# Patient Record
Sex: Male | Born: 1935 | Race: White | Hispanic: No | Marital: Single | Smoking: Former smoker
Health system: Southern US, Community
[De-identification: ages and names within clinical notes are randomized; demographics above are authoritative.]

## PROBLEM LIST (undated history)

## (undated) DIAGNOSIS — K6289 Other specified diseases of anus and rectum: Secondary | ICD-10-CM

## (undated) DIAGNOSIS — N183 Chronic kidney disease, stage 3 unspecified: Secondary | ICD-10-CM

## (undated) DIAGNOSIS — I2510 Atherosclerotic heart disease of native coronary artery without angina pectoris: Secondary | ICD-10-CM

## (undated) DIAGNOSIS — I491 Atrial premature depolarization: Secondary | ICD-10-CM

## (undated) DIAGNOSIS — K2270 Barrett's esophagus without dysplasia: Secondary | ICD-10-CM

## (undated) DIAGNOSIS — Z85038 Personal history of other malignant neoplasm of large intestine: Secondary | ICD-10-CM

## (undated) DIAGNOSIS — Z8719 Personal history of other diseases of the digestive system: Secondary | ICD-10-CM

## (undated) DIAGNOSIS — I252 Old myocardial infarction: Secondary | ICD-10-CM

## (undated) DIAGNOSIS — Z955 Presence of coronary angioplasty implant and graft: Secondary | ICD-10-CM

## (undated) DIAGNOSIS — K5900 Constipation, unspecified: Secondary | ICD-10-CM

## (undated) DIAGNOSIS — D539 Nutritional anemia, unspecified: Secondary | ICD-10-CM

## (undated) DIAGNOSIS — F32A Depression, unspecified: Secondary | ICD-10-CM

## (undated) DIAGNOSIS — Z973 Presence of spectacles and contact lenses: Secondary | ICD-10-CM

## (undated) DIAGNOSIS — Z87442 Personal history of urinary calculi: Secondary | ICD-10-CM

## (undated) DIAGNOSIS — I208 Other forms of angina pectoris: Secondary | ICD-10-CM

## (undated) DIAGNOSIS — I10 Essential (primary) hypertension: Secondary | ICD-10-CM

## (undated) DIAGNOSIS — I480 Paroxysmal atrial fibrillation: Secondary | ICD-10-CM

## (undated) DIAGNOSIS — F329 Major depressive disorder, single episode, unspecified: Secondary | ICD-10-CM

## (undated) DIAGNOSIS — I350 Nonrheumatic aortic (valve) stenosis: Secondary | ICD-10-CM

## (undated) DIAGNOSIS — E785 Hyperlipidemia, unspecified: Secondary | ICD-10-CM

## (undated) DIAGNOSIS — I2089 Other forms of angina pectoris: Secondary | ICD-10-CM

## (undated) DIAGNOSIS — I493 Ventricular premature depolarization: Secondary | ICD-10-CM

## (undated) DIAGNOSIS — I5022 Chronic systolic (congestive) heart failure: Secondary | ICD-10-CM

## (undated) DIAGNOSIS — I4510 Unspecified right bundle-branch block: Secondary | ICD-10-CM

## (undated) DIAGNOSIS — E119 Type 2 diabetes mellitus without complications: Secondary | ICD-10-CM

## (undated) HISTORY — PX: TRANSTHORACIC ECHOCARDIOGRAM: SHX275

## (undated) HISTORY — PX: CATARACT EXTRACTION W/ INTRAOCULAR LENS  IMPLANT, BILATERAL: SHX1307

## (undated) HISTORY — DX: Hyperlipidemia, unspecified: E78.5

## (undated) HISTORY — DX: Nutritional anemia, unspecified: D53.9

## (undated) HISTORY — DX: Atherosclerotic heart disease of native coronary artery without angina pectoris: I25.10

## (undated) HISTORY — PX: CORONARY ANGIOPLASTY WITH STENT PLACEMENT: SHX49

## (undated) HISTORY — PX: CARDIAC CATHETERIZATION: SHX172

## (undated) HISTORY — PX: LAPAROSCOPIC CHOLECYSTECTOMY: SUR755

## (undated) HISTORY — DX: Essential (primary) hypertension: I10

## (undated) HISTORY — PX: CARDIOVASCULAR STRESS TEST: SHX262

## (undated) HISTORY — DX: Barrett's esophagus without dysplasia: K22.70

## (undated) HISTORY — PX: TONSILLECTOMY: SUR1361

---

## 1991-03-11 HISTORY — PX: RIGHT COLECTOMY: SHX853

## 1991-11-08 HISTORY — PX: ABDOMINAL MASS RESECTION: SHX1110

## 1997-09-27 ENCOUNTER — Other Ambulatory Visit: Admission: RE | Admit: 1997-09-27 | Discharge: 1997-09-27 | Payer: Self-pay | Admitting: Cardiovascular Disease

## 1997-12-04 ENCOUNTER — Ambulatory Visit (HOSPITAL_COMMUNITY): Admission: RE | Admit: 1997-12-04 | Discharge: 1997-12-04 | Payer: Self-pay | Admitting: Urology

## 1998-05-19 ENCOUNTER — Ambulatory Visit (HOSPITAL_COMMUNITY): Admission: RE | Admit: 1998-05-19 | Discharge: 1998-05-19 | Payer: Self-pay | Admitting: Gastroenterology

## 1998-10-16 ENCOUNTER — Emergency Department (HOSPITAL_COMMUNITY): Admission: EM | Admit: 1998-10-16 | Discharge: 1998-10-17 | Payer: Self-pay

## 1999-06-22 ENCOUNTER — Encounter: Admission: RE | Admit: 1999-06-22 | Discharge: 1999-06-22 | Payer: Self-pay | Admitting: Oncology

## 1999-06-22 ENCOUNTER — Encounter: Payer: Self-pay | Admitting: Oncology

## 1999-12-14 ENCOUNTER — Encounter: Payer: Self-pay | Admitting: Oncology

## 1999-12-14 ENCOUNTER — Encounter: Admission: RE | Admit: 1999-12-14 | Discharge: 1999-12-14 | Payer: Self-pay | Admitting: Oncology

## 2002-01-11 ENCOUNTER — Encounter: Payer: Self-pay | Admitting: Emergency Medicine

## 2002-01-11 ENCOUNTER — Emergency Department (HOSPITAL_COMMUNITY): Admission: EM | Admit: 2002-01-11 | Discharge: 2002-01-11 | Payer: Self-pay | Admitting: Emergency Medicine

## 2002-01-12 ENCOUNTER — Emergency Department (HOSPITAL_COMMUNITY): Admission: EM | Admit: 2002-01-12 | Discharge: 2002-01-12 | Payer: Self-pay

## 2002-01-14 ENCOUNTER — Emergency Department (HOSPITAL_COMMUNITY): Admission: EM | Admit: 2002-01-14 | Discharge: 2002-01-14 | Payer: Self-pay | Admitting: *Deleted

## 2002-01-18 ENCOUNTER — Encounter: Payer: Self-pay | Admitting: Urology

## 2002-01-18 ENCOUNTER — Observation Stay (HOSPITAL_COMMUNITY): Admission: RE | Admit: 2002-01-18 | Discharge: 2002-01-19 | Payer: Self-pay | Admitting: Urology

## 2002-07-12 ENCOUNTER — Encounter: Payer: Self-pay | Admitting: Urology

## 2002-07-12 ENCOUNTER — Observation Stay (HOSPITAL_COMMUNITY): Admission: AD | Admit: 2002-07-12 | Discharge: 2002-07-13 | Payer: Self-pay | Admitting: Urology

## 2002-07-13 ENCOUNTER — Encounter: Payer: Self-pay | Admitting: Urology

## 2002-07-18 ENCOUNTER — Ambulatory Visit (HOSPITAL_COMMUNITY): Admission: RE | Admit: 2002-07-18 | Discharge: 2002-07-18 | Payer: Self-pay | Admitting: Gastroenterology

## 2002-10-18 ENCOUNTER — Observation Stay (HOSPITAL_COMMUNITY): Admission: EM | Admit: 2002-10-18 | Discharge: 2002-10-19 | Payer: Self-pay | Admitting: Urology

## 2002-10-18 ENCOUNTER — Encounter: Payer: Self-pay | Admitting: Urology

## 2002-10-18 HISTORY — PX: OTHER SURGICAL HISTORY: SHX169

## 2003-06-12 ENCOUNTER — Inpatient Hospital Stay (HOSPITAL_COMMUNITY): Admission: EM | Admit: 2003-06-12 | Discharge: 2003-06-15 | Payer: Self-pay | Admitting: Emergency Medicine

## 2003-07-02 ENCOUNTER — Emergency Department (HOSPITAL_COMMUNITY): Admission: EM | Admit: 2003-07-02 | Discharge: 2003-07-02 | Payer: Self-pay | Admitting: Emergency Medicine

## 2003-07-09 ENCOUNTER — Encounter (INDEPENDENT_AMBULATORY_CARE_PROVIDER_SITE_OTHER): Payer: Self-pay | Admitting: Specialist

## 2003-07-09 ENCOUNTER — Inpatient Hospital Stay (HOSPITAL_COMMUNITY): Admission: RE | Admit: 2003-07-09 | Discharge: 2003-07-16 | Payer: Self-pay | Admitting: General Surgery

## 2003-07-09 HISTORY — PX: OTHER SURGICAL HISTORY: SHX169

## 2004-06-09 ENCOUNTER — Ambulatory Visit: Payer: Self-pay | Admitting: Oncology

## 2004-08-26 ENCOUNTER — Observation Stay (HOSPITAL_COMMUNITY): Admission: EM | Admit: 2004-08-26 | Discharge: 2004-08-27 | Payer: Self-pay | Admitting: Emergency Medicine

## 2005-03-05 ENCOUNTER — Encounter: Admission: RE | Admit: 2005-03-05 | Discharge: 2005-03-05 | Payer: Self-pay | Admitting: Internal Medicine

## 2005-06-15 ENCOUNTER — Ambulatory Visit: Payer: Self-pay | Admitting: Oncology

## 2005-10-18 ENCOUNTER — Ambulatory Visit: Payer: Self-pay | Admitting: Oncology

## 2005-10-20 LAB — CBC WITH DIFFERENTIAL/PLATELET
BASO%: 0.9 % (ref 0.0–2.0)
Eosinophils Absolute: 0.2 10*3/uL (ref 0.0–0.5)
LYMPH%: 15.5 % (ref 14.0–48.0)
MCHC: 34.5 g/dL (ref 32.0–35.9)
MCV: 103.1 fL — ABNORMAL HIGH (ref 81.6–98.0)
MONO#: 0.6 10*3/uL (ref 0.1–0.9)
MONO%: 10.3 % (ref 0.0–13.0)
NEUT#: 3.7 10*3/uL (ref 1.5–6.5)
RBC: 3.45 10*6/uL — ABNORMAL LOW (ref 4.20–5.71)
RDW: 13.3 % (ref 11.2–14.6)
WBC: 5.4 10*3/uL (ref 4.0–10.0)

## 2005-10-20 LAB — MORPHOLOGY: PLT EST: ADEQUATE

## 2005-12-01 LAB — CBC WITH DIFFERENTIAL/PLATELET
BASO%: 0.6 % (ref 0.0–2.0)
EOS%: 5 % (ref 0.0–7.0)
MCH: 35.7 pg — ABNORMAL HIGH (ref 28.0–33.4)
MCHC: 34.8 g/dL (ref 32.0–35.9)
MONO#: 0.3 10*3/uL (ref 0.1–0.9)
RBC: 3.39 10*6/uL — ABNORMAL LOW (ref 4.20–5.71)
WBC: 4.2 10*3/uL (ref 4.0–10.0)
lymph#: 0.7 10*3/uL — ABNORMAL LOW (ref 0.9–3.3)

## 2005-12-01 LAB — MORPHOLOGY: PLT EST: ADEQUATE

## 2005-12-01 LAB — CHCC SMEAR

## 2005-12-02 LAB — VITAMIN B12: Vitamin B-12: 315 pg/mL (ref 211–911)

## 2006-01-21 ENCOUNTER — Emergency Department (HOSPITAL_COMMUNITY): Admission: EM | Admit: 2006-01-21 | Discharge: 2006-01-21 | Payer: Self-pay | Admitting: Emergency Medicine

## 2006-06-09 ENCOUNTER — Ambulatory Visit: Payer: Self-pay | Admitting: Oncology

## 2006-06-14 LAB — CBC WITH DIFFERENTIAL/PLATELET
Basophils Absolute: 0 10*3/uL (ref 0.0–0.1)
HCT: 34.5 % — ABNORMAL LOW (ref 38.7–49.9)
HGB: 12 g/dL — ABNORMAL LOW (ref 13.0–17.1)
MONO#: 0.4 10*3/uL (ref 0.1–0.9)
NEUT%: 64.4 % (ref 40.0–75.0)
WBC: 4 10*3/uL (ref 4.0–10.0)
lymph#: 0.8 10*3/uL — ABNORMAL LOW (ref 0.9–3.3)

## 2006-06-14 LAB — COMPREHENSIVE METABOLIC PANEL
ALT: 12 U/L (ref 0–53)
BUN: 22 mg/dL (ref 6–23)
CO2: 24 mEq/L (ref 19–32)
Calcium: 9.3 mg/dL (ref 8.4–10.5)
Chloride: 107 mEq/L (ref 96–112)
Creatinine, Ser: 1.07 mg/dL (ref 0.40–1.50)

## 2006-06-14 LAB — LACTATE DEHYDROGENASE: LDH: 152 U/L (ref 94–250)

## 2006-06-14 LAB — PSA: PSA: 2.87 ng/mL (ref 0.10–4.00)

## 2006-12-15 ENCOUNTER — Ambulatory Visit: Payer: Self-pay | Admitting: Oncology

## 2006-12-20 LAB — MORPHOLOGY: PLT EST: ADEQUATE

## 2006-12-20 LAB — CBC & DIFF AND RETIC
Basophils Absolute: 0 10*3/uL (ref 0.0–0.1)
EOS%: 3.1 % (ref 0.0–7.0)
Eosinophils Absolute: 0.2 10*3/uL (ref 0.0–0.5)
HGB: 13.3 g/dL (ref 13.0–17.1)
LYMPH%: 15.5 % (ref 14.0–48.0)
MCH: 35.7 pg — ABNORMAL HIGH (ref 28.0–33.4)
MCV: 101.5 fL — ABNORMAL HIGH (ref 81.6–98.0)
MONO%: 8.1 % (ref 0.0–13.0)
NEUT#: 4 10*3/uL (ref 1.5–6.5)
Platelets: 178 10*3/uL (ref 145–400)
RDW: 13.7 % (ref 11.2–14.6)

## 2007-04-30 ENCOUNTER — Emergency Department (HOSPITAL_COMMUNITY): Admission: EM | Admit: 2007-04-30 | Discharge: 2007-05-01 | Payer: Self-pay | Admitting: Emergency Medicine

## 2007-06-09 ENCOUNTER — Ambulatory Visit: Payer: Self-pay | Admitting: Oncology

## 2007-06-14 LAB — LACTATE DEHYDROGENASE: LDH: 184 U/L (ref 94–250)

## 2007-06-14 LAB — COMPREHENSIVE METABOLIC PANEL
ALT: 22 U/L (ref 0–53)
Albumin: 4.4 g/dL (ref 3.5–5.2)
CO2: 23 mEq/L (ref 19–32)
Calcium: 9.2 mg/dL (ref 8.4–10.5)
Chloride: 101 mEq/L (ref 96–112)
Creatinine, Ser: 0.96 mg/dL (ref 0.40–1.50)
Potassium: 3.7 mEq/L (ref 3.5–5.3)
Total Protein: 7.2 g/dL (ref 6.0–8.3)

## 2007-06-14 LAB — CBC & DIFF AND RETIC
BASO%: 0.5 % (ref 0.0–2.0)
EOS%: 5.2 % (ref 0.0–7.0)
HCT: 39.7 % (ref 38.7–49.9)
IRF: 0.39 — ABNORMAL HIGH (ref 0.070–0.380)
MCH: 33.8 pg — ABNORMAL HIGH (ref 28.0–33.4)
MCHC: 33.8 g/dL (ref 32.0–35.9)
MONO%: 7 % (ref 0.0–13.0)
NEUT%: 70.8 % (ref 40.0–75.0)
RDW: 13.4 % (ref 11.2–14.6)
lymph#: 0.8 10*3/uL — ABNORMAL LOW (ref 0.9–3.3)

## 2007-06-14 LAB — MORPHOLOGY: PLT EST: ADEQUATE

## 2008-06-03 ENCOUNTER — Ambulatory Visit (HOSPITAL_COMMUNITY): Admission: RE | Admit: 2008-06-03 | Discharge: 2008-06-03 | Payer: Self-pay | Admitting: Oncology

## 2008-06-03 ENCOUNTER — Ambulatory Visit: Payer: Self-pay | Admitting: Oncology

## 2008-06-11 LAB — CBC WITH DIFFERENTIAL/PLATELET
Eosinophils Absolute: 0.4 10*3/uL (ref 0.0–0.5)
MONO#: 0.6 10*3/uL (ref 0.1–0.9)
NEUT#: 4.9 10*3/uL (ref 1.5–6.5)
Platelets: 194 10*3/uL (ref 145–400)
RBC: 4.42 10*6/uL (ref 4.20–5.71)
RDW: 12.8 % (ref 11.2–14.6)
WBC: 7.1 10*3/uL (ref 4.0–10.0)

## 2008-06-11 LAB — COMPREHENSIVE METABOLIC PANEL
Albumin: 4.2 g/dL (ref 3.5–5.2)
CO2: 28 mEq/L (ref 19–32)
Chloride: 101 mEq/L (ref 96–112)
Glucose, Bld: 118 mg/dL — ABNORMAL HIGH (ref 70–99)
Potassium: 4.3 mEq/L (ref 3.5–5.3)
Sodium: 141 mEq/L (ref 135–145)
Total Protein: 7.1 g/dL (ref 6.0–8.3)

## 2008-06-11 LAB — LACTATE DEHYDROGENASE: LDH: 156 U/L (ref 94–250)

## 2008-06-11 LAB — CEA: CEA: 1.1 ng/mL (ref 0.0–5.0)

## 2009-06-06 ENCOUNTER — Ambulatory Visit: Payer: Self-pay | Admitting: Oncology

## 2009-06-10 LAB — COMPREHENSIVE METABOLIC PANEL
ALT: 24 U/L (ref 0–53)
AST: 27 U/L (ref 0–37)
Albumin: 3.9 g/dL (ref 3.5–5.2)
Glucose, Bld: 213 mg/dL — ABNORMAL HIGH (ref 70–99)
Potassium: 3.8 mEq/L (ref 3.5–5.3)
Total Protein: 7.1 g/dL (ref 6.0–8.3)

## 2009-06-10 LAB — CBC WITH DIFFERENTIAL/PLATELET
BASO%: 0.5 % (ref 0.0–2.0)
Basophils Absolute: 0 10*3/uL (ref 0.0–0.1)
EOS%: 3.7 % (ref 0.0–7.0)
Eosinophils Absolute: 0.2 10*3/uL (ref 0.0–0.5)
LYMPH%: 19.1 % (ref 14.0–49.0)
MCV: 102.2 fL — ABNORMAL HIGH (ref 79.3–98.0)
MONO#: 0.5 10*3/uL (ref 0.1–0.9)
MONO%: 8.1 % (ref 0.0–14.0)
NEUT%: 68.6 % (ref 39.0–75.0)
RDW: 12.8 % (ref 11.0–14.6)
lymph#: 1.2 10*3/uL (ref 0.9–3.3)

## 2009-06-10 LAB — LACTATE DEHYDROGENASE: LDH: 142 U/L (ref 94–250)

## 2009-10-31 ENCOUNTER — Encounter: Admission: RE | Admit: 2009-10-31 | Discharge: 2009-10-31 | Payer: Self-pay | Admitting: Internal Medicine

## 2009-11-25 ENCOUNTER — Ambulatory Visit (HOSPITAL_COMMUNITY)
Admission: RE | Admit: 2009-11-25 | Discharge: 2009-11-25 | Payer: Self-pay | Source: Home / Self Care | Admitting: Surgery

## 2009-12-04 ENCOUNTER — Encounter (INDEPENDENT_AMBULATORY_CARE_PROVIDER_SITE_OTHER): Payer: Self-pay | Admitting: Surgery

## 2009-12-04 ENCOUNTER — Ambulatory Visit (HOSPITAL_COMMUNITY): Admission: RE | Admit: 2009-12-04 | Discharge: 2009-12-05 | Payer: Self-pay | Admitting: Surgery

## 2009-12-12 ENCOUNTER — Ambulatory Visit: Payer: Self-pay | Admitting: Oncology

## 2009-12-16 LAB — CBC WITH DIFFERENTIAL/PLATELET
BASO%: 0.7 % (ref 0.0–2.0)
EOS%: 6.6 % (ref 0.0–7.0)
Eosinophils Absolute: 0.4 10*3/uL (ref 0.0–0.5)
HCT: 36.5 % — ABNORMAL LOW (ref 38.4–49.9)
HGB: 12.8 g/dL — ABNORMAL LOW (ref 13.0–17.1)
LYMPH%: 11.1 % — ABNORMAL LOW (ref 14.0–49.0)
MCH: 35.4 pg — ABNORMAL HIGH (ref 27.2–33.4)
MCHC: 35 g/dL (ref 32.0–36.0)
MONO#: 0.5 10*3/uL (ref 0.1–0.9)
MONO%: 8.5 % (ref 0.0–14.0)
NEUT%: 73.1 % (ref 39.0–75.0)
Platelets: 202 10*3/uL (ref 140–400)

## 2009-12-16 LAB — CEA: CEA: 1.4 ng/mL (ref 0.0–5.0)

## 2009-12-23 ENCOUNTER — Ambulatory Visit (HOSPITAL_COMMUNITY): Admission: RE | Admit: 2009-12-23 | Discharge: 2009-12-23 | Payer: Self-pay | Admitting: Oncology

## 2010-01-22 ENCOUNTER — Ambulatory Visit: Payer: Self-pay | Admitting: Cardiovascular Disease

## 2010-01-22 ENCOUNTER — Inpatient Hospital Stay (HOSPITAL_COMMUNITY): Admission: EM | Admit: 2010-01-22 | Discharge: 2010-01-23 | Payer: Self-pay | Admitting: Internal Medicine

## 2010-01-29 ENCOUNTER — Ambulatory Visit (HOSPITAL_BASED_OUTPATIENT_CLINIC_OR_DEPARTMENT_OTHER): Payer: MEDICARE | Admitting: Oncology

## 2010-02-02 LAB — CBC & DIFF AND RETIC
BASO%: 0.4 % (ref 0.0–2.0)
Basophils Absolute: 0 10*3/uL (ref 0.0–0.1)
Eosinophils Absolute: 0.4 10*3/uL (ref 0.0–0.5)
HCT: 36.8 % — ABNORMAL LOW (ref 38.4–49.9)
Immature Retic Fract: 10.5 % (ref 0.00–13.40)
MCH: 34.2 pg — ABNORMAL HIGH (ref 27.2–33.4)
MCHC: 33.7 g/dL (ref 32.0–36.0)
MONO%: 7.4 % (ref 0.0–14.0)
NEUT%: 71.2 % (ref 39.0–75.0)
Platelets: 176 10*3/uL (ref 140–400)
Retic %: 1.21 % (ref 0.50–1.60)
Retic Ct Abs: 43.92 10*3/uL (ref 24.10–77.50)
WBC: 5.5 10*3/uL (ref 4.0–10.3)
nRBC: 0 % (ref 0–0)

## 2010-02-02 LAB — MORPHOLOGY

## 2010-02-04 LAB — IMMUNOFIXATION ELECTROPHORESIS
IgG (Immunoglobin G), Serum: 1030 mg/dL (ref 694–1618)
IgM, Serum: 97 mg/dL (ref 60–263)
Total Protein, Serum Electrophoresis: 6.6 g/dL (ref 6.0–8.3)

## 2010-02-04 LAB — FERRITIN: Ferritin: 100 ng/mL (ref 22–322)

## 2010-02-04 LAB — IRON AND TIBC: %SAT: 33 % (ref 20–55)

## 2010-02-13 ENCOUNTER — Ambulatory Visit: Payer: Self-pay | Admitting: Cardiovascular Disease

## 2010-04-20 ENCOUNTER — Encounter
Admission: RE | Admit: 2010-04-20 | Discharge: 2010-04-20 | Payer: Self-pay | Source: Home / Self Care | Attending: Internal Medicine | Admitting: Internal Medicine

## 2010-04-29 ENCOUNTER — Encounter
Admission: RE | Admit: 2010-04-29 | Discharge: 2010-04-29 | Payer: Self-pay | Source: Home / Self Care | Attending: Internal Medicine | Admitting: Internal Medicine

## 2010-06-11 ENCOUNTER — Encounter: Payer: MEDICARE | Admitting: Oncology

## 2010-06-11 DIAGNOSIS — Z85038 Personal history of other malignant neoplasm of large intestine: Secondary | ICD-10-CM

## 2010-06-11 DIAGNOSIS — C61 Malignant neoplasm of prostate: Secondary | ICD-10-CM

## 2010-06-11 LAB — CBC WITH DIFFERENTIAL/PLATELET
BASO%: 0.5 % (ref 0.0–2.0)
EOS%: 4.4 % (ref 0.0–7.0)
Eosinophils Absolute: 0.2 10*3/uL (ref 0.0–0.5)
HGB: 12.8 g/dL — ABNORMAL LOW (ref 13.0–17.1)
MCH: 35.2 pg — ABNORMAL HIGH (ref 27.2–33.4)
MCHC: 34.4 g/dL (ref 32.0–36.0)
MONO#: 0.5 10*3/uL (ref 0.1–0.9)
NEUT#: 3.5 10*3/uL (ref 1.5–6.5)
NEUT%: 66.6 % (ref 39.0–75.0)
WBC: 5.2 10*3/uL (ref 4.0–10.3)
lymph#: 1 10*3/uL (ref 0.9–3.3)

## 2010-06-11 LAB — COMPREHENSIVE METABOLIC PANEL
Alkaline Phosphatase: 41 U/L (ref 39–117)
BUN: 29 mg/dL — ABNORMAL HIGH (ref 6–23)
Calcium: 9.6 mg/dL (ref 8.4–10.5)
Glucose, Bld: 216 mg/dL — ABNORMAL HIGH (ref 70–99)
Total Bilirubin: 0.7 mg/dL (ref 0.3–1.2)

## 2010-06-16 ENCOUNTER — Encounter (HOSPITAL_BASED_OUTPATIENT_CLINIC_OR_DEPARTMENT_OTHER): Payer: MEDICARE | Admitting: Oncology

## 2010-06-16 DIAGNOSIS — D539 Nutritional anemia, unspecified: Secondary | ICD-10-CM

## 2010-06-16 DIAGNOSIS — R634 Abnormal weight loss: Secondary | ICD-10-CM

## 2010-06-16 DIAGNOSIS — Z85038 Personal history of other malignant neoplasm of large intestine: Secondary | ICD-10-CM

## 2010-06-22 ENCOUNTER — Other Ambulatory Visit: Payer: Self-pay | Admitting: Oncology

## 2010-06-25 LAB — UIFE/LIGHT CHAINS/TP QN, 24-HR UR
Free Kappa Lt Chains,Ur: 1.61 mg/dL — ABNORMAL HIGH (ref 0.04–1.51)
Free Lambda Excretion/Day: 1.63 mg/d
Free Lt Chn Excr Rate: 20.13 mg/d
Total Protein, Urine-Ur/day: 31 mg/d (ref 10–140)
Total Protein, Urine: 2.5 mg/dL
Volume, Urine: 1250 mL

## 2010-06-25 LAB — CREATININE CLEARANCE, URINE, 24 HOUR
Collection Interval-CRCL: 24 hours
Creatinine, 24H Ur: 1048 mg/d (ref 800–2000)
Creatinine, Urine: 83.8 mg/dL
Creatinine: 1.54 mg/dL — ABNORMAL HIGH (ref 0.40–1.50)

## 2010-07-23 LAB — GLUCOSE, CAPILLARY
Glucose-Capillary: 131 mg/dL — ABNORMAL HIGH (ref 70–99)
Glucose-Capillary: 96 mg/dL (ref 70–99)

## 2010-07-23 LAB — PROTIME-INR
INR: 1.05 (ref 0.00–1.49)
Prothrombin Time: 13.9 seconds (ref 11.6–15.2)

## 2010-07-23 LAB — URINALYSIS, DIPSTICK ONLY
Hgb urine dipstick: NEGATIVE
Leukocytes, UA: NEGATIVE
Protein, ur: NEGATIVE mg/dL
Urobilinogen, UA: 0.2 mg/dL (ref 0.0–1.0)

## 2010-07-23 LAB — CBC
HCT: 35.5 % — ABNORMAL LOW (ref 39.0–52.0)
Hemoglobin: 12.1 g/dL — ABNORMAL LOW (ref 13.0–17.0)
MCH: 34.7 pg — ABNORMAL HIGH (ref 26.0–34.0)
MCV: 101.7 fL — ABNORMAL HIGH (ref 78.0–100.0)
RDW: 13.3 % (ref 11.5–15.5)

## 2010-07-23 LAB — COMPREHENSIVE METABOLIC PANEL
AST: 28 U/L (ref 0–37)
Albumin: 3.6 g/dL (ref 3.5–5.2)
Calcium: 9.1 mg/dL (ref 8.4–10.5)
Creatinine, Ser: 1.38 mg/dL (ref 0.4–1.5)
GFR calc Af Amer: 59 mL/min — ABNORMAL LOW (ref >60)
Total Protein: 6.3 g/dL (ref 6.0–8.3)

## 2010-07-23 LAB — BASIC METABOLIC PANEL
BUN: 23 mg/dL (ref 6–23)
GFR calc non Af Amer: 58 mL/min — ABNORMAL LOW (ref >60)
Glucose, Bld: 110 mg/dL — ABNORMAL HIGH (ref 70–99)
Potassium: 4.7 mEq/L (ref 3.5–5.1)

## 2010-07-23 LAB — TROPONIN I
Troponin I: 0.04 ng/mL (ref 0.00–0.06)
Troponin I: 0.04 ng/mL (ref 0.00–0.06)

## 2010-07-23 LAB — CK TOTAL AND CKMB (NOT AT ARMC)
CK, MB: 2.3 ng/mL (ref 0.3–4.0)
Relative Index: 1.3 (ref 0.0–2.5)

## 2010-07-23 LAB — CARDIAC PANEL(CRET KIN+CKTOT+MB+TROPI)
CK, MB: 2.8 ng/mL (ref 0.3–4.0)
Relative Index: 1.8 (ref 0.0–2.5)

## 2010-07-23 LAB — APTT: aPTT: 30 seconds (ref 24–37)

## 2010-07-25 LAB — URINALYSIS, ROUTINE W REFLEX MICROSCOPIC
Glucose, UA: >1000 mg/dL — AB
Hgb urine dipstick: NEGATIVE
Leukocytes, UA: NEGATIVE
Specific Gravity, Urine: 1.021 (ref 1.005–1.030)
Urobilinogen, UA: 0.2 mg/dL (ref 0.0–1.0)

## 2010-07-25 LAB — DIFFERENTIAL
Lymphocytes Relative: 14 % (ref 12–46)
Monocytes Absolute: 0.4 10*3/uL (ref 0.1–1.0)
Monocytes Relative: 6 % (ref 3–12)
Neutro Abs: 4.5 10*3/uL (ref 1.7–7.7)

## 2010-07-25 LAB — CBC
MCH: 35.8 pg — ABNORMAL HIGH (ref 26.0–34.0)
MCHC: 34.6 g/dL (ref 30.0–36.0)
MCV: 103.4 fL — ABNORMAL HIGH (ref 78.0–100.0)
Platelets: 172 10*3/uL (ref 150–400)
RDW: 12.9 % (ref 11.5–15.5)
WBC: 6.3 10*3/uL (ref 4.0–10.5)

## 2010-07-25 LAB — COMPREHENSIVE METABOLIC PANEL
Albumin: 3.7 g/dL (ref 3.5–5.2)
BUN: 20 mg/dL (ref 6–23)
Calcium: 9.5 mg/dL (ref 8.4–10.5)
Creatinine, Ser: 1.22 mg/dL (ref 0.4–1.5)
Total Protein: 6.7 g/dL (ref 6.0–8.3)

## 2010-07-25 LAB — URINE MICROSCOPIC-ADD ON

## 2010-07-25 LAB — GLUCOSE, CAPILLARY
Glucose-Capillary: 120 mg/dL — ABNORMAL HIGH (ref 70–99)
Glucose-Capillary: 127 mg/dL — ABNORMAL HIGH (ref 70–99)
Glucose-Capillary: 80 mg/dL (ref 70–99)

## 2010-07-25 LAB — BLOOD GAS, ARTERIAL
Acid-base deficit: 4.2 mmol/L — ABNORMAL HIGH (ref 0.0–2.0)
Drawn by: 277341
Inspiratory PAP: 10
pCO2 arterial: 47.1 mmHg — ABNORMAL HIGH (ref 35.0–45.0)
pH, Arterial: 7.28 — ABNORMAL LOW (ref 7.350–7.450)
pO2, Arterial: 94.1 mmHg (ref 80.0–100.0)

## 2010-07-26 LAB — SURGICAL PCR SCREEN
MRSA, PCR: NEGATIVE
Staphylococcus aureus: NEGATIVE

## 2010-07-26 LAB — URINALYSIS, ROUTINE W REFLEX MICROSCOPIC
Bilirubin Urine: NEGATIVE
Nitrite: NEGATIVE
Specific Gravity, Urine: 1.022 (ref 1.005–1.030)
Urobilinogen, UA: 0.2 mg/dL (ref 0.0–1.0)
pH: 5.5 (ref 5.0–8.0)

## 2010-07-26 LAB — DIFFERENTIAL
Basophils Relative: 1 % (ref 0–1)
Lymphocytes Relative: 14 % (ref 12–46)
Lymphs Abs: 0.8 10*3/uL (ref 0.7–4.0)
Monocytes Absolute: 0.5 10*3/uL (ref 0.1–1.0)
Monocytes Relative: 9 % (ref 3–12)
Neutro Abs: 4.4 10*3/uL (ref 1.7–7.7)
Neutrophils Relative %: 72 % (ref 43–77)

## 2010-07-26 LAB — CBC
MCHC: 34.5 g/dL (ref 30.0–36.0)
MCV: 103.6 fL — ABNORMAL HIGH (ref 78.0–100.0)
Platelets: 163 10*3/uL (ref 150–400)
RDW: 13.1 % (ref 11.5–15.5)
WBC: 6.1 10*3/uL (ref 4.0–10.5)

## 2010-07-26 LAB — COMPREHENSIVE METABOLIC PANEL
Albumin: 3.9 g/dL (ref 3.5–5.2)
Alkaline Phosphatase: 43 U/L (ref 39–117)
BUN: 28 mg/dL — ABNORMAL HIGH (ref 6–23)
Calcium: 9.6 mg/dL (ref 8.4–10.5)
Glucose, Bld: 244 mg/dL — ABNORMAL HIGH (ref 70–99)
Potassium: 5 mEq/L (ref 3.5–5.1)
Total Protein: 7 g/dL (ref 6.0–8.3)

## 2010-07-28 ENCOUNTER — Encounter: Payer: Self-pay | Admitting: Cardiovascular Disease

## 2010-07-28 ENCOUNTER — Ambulatory Visit
Admission: RE | Admit: 2010-07-28 | Discharge: 2010-07-28 | Disposition: A | Discharge status: Home / Self Care | Payer: MEDICARE | Source: Ambulatory Visit | Attending: Cardiology | Admitting: Cardiology

## 2010-07-28 ENCOUNTER — Encounter: Payer: Self-pay | Admitting: Cardiology

## 2010-07-28 ENCOUNTER — Other Ambulatory Visit: Payer: Self-pay | Admitting: Cardiology

## 2010-07-28 ENCOUNTER — Ambulatory Visit (INDEPENDENT_AMBULATORY_CARE_PROVIDER_SITE_OTHER): Payer: MEDICARE | Admitting: Cardiology

## 2010-07-28 DIAGNOSIS — Z79899 Other long term (current) drug therapy: Secondary | ICD-10-CM

## 2010-07-28 DIAGNOSIS — E785 Hyperlipidemia, unspecified: Secondary | ICD-10-CM | POA: Insufficient documentation

## 2010-07-28 DIAGNOSIS — R0602 Shortness of breath: Secondary | ICD-10-CM

## 2010-07-28 DIAGNOSIS — I10 Essential (primary) hypertension: Secondary | ICD-10-CM | POA: Insufficient documentation

## 2010-07-28 DIAGNOSIS — R079 Chest pain, unspecified: Secondary | ICD-10-CM | POA: Insufficient documentation

## 2010-07-28 DIAGNOSIS — I2510 Atherosclerotic heart disease of native coronary artery without angina pectoris: Secondary | ICD-10-CM

## 2010-07-28 LAB — COMPREHENSIVE METABOLIC PANEL
ALT: 13 U/L (ref 0–53)
Albumin: 4.3 g/dL (ref 3.5–5.2)
Alkaline Phosphatase: 42 U/L (ref 39–117)
CO2: 21 mEq/L (ref 19–32)
Potassium: 4.6 mEq/L (ref 3.5–5.3)
Sodium: 139 mEq/L (ref 135–145)
Total Bilirubin: 0.5 mg/dL (ref 0.3–1.2)
Total Protein: 6.9 g/dL (ref 6.0–8.3)

## 2010-07-28 LAB — CBC WITH DIFFERENTIAL/PLATELET
Eosinophils Absolute: 0.2 10*3/uL (ref 0.0–0.7)
Hemoglobin: 12.6 g/dL — ABNORMAL LOW (ref 13.0–17.0)
Lymphs Abs: 1 10*3/uL (ref 0.7–4.0)
MCH: 34.1 pg — ABNORMAL HIGH (ref 26.0–34.0)
Monocytes Relative: 9 % (ref 3–12)
Neutro Abs: 4.5 10*3/uL (ref 1.7–7.7)
Neutrophils Relative %: 72 % (ref 43–77)
Platelets: 204 10*3/uL (ref 150–400)
RBC: 3.69 MIL/uL — ABNORMAL LOW (ref 4.22–5.81)
WBC: 6.2 10*3/uL (ref 4.0–10.5)

## 2010-07-28 NOTE — Progress Notes (Signed)
Subjective: [name] is seen for Dr. Elease Hashimoto today. He is complaining of exertional chest pain. It is located above his ribs. He has become more short of breath. He has had no symptoms at rest. He has not tried NTG. Symptoms have been progressive over the past week. He has had MRI and CT scan over the last 6 months with subsequent cholecystectomy. All of symptoms are below diaphraghm.  He has had hoarseness as well.  Current Outpatient Prescriptions  Medication Sig Dispense Refill  . aspirin 81 MG tablet Take 81 mg by mouth daily.        . Cholecalciferol (VITAMIN D-3 PO) Take by mouth daily.        [name] glimepiride (AMARYL) 4 MG tablet Take 4 mg by mouth daily before breakfast.        . lisinopril-hydrochlorothiazide (PRINZIDE,ZESTORETIC) 20-12.5 MG per tablet Take 1 tablet by mouth daily.        . metoprolol (TOPROL-XL) 50 MG 24 hr tablet Take 50 mg by mouth daily.        . Multiple Vitamin (MULTIVITAMIN) tablet Take 1 tablet by mouth daily.        . simvastatin (ZOCOR) 40 MG tablet Take 40 mg by mouth at bedtime.        [name] Glycol-Propyl Glycol (SYSTANE OP) Apply to eye 4 (four) times daily.          No Known Allergies  Patient Active Problem List  Diagnoses  . CAD (coronary artery disease)  . Hypertension  . Diabetes mellitus  . Hyperlipidemia  . Coronary artery disease    History  Smoking status  . Former Smoker  . Quit date: 05/10/1994  Smokeless tobacco  . Not on file    History  Alcohol Use No    Family History  Problem Relation Age of Onset  . Cancer Mother   . Ulcers Father     Review of Systems: As above. He denies edema. He is mostly now limited by his shortness of breath. No weight gain or weight loss.  The patient denies headaches or blurry vision. He has had some cough, but no real productive sputum.  The patient denies dizziness.  All other systems were reviewed and are negative.   Physical Exam: Vital signs are noted. He is pleasant and in no  acute distress. HEENT is unremarkable. Skin is warm and dry. Color is normal. Lungs are clear. Cardiac exam shows a regular rate and rhythm. He has frequent ectopics. He has no edema. Gait and ROM are intact. He has no gross neurologic deficits. He does sound a little hoarse today.  Assessment / Plan:

## 2010-07-28 NOTE — Assessment & Plan Note (Signed)
His pain is somewhat atypical.His last stress test dates back to 2003.  We will check labs, cxr and arrange for a Lexiscan. We will give him a prescription NTG to use prn. We will continue with his other meds. He will follow up with Dr. Elease Hashimoto after his stress test.

## 2010-07-29 LAB — BRAIN NATRIURETIC PEPTIDE: Brain Natriuretic Peptide: 48.1 pg/mL (ref 0.0–100.0)

## 2010-07-30 ENCOUNTER — Telehealth: Payer: Self-pay | Admitting: *Deleted

## 2010-07-30 NOTE — Telephone Encounter (Signed)
Informed pt regarding his lab work;  He is requesting his lexiscan be moved up if at all possible.  RN will contact [facility] tomorrow to see if this can be done.  I instructed pt I would call back in the AM if this can be done.

## 2010-08-01 ENCOUNTER — Inpatient Hospital Stay (HOSPITAL_COMMUNITY)
Admission: EM | Admit: 2010-08-01 | Discharge: 2010-08-06 | DRG: 247 | Disposition: A | Discharge status: Home / Self Care | Payer: Medicare Other | Attending: Cardiovascular Disease | Admitting: Cardiovascular Disease

## 2010-08-01 ENCOUNTER — Emergency Department (HOSPITAL_COMMUNITY): Payer: Medicare Other

## 2010-08-01 DIAGNOSIS — I2510 Atherosclerotic heart disease of native coronary artery without angina pectoris: Principal | ICD-10-CM | POA: Diagnosis present

## 2010-08-01 DIAGNOSIS — E119 Type 2 diabetes mellitus without complications: Secondary | ICD-10-CM | POA: Diagnosis present

## 2010-08-01 DIAGNOSIS — Z79899 Other long term (current) drug therapy: Secondary | ICD-10-CM

## 2010-08-01 DIAGNOSIS — K2270 Barrett's esophagus without dysplasia: Secondary | ICD-10-CM | POA: Diagnosis present

## 2010-08-01 DIAGNOSIS — I10 Essential (primary) hypertension: Secondary | ICD-10-CM | POA: Diagnosis present

## 2010-08-01 DIAGNOSIS — Z85038 Personal history of other malignant neoplasm of large intestine: Secondary | ICD-10-CM

## 2010-08-01 DIAGNOSIS — I200 Unstable angina: Secondary | ICD-10-CM | POA: Diagnosis present

## 2010-08-01 DIAGNOSIS — N289 Disorder of kidney and ureter, unspecified: Secondary | ICD-10-CM | POA: Diagnosis present

## 2010-08-01 DIAGNOSIS — Z7982 Long term (current) use of aspirin: Secondary | ICD-10-CM

## 2010-08-01 LAB — COMPREHENSIVE METABOLIC PANEL
AST: 24 U/L (ref 0–37)
Albumin: 3.5 g/dL (ref 3.5–5.2)
Calcium: 8.6 mg/dL (ref 8.4–10.5)
Chloride: 109 mEq/L (ref 96–112)
Creatinine, Ser: 1.35 mg/dL (ref 0.4–1.5)
GFR calc Af Amer: >60 mL/min (ref >60)
Total Bilirubin: 0.5 mg/dL (ref 0.3–1.2)
Total Protein: 6.3 g/dL (ref 6.0–8.3)

## 2010-08-01 LAB — POCT I-STAT, CHEM 8
BUN: 37 mg/dL — ABNORMAL HIGH (ref 6–23)
Calcium, Ion: 1.13 mmol/L (ref 1.12–1.32)
Creatinine, Ser: 1.6 mg/dL — ABNORMAL HIGH (ref 0.4–1.5)
Hemoglobin: 11.2 g/dL — ABNORMAL LOW (ref 13.0–17.0)
TCO2: 21 mmol/L (ref 0–100)

## 2010-08-01 LAB — CK TOTAL AND CKMB (NOT AT ARMC)
CK, MB: 2.7 ng/mL (ref 0.3–4.0)
Relative Index: 1.5 (ref 0.0–2.5)

## 2010-08-01 LAB — PROTIME-INR
INR: 1.17 (ref 0.00–1.49)
Prothrombin Time: 15.1 seconds (ref 11.6–15.2)

## 2010-08-01 LAB — CBC
MCH: 34.1 pg — ABNORMAL HIGH (ref 26.0–34.0)
MCHC: 34 g/dL (ref 30.0–36.0)
Platelets: 165 10*3/uL (ref 150–400)
RDW: 12.6 % (ref 11.5–15.5)

## 2010-08-01 LAB — POCT CARDIAC MARKERS
Troponin i, poc: <0.05 ng/mL (ref 0.00–0.09)
Troponin i, poc: <0.05 ng/mL (ref 0.00–0.09)

## 2010-08-01 LAB — MRSA PCR SCREENING: MRSA by PCR: NEGATIVE

## 2010-08-01 LAB — DIFFERENTIAL
Basophils Relative: 1 % (ref 0–1)
Eosinophils Absolute: 0.2 10*3/uL (ref 0.0–0.7)
Eosinophils Relative: 4 % (ref 0–5)
Monocytes Absolute: 0.7 10*3/uL (ref 0.1–1.0)
Monocytes Relative: 15 % — ABNORMAL HIGH (ref 3–12)

## 2010-08-01 LAB — APTT: aPTT: 104 seconds — ABNORMAL HIGH (ref 24–37)

## 2010-08-02 DIAGNOSIS — I200 Unstable angina: Secondary | ICD-10-CM

## 2010-08-02 LAB — GLUCOSE, CAPILLARY
Glucose-Capillary: 109 mg/dL — ABNORMAL HIGH (ref 70–99)
Glucose-Capillary: 229 mg/dL — ABNORMAL HIGH (ref 70–99)

## 2010-08-02 LAB — CARDIAC PANEL(CRET KIN+CKTOT+MB+TROPI)
CK, MB: 2.9 ng/mL (ref 0.3–4.0)
Relative Index: 1.2 (ref 0.0–2.5)
Total CK: 232 U/L (ref 7–232)
Total CK: 222 U/L (ref 7–232)
Troponin I: 0.03 ng/mL (ref 0.00–0.06)

## 2010-08-02 LAB — CBC
HCT: 33.4 % — ABNORMAL LOW (ref 39.0–52.0)
Hemoglobin: 11.4 g/dL — ABNORMAL LOW (ref 13.0–17.0)
RBC: 3.3 MIL/uL — ABNORMAL LOW (ref 4.22–5.81)
WBC: 4.8 10*3/uL (ref 4.0–10.5)

## 2010-08-02 LAB — LIPID PANEL: Cholesterol: 106 mg/dL (ref 0–200)

## 2010-08-02 LAB — HEPARIN LEVEL (UNFRACTIONATED): Heparin Unfractionated: 0.31 IU/mL (ref 0.30–0.70)

## 2010-08-03 DIAGNOSIS — I2510 Atherosclerotic heart disease of native coronary artery without angina pectoris: Secondary | ICD-10-CM

## 2010-08-03 LAB — GLUCOSE, CAPILLARY: Glucose-Capillary: 151 mg/dL — ABNORMAL HIGH (ref 70–99)

## 2010-08-03 LAB — BASIC METABOLIC PANEL
BUN: 20 mg/dL (ref 6–23)
Calcium: 8.7 mg/dL (ref 8.4–10.5)
GFR calc non Af Amer: 57 mL/min — ABNORMAL LOW (ref >60)
Glucose, Bld: 98 mg/dL (ref 70–99)
Potassium: 3.8 mEq/L (ref 3.5–5.1)

## 2010-08-03 LAB — CBC
MCH: 34.2 pg — ABNORMAL HIGH (ref 26.0–34.0)
MCV: 100.6 fL — ABNORMAL HIGH (ref 78.0–100.0)
Platelets: 157 10*3/uL (ref 150–400)
RBC: 3.39 MIL/uL — ABNORMAL LOW (ref 4.22–5.81)
RDW: 12.6 % (ref 11.5–15.5)
WBC: 5.4 10*3/uL (ref 4.0–10.5)

## 2010-08-03 LAB — HEPARIN LEVEL (UNFRACTIONATED): Heparin Unfractionated: 0.31 IU/mL (ref 0.30–0.70)

## 2010-08-03 LAB — BRAIN NATRIURETIC PEPTIDE: Pro B Natriuretic peptide (BNP): 99 pg/mL (ref 0.0–100.0)

## 2010-08-04 ENCOUNTER — Inpatient Hospital Stay (HOSPITAL_COMMUNITY): Payer: Medicare Other

## 2010-08-04 ENCOUNTER — Other Ambulatory Visit: Payer: Self-pay | Admitting: Gastroenterology

## 2010-08-04 ENCOUNTER — Encounter: Payer: Self-pay | Admitting: Cardiovascular Disease

## 2010-08-04 LAB — BASIC METABOLIC PANEL
BUN: 16 mg/dL (ref 6–23)
CO2: 24 mEq/L (ref 19–32)
Calcium: 9 mg/dL (ref 8.4–10.5)
Chloride: 109 mEq/L (ref 96–112)
GFR calc Af Amer: >60 mL/min (ref >60)
Glucose, Bld: 137 mg/dL — ABNORMAL HIGH (ref 70–99)
Potassium: 3.9 mEq/L (ref 3.5–5.1)

## 2010-08-04 LAB — D-DIMER, QUANTITATIVE: D-Dimer, Quant: 0.52 ug/mL-FEU — ABNORMAL HIGH (ref 0.00–0.48)

## 2010-08-04 LAB — CBC
Hemoglobin: 11.9 g/dL — ABNORMAL LOW (ref 13.0–17.0)
Platelets: 159 10*3/uL (ref 150–400)
RBC: 3.48 MIL/uL — ABNORMAL LOW (ref 4.22–5.81)

## 2010-08-04 LAB — GLUCOSE, CAPILLARY: Glucose-Capillary: 192 mg/dL — ABNORMAL HIGH (ref 70–99)

## 2010-08-05 ENCOUNTER — Inpatient Hospital Stay (HOSPITAL_COMMUNITY): Payer: Medicare Other

## 2010-08-05 DIAGNOSIS — Z955 Presence of coronary angioplasty implant and graft: Secondary | ICD-10-CM

## 2010-08-05 HISTORY — DX: Presence of coronary angioplasty implant and graft: Z95.5

## 2010-08-05 LAB — BASIC METABOLIC PANEL
CO2: 26 mEq/L (ref 19–32)
Chloride: 108 mEq/L (ref 96–112)
Creatinine, Ser: 1.24 mg/dL (ref 0.4–1.5)
GFR calc Af Amer: >60 mL/min (ref >60)
Sodium: 137 mEq/L (ref 135–145)

## 2010-08-05 LAB — GLUCOSE, CAPILLARY
Glucose-Capillary: 110 mg/dL — ABNORMAL HIGH (ref 70–99)
Glucose-Capillary: 119 mg/dL — ABNORMAL HIGH (ref 70–99)
Glucose-Capillary: 110 mg/dL — ABNORMAL HIGH (ref 70–99)
Glucose-Capillary: 232 mg/dL — ABNORMAL HIGH (ref 70–99)

## 2010-08-05 LAB — CBC
Hemoglobin: 12.2 g/dL — ABNORMAL LOW (ref 13.0–17.0)
MCH: 34.4 pg — ABNORMAL HIGH (ref 26.0–34.0)
RBC: 3.55 MIL/uL — ABNORMAL LOW (ref 4.22–5.81)

## 2010-08-06 LAB — BASIC METABOLIC PANEL
BUN: 19 mg/dL (ref 6–23)
CO2: 23 mEq/L (ref 19–32)
Chloride: 105 mEq/L (ref 96–112)
Creatinine, Ser: 1.29 mg/dL (ref 0.4–1.5)

## 2010-08-06 LAB — CBC
MCH: 32.3 pg (ref 26.0–34.0)
MCV: 99.1 fL (ref 78.0–100.0)
Platelets: 139 10*3/uL — ABNORMAL LOW (ref 150–400)
RDW: 12.6 % (ref 11.5–15.5)

## 2010-08-06 LAB — GLUCOSE, CAPILLARY: Glucose-Capillary: 206 mg/dL — ABNORMAL HIGH (ref 70–99)

## 2010-08-07 ENCOUNTER — Telehealth: Payer: Self-pay | Admitting: Cardiovascular Disease

## 2010-08-07 NOTE — Telephone Encounter (Signed)
PRIOR AUTH CALLED FOR BRILINTA, PENDING

## 2010-08-07 NOTE — Telephone Encounter (Signed)
CONFIRM THAT PT TAKING ASA 81MG , PT CONFIRMED THAT DOSE IS CORRECT. Cydney Ok RN

## 2010-08-09 ENCOUNTER — Emergency Department (HOSPITAL_COMMUNITY): Payer: Medicare Other

## 2010-08-09 ENCOUNTER — Ambulatory Visit (HOSPITAL_COMMUNITY)
Admission: EM | Admit: 2010-08-09 | Discharge: 2010-08-10 | Disposition: A | Discharge status: Home / Self Care | Payer: Medicare Other | Attending: Cardiovascular Disease | Admitting: Cardiovascular Disease

## 2010-08-09 DIAGNOSIS — R079 Chest pain, unspecified: Secondary | ICD-10-CM

## 2010-08-09 DIAGNOSIS — K2270 Barrett's esophagus without dysplasia: Secondary | ICD-10-CM | POA: Insufficient documentation

## 2010-08-09 DIAGNOSIS — Z79899 Other long term (current) drug therapy: Secondary | ICD-10-CM | POA: Insufficient documentation

## 2010-08-09 DIAGNOSIS — Z01810 Encounter for preprocedural cardiovascular examination: Secondary | ICD-10-CM | POA: Insufficient documentation

## 2010-08-09 DIAGNOSIS — R0602 Shortness of breath: Secondary | ICD-10-CM | POA: Insufficient documentation

## 2010-08-09 DIAGNOSIS — Z01818 Encounter for other preprocedural examination: Secondary | ICD-10-CM | POA: Insufficient documentation

## 2010-08-09 DIAGNOSIS — Z9861 Coronary angioplasty status: Secondary | ICD-10-CM | POA: Insufficient documentation

## 2010-08-09 DIAGNOSIS — Z01812 Encounter for preprocedural laboratory examination: Secondary | ICD-10-CM | POA: Insufficient documentation

## 2010-08-09 DIAGNOSIS — I2510 Atherosclerotic heart disease of native coronary artery without angina pectoris: Secondary | ICD-10-CM | POA: Insufficient documentation

## 2010-08-09 DIAGNOSIS — E785 Hyperlipidemia, unspecified: Secondary | ICD-10-CM | POA: Insufficient documentation

## 2010-08-09 DIAGNOSIS — E119 Type 2 diabetes mellitus without complications: Secondary | ICD-10-CM | POA: Insufficient documentation

## 2010-08-09 DIAGNOSIS — I10 Essential (primary) hypertension: Secondary | ICD-10-CM | POA: Insufficient documentation

## 2010-08-09 LAB — CBC
Hemoglobin: 11.5 g/dL — ABNORMAL LOW (ref 13.0–17.0)
MCH: 34.6 pg — ABNORMAL HIGH (ref 26.0–34.0)
MCHC: 34.6 g/dL (ref 30.0–36.0)

## 2010-08-09 LAB — BASIC METABOLIC PANEL
BUN: 21 mg/dL (ref 6–23)
Chloride: 105 mEq/L (ref 96–112)
Glucose, Bld: 135 mg/dL — ABNORMAL HIGH (ref 70–99)
Potassium: 3.8 mEq/L (ref 3.5–5.1)

## 2010-08-09 LAB — DIFFERENTIAL
Basophils Relative: 0 % (ref 0–1)
Eosinophils Relative: 4 % (ref 0–5)
Monocytes Absolute: 0.4 10*3/uL (ref 0.1–1.0)
Monocytes Relative: 7 % (ref 3–12)
Neutro Abs: 4 10*3/uL (ref 1.7–7.7)

## 2010-08-09 LAB — CK TOTAL AND CKMB (NOT AT ARMC)
CK, MB: 2 ng/mL (ref 0.3–4.0)
Total CK: 116 U/L (ref 7–232)

## 2010-08-09 MED ORDER — IOHEXOL 300 MG/ML  SOLN
100.0000 mL | Freq: Once | INTRAMUSCULAR | Status: AC | PRN
  Administered 2010-08-09: 100 mL via INTRAVENOUS

## 2010-08-10 DIAGNOSIS — I2510 Atherosclerotic heart disease of native coronary artery without angina pectoris: Secondary | ICD-10-CM

## 2010-08-10 LAB — CK TOTAL AND CKMB (NOT AT ARMC): Total CK: 127 U/L (ref 7–232)

## 2010-08-10 LAB — GLUCOSE, CAPILLARY: Glucose-Capillary: 135 mg/dL — ABNORMAL HIGH (ref 70–99)

## 2010-08-11 NOTE — Op Note (Signed)
NAME:  [name], [name] NO.:  [id number]  MEDICAL RECORD NO.:  [id number]           PATIENT TYPE:  LOCATION:                                 FACILITY:  PHYSICIAN:  Graylin Shiver, M.D.   DATE OF BIRTH:  1923/12/25  DATE OF PROCEDURE:  08/04/2010 DATE OF DISCHARGE:                              OPERATIVE REPORT   INDICATIONS FOR PROCEDURE:  The patient is an 77 year old male who was admitted to the hospital with exertional chest pain.  A cardiac cath was done which showed a 60% to 70% lesion in one of his coronary arteries and it was felt this might be the cause of this problem.  However, Cardiology wanted to make sure he did not have a GI cause of this pain because the pain was also in the upper abdomen area.  It was also felt that an upper GI source needed to be ruled out because if he had a cardiac cath again with a stent placement he would need to be on Plavix.  Informed consent was obtained after explanation of the risks of bleeding, infection, and perforation.  PREMEDICATION: 1. Fentanyl 50 mcg IV. 2. Versed 5 mg IV.  PROCEDURE:  With the patient in the left lateral decubitus position, the Pentax gastroscope was inserted into the oropharynx.  I initially tried the adult gastroscope but could not get it down into the esophagus because of the angulation.  I therefore used a pediatric gastroscope and was able to get it down without any difficulty.  The scope was passed down into the stomach and then into the duodenum.  The second portion and bulb of the duodenum were normal.  The stomach looked normal in its entirety.  The esophagus revealed what appeared to be a long segment of Barrett esophagus and I estimated the length of this to be approximately 13 cm.  The actual esophagogastric junction should have been at around 40 cm but I actually saw the squamocolumnar junction at 27 cm up in the tubular esophagus.  A few biopsies were obtained to confirm or  disprove Barrett's.  I did not see any evidence of any ulcers, erosions, or other abnormalities in the esophagus which would make me think this was the source of pain.  He tolerated this procedure well without complications.  IMPRESSION:  Suspected long segment Barrett esophagus.  Biopsies were obtained.  PLAN:  Check biopsies for Barrett's.  I would recommend using a proton pump inhibitor therapy, and if he has a cardiac coronary stent placed it would be safe to use Dexilant or Protonix along with Plavix.  Given his age, I would not recommend routine Barrett surveillance every few years.  I doubt that the findings on this upper endoscopy would be the source of his exertional chest pain. .          ______________________________ Graylin Shiver, M.D.     SFG/MEDQ  D:  08/04/2010  T:  08/05/2010  Job:  [id number]  cc:   Vesta Mixer, M.D. John C. Madilyn Fireman, M.D. Genene Churn. Cyndie Chime, M.D., F.A.C.P.  Electronically Signed by Herbert Moors MD on 08/11/2010  04:38:59 PM

## 2010-08-11 NOTE — Procedures (Signed)
  [name], [name] NO.:  [id number]  MEDICAL RECORD NO.:  [id number]           PATIENT TYPE:  O  LOCATION:  2009                         FACILITY:  [facility]  PHYSICIAN:  Peter M. Swaziland, M.D.  DATE OF BIRTH:  01-Feb-1924  DATE OF PROCEDURE:  08/10/2010 DATE OF DISCHARGE:  08/10/2010                           CARDIAC CATHETERIZATION   INDICATIONS FOR PROCEDURE:  An 77 year old white male, status post recent stenting of the mid LAD with a drug-eluting stent who presents for recurrent chest pain and tightness.  Troponins were borderline.  PROCEDURES:  Coronary angiography only.  ACCESS:  Via the right femoral artery using standard Seldinger technique.  EQUIPMENT:  A 5-French 4-cm right and left Judkins catheter, 5-French arterial sheath.  MEDICATIONS:  Local anesthesia with 1% lidocaine. Versed 2 mg IV.  HEMODYNAMIC DATA:  Aortic pressure is 157/75 with a mean of 105 mmHg.  ANGIOGRAPHIC DATA:  The left main coronary artery was normal.  The left anterior descending artery had a 30% ostial stenosis.  There is also a 30% stenosis in the distal vessel.  The stent in the mid LAD was widely patent.  The left circumflex branch had a 40% segmental stenosis in the proximal vessel.  The left circumflex coronary artery had mild irregularities,  less than or equal to 20%.  Right coronary artery has mild irregularities throughout less than 20%.  FINAL INTERPRETATION:  Nonobstructive coronary artery disease There is continued excellent patency of the stent to the left anterior descending.  PLAN:  We will continue medical therapy.          ______________________________ Peter M. Swaziland, M.D.    PMJ/MEDQ  D:  08/10/2010  T:  08/11/2010  Job:  [id number]  Electronically Signed by PETER Swaziland M.D. on 08/11/2010 11:18:28 AM

## 2010-08-11 NOTE — Consult Note (Signed)
NAME:  [name], [name] NO.:  [id number]  MEDICAL RECORD NO.:  [id number]           PATIENT TYPE:  I  LOCATION:  6527                         FACILITY:  [facility]  PHYSICIAN:  Graylin Shiver, M.D.   DATE OF BIRTH:  1924-03-24  DATE OF CONSULTATION:  08/03/2010 DATE OF DISCHARGE:                                CONSULTATION   REASON FOR CONSULTATION:  The patient is an 77 year old male who was admitted to the hospital after ongoing complaints of chest pain which occurred with exertion.  The patient described a feeling of pressure sensation wrapping around his entire chest as well as upper abdomen with exertion that resolved with rest.  He underwent a cardiac catheterization today which showed a lesion in one of the coronary arteries at around 60 or 70% stenosis which was felt to possibly be a source of his pain.  The patient does have a history of coronary artery disease.  I was called by Dr. Elease Hashimoto after the cardiac cath to rule out any specific GI disorder which might cause this pain as well.  It was felt that the GI source was a possibility also.  The patient gives no history of heartburn, does not complain of nausea or vomiting.  He gives no history of peptic ulcer disease.  He does have a history of colon cancer and is status post right hemicolectomy years ago.  He is up to date on his colonoscopy surveillance and his last colonoscopy was 3 years ago in 2009, by Dr. Dorena Cookey.  He had a cholecystectomy in 2010, secondary to gallstones and intraoperative cholangiogram was negative.  PAST HISTORY:  Also includes diabetes, hypertension.  There is a history of gallstone pancreatitis in the past.  MEDICATIONS PRIOR TO ADMISSION:  Aspirin 81 mg, glimepiride, lisinopril/hydrochlorothiazide, multivitamin, simvastatin, eyedrops, Toprol-XL, and vitamin D.  ALLERGIES:  None known.  SOCIAL HISTORY:  He does not smoke or drink alcohol.  SYSTEMS REVIEWED:  No other  specific complaints.  PHYSICAL EXAMINATION:  GENERAL:  He is alert and oriented.  He does not appear in any acute distress.  He is nonicteric. HEART:  A little irregular.  No murmurs heard. LUNGS:  Clear. ABDOMEN:  Soft, nontender.  No hepatosplenomegaly.  IMPRESSION: 1. Chest pain and upper abdominal pain.  The symptoms of which sound     cardiac with an exertional component. 2. Abnormal cardiac cath. 3. History of colon cancer.  The patient is up to date on his     colonoscopic surveillance. 4. History of laparoscopic cholecystectomy secondary to gallstones.  PLAN:  He is scheduled for a CT of the abdomen tomorrow.  I will also set him up for an EGD tomorrow just to make sure he does not have a peptic ulcer or something in the upper tract to explain the pain that he has been having.  The cardiologist welts concern that if he had a cath and a stent was placed, that he would be put on Plavix so they wanted to make sure there was not an underlying upper GI lesion as well.  ______________________________ Graylin Shiver, M.D.   SFG/MEDQ  D:  08/03/2010  T:  08/04/2010  Job:  [id number]  cc:   Everardo All. Madilyn Fireman, M.D. Genene Churn. Cyndie Chime, M.D., F.A.C.P.  Electronically Signed by Herbert Moors MD on 08/11/2010 04:38:56 PM

## 2010-08-12 ENCOUNTER — Other Ambulatory Visit (HOSPITAL_COMMUNITY): Payer: MEDICARE | Admitting: Radiology

## 2010-08-12 NOTE — H&P (Signed)
[name], [name] NO.:  [id number]  MEDICAL RECORD NO.:  [id number]           PATIENT TYPE:  I  LOCATION:  2922                         FACILITY:  [facility]  PHYSICIAN:  Armanda Magic, M.D.     DATE OF BIRTH:  03-31-1924  DATE OF ADMISSION:  08/01/2010 DATE OF DISCHARGE:                             HISTORY & PHYSICAL   PRIMARY CARDIOLOGIST:  Vesta Mixer, MD  REFERRING PHYSICIAN:  Dione Booze, MD  CHIEF COMPLAINT:  Chest pain.  HISTORY OF PRESENT ILLNESS:  This is an 77 year old white male with a history of CAD, diabetes mellitus, hypertension who presented to the emergency room with complaints of chest pain.  Apparently, he has had some problems with his gallbladder surgery with shortness of breath and chest pain.  He was seen by Dr. Deborah Chalk last week and was set up for nuclear stress test the first week of April.  He says he has been having pain at the bottom of his ribcage off and on, which occurs with exertion, but resolves with rest.  That is why Dr. Deborah Chalk set up a stress test.  Today, he was out walking and had recurrence of his pain underneath his rib cage on both sides along with some a blurred vision that lasted about 30 minutes.  He went to the emergency room. Currently, he is pain free.  He took two sublingual nitroglycerin with some improvement in his pain.  The EMS gave him another sublingual nitroglycerin with resolution of pain.  PAST MEDICAL HISTORY:  Includes: 1. CAD with moderate LAD disease. 2. Diabetes mellitus. 3. Hypertension. 4. Gallstone pancreatitis requiring laparoscopic cholecystectomy.  MEDICATIONS: 1. Vitamin D3 over-the-counter daily. 2. Toprol-XL 50 mg daily. 3. Systane eyedrops on the left eye 2 drops q.i.d. p.r.n. 4. Simvastatin 40 mg daily. 5. Prevision eye vitamin 1 daily. 6. Multivitamin daily. 7. Lisinopril/hydrochlorothiazide 20/12.5 mg daily. 8. Glimepiride 4 mg daily. 9. Aspirin 81 mg daily.  PAST  SURGICAL HISTORY:  Laparoscopic cholecystectomy and colon resection.  He has no known drug allergies.  SOCIAL HISTORY:  He denies tobacco or alcohol use.  FAMILY HISTORY:  His brother has CAD.  REVIEW OF SYSTEMS:  Other than what is stated in the HPI is negative.  PHYSICAL EXAMINATION:  VITAL SIGNS:  Blood pressure is 134/57, heart rate 91, O2 saturations 94% on room air. GENERAL:  This is a well-developed, well-nourished white male in no acute distress.  HEENT: Benign. NECK:  Supple without lymphadenopathy.  Carotid upstrokes +2 bilaterally.  No bruits. LUNGS:  Clear to auscultation throughout. HEART:  Regular rate and rhythm.  No murmurs, rubs, or gallops.  Normal S1 and S2. ABDOMEN:  Soft, nontender, nondistended.  Normoactive bowel sounds.  No hepatosplenomegaly. EXTREMITIES:  No cyanosis, erythema, or edema.  +2 dorsalis pedis pulses bilaterally.  LABORATORY DATA:  Sodium 137, potassium 4.2, chloride 107, bicarb 21, BUN 37, creatinine 1.6.  White cell count 4.9, hemoglobin 11.5, hematocrit 33.8, platelet count 165.  Point-of-care markers are negative x1.  EKG shows sinus rhythm with PACs, right bundle-branch block and occasional PVCs.  ASSESSMENT: 1. Chest pain with negative enzymes x1  and EKG unchanged from EKG of     September 2011. 2. Moderate left anterior descending coronary artery disease in the     past. 3. Hypertension. 4. Diabetes mellitus. 5. Transient blurring of vision for 25-30 minutes during his chest     tightness.  PLAN:  Admit to telemetry bed.  Cycle cardiac enzymes.  We will check head CT secondary to blurred vision prior to starting IV heparin.  If that is negative, we will start IV heparin drip.  We will start IV nitroglycerin drip and continue aspirin.  We will make n.p.o. after midnight for possible stress Cardiolite in the morning if his enzymes are negative.  Further workup per Dr. Elease Hashimoto of [facility].     Armanda Magic,  M.D.     TT/MEDQ  D:  08/01/2010  T:  08/02/2010  Job:  [id number]  Electronically Signed by Armanda Magic M.D. on 08/12/2010 11:26:29 AM

## 2010-08-13 ENCOUNTER — Encounter: Payer: Self-pay | Admitting: Nurse Practitioner

## 2010-08-13 ENCOUNTER — Ambulatory Visit (INDEPENDENT_AMBULATORY_CARE_PROVIDER_SITE_OTHER): Payer: Medicare Other | Admitting: Nurse Practitioner

## 2010-08-13 ENCOUNTER — Telehealth: Payer: Self-pay | Admitting: Cardiovascular Disease

## 2010-08-13 ENCOUNTER — Telehealth: Payer: Self-pay | Admitting: Cardiology

## 2010-08-13 VITALS — BP 106/70 | HR 66 | Ht 69.0 in | Wt 167.0 lb

## 2010-08-13 DIAGNOSIS — I2510 Atherosclerotic heart disease of native coronary artery without angina pectoris: Secondary | ICD-10-CM | POA: Insufficient documentation

## 2010-08-13 DIAGNOSIS — Z9861 Coronary angioplasty status: Secondary | ICD-10-CM

## 2010-08-13 DIAGNOSIS — T148XXA Other injury of unspecified body region, initial encounter: Secondary | ICD-10-CM | POA: Insufficient documentation

## 2010-08-13 DIAGNOSIS — Z955 Presence of coronary angioplasty implant and graft: Secondary | ICD-10-CM

## 2010-08-13 NOTE — Patient Instructions (Addendum)
Your arm is not felt to be infected, just some bruising that should improve over the next few days.  Try some ice and then alternate with heat for the discomfort. See Dr. Elease Hashimoto as scheduled for April 13th. Call for any problems.

## 2010-08-13 NOTE — Telephone Encounter (Signed)
PT CALLING RE HAVING A HEART CATH, HAVING A LOT OF PAIN, AND SEVERE BRUISING SINCE Tuesday-PLS ADVISE

## 2010-08-13 NOTE — Assessment & Plan Note (Signed)
He is reassured. His actual cath site is stable. There is no signs of infection, just some bruising. This should improve with time. He is encouraged to use ice and then alternate with heat. He will see Dr. Elease Hashimoto as scheduled on the 13th of April.

## 2010-08-13 NOTE — Progress Notes (Signed)
History of Present Illness: [name] is seen today for a work in visit. He is seen for Dr. Elease Hashimoto. He called earlier this morning and reported that his right arm was red and swollen. He has had recent PCI thru the right radial approach. Apparently it was a difficult procedure. He is on Brilinta. He has had no more chest pain. His right arm is sore especially at night. He has no other complaint at this time.  Current Outpatient Prescriptions on File Prior to Visit  Medication Sig Dispense Refill  . aspirin 81 MG tablet Take 81 mg by mouth daily.        . Cholecalciferol (VITAMIN D-3 PO) Take by mouth daily.        [name] glimepiride (AMARYL) 4 MG tablet Take 4 mg by mouth daily before breakfast.        . lisinopril-hydrochlorothiazide (PRINZIDE,ZESTORETIC) 20-12.5 MG per tablet Take 1 tablet by mouth daily.        . metoprolol (TOPROL-XL) 50 MG 24 hr tablet Take 50 mg by mouth daily.        . Multiple Vitamin (MULTIVITAMIN) tablet Take 1 tablet by mouth daily.        [name] Glycol-Propyl Glycol (SYSTANE OP) Apply to eye 4 (four) times daily.        . simvastatin (ZOCOR) 40 MG tablet Take 40 mg by mouth at bedtime.        . Ticagrelor (BRILINTA) 90 MG TABS Take 90 mg by mouth 2 (two) times daily.  60 tablet      No Known Allergies  Past Medical History  Diagnosis Date  . Coronary artery disease   . CAD (coronary artery disease)     moderate  . Hypertension   . Diabetes mellitus   . Hyperlipidemia     Past Surgical History  Procedure Date  . Cholecystectomy   . Colon cancer     history of    History  Smoking status  . Former Smoker  . Quit date: 05/10/1994  Smokeless tobacco  . Not on file    History  Alcohol Use No    Family History  Problem Relation Age of Onset  . Cancer Mother   . Ulcers Father     Review of Systems: The review of systems is as above. He is bruising easily.  All other systems were reviewed and are negative.  Physical Exam: BP 106/70  Pulse 66   Ht 5\' 9"  (1.753 m)  Wt 167 lb 6.4 oz (75.932 kg)  BMI 24.72 kg/m2 Patient is very pleasant and in no acute distress. He looks younger than his stated age. Skin is warm and dry. Color is normal.  HEENT is unremarkable. Normocephalic/atraumatic. PERRL. Sclera are nonicteric. Neck is supple. No masses. No JVD. Lungs are clear. Cardiac exam shows a regular rate and rhythm. He has a soft systolic murmur.  Abdomen is soft. Extremities are without edema. Gait and ROM are intact. No gross neurologic deficits noted. His right arm has moderate ecchymosis, but is soft. There is no redness or excessive swelling.   Dr. Swaziland has assessed his arm as well and concurs.  Assessment / Plan:

## 2010-08-13 NOTE — Telephone Encounter (Signed)
This is Dr Harvie Bridge patient.  I spoke with the pt and instructed him to call Dr Harvie Bridge office (phone number given to pt).  Pt agreed with plan.

## 2010-08-13 NOTE — Telephone Encounter (Signed)
Pt had LHC arm access. C/O arm pain and redness at entry point and surrounding with pain going up arm. App made with Norma Fredrickson NP today. Alfonso Ramus RN

## 2010-08-13 NOTE — Assessment & Plan Note (Signed)
Will continue the Brilinta. He is not having anymore chest pain.

## 2010-08-13 NOTE — Telephone Encounter (Signed)
PT SAID DR Riley Kill DID A CATH ON HIS ARM AND ITS BRUISING AND HURTING. HE CALLED THEIR OFFICE AND WAS TOLD TO CALL HERE AND FOLLOWUP WITH DR Elease Hashimoto. PT HAS AN APPT ON 08/21/10. [facility] AND WAS ASK TO TAKE  A MESG FOR CALL BACK.

## 2010-08-18 ENCOUNTER — Ambulatory Visit: Payer: MEDICARE | Admitting: Cardiovascular Disease

## 2010-08-20 NOTE — Procedures (Signed)
NAMEGILBERT, [name]             ACCOUNT NO.:  [id number]  MEDICAL RECORD NO.:  [id number]           PATIENT TYPE:  I  LOCATION:  6527                         FACILITY:  [facility]  PHYSICIAN:  Arturo Morton. Riley Kill, MD, FACCDATE OF BIRTH:  [birth year]  DATE OF PROCEDURE:  08/03/2010 DATE OF DISCHARGE:                           CARDIAC CATHETERIZATION   INDICATIONS:  Mr. Everingham is a delightfully 77 year old who has diabetes, hypertension, and some known coronary artery disease.  The current study was done to assess his coronary anatomy.  He has been having exertional discomfort that goes into the rib cages.  This has occurred ever since he had his gallbladder surgery.  Prior to that time, it was quiescent.  He had known modest LAD disease, although that study has not been done recently.  The current study was done to assess his coronary anatomy.  He was seen by Dr. Elease Hashimoto who set up the procedure. Risks, benefits, and alternatives were discussed with the patient.  He consented to proceed.  PROCEDURES: 1. Left heart catheterization. 2. Selective coronary arteriography. 3. Selective left ventriculography.  DESCRIPTION OF PROCEDURE:  The procedure was performed from the right radial artery.  We got easy access to the right radial artery.  He had a moderate loop noted just superior to the elbow.  We were able to ultimately navigate this area, although it did make catheter manipulation somewhat difficult.  Views of the right and left coronaries were obtained in multiple angiographic projections.  Central aortic and left ventricular pressures were measured with pigtail and ventriculography was performed in the RAO projection.  The patient did receive intraarterial verapamil 3 mg and 200 mcg of intraarterial nitroglycerin.  He also received 4000 units of heparin.  A TR band was placed without complication.  HEMODYNAMIC DATA: 1. Central aortic pressure was 171/84, mean 118. 2. LV  pressure was 150/23. 3. There was no gradient or pullback across aortic valve.  ANGIOGRAPHIC DATA: 1. The left main is free of critical disease. 2. The LAD is modestly calcified.  There is about 30% proximal     narrowing and an eccentric fairly smooth 70% stenosis in the     midvessel.  It looks less severe in the LAO cranial views, but     perhaps more severe in the RAO cranial views.  There is 60-70%     narrowing in the diagonal at a bend.  The LAD distally has some     modest luminal irregularity and one focal area of probably about     50% narrowing, but no critical stenoses. 3. The ramus intermedius has about 30% proximal stenosis. 4. The proximal circumflex has about 40% proximal stenosis. 5. The RCA demonstrates 30% ostial and 30% distal stenosis at the     takeoff of the PDA and PLA.  There is mild luminal irregularity     throughout.  CONCLUSION: 1. Well-preserved left ventricular systolic function with no definite     wall motion abnormalities. 2. Moderately high-grade stenosis of the left anterior descending     artery. 3. Other abnormalities as described above including a diagonal  stenosis.  DISPOSITION:  The patient's symptoms are linked in part to his recent surgery.  Whether or not the focal LAD lesion is a source of symptoms is unclear.  He had been set up for a stress test.  He came in prior to that.  He did have some syncope as well.  I will discuss all of this with Dr. Elease Hashimoto and further evaluation will commence.  The patient would be a candidate for percutaneous intervention, but I think given the nature of his presentation it will be important to rule out other entities before the assumption is made that this represents class III angina.     Arturo Morton. Riley Kill, MD, [facility]     TDS/MEDQ  D:  08/03/2010  T:  08/04/2010  Job:  [id number]  cc:   Vesta Mixer, M.D. [facility] Dione Booze, MD  Electronically Signed by Shawnie Pons MD [facility] on  08/20/2010 05:40:56 AM

## 2010-08-20 NOTE — Procedures (Signed)
NAMECELESTINE, BOUGIE             ACCOUNT NO.:  [id number]  MEDICAL RECORD NO.:  [id number]           PATIENT TYPE:  LOCATION:                                 FACILITY:  PHYSICIAN:  Arturo Morton. Riley Kill, MD, FACCDATE OF BIRTH:  1924/02/17  DATE OF PROCEDURE: DATE OF DISCHARGE:                           CARDIAC CATHETERIZATION   INDICATION:  Mr. [name] is a very delightful 77 year old gentleman who has had exertional dyspnea.  It has gotten progressively worse.  He underwent diagnostic catheterization which demonstrated a moderately high-grade stenosis in the mid left anterior descending artery of approximately 80%.  Importantly, the patient had also some GI issues and his symptoms seemed to began not long after he had his laparoscopic cholecystectomy.  He had had gallstone pancreatitis.  As such, we got a GI consult prior to considering coronary intervention.  He underwent endoscopy which demonstrated a Barrett esophagus.  He also had an ultrasound done.  Dr. Elease Hashimoto saw him, and he has followed him longitudinally for a long time.  After consideration of the options, percutaneous intervention was suggested.  He was loaded with 600 mg of oral clopidogrel and brought to the catheterization laboratory for percutaneous stenting.  PROCEDURE:  Percutaneous stenting of the left anterior descending artery with a drug-eluting stent.  DESCRIPTION OF THE PROCEDURE:  The procedure was performed from the right femoral artery.  The previous study had been done radially. Through an anterior puncture, the femoral artery was easily entered. Following this, bivalirudin was given according to protocol and a JL-3.5 guiding catheter was utilized to intubate the left main.  Views of the left coronary artery were obtained.  An Intuition wire nonhydrophilic was placed across the lesion after an appropriate ACT was obtained. Predilatation was done with a short 8-mm balloon.  The vessel was stented  using a 2.5 x 12 Promus Element drug-eluting stent.  A 2.75, 9- mm length [place] Sprinter was used to post dilate the lesion up to 13 atmospheres.  Following this, there was still some suggestion of mild stent mal-expansion and the noncompliant balloon was reinserted and additional dilatation done up to 16 atmospheres with fairly marked improvement in the appearance of the artery.  There did not appear to be edge tear.  Final angiographic views were obtained.  Following this, gloves were exchanged, the right groin was reprepped, Angio-Seal closure was performed without complication and good hemostasis was achieved. Bivalirudin was discontinued.  ANGIOGRAPHIC DATA: 1. Femoral angiography revealed some mild calcification of the femoral     artery but a good landing site, good location for Angio-Seal     closure. 2. The left main is free of critical disease. 3. The LAD tapers.  It is calcified proximally with about 30-40%     narrowing.  There is a focal 80% slightly hypodense lesion in the     mid left anterior descending artery prior to the takeoff of the     diagonal and distal LAD.  The diagonal itself is a small-caliber     vessel with about 50% narrowing, the distal LAD also has diffuse     calcified plaque distally.  It  does not appear to be critical.     Following stenting, the 80% lesion was reduced to approximately 0%     post intervention.  There did not appear to be evidence of edge     tear.  There was some segmental geographic miss, but this was     predicated on the fact that the vessel was diffusely diseased     throughout.  The distal artery remained widely patent with good     TIMI-3 flow.  The diagonal was unchanged following the procedure.     The circumflex was unchanged from the diagnostic procedure.  CONCLUSION:  Successful percutaneous stenting of the mid left anterior descending artery using a Promus Element drug-eluting stent.  DISPOSITION:  The patient will remain  on dual-antiplatelet therapy for a minimum of 12 months.  The patient is a stable individual.  He will be on Protonix.  He will be followed closely by Dr. Elease Hashimoto.     Arturo Morton. Riley Kill, MD, [facility]     TDS/MEDQ  D:  08/05/2010  T:  08/06/2010  Job:  [id number]  cc:   CV Laboratory Vesta Mixer, M.D.  Electronically Signed by Shawnie Pons MD [facility] on 08/20/2010 05:41:00 AM

## 2010-08-21 ENCOUNTER — Encounter: Payer: Self-pay | Admitting: Cardiovascular Disease

## 2010-08-21 ENCOUNTER — Ambulatory Visit (INDEPENDENT_AMBULATORY_CARE_PROVIDER_SITE_OTHER): Payer: Medicare Other | Admitting: Cardiovascular Disease

## 2010-08-21 VITALS — BP 130/60 | HR 64 | Ht 69.0 in | Wt 173.4 lb

## 2010-08-21 DIAGNOSIS — E785 Hyperlipidemia, unspecified: Secondary | ICD-10-CM

## 2010-08-21 DIAGNOSIS — I2510 Atherosclerotic heart disease of native coronary artery without angina pectoris: Secondary | ICD-10-CM

## 2010-08-21 NOTE — Progress Notes (Signed)
[name] Date of Birth  November 12, 1923 [facility] / [facility] [address]     [address] [place], [place]  [address] [phone]  Fax  [phone]  History of Present Illness:  [name] is an elderly gentleman with a history of coronary artery disease. He is status post recent PTCA and stenting of his left anterior descending artery. He has a history of hypertension and hyperlipidemia. He has not had any episodes of chest pain or shortness breath since his and spicy. He did have some soreness and bruising of his right arm around his catheter site.  Current Outpatient Prescriptions on File Prior to Visit  Medication Sig Dispense Refill  . aspirin 81 MG tablet Take 81 mg by mouth daily.        . Cholecalciferol (VITAMIN D-3 PO) Take by mouth daily.        [name] glimepiride (AMARYL) 4 MG tablet Take 4 mg by mouth daily before breakfast.        . lisinopril-hydrochlorothiazide (PRINZIDE,ZESTORETIC) 20-12.5 MG per tablet Take 1 tablet by mouth daily.        . metoprolol (TOPROL-XL) 50 MG 24 hr tablet Take 50 mg by mouth daily.        . Multiple Vitamin (MULTIVITAMIN) tablet Take 1 tablet by mouth daily.        . Multiple Vitamins-Minerals (PRESERVISION/LUTEIN) CAPS Take by mouth daily.    0  . pantoprazole (PROTONIX) 40 MG tablet Take 1 tablet (40 mg total) by mouth daily.  30 tablet    . Polyethyl Glycol-Propyl Glycol (SYSTANE OP) Apply to eye 4 (four) times daily.        . simvastatin (ZOCOR) 40 MG tablet Take 40 mg by mouth at bedtime.        . Ticagrelor (BRILINTA) 90 MG TABS Take 90 mg by mouth 2 (two) times daily.  60 tablet     No Known Allergies  Past Medical History  Diagnosis Date  . Coronary artery disease     s/p PCI to LAD 08/05/10  . Hypertension   . Diabetes mellitus   . Hyperlipidemia   . Barrett esophagus   . Diabetes mellitus   . Gallstone pancreatitis 2011  . Nephrolithiasis   . SBO (small bowel obstruction) 2005  . Colon cancer      Past Surgical History  Procedure Date  . Cholecystectomy 2011  . Right colectomy   . Coronary angioplasty with stent placement 08/05/10    DES to the LAD  . Cardiac catheterization 08/03/10  . Cardiac catheterization 08/09/10    Stent patent    History  Smoking status  . Former Smoker  . Quit date: 05/10/1994  Smokeless tobacco  . Not on file    History  Alcohol Use No    Family History  Problem Relation Age of Onset  . Cancer Mother   . Ulcers Father     Reviw of Systems:  Reviewed in the HPI.  All other systems are negative.  Physical Exam: BP 130/60  Pulse 64  Ht 5\' 9"  (1.753 m)  Wt 173 lb 6.4 oz (78.654 kg)  BMI 25.61 kg/m2 The patient is alert and oriented x 3.  The mood and affect are normal.  The skin is warm and dry.  Color is normal.  The HEENT exam reveals that the sclera are nonicteric.  The mucous membranes are moist.  The carotids are 2+ without bruits.  There is no thyromegaly.  There  is no JVD.  The lungs are clear.  The chest wall is non tender.  The heart exam reveals a regular rate with a normal S1 and S2.  There are no murmurs, gallops, or rubs.  The PMI is not displaced.   Abdominal exam reveals good bowel sounds.  There is no guarding or rebound.  There is no hepatosplenomegaly or tenderness.  There are no masses.  Exam of the legs reveal no clubbing, cyanosis, or edema.  The legs are without rashes.  The distal pulses are intact.  Cranial nerves II - XII are intact.  Motor and sensory functions are intact.  The gait is normal.  Assessment / Plan:

## 2010-08-21 NOTE — Assessment & Plan Note (Signed)
[name] is doing very well from a cardiac standpoint. I'm pleased that he is not having any episodes of angina. He seems to be tolerating the Brilinta quite well.  I'll see him again in 6 months.

## 2010-08-21 NOTE — Assessment & Plan Note (Signed)
[name] currently on simvastatin. I would  like to change him to atorvastatin as soon as it's  available at a generic price. We will most likely change him to atorvastatin at his next visit.

## 2010-08-24 NOTE — Discharge Summary (Signed)
NAMEKEATEN, MASHEK NO.:  [id number]  MEDICAL RECORD NO.:  [id number]           PATIENT TYPE:  I  LOCATION:  6527                         FACILITY:  [facility]  PHYSICIAN:  Vesta Mixer, M.D. DATE OF BIRTH:  1924-02-22  DATE OF ADMISSION:  08/01/2010 DATE OF DISCHARGE:  08/06/2010                              DISCHARGE SUMMARY   PRIMARY CARDIOLOGIST:  Vesta Mixer, MD  DISCHARGE DIAGNOSIS:  Unstable angina.  SECONDARY DIAGNOSES: 1. Coronary artery disease status post drug-eluting stent placement to     the left anterior descending (coronary) artery this admission. 2. Barrett esophagus status post esophagogastroduodenoscopy and     confirmed by pathology this admission. 3. Hypertension. 4. Hyperlipidemia. 5. Diabetes mellitus. 6. History of gallstone pancreatitis status post laparoscopic     cholecystectomy. 7. History of colon cancer status post colon resection. 8. History of small-bowel obstruction secondary to adhesions. 9. Left-sided nephrolithiasis.  ALLERGIES:  GLUCOPHAGE.  PROCEDURES: 1. Diagnostic left heart cardiac catheterization revealing a 70%     lesion of the proximal left anterior descending (coronary) artery     with otherwise scattered nonobstructive plaque involving the     proximal first diagonal, distal left anterior descending (coronary)     artery, proximal left circumflex, and proximal and distal right     coronary artery.  Ejection fraction was normal. 2. Esophagogastroduodenoscopy performed August 04, 2010, showing a long     segment of Barrett esophagus with a pathologic confirmation.     Normal stomach and duodenum. 3. Successful percutaneous coronary intervention and stenting of the     left anterior descending (coronary) artery with placement of a 2.5     x 12-mm PROMUS Element Plus drug-eluting stent on August 05, 2010.  HISTORY OF PRESENT ILLNESS:  An 77 year old male with prior history of nonobstructive CAD who  recently developed exertional chest pain and dyspnea was seen in clinic with followup stress testing scheduled. Unfortunately, the patient had recurrent symptoms on August 01, 2010, prompting presentation to the ED where he had improvement in pain with sublingual nitroglycerin.  He was admitted for further evaluation.  HOSPITAL COURSE:  The patient ruled out for MI.  Given progressive symptoms, a decision was made to pursue cardiac catheterization which took place on August 03, 2010, revealing 70% lesion in the proximal LAD with otherwise nonobstructive disease and normal LV function.  It was felt that the LAD stenosis was of borderline significance and recommendation for other possible causes of symptoms was recommended prior to pursuing PCI.  Abdominal ultrasound was performed showing multiple bilateral renal calculi without hydronephrosis.  There was a stable left renal cyst but otherwise no acute findings to explain the patient's symptoms.  Gastroenterology was consulted and recommended EGD which was then performed on August 04, 2010, showing 13-cm segment of presumed Barrett esophagus.  Pathology has since confirmed Barrett esophagus without dysplasia.  As such, the patient has been placed on Protonix therapy.  Despite finding of Barrett esophagus, it was still felt that this did not explain the patient's exertional symptoms of chest pain and dyspnea. As a result, decision was made to  pursue PCI and the patient was taken back to the cath lab on August 05, 2010.  The patient underwent successful PCI and stenting of the LAD with placement of 2.5 x12 mm PROMUS Element Plus drug-eluting stent.  He tolerated this procedure well and though he was loaded with Plavix pre-procedurally, his P2Y12 was elevated at 303 with only 6% inhibition and therefore he has been loaded with  ticagrelor 180 mg this morning and will subsequently be discharged in 90 mg b.i.d.  The patient has been seen by  Cardiac Rehab and ambulating without difficulty and will be discharged home today in good condition.  DISCHARGE LABORATORY DATA:  Hemoglobin 11.3, hematocrit 34.7, WBC 5.6, platelets 139, P2Y12 303 with 6% inhibition.  D-dimer 0.52.  Sodium 138, potassium 3.9, chloride 105, CO2 23, BUN 19, creatinine 1.29, glucose 136.  Total bilirubin 0.5, alkaline phosphatase 37, AST 24, ALT 17. Total protein 6.3, albumin 3.5, calcium 9.0, CK 222, MB 2.9, troponin-I 0.03.  BNP 98.  Total cholesterol 106, triglycerides 219, HDL 28, LDL 34.  MRSA screen was negative.  DISPOSITION:  The patient will be discharged home today in good condition.  FOLLOWUP PLANS AND APPOINTMENTS:  The patient has a followup scheduled with Dr. Elease Hashimoto on August 18, 2010, at 11:45 a.m.  DISCHARGE MEDICATIONS: 1. Nitroglycerin 0.4 mg sublingual p.r.n. chest pain. 2. Protonix 40 mg daily. 3. Ticagrelor 90 mg b.i.d. 4. Aspirin 81 mg daily. 5. Glimepiride 4 mg daily. 6. Lisinopril/hydrochlorothiazide 20/12.5 mg daily. 7. Multivitamin daily. 8. Prevision eye vitamin 1 tablet daily. 9. Simvastatin 40 mg at bedtime. 10.Systane eyedrops over-the-counter 2 drops q.i.d. p.r.n. 11.Toprol-XL 50 mg daily. 12.Vitamin D3 over-the-counter 1 tablet daily.  OUTSTANDING LABORATORY STUDIES:  None.  DURATION OF DISCHARGE ENCOUNTER:  60 minutes including physician time.     Nicolasa Ducking, ANP   ______________________________ Vesta Mixer, M.D.    CB/MEDQ  D:  08/06/2010  T:  08/07/2010  Job:  [id number]  Electronically Signed by Nicolasa Ducking ANP on 08/18/2010 04:08:05 PM Electronically Signed by Kristeen Miss M.D. on 08/24/2010 03:13:41 PM

## 2010-08-25 NOTE — Discharge Summary (Signed)
[name], [name] NO.:  [id number]  MEDICAL RECORD NO.:  [id number]           PATIENT TYPE:  O  LOCATION:  2009                         FACILITY:  [facility]  PHYSICIAN:  [name], M.D.  DATE OF BIRTH:  [birth year]  DATE OF ADMISSION:  08/09/2010 DATE OF DISCHARGE:  08/10/2010                              DISCHARGE SUMMARY   PROCEDURES: 1. Cardiac catheterization. 2. Coronary arteriogram. 3. CT angiogram of the chest. 4. Portable chest x-ray.  PRIMARY FINAL DISCHARGE DIAGNOSIS:  Chest pain, nonobstructive coronary artery disease at catheterization and medical therapy recommended.  SECONDARY DIAGNOSES: 1. History of chest pain was 2.5 x 12-mm Promus drug-eluting stent to     the left anterior descending artery, August 05, 2010. 2. History of Barrett esophagus, status post EGD March 2012. 3. Hypertension. 4. Hyperlipidemia. 5. Diabetes. 6. History of gallstone pancreatitis with a laparoscopic     cholecystectomy in July 2011. 7. History of right colectomy for colon cancer in 1992 with repeat     surgery and radiation therapy in 1993. 8. Status post repeat colonoscopies. 9. History of nephrolithiasis. 10.Partial small-bowel obstruction in 2005, treated surgically     secondary to adhesions. 11.Family history of coronary artery disease in his brother. 12.Allergy or intolerance to GLUCOPHAGE.  TIME AT DISCHARGE:  36 minutes.  HOSPITAL COURSE:  [name] is an 77 year old male with a history of coronary artery disease.  He was discharged on August 06, 2010 after getting a stent to the LAD.  He had recurrent chest pain and came back to the emergency room on August 09, 2010.  He was admitted for further evaluation and treatment.  He was mildly anemic with a hemoglobin of 11.5 and hematocrit of 33.2. His blood sugars were monitored and followed closely.  His cardiac enzymes were negative except for minimal elevation of troponin at 0.07. The CT  angiogram of the chest was performed which showed no PE.  He had emphysema and interstitial accentuation as well as airway thickening possibly from bronchitis or reactive airway disease.  He was evaluated by [name] and it was felt that cardiac catheterization was the best option to further define his anatomy.  No ventriculogram was done because his EF was well preserved at cath on August 03, 2010.  The cardiac catheterization today showed normal left main, LAD 30% ostial and a widely patent stent.  The ramus intermedius had a 40% stenosis, the circumflex less than 20% and the RCA mild irregularities.  Dr. [place] had evaluated the films and felt that no further inpatient workup was indicated.  Postprocedure, [name] will need to ambulate.  If he is able to ambulate without chest pain or shortness of breath and his groin remains stable, he is considered stable for discharge, to follow up as an outpatient.  DISCHARGE INSTRUCTIONS: 1. His activity level is to be increased gradually with no lifting for     week and no driving for 2 days. 2. He is encouraged to stick to a low-sodium diabetic diet. 3. He is to call our office for problems with the cath site. 4. He is to  follow up with [name] on August 21, 2010 at 3:45 and     with [name] as needed.  DISCHARGE MEDICATIONS: 1. Lisinopril/hydrochlorothiazide 20/12.5 mg daily. 2. Toprol-XL 50 mg a day. 3. Systane eye drops q.i.d. p.r.n. 4. Simvastatin 40 mg nightly. 5. Multivitamin daily. 6. Sublingual nitroglycerin p.r.n. 7. Aspirin 81 mg a day. 8. Ticagrelor 90 mg b.i.d. 9. Protonix 40 mg a day. 10.Glimepiride 4 mg a day. 11.PreserVision eye vitamins daily. 12.Vitamin D3 daily.     [name] Demark, PA-C   ______________________________ [name], M.D.    RB/MEDQ  D:  08/10/2010  T:  08/11/2010  Job:  [id number]  cc:   [name] Quint. Plotnikov, MD  Electronically Signed by [name] Demark PA-C on 08/12/2010  01:36:53 PM Electronically Signed by [name] M.D. on 08/25/2010 03:14:12 PM

## 2010-08-26 NOTE — H&P (Signed)
[name], [name]             ACCOUNT NO.:  [id number]  MEDICAL RECORD NO.:  [id number]           PATIENT TYPE:  E  LOCATION:  [facility]                         FACILITY:  [facility]  PHYSICIAN:  Georga Hacking, M.D.DATE OF BIRTH:  04-19-1924  DATE OF ADMISSION:  08/09/2010                              HISTORY & PHYSICAL   REASON FOR ADMISSION:  Chest pain.  HISTORY:  There is an 77 year old male with a history of coronary artery disease, diabetes, and hypertension.  He had previously developed exertional tightness and pressure that limited activities.  Cardiac catheterization done on August 03, 2010, by the radial approach showed an 80% LAD stenosis and the GI consultant did an EGD with the documented Barrett esophagus.  Catheterization done on August 05, 2010, he had placement of a 2.5 x 12-mm Promus Element stent dilated to 2.75 mm by Dr. Riley Kill.  The patient was discharged on August 06, 2010, and states that he had no recurrence of the exertional substernal discomfort. Today, he developed a left-sided chest pain lasting around 2 hours associated with shortness of breath while he was working out on the patio picking up some bags.  He stated the pain was different and his previous pain was described as a sharp, constant left-sided pain.  He did not have bilateral fullness like he had had prior to the PCI.  He was given nitroglycerin in the ambulance on the way and is currently pain free.  Troponin was unremarkable.  He was just discharged 2 days ago.  While in the emergency room, he had a chest x-ray that shows some fullness of the interstitium and he subsequently had a CTA that did not show a pulmonary embolus.  We were called to admit the patient.  PAST MEDICAL HISTORY:  Remarkable for: 1. Barrett esophagus. 2. Coronary artery disease. 3. Diabetes. 4. Hypertension. 5. A previous history of gallstone pancreatitis. 6. Hyperlipidemia.  PAST SURGICAL HISTORY: 1. Laparoscopic  cholecystectomy. 2. Colon resection.  ALLERGIES:  None.  CURRENT MEDICATIONS: 1. Toprol-XL 50 mg daily. 2. Simvastatin 40 mg daily. 3. Lisinopril/hydrochlorothiazide 20/12.5 mg daily. 4. Aspirin 81 mg daily. 5. Glimepiride 4 mg daily. 6. Systane eyedrops in the left eye 2 drops q.i.d. p.r.n. 7. Prevision eye drops daily. 8. He is also on ticagrelor 90 mg b.i.d.  SOCIAL HISTORY:  He is a nonsmoker and drinks rare alcohol.  He lives in a retirement community with his wife.  FAMILY HISTORY:  Brothers had cardiac disease.  REVIEW OF SYSTEMS:  Performed and reveals no change since his discharge from the August 06, 2010.  PHYSICAL EXAMINATION:  GENERAL:  He is an elderly male, appearing younger than stated age. VITAL SIGNS:  His blood pressure is currently 163/70, pulse is currently 60 with irregular heart beats noted. SKIN:  Warm and dry. HEENT:  EOMI.  PERRLA.  C and S clear.  Funduscopic not examined. Pharynx negative. NECK:  Supple without masses, no JVD or thyromegaly.  Soft bilateral carotid bruits noted. LUNGS:  Clear to A  and P. CARDIAC:  Normal S1 and S2.  There is a soft systolic murmur at the left sternal  border. ABDOMEN:  Soft and nontender. EXTREMITIES:  Catheterization site is healed and very mild ecchymoses noted.  Radial site looks good.  Femoral pulses are 2+, distal pulses 2+.  No edema noted. NEUROLOGIC:  Normal cranial nerves.  Sensory and motor intact.  Normal cerebellar.  LABORATORY DATA:  Creatinine of 1.36, slightly increased from discharge creatinine of 1.29.  Initial point-of-care enzymes were negative. Hemoglobin is 11.5, hematocrit is 33.2.  IMPRESSION: 1. Left chest pain, prolonged, was relieved with nitroglycerin,     different than previous pain. 2. Coronary artery disease with recent drug-eluting stent to the left     anterior descending by Dr. Riley Kill on August 05, 2010. 3. Diabetes mellitus, non-insulin dependent. 4. Hypertension. 5.  Hyperlipidemia, under treatment. 6. History of gallstone pancreatitis. 7. History of Barrett esophagus.  RECOMMENDATIONS:  Give a single dose of Lovenox tonight, check serial enzymes.  The pain was somewhat different than his previous pain.  I will keep n.p.o. in case further workup will need to be done.  May possibly be candidate for early discharge if pain does not recur.     Georga Hacking, M.D.     WST/MEDQ  D:  08/09/2010  T:  08/09/2010  Job:  [id number]  cc:   Vesta Mixer, M.D. Thora Lance, M.D.  Electronically Signed by Lacretia Nicks. Donnie Aho M.D. on 08/26/2010 02:05:31 PM

## 2010-09-08 ENCOUNTER — Telehealth: Payer: Self-pay | Admitting: *Deleted

## 2010-09-08 ENCOUNTER — Encounter: Payer: Self-pay | Admitting: Cardiovascular Disease

## 2010-09-08 ENCOUNTER — Ambulatory Visit (INDEPENDENT_AMBULATORY_CARE_PROVIDER_SITE_OTHER): Payer: Medicare Other | Admitting: Cardiovascular Disease

## 2010-09-08 DIAGNOSIS — R110 Nausea: Secondary | ICD-10-CM

## 2010-09-08 DIAGNOSIS — Z955 Presence of coronary angioplasty implant and graft: Secondary | ICD-10-CM

## 2010-09-08 DIAGNOSIS — Z9861 Coronary angioplasty status: Secondary | ICD-10-CM

## 2010-09-08 DIAGNOSIS — R079 Chest pain, unspecified: Secondary | ICD-10-CM

## 2010-09-08 NOTE — Progress Notes (Signed)
[name] Date of Birth  [birth year] [facility] / [facility] [address]     [address] [place], [place]  [address] [phone]  Fax  [phone]  History of Present Illness:  [name] presents with some chest tightness, nausea, and shortness breath. These episodes typically happen in the afternoon. [name] never had them in the morning. The symptoms are relieved with Pepto-Bismol. He has a history of gastroesophageal reflux disease in the past. The symptoms are clearly different than his angina symptoms. He has not had to take any nitroglycerin   Current Outpatient Prescriptions on File Prior to Visit  Medication Sig Dispense Refill  . aspirin 81 MG tablet Take 81 mg by mouth daily.        . Cholecalciferol (VITAMIN D-3 PO) Take by mouth daily.        [name] glimepiride (AMARYL) 4 MG tablet Take 4 mg by mouth daily before breakfast.        . lisinopril-hydrochlorothiazide (PRINZIDE,ZESTORETIC) 20-12.5 MG per tablet Take 1 tablet by mouth daily.        . metoprolol (TOPROL-XL) 50 MG 24 hr tablet Take 50 mg by mouth daily.        . Multiple Vitamin (MULTIVITAMIN) tablet Take 1 tablet by mouth daily.        . Multiple Vitamins-Minerals (PRESERVISION/LUTEIN) CAPS Take by mouth daily.    0  . pantoprazole (PROTONIX) 40 MG tablet Take 1 tablet (40 mg total) by mouth daily.  30 tablet    . Polyethyl Glycol-Propyl Glycol (SYSTANE OP) Apply to eye 4 (four) times daily.        . simvastatin (ZOCOR) 40 MG tablet Take 40 mg by mouth at bedtime.        . Ticagrelor (BRILINTA) 90 MG TABS Take 90 mg by mouth 2 (two) times daily.  60 tablet      No Known Allergies  Past Medical History  Diagnosis Date  . Coronary artery disease     s/p PCI to LAD 08/05/10  . Hypertension   . Diabetes mellitus   . Hyperlipidemia   . Barrett esophagus   . Diabetes mellitus   . Gallstone pancreatitis 2011  . Nephrolithiasis   . SBO (small bowel obstruction) 2005  . Colon cancer      Past Surgical History  Procedure Date  . Cholecystectomy 2011  . Right colectomy   . Coronary angioplasty with stent placement 08/05/10    DES to the LAD  . Cardiac catheterization 08/03/10  . Cardiac catheterization 08/09/10    Stent patent    History  Smoking status  . Former Smoker  . Quit date: 05/10/1994  Smokeless tobacco  . Not on file    History  Alcohol Use No    Family History  Problem Relation Age of Onset  . Cancer Mother   . Ulcers Father     Reviw of Systems:  Reviewed in the HPI.  All other systems are negative.  Physical Exam: BP 140/60  Pulse 72  Wt 173 lb (78.472 kg) The patient is alert and oriented x 3.  The mood and affect are normal.  The skin is warm and dry.  Color is normal.  The HEENT exam reveals that the sclera are nonicteric.  The mucous membranes are moist.  The carotids are 2+ without bruits.  There is no thyromegaly.  There is no JVD.  The lungs are clear.  The chest wall is non tender.  The  heart exam reveals a regular rate with a normal S1 and S2.  There are no murmurs, gallops, or rubs.  The PMI is not displaced.   Abdominal exam reveals good bowel sounds.  There is no guarding or rebound.  There is no hepatosplenomegaly or tenderness.  There are no masses.  Exam of the legs reveal no clubbing, cyanosis, or edema.  The legs are without rashes.  The distal pulses are intact.  Cranial nerves II - XII are intact.  Motor and sensory functions are intact.  The gait is normal.  ECG: Normal sinus rhythm. He has a right bundle branch block  .He has occasional PVCs  Assessment / Plan:

## 2010-09-08 NOTE — Assessment & Plan Note (Signed)
[name] seems to be doing okay. [name] on Brilinta because he did not have any platelet inhibition after being dosed with Plavix. He is also too old for Effient.  [name] not had any recurrent anginal symptoms.

## 2010-09-08 NOTE — Patient Instructions (Addendum)
Try Prevacid 30 mg a day or Prilosec 20 mg a day ( Omeprazole) instead of Pantoprozole.  Also OK to take mylanta or Maalox  as needed.  Avoid Pepto-Bismol if possible (

## 2010-09-08 NOTE — Assessment & Plan Note (Signed)
[name] presents with episodes of chest tightness/abdominal pain. He also is having some nausea and some shortness breath. I suspect that he's having gastroesophageal reflux disease. He also may be having some irritation of his stomach from the Brilinta.  His symptoms sound clearly different than his angina. We will have him try taking over-the-counter Prevacid or omeprazole instead of the Protonix. I've asked him to take Mylanta or Maalox on an as-needed basis. I've asked him to avoid taking Pepto-Bismol since it contains extra aspirin which can work against him while he's on Brilinta.  He may need to see his general medical doctor or perhaps a gastroenterologist if this continues to

## 2010-09-08 NOTE — Telephone Encounter (Signed)
C/o chest tightness, nausea, started one week ago worsre after eats, told to come in. Pt verbalized understanding. Alfonso Ramus RN

## 2010-09-18 ENCOUNTER — Telehealth: Payer: Self-pay | Admitting: Cardiovascular Disease

## 2010-09-18 ENCOUNTER — Ambulatory Visit (INDEPENDENT_AMBULATORY_CARE_PROVIDER_SITE_OTHER): Payer: Medicare Other | Admitting: Cardiovascular Disease

## 2010-09-18 ENCOUNTER — Encounter: Payer: Self-pay | Admitting: Cardiovascular Disease

## 2010-09-18 VITALS — BP 140/56 | HR 64 | Wt 171.0 lb

## 2010-09-18 DIAGNOSIS — T148XXA Other injury of unspecified body region, initial encounter: Secondary | ICD-10-CM

## 2010-09-18 NOTE — Progress Notes (Signed)
[name] Date of Birth  1923-08-17 [facility] / [facility] [address]     [address] [place], [place]  [address] [phone]  Fax  [phone]  History of Present Illness:   Current Outpatient Prescriptions on File Prior to Visit  Medication Sig Dispense Refill  . aspirin 81 MG tablet Take 81 mg by mouth daily.        . Cholecalciferol (VITAMIN D-3 PO) Take by mouth daily.        [name] glimepiride (AMARYL) 4 MG tablet Take 4 mg by mouth daily before breakfast.        . lisinopril-hydrochlorothiazide (PRINZIDE,ZESTORETIC) 20-12.5 MG per tablet Take 1 tablet by mouth daily.        . metoprolol (TOPROL-XL) 50 MG 24 hr tablet Take 50 mg by mouth daily.        . Multiple Vitamin (MULTIVITAMIN) tablet Take 1 tablet by mouth daily.        . Multiple Vitamins-Minerals (PRESERVISION/LUTEIN) CAPS Take by mouth daily.    0  . pantoprazole (PROTONIX) 40 MG tablet Take 1 tablet (40 mg total) by mouth daily.  30 tablet    . Polyethyl Glycol-Propyl Glycol (SYSTANE OP) Apply to eye 4 (four) times daily.        . simvastatin (ZOCOR) 40 MG tablet Take 40 mg by mouth at bedtime.        . Ticagrelor (BRILINTA) 90 MG TABS Take 90 mg by mouth 2 (two) times daily.  60 tablet      No Known Allergies  Past Medical History  Diagnosis Date  . Coronary artery disease     s/p PCI to LAD 08/05/10  . Hypertension   . Diabetes mellitus   . Hyperlipidemia   . Barrett esophagus   . Diabetes mellitus   . Gallstone pancreatitis 2011  . Nephrolithiasis   . SBO (small bowel obstruction) 2005  . Colon cancer     Past Surgical History  Procedure Date  . Cholecystectomy 2011  . Right colectomy   . Coronary angioplasty with stent placement 08/05/10    DES to the LAD  . Cardiac catheterization 08/03/10  . Cardiac catheterization 08/09/10    Stent patent    History  Smoking status  . Former Smoker  . Quit date: 05/10/1994  Smokeless tobacco  . Not on file    History    Alcohol Use No    Family History  Problem Relation Age of Onset  . Cancer Mother   . Ulcers Father     Reviw of Systems:  Reviewed in the HPI.  All other systems are negative.  Physical Exam: BP 140/56  Pulse 64  Wt 171 lb (77.565 kg) The patient is alert and oriented x 3.  The mood and affect are normal.  The skin is warm and dry.  Color is normal.  The HEENT exam reveals that the sclera are nonicteric.  The mucous membranes are moist.  The carotids are 2+ without bruits.  There is no thyromegaly.  There is no JVD.  The lungs are clear.  The chest wall is non tender.  The heart exam reveals a regular rate with a normal S1 and S2.  There are no murmurs, gallops, or rubs.  The PMI is not displaced.   Abdominal exam reveals good bowel sounds.  There is no guarding or rebound.  There is no hepatosplenomegaly or tenderness.  There are no masses. His right forearm has some  superficial bruising. The radial catheter site is well-healed. There is no bruit. There is superficial bruising on the dorsum of his forearm. There is no associated hematoma. There is no tenderness. There is no erythemia.  The legs are without rashes.  The distal pulses are intact.  Cranial nerves II - XII are intact.  Motor and sensory functions are intact.  The gait is normal.  ECG:  Assessment / Plan:

## 2010-09-18 NOTE — Telephone Encounter (Signed)
Pt to be put in today to be seen by dr Elease Hashimoto. Alfonso Ramus RN

## 2010-09-18 NOTE — Telephone Encounter (Signed)
HAD SURGERY RECENTLY AND ON ARM WHERE THEY WENT INTO TO OUT STENTS, IS NOT GETTING ANY BETTER, STILL DARK RED IT WAS A MONTH SINCE IT WAS DONE AND  HAS JUST COME UP IN TO LAST 2 WEEKS & NOT GOING AWAY.

## 2010-09-18 NOTE — Assessment & Plan Note (Signed)
[name] presents with superficial bruising on his right arm. The right radial catheter site is well-healed. There is no evidence of hematoma. There is no evidence of a pseudoaneurysm. Most of the bruising is on the dorsum of the right arm. I suspect that he has had some minor trauma and that the bruising is related to the fact that he was on Brilinta and his age of 85 years.  I reassured him that this was fairly normal for patients on Brilinta.  He denies any chest pain or shortness breath. I'll see him at his regular scheduled appointment in 3 months.

## 2010-09-18 NOTE — Progress Notes (Signed)
Pt has positive radial and brachial pulse on left and right arm.Alfonso Ramus RN

## 2010-09-25 NOTE — Discharge Summary (Signed)
NAMEDAQUON, GREENLEAF             ACCOUNT NO.:  [id number]   MEDICAL RECORD NO.:  [id number]          PATIENT TYPE:  INP   LOCATION:  6524                         FACILITY:  [facility]   PHYSICIAN:  Vesta Mixer, M.D. DATE OF BIRTH:  October 31, 1923   DATE OF ADMISSION:  08/26/2004  DATE OF DISCHARGE:  08/27/2004                                 DISCHARGE SUMMARY   DISCHARGE DIAGNOSES:  1.  Non-cardiac chest pain.  2.  History of moderate coronary artery disease.  3.  Hyperlipidemia.  4.  Hypertension.  5.  Diabetes mellitus.   DISCHARGE MEDICATIONS:  1.  Actos 30 mg daily.  2.  Zocor 40 mg daily.  3.  Toprol XL 50 mg daily.  4.  Lisinopril 10 mg daily.  5.  Nitroglycerin 0.4 mg sublingual p.r.n.   DISPOSITION:  The patient is to see Dr. Elease Hashimoto in one to two weeks for a  follow up appointment.   HISTORY:  Mr. Risinger is a 77 year old gentleman with a history of moderate  coronary artery disease.  Please see dictated H&P for further details.   HOSPITAL COURSE:  QUESTION OF CORONARY ARTERY DISEASE:  The patient ruled  out for myocardial infarction.  His EKG remained unchanged.  He had an  elevated total CPK, but a negative CPK-MB.  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  There is no evidence of  myocardial infarction.  He will be discharged in satisfactory condition.  We  will see again in one or two weeks.  All of his other medical problems are  stable.      PJN/MEDQ  D:  08/27/2004  T:  08/27/2004  Job:  [id number]

## 2010-09-25 NOTE — Op Note (Signed)
NAME:  [name], [name]                       ACCOUNT NO.:  [id number]   MEDICAL RECORD NO.:  [id number]                   PATIENT TYPE:  AMB   LOCATION:  NESC                                 FACILITY:  [facility]   PHYSICIAN:  Excell Seltzer. Annabell Howells, M.D.                 DATE OF BIRTH:  04-19-1924   DATE OF PROCEDURE:  10/18/2002  DATE OF DISCHARGE:                                 OPERATIVE REPORT   OPERATION/PROCEDURE:  1. Cystoscopy.  2. Left ureter stone extraction.   PREOPERATIVE DIAGNOSIS:  Left urethrovesical junction stones.   SURGEON:  Excell Seltzer. Annabell Howells, M.D.   ANESTHESIA:  General.   SPECIMENS:  Stone.   COMPLICATIONS:  None.   INDICATIONS:  [name] is a 77 year old white male with a history of  stones.  He was originally treated for a left proximal ureteral stone with  lithotripsy.  He had partial fragmentation.  One fragment lodged in the  distal ureter.  He then underwent a second lithotripsy with some residual  proximal fragments as well as the distal fragment.  Interestingly the  proximal fragments cleared but the distal fragments persisted.  He has had  persistent mild hydronephrosis and has not cleared despite several months of  observation at his request.  It was felt the ureteroscopic stone extraction  would be the most appropriate treatment at this point in time.   DESCRIPTION OF PROCEDURE:  The patient was taken to the operating room and  was given antibiotics.  He was placed on the table and general anesthetic  was induced.  He was then positioned in the lithotomy position.  His  peritoneum and genitalia were prepped with Betadine solution and he was  draped in the sterile fashion.  The 6-French short ureteroscope was passed  without difficulty per urethra.  The left ureteral orifice was identified.  It was widely patent and the scope was easily passed.  A stone was noted in  the distal ureter.  This was grasped with a Nytnol basket and removed  without  difficulty.  The stone measured approximately 5 mm.  The scope was  then passed to insure that all fragments were removed.  A second smaller  stone measuring approximately 3-4 mm was identified and removed with the  basket.  The scope was then advanced to its proximal limits and no other  stones were identified.  Inspection of the ureter after the procedure  revealed no significant injury, inflammation or other problems that would  require a stent. There was minimal blood oozing at the meatus.  At this  point the ureteroscope was removed and a 22-French cystoscope was passed  under direct vision with the 12-degree lens.  Examination revealed a normal  urethra and intact external sphincter.  The prostatic urethra had mild BPH  without significant obstruction.  Examination of the bladder with the 12-  and 70-degree lenses revealed mild trabeculation.  No  mucosal  lesions and the ureteral orifices were in their normal anatomic position.  At this point the bladder was drained, the scope was removed and the patient  was taken down from the lithotomy position.  His anesthetic was reversed.  He was removed to the recovery room in stable condition.  There were no  complications.                                               Excell Seltzer. Annabell Howells, M.D.    JJW/MEDQ  D:  10/18/2002  T:  10/18/2002  Job:  [id number]

## 2010-09-25 NOTE — Op Note (Signed)
NAME:  LINARD, DAFT                       ACCOUNT NO.:  [id number]   MEDICAL RECORD NO.:  [id number]                   PATIENT TYPE:  INP   LOCATION:  0009                                 FACILITY:  [facility]   PHYSICIAN:  Anselm Pancoast. Zachery Dakins, M.D.          DATE OF BIRTH:  1923/05/14   DATE OF PROCEDURE:  07/09/2003  DATE OF DISCHARGE:                                 OPERATIVE REPORT   PREOPERATIVE DIAGNOSIS:  1. Partial small bowel obstruction, hopefully secondary to adhesions.  2. History of colon cancer, right, with recurrence.  Treated with surgery,     chemotherapy, and radiation therapy.   POSTOPERATIVE DIAGNOSIS:  Small bowel high-grade partial obstruction ileum  secondary to adhesions and possibly an element of radiation neuritis.   OPERATION:  Exploratory laparotomy with lysis of adhesions and small bowel  resection with anastomosis.   ANESTHESIA:  General.   SURGEON:  Anselm Pancoast. Zachery Dakins, M.D.   ASSISTANT:  Lebron Conners, M.D.   HISTORY:  Dantrell Schertzer is a 77 year old male who Dr. Rolene Course, I think,  operated on in 1992 to the right colon for colon cancer and then a year  later after chemotherapy, he had recurrent disease and was reoperated on.  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  Then chemotherapy afterwards and radiation  therapy.  Patient has done well and is followed by Dr. Cyndie Chime and has  his serial colonoscopies, but recently over the last year or two has had  recurring episodes of a bloating and cramping, partially distended,  sometimes vomiting, and has been admitted several times.  I first met him  approximately two months ago when he was admitted by Dr. Valentina Lucks at that  time.  On NG suctioning, his symptoms subsided, and then a small bowel  series showed an area of stricture in the distal small bowel.  The patient  wanted to wait, but has had recurrent episodes and has been back in  the  emergency room a time or two and is scheduled today for this planned  procedure.  We did do a colon prep of GoLYTELY over a two-day prep, and he  is now ready for surgery.  He was taken to the OR suite and given 3 gm of  Unasyn and SPAF stockings.  Dr. Rolene Course had operated through a transverse  incision.  I made mine up and down, starting below the umbilicus, crossing  the old incision and kind of carefully opening to the peritoneal cavity.  With the patient asleep, you can feel fullness to the right, but in the  opening into the peritoneal cavity, there was no evidence of any studding of  the peritoneal cavity.  This looked like a big wad of small bowel that was  just densely adherent and kind of twisted in on itself.  We extended the  excision a little bit above the  umbilicus and slightly followed inferiorly  and then started a very tedious, long dissection in trying to free up the  small bowel.  Could identify the anastomosis where the terminal ileum goes  into the transverse incision and the wad of adhesions were kind of down  along the root of the mesentery.  We found one area that was definitely  upholding a partial obstruction, really kind of twisted in, and then the  actual point, I think of definite obstruction, was a segment of the ileum  that was kind of up under the duodenum.  I am sure this is where this  recurrent tumor was and in trying to free it up, it was very difficult.  There were numerous clips from Dr. Victoriano Lain surgery and some changes,  probably radiation changes.  We were kind of into the small bowel right at  the area trying to free it up when the mesentery was finally freed.  I  elected to resect about a 6 inch segment.  We were able to stay close to the  mesentery and did not appear to compromise the distal ileum, so we could  just do a staple side-to-side functioning anastomosis at that point.  Fortunately, the mesentery kind of falls back into this area.   There was one  little area of mucosa that was lightly coagulated with good hemostasis and  had been 3 or 4 little short vessels over the mesentery that had been  ligated with 2-0 silk in doing this little immature resection.  I then  placed the small bowel back into anatomical position.  He has an NG tube  through the nose and to the stomach.  The omentum was placed over the small  bowel, and then the incision after thoroughly irrigating everything, was  closed with interrupted #1 Novofil.  I checked the gallbladder, and there  were no stones, no evidence of any liver metastasis, but they have not been  seeing any on CT preoperatively.  Will send this specimen for examination.  Think that it is going to be benign, as I could not feel anything that was  obviously tumor nodules.  There was a little thickening that was probably  radiation fibrosis.  This extensive small bowel incision, probably 2-1/2  hours of lysis of adhesions because of the marked scarring from the two  previous surgeries and the radiation therapy.  Sponge and needle counts were  correct x2.  Estimated blood loss was probably 150 to 200 cc, if that much.                                               Anselm Pancoast. Zachery Dakins, M.D.    WJW/MEDQ  D:  07/09/2003  T:  07/09/2003  Job:  [id number]   cc:   Thora Lance, M.D.  [address]  [place]  [place] [address]  Fax: [phone]

## 2010-09-25 NOTE — H&P (Signed)
NAME:  [name], [name]                       ACCOUNT NO.:  [id number]   MEDICAL RECORD NO.:  [id number]                   PATIENT TYPE:  INP   LOCATION:  0009                                 [facility]AC[facility]   PHYSICIAN:  Anselm Pancoast. Zachery Dakins, M.D.          DATE OF BIRTH:  05-Jun-1923   DATE OF ADMISSION:  07/09/2003  DATE OF DISCHARGE:                                HISTORY & PHYSICAL   CHIEF COMPLAINT:  Partial small bowel obstruction hopefully secondary to  adhesions.  He has had a previous colon cancer of the right colon and  recurrent mesenteric involvement approximately 12 and 11 years ago.   HISTORY OF PRESENT ILLNESS:  Dr. Kirby Funk is this patient's regular  physician and I first met the patient back in January when he was admitted  by Dr. Valentina Lucks with a small bowel obstruction.  He gives a history of more  recent episodes of bloating and cramping. He becomes very distended.  He has  been to the emergency room several times for these symptoms which will kind  of gradually subside, but he has had several that have required admissions;  some actually NG suctioning.  CT's and small bowel series looked like benign  stricture in the distal small bowel and he is admitted now for elective  surgery.   PAST MEDICAL HISTORY:  1. History of mild hypertension.  2. Elevated cholesterol.  3. Type 2 diabetes controlled with diet.   Please refer to the recent history and physical for details.   MEDICATIONS:  1. Lisinopril 10 mg.  2. Toprol 50 mg.  3. Zocor 40 mg a day.   ALLERGIES:  None.   SOCIAL HISTORY:  He denies alcohol and tobacco use.   PHYSICAL EXAMINATION:  GENERAL:  This is a well developed, average size  elderly male in no acute distress.  He is well hydrated.  VITAL SIGNS:  Temperature is 97.1, pulse 85, respirations 20, blood pressure  161/69, 02 saturation is 96%.  LYMPHATICS:  There is no cervical or supraclavicular lymphadenopathy.  CHEST:  Good breath  sounds bilaterally.  CARDIAC:  Normal sinus rhythm.  ABDOMEN:  He has kind of a soft abdomen which is not acutely tender in any  area.  He has a large transverse incision below the umbilicus that extends a  little more to the right. I do not appreciate any hernias.  RECTAL:  In the office and during recent hospitalizations his stools have  been Hemoccult negative.  CNS:  Normal.  EXTREMITIES:  In his lower extremities there is no pedal edema or  circulation problem.   ADMISSION IMPRESSION:  1. Partial small bowel obstruction with distant history of right colon     cancer with mesenteric reoccurrence which was 12 and 11 years ago,     respectively.   PLAN:  The patient is for exploratory laparotomy and hopefully lysis of  adhesions.  He understands that he may need  a resection of the small bowel  and we have prepped him in case we have to do another small bowel colon  anastomosis.                                               Anselm Pancoast. Zachery Dakins, M.D.    WJW/MEDQ  D:  07/09/2003  T:  07/09/2003  Job:  [id number]

## 2010-09-25 NOTE — H&P (Signed)
NAMEDARSH, VANDEVOORT NO.:  [id number]   MEDICAL RECORD NO.:  [id number]          PATIENT TYPE:  EMS   LOCATION:  MAJO                         FACILITY:  [facility]   PHYSICIAN:  Vesta Mixer, M.D. DATE OF BIRTH:  1923/10/27   DATE OF ADMISSION:  08/26/2004  DATE OF DISCHARGE:                                HISTORY & PHYSICAL   [name] is a middle-aged gentleman with a history of moderate coronary  artery disease.  He is admitted today with episodes of chest tightness,  radiating down his left arm and up into his neck.   [name] is a 77 year old gentleman with a history of moderate coronary  artery disease.  He has LAD stenosis that we have followed for years.  He  has had negative Cardiolite studies in the past.  He has been fairly active  and has been digging a fish pond for the past several days.  This morning he  had some lightheadedness and dizziness.  This was associated with some neck  pain, some chest tightness, as well as some left arm pain.  The pains have  been occurring for several days and they got worse today.  He also noticed  some mild hypertension this morning.  He presents to the emergency room for  further evaluation.   CURRENT MEDICATIONS:  1.  Actos 30 mg a day.  2.  Zocor 40 mg a day.  3.  Toprol XL 50 mg a day.  4.  Lisinopril 10 mg a day.   ALLERGIES:  None.   PAST MEDICAL HISTORY:  1.  Moderate coronary artery disease.  2.  History of diabetes mellitus disease.   SOCIAL HISTORY:  The patient is a nonsmoker.  He drinks alcohol rarely.   FAMILY HISTORY:  His brother had some cardiac disease.   REVIEW OF SYSTEMS:  Reviewed and is essentially negative.   PHYSICAL EXAMINATION:  GENERAL:  He is a middle-aged gentleman in no acute  distress.  VITAL SIGNS:  His heart rate is 60, blood pressure is 157/70.  HEENT:  Reveals 2+ carotids.  He has soft bilateral carotid bruits.  LUNGS:  Clear to auscultation.  HEART:  Regular  rate.  S1 S2.  He has a very soft systolic murmur.  ABDOMEN:  Reveals good bowel sounds and is nontender.  EXTREMITIES:  There is no clubbing, cyanosis, or edema.   His EKG reveals a normal sinus rhythm.  He has a right bundle branch block.  He has no acute ST-T wave changes.   LABORATORY DATA:  Pending.   1.  The patient presents with some atypical episodes of chest pain.  He does      have moderate coronary artery disease.  I would like to check cardiac      enzymes for further evaluation.  If he does well, then we should be able      to discharge him from home after this 23-hour admission.  Otherwise we      will need to plan on doing a heart catheterization.  We quite likely  will do a Cardiolite study as an outpatient if he remains stable.  2.  Hypercholesterolemia.  The patient remains fairly stable.  We will check      a lipid profile in the office.  3.  Right bundle branch block with premature ventricular contractions,      stable.      PJN/MEDQ  D:  08/26/2004  T:  08/26/2004  Job:  [id number]

## 2010-09-25 NOTE — Discharge Summary (Signed)
NAME:  [name], [name]                       ACCOUNT NO.:  [id number]   MEDICAL RECORD NO.:  [id number]                   PATIENT TYPE:  INP   LOCATION:  0379                                 FACILITY:  [facility]   PHYSICIAN:  Anselm Pancoast. Zachery Dakins, M.D.          DATE OF BIRTH:  Oct 25, 1923   DATE OF ADMISSION:  07/09/2003  DATE OF DISCHARGE:  07/16/2003                                 DISCHARGE SUMMARY   DISCHARGE DIAGNOSIS:  High-grade partial small bowel obstruction secondary  to adhesions and previous extensive intra-abdominal surgery with radiation  therapy.   HISTORY:  Mr. Urey is a 77 year old male who approximately 12 years ago  had a right colectomy by Dr. Sherryl Manges for a colon cancer, received  chemotherapy postoperatively, had a reoccurrence within the mesentery  detected approximately a year later, and had an extensive resection of this.  Dr. Victoriano Lain note describes it going down around the aorta and vena cava,  and he was irradiated postoperatively.  The patient has done well with no  evidence of recurrent tumor, been followed by Dr. Cyndie Chime with serial  colonoscopies and other tests, and recently has had increase in frequencies  of a crampy, bloating, by a lot of different foods, causing him to swell up.  He was admitted one time, treated with NG tube approximately six weeks ago,  and then he has had another couple of episodes that he had to go to the  emergency room.  He is followed by Dr. Kirby Funk, his regular physician,  who admitted him approximately six weeks ago when I first saw him, and the  CT scan did not show any evidence of a recurrent cancer.  A small bowel  series showed a definite narrowing in the distal small bowel, and because of  the increase in symptoms, he is admitted at this time for hopefully lysis of  adhesions.  He is widowed I think and lives alone, and arrangements have  been made for his sister to check on him postoperatively.   MEDICATIONS:  1. Lisinopril 10 mg daily.  2. Toprol 50.  3. Zocor 40 mg daily.   SOCIAL HISTORY:  He denies alcohol and tobacco use.   HOSPITAL COURSE:  The patient was taken to surgery.  Dr. Marcy Panning  assisted, and upon opening the abdomen we were surprised that the distal  small bowel was just basically densely adherent, two areas of significant  narrowing, the most marked was a little area located right under the  duodenum where the metastatic tumor had been, and it was at this point that  it appeared to be the actual point, but there were a couple of other areas  of significant narrowing, and I am surprised that he was not having more  symptoms according to the intra-abdominal finding.  We lysed and  straightened out everything.  There was a short segment that was necessary  to resect, and a functional side-to-side  anastomosis was performed.  The  excised specimen is benign, but they note a chronic ulceration in this, and  this is probably secondary to food kind of lodging in the area.  There was  no evidence of any metastatic cancer in the _________ specimen, and no  evidence of any metastatic cancer grossly at the time of surgery.  The  patient had an NG tube with bilious NG drainage for about 4 or 5 days, and  then his bowel function returned.  The NG tube was removed.  His Foley  catheter was removed.  He was advanced on a clear liquid and then a full  liquid diet which he has tolerated without problems.  I would recommend that  we try to keep him on a full liquid soft mechanical for approximately 48  hours, and then kind of gradually advance him up to a regular diet.  He has  been on telemetry postoperatively, and he had one little short episode of  PVCs, maybe three beats of ventricular tachycardia, but otherwise has been  hemodynamically stable and has resumed his chronic medications.  He says he  does not need any pain medications.  He has some in case he would need it,   where he had been treating himself for these cramping  episodes, but I think the texture and the gradual increase in his diet is  the solution.  His staples have been removed, wound steri-striped, and he  will return to see me in the office in approximately one week.  He  understands he should not drive for probably 3 or 4 days, and not if he is  taking pain medications.                                               Anselm Pancoast. Zachery Dakins, M.D.    WJW/MEDQ  D:  07/16/2003  T:  07/16/2003  Job:  [id number]   cc:   Thora Lance, M.D.  [address]  [place]  [place] [address]  Fax: [phone]   Genene Churn. Cyndie Chime, M.D.  [address] [facility]  [place]  [place] [address]  Fax: [phone]

## 2010-09-25 NOTE — Discharge Summary (Signed)
NAME:  [name], [name]                       ACCOUNT NO.:  [id number]   MEDICAL RECORD NO.:  [id number]                   PATIENT TYPE:  INP   LOCATION:  0443                                 FACILITY:  [facility]   PHYSICIAN:  Thora Lance, M.D.               DATE OF BIRTH:  January 25, 1924   DATE OF ADMISSION:  06/12/2003  DATE OF DISCHARGE:  06/15/2003                                 DISCHARGE SUMMARY   REASON FOR ADMISSION:  This is a 77 year old white male who presented with  the sudden onset of lower abdominal pain, nausea and vomiting, the evening  of admission.  He had many milder episodes that were similar to this lasting  one to two days over recent years.  He had a history of colon cancer in  1992, with recurrence in 1993, status post right colectomy, XRT, and  chemotherapy, considered to be disease-free.   PHYSICAL EXAMINATION:  VITAL SIGNS:  Blood pressure 130/60, heart rate 100,  afebrile.  LUNGS:  Clear.  HEART:  Regular rate and rhythm.  ABDOMEN:  Soft, distended, mild tenderness, decreased bowel sounds.   LABORATORY DATA:  CBC:  WBC 10.1, hemoglobin 16.1, platelet count 201.  PT  12.2, PTT 26.  Chemistries:  Sodium 140, potassium 5.3, chloride 101,  bicarbonate 27, glucose 223, BUN 24, creatinine 1.2, calcium 7.1.  Total  protein 7.6, albumin 4.2, AST 46, ALT 26, alkaline phosphatase 38, total  bilirubin 1.5, amylase 57, lipase 21.  Acute abdominal series showed  possible mechanical small bowel obstruction and COPD with chronic-appearing  interstitial lung disease.   HOSPITAL COURSE:  The patient was admitted with a small bowel obstruction.  The patient was started on IV fluids and pain control.  A NG tube was not  initially placed.  The patient's blood pressure medications were placed on  hold.  He was placed on a sliding scale for his diabetes.  He was seen by  Dr. Zachery Dakins of the surgical service who recommended a CT scan of the  abdomen which was done on June 13, 2003.  The CT scan of the abdomen  showed right lateral renal calculi, but no evidence of small bowel  dilatation.  CT scan of the pelvis did show several dilated loops of small  bowel in the upper right pelvis, appearing to show an area of partial small  bowel obstruction.  The patient continued to clinically improve, his diet  was advanced, and intravenous fluids were discontinued.  The patient was  discharged on June 15, 2003.  Dr. Zachery Dakins of the surgical service  planned followup as an outpatient.   DISCHARGE MEDICATIONS:  1. Lisinopril 10 mg daily.  2. Toprol XL 50 mg daily.  3. Zocor 40 mg daily.   ACTIVITY:  No restrictions.   DIET:  Diabetic diet.   FOLLOWUP:  1. One week with Dr. Valentina Lucks.  2. Follow up with Dr. Zachery Dakins as  per Dr. Zachery Dakins.                                               Thora Lance, M.D.    Delorse Limber  D:  08/04/2003  T:  08/04/2003  Job:  [id number]   cc:   Anselm Pancoast. Zachery Dakins, M.D.  [address], [address]  [place]  [place] [address]  Fax: [phone]

## 2010-09-25 NOTE — Op Note (Signed)
   NAME:  [name], [name]                       ACCOUNT NO.:  [id number]   MEDICAL RECORD NO.:  [id number]                   PATIENT TYPE:  AMB   LOCATION:  ENDO                                 FACILITY:  [facility]   PHYSICIAN:  John C. Madilyn Fireman, M.D.                 DATE OF BIRTH:  20-Aug-1923   DATE OF PROCEDURE:  07/18/2002  DATE OF DISCHARGE:                                 OPERATIVE REPORT   PROCEDURE:  Colonoscopy.   INDICATIONS FOR PROCEDURE:  History of colon cancer, status post right  hemicolectomy with last surveillance colonoscopy four years ago.   DESCRIPTION OF PROCEDURE:  The patient was placed in the left lateral  decubitus position and placed on the pulse monitor with continuous low-flow  oxygen delivered by nasal cannula.  He was sedated with 100 mcg IV fentanyl  and 7.5 mg IV Versed.  The Olympus video colonoscope was inserted into the  rectum and advanced to the ileocolonic anastomosis which was easily  identifiable.  The terminal ileum was entered and explored for several  centimeters and appeared to be within normal limits.  There appeared to be  no anastomotic abnormalities, and no biopsies were taken.  The remaining  portions of the transverse colon as well as the descending colon appeared  normal with no masses, polyps, diverticula, or other mucosal abnormalities.  Within the sigmoid colon, there were seen some scattered diverticula and no  other abnormalities.  The rectum appeared normal, and retroflexed view of  the anus did reveal some prominent internal hemorrhoids as well as prominent  anal papillae.  The scope was then withdrawn and the patient returned to the  recovery room in stable condition.  He tolerated the procedure well, and  there were no immediate complications.    IMPRESSION:  1. Diverticulosis.  2. Internal hemorrhoids in anal papillae.   PLAN:  Repeat colonoscopy in five years.                                               John C. Madilyn Fireman,  M.D.    JCH/MEDQ  D:  07/18/2002  T:  07/18/2002  Job:  [id number]   cc:   Thora Lance, M.D.  [address]  [place]  [place] [address]  Fax: [phone]   Genene Churn. Cyndie Chime, M.D.  [address] [facility]  [place]  [place] [address]  Fax: [phone]

## 2010-09-25 NOTE — Consult Note (Signed)
NAME:  [name], [name]                       ACCOUNT NO.:  [id number]   MEDICAL RECORD NO.:  [id number]                   PATIENT TYPE:  INP   LOCATION:  0443                                 FACILITY:  [facility]   PHYSICIAN:  Anselm Pancoast. Zachery Dakins, M.D.          DATE OF BIRTH:  [birth year]   DATE OF CONSULTATION:  DATE OF DISCHARGE:                                   CONSULTATION   CHIEF COMPLAINT:  Abdominal pain, cramping recurring. Admitted by Dr.  Valentina Lucks for small bowel obstruction, clinically improved.   HISTORY:  Mr. [name] is a 77 year old male who underwent a right  colectomy for colon cancer by Dr. Rolene Course in 1992. He had recurrence in the  mesentery approximately a year later that required repeat operation and  postoperatively received radiation therapy. He has been free of cancer  following this radiation therapy and chemotherapy. I think he was originally  seen by Dr. Cyndie Chime and over the last year or two he has had  intermittent episodes of crampy right lower quadrant pain and was admitted  with a similar episode by Dr. Valentina Lucks on the 2nd. The patient states that  this day he had nausea and vomiting and has had many milder episodes  sometimes associated with diarrhea and was admitted. He did not have an NG  tube placed. Abdominal films showed a dilated loop of small bowel and the  following morning, clinically, he was improved and Dr. Valentina Lucks asked that I  see him. On questioning, the patient did not recent weight loss. He  certainly did not act as if he has got a persistent or recurrent tumor, but  these episodes of cramping and bloating are certainly slightly increased  with frequency.  His last colonoscopy was on July 18, 2002, at which time  the terminal ileum was identified and there was no evidence of any lesions  in the colon and the last CAT scan had been approximately two years ago. I  recommend that we obtain a repeat CAT scan which was done later  yesterday  and this showed a definite dilated loop of small bowel in the right lower  quadrant where the patient complains of pain, in what looks like either a  stricture or possibly a short segment of radiation cellulitis in the right  lower quadrant. However, the contrast definitely does go through into the  colon. I have discussed with the patient that I would recommend that if he  is continuing to have recurrent episodes he is going to need exploratory  laparotomy for release. If it is radiation cellulitis it would require a  short segment of intestine to be resected and he does not appear to be a  definite obstruction ________ certain at this hospitalization. I reviewed  the x-rays last night with Dr. Karin Golden, the night radiologist, and we were  more impressed with the findings that the official reading by Dr. Carmina Miller. Dr. Darcel Bayley had  located the previous CT from 2003 and stated that a  similar area was noted on that CT, so this obviously is not progressing  rapidly.   I would recommend: 1) advancing his diet on up if he is continuing to having  symptoms and he needs a colon prep in preparation for an elective  exploratory laparotomy and either release of the adhesion or possible short  segment of resection should be entertained. We do not feel that this is  recurrent colon cancer as there has been no evidence of  any mass lesions on the recent CT, colonoscopy, or CT two years earlier. I  think the patient plans to advance his diet. We will follow up with me in  the office and if surgery is needed, we will be re-admitted electively for  the planned procedure.                                               Anselm Pancoast. Zachery Dakins, M.D.    WJW/MEDQ  D:  06/14/2003  T:  06/14/2003  Job:  [id number]   cc:   Thora Lance, M.D.  [address]  [place]  [place] [address]  Fax: [phone]

## 2010-12-09 ENCOUNTER — Ambulatory Visit: Payer: Medicare Other | Admitting: Cardiovascular Disease

## 2010-12-10 ENCOUNTER — Encounter: Payer: Self-pay | Admitting: Cardiovascular Disease

## 2010-12-10 ENCOUNTER — Ambulatory Visit (INDEPENDENT_AMBULATORY_CARE_PROVIDER_SITE_OTHER): Payer: Medicare Other | Admitting: Cardiovascular Disease

## 2010-12-10 VITALS — BP 148/60 | HR 48 | Ht 69.0 in | Wt 170.2 lb

## 2010-12-10 DIAGNOSIS — I2510 Atherosclerotic heart disease of native coronary artery without angina pectoris: Secondary | ICD-10-CM

## 2010-12-10 DIAGNOSIS — Z955 Presence of coronary angioplasty implant and graft: Secondary | ICD-10-CM

## 2010-12-10 DIAGNOSIS — Z9861 Coronary angioplasty status: Secondary | ICD-10-CM

## 2010-12-10 DIAGNOSIS — E785 Hyperlipidemia, unspecified: Secondary | ICD-10-CM

## 2010-12-10 MED ORDER — ATORVASTATIN CALCIUM 40 MG PO TABS: 40.0000 mg | ORAL_TABLET | Freq: Every day | ORAL | Status: DC

## 2010-12-10 NOTE — Assessment & Plan Note (Signed)
Is doing better will Brilinta. We'll continue the same medications.

## 2010-12-10 NOTE — Progress Notes (Signed)
[name] Date of Birth  06-26-1923 [facility] [address]     [address] [place], [place]  [address] [phone]  Fax  [phone]  History of Present Illness:  [name] is an 77 year old gentleman with a history of coronary artery disease. Also has a history of diabetes mellitus and hypertension. He has a history of hypercholesterolemia.  He was hospitalized in May with unstable angina. He had PTCA and stenting. [name] been on Brilinta and has tolerated it very well.  He has occasional episodes of shortness breath associated with high glucose level.  He is getting back on exercise on fairly regular basis. He is walking several times a day without any difficulty.  Current Outpatient Prescriptions on File Prior to Visit  Medication Sig Dispense Refill  . aspirin 81 MG tablet Take 81 mg by mouth daily.        . Cholecalciferol (VITAMIN D-3 PO) Take by mouth daily.        [name] glimepiride (AMARYL) 4 MG tablet Take 4 mg by mouth daily before breakfast.        . lisinopril-hydrochlorothiazide (PRINZIDE,ZESTORETIC) 20-12.5 MG per tablet Take 1 tablet by mouth daily.        . metoprolol (TOPROL-XL) 50 MG 24 hr tablet Take 50 mg by mouth daily.        . Multiple Vitamin (MULTIVITAMIN) tablet Take 1 tablet by mouth daily.        . Multiple Vitamins-Minerals (PRESERVISION/LUTEIN) CAPS Take by mouth daily.    0  . pantoprazole (PROTONIX) 40 MG tablet Take 1 tablet (40 mg total) by mouth daily.  30 tablet    . Polyethyl Glycol-Propyl Glycol (SYSTANE OP) Apply to eye 4 (four) times daily.        . simvastatin (ZOCOR) 40 MG tablet Take 40 mg by mouth at bedtime.        . Ticagrelor (BRILINTA) 90 MG TABS Take 90 mg by mouth 2 (two) times daily.  60 tablet      No Known Allergies  Past Medical History  Diagnosis Date  . Coronary artery disease     s/p PCI to LAD 08/05/10  . Hypertension   . Diabetes mellitus   . Hyperlipidemia   . Barrett esophagus    . Diabetes mellitus   . Gallstone pancreatitis 2011  . Nephrolithiasis   . SBO (small bowel obstruction) 2005  . Colon cancer     Past Surgical History  Procedure Date  . Cholecystectomy 2011  . Right colectomy   . Coronary angioplasty with stent placement 08/05/10    DES to the LAD  . Cardiac catheterization 08/03/10  . Cardiac catheterization 08/09/10    Stent patent    History  Smoking status  . Former Smoker  . Quit date: 05/10/1994  Smokeless tobacco  . Not on file    History  Alcohol Use No    Family History  Problem Relation Age of Onset  . Cancer Mother   . Ulcers Father     Reviw of Systems:  Reviewed in the HPI.  All other systems are negative.  Physical Exam: BP 148/60  Pulse 48  Ht 5\' 9"  (1.753 m)  Wt 170 lb 3.2 oz (77.202 kg)  BMI 25.13 kg/m2 The patient is alert and oriented x 3.  The mood and affect are normal.   Skin: warm and dry.  Color is normal.    HEENT:   the sclera are nonicteric.  The mucous membranes are moist.  The carotids are 2+ without bruits.  There is no thyromegaly.  There is no JVD.    Lungs: clear.  The chest wall is non tender.    Heart: regular rate with a normal S1 and S2.  There are no murmurs, gallops, or rubs. The PMI is not displaced.     Abdomen: good bowel sounds.  There is no guarding or rebound.  There is no hepatosplenomegaly or tenderness.  There are no masses.   Extremities:  no clubbing, cyanosis, or edema.  The legs are without rashes.  The distal pulses are intact.   Neuro:  Cranial nerves II - XII are intact.  Motor and sensory functions are intact.    The gait is normal.  ECG:  Assessment / Plan:

## 2010-12-10 NOTE — Assessment & Plan Note (Signed)
[name] doing well. We'll continue the Brilinta lifelong since he has a drug-eluting stent.

## 2011-02-05 ENCOUNTER — Emergency Department (HOSPITAL_COMMUNITY)
Admission: EM | Admit: 2011-02-05 | Discharge: 2011-02-05 | Disposition: A | Discharge status: Home / Self Care | Payer: Medicare Other | Attending: Emergency Medicine | Admitting: Emergency Medicine

## 2011-02-05 ENCOUNTER — Emergency Department (HOSPITAL_COMMUNITY): Payer: Medicare Other

## 2011-02-05 DIAGNOSIS — I2510 Atherosclerotic heart disease of native coronary artery without angina pectoris: Secondary | ICD-10-CM | POA: Insufficient documentation

## 2011-02-05 DIAGNOSIS — R0789 Other chest pain: Secondary | ICD-10-CM | POA: Insufficient documentation

## 2011-02-05 DIAGNOSIS — E119 Type 2 diabetes mellitus without complications: Secondary | ICD-10-CM | POA: Insufficient documentation

## 2011-02-05 DIAGNOSIS — R1013 Epigastric pain: Secondary | ICD-10-CM | POA: Insufficient documentation

## 2011-02-05 DIAGNOSIS — I10 Essential (primary) hypertension: Secondary | ICD-10-CM | POA: Insufficient documentation

## 2011-02-05 DIAGNOSIS — I498 Other specified cardiac arrhythmias: Secondary | ICD-10-CM | POA: Insufficient documentation

## 2011-02-05 DIAGNOSIS — I4510 Unspecified right bundle-branch block: Secondary | ICD-10-CM | POA: Insufficient documentation

## 2011-02-05 DIAGNOSIS — I4949 Other premature depolarization: Secondary | ICD-10-CM | POA: Insufficient documentation

## 2011-02-05 DIAGNOSIS — M542 Cervicalgia: Secondary | ICD-10-CM | POA: Insufficient documentation

## 2011-02-05 LAB — DIFFERENTIAL
Eosinophils Relative: 5 % (ref 0–5)
Lymphocytes Relative: 14 % (ref 12–46)
Lymphs Abs: 0.7 10*3/uL (ref 0.7–4.0)
Monocytes Absolute: 0.6 10*3/uL (ref 0.1–1.0)
Monocytes Relative: 14 % — ABNORMAL HIGH (ref 3–12)
Neutro Abs: 3.1 10*3/uL (ref 1.7–7.7)

## 2011-02-05 LAB — BASIC METABOLIC PANEL
BUN: 24 mg/dL — ABNORMAL HIGH (ref 6–23)
Chloride: 106 mEq/L (ref 96–112)
GFR calc Af Amer: >60 mL/min (ref >60)
GFR calc non Af Amer: 53 mL/min — ABNORMAL LOW (ref >60)
Potassium: 4 mEq/L (ref 3.5–5.1)
Sodium: 140 mEq/L (ref 135–145)

## 2011-02-05 LAB — CBC
HCT: 31.2 % — ABNORMAL LOW (ref 39.0–52.0)
Hemoglobin: 11 g/dL — ABNORMAL LOW (ref 13.0–17.0)
MCH: 35.1 pg — ABNORMAL HIGH (ref 26.0–34.0)
MCHC: 35.3 g/dL (ref 30.0–36.0)
MCV: 99.7 fL (ref 78.0–100.0)
RDW: 12.7 % (ref 11.5–15.5)

## 2011-02-05 LAB — APTT: aPTT: 31 seconds (ref 24–37)

## 2011-02-05 LAB — PROTIME-INR
INR: 1.13 (ref 0.00–1.49)
Prothrombin Time: 14.7 seconds (ref 11.6–15.2)

## 2011-02-12 LAB — INFLUENZA A+B VIRUS AG-DIRECT(RAPID)
Inflenza A Ag: NEGATIVE
Influenza B Ag: NEGATIVE

## 2011-03-01 ENCOUNTER — Other Ambulatory Visit: Payer: Self-pay | Admitting: Cardiovascular Disease

## 2011-03-03 ENCOUNTER — Other Ambulatory Visit (INDEPENDENT_AMBULATORY_CARE_PROVIDER_SITE_OTHER): Payer: Medicare Other | Admitting: *Deleted

## 2011-03-03 DIAGNOSIS — E785 Hyperlipidemia, unspecified: Secondary | ICD-10-CM

## 2011-03-03 LAB — BASIC METABOLIC PANEL
CO2: 24 mEq/L (ref 19–32)
Calcium: 9 mg/dL (ref 8.4–10.5)
Creatinine, Ser: 1.2 mg/dL (ref 0.4–1.5)
GFR: 62.87 mL/min (ref >60.00)
Sodium: 139 mEq/L (ref 135–145)

## 2011-03-03 LAB — HEPATIC FUNCTION PANEL
ALT: 16 U/L (ref 0–53)
AST: 23 U/L (ref 0–37)
Albumin: 3.8 g/dL (ref 3.5–5.2)
Alkaline Phosphatase: 39 U/L (ref 39–117)
Total Bilirubin: 0.8 mg/dL (ref 0.3–1.2)

## 2011-03-03 LAB — LIPID PANEL
Total CHOL/HDL Ratio: 3
Triglycerides: 100 mg/dL (ref 0.0–149.0)

## 2011-03-05 ENCOUNTER — Ambulatory Visit (INDEPENDENT_AMBULATORY_CARE_PROVIDER_SITE_OTHER): Payer: Medicare Other | Admitting: Cardiovascular Disease

## 2011-03-05 ENCOUNTER — Encounter: Payer: Self-pay | Admitting: Cardiovascular Disease

## 2011-03-05 ENCOUNTER — Telehealth: Payer: Self-pay | Admitting: Cardiovascular Disease

## 2011-03-05 DIAGNOSIS — I10 Essential (primary) hypertension: Secondary | ICD-10-CM

## 2011-03-05 DIAGNOSIS — I2510 Atherosclerotic heart disease of native coronary artery without angina pectoris: Secondary | ICD-10-CM

## 2011-03-05 NOTE — Patient Instructions (Signed)
The current medical regimen is effective;  continue present plan and medications.  Follow up in 6 months with Dr Nahser.  You will receive a letter in the mail 2 months before you are due.  Please call us when you receive this letter to schedule your follow up appointment.  

## 2011-03-05 NOTE — Progress Notes (Signed)
[name] Date of Birth  [birth year] [facility] [address]    [address] [place], [place]  [address] [phone]  Fax  [phone]  History of Present Illness:  [name] is an 77 year old gentleman with a history of coronary artery disease. He status post stenting this past July.  [name] had a little shortness of breath since that time.  He he had one episode of chest tightness. He presented to the emergency room, was evaluated and was sent home.  Current Outpatient Prescriptions on File Prior to Visit  Medication Sig Dispense Refill  . aspirin 81 MG tablet Take 81 mg by mouth daily.        [name] atorvastatin (LIPITOR) 40 MG tablet Take 1 tablet (40 mg total) by mouth daily.  30 tablet  11  . Cholecalciferol (VITAMIN D-3 PO) Take by mouth daily.        [name] glimepiride (AMARYL) 4 MG tablet Take 4 mg by mouth daily before breakfast.        . Multiple Vitamin (MULTIVITAMIN) tablet Take 1 tablet by mouth daily.        . Multiple Vitamins-Minerals (PRESERVISION/LUTEIN) CAPS Take by mouth daily.    0  . pantoprazole (PROTONIX) 40 MG tablet Take 1 tablet (40 mg total) by mouth daily.  30 tablet    . Polyethyl Glycol-Propyl Glycol (SYSTANE OP) Apply to eye 4 (four) times daily.        . Ticagrelor (BRILINTA) 90 MG TABS Take 90 mg by mouth 2 (two) times daily.  60 tablet    . TOPROL XL 50 MG 24 hr tablet TAKE ONE TABLET BY MOUTH EVERY DAY  90 each  3    Allergies  Allergen Reactions  . Metformin And Related     Past Medical History  Diagnosis Date  . Coronary artery disease     s/p PCI to LAD 08/05/10  . Hypertension   . Diabetes mellitus   . Hyperlipidemia   . Barrett esophagus   . Diabetes mellitus   . Gallstone pancreatitis 2011  . Nephrolithiasis   . SBO (small bowel obstruction) 2005  . Colon cancer     Past Surgical History  Procedure Date  . Cholecystectomy 2011  . Right colectomy   . Coronary angioplasty with stent placement 08/05/10    DES to the LAD  .  Cardiac catheterization 08/03/10  . Cardiac catheterization 08/09/10    Stent patent    History  Smoking status  . Former Smoker  . Quit date: 05/10/1994  Smokeless tobacco  . Not on file    History  Alcohol Use No    Family History  Problem Relation Age of Onset  . Cancer Mother   . Ulcers Father     Reviw of Systems:  Reviewed in the HPI.  All other systems are negative.  Physical Exam: BP 130/78  Pulse 70  Ht 5\' 9"  (1.753 m)  Wt 169 lb (76.658 kg)  BMI 24.96 kg/m2 The patient is alert and oriented x 3.  The mood and affect are normal.   Skin: warm and dry.  Color is normal.    HEENT:   His pupils carotids. He is no JVD.  Lungs: His lungs are clear to auscultation.   Heart: Heart regular rate S1-S2.    Abdomen: His abdominal exam is benign.   Extremities:  Extremities has no clubbing cyanosis or edema.  Neuro:  Nonfocal     ECG:  Assessment / Plan:

## 2011-03-05 NOTE — Assessment & Plan Note (Addendum)
His blood pressure is fairly normal. There is some question as to whether or not he is taking lisinopril. He initially thought that we substituted the atorvastatin for  lisinopril .  We actually stopped the simvastatin and started atorvastatin.  His blood pressure is good today. We'll have him take his AVS and compare that with medicine listed today.Maryclare Labrador add lisinopril back if he is having any problems with high blood pressure.  If he is not taking lisinopril, I am not inclined to restart him on it since his blood pressure remains normal. If he develops hypertension at some point in the near future,  We can we start the lisinopril.

## 2011-03-05 NOTE — Telephone Encounter (Signed)
Pt was seen today and was to call back information regarding a prescription.  Please call him back as soon as possible.

## 2011-03-05 NOTE — Assessment & Plan Note (Addendum)
I think it is doing very well from a coronary standpoint. We'll continue the same medications.  He is to take Brilinta  lifelong since he has a drug-eluting stent.

## 2011-03-08 NOTE — Telephone Encounter (Signed)
Reviewed Dr Namon Cirri note and pt was confused with lisinopril and simvastatin. Had pt get meds and we reviewed and he doesn't have lisinopril bottle and is not on currently. Told to not take simvastatin which is cholesterol med and to take atorvastatin in its place. To check bp weekly and call if elevated and we will consider putting him on it needed in future, Pt verbalized understanding. To f/u as planned. Alfonso Ramus RN

## 2011-03-12 ENCOUNTER — Other Ambulatory Visit: Payer: Medicare Other | Admitting: *Deleted

## 2011-03-16 ENCOUNTER — Other Ambulatory Visit: Payer: Self-pay | Admitting: Nurse Practitioner

## 2011-03-17 ENCOUNTER — Other Ambulatory Visit: Payer: Self-pay | Admitting: *Deleted

## 2011-03-17 MED ORDER — PANTOPRAZOLE SODIUM 40 MG PO TBEC: 40.0000 mg | DELAYED_RELEASE_TABLET | Freq: Every day | ORAL | Status: DC

## 2011-05-02 ENCOUNTER — Other Ambulatory Visit: Payer: Self-pay | Admitting: Nurse Practitioner

## 2011-05-03 ENCOUNTER — Telehealth: Payer: Self-pay | Admitting: Cardiovascular Disease

## 2011-05-03 MED ORDER — TICAGRELOR 90 MG PO TABS: 90.0000 mg | ORAL_TABLET | Freq: Two times a day (BID) | ORAL | Status: DC

## 2011-05-03 NOTE — Telephone Encounter (Signed)
Called requesting refill on Brilinta. Sent to pharmacy- [facility] on [address].

## 2011-05-03 NOTE — Telephone Encounter (Signed)
Pt needs refill on brilinta 90mg  bid called into [facility] on [address] asap he is out Pharm has contacted [place] several times ans we have not responded. Please call pt when this done

## 2011-05-12 ENCOUNTER — Telehealth: Payer: Self-pay | Admitting: Cardiovascular Disease

## 2011-05-12 NOTE — Telephone Encounter (Signed)
C/o sob, denies cp, no LE edema, no cough. Pt able to carry on conversation, no sob noted while talking.  Frequent bowel mvmnts. Told to call pcp and he will direct him back if he feels it is a cardiac problem, Pt verbalized understanding. [name] Ramus RN

## 2011-05-12 NOTE — Telephone Encounter (Signed)
New Problem:     Patient called in with shortness of breath and frequent bowel movements, wanting to schedule an appointment to see Dr. Elease Hashimoto sometime soon. Please advise.

## 2011-05-28 ENCOUNTER — Telehealth: Payer: Self-pay | Admitting: Oncology

## 2011-05-28 NOTE — Telephone Encounter (Signed)
Talked to pt, gave her appt for 06/25/11, lab and MD

## 2011-06-24 ENCOUNTER — Other Ambulatory Visit: Payer: Self-pay | Admitting: *Deleted

## 2011-06-24 DIAGNOSIS — C187 Malignant neoplasm of sigmoid colon: Secondary | ICD-10-CM

## 2011-06-25 ENCOUNTER — Encounter: Payer: Self-pay | Admitting: Oncology

## 2011-06-25 ENCOUNTER — Other Ambulatory Visit: Payer: Medicare Other

## 2011-06-25 ENCOUNTER — Ambulatory Visit (HOSPITAL_BASED_OUTPATIENT_CLINIC_OR_DEPARTMENT_OTHER): Payer: Medicare Other | Admitting: Oncology

## 2011-06-25 ENCOUNTER — Telehealth: Payer: Self-pay | Admitting: *Deleted

## 2011-06-25 VITALS — BP 144/57 | HR 43 | Temp 97.4°F | Ht 69.0 in | Wt 170.2 lb

## 2011-06-25 DIAGNOSIS — D539 Nutritional anemia, unspecified: Secondary | ICD-10-CM

## 2011-06-25 DIAGNOSIS — C187 Malignant neoplasm of sigmoid colon: Secondary | ICD-10-CM

## 2011-06-25 DIAGNOSIS — Z85038 Personal history of other malignant neoplasm of large intestine: Secondary | ICD-10-CM

## 2011-06-25 HISTORY — DX: Nutritional anemia, unspecified: D53.9

## 2011-06-25 LAB — COMPREHENSIVE METABOLIC PANEL
ALT: 17 U/L (ref 0–53)
AST: 20 U/L (ref 0–37)
Alkaline Phosphatase: 44 U/L (ref 39–117)
Creatinine, Ser: 1.15 mg/dL (ref 0.50–1.35)
Total Bilirubin: 0.5 mg/dL (ref 0.3–1.2)

## 2011-06-25 LAB — CBC WITH DIFFERENTIAL/PLATELET
BASO%: 0.4 % (ref 0.0–2.0)
EOS%: 3.8 % (ref 0.0–7.0)
HCT: 36.3 % — ABNORMAL LOW (ref 38.4–49.9)
LYMPH%: 13.4 % — ABNORMAL LOW (ref 14.0–49.0)
MCH: 34.1 pg — ABNORMAL HIGH (ref 27.2–33.4)
MCHC: 33.8 g/dL (ref 32.0–36.0)
NEUT%: 74.7 % (ref 39.0–75.0)
Platelets: 160 10*3/uL (ref 140–400)
RBC: 3.61 10*6/uL — ABNORMAL LOW (ref 4.20–5.82)

## 2011-06-25 NOTE — Progress Notes (Signed)
Hematology and Oncology Follow Up Visit  [name] [id number] 1924-05-07 78 y.o. 06/25/2011 5:39 PM   Principle Diagnosis: Encounter Diagnoses  Name Primary?  . History of colon cancer, stage IV Yes  . Macrocytic anemia      Interim History:   Followup visit for this pleasant 78 year old man with history of colon cancer with initial stage III disease diagnosed in October 1992. Status post sigmoid colectomy followed by adjuvant chemotherapy with 5-fluorouracil. He had a retroperitoneal recurrence in July of 1993 treated aggressively with surgery, radiation, and chemotherapy. He achieved a durable remission and is likely cured.  Over the years he has developed a mild macrocytic anemia since 2007 with normal vitamin studies and normal serum immunoglobulins with IFE. He likely has a low grade myelodysplastic syndrome. I never did a bone marrow biopsy since hemoglobins have been stable at 12 g.  Most active problem at this point relates to his heart and his  type 2 diabetes. He has known single-vessel coronary artery disease. Since his visit with me last year, he had a coronary stent placed he believes in March of 2012. He still gets a rare episode of anginal chest pressure associated with dyspnea lasting for a few minutes occurring about every other week and relieved by nitroglycerin. He is on both aspirin and brillinta antiplatelet agent.  He reports no abdominal pain, cramping, change in bowel habits, hematochezia, or melena.  Medications: reviewed  Allergies:  Allergies  Allergen Reactions  . Metformin And Related     Review of Systems: Constitutional:   No constitutional symptoms. He was losing weight but has gained back 7 pounds. Appetite is good at this time. Respiratory: See above Cardiovascular: See above  Gastrointestinal: See above Genito-Urinary: No urinary tract symptoms  Musculoskeletal: No muscle or bone pain Neurologic: No headache or change in Skin: Rash or  ecchymosis Remaining ROS negative.  Physical Exam: Blood pressure 144/57, pulse 43, temperature 97.4 F (36.3 C), height 5\' 9"  (1.753 m), weight 170 lb 3.2 oz (77.202 kg). Wt Readings from Last 3 Encounters:  06/25/11 170 lb 3.2 oz (77.202 kg)  03/05/11 169 lb (76.658 kg)  12/10/10 170 lb 3.2 oz (77.202 kg)     General appearance: Thin but adequately nourished Caucasian man HENNT: Oropharynx normal Lymph nodes: No cervical supraclavicular or axillary adenopathy Breasts: Lungs: Clear to auscultation resonant to percussion Heart: Distant cardiac sounds regular rhythm no obvious murmur Abdomen: Soft nontender no mass no organomegaly Extremities: No edema no calf tenderness Vascular: No cyanosis Neurologic: Dental status intact cranial nerves intact motor strength 5 over 5 reflexes 1+ symmetric Skin: No rash or ecchymosis  Lab Results: Lab Results  Component Value Date   WBC 5.4 06/25/2011   HGB 12.3* 06/25/2011   HCT 36.3* 06/25/2011   MCV 100.7* 06/25/2011   PLT 160 06/25/2011     Chemistry      Component Value Date/Time   NA 136 06/25/2011 1346   K 4.3 06/25/2011 1346   CL 103 06/25/2011 1346   CO2 24 06/25/2011 1346   BUN 21 06/25/2011 1346   CREATININE 1.15 06/25/2011 1346   CREATININE 1.44 07/28/2010 1745   CREATININE 1.54* 06/22/2010 1053   CREATININE 1.54* 06/22/2010 1053   CREATININE 1.54* 06/22/2010 1053      Component Value Date/Time   CALCIUM 9.0 06/25/2011 1346   ALKPHOS 44 06/25/2011 1346   AST 20 06/25/2011 1346   ALT 17 06/25/2011 1346   BILITOT 0.5 06/25/2011 1346  Impression and Plan: #1. Localized recurrence of colon cancer initially diagnosed in 1992, recurrence in July 1993, treated as outlined above. Likely cured at this time.  #2. Chronic stable macrocytic anemia hemoglobin 12 g.  #3. Coronary artery disease now status post single-vessel coronary stent  #4. Type 2 diabetes on oral agent. Sugar running high today at 299. Patient was called. We will  forward copy of lab to his primary care.  #5. Mild chronic renal insufficiency current creatinine 1.5.  #6. Essential hypertension.  #7. Hyperlipidemia.   CC:. Dr. Kirby Funk; Dr. Kingsley Plan; Dr. Dorena Cookey;   [name] Feinstein, MD 2/15/20135:39 PM

## 2011-06-25 NOTE — Telephone Encounter (Signed)
Pt notified of glucose of 299 & encouraged to watch diet & check his blood sugars at home.  This will be routed to Dr. Valentina Lucks.

## 2011-06-25 NOTE — Telephone Encounter (Signed)
Message copied by Sabino Snipes on Fri Jun 25, 2011  6:14 PM ------      Message from: Levert Feinstein      Created: Fri Jun 25, 2011  4:40 PM       Please call pt: blood sugar running high @ 299  Route result to Dr Kirby Funk

## 2011-07-07 ENCOUNTER — Ambulatory Visit (INDEPENDENT_AMBULATORY_CARE_PROVIDER_SITE_OTHER): Payer: Medicare Other | Admitting: Cardiovascular Disease

## 2011-07-07 ENCOUNTER — Encounter: Payer: Self-pay | Admitting: Cardiovascular Disease

## 2011-07-07 VITALS — BP 135/55 | HR 51 | Ht 69.0 in | Wt 169.8 lb

## 2011-07-07 DIAGNOSIS — I2510 Atherosclerotic heart disease of native coronary artery without angina pectoris: Secondary | ICD-10-CM

## 2011-07-07 DIAGNOSIS — R079 Chest pain, unspecified: Secondary | ICD-10-CM

## 2011-07-07 DIAGNOSIS — E785 Hyperlipidemia, unspecified: Secondary | ICD-10-CM

## 2011-07-07 MED ORDER — ISOSORBIDE MONONITRATE ER 30 MG PO TB24: 30.0000 mg | ORAL_TABLET | Freq: Every day | ORAL | Status: DC

## 2011-07-07 NOTE — Patient Instructions (Signed)
Your physician has requested that you have a lexiscan myoview.  Please follow instruction sheet, as given.  Your physician recommends that you schedule a follow-up appointment in: 3 months   Your physician recommends that you return for a FASTING lipid profile: 3 months   Your physician has recommended you make the following change in your medication:   START IMDUR/ ISOSORBIDE 30 MG A DAY FOR CHEST PAIN PREVENTION

## 2011-07-07 NOTE — Assessment & Plan Note (Signed)
We'll see him again 3 months for an office visit and lipids.

## 2011-07-07 NOTE — Assessment & Plan Note (Signed)
[name] is status post PTCA and stenting in the past. He is in today with exertional dyspnea and chest tightness. These symptoms didn't resolve with nitroglycerin.  Will start him on isosorbide mononitrate 30 mg a day. We'll bring him back for a Lexiscan Myoview study. I'll see him in 3 months. I'll see him sooner if needed.

## 2011-07-07 NOTE — Progress Notes (Signed)
[name] Date of Birth  May 27, 1923 [facility] [address]    [address] [place], [place]  [address] [phone]  Fax  [phone]  Problem List: 1. CAD - s/p stenting July 2012. 2. Hypertension 3. Hyperlipidemia 4. Diabetes Mellitus   History of Present Illness:  [name] has continued to have some episodes of chest discomfort. There have been several occasions when he thought about calling EMS. He typically takes a nitroglycerin and aspirin and this seems to take care of the pain.  He has shortness of breath and chest tightness with walking. [name] not able to walk nearly as far as he could last year  Current Outpatient Prescriptions on File Prior to Visit  Medication Sig Dispense Refill  . aspirin 81 MG tablet Take 81 mg by mouth daily.        [name] atorvastatin (LIPITOR) 40 MG tablet Take 1 tablet (40 mg total) by mouth daily.  30 tablet  11  . Cholecalciferol (VITAMIN D-3 PO) Take by mouth daily.        [name] glimepiride (AMARYL) 4 MG tablet Take 4 mg by mouth daily before breakfast.        . linagliptin (TRADJENTA) 5 MG TABS tablet Take 5 mg by mouth daily.        . Multiple Vitamin (MULTIVITAMIN) tablet Take 1 tablet by mouth daily.        . Multiple Vitamins-Minerals (PRESERVISION/LUTEIN) CAPS Take by mouth daily.    0  . Polyethyl Glycol-Propyl Glycol (SYSTANE OP) Apply to eye 4 (four) times daily.        . Ticagrelor (BRILINTA) 90 MG TABS tablet Take 1 tablet (90 mg total) by mouth 2 (two) times daily.  60 tablet  11  . TOPROL XL 50 MG 24 hr tablet TAKE ONE TABLET BY MOUTH EVERY DAY  90 each  3    Allergies  Allergen Reactions  . Metformin And Related     Past Medical History  Diagnosis Date  . Coronary artery disease     s/p PCI to LAD 08/05/10  . Hypertension   . Diabetes mellitus   . Hyperlipidemia   . Barrett esophagus   . Diabetes mellitus   . Gallstone pancreatitis 2011  . Nephrolithiasis   . SBO (small bowel obstruction) 2005  . Colon cancer   .  Macrocytic anemia 06/25/2011    Past Surgical History  Procedure Date  . Cholecystectomy 2011  . Right colectomy   . Coronary angioplasty with stent placement 08/05/10    DES to the LAD  . Cardiac catheterization 08/03/10  . Cardiac catheterization 08/09/10    Stent patent    History  Smoking status  . Former Smoker  . Quit date: 05/10/1994  Smokeless tobacco  . Not on file    History  Alcohol Use No    Family History  Problem Relation Age of Onset  . Cancer Mother   . Ulcers Father     Reviw of Systems:  Reviewed in the HPI.  All other systems are negative.  Physical Exam: BP 174/74  Pulse 51  Ht 5\' 9"  (1.753 m)  Wt 169 lb 12.8 oz (77.021 kg)  BMI 25.08 kg/m2 The patient is alert and oriented x 3.  The mood and affect are normal.   Skin: warm and dry.  Color is normal.    HEENT:   His pupils carotids. He is no JVD.  Lungs: His lungs are clear to auscultation.   Heart:  Heart regular rate S1-S2.    Abdomen: His abdominal exam is benign.   Extremities:  Extremities has no clubbing cyanosis or edema.  Neuro:  Nonfocal     ECG:  Assessment / Plan:

## 2011-07-16 ENCOUNTER — Inpatient Hospital Stay (HOSPITAL_COMMUNITY)
Admission: EM | Admit: 2011-07-16 | Discharge: 2011-07-22 | DRG: 308 | Disposition: A | Discharge status: Home / Self Care | Payer: Medicare Other | Attending: Internal Medicine | Admitting: Internal Medicine

## 2011-07-16 ENCOUNTER — Encounter (HOSPITAL_COMMUNITY): Payer: Self-pay | Admitting: *Deleted

## 2011-07-16 ENCOUNTER — Other Ambulatory Visit: Payer: Self-pay

## 2011-07-16 ENCOUNTER — Emergency Department (HOSPITAL_COMMUNITY): Payer: Medicare Other

## 2011-07-16 DIAGNOSIS — R0602 Shortness of breath: Secondary | ICD-10-CM

## 2011-07-16 DIAGNOSIS — I509 Heart failure, unspecified: Secondary | ICD-10-CM | POA: Diagnosis present

## 2011-07-16 DIAGNOSIS — E119 Type 2 diabetes mellitus without complications: Secondary | ICD-10-CM | POA: Diagnosis present

## 2011-07-16 DIAGNOSIS — I359 Nonrheumatic aortic valve disorder, unspecified: Secondary | ICD-10-CM | POA: Diagnosis present

## 2011-07-16 DIAGNOSIS — J4489 Other specified chronic obstructive pulmonary disease: Secondary | ICD-10-CM | POA: Diagnosis present

## 2011-07-16 DIAGNOSIS — Z7901 Long term (current) use of anticoagulants: Secondary | ICD-10-CM

## 2011-07-16 DIAGNOSIS — I4891 Unspecified atrial fibrillation: Principal | ICD-10-CM | POA: Diagnosis present

## 2011-07-16 DIAGNOSIS — E785 Hyperlipidemia, unspecified: Secondary | ICD-10-CM | POA: Insufficient documentation

## 2011-07-16 DIAGNOSIS — Z23 Encounter for immunization: Secondary | ICD-10-CM

## 2011-07-16 DIAGNOSIS — IMO0002 Reserved for concepts with insufficient information to code with codable children: Secondary | ICD-10-CM

## 2011-07-16 DIAGNOSIS — J449 Chronic obstructive pulmonary disease, unspecified: Secondary | ICD-10-CM | POA: Diagnosis present

## 2011-07-16 DIAGNOSIS — Z87442 Personal history of urinary calculi: Secondary | ICD-10-CM

## 2011-07-16 DIAGNOSIS — Z9861 Coronary angioplasty status: Secondary | ICD-10-CM

## 2011-07-16 DIAGNOSIS — Z87891 Personal history of nicotine dependence: Secondary | ICD-10-CM

## 2011-07-16 DIAGNOSIS — D469 Myelodysplastic syndrome, unspecified: Secondary | ICD-10-CM | POA: Diagnosis present

## 2011-07-16 DIAGNOSIS — I428 Other cardiomyopathies: Secondary | ICD-10-CM | POA: Diagnosis present

## 2011-07-16 DIAGNOSIS — I480 Paroxysmal atrial fibrillation: Secondary | ICD-10-CM

## 2011-07-16 DIAGNOSIS — Z79899 Other long term (current) drug therapy: Secondary | ICD-10-CM

## 2011-07-16 DIAGNOSIS — Z9981 Dependence on supplemental oxygen: Secondary | ICD-10-CM

## 2011-07-16 DIAGNOSIS — Z888 Allergy status to other drugs, medicaments and biological substances status: Secondary | ICD-10-CM

## 2011-07-16 DIAGNOSIS — Z7982 Long term (current) use of aspirin: Secondary | ICD-10-CM

## 2011-07-16 DIAGNOSIS — Z85038 Personal history of other malignant neoplasm of large intestine: Secondary | ICD-10-CM

## 2011-07-16 DIAGNOSIS — I429 Cardiomyopathy, unspecified: Secondary | ICD-10-CM

## 2011-07-16 DIAGNOSIS — I4892 Unspecified atrial flutter: Secondary | ICD-10-CM | POA: Diagnosis present

## 2011-07-16 DIAGNOSIS — R0902 Hypoxemia: Secondary | ICD-10-CM | POA: Diagnosis present

## 2011-07-16 DIAGNOSIS — I10 Essential (primary) hypertension: Secondary | ICD-10-CM | POA: Insufficient documentation

## 2011-07-16 DIAGNOSIS — D649 Anemia, unspecified: Secondary | ICD-10-CM | POA: Diagnosis present

## 2011-07-16 DIAGNOSIS — I2510 Atherosclerotic heart disease of native coronary artery without angina pectoris: Secondary | ICD-10-CM | POA: Insufficient documentation

## 2011-07-16 DIAGNOSIS — I5023 Acute on chronic systolic (congestive) heart failure: Secondary | ICD-10-CM | POA: Diagnosis present

## 2011-07-16 DIAGNOSIS — I5043 Acute on chronic combined systolic (congestive) and diastolic (congestive) heart failure: Secondary | ICD-10-CM

## 2011-07-16 LAB — BASIC METABOLIC PANEL
BUN: 29 mg/dL — ABNORMAL HIGH (ref 6–23)
Chloride: 106 mEq/L (ref 96–112)
Creatinine, Ser: 1.14 mg/dL (ref 0.50–1.35)
GFR calc Af Amer: 65 mL/min — ABNORMAL LOW (ref >90)

## 2011-07-16 LAB — CBC
HCT: 37.1 % — ABNORMAL LOW (ref 39.0–52.0)
Hemoglobin: 12.9 g/dL — ABNORMAL LOW (ref 13.0–17.0)
MCH: 34.6 pg — ABNORMAL HIGH (ref 26.0–34.0)
MCHC: 34.8 g/dL (ref 30.0–36.0)
MCV: 99.5 fL (ref 78.0–100.0)

## 2011-07-16 LAB — DIFFERENTIAL
Basophils Relative: 0 % (ref 0–1)
Eosinophils Relative: 1 % (ref 0–5)
Monocytes Absolute: 0.5 10*3/uL (ref 0.1–1.0)
Monocytes Relative: 5 % (ref 3–12)
Neutro Abs: 8.8 10*3/uL — ABNORMAL HIGH (ref 1.7–7.7)

## 2011-07-16 LAB — CARDIAC PANEL(CRET KIN+CKTOT+MB+TROPI): Relative Index: 2.5 (ref 0.0–2.5)

## 2011-07-16 MED ORDER — ONDANSETRON HCL 4 MG/2ML IJ SOLN
4.0000 mg | Freq: Once | INTRAMUSCULAR | Status: AC
  Administered 2011-07-16: 4 mg via INTRAVENOUS

## 2011-07-16 MED ORDER — ONDANSETRON HCL 4 MG/2ML IJ SOLN
INTRAMUSCULAR | Status: AC
  Administered 2011-07-16: 4 mg via INTRAVENOUS
  Filled 2011-07-16: qty 2

## 2011-07-16 NOTE — ED Notes (Signed)
Pt from home.  Pt has been having SOB, chest tightness x 1 hour.  2SL nitro, 325 asa at home PTA with minimal relief.  Had similar episodes for the past 6-8 months.  Has stress test in may.  Had 1 stent placed in March 2012.  Imdur started for pain, this time the pain was not relieved and he was dizzy after taking imdur and nitro.  Vitals stable 140 systolic, afib on monitor-hx of.  18ga LAC.

## 2011-07-16 NOTE — ED Notes (Signed)
See triage note.  Pt reports 'fullness and discomfort' in his chest.  Denies pain.  Pt reports that it is much less than when he called 911.  Pt reporting nausea.  Pt nontender on palpation, no pain with deep inspiration. Vitals stable.  Pt resting, no distress noted.

## 2011-07-16 NOTE — ED Notes (Signed)
Family at bedside. 

## 2011-07-16 NOTE — ED Notes (Signed)
PT placed on monitor. Repositioned. Given emesis bag for nausea

## 2011-07-17 ENCOUNTER — Encounter (HOSPITAL_COMMUNITY): Payer: Self-pay | Admitting: *Deleted

## 2011-07-17 DIAGNOSIS — R079 Chest pain, unspecified: Secondary | ICD-10-CM

## 2011-07-17 DIAGNOSIS — R0602 Shortness of breath: Secondary | ICD-10-CM | POA: Insufficient documentation

## 2011-07-17 DIAGNOSIS — R0789 Other chest pain: Secondary | ICD-10-CM | POA: Insufficient documentation

## 2011-07-17 LAB — BASIC METABOLIC PANEL
CO2: 22 mEq/L (ref 19–32)
Calcium: 8.9 mg/dL (ref 8.4–10.5)
Creatinine, Ser: 1.15 mg/dL (ref 0.50–1.35)
GFR calc Af Amer: 65 mL/min — ABNORMAL LOW (ref >90)
GFR calc non Af Amer: 56 mL/min — ABNORMAL LOW (ref >90)
Sodium: 141 mEq/L (ref 135–145)

## 2011-07-17 LAB — CBC
MCH: 34.1 pg — ABNORMAL HIGH (ref 26.0–34.0)
MCV: 100 fL (ref 78.0–100.0)
MCV: 100.8 fL — ABNORMAL HIGH (ref 78.0–100.0)
Platelets: 158 10*3/uL (ref 150–400)
Platelets: 147 10*3/uL — ABNORMAL LOW (ref 150–400)
RBC: 3.67 MIL/uL — ABNORMAL LOW (ref 4.22–5.81)
RDW: 13.9 % (ref 11.5–15.5)
RDW: 14 % (ref 11.5–15.5)
WBC: 8.6 10*3/uL (ref 4.0–10.5)

## 2011-07-17 LAB — CARDIAC PANEL(CRET KIN+CKTOT+MB+TROPI)
CK, MB: 2.5 ng/mL (ref 0.3–4.0)
CK, MB: 3.2 ng/mL (ref 0.3–4.0)
CK, MB: 3 ng/mL (ref 0.3–4.0)
Total CK: 112 U/L (ref 7–232)
Troponin I: <0.3 ng/mL (ref <0.30)
Troponin I: <0.3 ng/mL (ref <0.30)

## 2011-07-17 LAB — GLUCOSE, CAPILLARY: Glucose-Capillary: 137 mg/dL — ABNORMAL HIGH (ref 70–99)

## 2011-07-17 LAB — CREATININE, SERUM: Creatinine, Ser: 1.06 mg/dL (ref 0.50–1.35)

## 2011-07-17 LAB — PRO B NATRIURETIC PEPTIDE: Pro B Natriuretic peptide (BNP): 360.6 pg/mL (ref 0–450)

## 2011-07-17 LAB — D-DIMER, QUANTITATIVE: D-Dimer, Quant: 0.67 ug/mL-FEU — ABNORMAL HIGH (ref 0.00–0.48)

## 2011-07-17 MED ORDER — ACETAMINOPHEN 325 MG PO TABS: 650.0000 mg | ORAL_TABLET | Freq: Four times a day (QID) | ORAL | Status: DC | PRN

## 2011-07-17 MED ORDER — HYDROMORPHONE HCL PF 1 MG/ML IJ SOLN: 0.5000 mg | INTRAMUSCULAR | Status: DC | PRN

## 2011-07-17 MED ORDER — DEXTROSE 5 % IV SOLN
500.0000 mg | Freq: Once | INTRAVENOUS | Status: AC
  Administered 2011-07-17: 500 mg via INTRAVENOUS

## 2011-07-17 MED ORDER — NITROGLYCERIN 0.2 MG/HR TD PT24: 0.2000 mg | MEDICATED_PATCH | Freq: Every day | TRANSDERMAL | Status: DC

## 2011-07-17 MED ORDER — ONDANSETRON HCL 4 MG PO TABS: 4.0000 mg | ORAL_TABLET | Freq: Four times a day (QID) | ORAL | Status: DC | PRN

## 2011-07-17 MED ORDER — ACETAMINOPHEN 325 MG PO TABS: 650.0000 mg | ORAL_TABLET | ORAL | Status: DC | PRN

## 2011-07-17 MED ORDER — ACETAMINOPHEN 650 MG RE SUPP: 650.0000 mg | Freq: Four times a day (QID) | RECTAL | Status: DC | PRN

## 2011-07-17 MED ORDER — SODIUM CHLORIDE 0.9 % IJ SOLN
3.0000 mL | Freq: Two times a day (BID) | INTRAMUSCULAR | Status: DC
  Administered 2011-07-18 – 2011-07-19 (×4): 3 mL via INTRAVENOUS

## 2011-07-17 MED ORDER — POTASSIUM CHLORIDE CRYS ER 10 MEQ PO TBCR
10.0000 meq | EXTENDED_RELEASE_TABLET | Freq: Two times a day (BID) | ORAL | Status: AC
  Administered 2011-07-17 – 2011-07-18 (×4): 10 meq via ORAL
  Filled 2011-07-17 (×4): qty 1

## 2011-07-17 MED ORDER — ONDANSETRON HCL 4 MG/2ML IJ SOLN: 4.0000 mg | Freq: Four times a day (QID) | INTRAMUSCULAR | Status: DC | PRN

## 2011-07-17 MED ORDER — DEXTROSE 5 % IV SOLN
INTRAVENOUS | Status: AC
  Filled 2011-07-17: qty 500

## 2011-07-17 MED ORDER — FUROSEMIDE 10 MG/ML IJ SOLN
20.0000 mg | Freq: Two times a day (BID) | INTRAMUSCULAR | Status: DC
  Administered 2011-07-17 (×2): 20 mg via INTRAVENOUS
  Filled 2011-07-17 (×4): qty 2

## 2011-07-17 MED ORDER — SODIUM CHLORIDE 0.9 % IV SOLN: 250.0000 mL | INTRAVENOUS | Status: DC | PRN

## 2011-07-17 MED ORDER — ONDANSETRON HCL 4 MG/2ML IJ SOLN: 4.0000 mg | INTRAMUSCULAR | Status: DC | PRN

## 2011-07-17 MED ORDER — OXYCODONE HCL 5 MG PO TABS: 5.0000 mg | ORAL_TABLET | ORAL | Status: DC | PRN

## 2011-07-17 MED ORDER — CEFTRIAXONE SODIUM 1 G IJ SOLR
1.0000 g | INTRAMUSCULAR | Status: DC
  Administered 2011-07-17 – 2011-07-18 (×2): 1 g via INTRAVENOUS
  Filled 2011-07-17 (×2): qty 10

## 2011-07-17 MED ORDER — SODIUM CHLORIDE 0.9 % IV SOLN
INTRAVENOUS | Status: DC
  Administered 2011-07-17: 04:00:00 via INTRAVENOUS

## 2011-07-17 MED ORDER — SODIUM CHLORIDE 0.9 % IJ SOLN: 3.0000 mL | INTRAMUSCULAR | Status: DC | PRN

## 2011-07-17 MED ORDER — ENOXAPARIN SODIUM 40 MG/0.4ML ~~LOC~~ SOLN: 40.0000 mg | SUBCUTANEOUS | Status: DC

## 2011-07-17 MED ORDER — ZOLPIDEM TARTRATE 5 MG PO TABS
5.0000 mg | ORAL_TABLET | Freq: Every evening | ORAL | Status: DC | PRN
  Administered 2011-07-21: 5 mg via ORAL
  Filled 2011-07-17: qty 1

## 2011-07-17 MED ORDER — LISINOPRIL 5 MG PO TABS
5.0000 mg | ORAL_TABLET | Freq: Every day | ORAL | Status: DC
  Administered 2011-07-17 – 2011-07-22 (×6): 5 mg via ORAL
  Filled 2011-07-17 (×7): qty 1

## 2011-07-17 MED ORDER — ALUM & MAG HYDROXIDE-SIMETH 200-200-20 MG/5ML PO SUSP: 30.0000 mL | Freq: Four times a day (QID) | ORAL | Status: DC | PRN

## 2011-07-17 MED ORDER — DEXTROSE 5 % IV SOLN
1.0000 g | Freq: Once | INTRAVENOUS | Status: AC
  Administered 2011-07-17: 1 g via INTRAVENOUS
  Filled 2011-07-17: qty 10

## 2011-07-17 MED ORDER — SODIUM CHLORIDE 0.9 % IJ SOLN
3.0000 mL | Freq: Two times a day (BID) | INTRAMUSCULAR | Status: DC
  Administered 2011-07-17: 3 mL via INTRAVENOUS
  Administered 2011-07-18: 21:00:00 via INTRAVENOUS
  Administered 2011-07-19: 3 mL via INTRAVENOUS

## 2011-07-17 MED ORDER — DEXTROSE 5 % IV SOLN: 1.0000 g | INTRAVENOUS | Status: DC

## 2011-07-17 MED ORDER — DEXTROSE 5 % IV SOLN
500.0000 mg | INTRAVENOUS | Status: DC
  Administered 2011-07-18: 500 mg via INTRAVENOUS
  Filled 2011-07-17 (×2): qty 500

## 2011-07-17 MED ORDER — ONDANSETRON HCL 4 MG/2ML IJ SOLN
4.0000 mg | Freq: Four times a day (QID) | INTRAMUSCULAR | Status: DC | PRN
  Administered 2011-07-17: 4 mg via INTRAVENOUS
  Filled 2011-07-17: qty 2

## 2011-07-17 MED ORDER — ONDANSETRON HCL 4 MG PO TABS
8.0000 mg | ORAL_TABLET | ORAL | Status: DC | PRN
  Administered 2011-07-17: 8 mg via ORAL
  Filled 2011-07-17: qty 2

## 2011-07-17 MED ORDER — ENOXAPARIN SODIUM 40 MG/0.4ML ~~LOC~~ SOLN
40.0000 mg | SUBCUTANEOUS | Status: DC
  Administered 2011-07-17 – 2011-07-18 (×2): 40 mg via SUBCUTANEOUS
  Filled 2011-07-17 (×4): qty 0.4

## 2011-07-17 NOTE — Progress Notes (Signed)
  Echocardiogram 2D Echocardiogram has been performed.  [name], Real Cons 07/17/2011, 5:22 PM

## 2011-07-17 NOTE — ED Provider Notes (Signed)
History     CSN: [id number]  Arrival date & time 07/16/11  2219   First MD Initiated Contact with Patient 07/16/11 2255      Chief Complaint  Patient presents with  . Chest Pain    (Consider location/radiation/quality/duration/timing/severity/associated sxs/prior treatment) Patient is a 78 y.o. male presenting with chest pain. The history is provided by the patient.  Chest Pain The chest pain began 1 - 2 hours ago. Duration of episode(s) is 2 hours. Chest pain occurs constantly. The chest pain is resolved. Associated with: Nothing. At its most intense, the pain is at 5/10. The pain is currently at 0/10. The severity of the pain is moderate. The quality of the pain is described as tightness. The pain does not radiate. Exacerbated by: Nothing. Primary symptoms include shortness of breath, nausea and vomiting. Pertinent negatives for primary symptoms include no fever, no syncope, no cough, no wheezing, no abdominal pain, no dizziness and no altered mental status.  Pertinent negatives for associated symptoms include no diaphoresis. He tried nitroglycerin for the symptoms. Risk factors: Known heart disease.  Procedure history is positive for cardiac catheterization.    patient has had intermittent and persistent shortness of breath with chest tightness for the last 6 months. He is scheduled for stress test next week with his cardiologist. Modena Jansky he developed the same symptoms of shortness of breath and chest tightness and took nitroglycerin x2 with no relief. EMS was called and by the time he arrives to emergency department the symptoms are improving. Patient since has developed nausea and vomiting. He still has some shortness of breath. No leg pain or swelling or history of PE. No history of CHF. No cough or flulike symptoms.  Past Medical History  Diagnosis Date  . Coronary artery disease     s/p PCI to LAD 08/05/10  . Hypertension   . Diabetes mellitus   . Hyperlipidemia   . Barrett  esophagus   . Diabetes mellitus   . Gallstone pancreatitis 2011  . Nephrolithiasis   . SBO (small bowel obstruction) 2005  . Colon cancer   . Macrocytic anemia 06/25/2011    Past Surgical History  Procedure Date  . Cholecystectomy 2011  . Right colectomy   . Coronary angioplasty with stent placement 08/05/10    DES to the LAD  . Cardiac catheterization 08/03/10  . Cardiac catheterization 08/09/10    Stent patent    Family History  Problem Relation Age of Onset  . Cancer Mother   . Ulcers Father     History  Substance Use Topics  . Smoking status: Former Smoker    Quit date: 05/10/1994  . Smokeless tobacco: Not on file  . Alcohol Use: No      Review of Systems  Constitutional: Negative for fever, chills and diaphoresis.  HENT: Negative for neck pain and neck stiffness.   Eyes: Negative for pain.  Respiratory: Positive for shortness of breath. Negative for cough and wheezing.   Cardiovascular: Positive for chest pain. Negative for syncope.  Gastrointestinal: Positive for nausea and vomiting. Negative for abdominal pain.  Genitourinary: Negative for dysuria.  Musculoskeletal: Negative for back pain.  Skin: Negative for rash.  Neurological: Negative for dizziness and headaches.  Psychiatric/Behavioral: Negative for altered mental status.  All other systems reviewed and are negative.    Allergies  Metformin and related  Home Medications   Current Outpatient Rx  Name Route Sig Dispense Refill  . ASPIRIN EC 81 MG PO TBEC Oral Take 81  mg by mouth daily.    . ATORVASTATIN CALCIUM 40 MG PO TABS Oral Take 40 mg by mouth daily.    [name] VITAMIN D-3 PO Oral Take 1 tablet by mouth daily.     [name] GLIMEPIRIDE 4 MG PO TABS Oral Take 4 mg by mouth daily before breakfast.      . ISOSORBIDE MONONITRATE ER 30 MG PO TB24 Oral Take 30 mg by mouth daily.    [name] LINAGLIPTIN 5 MG PO TABS Oral Take 5 mg by mouth daily.      [name] MELATONIN 10 MG PO CAPS Oral Take 10 mg by mouth daily. For Sleep     . METOPROLOL SUCCINATE ER 50 MG PO TB24 Oral Take 50 mg by mouth daily. Take with or immediately following a meal.    . ONE-DAILY MULTI VITAMINS PO TABS Oral Take 1 tablet by mouth daily.      [name] PRESERVISION/LUTEIN PO CAPS Oral Take 1 capsule by mouth daily.   0  . NITROSTAT 0.4 MG SL SUBL Oral Take 1 tablet by mouth every 5 (five) minutes x 3 doses as needed. For chest pain.    [name] OMEPRAZOLE 20 MG PO CPDR Oral Take 20 mg by mouth daily.    [name] PANTOPRAZOLE SODIUM 40 MG PO TBEC Oral Take 1 tablet by mouth Daily.    Frazier Butt OP Ophthalmic Apply to eye 4 (four) times daily.      [name] TICAGRELOR 90 MG PO TABS Oral Take 90 mg by mouth 2 (two) times daily.      BP 140/60  Pulse 96  Temp(Src) 98.2 F (36.8 C) (Oral)  Resp 18  SpO2 87%  Physical Exam  Constitutional: He is oriented to person, place, and time. He appears well-developed and well-nourished.  HENT:  Head: Normocephalic and atraumatic.  Eyes: Conjunctivae and EOM are normal. Pupils are equal, round, and reactive to light.  Neck: Trachea normal. Neck supple. No thyromegaly present.  Cardiovascular: Normal rate, regular rhythm, S1 normal, S2 normal and normal pulses.     No systolic murmur is present   No diastolic murmur is present  Pulses:      Radial pulses are 2+ on the right side, and 2+ on the left side.  Pulmonary/Chest: Effort normal and breath sounds normal. He has no wheezes. He has no rhonchi. He has no rales. He exhibits no tenderness.  Abdominal: Soft. Normal appearance and bowel sounds are normal. There is no tenderness. There is no CVA tenderness and negative Murphy's sign.  Musculoskeletal:       BLE:s Calves nontender, no cords or erythema, negative Homans sign  Neurological: He is alert and oriented to person, place, and time. He has normal strength. No cranial nerve deficit or sensory deficit. GCS eye subscore is 4. GCS verbal subscore is 5. GCS motor subscore is 6.  Skin: Skin is warm and dry. No rash noted. He  is not diaphoretic.  Psychiatric: His speech is normal.       Cooperative and appropriate    ED Course  Procedures (including critical care time)  Labs Reviewed  CBC - Abnormal; Notable for the following:    RBC 3.73 (*)    Hemoglobin 12.9 (*)    HCT 37.1 (*)    MCH 34.6 (*)    All other components within normal limits  DIFFERENTIAL - Abnormal; Notable for the following:    Neutrophils Relative 91 (*)    Neutro Abs 8.8 (*)    Lymphocytes  Relative 3 (*)    Lymphs Abs 0.3 (*)    All other components within normal limits  BASIC METABOLIC PANEL - Abnormal; Notable for the following:    Glucose, Bld 145 (*)    BUN 29 (*)    GFR calc non Af Amer 56 (*)    GFR calc Af Amer 65 (*)    All other components within normal limits  D-DIMER, QUANTITATIVE - Abnormal; Notable for the following:    D-Dimer, Quant 0.67 (*)    All other components within normal limits  CARDIAC PANEL(CRET KIN+CKTOT+MB+TROPI)  PRO B NATRIURETIC PEPTIDE  BASIC METABOLIC PANEL  CBC  CARDIAC PANEL(CRET KIN+CKTOT+MB+TROPI)  CARDIAC PANEL(CRET KIN+CKTOT+MB+TROPI)  CARDIAC PANEL(CRET KIN+CKTOT+MB+TROPI)  CBC  CREATININE, SERUM  TSH   Dg Chest 2 View  07/16/2011  *RADIOLOGY REPORT*  Clinical Data: Left chest pressure.  CHEST - 2 VIEW  Comparison: 02/05/2011.  Findings: The cardiac silhouette remains borderline enlarged. Mildly progressive diffuse prominence of the interstitial markings. No pleural fluid.  Thoracic spine degenerative changes. Cholecystectomy clips.  IMPRESSION: Progressive interstitial lung disease.  Original Report Authenticated By: Darrol Angel, M.D.    Date: 07/17/2011  Rate: 92  Rhythm: normal sinus rhythm  QRS Axis: normal  Intervals: normal  ST/T Wave abnormalities: nonspecific ST/T changes  Conduction Disutrbances:right bundle branch block  Narrative Interpretation:   Old EKG Reviewed: unchanged  Hypoxia improved with 2 L oxygen nasal cannula.  Aspirin at home prior to arrival. No  chest pain in the ED. Nausea improved somewhat with IV Zofran. Did have active vomiting in the emergency department without return of chest pain.  1. Chest pain    Case discussed as above with [facility] cardiologist on call, who agrees to consultation given consolation of symptoms with chest x-ray findings in addition to hypoxia, the medical admission the appropriate this time. Case discussed with Dr. Lovell Sheehan on call for hospitalist who agrees to admit. With discussion, decided to initiate antibiotics for possible early pneumonia as well as add on a d-dimer to the workup.   MDM   Chest pain, shortness of breath, nausea vomiting with presentation and workup as above. Although initial symptoms do seem to be cardiac in nature and concerning for unstable angina, chest x-ray and hypoxia concerning for pulmonary source of symptoms. If patient does develop elevated cardiac enzymes, may require cardiac catheterization and initially d-dimer was held. After discussed with hospitalist, d-dimer was sent and is elevated.  Plan cardiology consult and medical admission for further workup and evaluation of symptoms.         Sunnie Nielsen, MD 07/17/11 [phone]

## 2011-07-17 NOTE — Progress Notes (Signed)
Patient started getting nauseated and vomiting. Vomit looked like soup. See documentation for vitals, however, HR 102, BP 145/64. Nurse administered Zofran to the patient. 5 mins later patient articulated to the nurse that he felt better.Harmon Pier

## 2011-07-17 NOTE — ED Notes (Signed)
Attempt to call report x 1, RN unable.  

## 2011-07-17 NOTE — Consult Note (Signed)
Reason for Consult: Chest tightness  Referring Physician: Dr. Benjamine Mola, J  Primary Cardiologist: Kristeen Miss  [name] is an 78 y.o. male.   HPI: 78 y/o male with a PMH of HTN, Hyperlipidemia, CAD s/p LAD stent 08/05/2010 who is currently been seen in consultation with Dr. Benjamine Mola for evaluation of chest discomfort.  Patient report that he has been have intermittent chest discomfort since his stent placement last year.  His chest discomfort is described as a tightness, 4/10 in severity.  It typically occur at rest about once every 3 weeks and is typically relieved by SL NTG after about 2 to 5 minutes.  He had chest discomfort earlier today with some nausea and vomiting. In ED, his ECG did not show any acute ischemic changes, and his first set of cardiac marker was negative.  Currently he has no chest discomfort.  He was recently seen by [facility] for chest pain evaluation. Imdur 30 mg q day was added to his medication regimen with a plan to obtain a stress test (myoview) next week.   Past Medical History  Diagnosis Date  . Coronary artery disease     s/p PCI to LAD 08/05/10  . Hypertension   . Diabetes mellitus   . Hyperlipidemia   . Barrett esophagus   . Diabetes mellitus   . Gallstone pancreatitis 2011  . Nephrolithiasis   . SBO (small bowel obstruction) 2005  . Colon cancer   . Macrocytic anemia 06/25/2011    Past Surgical History  Procedure Date  . Cholecystectomy 2011  . Right colectomy   . Coronary angioplasty with stent placement 08/05/10    DES to the LAD  . Cardiac catheterization 08/03/10  . Cardiac catheterization 08/09/10    Stent patent    Family History  Problem Relation Age of Onset  . Cancer Mother   . Ulcers Father     Social History:  reports that he quit smoking about 17 years ago. He does not have any smokeless tobacco history on file. He reports that he does not drink alcohol or use illicit drugs.  Allergies:  Allergies  Allergen Reactions  . Metformin  And Related Nausea And Vomiting    Medications:   1. ASA 81 mg p.o. q daily 2. Lipitor 80 mg p.o. qhs 3. Ticagrelol 90 mg p.o. Bid 4. Toprol XL 50 mg p.o. q daily 5.  Imdur 30 mg p.o. q daily  Results for orders placed during the hospital encounter of 07/16/11 (from the past 48 hour(s))  CBC     Status: Abnormal   Collection Time   07/16/11 10:33 PM      Component Value Range Comment   WBC 9.7  4.0 - 10.5 (K/uL)    RBC 3.73 (*) 4.22 - 5.81 (MIL/uL)    Hemoglobin 12.9 (*) 13.0 - 17.0 (g/dL)    HCT 16.1 (*) 09.6 - 52.0 (%)    MCV 99.5  78.0 - 100.0 (fL)    MCH 34.6 (*) 26.0 - 34.0 (pg)    MCHC 34.8  30.0 - 36.0 (g/dL)    RDW 04.5  40.9 - 81.1 (%)    Platelets 170  150 - 400 (K/uL)   DIFFERENTIAL     Status: Abnormal   Collection Time   07/16/11 10:33 PM      Component Value Range Comment   Neutrophils Relative 91 (*) 43 - 77 (%)    Neutro Abs 8.8 (*) 1.7 - 7.7 (K/uL)    Lymphocytes Relative  3 (*) 12 - 46 (%)    Lymphs Abs 0.3 (*) 0.7 - 4.0 (K/uL)    Monocytes Relative 5  3 - 12 (%)    Monocytes Absolute 0.5  0.1 - 1.0 (K/uL)    Eosinophils Relative 1  0 - 5 (%)    Eosinophils Absolute 0.1  0.0 - 0.7 (K/uL)    Basophils Relative 0  0 - 1 (%)    Basophils Absolute 0.0  0.0 - 0.1 (K/uL)   BASIC METABOLIC PANEL     Status: Abnormal   Collection Time   07/16/11 10:33 PM      Component Value Range Comment   Sodium 140  135 - 145 (mEq/L)    Potassium 4.2  3.5 - 5.1 (mEq/L)    Chloride 106  96 - 112 (mEq/L)    CO2 21  19 - 32 (mEq/L)    Glucose, Bld 145 (*) 70 - 99 (mg/dL)    BUN 29 (*) 6 - 23 (mg/dL)    Creatinine, Ser 4.78  0.50 - 1.35 (mg/dL)    Calcium 9.5  8.4 - 10.5 (mg/dL)    GFR calc non Af Amer 56 (*) >90 (mL/min)    GFR calc Af Amer 65 (*) >90 (mL/min)   CARDIAC PANEL(CRET KIN+CKTOT+MB+TROPI)     Status: Normal   Collection Time   07/16/11 10:33 PM      Component Value Range Comment   Total CK 102  7 - 232 (U/L)    CK, MB 2.5  0.3 - 4.0 (ng/mL)    Troponin I <0.30   <0.30 (ng/mL)    Relative Index 2.5  0.0 - 2.5    PRO B NATRIURETIC PEPTIDE     Status: Normal   Collection Time   07/16/11 11:50 PM      Component Value Range Comment   Pro B Natriuretic peptide (BNP) 360.6  0 - 450 (pg/mL)   D-DIMER, QUANTITATIVE     Status: Abnormal   Collection Time   07/17/11 12:29 AM      Component Value Range Comment   D-Dimer, Quant 0.67 (*) 0.00 - 0.48 (ug/mL-FEU)     Dg Chest 2 View  07/16/2011  *RADIOLOGY REPORT*  Clinical Data: Left chest pressure.  CHEST - 2 VIEW  Comparison: 02/05/2011.  Findings: The cardiac silhouette remains borderline enlarged. Mildly progressive diffuse prominence of the interstitial markings. No pleural fluid.  Thoracic spine degenerative changes. Cholecystectomy clips.  IMPRESSION: Progressive interstitial lung disease.  Original Report Authenticated By: Darrol Angel, M.D.    Review of Systems  Constitutional: Negative for fever, chills, weight loss, malaise/fatigue and diaphoresis.  HENT: Negative for hearing loss, ear pain, nosebleeds, tinnitus and ear discharge.   Eyes: Negative for blurred vision, double vision and photophobia.  Respiratory: Negative for cough, hemoptysis, sputum production and shortness of breath.   Cardiovascular: Positive for chest pain. Negative for palpitations, orthopnea, claudication and leg swelling.  Gastrointestinal: Positive for nausea and vomiting. Negative for heartburn, diarrhea and constipation.  Skin: Negative for itching and rash.  Neurological: Negative for dizziness, tingling, tremors, sensory change, speech change and headaches.  Endo/Heme/Allergies: Negative for environmental allergies and polydipsia. Does not bruise/bleed easily.  Psychiatric/Behavioral: Negative for depression, suicidal ideas and hallucinations.   Blood pressure 140/60, pulse 96, temperature 98.2 F (36.8 C), temperature source Oral, resp. rate 18, SpO2 87.00%. Physical Exam  Constitutional: He is oriented to person,  place, and time. He appears well-developed and well-nourished. No distress.  HENT:  Head: Normocephalic and atraumatic.  Eyes: EOM are normal. Right eye exhibits no discharge. Left eye exhibits no discharge.  Neck: Normal range of motion. Neck supple. No JVD present. No thyromegaly present.  Cardiovascular: Normal rate, regular rhythm and normal heart sounds.  Exam reveals no gallop and no friction rub.   No murmur heard. Respiratory: Effort normal and breath sounds normal. No respiratory distress. He has no wheezes. He has no rales.  GI: Soft. Bowel sounds are normal. He exhibits no distension. There is no tenderness. There is no rebound and no guarding.  Musculoskeletal: He exhibits no edema and no tenderness.  Neurological: He is alert and oriented to person, place, and time. No cranial nerve deficit.  Skin: Skin is warm. No rash noted. He is not diaphoretic. No erythema.  Psychiatric: He has a normal mood and affect.   ECG: sinus rhythm with RBBB and PAC.  When compared with prior ECG from 02/05/2011, there has been no significant interval change.  Assessment:  1.  Chest pain 2.  CAD s/p LAD stent 08/05/2010 3.  HTN 4.  Hyperlipidemia 5.  DM 6.  Colon Cancer s/p colectomy followed by chemotherapy  Plan:  I recommend restarting his cardiac medication and obtaining 3 sets of cardiac markers to see if patient rules in for MI. If he does not rule in for MI and he is not having active chest pain, it is reasonable to proceed with a stress test as previously recommended by Dr. Elease Hashimoto. If he does rule in for MI with cardiac markers, then he may proceed with a cardiac catheterization.  Starkeisha Vanwinkle E 07/17/2011, 2:19 AM

## 2011-07-17 NOTE — H&P (Addendum)
DATE OF ADMISSION:  07/17/2011  PCP:    Lillia Mountain, MD, MD   Chief Complaint:  SOB and Chest Tightness   HPI: [name] is an 78 y.o. male who present to the ED with complaints of worsening SOB and chest tightness over the past 2 days.  He reports having these symptoms for several months and has undergone a cardiac workup with his cardiologist Dr. Elease Hashimoto in the past. He reports that he is scheduled to have a nuclear stress test in the upcoming weeks.  He reports the discomfort does improve with nitroglycerin.  He also reports having a cough which has been productive of light yellow sputum, but he denies having any fever or chills or night sweats.       Past Medical History  Diagnosis Date  . Coronary artery disease     s/p PCI to LAD 08/05/10  . Hypertension   . Diabetes mellitus   . Hyperlipidemia   . Barrett esophagus   . Diabetes mellitus   . Gallstone pancreatitis 2011  . Nephrolithiasis   . SBO (small bowel obstruction) 2005  . Colon cancer   . Macrocytic anemia 06/25/2011    Past Surgical History  Procedure Date  . Cholecystectomy 2011  . Right colectomy   . Coronary angioplasty with stent placement 08/05/10    DES to the LAD  . Cardiac catheterization 08/03/10  . Cardiac catheterization 08/09/10    Stent patent    Medications:  HOME MEDS: Prior to Admission medications   Medication Sig Start Date End Date Taking? Authorizing Provider  aspirin EC 81 MG tablet Take 81 mg by mouth daily.   Yes Historical Provider, MD  atorvastatin (LIPITOR) 40 MG tablet Take 40 mg by mouth daily. 12/10/10 12/10/11 Yes Vesta Mixer, MD  Cholecalciferol (VITAMIN D-3 PO) Take 1 tablet by mouth daily.    Yes Historical Provider, MD  glimepiride (AMARYL) 4 MG tablet Take 4 mg by mouth daily before breakfast.     Yes Historical Provider, MD  isosorbide mononitrate (IMDUR) 30 MG 24 hr tablet Take 30 mg by mouth daily. 07/07/11 07/06/12 Yes Vesta Mixer, MD  linagliptin  (TRADJENTA) 5 MG TABS tablet Take 5 mg by mouth daily.     Yes Historical Provider, MD  Melatonin 10 MG CAPS Take 10 mg by mouth daily. For Sleep   Yes Historical Provider, MD  metoprolol succinate (TOPROL-XL) 50 MG 24 hr tablet Take 50 mg by mouth daily. Take with or immediately following a meal.   Yes Historical Provider, MD  Multiple Vitamin (MULTIVITAMIN) tablet Take 1 tablet by mouth daily.     Yes Historical Provider, MD  Multiple Vitamins-Minerals (PRESERVISION/LUTEIN) CAPS Take 1 capsule by mouth daily.  08/13/10  Yes Roger Shelter, MD  NITROSTAT 0.4 MG SL tablet Take 1 tablet by mouth every 5 (five) minutes x 3 doses as needed. For chest pain. 05/18/11  Yes Historical Provider, MD  omeprazole (PRILOSEC) 20 MG capsule Take 20 mg by mouth daily.   Yes Historical Provider, MD  pantoprazole (PROTONIX) 40 MG tablet Take 1 tablet by mouth Daily. 06/14/11  Yes Historical Provider, MD  Polyethyl Glycol-Propyl Glycol (SYSTANE OP) Apply to eye 4 (four) times daily.     Yes Historical Provider, MD  Ticagrelor (BRILINTA) 90 MG TABS tablet Take 90 mg by mouth 2 (two) times daily. 05/03/11  Yes Vesta Mixer, MD    Allergies:  Allergies  Allergen Reactions  . Metformin And Related Nausea  And Vomiting    Social History:   reports that he quit smoking about 17 years ago. He does not have any smokeless tobacco history on file. He reports that he does not drink alcohol or use illicit drugs.  Family History: Family History  Problem Relation Age of Onset  . Cancer Mother   . Ulcers Father     Review of Systems:  The patient denies anorexia, fever, weight loss, vision loss, decreased hearing, hoarseness, syncope, peripheral edema, balance deficits, hemoptysis, abdominal pain, melena, hematochezia, severe indigestion/heartburn, hematuria, incontinence, genital sores, muscle weakness, suspicious skin lesions, transient blindness, difficulty walking, depression, unusual weight change, abnormal bleeding,  enlarged lymph nodes, angioedema, and breast masses.   Physical Exam:  GEN:  Pleasant 78 year old well nourished well developed Caucasian male examined  and in no acute distress; cooperative with exam Filed Vitals:   07/16/11 2219 07/17/11 0017 07/17/11 0019 07/17/11 0140  BP: 149/71  140/60 148/77  Pulse: 91  96 102  Temp: 98.2 F (36.8 C)   98.4 F (36.9 C)  TempSrc: Oral   Oral  Resp: 17  18 20   Height:    5\' 9"  (1.753 m)  Weight:    75.2 kg (165 lb 12.6 oz)  SpO2: 93% 87%  97%   Blood pressure 148/77, pulse 102, temperature 98.4 F (36.9 C), temperature source Oral, resp. rate 20, height 5\' 9"  (1.753 m), weight 75.2 kg (165 lb 12.6 oz), SpO2 97.00%. PSYCH: He is alert and oriented x4; does not appear anxious does not appear depressed; affect is normal HEENT: Normocephalic and Atraumatic, Mucous membranes pink; PERRLA; EOM intact; Fundi:  Benign;  No scleral icterus, Nares: Patent, Oropharynx: Clear, Fair Dentition, Neck:  FROM, no cervical lymphadenopathy nor thyromegaly or carotid bruit; no JVD; Breasts:: Not examined CHEST WALL: No tenderness CHEST: Normal respiration, clear to auscultation bilaterally HEART: Regular rate and rhythm; no murmurs rubs or gallops BACK: No kyphosis or scoliosis; no CVA tenderness ABDOMEN: Positive Bowel Sounds, Scaphoid, soft non-tender; no masses, no organomegaly.   Rectal Exam: Not done EXTREMITIES: No bone or joint deformity; age-appropriate arthropathy of the hands and knees; no cyanosis, clubbing or edema; no ulcerations. Genitalia: not examined PULSES: 2+ and symmetric SKIN: Normal hydration no rash or ulceration CNS: Cranial nerves 2-12 grossly intact no focal neurologic deficit   Labs & Imaging Results for orders placed during the hospital encounter of 07/16/11 (from the past 48 hour(s))  CBC     Status: Abnormal   Collection Time   07/16/11 10:33 PM      Component Value Range Comment   WBC 9.7  4.0 - 10.5 (K/uL)    RBC 3.73 (*) 4.22  - 5.81 (MIL/uL)    Hemoglobin 12.9 (*) 13.0 - 17.0 (g/dL)    HCT 16.1 (*) 09.6 - 52.0 (%)    MCV 99.5  78.0 - 100.0 (fL)    MCH 34.6 (*) 26.0 - 34.0 (pg)    MCHC 34.8  30.0 - 36.0 (g/dL)    RDW 04.5  40.9 - 81.1 (%)    Platelets 170  150 - 400 (K/uL)   DIFFERENTIAL     Status: Abnormal   Collection Time   07/16/11 10:33 PM      Component Value Range Comment   Neutrophils Relative 91 (*) 43 - 77 (%)    Neutro Abs 8.8 (*) 1.7 - 7.7 (K/uL)    Lymphocytes Relative 3 (*) 12 - 46 (%)    Lymphs Abs 0.3 (*)  0.7 - 4.0 (K/uL)    Monocytes Relative 5  3 - 12 (%)    Monocytes Absolute 0.5  0.1 - 1.0 (K/uL)    Eosinophils Relative 1  0 - 5 (%)    Eosinophils Absolute 0.1  0.0 - 0.7 (K/uL)    Basophils Relative 0  0 - 1 (%)    Basophils Absolute 0.0  0.0 - 0.1 (K/uL)   BASIC METABOLIC PANEL     Status: Abnormal   Collection Time   07/16/11 10:33 PM      Component Value Range Comment   Sodium 140  135 - 145 (mEq/L)    Potassium 4.2  3.5 - 5.1 (mEq/L)    Chloride 106  96 - 112 (mEq/L)    CO2 21  19 - 32 (mEq/L)    Glucose, Bld 145 (*) 70 - 99 (mg/dL)    BUN 29 (*) 6 - 23 (mg/dL)    Creatinine, Ser 7.82  0.50 - 1.35 (mg/dL)    Calcium 9.5  8.4 - 10.5 (mg/dL)    GFR calc non Af Amer 56 (*) >90 (mL/min)    GFR calc Af Amer 65 (*) >90 (mL/min)   CARDIAC PANEL(CRET KIN+CKTOT+MB+TROPI)     Status: Normal   Collection Time   07/16/11 10:33 PM      Component Value Range Comment   Total CK 102  7 - 232 (U/L)    CK, MB 2.5  0.3 - 4.0 (ng/mL)    Troponin I <0.30  <0.30 (ng/mL)    Relative Index 2.5  0.0 - 2.5    PRO B NATRIURETIC PEPTIDE     Status: Normal   Collection Time   07/16/11 11:50 PM      Component Value Range Comment   Pro B Natriuretic peptide (BNP) 360.6  0 - 450 (pg/mL)   D-DIMER, QUANTITATIVE     Status: Abnormal   Collection Time   07/17/11 12:29 AM      Component Value Range Comment   D-Dimer, Quant 0.67 (*) 0.00 - 0.48 (ug/mL-FEU)   CARDIAC PANEL(CRET KIN+CKTOT+MB+TROPI)      Status: Normal   Collection Time   07/17/11  2:03 AM      Component Value Range Comment   Total CK 86  7 - 232 (U/L)    CK, MB 2.5  0.3 - 4.0 (ng/mL)    Troponin I <0.30  <0.30 (ng/mL)    Relative Index RELATIVE INDEX IS INVALID  0.0 - 2.5    CBC     Status: Abnormal   Collection Time   07/17/11  2:04 AM      Component Value Range Comment   WBC 9.6  4.0 - 10.5 (K/uL)    RBC 3.70 (*) 4.22 - 5.81 (MIL/uL)    Hemoglobin 12.6 (*) 13.0 - 17.0 (g/dL)    HCT 95.6 (*) 21.3 - 52.0 (%)    MCV 100.0  78.0 - 100.0 (fL)    MCH 34.1 (*) 26.0 - 34.0 (pg)    MCHC 34.1  30.0 - 36.0 (g/dL)    RDW 08.6  57.8 - 46.9 (%)    Platelets 158  150 - 400 (K/uL)   CREATININE, SERUM     Status: Abnormal   Collection Time   07/17/11  2:04 AM      Component Value Range Comment   Creatinine, Ser 1.06  0.50 - 1.35 (mg/dL)    GFR calc non Af Amer 61 (*) >90 (mL/min)  GFR calc Af Amer 71 (*) >90 (mL/min)    Dg Chest 2 View  07/16/2011  *RADIOLOGY REPORT*  Clinical Data: Left chest pressure.  CHEST - 2 VIEW  Comparison: 02/05/2011.  Findings: The cardiac silhouette remains borderline enlarged. Mildly progressive diffuse prominence of the interstitial markings. No pleural fluid.  Thoracic spine degenerative changes. Cholecystectomy clips.  IMPRESSION: Progressive interstitial lung disease.  Original Report Authenticated By: Darrol Angel, M.D.      Assessment/Plan: Present on Admission:  .SOB (shortness of breath) .Chest tightness CAD HTN DM2 Hyperlipidemia    Plan:    1.  Admit to Telemetry Bed, Cardiac Enzymes ordered, and Cardiogy Consulted ([place]).   2.  SOB may be due to Acute on Chronic CHF (BNP= 380), and CXR reveals Interstitial lung changes, so the CHF Protocol was initiated, and Diurese with Gentle IV lasix, and a 2-D ECHO study was ordered.  Patietn also placed on IV antibiotic therpay of Rocephin and Azithromycin empirically.   3.  REconcile home meds 4.  SSI coverage PRN. 5.  Further workup  Pending results and patient's clinical course.   6.  DVT prophylaxis 7.  Other plans as per orders.    CODE STATUS:      FULL CODE         Kharter Brew C 07/17/2011, 5:08 AM

## 2011-07-17 NOTE — Progress Notes (Signed)
Patient ID: [name], male   DOB: 1924-03-22, 78 y.o.   MRN: [id number]    Consult note reviewed.  Cardiac enzymes so far negative.  If he completes 3 negative sets with no further chest pain, may be discharged for outpatient myoview as planned next week.   Marca Ancona 07/17/2011 12:04 PM

## 2011-07-18 ENCOUNTER — Inpatient Hospital Stay (HOSPITAL_COMMUNITY): Payer: Medicare Other

## 2011-07-18 ENCOUNTER — Other Ambulatory Visit: Payer: Self-pay

## 2011-07-18 DIAGNOSIS — I4892 Unspecified atrial flutter: Secondary | ICD-10-CM | POA: Insufficient documentation

## 2011-07-18 DIAGNOSIS — R079 Chest pain, unspecified: Secondary | ICD-10-CM

## 2011-07-18 LAB — PROTIME-INR: Prothrombin Time: 15.8 seconds — ABNORMAL HIGH (ref 11.6–15.2)

## 2011-07-18 LAB — COMPREHENSIVE METABOLIC PANEL
Albumin: 3.2 g/dL — ABNORMAL LOW (ref 3.5–5.2)
Alkaline Phosphatase: 42 U/L (ref 39–117)
BUN: 27 mg/dL — ABNORMAL HIGH (ref 6–23)
CO2: 23 mEq/L (ref 19–32)
Chloride: 106 mEq/L (ref 96–112)
GFR calc non Af Amer: 51 mL/min — ABNORMAL LOW (ref >90)
Glucose, Bld: 85 mg/dL (ref 70–99)
Potassium: 3.5 mEq/L (ref 3.5–5.1)
Total Bilirubin: 0.4 mg/dL (ref 0.3–1.2)

## 2011-07-18 LAB — CBC
HCT: 36 % — ABNORMAL LOW (ref 39.0–52.0)
Hemoglobin: 12 g/dL — ABNORMAL LOW (ref 13.0–17.0)
RBC: 3.58 MIL/uL — ABNORMAL LOW (ref 4.22–5.81)
RDW: 13.8 % (ref 11.5–15.5)
WBC: 6.5 10*3/uL (ref 4.0–10.5)

## 2011-07-18 MED ORDER — ENOXAPARIN SODIUM 40 MG/0.4ML ~~LOC~~ SOLN: 40.0000 mg | Freq: Two times a day (BID) | SUBCUTANEOUS | Status: DC

## 2011-07-18 MED ORDER — FUROSEMIDE 10 MG/ML IJ SOLN
20.0000 mg | Freq: Two times a day (BID) | INTRAMUSCULAR | Status: AC
  Administered 2011-07-18 – 2011-07-19 (×3): 20 mg via INTRAVENOUS
  Filled 2011-07-18 (×3): qty 2

## 2011-07-18 MED ORDER — METOPROLOL TARTRATE 1 MG/ML IV SOLN
INTRAVENOUS | Status: AC
  Administered 2011-07-18: 5 mg
  Filled 2011-07-18: qty 5

## 2011-07-18 MED ORDER — METOPROLOL SUCCINATE ER 50 MG PO TB24
50.0000 mg | ORAL_TABLET | Freq: Every day | ORAL | Status: DC
  Administered 2011-07-18 – 2011-07-19 (×2): 50 mg via ORAL
  Filled 2011-07-18 (×3): qty 1

## 2011-07-18 MED ORDER — WARFARIN VIDEO
Freq: Once | Status: AC
  Administered 2011-07-18: 09:00:00

## 2011-07-18 MED ORDER — METOPROLOL TARTRATE 1 MG/ML IV SOLN
5.0000 mg | Freq: Once | INTRAVENOUS | Status: AC
  Administered 2011-07-18: 5 mg via INTRAVENOUS

## 2011-07-18 MED ORDER — WARFARIN - PHARMACIST DOSING INPATIENT: Freq: Every day | Status: DC

## 2011-07-18 MED ORDER — WARFARIN SODIUM 5 MG PO TABS
5.0000 mg | ORAL_TABLET | ORAL | Status: AC
  Administered 2011-07-18: 5 mg via ORAL
  Filled 2011-07-18 (×2): qty 1

## 2011-07-18 MED ORDER — ENOXAPARIN SODIUM 40 MG/0.4ML ~~LOC~~ SOLN
40.0000 mg | SUBCUTANEOUS | Status: DC
  Administered 2011-07-18 – 2011-07-19 (×2): 40 mg via SUBCUTANEOUS
  Filled 2011-07-18 (×2): qty 0.4

## 2011-07-18 MED ORDER — PATIENT'S GUIDE TO USING COUMADIN BOOK
Freq: Once | Status: AC
  Administered 2011-07-18: 11:00:00
  Filled 2011-07-18: qty 1

## 2011-07-18 MED ORDER — METOPROLOL TARTRATE 1 MG/ML IV SOLN
INTRAVENOUS | Status: AC
  Administered 2011-07-18: 08:00:00
  Filled 2011-07-18: qty 5

## 2011-07-18 NOTE — Progress Notes (Signed)
Pt ambulated to the bathroom, heart rate was in 140's , A fib. Pt resting in bed now, heart rate back down to 96, A fib. BP taken, see doc flow sheet. PA notified, no new orders at this time. Will continue to monitor pt.

## 2011-07-18 NOTE — Progress Notes (Addendum)
SUBJECTIVE:No complaints at present. Patient developed atrial fib/flutter with RVR this am. Denies palpitations, CP or SOB. ECG with mild ST depression. Given 1 dose of IV lopressor and rate now down near 100. Denies any known h/o AF. Eating breakfast now and is eager to go home.   Principal Problem:  *SOB (shortness of breath) Active Problems:  Chest tightness   LABS: Basic Metabolic Panel:  Basename 07/18/11 0515 07/17/11 0719  NA 138 141  K 3.5 4.2  CL 106 107  CO2 23 22  GLUCOSE 85 227*  BUN 27* 30*  CREATININE 1.23 1.15  CALCIUM 8.4 8.9  MG -- --  PHOS -- --   Liver Function Tests:  [facility] 07/18/11 0515  AST 24  ALT 15  ALKPHOS 42  BILITOT 0.4  PROT 6.3  ALBUMIN 3.2*   No results found for this basename: LIPASE:2,AMYLASE:2 in the last 72 hours CBC:  Basename 07/18/11 0515 07/17/11 0719 07/16/11 2233  WBC 6.5 8.6 --  NEUTROABS -- -- 8.8*  HGB 12.0* 12.5* --  HCT 36.0* 37.0* --  MCV 100.6* 100.8* --  PLT 146* 147* --   Cardiac Enzymes:  Basename 07/17/11 1844 07/17/11 0934 07/17/11 0203  CKTOTAL 112 106 86  CKMB 3.0 3.2 2.5  CKMBINDEX -- -- --  TROPONINI <0.30 <0.30 <0.30   BNP: No components found with this basename: POCBNP:3 D-Dimer:  Alvira Philips 07/17/11 0029  DDIMER 0.67*   Hemoglobin A1C: No results found for this basename: HGBA1C in the last 72 hours Fasting Lipid Panel: No results found for this basename: CHOL,HDL,LDLCALC,TRIG,CHOLHDL,LDLDIRECT in the last 72 hours Thyroid Function Tests:  Basename 07/17/11 0204  TSH 1.704  T4TOTAL --  T3FREE --  THYROIDAB --    RADIOLOGY: Dg Chest 2 View  07/16/2011  *RADIOLOGY REPORT*  Clinical Data: Left chest pressure.  CHEST - 2 VIEW  Comparison: 02/05/2011.  Findings: The cardiac silhouette remains borderline enlarged. Mildly progressive diffuse prominence of the interstitial markings. No pleural fluid.  Thoracic spine degenerative changes. Cholecystectomy clips.  IMPRESSION: Progressive  interstitial lung disease.  Original Report Authenticated By: Darrol Angel, M.D.     PHYSICAL EXAM BP 118/60  Pulse 162  Temp(Src) 98 F (36.7 C) (Oral)  Resp 18  Ht 5\' 9"  (1.753 m)  Wt 158 lb 11.7 oz (72 kg)  BMI 23.44 kg/m2  SpO2 95% General: Well developed, well nourished, in no acute distress Head: Eyes PERRLA, No xanthomas.   Normal cephalic and atramatic  Lungs: Mild bibasilar crackles, no wheezes. No cough Heart: Distant  HS IRR, IRR tachycardic. No MRG .  Pulses are 2+ & equal.            No carotid bruit. No JVD.  No abdominal bruits. No femoral bruits. Abdomen: Bowel sounds are positive, abdomen soft and non-tender without masses or                  Hernia's noted. Msk:  Back normal, normal gait. Normal strength and tone for age. Extremities: No clubbing, cyanosis or edema.  DP +1 Neuro: Alert and oriented X 3. Psych:  Good affect, responds appropriately  TELEMETRY: Reviewed telemetry pt in: Afib/flutter rates to 168  ASSESSMENT AND PLAN:  1. Atrial fib/flutter:  HR very elevated this morning, but asymptomatic.  I will give one dose of 5mg  IV and restart all of his home medications. No history of Atrial fib. CHADs score of 3, hypertension, diabetes, age. Will repeat EKG after lopressor on board. Increase LMWH  to BID.  2. CAD: Stent to LAD in 2012. He is without complaint of chest pain at this time. Restart BB and ACE. Cardiac enzymes    Bettey Mare. Lyman Bishop NP [facility] 07/18/2011, 7:51 AM   Patient seen and examined independently.  Latanya Maudlin, NP note reviewed carefully - agree with his assessment and plan. I have edited the note based on my findings.   ECG looks like AFL with RVR. He is asymptomatic. Agree with giving IV lopressor then starting back on his home dose of Toprol (or higher). Will also need to start coumadin - no need for heparin bridge. If converts spontaneously can go home later today with coumadin and 2 week event monitor to  assess burden of AF. If doesn't convert would keep NPO for DC-CV later today or first thing tomorrow. Cardiac markers remain negative so I wonder if PAF was part of his presenting symptoms. Will need outpatient Myoview as well. Will follow today.  Time spent 35 mins.   Kaiyla Stahly,MD 8:19 AM

## 2011-07-18 NOTE — Progress Notes (Signed)
Notified of pts heart rate of 140's aflutter. Stat EKG obtained.vs 118/60 manuel. Pt states he feels fine. Hr increased to 160 afib. Paged dr. Clent Ridges and made aware of above and that pt was not started on home medication; also made NP [facility] aware(on unit). Orders were given. Gave pt 5mg  of lopressor iv. Monitoring will continue.

## 2011-07-18 NOTE — Progress Notes (Signed)
Subjective: Presented with shortness of breath, chest tightness. Developed profuse vomiting in the ED.  Presenting symptoms have completely resolved.  Now feeling well, no complaints. Has eaten full breakfast.    Objective: Vital signs in last 24 hours: Temp:  [97.3 F (36.3 C)-100.3 F (37.9 C)] 98 F (36.7 C) (03/10 0719) Pulse Rate:  [91-162] 91  (03/10 0842) Resp:  [18-20] 20  (03/10 0754) BP: (102-130)/(50-73) 104/62 mmHg (03/10 0842) SpO2:  [82 %-95 %] 95 % (03/10 0719) Weight:  [72 kg (158 lb 11.7 oz)] 72 kg (158 lb 11.7 oz) (03/10 0622) Weight change: -3.2 kg (-7 lb 0.9 oz) Last BM Date: 07/17/11  Intake/Output from previous day: 03/09 0701 - 03/10 0700 In: 600 [P.O.:360; I.V.:240] Out: 400 [Urine:400] Intake/Output this shift:    General appearance: alert, cooperative and no distress Resp: clear to auscultation bilaterally Cardio: distant, tachycardic, sound regular, no mrg GI: bs increased, slightly distended, non tender, no gaurding Extremities: extremities normal, atraumatic, no cyanosis or edema  Lab Results:  [facility] 07/18/11 0515 07/17/11 0719  WBC 6.5 8.6  HGB 12.0* 12.5*  HCT 36.0* 37.0*  PLT 146* 147*   BMET  Basename 07/18/11 0515 07/17/11 0719  NA 138 141  K 3.5 4.2  CL 106 107  CO2 23 22  GLUCOSE 85 227*  BUN 27* 30*  CREATININE 1.23 1.15  CALCIUM 8.4 8.9    Studies/Results: Dg Chest 2 View  07/16/2011  *RADIOLOGY REPORT*  Clinical Data: Left chest pressure.  CHEST - 2 VIEW  Comparison: 02/05/2011.  Findings: The cardiac silhouette remains borderline enlarged. Mildly progressive diffuse prominence of the interstitial markings. No pleural fluid.  Thoracic spine degenerative changes. Cholecystectomy clips.  IMPRESSION: Progressive interstitial lung disease.  Original Report Authenticated By: Darrol Angel, M.D.    Medications:  I have reviewed the patient's current medications. Scheduled:   . azithromycin  500 mg Intravenous Q24H  .  cefTRIAXone (ROCEPHIN)  IV  1 g Intravenous Q24H  . azithromycin (ZITHROMAX) 500 MG IVPB      . enoxaparin  40 mg Subcutaneous Q24H  . furosemide  20 mg Intravenous Q12H  . lisinopril  5 mg Oral Daily  . metoprolol      . metoprolol      . metoprolol  5 mg Intravenous Once  . metoprolol  5 mg Intravenous Once  . metoprolol succinate  50 mg Oral Daily  . patient's guide to using coumadin book   Does not apply Once  . potassium chloride  10 mEq Oral BID  . sodium chloride  3 mL Intravenous Q12H  . sodium chloride  3 mL Intravenous Q12H  . warfarin  5 mg Oral NOW  . warfarin   Does not apply Once  . Warfarin - Pharmacist Dosing Inpatient   Does not apply q1800  . DISCONTD: enoxaparin  40 mg Subcutaneous Q24H  . DISCONTD: enoxaparin  40 mg Subcutaneous Q12H  . DISCONTD: furosemide  20 mg Intravenous Q12H   Continuous:   . sodium chloride 20 mL/hr at 07/17/11 0407   JYN:WGNFAO chloride, acetaminophen, acetaminophen, acetaminophen, alum & mag hydroxide-simeth, HYDROmorphone, ondansetron (ZOFRAN) IV, ondansetron, oxyCODONE, sodium chloride, zolpidem, DISCONTD: ondansetron (ZOFRAN) IV, DISCONTD: ondansetron (ZOFRAN) IV, DISCONTD: ondansetron  Assessment/Plan: 1)dyspnea, chest tightness: resolved 2) nausea and vomiting: resolved 3) atrial fibrillation vs atrial flutter: started this morning, has had rates 160-120s for several hours. Asymptomatic. Has received IV and PO metoprolol and still in 120's.  Plan per cardiology is for NPO  today, probable cardioversion in am.  If converts to nsr and remains there for most of today would consider dc with monitor. 4) hypoxia: sat 82% this am, improved on 2L02. Does not wear 02 at home. cxr mentions interstitial lung disease-progressive chronic. Will try off of 02 this afternoon. No respiratory distress. 5) anemia: stable chronic, has seen Dr. Cyndie Chime, likely low grade myelodysplastic syndrome 6) history of hyperglycemia vs diet controled DM2: check  am glucose. 7) dispo: continue to follow HR today. Likely cardioversion in am.   LOS: 2 days   Juda Lajeunesse 07/18/2011, 9:16 AM  A

## 2011-07-18 NOTE — Progress Notes (Signed)
ANTICOAGULATION CONSULT NOTE - Initial Consult  Pharmacy Consult for Coumadin Indication: atrial fibrillation  Allergies  Allergen Reactions  . Metformin And Related Nausea And Vomiting    Patient Measurements: Height: 5\' 9"  (175.3 cm) Weight: 158 lb 11.7 oz (72 kg) (standing scale b) IBW/kg (Calculated) : 70.7   Vital Signs: Temp: 98 F (36.7 C) (03/10 0719) Temp src: Oral (03/10 0719) BP: 123/73 mmHg (03/10 0754) Pulse Rate: 112  (03/10 0754)  Labs:  Alvira Philips 07/18/11 0515 07/17/11 1844 07/17/11 0934 07/17/11 0719 07/17/11 0204 07/17/11 0203  HGB 12.0* -- -- 12.5* -- --  HCT 36.0* -- -- 37.0* 37.0* --  PLT 146* -- -- 147* 158 --  APTT -- -- -- -- -- --  LABPROT -- -- -- -- -- --  INR -- -- -- -- -- --  HEPARINUNFRC -- -- -- -- -- --  CREATININE 1.23 -- -- 1.15 1.06 --  CKTOTAL -- 112 106 -- -- 86  CKMB -- 3.0 3.2 -- -- 2.5  TROPONINI -- <0.30 <0.30 -- -- <0.30   Estimated Creatinine Clearance: 43.1 ml/min (by C-G formula based on Cr of 1.23).  Medical History: Past Medical History  Diagnosis Date  . Coronary artery disease     s/p PCI to LAD 08/05/10  . Hypertension   . Diabetes mellitus   . Hyperlipidemia   . Barrett esophagus   . Diabetes mellitus   . Gallstone pancreatitis 2011  . Nephrolithiasis   . SBO (small bowel obstruction) 2005  . Colon cancer   . Macrocytic anemia 06/25/2011    Medications:  Prescriptions prior to admission  Medication Sig Dispense Refill  . aspirin EC 81 MG tablet Take 81 mg by mouth daily.      [name] atorvastatin (LIPITOR) 40 MG tablet Take 40 mg by mouth daily.      . Cholecalciferol (VITAMIN D-3 PO) Take 1 tablet by mouth daily.       [name] glimepiride (AMARYL) 4 MG tablet Take 4 mg by mouth daily before breakfast.        . isosorbide mononitrate (IMDUR) 30 MG 24 hr tablet Take 30 mg by mouth daily.      [name] linagliptin (TRADJENTA) 5 MG TABS tablet Take 5 mg by mouth daily.        . Melatonin 10 MG CAPS Take 10 mg by mouth daily.  For Sleep      . metoprolol succinate (TOPROL-XL) 50 MG 24 hr tablet Take 50 mg by mouth daily. Take with or immediately following a meal.      . Multiple Vitamin (MULTIVITAMIN) tablet Take 1 tablet by mouth daily.        . Multiple Vitamins-Minerals (PRESERVISION/LUTEIN) CAPS Take 1 capsule by mouth daily.     0  . NITROSTAT 0.4 MG SL tablet Take 1 tablet by mouth every 5 (five) minutes x 3 doses as needed. For chest pain.      [name] omeprazole (PRILOSEC) 20 MG capsule Take 20 mg by mouth daily.      . pantoprazole (PROTONIX) 40 MG tablet Take 1 tablet by mouth Daily.      [name] Glycol-Propyl Glycol (SYSTANE OP) Apply to eye 4 (four) times daily.        . Ticagrelor (BRILINTA) 90 MG TABS tablet Take 90 mg by mouth 2 (two) times daily.        Assessment: 78 y.o. male found to be in new onset afib with RVR. CHADs score=3. To begin coumadin,  no need for heparin bridge per MD note. Noted plan to send home if spontaneously converts later today or DC-CV later today or first thing in a.m. Baseline INR 1.23.  Goal of Therapy:  INR 2-3   Plan:  1. Coumadin 5mg  po now (give first dose per MD now) 2. Daily PT/INR 3. Coumadin educational book and video to bedside 4. F/u home med reconciliation if pt remains in hospital  Christoper Fabian, PharmD, BCPS Clinical pharmacist, pager [phone] 07/18/2011,8:23 AM

## 2011-07-19 ENCOUNTER — Encounter (HOSPITAL_COMMUNITY): Payer: Self-pay | Admitting: Cardiology

## 2011-07-19 ENCOUNTER — Other Ambulatory Visit: Payer: Self-pay

## 2011-07-19 DIAGNOSIS — I4891 Unspecified atrial fibrillation: Secondary | ICD-10-CM

## 2011-07-19 DIAGNOSIS — R0602 Shortness of breath: Secondary | ICD-10-CM

## 2011-07-19 LAB — BASIC METABOLIC PANEL
CO2: 22 mEq/L (ref 19–32)
Glucose, Bld: 117 mg/dL — ABNORMAL HIGH (ref 70–99)
Potassium: 3.9 mEq/L (ref 3.5–5.1)
Sodium: 140 mEq/L (ref 135–145)

## 2011-07-19 LAB — CBC
Hemoglobin: 13.1 g/dL (ref 13.0–17.0)
RBC: 3.87 MIL/uL — ABNORMAL LOW (ref 4.22–5.81)
WBC: 4.9 10*3/uL (ref 4.0–10.5)

## 2011-07-19 LAB — GLUCOSE, CAPILLARY
Glucose-Capillary: 149 mg/dL — ABNORMAL HIGH (ref 70–99)
Glucose-Capillary: 165 mg/dL — ABNORMAL HIGH (ref 70–99)

## 2011-07-19 LAB — PROTIME-INR
INR: 1.2 (ref 0.00–1.49)
Prothrombin Time: 15.5 seconds — ABNORMAL HIGH (ref 11.6–15.2)

## 2011-07-19 MED ORDER — HEPARIN (PORCINE) IN NACL 100-0.45 UNIT/ML-% IJ SOLN
1000.0000 [IU]/h | INTRAMUSCULAR | Status: DC
  Administered 2011-07-19 – 2011-07-20 (×2): 1000 [IU]/h via INTRAVENOUS
  Filled 2011-07-19 (×3): qty 250

## 2011-07-19 MED ORDER — ISOSORBIDE MONONITRATE ER 30 MG PO TB24
30.0000 mg | ORAL_TABLET | Freq: Every day | ORAL | Status: DC
  Administered 2011-07-19 – 2011-07-22 (×4): 30 mg via ORAL
  Filled 2011-07-19 (×4): qty 1

## 2011-07-19 MED ORDER — SODIUM CHLORIDE 0.9 % IJ SOLN: 3.0000 mL | INTRAMUSCULAR | Status: DC | PRN

## 2011-07-19 MED ORDER — LINAGLIPTIN 5 MG PO TABS
5.0000 mg | ORAL_TABLET | Freq: Every day | ORAL | Status: DC
  Administered 2011-07-19 – 2011-07-22 (×4): 5 mg via ORAL
  Filled 2011-07-19 (×4): qty 1

## 2011-07-19 MED ORDER — HYDROCORTISONE 1 % EX CREA
1.0000 "application " | TOPICAL_CREAM | Freq: Three times a day (TID) | CUTANEOUS | Status: DC | PRN
  Filled 2011-07-19: qty 28

## 2011-07-19 MED ORDER — INSULIN ASPART 100 UNIT/ML ~~LOC~~ SOLN
3.0000 [IU] | Freq: Three times a day (TID) | SUBCUTANEOUS | Status: DC
  Administered 2011-07-19 – 2011-07-22 (×5): 3 [IU] via SUBCUTANEOUS

## 2011-07-19 MED ORDER — WARFARIN SODIUM 5 MG PO TABS
5.0000 mg | ORAL_TABLET | Freq: Once | ORAL | Status: AC
  Administered 2011-07-19: 5 mg via ORAL
  Filled 2011-07-19: qty 1

## 2011-07-19 MED ORDER — METOPROLOL TARTRATE 25 MG PO TABS
25.0000 mg | ORAL_TABLET | Freq: Four times a day (QID) | ORAL | Status: DC
  Administered 2011-07-19 – 2011-07-20 (×4): 25 mg via ORAL
  Filled 2011-07-19 (×8): qty 1

## 2011-07-19 MED ORDER — REGADENOSON 0.4 MG/5ML IV SOLN
0.4000 mg | Freq: Once | INTRAVENOUS | Status: DC
  Filled 2011-07-19: qty 5

## 2011-07-19 MED ORDER — INSULIN ASPART 100 UNIT/ML ~~LOC~~ SOLN
0.0000 [IU] | Freq: Three times a day (TID) | SUBCUTANEOUS | Status: DC
  Administered 2011-07-19: 2 [IU] via SUBCUTANEOUS
  Administered 2011-07-19: 1 [IU] via SUBCUTANEOUS
  Administered 2011-07-20 – 2011-07-21 (×2): 2 [IU] via SUBCUTANEOUS
  Administered 2011-07-21 – 2011-07-22 (×2): 1 [IU] via SUBCUTANEOUS
  Filled 2011-07-19: qty 0.09
  Filled 2011-07-19: qty 3

## 2011-07-19 MED ORDER — SODIUM CHLORIDE 0.9 % IV SOLN: 250.0000 mL | INTRAVENOUS | Status: DC

## 2011-07-19 MED ORDER — HEPARIN BOLUS VIA INFUSION
4000.0000 [IU] | Freq: Once | INTRAVENOUS | Status: DC
  Filled 2011-07-19: qty 4000

## 2011-07-19 MED ORDER — TICAGRELOR 90 MG PO TABS
90.0000 mg | ORAL_TABLET | Freq: Two times a day (BID) | ORAL | Status: DC
  Administered 2011-07-19 – 2011-07-20 (×4): 90 mg via ORAL
  Filled 2011-07-19 (×6): qty 1

## 2011-07-19 MED ORDER — INSULIN ASPART 100 UNIT/ML ~~LOC~~ SOLN
0.0000 [IU] | Freq: Every day | SUBCUTANEOUS | Status: DC
  Filled 2011-07-19: qty 0.05

## 2011-07-19 MED ORDER — SODIUM CHLORIDE 0.9 % IJ SOLN
3.0000 mL | Freq: Two times a day (BID) | INTRAMUSCULAR | Status: DC
  Administered 2011-07-19: 3 mL via INTRAVENOUS

## 2011-07-19 MED ORDER — PANTOPRAZOLE SODIUM 40 MG PO TBEC
40.0000 mg | DELAYED_RELEASE_TABLET | Freq: Every day | ORAL | Status: DC
  Administered 2011-07-19 – 2011-07-21 (×3): 40 mg via ORAL
  Filled 2011-07-19 (×3): qty 1

## 2011-07-19 MED ORDER — ATORVASTATIN CALCIUM 40 MG PO TABS
40.0000 mg | ORAL_TABLET | Freq: Every day | ORAL | Status: DC
  Administered 2011-07-19 – 2011-07-21 (×3): 40 mg via ORAL
  Filled 2011-07-19 (×4): qty 1

## 2011-07-19 NOTE — Progress Notes (Signed)
Bilateral lower extremity venous duplex completed.  Preliminary report is negative for DVT, SVT, or a Baker's cyst. 

## 2011-07-19 NOTE — Progress Notes (Addendum)
ANTICOAGULATION CONSULT NOTE -follow up Pharmacy Consult for Coumadin, Heparin to start today Indication: atrial fibrillation  Allergies  Allergen Reactions  . Metformin And Related Nausea And Vomiting    Patient Measurements: Height: 5\' 9"  (175.3 cm) Weight: 157 lb 3 oz (71.3 kg) IBW/kg (Calculated) : 70.7   Vital Signs: Temp: 98.1 F (36.7 C) (03/11 0537) Temp src: Oral (03/11 0537) BP: 121/67 mmHg (03/11 0537)  Labs:  Alvira Philips 07/19/11 0450 07/18/11 1610 07/18/11 0515 07/17/11 1844 07/17/11 0934 07/17/11 0719 07/17/11 0203  HGB 13.1 -- 12.0* -- -- -- --  HCT 38.6* -- 36.0* -- -- 37.0* --  PLT 150 -- 146* -- -- 147* --  APTT -- -- -- -- -- -- --  LABPROT 15.5* 15.8* -- -- -- -- --  INR 1.20 1.23 -- -- -- -- --  HEPARINUNFRC -- -- -- -- -- -- --  CREATININE 1.33 -- 1.23 -- -- 1.15 --  CKTOTAL -- -- -- 112 106 -- 86  CKMB -- -- -- 3.0 3.2 -- 2.5  TROPONINI -- -- -- <0.30 <0.30 -- <0.30   Estimated Creatinine Clearance: 39.9 ml/min (by C-G formula based on Cr of 1.33).  Medical History: Past Medical History  Diagnosis Date  . Coronary artery disease     s/p PCI to LAD 08/05/10  . Hypertension   . Diabetes mellitus   . Hyperlipidemia   . Barrett esophagus   . Diabetes mellitus   . Gallstone pancreatitis 2011  . Nephrolithiasis   . SBO (small bowel obstruction) 2005  . Colon cancer   . Macrocytic anemia 06/25/2011    Medications:  Prescriptions prior to admission  Medication Sig Dispense Refill  . aspirin EC 81 MG tablet Take 81 mg by mouth daily.      [name] atorvastatin (LIPITOR) 40 MG tablet Take 40 mg by mouth daily.      . Cholecalciferol (VITAMIN D-3 PO) Take 1 tablet by mouth daily.       [name] glimepiride (AMARYL) 4 MG tablet Take 4 mg by mouth daily before breakfast.        . isosorbide mononitrate (IMDUR) 30 MG 24 hr tablet Take 30 mg by mouth daily.      [name] linagliptin (TRADJENTA) 5 MG TABS tablet Take 5 mg by mouth daily.        . Melatonin 10 MG CAPS Take  10 mg by mouth daily. For Sleep      . metoprolol succinate (TOPROL-XL) 50 MG 24 hr tablet Take 50 mg by mouth daily. Take with or immediately following a meal.      . Multiple Vitamin (MULTIVITAMIN) tablet Take 1 tablet by mouth daily.        . Multiple Vitamins-Minerals (PRESERVISION/LUTEIN) CAPS Take 1 capsule by mouth daily.     0  . NITROSTAT 0.4 MG SL tablet Take 1 tablet by mouth every 5 (five) minutes x 3 doses as needed. For chest pain.      [name] omeprazole (PRILOSEC) 20 MG capsule Take 20 mg by mouth daily.      . pantoprazole (PROTONIX) 40 MG tablet Take 1 tablet by mouth Daily.      [name] Glycol-Propyl Glycol (SYSTANE OP) Apply to eye 4 (four) times daily.        . Ticagrelor (BRILINTA) 90 MG TABS tablet Take 90 mg by mouth 2 (two) times daily.        Assessment: 78 y.o. male found to be in new onset afib with  RVR. CHADs score=3. First dose of coumadin 07/18/11. He was previously on LMWH 40 mg qday and he got at dose today at 1051 am.   New order to start heparin per pharmacy.  For DC-CV later today. Baseline INR 1.23. Now INR 1.2 after 1 dose of 5 mg coumadin. Home Brilinta 90 mg po BID resumed. Home aspirin NOT resumed 2nd pt now on coumadin.  Goal of Therapy:  INR 2-3 Heparin level 0.3-0.7   Plan:  1. Heparin 4000 units bolus (even though he just got LMWH 40 mg) 2. Heparin drip at 1000 units/hr = 14 U/kg/hr 3. 8 hr heparin level 4. Coumadin 5mg  po x 1 dose today 5. Daily PT/INR, HL, CBC 6. Pt on both prilosec and protonix at home.  I have ordered protonix. Please send pt home on only 1 PPI.  Thanks Len Childs T 07/19/2011 10:56 AM Pager: [phone]

## 2011-07-19 NOTE — Progress Notes (Signed)
Subjective: No new complaints  Objective: Vital signs in last 24 hours: Temp:  [98 F (36.7 C)-98.4 F (36.9 C)] 98.1 F (36.7 C) (03/11 0537) Pulse Rate:  [91-162] 96  (03/10 2200) Resp:  [18-20] 20  (03/11 0537) BP: (97-123)/(60-73) 121/67 mmHg (03/11 0537) SpO2:  [90 %-95 %] 92 % (03/11 0537) Weight:  [71.3 kg (157 lb 3 oz)] 71.3 kg (157 lb 3 oz) (03/11 0537) Weight change: -0.7 kg (-1 lb 8.7 oz) Last BM Date: 07/17/11  Intake/Output from previous day: 03/10 0701 - 03/11 0700 In: 120 [P.O.:120] Out: -  Intake/Output this shift:    Resp: dry crackles at bases Cardio: IR IR no murmur  Lab Results:  [facility] 07/19/11 0450 07/18/11 0515  WBC 4.9 6.5  HGB 13.1 12.0*  HCT 38.6* 36.0*  PLT 150 146*   BMET  Basename 07/18/11 0515 07/17/11 0719  NA 138 141  K 3.5 4.2  CL 106 107  CO2 23 22  GLUCOSE 85 227*  BUN 27* 30*  CREATININE 1.23 1.15  CALCIUM 8.4 8.9    Studies/Results: Dg Chest Port 1 View  07/18/2011  *RADIOLOGY REPORT*  Clinical Data: Low oxygen saturation.  PORTABLE CHEST - 1 VIEW  Comparison: 07/16/2011.  Findings: The cardiac silhouette, mediastinal and hilar contours are stable.  Stable emphysematous changes and areas of pulmonary fibrosis.  No definite superimposed infiltrate or effusion.  IMPRESSION: Chronic emphysematous changes and areas of pulmonary fibrosis without definite acute overlying pulmonary process.  Original Report Authenticated By: P. Loralie Champagne, M.D.    Medications: I have reviewed the patient's current medications.  Assessment/Plan: Principal Problem:  Atrial Fibrillation  DC cardioversion today, per cardiology. Coumadin started, stay on aspirin and brillanta? Active Problems:  Diabetes Mellitus   SSI  CAD  outpt meds   Pulmonary  No sign of active infection, discontinue antibiotics  Hope to discharge in am   LOS: 3 days   Craigory Toste JOSEPH 07/19/2011, 7:04 AM

## 2011-07-19 NOTE — Progress Notes (Signed)
ANTICOAGULATION CONSULT NOTE - Follow Up Consult  Pharmacy Consult for Heparin Indication: atrial fibrillation  Allergies  Allergen Reactions  . Metformin And Related Nausea And Vomiting    Patient Measurements: Height: 5\' 9"  (175.3 cm) Weight: 157 lb 3 oz (71.3 kg) IBW/kg (Calculated) : 70.7    Vital Signs: Temp: 97.9 F (36.6 C) (03/11 2148) Temp src: Oral (03/11 2148) BP: 91/62 mmHg (03/11 2148) Pulse Rate: 104  (03/11 2148)  Labs:  Alvira Philips 07/19/11 2050 07/19/11 0450 07/18/11 1610 07/18/11 0515 07/17/11 1844 07/17/11 0934 07/17/11 0719 07/17/11 0203  HGB -- 13.1 -- 12.0* -- -- -- --  HCT -- 38.6* -- 36.0* -- -- 37.0* --  PLT -- 150 -- 146* -- -- 147* --  APTT -- -- -- -- -- -- -- --  LABPROT -- 15.5* 15.8* -- -- -- -- --  INR -- 1.20 1.23 -- -- -- -- --  HEPARINUNFRC 0.64 -- -- -- -- -- -- --  CREATININE -- 1.33 -- 1.23 -- -- 1.15 --  CKTOTAL -- -- -- -- 112 106 -- 86  CKMB -- -- -- -- 3.0 3.2 -- 2.5  TROPONINI -- -- -- -- <0.30 <0.30 -- <0.30   Estimated Creatinine Clearance: 39.9 ml/min (by C-G formula based on Cr of 1.33).   Medications:  Infusions:    . sodium chloride 20 mL/hr at 07/17/11 0407  . sodium chloride    . heparin 1,000 Units/hr (07/19/11 1337)    Assessment:  78 y.o. male found to be in new onset afib with RVR  8 hour Heparin level therapeutic = 0.64   Goal of Therapy:   Heparin level 0.3-0.7 units/ml   Plan:   Continue Heparin at 1000 units/hr.  Daily Heparin levels, INR, CBC.  Lorilyn Laitinen, Elisha Headland, Pharm.D. 07/19/2011 9:56 PM

## 2011-07-19 NOTE — Progress Notes (Signed)
Cardiology Progress Note Patient Name: [name] Date of Encounter: 07/19/2011, 8:01 AM     Subjective  No overnight events. Patient feels well this morning without complaints of chest pain, palpitations, or shortness of breath.    Objective   Telemetry: A.fib/flutter 90s-120s, PVCs  Medications: . atorvastatin  40 mg Oral q1800  . enoxaparin  40 mg Subcutaneous Q24H  . furosemide  20 mg Intravenous Q12H  . insulin aspart  0-5 Units Subcutaneous QHS  . insulin aspart  0-9 Units Subcutaneous TID WC  . insulin aspart  3 Units Subcutaneous TID WC  . isosorbide mononitrate  30 mg Oral Daily  . linagliptin  5 mg Oral Daily  . lisinopril  5 mg Oral Daily  . metoprolol  5 mg Intravenous Once  . metoprolol succinate  50 mg Oral Daily  . potassium chloride  10 mEq Oral BID  . sodium chloride  3 mL Intravenous Q12H  . warfarin  5 mg Oral NOW  . DISCONTD: azithromycin  500 mg Intravenous Q24H  . DISCONTD: cefTRIAXone (ROCEPHIN)  IV  1 g Intravenous Q24H   . sodium chloride 20 mL/hr at 07/17/11 0407    Physical Exam: Temp:  [98 F (36.7 C)-98.4 F (36.9 C)] 98.1 F (36.7 C) (03/11 0537) Pulse Rate:  [91-126] 96  (03/10 2200) Resp:  [18-20] 20  (03/11 0537) BP: (97-121)/(62-68) 121/67 mmHg (03/11 0537) SpO2:  [90 %-94 %] 92 % (03/11 0537) Weight:  [157 lb 3 oz (71.3 kg)] 157 lb 3 oz (71.3 kg) (03/11 0537)  General: Pleasant elderly white male, in no acute distress. Head: Normocephalic, atraumatic, sclera non-icteric, nares are without discharge.  Neck: Supple. Negative for carotid bruits or JVD Lungs: Clear bilaterally to auscultation without wheezes, rales, or rhonchi. Breathing is unlabored. Heart: Irregularly irregular S1 S2 without murmurs, rubs, or gallops.  Abdomen: Soft, non-tender, non-distended with normoactive bowel sounds. No rebound/guarding. No obvious abdominal masses. Msk:  Strength and tone appear normal for age. Extremities: No edema. No clubbing  or cyanosis. Distal pedal pulses are intact and equal bilaterally. Neuro: Alert and oriented X 3. Moves all extremities spontaneously. Psych:  Responds to questions appropriately with a normal affect.   Intake/Output Summary (Last 24 hours) at 07/19/11 0801 Last data filed at 07/18/11 1700  Gross per 24 hour  Intake    120 ml  Output      0 ml  Net    120 ml    Labs:  [facility] 07/19/11 0450 07/18/11 0515  NA 140 138  K 3.9 3.5  CL 104 106  CO2 22 23  GLUCOSE 117* 85  BUN 30* 27*  CREATININE 1.33 1.23  CALCIUM 8.6 8.4   Basename 07/18/11 0515  AST 24  ALT 15  ALKPHOS 42  BILITOT 0.4  PROT 6.3  ALBUMIN 3.2*   Basename 07/19/11 0450 07/18/11 0515 07/16/11 2233  WBC 4.9 6.5 --  NEUTROABS -- -- 8.8*  HGB 13.1 12.0* --  HCT 38.6* 36.0* --  MCV 99.7 100.6* --  PLT 150 146* --   Basename 07/17/11 1844 07/17/11 0934 07/17/11 0203 07/16/11 2233  CKTOTAL 112 106 86 102  CKMB 3.0 3.2 2.5 2.5  TROPONINI <0.30 <0.30 <0.30 <0.30   Basename 07/17/11 0029  DDIMER 0.67*   Basename 07/17/11 0204  TSH 1.704     07/19/2011 04:50  Prothrombin Time 15.5 (H)  INR 1.20     07/16/2011 23:50  Pro B Natriuretic  peptide (BNP) 360.6    Radiology/Studies:   07/17/11 - 2D Echocardiogram Study Conclusions: - Left ventricle: The cavity size was normal. Wall thickness was normal. Systolic function was moderately reduced. The estimated ejection fraction was in the range of 35% to 40%. Regional wall motion abnormalities cannot be excluded. The study is not technically sufficient to allow evaluation of LV diastolic function. - Aortic valve: There was mild to moderate stenosis. Valve area: 1.38cm^2(VTI). Valve area: 1.52cm^2 (Vmax).   07/18/2011  - CXR  Findings: The cardiac silhouette, mediastinal and hilar contours are stable.  Stable emphysematous changes and areas of pulmonary fibrosis.  No definite superimposed infiltrate or effusion.  IMPRESSION: Chronic emphysematous changes and areas  of pulmonary fibrosis without definite acute overlying pulmonary process.      Assessment and Plan  78 y.o. male w/ PMHx significant for HTN, Hyperlipidemia, and CAD s/p LAD stent who presented to [facility] on 07/17/11 with complaints of sob and chest pain.   1. Atrial Fib/Flutter w/ RVR: No documented history prior to this admission although the pt states he has had an "irregular rhythm" in the past and there was question in cardiology note yesterday that PAF could be contributing to his presentation. NSR on admission with a.fib/flutter noted on 07/18/11. Received IV lopressor yesterday followed by home dose of Toprol XL and placed on coumadin. CHADS2 score 4 (CHF, HTN, Age, DM). Remains in fib/flutter today with rates 90s-120s. INR 1.2. DCCV planned for today, but will hold off until evaluated with lexiscan myoview tomorrow as he will need anticoagulation post cardioversion. Change Toprol XL to Lopressor 25mg  Q6H for better rate control. DCCV possibly tomorrow if myoview normal and he does not spontaneously convert. Change Lovenox to heparin drip.  2. CAD: Stent to LAD 2012, currently no anginal symptoms. CEs negative. ECG w/ mild ST depression on 07/18/11. EF 35-40% this admission without documented h/o low EF in the past. This may be 2/2 afib/flutter, but will need ischemic eval this admission. Was recently seen as outpatient by Dr. Elease Hashimoto and initiated on Imdur for cp with plans for outpt stress myoview tomorrow (07/20/11). Will arrange for inpatient lexiscan myoview tomorrow. Cont ACEI, statin, and Imdur. Uptitrate BB as above. He is on lifelong Brilinta, but I don't see it on his inpatient med list. Unsure how many doses he missed. Will restart this now. Would not restart home ASA as long as he is on on coumadin and Brilinta.  3. Cardiomyopathy: Presented with sob. proBNP only 360, no CHF on CXR, and no documented volume overload on exams. EF 35-40% by echo on 07/17/11. I don't see documented  history of systolic CHF and cath in '12 reports well-preserved left ventricular systolic function. May be 2/2 to a.fib/ flutter, but needs ischemic eval as above. DDimer mildly elevated at 0.67. No recent immobilization, surgeries, or extended travel, but does have history of cancer. Denies calf pain or swelling. None on exam. Check LE dopplers to r/o DVT. Currently being diuresed with 20mg  IV Lasix BID. Down 8 lbs since admission, although net + 23ml on I/Os. Euvolemic on exam. Stop Lasix. BB & ACEI as above    Signed, HOPE, JESSICA PA-C  Patient seen/examined.   78 year old with weakness, chest pressure .  On admit was in NSR with PACs.  Now afib with RVR.  Echo with moderate LV dysfunction.  Today with movement he became weak, HR increased to 150s.  He developed chest pressure.  Better when lying in bed.  He does not sense palpitations. Currently lying denies SOB.  Lungs: CTA  Cardiac irreg irreg.  No S3 or murmrus.  Ext:  No edemai.  Impression.  Initally I felt it was important to rule out ischemia prior to cardioversion.  But today symptoms seemd to be closely tied to high heart rates.  Rates would be difficult to control medically.  I would therefore recomm no myoview.  Proceed with cardioversion in AM.  Will need to follow echo over time to watch for improvement.  On heparin.  Beginning coumadin.

## 2011-07-20 ENCOUNTER — Inpatient Hospital Stay (HOSPITAL_COMMUNITY): Payer: Medicare Other

## 2011-07-20 ENCOUNTER — Encounter (HOSPITAL_COMMUNITY): Payer: Self-pay | Admitting: Anesthesiology

## 2011-07-20 ENCOUNTER — Encounter (HOSPITAL_COMMUNITY)
Admission: EM | Disposition: A | Discharge status: Home / Self Care | Payer: Self-pay | Source: Home / Self Care | Attending: Internal Medicine

## 2011-07-20 ENCOUNTER — Encounter (HOSPITAL_COMMUNITY): Payer: Self-pay | Admitting: *Deleted

## 2011-07-20 ENCOUNTER — Other Ambulatory Visit (HOSPITAL_COMMUNITY): Payer: Medicare Other

## 2011-07-20 DIAGNOSIS — I480 Paroxysmal atrial fibrillation: Secondary | ICD-10-CM

## 2011-07-20 DIAGNOSIS — I429 Cardiomyopathy, unspecified: Secondary | ICD-10-CM

## 2011-07-20 DIAGNOSIS — I4891 Unspecified atrial fibrillation: Principal | ICD-10-CM

## 2011-07-20 LAB — BASIC METABOLIC PANEL
BUN: 29 mg/dL — ABNORMAL HIGH (ref 6–23)
CO2: 25 mEq/L (ref 19–32)
Calcium: 9.2 mg/dL (ref 8.4–10.5)
Creatinine, Ser: 1.2 mg/dL (ref 0.50–1.35)
Glucose, Bld: 115 mg/dL — ABNORMAL HIGH (ref 70–99)

## 2011-07-20 LAB — CBC
HCT: 38.2 % — ABNORMAL LOW (ref 39.0–52.0)
Hemoglobin: 13.1 g/dL (ref 13.0–17.0)
MCH: 34.5 pg — ABNORMAL HIGH (ref 26.0–34.0)
MCH: 33.3 pg (ref 26.0–34.0)
MCHC: 35.3 g/dL (ref 30.0–36.0)
MCV: 97.7 fL (ref 78.0–100.0)
MCV: 98.5 fL (ref 78.0–100.0)
RBC: 3.93 MIL/uL — ABNORMAL LOW (ref 4.22–5.81)
RDW: 13.3 % (ref 11.5–15.5)

## 2011-07-20 LAB — GLUCOSE, CAPILLARY
Glucose-Capillary: 131 mg/dL — ABNORMAL HIGH (ref 70–99)
Glucose-Capillary: 105 mg/dL — ABNORMAL HIGH (ref 70–99)
Glucose-Capillary: 121 mg/dL — ABNORMAL HIGH (ref 70–99)

## 2011-07-20 LAB — HEPARIN LEVEL (UNFRACTIONATED): Heparin Unfractionated: 0.46 IU/mL (ref 0.30–0.70)

## 2011-07-20 SURGERY — Surgical Case

## 2011-07-20 MED ORDER — SODIUM CHLORIDE 0.9 % IJ SOLN: 3.0000 mL | INTRAMUSCULAR | Status: DC | PRN

## 2011-07-20 MED ORDER — MIDAZOLAM HCL 10 MG/2ML IJ SOLN
INTRAMUSCULAR | Status: AC
  Filled 2011-07-20: qty 4

## 2011-07-20 MED ORDER — FENTANYL CITRATE 0.05 MG/ML IJ SOLN: 250.0000 ug | Freq: Once | INTRAMUSCULAR | Status: DC

## 2011-07-20 MED ORDER — SODIUM CHLORIDE 0.9 % IJ SOLN
3.0000 mL | Freq: Two times a day (BID) | INTRAMUSCULAR | Status: DC
  Administered 2011-07-20 – 2011-07-21 (×3): 3 mL via INTRAVENOUS

## 2011-07-20 MED ORDER — SODIUM CHLORIDE 0.9 % IJ SOLN: 3.0000 mL | Freq: Two times a day (BID) | INTRAMUSCULAR | Status: DC

## 2011-07-20 MED ORDER — SODIUM CHLORIDE 0.45 % IV SOLN: INTRAVENOUS | Status: DC

## 2011-07-20 MED ORDER — FLUMAZENIL 0.5 MG/5ML IV SOLN
INTRAVENOUS | Status: DC | PRN
  Administered 2011-07-20: .4 mg via INTRAVENOUS

## 2011-07-20 MED ORDER — METOPROLOL SUCCINATE ER 50 MG PO TB24
50.0000 mg | ORAL_TABLET | Freq: Two times a day (BID) | ORAL | Status: DC
  Administered 2011-07-20 (×2): 50 mg via ORAL
  Filled 2011-07-20 (×5): qty 1

## 2011-07-20 MED ORDER — WARFARIN SODIUM 5 MG PO TABS
5.0000 mg | ORAL_TABLET | Freq: Once | ORAL | Status: AC
  Administered 2011-07-20: 5 mg via ORAL
  Filled 2011-07-20: qty 1

## 2011-07-20 MED ORDER — MIDAZOLAM HCL 10 MG/2ML IJ SOLN: 10.0000 mg | Freq: Once | INTRAMUSCULAR | Status: DC

## 2011-07-20 MED ORDER — ALBUTEROL SULFATE (5 MG/ML) 0.5% IN NEBU: 2.5000 mg | INHALATION_SOLUTION | Freq: Four times a day (QID) | RESPIRATORY_TRACT | Status: DC | PRN

## 2011-07-20 MED ORDER — SODIUM CHLORIDE 0.9 % IV SOLN: 250.0000 mL | INTRAVENOUS | Status: DC | PRN

## 2011-07-20 MED ORDER — FLUMAZENIL 0.5 MG/5ML IV SOLN
INTRAVENOUS | Status: AC
  Filled 2011-07-20: qty 5

## 2011-07-20 MED ORDER — BUTAMBEN-TETRACAINE-BENZOCAINE 2-2-14 % EX AERO
INHALATION_SPRAY | CUTANEOUS | Status: DC | PRN
  Administered 2011-07-20: 2 via TOPICAL

## 2011-07-20 MED ORDER — ALBUTEROL SULFATE (5 MG/ML) 0.5% IN NEBU
INHALATION_SOLUTION | RESPIRATORY_TRACT | Status: AC
  Filled 2011-07-20: qty 0.5

## 2011-07-20 MED ORDER — ALBUTEROL SULFATE (5 MG/ML) 0.5% IN NEBU
2.5000 mg | INHALATION_SOLUTION | Freq: Once | RESPIRATORY_TRACT | Status: AC
  Administered 2011-07-20: 2.5 mg via RESPIRATORY_TRACT

## 2011-07-20 MED ORDER — FENTANYL CITRATE 0.05 MG/ML IJ SOLN
INTRAMUSCULAR | Status: AC
  Filled 2011-07-20: qty 4

## 2011-07-20 MED ORDER — MIDAZOLAM HCL 10 MG/2ML IJ SOLN
INTRAMUSCULAR | Status: DC | PRN
  Administered 2011-07-20 (×4): 2 mg via INTRAVENOUS

## 2011-07-20 MED ORDER — FENTANYL CITRATE 0.05 MG/ML IJ SOLN
INTRAMUSCULAR | Status: DC | PRN
  Administered 2011-07-20 (×4): 25 ug via INTRAVENOUS

## 2011-07-20 MED ORDER — HYDROCORTISONE 1 % EX CREA: 1.0000 "application " | TOPICAL_CREAM | Freq: Three times a day (TID) | CUTANEOUS | Status: DC | PRN

## 2011-07-20 MED ORDER — SODIUM CHLORIDE 0.9 % IV SOLN
250.0000 mL | INTRAVENOUS | Status: DC
  Administered 2011-07-20: 1 mL via INTRAVENOUS

## 2011-07-20 MED ORDER — BENZOCAINE 20 % MT SOLN: 1.0000 "application " | OROMUCOSAL | Status: DC | PRN

## 2011-07-20 MED FILL — Insulin Aspart Inj 100 Unit/ML: SUBCUTANEOUS | Qty: 0.2 | Status: AC

## 2011-07-20 NOTE — Progress Notes (Signed)
ANTICOAGULATION CONSULT NOTE - Follow Up Consult  Pharmacy Consult for Heparin and coumadin Indication: atrial fibrillation  Allergies  Allergen Reactions  . Metformin And Related Nausea And Vomiting    Patient Measurements: Height: 5\' 9"  (175.3 cm) Weight: 157 lb 3 oz (71.3 kg) IBW/kg (Calculated) : 70.7    Vital Signs: Temp: 97.7 F (36.5 C) (03/12 0458) Temp src: Oral (03/12 0458) BP: 112/74 mmHg (03/12 0458) Pulse Rate: 105  (03/12 0458)  Labs:  Alvira Philips 07/20/11 0510 07/19/11 2050 07/19/11 0450 07/18/11 0942 07/18/11 0515 07/17/11 1844  HGB 13.5 -- 13.1 -- -- --  HCT 38.2* -- 38.6* -- 36.0* --  PLT 186 -- 150 -- 146* --  APTT -- -- -- -- -- --  LABPROT 17.5* -- 15.5* 15.8* -- --  INR 1.41 -- 1.20 1.23 -- --  HEPARINUNFRC 0.46 0.64 -- -- -- --  CREATININE -- -- 1.33 -- 1.23 --  CKTOTAL -- -- -- -- -- 112  CKMB -- -- -- -- -- 3.0  TROPONINI -- -- -- -- -- <0.30   Estimated Creatinine Clearance: 39.9 ml/min (by C-G formula based on Cr of 1.33).   Assessment:  78 y.o. male found to be in new onset afib with RVR. Heparin level 0.46 = therapeutic on 1000 units/hr. INR 1.41 after 2 dose of coumadin 5 mg.  For DCCV today after TEE.  Goal of Therapy:   Heparin level 0.3-0.7 units/ml  INR 2-3   Plan:   Continue Heparin at 1000 units/hr.  Repeat coumadin 5 mg x 1 dose  Daily Heparin levels, INR, CBC.  Herby Abraham, Pharm.D. 07/20/2011 9:40 AM

## 2011-07-20 NOTE — Progress Notes (Signed)
Subjective: Feels better today.  Objective: Vital signs in last 24 hours: Temp:  [97.7 F (36.5 C)-97.9 F (36.6 C)] 97.7 F (36.5 C) (03/12 0458) Pulse Rate:  [83-105] 105  (03/12 0458) Resp:  [20] 20  (03/12 0458) BP: (91-112)/(62-74) 112/74 mmHg (03/12 0458) SpO2:  [96 %-97 %] 97 % (03/12 0458) Weight change:  Last BM Date: 07/18/11  Intake/Output from previous day: 03/11 0701 - 03/12 0700 In: 240 [P.O.:240] Out: -  Intake/Output this shift:    General appearance: alert and cooperative Resp: few crackles at bases Cardio: irregularly irregular rhythm  Lab Results:  Basename 07/20/11 0510 07/19/11 0450  WBC 7.4 4.9  HGB 13.5 13.1  HCT 38.2* 38.6*  PLT 186 150   BMET  Basename 07/19/11 0450 07/18/11 0515  NA 140 138  K 3.9 3.5  CL 104 106  CO2 22 23  GLUCOSE 117* 85  BUN 30* 27*  CREATININE 1.33 1.23  CALCIUM 8.6 8.4    Studies/Results: Dg Chest Port 1 View  07/18/2011  *RADIOLOGY REPORT*  Clinical Data: Low oxygen saturation.  PORTABLE CHEST - 1 VIEW  Comparison: 07/16/2011.  Findings: The cardiac silhouette, mediastinal and hilar contours are stable.  Stable emphysematous changes and areas of pulmonary fibrosis.  No definite superimposed infiltrate or effusion.  IMPRESSION: Chronic emphysematous changes and areas of pulmonary fibrosis without definite acute overlying pulmonary process.  Original Report Authenticated By: P. Loralie Champagne, M.D.    Medications: I have reviewed the patient's current medications.  Assessment/Plan: Principal Problem:  *Atrial fibrillation  DC cardioversion today, on warfarin, med management by cardiology Active Problems: CAD stable on medical therapy  LV dysfunction, noted on echo, ACEI started, on beta blocker  Hypoxemia CXR shows chronic changes, former smoker quit in 1980s, suspect he has some COPD,    Will workup at outpatient, if O2 dat less than 90% today will need home O2.  Diabetes mellitus reasonable control, SSI  and tradjenta, glimeperide on hold   LOS: 4 days   Kushal Saunders JOSEPH 07/20/2011, 7:33 AM

## 2011-07-20 NOTE — Brief Op Note (Signed)
See operative note  [name]  

## 2011-07-20 NOTE — Progress Notes (Signed)
Patient ID: [name], male   DOB: 1923/05/22, 78 y.o.   MRN: [id number]    SUBJECTIVE: Feels better this am.  HR in 100s atrial fibrillation still.       [name] atorvastatin  40 mg Oral q1800  . heparin  4,000 Units Intravenous Once  . insulin aspart  0-5 Units Subcutaneous QHS  . insulin aspart  0-9 Units Subcutaneous TID WC  . insulin aspart  3 Units Subcutaneous TID WC  . isosorbide mononitrate  30 mg Oral Daily  . linagliptin  5 mg Oral Daily  . lisinopril  5 mg Oral Daily  . metoprolol succinate  50 mg Oral BID  . pantoprazole  40 mg Oral Q1200  . regadenoson  0.4 mg Intravenous Once  . sodium chloride  3 mL Intravenous Q12H  . Ticagrelor  90 mg Oral BID  . warfarin  5 mg Oral Once  . Warfarin - Pharmacist Dosing Inpatient   Does not apply q1800  . DISCONTD: enoxaparin  40 mg Subcutaneous Q24H  . DISCONTD: metoprolol succinate  50 mg Oral Daily  . DISCONTD: metoprolol tartrate  25 mg Oral Q6H  . DISCONTD: sodium chloride  3 mL Intravenous Q12H  . DISCONTD: sodium chloride  3 mL Intravenous Q12H  . DISCONTD: sodium chloride  3 mL Intravenous Q12H  heparin gtt   Filed Vitals:   07/19/11 1413 07/19/11 2148 07/19/11 2313 07/20/11 0458  BP: 104/65 91/62 103/65 112/74  Pulse: 83 104  105  Temp: 97.8 F (36.6 C) 97.9 F (36.6 C)  97.7 F (36.5 C)  TempSrc: Oral Oral  Oral  Resp: 20 20  20   Height:      Weight:      SpO2: 96% 97%  97%    Intake/Output Summary (Last 24 hours) at 07/20/11 0814 Last data filed at 07/19/11 1230  Gross per 24 hour  Intake    240 ml  Output      0 ml  Net    240 ml    LABS: Basic Metabolic Panel:  Basename 07/19/11 0450 07/18/11 0515  NA 140 138  K 3.9 3.5  CL 104 106  CO2 22 23  GLUCOSE 117* 85  BUN 30* 27*  CREATININE 1.33 1.23  CALCIUM 8.6 8.4  MG -- --  PHOS -- --   Liver Function Tests:  Basename 07/18/11 0515  AST 24  ALT 15  ALKPHOS 42  BILITOT 0.4  PROT 6.3  ALBUMIN 3.2*   No results found for this  basename: LIPASE:2,AMYLASE:2 in the last 72 hours CBC:  Basename 07/20/11 0510 07/19/11 0450  WBC 7.4 4.9  NEUTROABS -- --  HGB 13.5 13.1  HCT 38.2* 38.6*  MCV 97.7 99.7  PLT 186 150   Cardiac Enzymes:  Basename 07/17/11 1844 07/17/11 0934  CKTOTAL 112 106  CKMB 3.0 3.2  CKMBINDEX -- --  TROPONINI <0.30 <0.30   BNP: No components found with this basename: POCBNP:3 D-Dimer: No results found for this basename: DDIMER:2 in the last 72 hours Hemoglobin A1C: No results found for this basename: HGBA1C in the last 72 hours Fasting Lipid Panel: No results found for this basename: CHOL,HDL,LDLCALC,TRIG,CHOLHDL,LDLDIRECT in the last 72 hours Thyroid Function Tests: No results found for this basename: TSH,T4TOTAL,FREET3,T3FREE,THYROIDAB in the last 72 hours Anemia Panel: No results found for this basename: VITAMINB12,FOLATE,FERRITIN,TIBC,IRON,RETICCTPCT in the last 72 hours  RADIOLOGY: Dg Chest 2 View  07/16/2011  *RADIOLOGY REPORT*  Clinical Data: Left chest pressure.  CHEST -  2 VIEW  Comparison: 02/05/2011.  Findings: The cardiac silhouette remains borderline enlarged. Mildly progressive diffuse prominence of the interstitial markings. No pleural fluid.  Thoracic spine degenerative changes. Cholecystectomy clips.  IMPRESSION: Progressive interstitial lung disease.  Original Report Authenticated By: Darrol Angel, M.D.   Dg Chest Port 1 View  07/18/2011  *RADIOLOGY REPORT*  Clinical Data: Low oxygen saturation.  PORTABLE CHEST - 1 VIEW  Comparison: 07/16/2011.  Findings: The cardiac silhouette, mediastinal and hilar contours are stable.  Stable emphysematous changes and areas of pulmonary fibrosis.  No definite superimposed infiltrate or effusion.  IMPRESSION: Chronic emphysematous changes and areas of pulmonary fibrosis without definite acute overlying pulmonary process.  Original Report Authenticated By: P. Loralie Champagne, M.D.    PHYSICAL EXAM General: NAD Neck: No JVD, no  thyromegaly or thyroid nodule.  Lungs: Clear to auscultation bilaterally with normal respiratory effort. CV: Nondisplaced PMI.  Heart mildly tachy, irregular S1/S2, no S3/S4, no murmur.  No peripheral edema.  No carotid bruit.  Normal pedal pulses.  Abdomen: Soft, nontender, no hepatosplenomegaly, no distention.  Neurologic: Alert and oriented x 3.  Psych: Normal affect. Extremities: No clubbing or cyanosis.   TELEMETRY: Reviewed telemetry pt in atrial fibrillation, HR in 100s  ASSESSMENT AND PLAN:  78 yo with history of CAD s/p DES to LAD last year was admitted with nausea/vomiting/GI symptoms and was noted to develop atrial fibrillation with RVR in the hospital.  He has been dyspneic with the atrial fibrillation.   1. Atrial fibrillation: Plan for restoration of NSR by DCCV as he has not converted on his own. His atrial fibrillation has now been present > 48 hours (went into afib early Sunday morning) so should have TEE prior to DCCV.  Will try to arrange for today.  Transition to Toprol XL today.  Will need to bridge heparin gtt to therapeutic coumadin since cardioverting.   2. Acute/chronic systolic CHF: EF 11-91% on echo.  He was initially diuresed on IV Lasix.  He looks near euvolemic now.  Transitioning to Toprol XL as above.  Continue current dose of lisinopril.  Will need to decide on need for po Lasix before discharge.  3.  CAD: Stable, normal cardiac enzymes.  Noted plan for outpatient myoview.  He is off aspirin now but is still on Brilinta.  Noted plan per Dr. Elease Hashimoto for long-term Brilinta (had DES to LAD last March per notes).  Will need to discuss with Nahser about stopping Brilinta 1 year after PCI as patient is going to also be on coumadin.   Marca Ancona 07/20/2011 8:23 AM

## 2011-07-20 NOTE — Interval H&P Note (Signed)
History and Physical Interval Note:  07/20/2011 12:28 PM  [name]  has presented today for surgery, with the diagnosis of atrial fibrillation  The various methods of treatment have been discussed with the patient and family. After consideration of risks, benefits and other options for treatment, the patient has consented to  Procedure(s) (LRB): TRANSESOPHAGEAL ECHOCARDIOGRAM (TEE) (N/A) CARDIOVERSION (N/A) as a surgical intervention .  The patients' history has been reviewed, patient examined, no change in status, stable for surgery.  I have reviewed the patients' chart and labs.  Questions were answered to the patient's satisfaction.     Charlton Haws

## 2011-07-20 NOTE — Op Note (Signed)
Unable to pass TEE probe Sedated with 8mg  Versed 100 ug Fentanyl .4mg  of Romazicon  Had Dr Rhea Belton from GI try and he was also unable to pass probe  Patient has some bronchospasm requiring an Albuterol Rx  End of procedure patient stable with sat 97% on 2L BP 11/70 And afib rate 110.  Impression: Unable to do TEE.  Patient will have to be rate controlled and anticoagulated for 3 weeks Airway fine in recovery.

## 2011-07-21 DIAGNOSIS — I5023 Acute on chronic systolic (congestive) heart failure: Secondary | ICD-10-CM

## 2011-07-21 DIAGNOSIS — I2510 Atherosclerotic heart disease of native coronary artery without angina pectoris: Secondary | ICD-10-CM

## 2011-07-21 DIAGNOSIS — I5043 Acute on chronic combined systolic (congestive) and diastolic (congestive) heart failure: Secondary | ICD-10-CM

## 2011-07-21 LAB — PROTIME-INR
INR: 2.28 — ABNORMAL HIGH (ref 0.00–1.49)
Prothrombin Time: 25.5 seconds — ABNORMAL HIGH (ref 11.6–15.2)

## 2011-07-21 LAB — CBC
Hemoglobin: 12.1 g/dL — ABNORMAL LOW (ref 13.0–17.0)
MCH: 33.5 pg (ref 26.0–34.0)
MCHC: 34.6 g/dL (ref 30.0–36.0)
Platelets: 157 10*3/uL (ref 150–400)
RBC: 3.61 MIL/uL — ABNORMAL LOW (ref 4.22–5.81)

## 2011-07-21 LAB — BASIC METABOLIC PANEL
CO2: 23 mEq/L (ref 19–32)
Calcium: 9.1 mg/dL (ref 8.4–10.5)
GFR calc non Af Amer: 57 mL/min — ABNORMAL LOW (ref >90)
Potassium: 3.5 mEq/L (ref 3.5–5.1)
Sodium: 141 mEq/L (ref 135–145)

## 2011-07-21 LAB — GLUCOSE, CAPILLARY
Glucose-Capillary: 148 mg/dL — ABNORMAL HIGH (ref 70–99)
Glucose-Capillary: 151 mg/dL — ABNORMAL HIGH (ref 70–99)
Glucose-Capillary: 100 mg/dL — ABNORMAL HIGH (ref 70–99)
Glucose-Capillary: 125 mg/dL — ABNORMAL HIGH (ref 70–99)

## 2011-07-21 LAB — HEPARIN LEVEL (UNFRACTIONATED): Heparin Unfractionated: 0.27 IU/mL — ABNORMAL LOW (ref 0.30–0.70)

## 2011-07-21 MED ORDER — WARFARIN SODIUM 2 MG PO TABS
2.0000 mg | ORAL_TABLET | Freq: Once | ORAL | Status: AC
  Administered 2011-07-21: 2 mg via ORAL
  Filled 2011-07-21: qty 1

## 2011-07-21 MED ORDER — ASPIRIN EC 81 MG PO TBEC
81.0000 mg | DELAYED_RELEASE_TABLET | Freq: Every day | ORAL | Status: DC
  Administered 2011-07-21 – 2011-07-22 (×2): 81 mg via ORAL
  Filled 2011-07-21 (×2): qty 1

## 2011-07-21 MED ORDER — METOPROLOL SUCCINATE ER 25 MG PO TB24
75.0000 mg | ORAL_TABLET | Freq: Two times a day (BID) | ORAL | Status: DC
  Administered 2011-07-21: 75 mg via ORAL
  Filled 2011-07-21 (×2): qty 1

## 2011-07-21 MED ORDER — METOPROLOL SUCCINATE ER 100 MG PO TB24
100.0000 mg | ORAL_TABLET | Freq: Two times a day (BID) | ORAL | Status: DC
  Administered 2011-07-21 – 2011-07-22 (×2): 100 mg via ORAL
  Filled 2011-07-21 (×3): qty 1

## 2011-07-21 NOTE — Progress Notes (Signed)
Subjective: Some confusion last pm, oriented today, feels better, ready to go  Objective: Vital signs in last 24 hours: Temp:  [97.7 F (36.5 C)-98.7 F (37.1 C)] 98 F (36.7 C) (03/13 0551) Pulse Rate:  [99-109] 104  (03/13 0551) Resp:  [13-58] 20  (03/13 0551) BP: (61-147)/(37-89) 120/81 mmHg (03/13 0551) SpO2:  [94 %-99 %] 95 % (03/13 0551) Weight change:  Last BM Date: 07/18/11  Intake/Output from previous day: 03/12 0701 - 03/13 0700 In: 240 [P.O.:240] Out: 500 [Urine:500] Intake/Output this shift:    General appearance: alert Resp: clear to auscultation bilaterally Cardio: irregularly irregular rhythm Extremities: extremities normal, atraumatic, no cyanosis or edema  Lab Results:  Basename 07/20/11 1005 07/20/11 0510  WBC 5.8 7.4  HGB 13.1 13.5  HCT 38.7* 38.2*  PLT 170 186   BMET  Basename 07/20/11 1005 07/19/11 0450  NA 140 140  K 3.8 3.9  CL 107 104  CO2 25 22  GLUCOSE 115* 117*  BUN 29* 30*  CREATININE 1.20 1.33  CALCIUM 9.2 8.6    Studies/Results: Dg Chest Port 1 View  07/20/2011  *RADIOLOGY REPORT*  Clinical Data: Short of breath, confusion, weakness  PORTABLE CHEST - 1 VIEW  Comparison: Portable chest x-ray of 07/18/2011  Findings: Coarse and prominent interstitial markings diffusely are most consistent with pulmonary fibrosis.  No focal infiltrate or effusion is seen.  Cardiomegaly is stable.  The bones are osteopenic.  IMPRESSION: Probable stable pulmonary fibrosis.  No definite active process. Cardiomegaly.  Original Report Authenticated By: Juline Patch, M.D.    Medications: I have reviewed the patient's current medications.  Assessment/Plan: Principal Problem:  *Atrial fibrillation unable to to TEE yesterday, plan is rate control and anticoagulation for 3-4 weeks and then cardioversion if needed.  Per cardiology Active Problems:  Hypertension ok  Diabetes mellitus  Controlled on tradjenta and SSI  Hyperlipidemia  Coronary artery  disease per cardiology, asa discontinued, still on brillanta, beta blocker nitrate  Cardiomyopathy low dose ACEI  Hypoxemia, improved today, 96% off O2   LOS: 5 days   [facility]   [phone] 07/21/2011, 7:27 AM

## 2011-07-21 NOTE — Progress Notes (Signed)
ANTICOAGULATION CONSULT NOTE - Follow Up Consult  Pharmacy Consult for Heparin and coumadin Indication: atrial fibrillation  Allergies  Allergen Reactions  . Metformin And Related Nausea And Vomiting    Patient Measurements: Height: 5\' 9"  (175.3 cm) Weight: 157 lb 3 oz (71.3 kg) IBW/kg (Calculated) : 70.7    Vital Signs: Temp: 98 F (36.7 C) (03/13 0551) Temp src: Oral (03/13 0551) BP: 122/62 mmHg (03/13 0950) Pulse Rate: 110  (03/13 0950)  Labs:  Basename 07/21/11 0500 07/20/11 1005 07/20/11 0510 07/19/11 2050 07/19/11 0450  HGB 12.1* 13.1 -- -- --  HCT 35.0* 38.7* 38.2* -- --  PLT 157 170 186 -- --  APTT -- -- -- -- --  LABPROT 25.5* -- 17.5* -- 15.5*  INR 2.28* -- 1.41 -- 1.20  HEPARINUNFRC 0.27* -- 0.46 0.64 --  CREATININE 1.12 1.20 -- -- 1.33  CKTOTAL -- -- -- -- --  CKMB -- -- -- -- --  TROPONINI -- -- -- -- --   Estimated Creatinine Clearance: 47.3 ml/min (by C-G formula based on Cr of 1.12).   Assessment: 78 y.o. male found to be in new onset afib with RVR. Heparin level 0.27 on 1000 units/hr. INR 2.28 after 3 dose of coumadin 5 mg. Large 8 second jump in protime.  Heparin stopped 2nd INR tx.  Brillinta stopped as DES to LAD 08/05/10.  Asa 81mg  resumed. Goal of Therapy:  INR 2-3   Plan:   Heparin dc'd by MD  Coumadin 2 mg po x 1 dose today  Daily INR  Len Childs T, Pharm.D. 07/21/2011 10:06 AM

## 2011-07-21 NOTE — Progress Notes (Signed)
Cardiology Progress Note Patient Name: [name] Date of Encounter: 07/21/2011, 6:45 AM     Subjective  Per nursing he has had confusion the last two nights, ?sundowning. This morning he is having trouble expressing his emotions stating "my mind is not right". He admits to feeling somewhat depressed about being in the hospital. Otherwise he denies c/o chest pain, palpitations, or shortness of breath. Would like to go home as soon as possible.    Objective   Telemetry: A. fib 90s-100s (with some bursts of 120s-150s), frequent PVCs  Medications: . albuterol  2.5 mg Nebulization Once  . atorvastatin  40 mg Oral q1800  . heparin  4,000 Units Intravenous Once  . insulin aspart  0-5 Units Subcutaneous QHS  . insulin aspart  0-9 Units Subcutaneous TID WC  . insulin aspart  3 Units Subcutaneous TID WC  . isosorbide mononitrate  30 mg Oral Daily  . linagliptin  5 mg Oral Daily  . lisinopril  5 mg Oral Daily  . metoprolol succinate  50 mg Oral BID  . pantoprazole  40 mg Oral Q1200  . sodium chloride  3 mL Intravenous Q12H  . Ticagrelor  90 mg Oral BID  . warfarin  5 mg Oral ONCE-1800  . Warfarin - Pharmacist Dosing Inpatient   Does not apply q1800   . sodium chloride 20 mL/hr at 07/17/11 0407  . heparin 1,000 Units/hr (07/20/11 1032)    Physical Exam: Temp:  [97.7 F (36.5 C)-98.7 F (37.1 C)] 98 F (36.7 C) (03/13 0551) Pulse Rate:  [99-109] 104  (03/13 0551) Resp:  [13-58] 20  (03/13 0551) BP: (61-147)/(37-89) 120/81 mmHg (03/13 0551) SpO2:  [94 %-99 %] 95 % (03/13 0551)  General: Pleasant elderly white male, in no acute distress.  Head: Normocephalic, atraumatic, sclera non-icteric, nares are without discharge.  Neck: Supple. Negative for carotid bruits or JVD  Lungs: Clear bilaterally to auscultation without wheezes, rales, or rhonchi. Breathing is unlabored.  Heart: Irregularly irregular S1 S2 without murmurs, rubs, or gallops.  Abdomen: Soft, non-tender,  non-distended with normoactive bowel sounds. No rebound/guarding. No obvious abdominal masses.  Msk: Strength and tone appear normal for age.  Extremities: No edema. No clubbing or cyanosis. Distal pedal pulses are intact and equal bilaterally.  Neuro: Alert and oriented X 3. Moves all extremities spontaneously.  Psych: He is having trouble expressing his emotions stating "my mind is not right". He admits to feeling somewhat depressed about being in the hospital.    Intake/Output Summary (Last 24 hours) at 07/21/11 0645 Last data filed at 07/20/11 2046  Gross per 24 hour  Intake    240 ml  Output    500 ml  Net   -260 ml    Labs:  [facility] 07/20/11 1005 07/19/11 0450  NA 140 140  K 3.8 3.9  CL 107 104  CO2 25 22  GLUCOSE 115* 117*  BUN 29* 30*  CREATININE 1.20 1.33  CALCIUM 9.2 8.6   Basename 07/20/11 1005 07/20/11 0510  WBC 5.8 7.4  HGB 13.1 13.5  HCT 38.7* 38.2*  MCV 98.5 97.7  PLT 170 186   Basename  07/17/11 1844  07/17/11 0934  07/17/11 0203  07/16/11 2233   CKTOTAL  112  106  86  102   CKMB  3.0  3.2  2.5  2.5   TROPONINI  <0.30  <0.30  <0.30  <0.30    Basename  07/17/11 0029  DDIMER  0.67*    Basename  07/17/11 0204   TSH  1.704     07/20/2011 05:10  Prothrombin Time 17.5 (H)  INR 1.41    07/16/2011 23:50   Pro B Natriuretic peptide (BNP)  360.6      Radiology/Studies:   07/20/2011 - CXR Findings: Coarse and prominent interstitial markings diffusely are most consistent with pulmonary fibrosis.  No focal infiltrate or effusion is seen.  Cardiomegaly is stable.  The bones are osteopenic.  IMPRESSION: Probable stable pulmonary fibrosis.  No definite active process. Cardiomegaly.      Assessment and Plan  78 y.o. male w/ PMHx significant for CAD s/p DES to LAD last year who presented to [facility] on 07/17/11 with nausea/vomiting/GI symptoms and was noted to develop symptomatic atrial fibrillation with RVR in the hospital.   1. Atrial Fib/Flutter  w/ RVR:  NSR on admission with a.fib noted on 07/18/11. Placed on coumadin for CHADS2 score 4 (CHF, HTN, Age, DM). Remains in a. fib with rates 90s-100s (with some bursts of 120s-150s) on Toprol XL 50mg  BID. BP soft yesterday. TEE/DCCV planned for yesterday (07/20/11), however, unable to pass TEE probe. Will now rate control with plans for anticoagulation with coumadin for another 3wks then DCCV. Will need to ambulate today to observe rate response with activity.  2. CAD: Stent to LAD 2012, currently no anginal symptoms. CEs negative. Was recently seen as outpatient by Dr. Elease Hashimoto and initiated on Imdur for cp with plans for outpt stress myoview on 07/20/11, however, this was cancelled due to this admission. Will need to be rescheduled at discharge. Cont Brilinta, BB, ACEI, statin, and Imdur. Would not restart home ASA as long as he is on on coumadin and Brilinta.   3. Acute on Chronic Systolic CHF: EF 16-10% by echo on 07/17/11. Initially diuresed with IV Lasix, dc'd on 07/19/11. Cont BB and ACEI. Euvolemic on exam. I/Os Neg over 24hrs, Net +22ml since admission. Has not been weighed since 07/19/11 at which time he was down 8lbs from admission. Will need to decide on need for po Lasix before discharge.   Signed, HOPE, JESSICA PA-C  Patient seen and examined with [facility]. We discussed all aspects of the encounter. I agree with the assessment and plan as stated above.   Much improved overall but ventricular response still > 100 with mild exertion. Will increase Toprol to 75 bid and watch for a little bit.   He had a DES to LAD on 08/05/10. He is just about 1 year out. I think risks of keeping an 78 y/o man on Brillinta and Coumadin are much greater than the risk of stent thrombosis of clotting a DES after 50 weeks of DAPT. Will stop Brillinta. Continue coumadin and asa 81. Can stop heparin as INR > 2.  May be able to go home later today but need to watch HR and INR closely.   Time spent 35  minutes.   Karne Ozga,MD 9:04 AM

## 2011-07-22 MED ORDER — METOPROLOL SUCCINATE ER 100 MG PO TB24: 100.0000 mg | ORAL_TABLET | Freq: Two times a day (BID) | ORAL | Status: DC

## 2011-07-22 MED ORDER — WARFARIN SODIUM 2 MG PO TABS: 2.0000 mg | ORAL_TABLET | Freq: Every day | ORAL | Status: DC

## 2011-07-22 MED ORDER — LISINOPRIL 5 MG PO TABS: 5.0000 mg | ORAL_TABLET | Freq: Every day | ORAL | Status: DC

## 2011-07-22 MED ORDER — ISOSORBIDE MONONITRATE ER 30 MG PO TB24: 30.0000 mg | ORAL_TABLET | Freq: Every day | ORAL | Status: DC

## 2011-07-22 MED FILL — Insulin Aspart Inj 100 Unit/ML: SUBCUTANEOUS | Qty: 0.01 | Status: AC

## 2011-07-22 NOTE — Discharge Instructions (Signed)
Come To Dr. Jone Baseman office on Monday morning after 9:00 to get your blood thinner checked. If you has any problem over the weekend call me or my partner who was on call

## 2011-07-22 NOTE — Discharge Summary (Signed)
This is a 1 and a for a he is in a will is a BSO he does seem to a the is a and a year and is and C. and is this is thank you Physician Discharge Summary  Patient ID: [name] MRN: [id number] DOB/AGE: August 09, 1923 78 y.o.  Admit date: 07/16/2011 Discharge date: 07/22/2011  Admission Diagnoses: Chest pain Dyspnea Atrial fibrillation Coronary artery disease Diabetes mellitus type 2 Hypertension Hyperlipidemia Congestive heart failure   Discharge Diagnoses:  Principal Problem:  *Atrial fibrillation Active Problems:  Hypertension  Diabetes mellitus  Hyperlipidemia  Coronary artery disease  Cardiomyopathy  Acute on chronic systolic heart failure   Discharged Condition: good  Hospital Course: The patient was admitted on 07/17/2011 complaining of increasing shortness of breath and chest tightness of the 2 days prior to admission. His nitroglycerin dosing seemed to improve the pain. He also had a chronic cough productive of some light yellow sputum. His cardiac enzymes were negative. Chest x-ray showed mild progressive diffuse prominence of interstitial markings, no pleural fluid. Cardiac silhouette borderline. Soon after mission the patient developed atrial fibrillation and flutter with rapid ventricular response. His EKG showed mild ST depression. He was seen by the [facility] and was given IV metoprolol and put on a higher dose of Toprol. He was started on heparin and Coumadin. Cardiac markers remain negative. A transesophageal echocardiogram guided direct cardioversion was attempted but unable to pass probe into esophagus so procedure was not performed. The current plan is for 3-4 weeks of anticoagulation and then if needed dc cardioversion. The patient was continued on Coumadin and at discharge INR 2.46. The patient's low-dose aspirin was continued because of bleeding risk for brillanta was discontinued. His metoprolol dose was titrated to 100 mg twice a day with rates  running in the 90s at discharge. A 2-D echocardiogram was done and showed left ventricular ejection fraction 35-40% and mild to moderate aortic valve stenosis. The patient was started on a low dose of lisinopril. A low dose of nitrate  was also continued. The patient's diabetes was well-controlled her hospitalization on sliding scale insulins and tradjenta. He did have some hypoxemia on room air early in the hospitalization and was diuresis early on with oxygen saturations on room air in the 90s at discharge. His chest x-ray does suggest some chronic lung disease and he is a distant smoker and we may need to reevaluate his pulmonary function as an outpatient.  Consults: cardiology  Significant Diagnostic Studies: labs:  and cardiac graphics: Echocardiogram: As above   Treatments: cardiac meds: lisinopril (Zestril), metoprolol and furosemide and anticoagulation: ASA, heparin and warfarin  Discharge Exam: Blood pressure 126/73, pulse 96, temperature 98.2 F (36.8 C), temperature source Oral, resp. rate 88, height 5\' 9"  (1.753 m), weight 72.2 kg (159 lb 2.8 oz), SpO2 94.00%. Resp: clear to auscultation bilaterally Cardio: irregularly irregular rhythm  Disposition: 01-Home or Self Care  Discharge Orders    Future Appointments: Provider: Department: Dept Phone: Center:   09/09/2011 8:30 AM [facility] [phone] [facility]   09/15/2011 9:45 AM Vesta Mixer, MD [facility] [phone] None   06/16/2012 1:30 PM Krista Blue [facility] [phone] None   06/16/2012 2:00 PM Levert Feinstein, MD [facility] [phone] None     Medication List  As of 07/22/2011  7:58 AM   STOP taking these medications         pantoprazole 40 MG tablet      Ticagrelor  90 MG Tabs tablet         TAKE these medications         aspirin EC 81 MG tablet   Take 81 mg by mouth daily.      atorvastatin 40 MG tablet   Commonly known as: LIPITOR   Take 40 mg by mouth  daily.      glimepiride 4 MG tablet   Commonly known as: AMARYL   Take 4 mg by mouth daily before breakfast.      isosorbide mononitrate 30 MG 24 hr tablet   Commonly known as: IMDUR   Take 30 mg by mouth daily.      isosorbide mononitrate 30 MG 24 hr tablet   Commonly known as: IMDUR   Take 1 tablet (30 mg total) by mouth daily.      linagliptin 5 MG Tabs tablet   Commonly known as: TRADJENTA   Take 5 mg by mouth daily.      lisinopril 5 MG tablet   Commonly known as: PRINIVIL,ZESTRIL   Take 1 tablet (5 mg total) by mouth daily.      Melatonin 10 MG Caps   Take 10 mg by mouth daily. For Sleep      metoprolol succinate 100 MG 24 hr tablet   Commonly known as: TOPROL-XL   Take 1 tablet (100 mg total) by mouth 2 (two) times daily. Take with or immediately following a meal.      multivitamin tablet   Take 1 tablet by mouth daily.      NITROSTAT 0.4 MG SL tablet   Generic drug: nitroGLYCERIN   Take 1 tablet by mouth every 5 (five) minutes x 3 doses as needed. For chest pain.      omeprazole 20 MG capsule   Commonly known as: PRILOSEC   Take 20 mg by mouth daily.      PreserVision/Lutein Caps   Take 1 capsule by mouth daily.      SYSTANE OP   Apply to eye 4 (four) times daily.      VITAMIN D-3 PO   Take 1 tablet by mouth daily.      warfarin 2 MG tablet   Commonly known as: COUMADIN   Take 1 tablet (2 mg total) by mouth daily.           Follow-up Information    Follow up with Lillia Mountain, MD in 10 days.         SignedLillia Mountain 07/22/2011, 7:58 AM

## 2011-07-30 ENCOUNTER — Ambulatory Visit (INDEPENDENT_AMBULATORY_CARE_PROVIDER_SITE_OTHER): Payer: Medicare Other | Admitting: Nurse Practitioner

## 2011-07-30 ENCOUNTER — Encounter: Payer: Self-pay | Admitting: Nurse Practitioner

## 2011-07-30 VITALS — BP 118/70 | HR 78 | Ht 69.0 in | Wt 165.0 lb

## 2011-07-30 DIAGNOSIS — I428 Other cardiomyopathies: Secondary | ICD-10-CM

## 2011-07-30 DIAGNOSIS — I2510 Atherosclerotic heart disease of native coronary artery without angina pectoris: Secondary | ICD-10-CM

## 2011-07-30 DIAGNOSIS — I4891 Unspecified atrial fibrillation: Secondary | ICD-10-CM

## 2011-07-30 NOTE — Progress Notes (Signed)
This is about normal for him now.  No changes.  Thanks for seeing him

## 2011-07-30 NOTE — Assessment & Plan Note (Signed)
Patient remains in atrial fib. He is on coumadin. He is pretty asymptomatic at this time. Is probably committed to long term anticoagulation. Will have Dr. Elease Hashimoto review for further recommendations. He will continue to have his coumadin checked at Dr. Jone Baseman. If we do proceed with cardioversion, he will need at least 3 to 4 weeks with a therapeutic INR. He could be managed with rate control and anticoagulation as well. I will see him back in 2 weeks with Dr. Elease Hashimoto.

## 2011-07-30 NOTE — Assessment & Plan Note (Signed)
EF is 35 to 40%. He is asymptomatic at this time and looks compensated.

## 2011-07-30 NOTE — Progress Notes (Signed)
[name] Date of Birth: [birth year] Medical Record #[id number]  History of Present Illness: [name] is seen back today for a post hospital visit. He is seen for Dr. Elease Hashimoto. He is a very pleasant 78 year old male with known CAD with prior PCI to the LAD one year ago. His other problems include chronic PVCs and PACs, HTn, DM, HLD and systolic heart failure.  He comes in today. He is here alone. His last visit with Dr. Elease Hashimoto was at the end of February. He was complaining of some exertional dyspnea and chest tightness. A lexiscan was scheduled.  He then presented to the hospital with increasing shortness of breath and chest tightness. He was discharged from [facility] on the 14th of March. While admitted, he was noted to have atrial fib with RVR. His medicines were adjusted. Attempts at a TEE/CV were carried out, however, the procedure was aborted due to inability to pass the probe into the esophagus. An echo done during this last admission shows an EF of 35 to 40% with mild to moderate AS. He is now on ACE. Plan was for him to have outpatient cardioversion after 3 to 4 weeks of satisfactory anticoagulation. He is now on Coumadin. This is being monitored by Dr. Valentina Lucks. Does not sound like he is therapeutic. INR yesterday is reported to be 1.8.   He says he now feels good. He has had no more chest pain. He is not short of breath. Doesn't really seem to be aware of his atrial fib. Says he has "always been out of rhythm". His old EKGs however, show sinus with PACs and PVCs. Nevertheless, he now feels good. He is not lightheaded or dizzy. No swelling.   Current Outpatient Prescriptions on File Prior to Visit  Medication Sig Dispense Refill  . aspirin EC 81 MG tablet Take 81 mg by mouth daily.      [name] atorvastatin (LIPITOR) 40 MG tablet Take 40 mg by mouth daily.      . Cholecalciferol (VITAMIN D-3 PO) Take 1 tablet by mouth daily.       [name] glimepiride (AMARYL) 4 MG tablet Take 4 mg by mouth daily before  breakfast.        . isosorbide mononitrate (IMDUR) 30 MG 24 hr tablet Take 1 tablet (30 mg total) by mouth daily.  30 tablet  11  . linagliptin (TRADJENTA) 5 MG TABS tablet Take 5 mg by mouth daily.        [name] lisinopril (PRINIVIL,ZESTRIL) 5 MG tablet Take 1 tablet (5 mg total) by mouth daily.  30 tablet  11  . Melatonin 10 MG CAPS Take 10 mg by mouth daily. For Sleep      . Multiple Vitamin (MULTIVITAMIN) tablet Take 1 tablet by mouth daily.        . Multiple Vitamins-Minerals (PRESERVISION/LUTEIN) CAPS Take 1 capsule by mouth daily.     0  . NITROSTAT 0.4 MG SL tablet Take 1 tablet by mouth every 5 (five) minutes x 3 doses as needed. For chest pain.      [name] omeprazole (PRILOSEC) 20 MG capsule Take 20 mg by mouth daily.      [name] Glycol-Propyl Glycol (SYSTANE OP) Apply to eye 4 (four) times daily.        [name] warfarin (COUMADIN) 2 MG tablet Take 1 tablet (2 mg total) by mouth daily.  30 tablet  11    Allergies  Allergen Reactions  . Metformin And Related Nausea And Vomiting  Past Medical History  Diagnosis Date  . Coronary artery disease     s/p PCI to LAD 08/05/10  . Hypertension   . Diabetes mellitus   . Hyperlipidemia   . Barrett esophagus   . Diabetes mellitus   . Gallstone pancreatitis 2011  . Nephrolithiasis   . SBO (small bowel obstruction) 2005  . Colon cancer   . Macrocytic anemia 06/25/2011  . Atrial flutter with rapid ventricular response 07/18/11    failed TEE due to inability to pass probe into the esophagus    Past Surgical History  Procedure Date  . Cholecystectomy 2011  . Right colectomy   . Coronary angioplasty with stent placement 08/05/10    DES to the LAD  . Cardiac catheterization 08/03/10  . Cardiac catheterization 08/09/10    Stent patent  . Abdominal mass resection     Mass near mesentery  . Catracts     bileral removal both eyes    History  Smoking status  . Former Smoker  . Quit date: 05/10/1994  Smokeless tobacco  . Not on file     History  Alcohol Use No    Family History  Problem Relation Age of Onset  . Cancer Mother   . Ulcers Father     Review of Systems: The review of systems is per the HPI. He is not having any complaints today and has felt much better over the past week.  All other systems were reviewed and are negative.  Physical Exam: BP 118/70  Pulse 78  Ht 5\' 9"  (1.753 m)  Wt 165 lb (74.844 kg)  BMI 24.37 kg/m2 Patient is very pleasant and in no acute distress. Skin is warm and dry. Color is normal.  HEENT is unremarkable. Normocephalic/atraumatic. PERRL. Sclera are nonicteric. Neck is supple. No masses. No JVD. Lungs are clear. Cardiac exam shows an irregular rhythm. Rate is fairly well controlled. Soft outflow murmur noted. Abdomen is soft. Extremities are without edema. Gait and ROM are intact. No gross neurologic deficits noted.  LABORATORY DATA: EKG today shows atrial fib with R BBB. Rate is 94.   Assessment / Plan:

## 2011-07-30 NOTE — Assessment & Plan Note (Signed)
He has had no further chest tightness. He is not short of breath. I have not set up his Lexiscan. I think we can hold off for now. Will have Dr. Elease Hashimoto review as well. We will both see [name] back in 2 weeks for further discussion. Patient is agreeable to this plan and will call if any problems develop in the interim.

## 2011-07-30 NOTE — Patient Instructions (Addendum)
I will see you in 2 weeks with Dr. Elease Hashimoto. We will discuss at that visit if we will proceed on with stress testing and a cardioversion.  Call the [facility] office at [phone] if you have any questions, problems or concerns.

## 2011-08-06 ENCOUNTER — Other Ambulatory Visit: Payer: Self-pay | Admitting: Cardiology

## 2011-08-16 ENCOUNTER — Encounter: Payer: Self-pay | Admitting: Nurse Practitioner

## 2011-08-16 ENCOUNTER — Ambulatory Visit (INDEPENDENT_AMBULATORY_CARE_PROVIDER_SITE_OTHER): Payer: Medicare Other | Admitting: Nurse Practitioner

## 2011-08-16 VITALS — BP 110/68 | HR 90 | Ht 69.0 in | Wt 165.0 lb

## 2011-08-16 DIAGNOSIS — I10 Essential (primary) hypertension: Secondary | ICD-10-CM

## 2011-08-16 DIAGNOSIS — I4891 Unspecified atrial fibrillation: Secondary | ICD-10-CM

## 2011-08-16 DIAGNOSIS — I2510 Atherosclerotic heart disease of native coronary artery without angina pectoris: Secondary | ICD-10-CM

## 2011-08-16 DIAGNOSIS — I428 Other cardiomyopathies: Secondary | ICD-10-CM

## 2011-08-16 NOTE — Assessment & Plan Note (Signed)
He is now in chronic atrial fib. He is on anticoagulation. He is not symptomatic and says he feels good. I have reviewed with Dr. Elease Hashimoto. We will continue with his current management. No plans for cardioversion. Will have Dr. Elease Hashimoto to see him in June. Patient is agreeable to this plan and will call if any problems develop in the interim.

## 2011-08-16 NOTE — Assessment & Plan Note (Signed)
EF is 35 to 40%. He looks well compensated.

## 2011-08-16 NOTE — Assessment & Plan Note (Signed)
Blood pressure looks great. No change with his current regimen.

## 2011-08-16 NOTE — Progress Notes (Signed)
[name] Date of Birth: Jun 29, 1923 Medical Record #[id number]  History of Present Illness: [name] is seen today for a 2 week check. He is seen for Dr. Elease Hashimoto. He has known CAD with prior PCI to the LAD last year. Other issues include chronic PVC's and PACs, HTN, DM, HLD and systolic heart failure. He now has atrial fib and will be managed with rate control and anticoagulation. He had a failed attempt at TEE/CV last month due to inability to pass the probe into the esophagus. His EF is 35 to 40% with mild to moderate AS.  He comes in today. He continues to do well. Says he is feeling good. No chest pain. Not short of breath. He is planning on getting back into [place] walking program [place] the park now that the weather has improved. He is not really aware of his atrial fib. Dr. Valentina Lucks is checking his INR's. No problems with his coumadin reported.   Current Outpatient Prescriptions on File Prior to Visit  Medication Sig Dispense Refill  . aspirin EC 81 MG tablet Take 81 mg by mouth daily.      [name] atorvastatin (LIPITOR) 40 MG tablet Take 40 mg by mouth daily.      . Cholecalciferol (VITAMIN D-3 PO) Take 1 tablet by mouth daily.       [name] glimepiride (AMARYL) 4 MG tablet Take 4 mg by mouth daily before breakfast.        . isosorbide mononitrate (IMDUR) 30 MG 24 hr tablet Take 1 tablet (30 mg total) by mouth daily.  30 tablet  11  . linagliptin (TRADJENTA) 5 MG TABS tablet Take 5 mg by mouth daily.        [name] lisinopril (PRINIVIL,ZESTRIL) 5 MG tablet Take 1 tablet (5 mg total) by mouth daily.  30 tablet  11  . Melatonin 10 MG CAPS Take 10 mg by mouth daily. For Sleep      . metoprolol succinate (TOPROL-XL) 100 MG 24 hr tablet Take 100 mg by mouth daily. Take with or immediately following a meal.      . Multiple Vitamin (MULTIVITAMIN) tablet Take 1 tablet by mouth daily.        . Multiple Vitamins-Minerals (PRESERVISION/LUTEIN) CAPS Take 1 capsule by mouth daily.     0  . NITROSTAT 0.4 MG SL tablet  DISSOLVE ONE TABLET UNDER THE TONGUE AS NEEDED FOR CHEST PAIN OR TIGHTNESS  25 each  11  . omeprazole (PRILOSEC) 20 MG capsule Take 20 mg by mouth daily.      [name] Glycol-Propyl Glycol (SYSTANE OP) Apply to eye 4 (four) times daily.        [name] warfarin (COUMADIN) 2 MG tablet Take 1 tablet (2 mg total) by mouth daily.  30 tablet  11    Allergies  Allergen Reactions  . Metformin And Related Nausea And Vomiting    Past Medical History  Diagnosis Date  . Coronary artery disease     s/p PCI to LAD 08/05/10  . Hypertension   . Diabetes mellitus   . Hyperlipidemia   . Barrett esophagus   . Diabetes mellitus   . Gallstone pancreatitis 2011  . Nephrolithiasis   . SBO (small bowel obstruction) 2005  . Colon cancer   . Macrocytic anemia 06/25/2011  . Atrial flutter with rapid ventricular response 07/18/11    failed TEE due to inability to pass probe into the esophagus in March 2013; now managed with anticoagulation and rate control  .  Chronic anticoagulation     Past Surgical History  Procedure Date  . Cholecystectomy 2011  . Right colectomy   . Coronary angioplasty with stent placement 08/05/10    DES to the LAD  . Cardiac catheterization 08/03/10  . Cardiac catheterization 08/09/10    Stent patent  . Abdominal mass resection     Mass near mesentery  . Catracts     bileral removal both eyes    History  Smoking status  . Former Smoker  . Quit date: 05/10/1994  Smokeless tobacco  . Not on file    History  Alcohol Use No    Family History  Problem Relation Age of Onset  . Cancer Mother   . Ulcers Father     Review of Systems: The review of systems is per the HPI.  All other systems were reviewed and are negative.  Physical Exam: BP 110/68  Pulse 90  Ht 5\' 9"  (1.753 m)  Wt 165 lb (74.844 kg)  BMI 24.37 kg/m2 Patient is very pleasant and in no acute distress. Looks younger than his stated age. Skin is warm and dry. Color is normal.  HEENT is unremarkable.  Normocephalic/atraumatic. PERRL. Sclera are nonicteric. Neck is supple. No masses. No JVD. Lungs are clear. Cardiac exam shows an irregular rhythm. His rate is well controlled. Abdomen is soft. Extremities are without edema. Gait and ROM are intact. No gross neurologic deficits noted.   LABORATORY DATA:   Assessment / Plan:

## 2011-08-16 NOTE — Patient Instructions (Signed)
Stay on your current medicines  We will see you back in June   Get back into walking program at the park  Call the [facility] office at [phone] if you have any questions, problems or concerns.

## 2011-08-16 NOTE — Assessment & Plan Note (Signed)
Patient is currently without symptoms. Will hold off on further testing at this time. He is given the ok to get back into his walking program.

## 2011-09-09 ENCOUNTER — Other Ambulatory Visit: Payer: Medicare Other

## 2011-09-15 ENCOUNTER — Ambulatory Visit: Payer: Medicare Other | Admitting: Cardiovascular Disease

## 2011-09-16 ENCOUNTER — Telehealth: Payer: Self-pay | Admitting: Cardiovascular Disease

## 2011-09-16 ENCOUNTER — Telehealth: Payer: Self-pay | Admitting: *Deleted

## 2011-09-16 NOTE — Telephone Encounter (Signed)
meds reviewed and we coincide with list from dr Valentina Lucks, pt agreed with list review.

## 2011-09-16 NOTE — Telephone Encounter (Signed)
Walk-in Pt form "Pt needs a call back about  Meds. Sent to Jodette/Nahser    09/16/11 TK

## 2011-10-14 ENCOUNTER — Ambulatory Visit (INDEPENDENT_AMBULATORY_CARE_PROVIDER_SITE_OTHER): Payer: Medicare Other | Admitting: Cardiovascular Disease

## 2011-10-14 ENCOUNTER — Encounter: Payer: Self-pay | Admitting: Cardiovascular Disease

## 2011-10-14 ENCOUNTER — Other Ambulatory Visit (INDEPENDENT_AMBULATORY_CARE_PROVIDER_SITE_OTHER): Payer: Medicare Other

## 2011-10-14 VITALS — BP 136/70 | HR 76 | Ht 69.0 in | Wt 168.1 lb

## 2011-10-14 DIAGNOSIS — I4891 Unspecified atrial fibrillation: Secondary | ICD-10-CM

## 2011-10-14 DIAGNOSIS — E785 Hyperlipidemia, unspecified: Secondary | ICD-10-CM

## 2011-10-14 DIAGNOSIS — I2510 Atherosclerotic heart disease of native coronary artery without angina pectoris: Secondary | ICD-10-CM

## 2011-10-14 DIAGNOSIS — R079 Chest pain, unspecified: Secondary | ICD-10-CM

## 2011-10-14 DIAGNOSIS — I5023 Acute on chronic systolic (congestive) heart failure: Secondary | ICD-10-CM

## 2011-10-14 LAB — BASIC METABOLIC PANEL
BUN: 20 mg/dL (ref 6–23)
Calcium: 9.2 mg/dL (ref 8.4–10.5)
Creatinine, Ser: 1 mg/dL (ref 0.4–1.5)
GFR: 74.4 mL/min (ref >60.00)
Glucose, Bld: 116 mg/dL — ABNORMAL HIGH (ref 70–99)

## 2011-10-14 LAB — HEPATIC FUNCTION PANEL
ALT: 23 U/L (ref 0–53)
AST: 28 U/L (ref 0–37)
Alkaline Phosphatase: 37 U/L — ABNORMAL LOW (ref 39–117)
Bilirubin, Direct: 0.1 mg/dL (ref 0.0–0.3)
Total Protein: 7.1 g/dL (ref 6.0–8.3)

## 2011-10-14 LAB — LIPID PANEL: Cholesterol: 108 mg/dL (ref 0–200)

## 2011-10-14 NOTE — Assessment & Plan Note (Signed)
[name] is  doing very well. [name] is still in atrial fibrillation. He asymptomatic. His rate is well controlled. We'll continue with the same medications. We'll continue chronic anticoagulation with rate control and not attempt cardioversion at this point.

## 2011-10-14 NOTE — Patient Instructions (Signed)
Your physician wants you to follow-up in: 6 months  You will receive a reminder letter in the mail two months in advance. If you don't receive a letter, please call our office to schedule the follow-up appointment.  Your physician recommends that you return for a FASTING lipid profile: 6 months   

## 2011-10-14 NOTE — Assessment & Plan Note (Signed)
Cartel has a history of congestive heart failure. He is well compensated this morning. We'll continue with his same medications.

## 2011-10-14 NOTE — Progress Notes (Signed)
[name] Date of Birth  [birth year]       [facility]    [place] [address]  [address] [place], [place]  [address]   [place], [place]  [address] [phone]     [phone]   Fax  [phone]    Fax [phone]  Problem List: 1. CAD - s/p stenting July 2012.  2. Hypertension  3. Hyperlipidemia  4. Diabetes Mellitus    History of Present Illness:  [name] is seen today for a 2 week check. He has known CAD with prior PCI to the LAD last year. Other issues include chronic PVC's and PACs, HTN, DM, HLD and systolic heart failure. He now has atrial fib and will be managed with rate control and anticoagulation. He had a failed attempt at TEE/CV in March  due to inability to pass the probe into the esophagus. His EF is 35 to 40% with mild to moderate AS.  [name] not had any problems. He denies any chest pain or shortness of breath. His INR has been therapeutic. He really is not interested in doing cardio version since he is feeling so well.  Current Outpatient Prescriptions on File Prior to Visit  Medication Sig Dispense Refill  . aspirin EC 81 MG tablet Take 81 mg by mouth daily.      [name] atorvastatin (LIPITOR) 40 MG tablet Take 40 mg by mouth daily.      . Cholecalciferol (VITAMIN D-3 PO) Take 1 tablet by mouth daily.       [name] glimepiride (AMARYL) 4 MG tablet Take 4 mg by mouth daily before breakfast.        . isosorbide mononitrate (IMDUR) 30 MG 24 hr tablet Take 1 tablet (30 mg total) by mouth daily.  30 tablet  11  . linagliptin (TRADJENTA) 5 MG TABS tablet Take 5 mg by mouth daily.        [name] lisinopril (PRINIVIL,ZESTRIL) 5 MG tablet Take 1 tablet (5 mg total) by mouth daily.  30 tablet  11  . Melatonin 10 MG CAPS Take 10 mg by mouth daily. For Sleep       . metoprolol succinate (TOPROL-XL) 100 MG 24 hr tablet Take 200 mg by mouth daily. Take with or immediately following a meal.      . Multiple Vitamin (MULTIVITAMIN) tablet  Take 1 tablet by mouth daily.        . Multiple Vitamins-Minerals (PRESERVISION/LUTEIN) CAPS Take 1 capsule by mouth daily.     0  . NITROSTAT 0.4 MG SL tablet DISSOLVE ONE TABLET UNDER THE TONGUE AS NEEDED FOR CHEST PAIN OR TIGHTNESS  25 each  11  . omeprazole (PRILOSEC) 20 MG capsule Take 20 mg by mouth daily.      [name] Glycol-Propyl Glycol (SYSTANE OP) Apply to eye 4 (four) times daily.        [name] warfarin (COUMADIN) 2 MG tablet Take 1 tablet (2 mg total) by mouth daily.  30 tablet  11    Allergies  Allergen Reactions  . Metformin And Related Nausea And Vomiting    Past Medical History  Diagnosis Date  . Coronary artery disease     s/p PCI to LAD 08/05/10  . Hypertension   . Diabetes mellitus   . Hyperlipidemia   . Barrett esophagus   . Diabetes mellitus   . Gallstone pancreatitis 2011  . Nephrolithiasis   . SBO (small bowel obstruction) 2005  .  Colon cancer   . Macrocytic anemia 06/25/2011  . Atrial flutter with rapid ventricular response 07/18/11    failed TEE due to inability to pass probe into the esophagus in March 2013; now managed with anticoagulation and rate control  . Chronic anticoagulation     Past Surgical History  Procedure Date  . Cholecystectomy 2011  . Right colectomy   . Coronary angioplasty with stent placement 08/05/10    DES to the LAD  . Cardiac catheterization 08/03/10  . Cardiac catheterization 08/09/10    Stent patent  . Abdominal mass resection     Mass near mesentery  . Catracts     bileral removal both eyes    History  Smoking status  . Former Smoker  . Quit date: 05/10/1994  Smokeless tobacco  . Not on file    History  Alcohol Use No    Family History  Problem Relation Age of Onset  . Cancer Mother   . Ulcers Father     Reviw of Systems:  Reviewed in the HPI.  All other systems are negative.  Physical Exam: Blood pressure 136/70, pulse 76, height 5\' 9"  (1.753 m), weight 168 lb 1.9 oz (76.259 kg). The patient is alert  and oriented x 3. The mood and affect are normal.  Skin: warm and dry. Color is normal.  HEENT: he has 2+carotids. He is no JVD.  Lungs: His lungs are clear to auscultation.  Heart: irregularly irregular S1-S2.  Abdomen: His abdominal exam is benign.  Extremities:  no clubbing cyanosis or edema.  Neuro: Nonfocal  Psych:  Responds to questions appropriately with a normal affect.  ECG:  Assessment / Plan:

## 2011-10-15 ENCOUNTER — Other Ambulatory Visit: Payer: Medicare Other

## 2011-10-15 ENCOUNTER — Ambulatory Visit: Payer: Medicare Other | Admitting: Cardiovascular Disease

## 2011-12-21 ENCOUNTER — Emergency Department (HOSPITAL_COMMUNITY)
Admission: EM | Admit: 2011-12-21 | Discharge: 2011-12-22 | Disposition: A | Discharge status: Home / Self Care | Payer: Medicare Other | Attending: Emergency Medicine | Admitting: Emergency Medicine

## 2011-12-21 ENCOUNTER — Encounter (HOSPITAL_COMMUNITY): Payer: Self-pay | Admitting: Emergency Medicine

## 2011-12-21 ENCOUNTER — Emergency Department (HOSPITAL_COMMUNITY): Payer: Medicare Other

## 2011-12-21 DIAGNOSIS — I2510 Atherosclerotic heart disease of native coronary artery without angina pectoris: Secondary | ICD-10-CM | POA: Insufficient documentation

## 2011-12-21 DIAGNOSIS — R062 Wheezing: Secondary | ICD-10-CM | POA: Insufficient documentation

## 2011-12-21 DIAGNOSIS — Z7901 Long term (current) use of anticoagulants: Secondary | ICD-10-CM | POA: Insufficient documentation

## 2011-12-21 DIAGNOSIS — I10 Essential (primary) hypertension: Secondary | ICD-10-CM | POA: Insufficient documentation

## 2011-12-21 DIAGNOSIS — I499 Cardiac arrhythmia, unspecified: Secondary | ICD-10-CM | POA: Insufficient documentation

## 2011-12-21 DIAGNOSIS — E119 Type 2 diabetes mellitus without complications: Secondary | ICD-10-CM | POA: Insufficient documentation

## 2011-12-21 DIAGNOSIS — R0789 Other chest pain: Secondary | ICD-10-CM | POA: Insufficient documentation

## 2011-12-21 DIAGNOSIS — R079 Chest pain, unspecified: Secondary | ICD-10-CM | POA: Insufficient documentation

## 2011-12-21 LAB — COMPREHENSIVE METABOLIC PANEL
ALT: 20 U/L (ref 0–53)
Alkaline Phosphatase: 49 U/L (ref 39–117)
BUN: 20 mg/dL (ref 6–23)
Chloride: 103 mEq/L (ref 96–112)
GFR calc Af Amer: 73 mL/min — ABNORMAL LOW (ref >90)
Glucose, Bld: 242 mg/dL — ABNORMAL HIGH (ref 70–99)
Potassium: 4.1 mEq/L (ref 3.5–5.1)
Sodium: 139 mEq/L (ref 135–145)
Total Bilirubin: 0.5 mg/dL (ref 0.3–1.2)

## 2011-12-21 LAB — URINALYSIS, ROUTINE W REFLEX MICROSCOPIC
Bilirubin Urine: NEGATIVE
Ketones, ur: NEGATIVE mg/dL
Leukocytes, UA: NEGATIVE
Nitrite: NEGATIVE
Urobilinogen, UA: 0.2 mg/dL (ref 0.0–1.0)
pH: 6 (ref 5.0–8.0)

## 2011-12-21 LAB — CBC WITH DIFFERENTIAL/PLATELET
Basophils Absolute: 0 10*3/uL (ref 0.0–0.1)
Basophils Relative: 0 % (ref 0–1)
Eosinophils Relative: 4 % (ref 0–5)
HCT: 37.3 % — ABNORMAL LOW (ref 39.0–52.0)
Lymphocytes Relative: 17 % (ref 12–46)
MCHC: 34.3 g/dL (ref 30.0–36.0)
MCV: 99.2 fL (ref 78.0–100.0)
Monocytes Absolute: 0.6 10*3/uL (ref 0.1–1.0)
Neutro Abs: 3.1 10*3/uL (ref 1.7–7.7)
Platelets: 135 10*3/uL — ABNORMAL LOW (ref 150–400)
RDW: 13.4 % (ref 11.5–15.5)
WBC: 4.6 10*3/uL (ref 4.0–10.5)

## 2011-12-21 LAB — POCT I-STAT TROPONIN I: Troponin i, poc: 0.01 ng/mL (ref 0.00–0.08)

## 2011-12-21 MED ORDER — GI COCKTAIL ~~LOC~~
30.0000 mL | Freq: Once | ORAL | Status: AC
  Administered 2011-12-21: 30 mL via ORAL
  Filled 2011-12-21: qty 30

## 2011-12-21 NOTE — ED Notes (Signed)
PT reports chest pain last evening after supper as well; took nitro and aspirin and went away. Reports chest tightness currently but denies pain; pt also reporting back pain between shoulder blades. Reports when he had repeated incidence this evening nitro did not help so he called EMS. Hx of stent placement by Dr. Riley Kill in 2013 March.

## 2011-12-21 NOTE — ED Provider Notes (Signed)
Patient seen and evaluated with resident. Pertinent history and physical examination performed. Agree with initial assessment, evaluation and treatment initiated by resident. Face-to-face examination and review of evaluation findings were performed.  History includes- New mid back tightness after eating x2, transient. He has ongoing, chronic, stable angina Physical Includes- alert, calm, cooperative. Abdomen soft, and nontender. Heart regular rate and rhythm. No murmur. Lungs clear to  EKG- Date: 12/21/2011  Rate: 57  Rhythm: normal sinus rhythm  QRS Axis: normal  Intervals: normal  ST/T Wave abnormalities: normal  Conduction Disutrbances:right bundle branch block  Narrative Interpretation: PVCs  Old EKG Reviewed: unchanged  Disposition- case discussed with on-call cardiology to arrange outpatient followup.  Second troponin Consultation cardiology. Aftertrop Rec: OP f/u  Flint Melter, MD 12/24/11 [phone]

## 2011-12-21 NOTE — ED Provider Notes (Signed)
History     CSN: [id number]  Arrival date & time 12/21/11  1939   None     Chief Complaint  Patient presents with  . Chest Pain   HPI:  78 year old man with CAD s/p PCI, HTN, DM, and barrett's esophagus presenting with chest tightness and back pain.  Yesterday as the patient was finishing dinner, he had acute onset chest "tightness", a sharp pain in his back, and dyspnea.  The pain was 8/10 and did not radiate.  He took ASA and nitro.  After 10 minutes, the pain and tightness resolved.  Today at dinner, this reoccurred with pain in his back and chest "tightness".  It was again sharp and did not radiate, but it was less severe than the night before; he also had no dyspnea.  He took ASA and nitro again, which provided some relief but these symptoms did not resolve so he came to the ED.  He has stable angina, needing nitro 2-3 times a month, but these episodes are always associated with exertion of one type or another.  Presentation while he is sitting is unusual.  Nausea and diaphoresis were not associated with these symptoms.  He endorses a cough productive of clear to yellow sputum for 2 or 3 months now, but no dyspnea, fevers, or chills.  He denies dysuria, but has been urinating more than usual (4 times a day, none at night) and has a BM almost every time he urinates with soft stool.  He denies hematuria and hematochezia.  Past Medical History  Diagnosis Date  . Coronary artery disease     s/p PCI to LAD 08/05/10  . Hypertension   . Diabetes mellitus   . Hyperlipidemia   . Barrett esophagus   . Diabetes mellitus   . Gallstone pancreatitis 2011  . Nephrolithiasis   . SBO (small bowel obstruction) 2005  . Colon cancer   . Macrocytic anemia 06/25/2011  . Atrial flutter with rapid ventricular response 07/18/11    failed TEE due to inability to pass probe into the esophagus in March 2013; now managed with anticoagulation and rate control  . Chronic anticoagulation     Past Surgical History   Procedure Date  . Cholecystectomy 2011  . Right colectomy   . Coronary angioplasty with stent placement 08/05/10    DES to the LAD  . Cardiac catheterization 08/03/10  . Cardiac catheterization 08/09/10    Stent patent  . Abdominal mass resection     Mass near mesentery  . Catracts     bileral removal both eyes    Family History  Problem Relation Age of Onset  . Cancer Mother   . Ulcers Father     History  Substance Use Topics  . Smoking status: Former Smoker    Quit date: 05/10/1994  . Smokeless tobacco: Not on file  . Alcohol Use: No      Review of Systems  All other systems reviewed and are negative.    Allergies  Metformin and related  Home Medications   Current Outpatient Rx  Name Route Sig Dispense Refill  . ASPIRIN EC 81 MG PO TBEC Oral Take 81 mg by mouth daily.    . ATORVASTATIN CALCIUM 40 MG PO TABS Oral Take 40 mg by mouth daily.    [name] VITAMIN D-3 PO Oral Take 1 tablet by mouth daily.     [name] GLIMEPIRIDE 4 MG PO TABS Oral Take 4 mg by mouth daily before breakfast.      .  ISOSORBIDE MONONITRATE ER 30 MG PO TB24 Oral Take 1 tablet (30 mg total) by mouth daily. 30 tablet 11  . LINAGLIPTIN 5 MG PO TABS Oral Take 5 mg by mouth daily.      [name] LISINOPRIL 5 MG PO TABS Oral Take 1 tablet (5 mg total) by mouth daily. 30 tablet 11  . MELATONIN 10 MG PO CAPS Oral Take 10 mg by mouth at bedtime. For Sleep    . METOPROLOL SUCCINATE ER 100 MG PO TB24 Oral Take 200 mg by mouth daily. Take with or immediately following a meal.    . ONE-DAILY MULTI VITAMINS PO TABS Oral Take 1 tablet by mouth daily.      [name] PRESERVISION/LUTEIN PO CAPS Oral Take 1 capsule by mouth daily.   0  . NITROGLYCERIN 0.4 MG SL SUBL Sublingual Place 0.4 mg under the tongue every 5 (five) minutes as needed. For chest pain    . OMEPRAZOLE 20 MG PO CPDR Oral Take 20 mg by mouth daily.    Frazier Butt OP Ophthalmic Apply to eye 4 (four) times daily.      . WARFARIN SODIUM 4 MG PO TABS Oral Take 4 mg by mouth  daily.      BP 185/69  Temp 97.9 F (36.6 C) (Oral)  Resp 18  SpO2 98%  Physical Exam  Constitutional: He is oriented to person, place, and time. He appears well-developed and well-nourished. He does not appear ill. No distress.  HENT:  Mouth/Throat: Mucous membranes are dry. No oropharyngeal exudate.  Eyes: Conjunctivae and EOM are normal. Pupils are equal, round, and reactive to light.  Neck: Neck supple. Carotid bruit is not present.  Cardiovascular: Normal rate and normal heart sounds.  An irregular rhythm present.  No murmur heard. Pulmonary/Chest: Effort normal. He has wheezes (expiratory, throughout). He has rhonchi (throughout).  Abdominal: Soft. Normal appearance and bowel sounds are normal. He exhibits no pulsatile midline mass and no mass. There is no hepatosplenomegaly. There is no tenderness.  Musculoskeletal: Normal range of motion.  Lymphadenopathy:    He has no cervical adenopathy.  Neurological: He is alert and oriented to person, place, and time. He has normal strength. No cranial nerve deficit or sensory deficit.  Skin: Skin is warm, dry and intact.    ED Course  Procedures (including critical care time)  8:50 PM - Pt reassessed.  Resting comfortably.  Hemodynamically stable.  9:59 PM - Pt reassessed.  Resting comfortably. Hemodynamically stable.  Labs Reviewed  COMPREHENSIVE METABOLIC PANEL - Abnormal; Notable for the following:    Glucose, Bld 242 (*)     GFR calc non Af Amer 63 (*)     GFR calc Af Amer 73 (*)     All other components within normal limits  CBC WITH DIFFERENTIAL - Abnormal; Notable for the following:    RBC 3.76 (*)     Hemoglobin 12.8 (*)     HCT 37.3 (*)     Platelets 135 (*)     All other components within normal limits  URINALYSIS, ROUTINE W REFLEX MICROSCOPIC - Abnormal; Notable for the following:    Glucose, UA 250 (*)     Hgb urine dipstick SMALL (*)     All other components within normal limits  POCT I-STAT TROPONIN I    URINE MICROSCOPIC-ADD ON   Dg Chest 2 View  12/21/2011  *RADIOLOGY REPORT*  Clinical Data: Chest pain.  CHEST - 2 VIEW  Comparison: 07/20/2011  Findings: Coarse fibrotic  changes bilaterally most marked in the lower lobes.  No new airspace infiltrate or overt edema.  No effusion.  Heart size upper limits normal.  Atheromatous aorta. Regional bones unremarkable.  IMPRESSION:  1.  Chronic interstitial lung disease without acute or superimposed abnormality.  Original Report Authenticated By: Thora Lance III, M.D.   EKG: unchanged from previous tracings, atrial fibrillation.     1. Atypical chest pain       MDM  1.   Atypical chest pain:  The most likely etiology is a gastrointestinal one.  Esophageal spasms or reflux esophagitis are most likely.  PUD is less likely. And Esophageal tear or rupture are unlikely.  This could represent progression to unstable angina, occuring at rest, but this is less likely given the back pain.  Troponin was negative twice here and EKG unchanged from previous tracings.  Aortic dissection is unlikely given the CXR findings and time course these symptoms have followed.  Pulmonary etiologies are unlikely, but there is mention of interstitial lung disease on CXR, and physical exam findings are consistent with chronic bronchitis or COPD.   Dr. Elease Hashimoto believes he is safe for discharge.  We will discharge him home and instruct him to call Drs. Griffin and [employer] and schedule close follow up with them.  Lollie Sails, MD 12/21/11 [phone]

## 2011-12-21 NOTE — ED Notes (Signed)
Chest pain starting after dinner, non radiating, center of chest, 3/10, denies N,V. Took aspirin and nitro at home. Pain free at this time. Expressing mild discomfort.

## 2011-12-21 NOTE — ED Notes (Signed)
Returned from [employer].  Pt placed on monitor.  Denies complaints at present.  Family at bedside.

## 2011-12-21 NOTE — ED Notes (Signed)
Patient transported to X-ray 

## 2011-12-22 NOTE — ED Notes (Signed)
Pt alert and oriented x 4 upon discharge.  Pt discharge instructions reviewed.  No respiratory distress noted.  Pt skin warm and dry.

## 2011-12-23 ENCOUNTER — Encounter: Payer: Self-pay | Admitting: Cardiovascular Disease

## 2011-12-23 ENCOUNTER — Ambulatory Visit (INDEPENDENT_AMBULATORY_CARE_PROVIDER_SITE_OTHER): Payer: Medicare Other | Admitting: Cardiovascular Disease

## 2011-12-23 VITALS — BP 150/80 | HR 65 | Ht 69.0 in | Wt 168.0 lb

## 2011-12-23 DIAGNOSIS — R0602 Shortness of breath: Secondary | ICD-10-CM

## 2011-12-23 DIAGNOSIS — R079 Chest pain, unspecified: Secondary | ICD-10-CM

## 2011-12-23 DIAGNOSIS — I2510 Atherosclerotic heart disease of native coronary artery without angina pectoris: Secondary | ICD-10-CM

## 2011-12-23 MED ORDER — ISOSORBIDE MONONITRATE ER 60 MG PO TB24: 60.0000 mg | ORAL_TABLET | Freq: Every day | ORAL | Status: DC

## 2011-12-23 NOTE — Progress Notes (Signed)
[name] Date of Birth  Nov 03, 1923       [facility]    [place] [address]  [address] [place], [place]  [address]   [place], [place]  [address] [phone]     [phone]   Fax  [phone]    Fax [phone]  Problem List: 1. CAD - s/p stenting July 2012.  2. Hypertension  3. Hyperlipidemia  4. Diabetes Mellitus    History of Present Illness:  [name] is seen today for a 2 week check. He has known CAD with prior PCI to the LAD last year. Other issues include chronic PVC's and PACs, HTN, DM, HLD and systolic heart failure. He now has atrial fib and will be managed with rate control and anticoagulation. He had a failed attempt at TEE/CV in March  due to inability to pass the probe into the esophagus. His EF is 35 to 40% with mild to moderate AS.  His INR has been therapeutic. He really is not interested in doing cardioversion.  December 23, 2011 - he was initially seen in the emergency room. He has progressive back pain associated with increasing shortness of breath. [name] noticed that if he takes a nitroglycerin at this back pain and his shortness of breath improved. He has these discomforts with minor levels of exertion. He is also having at rest.  Current Outpatient Prescriptions on File Prior to Visit  Medication Sig Dispense Refill  . aspirin EC 81 MG tablet Take 81 mg by mouth daily.      [name] atorvastatin (LIPITOR) 40 MG tablet Take 40 mg by mouth daily.      . Cholecalciferol (VITAMIN D-3 PO) Take 1 tablet by mouth daily.       [name] glimepiride (AMARYL) 4 MG tablet Take 4 mg by mouth daily before breakfast.        . isosorbide mononitrate (IMDUR) 30 MG 24 hr tablet Take 1 tablet (30 mg total) by mouth daily.  30 tablet  11  . linagliptin (TRADJENTA) 5 MG TABS tablet Take 5 mg by mouth daily.        [name] lisinopril (PRINIVIL,ZESTRIL) 5 MG tablet Take 1 tablet (5 mg total) by mouth daily.  30 tablet  11  . Melatonin 10 MG  CAPS Take 10 mg by mouth at bedtime. For Sleep      . metoprolol succinate (TOPROL-XL) 100 MG 24 hr tablet Take 200 mg by mouth daily. Take with or immediately following a meal.      . Multiple Vitamin (MULTIVITAMIN) tablet Take 1 tablet by mouth daily.        . Multiple Vitamins-Minerals (PRESERVISION/LUTEIN) CAPS Take 1 capsule by mouth daily.     0  . nitroGLYCERIN (NITROSTAT) 0.4 MG SL tablet Place 0.4 mg under the tongue every 5 (five) minutes as needed. For chest pain      . omeprazole (PRILOSEC) 20 MG capsule Take 20 mg by mouth daily.      [name] Glycol-Propyl Glycol (SYSTANE OP) Apply to eye 4 (four) times daily.        [name] warfarin (COUMADIN) 4 MG tablet Take 4 mg by mouth daily.        Allergies  Allergen Reactions  . Metformin And Related Nausea And Vomiting    Past Medical History  Diagnosis Date  . Coronary artery disease     s/p PCI to LAD 08/05/10  . Hypertension   . Diabetes  mellitus   . Hyperlipidemia   . Barrett esophagus   . Diabetes mellitus   . Gallstone pancreatitis 2011  . Nephrolithiasis   . SBO (small bowel obstruction) 2005  . Colon cancer   . Macrocytic anemia 06/25/2011  . Atrial flutter with rapid ventricular response 07/18/11    failed TEE due to inability to pass probe into the esophagus in March 2013; now managed with anticoagulation and rate control  . Chronic anticoagulation     Past Surgical History  Procedure Date  . Cholecystectomy 2011  . Right colectomy   . Coronary angioplasty with stent placement 08/05/10    DES to the LAD  . Cardiac catheterization 08/03/10  . Cardiac catheterization 08/09/10    Stent patent  . Abdominal mass resection     Mass near mesentery  . Catracts     bileral removal both eyes    History  Smoking status  . Former Smoker  . Quit date: 05/10/1994  Smokeless tobacco  . Not on file    History  Alcohol Use No    Family History  Problem Relation Age of Onset  . Cancer Mother   . Ulcers Father      Reviw of Systems:  Reviewed in the HPI.  All other systems are negative.  Physical Exam: Blood pressure 150/80, pulse 65, height 5\' 9"  (1.753 m), weight 168 lb (76.204 kg), SpO2 96.00%. The patient is alert and oriented x 3. The mood and affect are normal.  Skin: warm and dry. Color is normal.  HEENT: he has 2+carotids. He is no JVD.  Lungs: His lungs are clear to auscultation.  Heart: irregularly irregular S1-S2.  Abdomen: His abdominal exam is benign.  Extremities:  no clubbing cyanosis or edema.  Neuro: Nonfocal  Psych:  Responds to questions appropriately with a normal affect.  ECG:  Assessment / Plan:

## 2011-12-23 NOTE — Patient Instructions (Addendum)
Your physician has requested that you have a lexiscan myoview. Please follow instruction sheet, as given.   Your physician has recommended you make the following change in your medication: INCREASE IMDUR TO 60 MG DAILY, DBL UP YOUR CURRENT BOTTLE TILL GONE THEN NEW SCRIPT DOSE IS AT PHARMACY.  Your physician recommends that you schedule a follow-up appointment in: 3 MONTHS

## 2011-12-23 NOTE — Assessment & Plan Note (Addendum)
[name] presents with some episodes of back pain and shortness breath. The symptoms improved with nitroglycerin. He seemed to be progressing over the past several months. He has a history of stent placement in March, 2000 help him concerned that he may have some restenosis or may have progression of other coronary artery disease.  We'll schedule him for a [employer] study. I'll see him again in 3 months for followup visit and I'll see him sooner if needed.

## 2011-12-24 ENCOUNTER — Other Ambulatory Visit: Payer: Self-pay | Admitting: Cardiovascular Disease

## 2011-12-24 NOTE — Telephone Encounter (Signed)
Fax Received. Refill Completed. [name] (R.M.A)   

## 2011-12-28 ENCOUNTER — Ambulatory Visit (HOSPITAL_COMMUNITY): Payer: Medicare Other | Attending: Cardiology | Admitting: Radiology

## 2011-12-28 VITALS — BP 155/81 | Ht 69.0 in | Wt 166.0 lb

## 2011-12-28 DIAGNOSIS — R079 Chest pain, unspecified: Secondary | ICD-10-CM

## 2011-12-28 DIAGNOSIS — I2510 Atherosclerotic heart disease of native coronary artery without angina pectoris: Secondary | ICD-10-CM

## 2011-12-28 DIAGNOSIS — I10 Essential (primary) hypertension: Secondary | ICD-10-CM | POA: Insufficient documentation

## 2011-12-28 DIAGNOSIS — R0602 Shortness of breath: Secondary | ICD-10-CM | POA: Insufficient documentation

## 2011-12-28 DIAGNOSIS — E785 Hyperlipidemia, unspecified: Secondary | ICD-10-CM | POA: Insufficient documentation

## 2011-12-28 DIAGNOSIS — E119 Type 2 diabetes mellitus without complications: Secondary | ICD-10-CM | POA: Insufficient documentation

## 2011-12-28 DIAGNOSIS — R002 Palpitations: Secondary | ICD-10-CM | POA: Insufficient documentation

## 2011-12-28 MED ORDER — REGADENOSON 0.4 MG/5ML IV SOLN
0.4000 mg | Freq: Once | INTRAVENOUS | Status: AC
  Administered 2011-12-28: 0.4 mg via INTRAVENOUS

## 2011-12-28 MED ORDER — TECHNETIUM TC 99M TETROFOSMIN IV KIT
30.0000 | PACK | Freq: Once | INTRAVENOUS | Status: AC | PRN
  Administered 2011-12-28: 30 via INTRAVENOUS

## 2011-12-28 MED ORDER — TECHNETIUM TC 99M TETROFOSMIN IV KIT
10.0000 | PACK | Freq: Once | INTRAVENOUS | Status: AC | PRN
  Administered 2011-12-28: 10 via INTRAVENOUS

## 2011-12-28 NOTE — Progress Notes (Signed)
[facility] SITE 3 NUCLEAR MED [address] [place] [address] [phone]  Cardiology Nuclear Med Study  [name] is a 78 y.o. male     MRN : [id number]     DOB: 11/28/1923  Procedure Date: 12/28/2011  Nuclear Med Background Indication for Stress Test:  Evaluation for Ischemia, Stent Patency and PTCA Patency History:  Afib, Systolic CHF, 3/12 ECHO: EF: 35-40%mod AS, 08/05/10 Angioplasty, 08/09/10 Heart Cath: patent stent and N/O CAD-Stents-LAD  ? Date MPS: [facility] 3/13 Cardioversion: Failed attempt Atrial flutter with RVR failed TEE/CV  Cardiac Risk Factors: Family History - CAD, History of Smoking, Hypertension, Lipids and NIDDM  Symptoms:  Palpitations, SOB and Back Pain   Nuclear Pre-Procedure Caffeine/Decaff Intake:  None NPO After: 10:00am   Lungs:  clear O2 Sat: 96% on room air. IV 0.9% NS with Angio Cath:  20g  IV Site: R Antecubital  IV Started by:  Stanton Kidney, EMT-P  Chest Size (in):  42 Cup Size: n/a  Height: 5\' 9"  (1.753 m)  Weight:  166 lb (75.297 kg)  BMI:  Body mass index is 24.51 kg/(m^2). Tech Comments:  Meds not taken this am, per patient.    Nuclear Med Study 1 or 2 day study: 1 day  Stress Test Type:  Eugenie Birks  Reading MD: Willa Rough, MD  Order Authorizing Provider:  P.Nahser  Resting Radionuclide: Technetium 73m Tetrofosmin  Resting Radionuclide Dose: 11.0 mCi   Stress Radionuclide:  Technetium 79m Tetrofosmin  Stress Radionuclide Dose: 33.0 mCi           Stress Protocol Rest HR: 62 Stress HR: 83  Rest BP: 155/81 Stress BP: 159/79  Exercise Time (min): n/a METS: n/a   Predicted Max HR: 134 bpm % Max HR: 61.94 bpm Rate Pressure Product: 21308   Dose of Adenosine (mg):  n/a Dose of Lexiscan: 0.4 mg  Dose of Atropine (mg): n/a Dose of Dobutamine: n/a mcg/kg/min (at max HR)  Stress Test Technologist: Milana Na, EMT-P  Nuclear Technologist:  Domenic Polite, CNMT     Rest Procedure:  Myocardial perfusion imaging was  performed at rest 45 minutes following the intravenous administration of Technetium 77m Tetrofosmin. Rest ECG: NSR with non-specific ST-T wave changes  Stress Procedure:  The patient received IV Lexiscan 0.4 mg over 15-seconds.  Technetium 32m Tetrofosmin injected at 30-seconds.  There were no significant changes, + chest tightness, rare pacs and freq pvcs with Lexiscan.  Quantitative spect images were obtained after a 45 minute delay. Stress ECG: No significant change from baseline ECG  QPS Raw Data Images:  Patient motion noted; appropriate software correction applied. Stress Images:  Normal homogeneous uptake in all areas of the myocardium. Rest Images:  Normal homogeneous uptake in all areas of the myocardium. Subtraction (SDS):  No evidence of ischemia. Transient Ischemic Dilatation (Normal <1.22):  1.01 Lung/Heart Ratio (Normal <0.45):  0.39  Quantitative Gated Spect Images QGS EDV:  n/a ml QGS ESV:  n/a ml  Impression Exercise Capacity:  Lexiscan with no exercise. BP Response:  Normal blood pressure response. Clinical Symptoms:  chest tightness ECG Impression:  No significant ST segment change suggestive of ischemia. Comparison with Prior Nuclear Study: No images to compare  Overall Impression:  Normal stress nuclear study.  LV Ejection Fraction: Study not gated.  LV Wall Motion:  Normal Wall Motion.  Willa Rough, MD

## 2012-02-13 IMAGING — US US ABDOMEN COMPLETE
1 series · 13 of 25 positions shown · non-contrast
Comparison: CT abdomen 03/05/2005.

CLINICAL DATA: 86-year-old male with right mid abdominal and upper
quadrant pain with nausea.  History of colon cancer status post
several surgeries.

COMPLETE ABDOMINAL ULTRASOUND

[Series 1: us abdomen complete · 0.33mm/px · 13 of 86 slices shown]
[im 1/86]
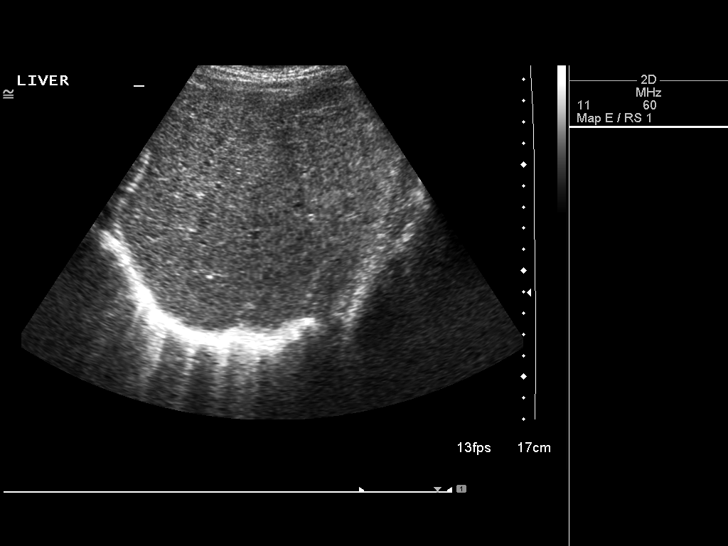
[im 8/86]
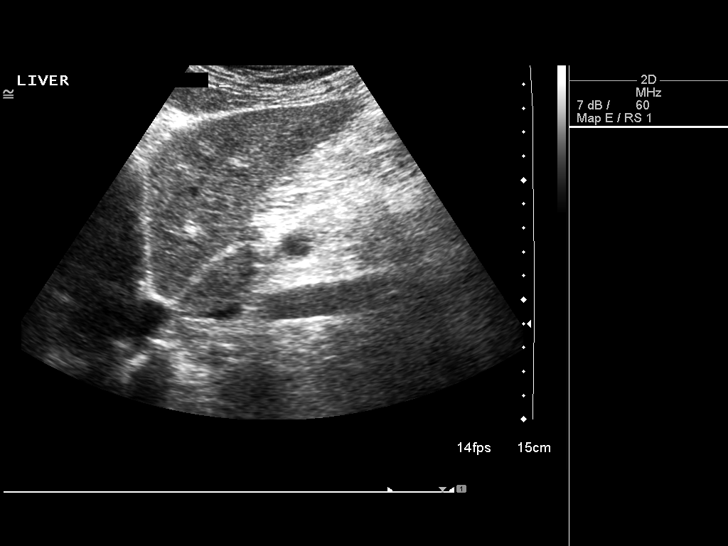
[im 15/86]
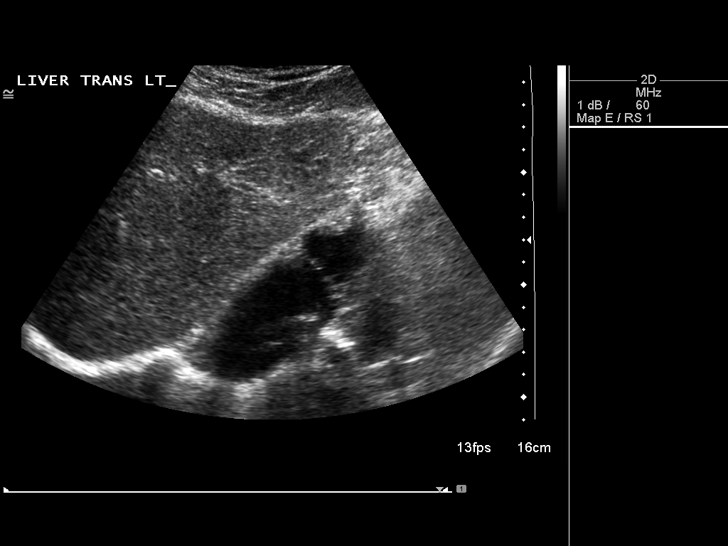
[im 22/86]
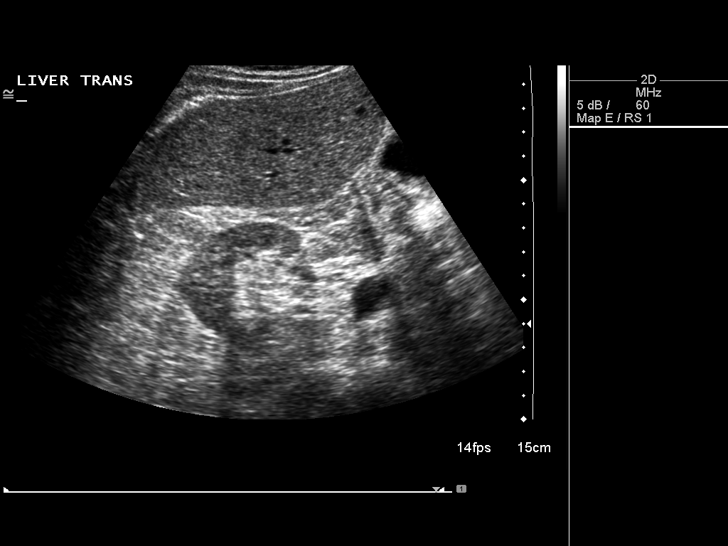
[im 29/86]
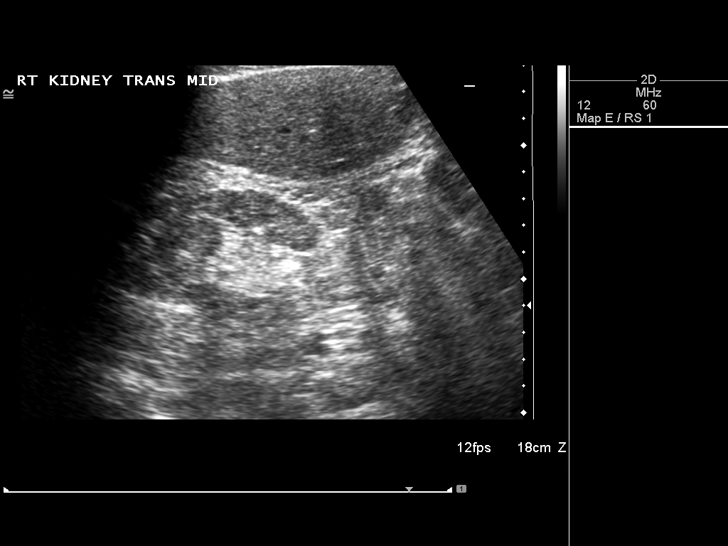
[im 36/86]
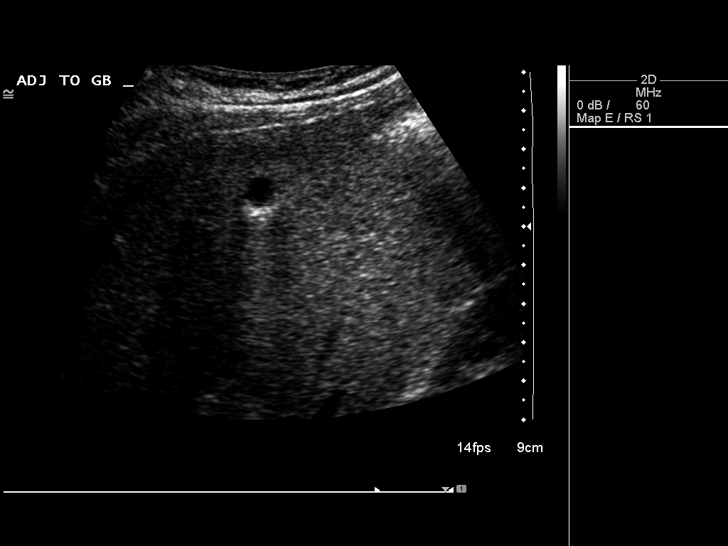
[im 43/86]
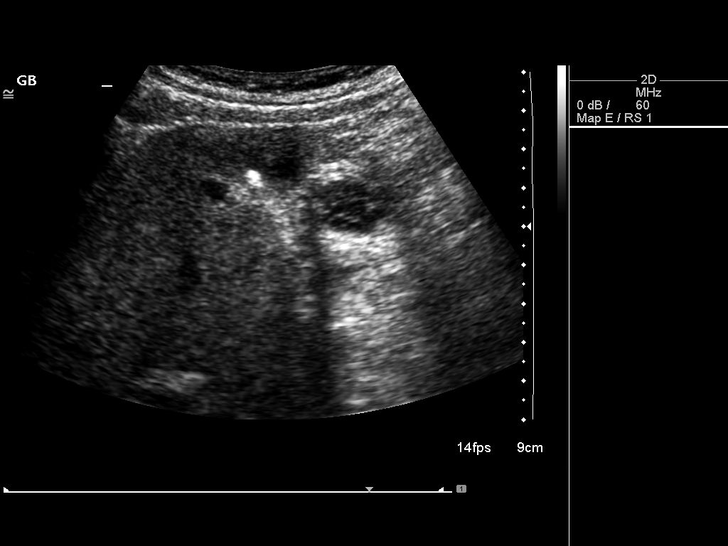
[im 50/86]
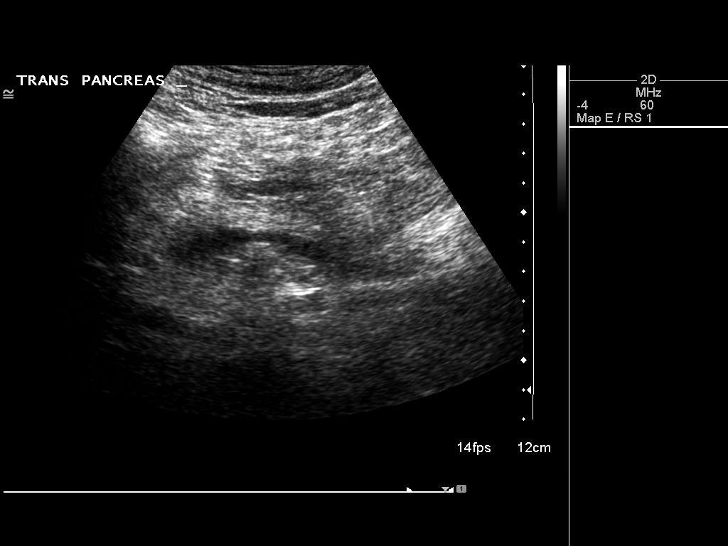
[im 57/86]
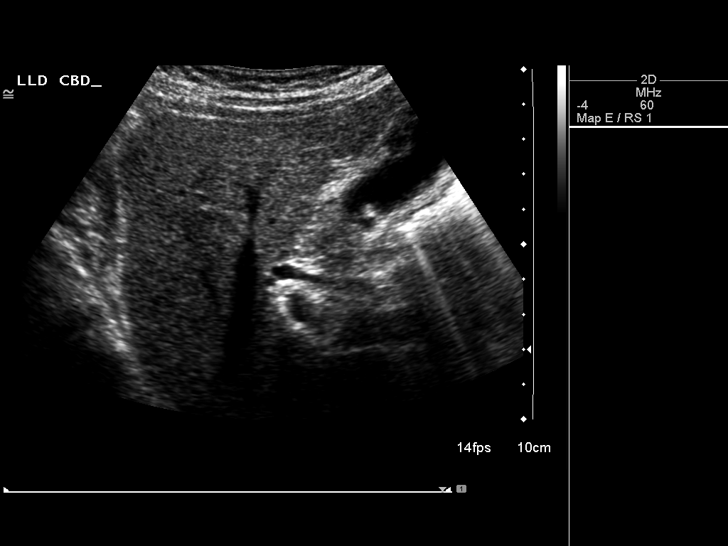
[im 64/86]
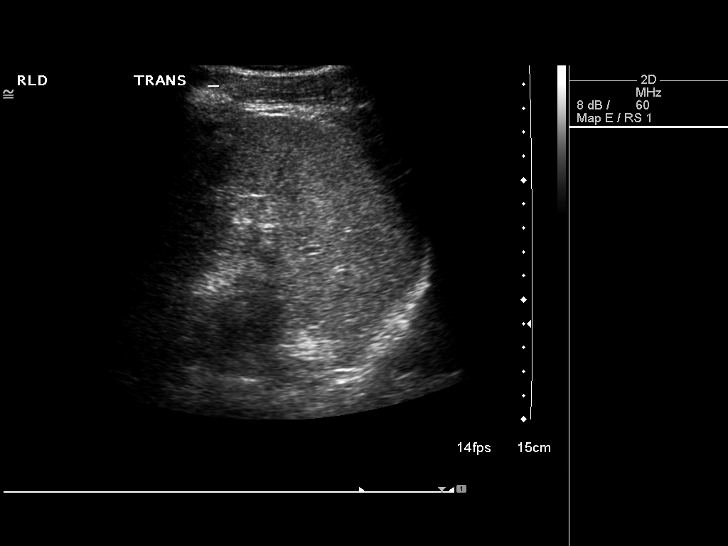
[im 71/86]
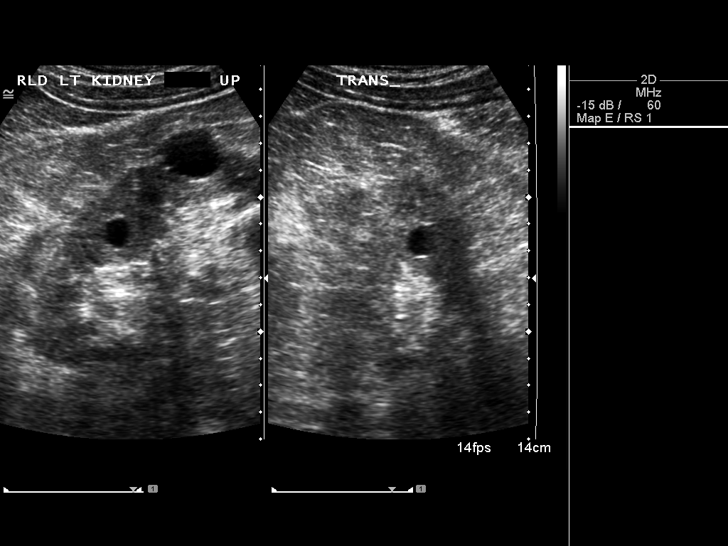
[im 78/86]
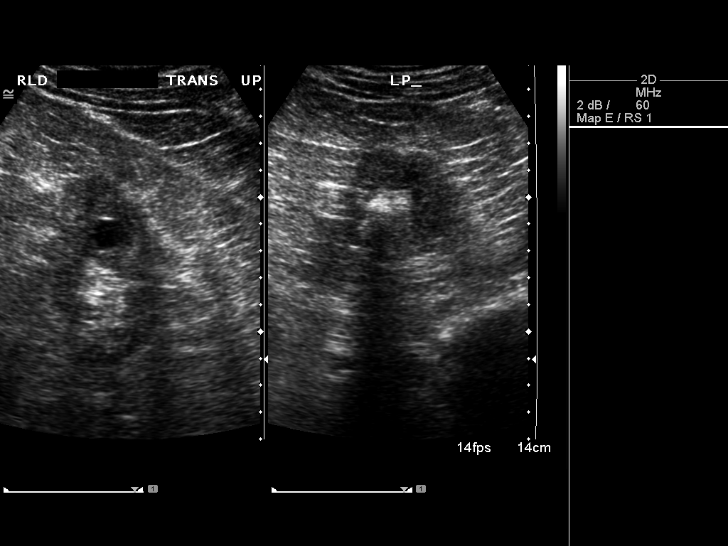
[im 86/86]
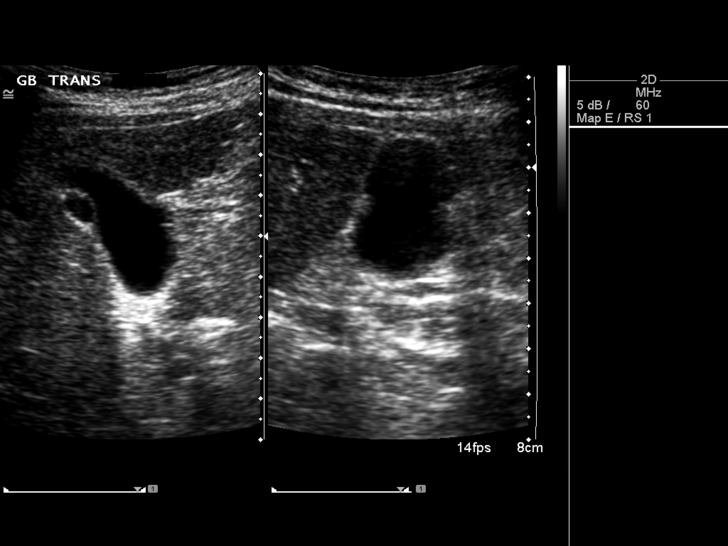

[13 of 25 positions shown; findings below may reference images not displayed]

FINDINGS: Gallbladder:  No focal gallbladder wall thickening or
pericholecystic fluid is present.  No sonographic Murphy's sign is
elicited.  However, there are several irregular areas in the
gallbladder wall.  There are two cystic areas, the larger is
adjacent to the liver parenchyma and shows ring down artifact.
This measures roughly 10 mm in diameter.  A smaller more subtle
area is seen laterally at the gallbladder fossa (image 87).  In
addition, there is a shadowing echogenic focus along the medial
wall of the gallbladder (image 33) measuring 7 mm.  No gallbladder
sludge.

Common bile duct:  Normal measuring 4 mm in diameter.

Liver:  No focal lesion identified.  Within normal limits in
parenchymal echogenicity.

IVC:  Appears normal.

Pancreas:  Mildly heterogeneous.  No focal pancreatic lesion.

Spleen:  Normal measuring 7.3 cm in length.

Right Kidney:  6 mm nonobstructing calculus at the mid pole.
Otherwise normal measuring 11 cm in length.

Left Kidney:  Multiple renal cysts are stable, the largest
measuring 27 mm in diameter at the lateral lower pole.  Multiple
echogenic foci compatible with stones and/or atherosclerosis are
seen in the lower pole and similar to the comparison CT.  Renal
length 11.8 cm.

Abdominal aorta:  Irregularity compatible with atherosclerosis.
Maximum diameter 2.9 cm.
IMPRESSION: 1.  No sonographic findings of acute cholecystitis, but findings
which may reflect a degree of chronic cholecystitis including a 7
mm gallstone is adhered to the gallbladder wall and macroscopic
Awa sinuses.
2.  No acute findings in the abdomen.
3.  Chronic bilateral nephrolithiasis and cystic renal lesions.

## 2012-03-09 ENCOUNTER — Emergency Department (HOSPITAL_COMMUNITY)
Admission: EM | Admit: 2012-03-09 | Discharge: 2012-03-10 | Disposition: A | Discharge status: Home / Self Care | Payer: Medicare Other | Attending: Emergency Medicine | Admitting: Emergency Medicine

## 2012-03-09 ENCOUNTER — Encounter (HOSPITAL_COMMUNITY): Payer: Self-pay

## 2012-03-09 DIAGNOSIS — Z79899 Other long term (current) drug therapy: Secondary | ICD-10-CM | POA: Insufficient documentation

## 2012-03-09 DIAGNOSIS — Z87891 Personal history of nicotine dependence: Secondary | ICD-10-CM | POA: Insufficient documentation

## 2012-03-09 DIAGNOSIS — Z85038 Personal history of other malignant neoplasm of large intestine: Secondary | ICD-10-CM | POA: Insufficient documentation

## 2012-03-09 DIAGNOSIS — Z8719 Personal history of other diseases of the digestive system: Secondary | ICD-10-CM | POA: Insufficient documentation

## 2012-03-09 DIAGNOSIS — R109 Unspecified abdominal pain: Secondary | ICD-10-CM

## 2012-03-09 DIAGNOSIS — Z7982 Long term (current) use of aspirin: Secondary | ICD-10-CM | POA: Insufficient documentation

## 2012-03-09 DIAGNOSIS — Z87442 Personal history of urinary calculi: Secondary | ICD-10-CM | POA: Insufficient documentation

## 2012-03-09 DIAGNOSIS — E119 Type 2 diabetes mellitus without complications: Secondary | ICD-10-CM | POA: Insufficient documentation

## 2012-03-09 DIAGNOSIS — I10 Essential (primary) hypertension: Secondary | ICD-10-CM | POA: Insufficient documentation

## 2012-03-09 DIAGNOSIS — D539 Nutritional anemia, unspecified: Secondary | ICD-10-CM | POA: Insufficient documentation

## 2012-03-09 DIAGNOSIS — R509 Fever, unspecified: Secondary | ICD-10-CM | POA: Insufficient documentation

## 2012-03-09 DIAGNOSIS — E785 Hyperlipidemia, unspecified: Secondary | ICD-10-CM | POA: Insufficient documentation

## 2012-03-09 DIAGNOSIS — D6859 Other primary thrombophilia: Secondary | ICD-10-CM | POA: Insufficient documentation

## 2012-03-09 DIAGNOSIS — Z7901 Long term (current) use of anticoagulants: Secondary | ICD-10-CM | POA: Insufficient documentation

## 2012-03-09 DIAGNOSIS — I2510 Atherosclerotic heart disease of native coronary artery without angina pectoris: Secondary | ICD-10-CM | POA: Insufficient documentation

## 2012-03-09 DIAGNOSIS — R110 Nausea: Secondary | ICD-10-CM | POA: Insufficient documentation

## 2012-03-09 LAB — CBC WITH DIFFERENTIAL/PLATELET
Basophils Absolute: 0 10*3/uL (ref 0.0–0.1)
Basophils Relative: 0 % (ref 0–1)
HCT: 37 % — ABNORMAL LOW (ref 39.0–52.0)
Lymphocytes Relative: 4 % — ABNORMAL LOW (ref 12–46)
MCHC: 34.6 g/dL (ref 30.0–36.0)
Monocytes Absolute: 0.4 10*3/uL (ref 0.1–1.0)
Neutro Abs: 10.2 10*3/uL — ABNORMAL HIGH (ref 1.7–7.7)
Neutrophils Relative %: 92 % — ABNORMAL HIGH (ref 43–77)
Platelets: 123 10*3/uL — ABNORMAL LOW (ref 150–400)
RDW: 12.9 % (ref 11.5–15.5)
WBC: 11 10*3/uL — ABNORMAL HIGH (ref 4.0–10.5)

## 2012-03-09 LAB — PROTIME-INR: Prothrombin Time: 27.9 seconds — ABNORMAL HIGH (ref 11.6–15.2)

## 2012-03-09 LAB — COMPREHENSIVE METABOLIC PANEL
ALT: 22 U/L (ref 0–53)
AST: 28 U/L (ref 0–37)
Albumin: 3.7 g/dL (ref 3.5–5.2)
CO2: 25 mEq/L (ref 19–32)
Chloride: 101 mEq/L (ref 96–112)
Creatinine, Ser: 0.92 mg/dL (ref 0.50–1.35)
GFR calc non Af Amer: 74 mL/min — ABNORMAL LOW (ref >90)
Potassium: 4.3 mEq/L (ref 3.5–5.1)
Sodium: 137 mEq/L (ref 135–145)
Total Bilirubin: 0.6 mg/dL (ref 0.3–1.2)

## 2012-03-09 LAB — GLUCOSE, CAPILLARY: Glucose-Capillary: 159 mg/dL — ABNORMAL HIGH (ref 70–99)

## 2012-03-09 MED ORDER — ACETAMINOPHEN 325 MG PO TABS
650.0000 mg | ORAL_TABLET | Freq: Four times a day (QID) | ORAL | Status: DC | PRN
  Administered 2012-03-09: 650 mg via ORAL
  Filled 2012-03-09: qty 2

## 2012-03-09 NOTE — ED Notes (Signed)
Per EMS, pt ate oyster stew for lunch, went on a walk and began to feel nauseous. No vomiting. Given 4 mg zofran by EMS.

## 2012-03-09 NOTE — ED Provider Notes (Signed)
History     CSN: [id number]  Arrival date & time 03/09/12  2125   First MD Initiated Contact with Patient 03/09/12 2309      Chief Complaint  Patient presents with  . Abdominal Pain  . Nausea    (Consider location/radiation/quality/duration/timing/severity/associated sxs/prior treatment) Patient is a 78 y.o. male presenting with abdominal pain. The history is provided by the patient.  Abdominal Pain The primary symptoms of the illness include abdominal pain.  He ate some oysters at about noon, and then at about 2 PM he developed severe mid abdominal pain. This is associated with nausea without vomiting. There's no radiation of pain. He rates pain at 6/10. He tried taking some Pepto-Bismol with no relief. Nothing made the pain better nothing made it worse. He denies constipation or diarrhea. Pain resolved after arriving in the emergency department and is now completely pain free. He was given Zofran for nausea while in route to the hospital with complete relief of nausea. He was noted to have a fever at triage but he was not aware of a fever he has not had any chills or sweats. He denies any urinary complaints.  Past Medical History  Diagnosis Date  . Coronary artery disease     s/p PCI to LAD 08/05/10  . Hypertension   . Diabetes mellitus   . Hyperlipidemia   . Barrett esophagus   . Diabetes mellitus   . Gallstone pancreatitis 2011  . Nephrolithiasis   . SBO (small bowel obstruction) 2005  . Colon cancer   . Macrocytic anemia 06/25/2011  . Atrial flutter with rapid ventricular response 07/18/11    failed TEE due to inability to pass probe into the esophagus in March 2013; now managed with anticoagulation and rate control  . Chronic anticoagulation   . Colon cancer     Past Surgical History  Procedure Date  . Cholecystectomy 2011  . Right colectomy   . Coronary angioplasty with stent placement 08/05/10    DES to the LAD  . Cardiac catheterization 08/03/10  . Cardiac  catheterization 08/09/10    Stent patent  . Abdominal mass resection     Mass near mesentery  . Catracts     bileral removal both eyes    Family History  Problem Relation Age of Onset  . Cancer Mother   . Ulcers Father     History  Substance Use Topics  . Smoking status: Former Smoker    Quit date: 05/10/1994  . Smokeless tobacco: Not on file  . Alcohol Use: No      Review of Systems  Gastrointestinal: Positive for abdominal pain.  All other systems reviewed and are negative.    Allergies  Metformin and related  Home Medications   Current Outpatient Rx  Name Route Sig Dispense Refill  . ASPIRIN EC 81 MG PO TBEC Oral Take 81 mg by mouth daily.    . ATORVASTATIN CALCIUM 40 MG PO TABS Oral Take 40 mg by mouth daily.    [name] VITAMIN D-3 PO Oral Take 1 tablet by mouth daily.     [name] GLIMEPIRIDE 4 MG PO TABS Oral Take 4 mg by mouth daily after breakfast.     . ISOSORBIDE MONONITRATE ER 60 MG PO TB24 Oral Take 60 mg by mouth 2 (two) times daily.    [name] LINAGLIPTIN 5 MG PO TABS Oral Take 5 mg by mouth daily as needed. For blood sugar    . LISINOPRIL 5 MG PO TABS Oral Take  1 tablet (5 mg total) by mouth daily. 30 tablet 11  . MELATONIN 10 MG PO CAPS Oral Take 5 mg by mouth at bedtime. For Sleep    . METOPROLOL SUCCINATE ER 100 MG PO TB24 Oral Take 100 mg by mouth 2 (two) times daily.     [name] ONE-DAILY MULTI VITAMINS PO TABS Oral Take 1 tablet by mouth daily.      [name] PRESERVISION/LUTEIN PO CAPS Oral Take 1 capsule by mouth daily.   0  . NITROGLYCERIN 0.4 MG SL SUBL Sublingual Place 0.4 mg under the tongue every 5 (five) minutes as needed. For chest pain    . OMEPRAZOLE 20 MG PO CPDR Oral Take 20 mg by mouth daily.    Frazier Butt OP Ophthalmic Apply to eye 4 (four) times daily.      . WARFARIN SODIUM 4 MG PO TABS Oral Take 4 mg by mouth every evening.       BP 143/77  Pulse 93  Temp 102.2 F (39 C) (Oral)  Resp 21  SpO2 92%  Physical Exam  Nursing note and vitals reviewed. 78  year old male, resting comfortably and in no acute distress. Vital signs are significant for fever with temperature 102.2 orally, mild hypertension with blood pressure 143/77, borderline tachypnea with respiratory rate of 21. Oxygen saturation is 92%, which is normal. Head is normocephalic and atraumatic. PERRLA, EOMI. Oropharynx is clear. Neck is nontender and supple without adenopathy or JVD. Back is nontender and there is no CVA tenderness. Lungs are clear without rales, wheezes, or rhonchi. Chest is nontender. Heart has regular rate and rhythm without murmur. Abdomen is soft, flat, nontender without masses or hepatosplenomegaly and peristalsis is hypoactive. Extremities have no cyanosis or edema, full range of motion is present. Skin is warm and dry without rash. Neurologic: Mental status is normal, cranial nerves are intact, there are no motor or sensory deficits.    ED Course  Procedures (including critical care time)  Results for orders placed during the hospital encounter of 03/09/12  GLUCOSE, CAPILLARY      Component Value Range   Glucose-Capillary 159 (*) 70 - 99 mg/dL  CBC WITH DIFFERENTIAL      Component Value Range   WBC 11.0 (*) 4.0 - 10.5 K/uL   RBC 3.73 (*) 4.22 - 5.81 MIL/uL   Hemoglobin 12.8 (*) 13.0 - 17.0 g/dL   HCT 16.1 (*) 09.6 - 04.5 %   MCV 99.2  78.0 - 100.0 fL   MCH 34.3 (*) 26.0 - 34.0 pg   MCHC 34.6  30.0 - 36.0 g/dL   RDW 40.9  81.1 - 91.4 %   Platelets 123 (*) 150 - 400 K/uL   Neutrophils Relative 92 (*) 43 - 77 %   Neutro Abs 10.2 (*) 1.7 - 7.7 K/uL   Lymphocytes Relative 4 (*) 12 - 46 %   Lymphs Abs 0.4 (*) 0.7 - 4.0 K/uL   Monocytes Relative 4  3 - 12 %   Monocytes Absolute 0.4  0.1 - 1.0 K/uL   Eosinophils Relative 1  0 - 5 %   Eosinophils Absolute 0.1  0.0 - 0.7 K/uL   Basophils Relative 0  0 - 1 %   Basophils Absolute 0.0  0.0 - 0.1 K/uL  COMPREHENSIVE METABOLIC PANEL      Component Value Range   Sodium 137  135 - 145 mEq/L   Potassium  4.3  3.5 - 5.1 mEq/L   Chloride 101  96 -  112 mEq/L   CO2 25  19 - 32 mEq/L   Glucose, Bld 172 (*) 70 - 99 mg/dL   BUN 23  6 - 23 mg/dL   Creatinine, Ser 4.69  0.50 - 1.35 mg/dL   Calcium 9.2  8.4 - 62.9 mg/dL   Total Protein 7.0  6.0 - 8.3 g/dL   Albumin 3.7  3.5 - 5.2 g/dL   AST 28  0 - 37 U/L   ALT 22  0 - 53 U/L   Alkaline Phosphatase 45  39 - 117 U/L   Total Bilirubin 0.6  0.3 - 1.2 mg/dL   GFR calc non Af Amer 74 (*) >90 mL/min   GFR calc Af Amer 86 (*) >90 mL/min  LIPASE, BLOOD      Component Value Range   Lipase 40  11 - 59 U/L  URINALYSIS, ROUTINE W REFLEX MICROSCOPIC      Component Value Range   Color, Urine YELLOW  YELLOW   APPearance CLEAR  CLEAR   Specific Gravity, Urine 1.019  1.005 - 1.030   pH 5.0  5.0 - 8.0   Glucose, UA NEGATIVE  NEGATIVE mg/dL   Hgb urine dipstick TRACE (*) NEGATIVE   Bilirubin Urine NEGATIVE  NEGATIVE   Ketones, ur NEGATIVE  NEGATIVE mg/dL   Protein, ur NEGATIVE  NEGATIVE mg/dL   Urobilinogen, UA 0.2  0.0 - 1.0 mg/dL   Nitrite NEGATIVE  NEGATIVE   Leukocytes, UA NEGATIVE  NEGATIVE  PROTIME-INR      Component Value Range   Prothrombin Time 27.9 (*) 11.6 - 15.2 seconds   INR 2.77 (*) 0.00 - 1.49  URINE MICROSCOPIC-ADD ON      Component Value Range   Squamous Epithelial / LPF RARE  RARE   WBC, UA 0-2  <3 WBC/hpf   RBC / HPF 0-2  <3 RBC/hpf   Bacteria, UA RARE  RARE   Dg Chest 2 View  03/10/2012  *RADIOLOGY REPORT*  Clinical Data: Nausea, abdominal pain and cramping.  CHEST - 2 VIEW  Comparison: CT chest 08/09/2011.PA and lateral chest 12/21/2011.  Findings: The lungs are emphysematous with extensive bilateral pulmonary scarring.  No consolidative process, pneumothorax or effusion.  Heart size is upper normal.  IMPRESSION: No acute finding.  Emphysema.   Original Report Authenticated By: Holley Dexter, M.D.       1. Abdominal pain   2. Nausea       MDM  Abdominal pain which has resolved-etiology unclear. Fever of uncertain  cause. Because of borderline hypoxia and fever, chest x-ray will be obtained. He is noted to be on warfarin, so INR will be checked.  INR is therapeutic. Laboratory workup is unremarkable. [name] had no recurrence of pain or nausea. He'll be discharged with prescription for metoclopramide for nausea and instructed to return should symptoms worsen.     Dione Booze, MD 03/10/12 [phone]

## 2012-03-10 ENCOUNTER — Emergency Department (HOSPITAL_COMMUNITY): Payer: Medicare Other

## 2012-03-10 LAB — URINALYSIS, ROUTINE W REFLEX MICROSCOPIC
Glucose, UA: NEGATIVE mg/dL
Ketones, ur: NEGATIVE mg/dL
Leukocytes, UA: NEGATIVE
pH: 5 (ref 5.0–8.0)

## 2012-03-10 LAB — URINE MICROSCOPIC-ADD ON

## 2012-03-10 MED ORDER — METOCLOPRAMIDE HCL 10 MG PO TABS: 10.0000 mg | ORAL_TABLET | Freq: Four times a day (QID) | ORAL | Status: DC | PRN

## 2012-03-20 ENCOUNTER — Encounter: Payer: Self-pay | Admitting: Cardiovascular Disease

## 2012-03-20 ENCOUNTER — Ambulatory Visit (INDEPENDENT_AMBULATORY_CARE_PROVIDER_SITE_OTHER): Payer: Medicare Other | Admitting: Cardiovascular Disease

## 2012-03-20 VITALS — BP 122/60 | HR 76 | Ht 69.0 in | Wt 168.0 lb

## 2012-03-20 DIAGNOSIS — I2510 Atherosclerotic heart disease of native coronary artery without angina pectoris: Secondary | ICD-10-CM

## 2012-03-20 NOTE — Patient Instructions (Addendum)
Your physician wants you to follow-up in: 6 months  You will receive a reminder letter in the mail two months in advance. If you don't receive a letter, please call our office to schedule the follow-up appointment.  Your physician recommends that you return for a FASTING lipid profile: 6 months   

## 2012-03-20 NOTE — Progress Notes (Signed)
[name] Date of Birth  17-Apr-1924       [facility]    [place] [address]  [address] [place], [place]  [address]   [place], [place]  [address] [phone]     [phone]   Fax  [phone]    Fax [phone]  Problem List: 1. CAD - s/p stenting July 2012.  2. Hypertension  3. Hyperlipidemia  4. Diabetes Mellitus    History of Present Illness:  [name] is seen today for a 2 week check. He has known CAD with prior PCI to the LAD last year. Other issues include chronic PVC's and PACs, HTN, DM, HLD and systolic heart failure. He now has atrial fib and will be managed with rate control and anticoagulation. He had a failed attempt at TEE/CV in March  due to inability to pass the probe into the esophagus. His EF is 35 to 40% with mild to moderate AS.  His INR has been therapeutic. He really is not interested in doing cardioversion.  December 23, 2011 - he was initially seen in the emergency room. He has progressive back pain associated with increasing shortness of breath. [name] noticed that if he takes a nitroglycerin at this back pain and his shortness of breath improved. He has these discomforts with minor levels of exertion. He is also having at rest.  March 20, 2012: [name] presented with some episodes of chest pain when I last saw him in August. He had a stress Myoview study that was normal.  He still walks almost every day - he goes around [place] 1-1 1/2 miles a day.  He had a brief episode of CP yesterday - relieved with NTG.  He had some food poisoning recently ( raw oysters)  Current Outpatient Prescriptions on File Prior to Visit  Medication Sig Dispense Refill  . aspirin EC 81 MG tablet Take 81 mg by mouth daily.      [name] atorvastatin (LIPITOR) 40 MG tablet Take 40 mg by mouth daily.      . Cholecalciferol (VITAMIN D-3 PO) Take 1 tablet by mouth daily.       [name] glimepiride (AMARYL) 4 MG tablet Take 4 mg  by mouth daily after breakfast.       . isosorbide mononitrate (IMDUR) 60 MG 24 hr tablet Take 60 mg by mouth 2 (two) times daily.      [name] linagliptin (TRADJENTA) 5 MG TABS tablet Take 5 mg by mouth daily as needed. For blood sugar      . lisinopril (PRINIVIL,ZESTRIL) 5 MG tablet Take 1 tablet (5 mg total) by mouth daily.  30 tablet  11  . Melatonin 10 MG CAPS Take 5 mg by mouth at bedtime. For Sleep      . metoprolol succinate (TOPROL-XL) 100 MG 24 hr tablet Take 100 mg by mouth 2 (two) times daily.       . Multiple Vitamin (MULTIVITAMIN) tablet Take 1 tablet by mouth daily.        . nitroGLYCERIN (NITROSTAT) 0.4 MG SL tablet Place 0.4 mg under the tongue every 5 (five) minutes as needed. For chest pain      . omeprazole (PRILOSEC) 20 MG capsule Take 20 mg by mouth daily.      [name] Glycol-Propyl Glycol (SYSTANE OP) Apply to eye 4 (four) times daily.        [name] warfarin (COUMADIN) 4 MG tablet Take 4 mg by mouth  every evening.       . Multiple Vitamins-Minerals (PRESERVISION/LUTEIN) CAPS Take 1 capsule by mouth daily.     0    Allergies  Allergen Reactions  . Metformin And Related Nausea And Vomiting    Past Medical History  Diagnosis Date  . Coronary artery disease     s/p PCI to LAD 08/05/10  . Hypertension   . Diabetes mellitus   . Hyperlipidemia   . Barrett esophagus   . Diabetes mellitus   . Gallstone pancreatitis 2011  . Nephrolithiasis   . SBO (small bowel obstruction) 2005  . Colon cancer   . Macrocytic anemia 06/25/2011  . Atrial flutter with rapid ventricular response 07/18/11    failed TEE due to inability to pass probe into the esophagus in March 2013; now managed with anticoagulation and rate control  . Chronic anticoagulation   . Colon cancer     Past Surgical History  Procedure Date  . Cholecystectomy 2011  . Right colectomy   . Coronary angioplasty with stent placement 08/05/10    DES to the LAD  . Cardiac catheterization 08/03/10  . Cardiac catheterization  08/09/10    Stent patent  . Abdominal mass resection     Mass near mesentery  . Catracts     bileral removal both eyes    History  Smoking status  . Former Smoker  . Quit date: 05/10/1994  Smokeless tobacco  . Not on file    History  Alcohol Use No    Family History  Problem Relation Age of Onset  . Cancer Mother   . Ulcers Father     Reviw of Systems:  Reviewed in the HPI.  All other systems are negative.  Physical Exam: Blood pressure 122/60, pulse 76, height 5\' 9"  (1.753 m), weight 168 lb (76.204 kg), SpO2 96.00%. The patient is alert and oriented x 3. The mood and affect are normal.  Skin: warm and dry. Color is normal.  HEENT: he has 2+carotids. He is no JVD.  Lungs: His lungs are clear to auscultation.  Heart: irregularly irregular S1-S2.  Abdomen: His abdominal exam is benign.  Extremities:  no clubbing cyanosis or edema.  Neuro: Nonfocal  Psych:  Responds to questions appropriately with a normal affect.  ECG:  Assessment / Plan:

## 2012-03-20 NOTE — Assessment & Plan Note (Signed)
Denard continues to do well. He has intermittent episodes of chest discomfort. He had a negative stress test in August.  I have encouraged him to continue exercising on a regular basis. I've asked him to call me if he has chest pains worsen. We'll see him again in 6 months. We'll check fasting labs at that time.

## 2012-05-30 ENCOUNTER — Encounter: Payer: Self-pay | Admitting: Nurse Practitioner

## 2012-05-30 ENCOUNTER — Ambulatory Visit (INDEPENDENT_AMBULATORY_CARE_PROVIDER_SITE_OTHER): Payer: Medicare Other | Admitting: Nurse Practitioner

## 2012-05-30 VITALS — BP 156/64 | HR 84 | Ht 69.0 in | Wt 165.8 lb

## 2012-05-30 DIAGNOSIS — R079 Chest pain, unspecified: Secondary | ICD-10-CM

## 2012-05-30 LAB — BASIC METABOLIC PANEL
BUN: 23 mg/dL (ref 6–23)
CO2: 27 mEq/L (ref 19–32)
Calcium: 9.5 mg/dL (ref 8.4–10.5)
Chloride: 107 mEq/L (ref 96–112)
Creatinine, Ser: 1.1 mg/dL (ref 0.4–1.5)
GFR: 69.5 mL/min (ref >60.00)
Glucose, Bld: 115 mg/dL — ABNORMAL HIGH (ref 70–99)
Potassium: 5 mEq/L (ref 3.5–5.1)
Sodium: 139 mEq/L (ref 135–145)

## 2012-05-30 LAB — CBC WITH DIFFERENTIAL/PLATELET
Basophils Absolute: 0 10*3/uL (ref 0.0–0.1)
Basophils Relative: 0.4 % (ref 0.0–3.0)
Eosinophils Absolute: 0.6 10*3/uL (ref 0.0–0.7)
Eosinophils Relative: 11.2 % — ABNORMAL HIGH (ref 0.0–5.0)
HCT: 38 % — ABNORMAL LOW (ref 39.0–52.0)
Hemoglobin: 12.8 g/dL — ABNORMAL LOW (ref 13.0–17.0)
Lymphocytes Relative: 14 % (ref 12.0–46.0)
Lymphs Abs: 0.8 10*3/uL (ref 0.7–4.0)
MCHC: 33.6 g/dL (ref 30.0–36.0)
MCV: 101 fl — ABNORMAL HIGH (ref 78.0–100.0)
Monocytes Absolute: 0.7 10*3/uL (ref 0.1–1.0)
Monocytes Relative: 12 % (ref 3.0–12.0)
Neutro Abs: 3.6 10*3/uL (ref 1.4–7.7)
Neutrophils Relative %: 62.4 % (ref 43.0–77.0)
Platelets: 182 10*3/uL (ref 150.0–400.0)
RBC: 3.76 Mil/uL — ABNORMAL LOW (ref 4.22–5.81)
RDW: 13.4 % (ref 11.5–14.6)
WBC: 5.8 10*3/uL (ref 4.5–10.5)

## 2012-05-30 LAB — TROPONIN I: Troponin I: <0.3 ng/mL (ref <0.30)

## 2012-05-30 MED ORDER — ISOSORBIDE MONONITRATE ER 60 MG PO TB24: 90.0000 mg | ORAL_TABLET | Freq: Two times a day (BID) | ORAL | Status: DC

## 2012-05-30 NOTE — Progress Notes (Signed)
[name] Date of Birth: [birth year] Medical Record #[id number]  History of Present Illness: Kashius is seen back today for a work in visit. He is seen for Dr. Elease Hashimoto. He has multiple issues in light of his 88 years. He has known CAD with prior PCI to the LAD in 2012, negative Myoview in August of 2013. Other issues include PVCs, PACs, HTN, DM, HLD and systolic heart failure.   He was last seen back in November. Felt to be doing ok.  He comes in today. He is here alone. He has had a URI over the last 10 days or so. Almost finished amoxil. Has had significant cough with phlegm. Has had chest pain over about the same time frame. He has used more NTG and aspirin over the last 10 days or so. Not sure if he gets quick relief or not. Admits that his memory is "just not that good". But better over the past 2 days. No worsening of his breathing. No swelling. He was walking fairly regularly up until he got this URI.   Current Outpatient Prescriptions on File Prior to Visit  Medication Sig Dispense Refill  . aspirin EC 81 MG tablet Take 81 mg by mouth daily.      [name] atorvastatin (LIPITOR) 40 MG tablet Take 40 mg by mouth daily.       . Cholecalciferol (VITAMIN D-3 PO) Take 1 tablet by mouth daily.       [name] glimepiride (AMARYL) 4 MG tablet Take 4 mg by mouth daily after breakfast.       . isosorbide mononitrate (IMDUR) 60 MG 24 hr tablet Take 60 mg by mouth 2 (two) times daily.      [name] linagliptin (TRADJENTA) 5 MG TABS tablet Take 5 mg by mouth daily as needed. For blood sugar      . lisinopril (PRINIVIL,ZESTRIL) 5 MG tablet Take 1 tablet (5 mg total) by mouth daily.  30 tablet  11  . Melatonin 10 MG CAPS Take 5 mg by mouth at bedtime. For Sleep      . metoprolol succinate (TOPROL-XL) 100 MG 24 hr tablet Take 100 mg by mouth 2 (two) times daily.       . Multiple Vitamins-Minerals (PRESERVISION/LUTEIN) CAPS Take 1 capsule by mouth daily.     0  . nitroGLYCERIN (NITROSTAT) 0.4 MG SL tablet Place 0.4  mg under the tongue every 5 (five) minutes as needed. For chest pain       . omeprazole (PRILOSEC) 20 MG capsule Take 20 mg by mouth daily.      [name] Glycol-Propyl Glycol (SYSTANE OP) Apply to eye as directed. 2-4 times a day      . warfarin (COUMADIN) 4 MG tablet Take 4 mg by mouth every evening.         Allergies  Allergen Reactions  . Metformin And Related Nausea And Vomiting    Past Medical History  Diagnosis Date  . Coronary artery disease     s/p PCI to LAD 08/05/10  . Hypertension   . Diabetes mellitus   . Hyperlipidemia   . Barrett esophagus   . Diabetes mellitus   . Gallstone pancreatitis 2011  . Nephrolithiasis   . SBO (small bowel obstruction) 2005  . Colon cancer   . Macrocytic anemia 06/25/2011  . Atrial flutter with rapid ventricular response 07/18/11    failed TEE due to inability to pass probe into the esophagus in March 2013; now managed with anticoagulation and  rate control  . Chronic anticoagulation   . Colon cancer     Past Surgical History  Procedure Date  . Cholecystectomy 2011  . Right colectomy   . Coronary angioplasty with stent placement 08/05/10    DES to the LAD  . Cardiac catheterization 08/03/10  . Cardiac catheterization 08/09/10    Stent patent  . Abdominal mass resection     Mass near mesentery  . Catracts     bileral removal both eyes    History  Smoking status  . Former Smoker  . Quit date: 05/10/1994  Smokeless tobacco  . Not on file    History  Alcohol Use No    Family History  Problem Relation Age of Onset  . Cancer Mother   . Ulcers Father     Review of Systems: The review of systems is positive for .  All other systems were reviewed and are negative.  Physical Exam: BP 156/64  Pulse 84  Ht 5\' 9"  (1.753 m)  Wt 165 lb 12.8 oz (75.206 kg)  BMI 24.48 kg/m2 His weight is actually down 3 pounds.  Patient is very pleasant and in no acute distress. Skin is warm and dry. Color is normal.  HEENT is unremarkable.  Normocephalic/atraumatic. PERRL. Sclera are nonicteric. Neck is supple. No masses. No JVD. Lungs are fairly clear. Cardiac exam shows an irregular rhythm. Rate is ok. Abdomen is soft. Extremities are without edema. Gait and ROM are intact. No gross neurologic deficits noted.  LABORATORY DATA: EKG shows probably atrial fib with PVCs, one couplet and RBBB. Rate is controlled at 84 bpm.  Labs are pending for today.    Myoview Overall Impression from August 2013: Normal stress nuclear study.  LV Ejection Fraction: Study not gated. LV Wall Motion: Normal Wall Motion.  Willa Rough, MD   CORONARY ANGIOGRAPHIC DATA: The left main coronary artery was normal.  The left anterior descending artery had a 30% ostial stenosis. There is  also a 30% stenosis in the distal vessel. The stent in the mid LAD was  widely patent.  The left circumflex branch had a 40% segmental stenosis in the proximal vessel.  The left circumflex coronary artery had mild irregularities, less than  or equal to 20%.  Right coronary artery has mild irregularities throughout less than 20%.  FINAL INTERPRETATION: Nonobstructive coronary artery disease There is  continued excellent patency of the stent to the left anterior  descending.  PLAN: We will continue medical therapy.  ______________________________  Peter M. [place], M.D.  PMJ/MEDQ D: 08/10/2010 T: 08/11/2010 Job: [id number]  Electronically Signed by PETER [place] M.D. on 08/11/2010 11:18:28 AM   Assessment / Plan: 1. Chest pain - with concurrent URI - finishing antibiotics. Has known CAD and this could certainly be angina. I have talked with Dr. Elease Hashimoto. We would prefer medically managing Zollie Beckers. Will increase his Imdur to 90 mg BID. Hopefully as his URI resolves, his chest pain will as well. He is to call and let us know if he does not improve or if he gets worse.   2. Afib - on coumadin. Says his last INR was 1.8. Managed by Dr. Valentina Lucks  3. Systolic heart failure - does  not appear to be volume overloaded to me today.   Will tentatively see him back at his regular time. Patient is agreeable to this plan and will call if any problems develop in the interim.

## 2012-05-30 NOTE — Patient Instructions (Addendum)
Let's increase your Imdur (isosorbide) to 1 1/2 tablets two times a day   We need to check labs today  Let us know if you do not improve over the next couple of days  You can keep using your NTG if needed. Use your NTG under your tongue for recurrent chest pain. May take one tablet every 5 minutes. If you are still having discomfort after 3 tablets in 15 minutes, call 911.  Call the [facility] office at [phone] if you have any questions, problems or concerns.

## 2012-06-02 ENCOUNTER — Telehealth: Payer: Self-pay | Admitting: Cardiovascular Disease

## 2012-06-02 NOTE — Telephone Encounter (Signed)
New problem:    Seen in the office on  1/21.     C/o full body rash. Increase medication Isosorbide 60 mg twice a day

## 2012-06-02 NOTE — Telephone Encounter (Signed)
Pt calling back concerned with full body rash, pls call to

## 2012-06-02 NOTE — Telephone Encounter (Signed)
Patient called stated he has a red rash all over his body started yesterday 05/31/12.States does not itch.Stated isosorbide increased to 60 mg twice a day.States just finished amoxicillin 2 days ago.States took amoxicillin 500 mg twice a day for 10 days.Spoke with DOD Dr.Hochrein he advised rash not coming from isosorbide.Advised probably came from the amoxicillin.Patient advised may take benadryl 25 mg as directed.Advised to see PCP if not better.

## 2012-06-16 ENCOUNTER — Other Ambulatory Visit: Payer: Medicare Other | Admitting: Lab

## 2012-06-16 ENCOUNTER — Ambulatory Visit (HOSPITAL_BASED_OUTPATIENT_CLINIC_OR_DEPARTMENT_OTHER): Payer: Medicare Other | Admitting: Oncology

## 2012-06-16 ENCOUNTER — Telehealth: Payer: Self-pay | Admitting: Oncology

## 2012-06-16 ENCOUNTER — Encounter: Payer: Self-pay | Admitting: Oncology

## 2012-06-16 VITALS — BP 158/76 | HR 66 | Temp 97.2°F | Resp 18 | Ht 69.0 in | Wt 164.8 lb

## 2012-06-16 DIAGNOSIS — C772 Secondary and unspecified malignant neoplasm of intra-abdominal lymph nodes: Secondary | ICD-10-CM | POA: Insufficient documentation

## 2012-06-16 DIAGNOSIS — Z85038 Personal history of other malignant neoplasm of large intestine: Secondary | ICD-10-CM

## 2012-06-16 DIAGNOSIS — D539 Nutritional anemia, unspecified: Secondary | ICD-10-CM

## 2012-06-16 DIAGNOSIS — C189 Malignant neoplasm of colon, unspecified: Secondary | ICD-10-CM

## 2012-06-16 NOTE — Progress Notes (Signed)
Hematology and Oncology Follow Up Visit  [name] [id number] [birth year] 79 y.o. 06/16/2012 5:44 PM   Principle Diagnosis: Encounter Diagnosis  Name Primary?  . Colon cancer metastasized to mesenteric lymph nodes Yes     Interim History:   Followup visit for this pleasant 79 year old man with history of colon cancer with initial stage III disease diagnosed in October 1992. Status post sigmoid colectomy followed by adjuvant chemotherapy with 5-fluorouracil. He had a retroperitoneal recurrence in July of 1993 treated aggressively with surgery, radiation, and chemotherapy. He achieved a durable remission and is likely cured.  Over the years he has developed a mild macrocytic anemia since 2007 with normal vitamin studies and normal serum immunoglobulins with IFE. He likely has a low grade myelodysplastic syndrome. I never did a bone marrow biopsy since hemoglobins have been stable at 12-13 g.  Most active problem at this point relates to his heart and his type 2 diabetes. He has known single-vessel coronary artery disease. He had a coronary stent placed  In July of 2012.  He has subsequently developed atrial fibrillation.   He has moderate aortic stenosis. Most recent ejection fraction 35-40%. He is on both aspirin and Coumadin.  He is doing well since his visit with me last year. He has had no intramedullary medical problems. He just had a followup visit with his cardiologist last month. He was having some increased anginal type chest pain. His nitrates were increased.  He reports no abdominal pain, no change in bowel habits, no hematochezia or melena.     Medications: reviewed  Allergies:  Allergies  Allergen Reactions  . Metformin And Related Nausea And Vomiting    Review of Systems: Constitutional:   Appetite is good, weight study Respiratory: No cough or dyspnea Cardiovascular:  See above Gastrointestinal: See above Genito-Urinary: He denies any difficulty  voiding Musculoskeletal: No muscle or bone pain Neurologic: No headache or change in vision Skin: No rash Remaining ROS negative.  Physical Exam: Blood pressure 158/76, pulse 66, temperature 97.2 F (36.2 C), temperature source Oral, resp. rate 18, height 5\' 9"  (1.753 m), weight 164 lb 12.8 oz (74.753 kg). Wt Readings from Last 3 Encounters:  06/16/12 164 lb 12.8 oz (74.753 kg)  05/30/12 165 lb 12.8 oz (75.206 kg)  03/20/12 168 lb (76.204 kg)     General appearance: Thin but well nourished Caucasian man HENNT: Pharynx no erythema or exudate Lymph nodes: No adenopathy Breasts: Lungs: Clear to auscultation resonant to percussion Heart: Regular rhythm, no murmur Abdomen: Soft, nontender, multiple surgical scars, no mass, no organomegaly Extremities: Trace edema, no calf tenderness Vascular: No cyanosis Neurologic: Motor strength 5 over 5, reflexes 1+ symmetric Skin: No rash or ecchymosis  Lab Results: Lab Results  Component Value Date   WBC 5.8 05/30/2012   HGB 12.8* 05/30/2012   HCT 38.0* 05/30/2012   MCV 101.0* 05/30/2012   PLT 182.0 05/30/2012     Chemistry      Component Value Date/Time   NA 139 05/30/2012 1524   K 5.0 05/30/2012 1524   CL 107 05/30/2012 1524   CO2 27 05/30/2012 1524   BUN 23 05/30/2012 1524   CREATININE 1.1 05/30/2012 1524   CREATININE 1.44 07/28/2010 1745   CREATININE 1.54* 06/22/2010 1053      Component Value Date/Time   CALCIUM 9.5 05/30/2012 1524   ALKPHOS 45 03/09/2012 2212   AST 28 03/09/2012 2212   ALT 22 03/09/2012 2212   BILITOT 0.6 03/09/2012 2212  Impression and Plan: #1. Localized recurrence of colon cancer initially diagnosed in 1992, recurrence in July 1993, treated as outlined above. Likely cured at this time.  He had upper endoscopy 2 years ago in March 2012 for atypical chest pain and it was unrevealing. Last colonoscopy done in 2009. He is now 79 years old. I doubt that he needs any routine colonoscopies at this time. #2. Chronic  stable macrocytic anemia hemoglobin 12 g.-13 #3. Coronary artery disease  status post single-vessel coronary stent now on aspirin and Coumadin #4. Type 2 diabetes on oral agent.  #5. Mild chronic renal insufficiency current creatinine 1.1. Improved compared with values obtained her last year of 1.5. #6. Essential hypertension.  #7. Hyperlipidemia. #8. Atrial fibrillation on Coumadin    CC:. Dr. Kirby Funk; Dr. Christiana Pellant; Dr. Kristeen Miss;   Levert Feinstein, MD 2/7/20145:44 PM

## 2012-06-16 NOTE — Telephone Encounter (Signed)
gv and printed appt schedule for pt for Feb 2015 °

## 2012-06-26 ENCOUNTER — Other Ambulatory Visit: Payer: Self-pay | Admitting: *Deleted

## 2012-06-26 MED ORDER — ATORVASTATIN CALCIUM 40 MG PO TABS: 40.0000 mg | ORAL_TABLET | Freq: Every day | ORAL | Status: DC

## 2012-08-15 ENCOUNTER — Other Ambulatory Visit: Payer: Self-pay | Admitting: *Deleted

## 2012-08-15 MED ORDER — NITROGLYCERIN 0.4 MG SL SUBL: 0.4000 mg | SUBLINGUAL_TABLET | SUBLINGUAL | Status: DC | PRN

## 2012-08-15 NOTE — Telephone Encounter (Signed)
Fax Received. Refill Completed. Dal Blew Chowoe (R.M.A)   

## 2012-09-25 ENCOUNTER — Encounter: Payer: Self-pay | Admitting: Cardiovascular Disease

## 2012-09-25 ENCOUNTER — Other Ambulatory Visit: Payer: Medicare Other

## 2012-09-26 ENCOUNTER — Encounter: Payer: Self-pay | Admitting: Radiology

## 2012-09-26 ENCOUNTER — Encounter: Payer: Self-pay | Admitting: Cardiovascular Disease

## 2012-09-27 ENCOUNTER — Ambulatory Visit (INDEPENDENT_AMBULATORY_CARE_PROVIDER_SITE_OTHER): Payer: Medicare Other | Admitting: *Deleted

## 2012-09-27 DIAGNOSIS — E785 Hyperlipidemia, unspecified: Secondary | ICD-10-CM

## 2012-09-27 DIAGNOSIS — I10 Essential (primary) hypertension: Secondary | ICD-10-CM

## 2012-09-27 LAB — HEPATIC FUNCTION PANEL
ALT: 21 U/L (ref 0–53)
AST: 24 U/L (ref 0–37)
Albumin: 3.6 g/dL (ref 3.5–5.2)
Total Bilirubin: 0.7 mg/dL (ref 0.3–1.2)

## 2012-09-27 LAB — BASIC METABOLIC PANEL
CO2: 29 mEq/L (ref 19–32)
GFR: 79.67 mL/min (ref >60.00)
Glucose, Bld: 115 mg/dL — ABNORMAL HIGH (ref 70–99)
Potassium: 4.3 mEq/L (ref 3.5–5.1)
Sodium: 139 mEq/L (ref 135–145)

## 2012-09-27 LAB — LIPID PANEL
HDL: 36.6 mg/dL — ABNORMAL LOW (ref >39.00)
Total CHOL/HDL Ratio: 3
Triglycerides: 60 mg/dL (ref 0.0–149.0)
VLDL: 12 mg/dL (ref 0.0–40.0)

## 2012-10-03 ENCOUNTER — Ambulatory Visit: Payer: Medicare Other | Admitting: Cardiovascular Disease

## 2012-10-03 ENCOUNTER — Encounter: Payer: Self-pay | Admitting: Cardiovascular Disease

## 2012-10-03 ENCOUNTER — Ambulatory Visit (INDEPENDENT_AMBULATORY_CARE_PROVIDER_SITE_OTHER): Payer: Medicare Other | Admitting: Cardiovascular Disease

## 2012-10-03 VITALS — BP 110/60 | HR 79 | Ht 69.0 in | Wt 166.1 lb

## 2012-10-03 DIAGNOSIS — E785 Hyperlipidemia, unspecified: Secondary | ICD-10-CM

## 2012-10-03 DIAGNOSIS — I2510 Atherosclerotic heart disease of native coronary artery without angina pectoris: Secondary | ICD-10-CM

## 2012-10-03 DIAGNOSIS — I4891 Unspecified atrial fibrillation: Secondary | ICD-10-CM

## 2012-10-03 NOTE — Assessment & Plan Note (Addendum)
Stable,  Clinically he is still in Afib.  Will get an ECG in 6 months at his next OV.

## 2012-10-03 NOTE — Patient Instructions (Addendum)
Your physician wants you to follow-up in: 6 months You will receive a reminder letter in the mail two months in advance. If you don't receive a letter, please call our office to schedule the follow-up appointment.  Your physician recommends that you return for a FASTING lipid profile: 6 months   Your physician recommends that you continue on your current medications as directed. Please refer to the Current Medication list given to you today.  

## 2012-10-03 NOTE — Progress Notes (Signed)
[name] Date of Birth  [birth year]       [facility]    [place] [address]  [address] [place], [place]  [address]   [place], [place]  [address] [phone]     [phone]   Fax  [phone]    Fax [phone]  Problem List: 1. CAD - s/p stenting July 2012.  2. Hypertension  3. Hyperlipidemia  4. Diabetes Mellitus    History of Present Illness:  [name] is seen today for a 2 week check. He has known CAD with prior PCI to the LAD last year. Other issues include chronic PVC's and PACs, HTN, DM, HLD and systolic heart failure. He now has atrial fib and will be managed with rate control and anticoagulation. He had a failed attempt at TEE/CV in March  due to inability to pass the probe into the esophagus. His EF is 35 to 40% with mild to moderate AS.  His INR has been therapeutic. He really is not interested in doing cardioversion.  December 23, 2011 - he was initially seen in the emergency room. He has progressive back pain associated with increasing shortness of breath. [name] noticed that if he takes a nitroglycerin at this back pain and his shortness of breath improved. He has these discomforts with minor levels of exertion. He is also having at rest.  March 20, 2012: [name] presented with some episodes of chest pain when I last saw him in August. He had a stress Myoview study that was normal.  He still walks almost every day - he goes around [place] 1-1 1/2 miles a day.  He had a brief episode of CP yesterday - relieved with NTG.  He had some food poisoning recently ( raw oysters)  Oct 03, 2012:  He has been having some mid abdominal pain with radiation around to the back.  He took a NTG with relief.   The pain was a dull pain and would last 30 minutes or so.    He has been putting in his garden without any without any problems.    Current Outpatient Prescriptions on File Prior to Visit  Medication Sig  Dispense Refill  . aspirin EC 81 MG tablet Take 81 mg by mouth daily.      [name] atorvastatin (LIPITOR) 40 MG tablet Take 1 tablet (40 mg total) by mouth daily.  30 tablet  6  . Cholecalciferol (VITAMIN D-3 PO) Take 1 tablet by mouth daily.       [name] glimepiride (AMARYL) 4 MG tablet Take 4 mg by mouth daily after breakfast.       . isosorbide mononitrate (IMDUR) 60 MG 24 hr tablet Take 1.5 tablets (90 mg total) by mouth 2 (two) times daily.  90 tablet  6  . linagliptin (TRADJENTA) 5 MG TABS tablet Take 5 mg by mouth daily as needed. For blood sugar      . Melatonin 10 MG CAPS Take 5 mg by mouth at bedtime. For Sleep      . Multiple Vitamins-Minerals (PRESERVISION/LUTEIN) CAPS Take 1 capsule by mouth daily.     0  . nitroGLYCERIN (NITROSTAT) 0.4 MG SL tablet Place 1 tablet (0.4 mg total) under the tongue every 5 (five) minutes as needed. For chest pain  25 tablet  3  . omeprazole (PRILOSEC) 20 MG capsule Take 20 mg by mouth daily.      [name] Glycol-Propyl Glycol (SYSTANE OP) Apply  to eye as directed. 2-4 times a day      . warfarin (COUMADIN) 4 MG tablet Take 4 mg by mouth every evening.        No current facility-administered medications on file prior to visit.    Allergies  Allergen Reactions  . Metformin And Related Nausea And Vomiting    Past Medical History  Diagnosis Date  . Coronary artery disease     s/p PCI to LAD 08/05/10  . Hypertension   . Diabetes mellitus   . Hyperlipidemia   . Barrett esophagus   . Diabetes mellitus   . Gallstone pancreatitis 2011  . Nephrolithiasis   . SBO (small bowel obstruction) 2005  . Colon cancer   . Macrocytic anemia 06/25/2011  . Atrial flutter with rapid ventricular response 07/18/11    failed TEE due to inability to pass probe into the esophagus in March 2013; now managed with anticoagulation and rate control  . Chronic anticoagulation   . Colon cancer   . Colon cancer metastasized to mesenteric lymph nodes 06/16/2012    Past Surgical  History  Procedure Laterality Date  . Cholecystectomy  2011  . Right colectomy    . Coronary angioplasty with stent placement  08/05/10    DES to the LAD  . Cardiac catheterization  08/03/10  . Cardiac catheterization  08/09/10    Stent patent  . Abdominal mass resection      Mass near mesentery  . Catracts      bileral removal both eyes    History  Smoking status  . Former Smoker  . Quit date: 05/10/1994  Smokeless tobacco  . Not on file    History  Alcohol Use No    Family History  Problem Relation Age of Onset  . Cancer Mother   . Ulcers Father     Reviw of Systems:  Reviewed in the HPI.  All other systems are negative.  Physical Exam: Blood pressure 110/60, pulse 79, height 5\' 9"  (1.753 m), weight 166 lb 1.9 oz (75.352 kg), SpO2 98.00%. The patient is alert and oriented x 3. The mood and affect are normal.  Skin: warm and dry. Color is normal.  HEENT: he has 2+carotids. He is no JVD.  Lungs: His lungs are clear to auscultation.  Heart: irregularly irregular S1-S2.  Abdomen: His abdominal exam is benign.  Extremities:  no clubbing cyanosis or edema.  Neuro: Nonfocal  Psych:  Responds to questions appropriately with a normal affect.  ECG:  Assessment / Plan:

## 2012-10-03 NOTE — Assessment & Plan Note (Signed)
[name] seems  to be stable. I do not necessarily think that his abdominal pain is due to angina. I have asked him to let you know if this pain seems to be related to exertion.

## 2012-10-03 NOTE — Assessment & Plan Note (Signed)
Lipid Panel     Component Value Date/Time   CHOL 107 09/27/2012 0737   TRIG 60.0 09/27/2012 0737   HDL 36.60* 09/27/2012 0737   CHOLHDL 3 09/27/2012 0737   VLDL 12.0 09/27/2012 0737   LDLCALC 58 09/27/2012 0737   His values seem to be OK.

## 2012-12-13 ENCOUNTER — Observation Stay (HOSPITAL_COMMUNITY)
Admission: EM | Admit: 2012-12-13 | Discharge: 2012-12-15 | Disposition: A | Discharge status: Home / Self Care | Payer: Medicare Other | Attending: Internal Medicine | Admitting: Internal Medicine

## 2012-12-13 ENCOUNTER — Encounter (HOSPITAL_COMMUNITY): Payer: Self-pay | Admitting: Adult Health

## 2012-12-13 ENCOUNTER — Emergency Department (HOSPITAL_COMMUNITY): Payer: Medicare Other

## 2012-12-13 DIAGNOSIS — Z79899 Other long term (current) drug therapy: Secondary | ICD-10-CM | POA: Insufficient documentation

## 2012-12-13 DIAGNOSIS — Z7901 Long term (current) use of anticoagulants: Secondary | ICD-10-CM | POA: Insufficient documentation

## 2012-12-13 DIAGNOSIS — I4891 Unspecified atrial fibrillation: Secondary | ICD-10-CM | POA: Insufficient documentation

## 2012-12-13 DIAGNOSIS — Z9861 Coronary angioplasty status: Secondary | ICD-10-CM | POA: Insufficient documentation

## 2012-12-13 DIAGNOSIS — K2270 Barrett's esophagus without dysplasia: Secondary | ICD-10-CM | POA: Insufficient documentation

## 2012-12-13 DIAGNOSIS — I5023 Acute on chronic systolic (congestive) heart failure: Secondary | ICD-10-CM

## 2012-12-13 DIAGNOSIS — E785 Hyperlipidemia, unspecified: Secondary | ICD-10-CM | POA: Insufficient documentation

## 2012-12-13 DIAGNOSIS — I429 Cardiomyopathy, unspecified: Secondary | ICD-10-CM | POA: Diagnosis present

## 2012-12-13 DIAGNOSIS — I428 Other cardiomyopathies: Secondary | ICD-10-CM | POA: Insufficient documentation

## 2012-12-13 DIAGNOSIS — I2510 Atherosclerotic heart disease of native coronary artery without angina pectoris: Secondary | ICD-10-CM | POA: Diagnosis present

## 2012-12-13 DIAGNOSIS — Z85038 Personal history of other malignant neoplasm of large intestine: Secondary | ICD-10-CM | POA: Insufficient documentation

## 2012-12-13 DIAGNOSIS — I480 Paroxysmal atrial fibrillation: Secondary | ICD-10-CM | POA: Diagnosis present

## 2012-12-13 DIAGNOSIS — I10 Essential (primary) hypertension: Secondary | ICD-10-CM | POA: Diagnosis present

## 2012-12-13 DIAGNOSIS — R079 Chest pain, unspecified: Principal | ICD-10-CM | POA: Diagnosis present

## 2012-12-13 DIAGNOSIS — E119 Type 2 diabetes mellitus without complications: Secondary | ICD-10-CM

## 2012-12-13 LAB — BASIC METABOLIC PANEL
Calcium: 9.6 mg/dL (ref 8.4–10.5)
Creatinine, Ser: 1.12 mg/dL (ref 0.50–1.35)
GFR calc Af Amer: 66 mL/min — ABNORMAL LOW (ref >90)

## 2012-12-13 LAB — CBC
MCH: 35.5 pg — ABNORMAL HIGH (ref 26.0–34.0)
MCHC: 35.7 g/dL (ref 30.0–36.0)
MCV: 99.5 fL (ref 78.0–100.0)
Platelets: 162 10*3/uL (ref 150–400)
RDW: 12.9 % (ref 11.5–15.5)

## 2012-12-13 LAB — PROTIME-INR: Prothrombin Time: 27.8 seconds — ABNORMAL HIGH (ref 11.6–15.2)

## 2012-12-13 NOTE — ED Provider Notes (Signed)
CSN: [id number]     Arrival date & time 12/13/12  2105 History    79 year old white male presents emergency department chief complaint chest pain. Patient endorses 3 days of worsening chest pain that is intermittent in nature. The past 3 days he is taking his nitroglycerin tablets roughly 4 times per day. He states his chest pain is worse in the afternoons. He also takes aspirin powder with nitroglycerin which helps his symptoms. Currently he is pain-free. Patient states feels like an ache in the middle of his chest. This is best history of coronary artery disease and LAD stent in 2012. He also has hypertension, diabetes, Barrett's esophagitis, and is on Coumadin.  First MD Initiated Contact with Patient 12/13/12 2135     Chief Complaint  Patient presents with  . Chest Pain   (Consider location/radiation/quality/duration/timing/severity/associated sxs/prior Treatment) HPI  Past Medical History  Diagnosis Date  . Coronary artery disease     s/p PCI to LAD 08/05/10  . Hypertension   . Diabetes mellitus   . Hyperlipidemia   . Barrett esophagus   . Diabetes mellitus   . Gallstone pancreatitis 2011  . Nephrolithiasis   . SBO (small bowel obstruction) 2005  . Colon cancer   . Macrocytic anemia 06/25/2011  . Atrial flutter with rapid ventricular response 07/18/11    failed TEE due to inability to pass probe into the esophagus in March 2013; now managed with anticoagulation and rate control  . Chronic anticoagulation   . Colon cancer   . 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 Cholecystectomy  2011  . Right colectomy    . Coronary angioplasty with stent placement  08/05/10    DES to the LAD  . Cardiac catheterization  08/03/10  . Cardiac catheterization  08/09/10    Stent patent  . Abdominal mass resection      Mass near mesentery  . Catracts      bileral removal both eyes   Family History  Problem Relation Age  of Onset  . Cancer Mother   . Ulcers Father    History  Substance Use Topics  . Smoking status: Former Smoker    Quit date: 05/10/1994  . Smokeless tobacco: Not on file  . Alcohol Use: No    Review of Systems  Constitutional: Negative for chills, activity change and appetite change.  HENT: Negative.   Respiratory: Positive for cough and chest tightness. Negative for apnea, choking, shortness of breath, wheezing and stridor.   Cardiovascular: Positive for chest pain and palpitations. Negative for leg swelling.       This is a history of irregular heartbeat since he was a young man in high school.  Endocrine: Negative.   Genitourinary: Positive for frequency. Negative for dysuria, flank pain, enuresis and difficulty urinating.  Allergic/Immunologic: Negative.   Neurological: Negative.     Allergies  Metformin and related  Home Medications   No current outpatient prescriptions on file. BP 164/81  Pulse 63  Temp(Src) 97.9 F (36.6 C) (Oral)  Resp 20  Wt 163 lb 12.8 oz (74.299 kg)  BMI 24.18 kg/m2  SpO2 91% Physical Exam  Constitutional: He appears well-developed and well-nourished.  HENT:  Head: Normocephalic and atraumatic.  Eyes: Conjunctivae are normal. Pupils are equal, round, and reactive to light.  Neck: Normal range of motion. Neck supple. No JVD present. No tracheal deviation present. No thyromegaly present.  Cardiovascular:  Murmur heard. Heart  sounds: Irregular rhythm  Pulmonary/Chest: Effort normal. No stridor. No respiratory distress. He has wheezes.  Lungs normal respiratory effort, normal rise and fall, bibasilar left greater than right wheezes minimal  Musculoskeletal: Normal range of motion.  Neurological: He is alert.  Skin: Skin is warm and dry.    ED Course   Procedures (including critical care time)  Labs Reviewed  CBC - Abnormal; Notable for the following:    RBC 4.03 (*)    MCH 35.5 (*)    All other components within normal limits   BASIC METABOLIC PANEL - Abnormal; Notable for the following:    Sodium 134 (*)    Glucose, Bld 191 (*)    BUN 24 (*)    GFR calc non Af Amer 57 (*)    GFR calc Af Amer 66 (*)    All other components within normal limits  PROTIME-INR - Abnormal; Notable for the following:    Prothrombin Time 27.8 (*)    INR 2.71 (*)    All other components within normal limits  POCT I-STAT TROPONIN I   Dg Chest 2 View  12/13/2012   *RADIOLOGY REPORT*  Clinical Data: Pain  CHEST - 2 VIEW  Comparison: 03/10/2012  Findings: Stable cardiomegaly with extensive emphysema and fibrotic scarring throughout the lungs.  No definite superimposed edema or pneumonia.  No significant effusion or pneumothorax.  Trachea midline.  Atherosclerosis of the aorta.  Prior cholecystectomy noted.  IMPRESSION: Stable advanced emphysema and fibrotic changes.  Cardiomegaly without CHF or pneumonia.  No interval change.   Original Report Authenticated By: Judie Petit. Miles Costain, M.D.    Date: 12/13/2012  Rate: 78  Rhythm: indeterminate  QRS Axis: normal  Intervals: no pr  ST/T Wave abnormalities: nonspecific ST changes  Conduction Disutrbances:right bundle branch block  Narrative Interpretation:   Old EKG Reviewed: unchanged     1. Chest pain   2. Atrial fibrillation   3. Coronary artery disease   4. Diabetes mellitus   5. Hyperlipidemia      MDM  79 year old white male with coronary artery disease, diabetes, hypertension, afib on coumadin, hyperlipidemia presents with 3 day history of worsening chest pain which improves with nitroglycerin and aspirin. Patient has used aspirin and nitroglycerin 3 times per day for the past three days. Initial troponins negative. EKG is unchanged however reveals a right bundle branch block with PVCs and nonspecific changes. Patient has been pain-free and stable in the ER since arrival. Consult to Dr. Julian Reil internal medicine for admission. Plan for patient to be admitted to telemetry.    Darlys Gales, MD 12/14/12 [phone]

## 2012-12-13 NOTE — ED Notes (Signed)
Pt. Continues to become bradycardic in the 30s. Dr. Redgie Grayer informed. Pt. Alert and oriented. Asymptomatic. Denies complaints at this time.

## 2012-12-13 NOTE — ED Notes (Signed)
Presents with chest pain that began today described as pressure on the left side of the chest associated with SOB and dizziness,  denies nausea, denies diaphoresis. Pt has taken more nitro today with little relief of chest pressure. Reports the nitro helps for a short time but pressure returns. Nothing makes pain worse.

## 2012-12-13 NOTE — ED Notes (Signed)
Admitting physician at bedside

## 2012-12-14 ENCOUNTER — Encounter (HOSPITAL_COMMUNITY): Payer: Self-pay | Admitting: Nurse Practitioner

## 2012-12-14 DIAGNOSIS — E785 Hyperlipidemia, unspecified: Secondary | ICD-10-CM

## 2012-12-14 DIAGNOSIS — R079 Chest pain, unspecified: Principal | ICD-10-CM

## 2012-12-14 DIAGNOSIS — I5023 Acute on chronic systolic (congestive) heart failure: Secondary | ICD-10-CM

## 2012-12-14 DIAGNOSIS — I4891 Unspecified atrial fibrillation: Secondary | ICD-10-CM

## 2012-12-14 DIAGNOSIS — E119 Type 2 diabetes mellitus without complications: Secondary | ICD-10-CM

## 2012-12-14 DIAGNOSIS — I2510 Atherosclerotic heart disease of native coronary artery without angina pectoris: Secondary | ICD-10-CM

## 2012-12-14 LAB — TROPONIN I
Troponin I: <0.3 ng/mL (ref <0.30)
Troponin I: <0.3 ng/mL (ref <0.30)

## 2012-12-14 LAB — GLUCOSE, CAPILLARY: Glucose-Capillary: 151 mg/dL — ABNORMAL HIGH (ref 70–99)

## 2012-12-14 MED ORDER — MELATONIN 10 MG PO CAPS: 5.0000 mg | ORAL_CAPSULE | Freq: Every day | ORAL | Status: DC

## 2012-12-14 MED ORDER — INSULIN ASPART 100 UNIT/ML ~~LOC~~ SOLN
0.0000 [IU] | SUBCUTANEOUS | Status: DC
  Administered 2012-12-14: 2 [IU] via SUBCUTANEOUS

## 2012-12-14 MED ORDER — NITROGLYCERIN 0.4 MG SL SUBL: 0.4000 mg | SUBLINGUAL_TABLET | SUBLINGUAL | Status: DC | PRN

## 2012-12-14 MED ORDER — INSULIN ASPART 100 UNIT/ML ~~LOC~~ SOLN
0.0000 [IU] | Freq: Three times a day (TID) | SUBCUTANEOUS | Status: DC
  Administered 2012-12-14 (×2): 3 [IU] via SUBCUTANEOUS
  Administered 2012-12-15: 5 [IU] via SUBCUTANEOUS

## 2012-12-14 MED ORDER — ISOSORBIDE MONONITRATE ER 60 MG PO TB24
90.0000 mg | ORAL_TABLET | Freq: Two times a day (BID) | ORAL | Status: DC
  Administered 2012-12-14 – 2012-12-15 (×4): 90 mg via ORAL
  Filled 2012-12-14 (×5): qty 1

## 2012-12-14 MED ORDER — WARFARIN SODIUM 4 MG PO TABS
4.0000 mg | ORAL_TABLET | Freq: Every day | ORAL | Status: DC
  Administered 2012-12-14: 4 mg via ORAL
  Filled 2012-12-14 (×2): qty 1

## 2012-12-14 MED ORDER — REGADENOSON 0.4 MG/5ML IV SOLN
0.4000 mg | Freq: Once | INTRAVENOUS | Status: AC
  Administered 2012-12-15: 0.4 mg via INTRAVENOUS
  Filled 2012-12-14: qty 5

## 2012-12-14 MED ORDER — WARFARIN - PHARMACIST DOSING INPATIENT
Freq: Every day | Status: DC
  Administered 2012-12-15: 18:00:00

## 2012-12-14 MED ORDER — ONDANSETRON HCL 4 MG/2ML IJ SOLN: 4.0000 mg | Freq: Four times a day (QID) | INTRAMUSCULAR | Status: DC | PRN

## 2012-12-14 MED ORDER — ASPIRIN EC 81 MG PO TBEC
81.0000 mg | DELAYED_RELEASE_TABLET | Freq: Every day | ORAL | Status: DC
  Administered 2012-12-14 – 2012-12-15 (×2): 81 mg via ORAL
  Filled 2012-12-14 (×2): qty 1

## 2012-12-14 MED ORDER — ATORVASTATIN CALCIUM 40 MG PO TABS
40.0000 mg | ORAL_TABLET | Freq: Every day | ORAL | Status: DC
  Administered 2012-12-14 – 2012-12-15 (×2): 40 mg via ORAL
  Filled 2012-12-14 (×2): qty 1

## 2012-12-14 MED ORDER — ACETAMINOPHEN 325 MG PO TABS: 650.0000 mg | ORAL_TABLET | ORAL | Status: DC | PRN

## 2012-12-14 MED ORDER — PANTOPRAZOLE SODIUM 40 MG PO TBEC
40.0000 mg | DELAYED_RELEASE_TABLET | Freq: Every day | ORAL | Status: DC
  Administered 2012-12-14 – 2012-12-15 (×2): 40 mg via ORAL
  Filled 2012-12-14 (×2): qty 1

## 2012-12-14 NOTE — Progress Notes (Signed)
ANTICOAGULATION CONSULT NOTE - Initial Consult  Pharmacy Consult for coumadin Indication: atrial fibrillation  Allergies  Allergen Reactions  . Metformin And Related Nausea And Vomiting    Patient Measurements: Weight: 163 lb 12.8 oz (74.299 kg)   Vital Signs: Temp: 97.9 F (36.6 C) (08/07 0016) Temp src: Oral (08/07 0016) BP: 164/81 mmHg (08/07 0016) Pulse Rate: 63 (08/07 0016)  Labs:  Recent Labs  12/13/12 2117  HGB 14.3  HCT 40.1  PLT 162  LABPROT 27.8*  INR 2.71*  CREATININE 1.12    The CrCl is unknown because both a height and weight (above a minimum accepted value) are required for this calculation.   Medical History: Past Medical History  Diagnosis Date  . Coronary artery disease     s/p PCI to LAD 08/05/10  . Hypertension   . Diabetes mellitus   . Hyperlipidemia   . Barrett esophagus   . Diabetes mellitus   . Gallstone pancreatitis 2011  . Nephrolithiasis   . SBO (small bowel obstruction) 2005  . Colon cancer   . Macrocytic anemia 06/25/2011  . Atrial flutter with rapid ventricular response 07/18/11    failed TEE due to inability to pass probe into the esophagus in March 2013; now managed with anticoagulation and rate control  . Chronic anticoagulation   . Colon cancer   . Colon cancer metastasized to mesenteric lymph nodes 06/16/2012    Medications:  Prescriptions prior to admission  Medication Sig Dispense Refill  . aspirin EC 81 MG tablet Take 81 mg by mouth daily.      [name] atorvastatin (LIPITOR) 40 MG tablet Take 1 tablet (40 mg total) by mouth daily.  30 tablet  6  . Cholecalciferol (VITAMIN D-3 PO) Take 1 tablet by mouth daily.       [name] glimepiride (AMARYL) 4 MG tablet Take 4 mg by mouth daily after breakfast.       . isosorbide mononitrate (IMDUR) 60 MG 24 hr tablet Take 1.5 tablets (90 mg total) by mouth 2 (two) times daily.  90 tablet  6  . linagliptin (TRADJENTA) 5 MG TABS tablet Take 5 mg by mouth daily. For blood sugar      . Melatonin  10 MG CAPS Take 5 mg by mouth at bedtime. For Sleep      . Multiple Vitamins-Minerals (PRESERVISION/LUTEIN) CAPS Take 1 capsule by mouth daily.     0  . nitroGLYCERIN (NITROSTAT) 0.4 MG SL tablet Place 1 tablet (0.4 mg total) under the tongue every 5 (five) minutes as needed. For chest pain  25 tablet  3  . omeprazole (PRILOSEC) 20 MG capsule Take 20 mg by mouth daily.      [name] Glycol-Propyl Glycol (SYSTANE OP) Place 1 drop into both eyes as directed. 2-4 times a day      . warfarin (COUMADIN) 4 MG tablet Take 4 mg by mouth every evening.         Assessment: 79 yo man admitted with chest pain to continue coumadin for afib.  Hg 14.3, PTLC 162.  Admission INR 2.71.  Home dose coumadin 4 mg daily with last dose 8/5. Goal of Therapy:  INR 2-3 Monitor platelets by anticoagulation protocol: Yes   Plan:  Continue coumadin 4 mg daily Check daily PT/INR for now. F/u plans for cath vs stress test.  [name] 12/14/2012,1:10 AM

## 2012-12-14 NOTE — H&P (Signed)
[facility] History and Physical  TAKESHI TEASDALE ZOX:[id number] DOB: 1923/06/01 DOA: 12/13/2012  Referring physician: ED PCP: Lillia Mountain, MD  Specialists: [facility]  Chief Complaint: Chest pain  HPI: [name] is a 79 y.o. male pmh including CAD s/p stent in 2012 (last cath/ stress test was in 2012), DM2, HTN, who presents to the ED with 3 day history of L sided chest pain.  Chest pain episodes associated with SOB and dizziness, relieved by nitro, nothing seems to make the pain worse.  He had been feeling well and chest pain free for the past 2 years until 3 days ago.  In the ED EKG shows A.Fib (chronic and old), initial troponin negative, and he is chest pain free at this time.  Hospitalist has been asked to admit.  HEART score = 6  Review of Systems: 12 systems reviewed and otherwise negative.  Past Medical History  Diagnosis Date  . Coronary artery disease     s/p PCI to LAD 08/05/10  . Hypertension   . Diabetes mellitus   . Hyperlipidemia   . Barrett esophagus   . Diabetes mellitus   . Gallstone pancreatitis 2011  . Nephrolithiasis   . SBO (small bowel obstruction) 2005  . Colon cancer   . Macrocytic anemia 06/25/2011  . Atrial flutter with rapid ventricular response 07/18/11    failed TEE due to inability to pass probe into the esophagus in March 2013; now managed with anticoagulation and rate control  . Chronic anticoagulation   . Colon cancer   . 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 Cholecystectomy  2011  . Right colectomy    . Coronary angioplasty with stent placement  08/05/10    DES to the LAD  . Cardiac catheterization  08/03/10  . Cardiac catheterization  08/09/10    Stent patent  . Abdominal mass resection      Mass near mesentery  . Catracts      bileral removal both eyes   Social History:  reports that he quit smoking about 18 years ago. He does not have  any smokeless tobacco history on file. He reports that he does not drink alcohol or use illicit drugs.   Allergies  Allergen Reactions  . Metformin And Related Nausea And Vomiting    Family History  Problem Relation Age of Onset  . Cancer Mother   . Ulcers Father     Prior to Admission medications   Medication Sig Start Date End Date Taking? Authorizing Provider  aspirin EC 81 MG tablet Take 81 mg by mouth daily.   Yes Historical Provider, MD  atorvastatin (LIPITOR) 40 MG tablet Take 1 tablet (40 mg total) by mouth daily. 06/26/12  Yes Vesta Mixer, MD  Cholecalciferol (VITAMIN D-3 PO) Take 1 tablet by mouth daily.    Yes Historical Provider, MD  glimepiride (AMARYL) 4 MG tablet Take 4 mg by mouth daily after breakfast.    Yes Historical Provider, MD  isosorbide mononitrate (IMDUR) 60 MG 24 hr tablet Take 1.5 tablets (90 mg total) by mouth 2 (two) times daily. 05/30/12 05/30/13 Yes Rosalio Macadamia, NP  linagliptin (TRADJENTA) 5 MG TABS tablet Take 5 mg by mouth daily. For blood sugar   Yes Historical Provider, MD  Melatonin 10 MG CAPS Take 5 mg by mouth at bedtime. For Sleep   Yes Historical Provider, MD  Multiple Vitamins-Minerals (PRESERVISION/LUTEIN) CAPS Take 1  capsule by mouth daily.  08/13/10  Yes Roger Shelter, MD  nitroGLYCERIN (NITROSTAT) 0.4 MG SL tablet Place 1 tablet (0.4 mg total) under the tongue every 5 (five) minutes as needed. For chest pain 08/15/12  Yes Vesta Mixer, MD  omeprazole (PRILOSEC) 20 MG capsule Take 20 mg by mouth daily.   Yes Historical Provider, MD  Polyethyl Glycol-Propyl Glycol (SYSTANE OP) Place 1 drop into both eyes as directed. 2-4 times a day   Yes Historical Provider, MD  warfarin (COUMADIN) 4 MG tablet Take 4 mg by mouth every evening.    Yes Historical Provider, MD   Physical Exam: Filed Vitals:   12/14/12 0016  BP: 164/81  Pulse: 63  Temp: 97.9 F (36.6 C)  Resp: 20    General:  NAD, resting comfortably in bed Eyes: PEERLA EOMI ENT:  mucous membranes moist Neck: supple w/o JVD Cardiovascular: RRR w/o MRG Respiratory: CTA B Abdomen: soft, nt, nd, bs+ Skin: no rash nor lesion Musculoskeletal: MAE, full ROM all 4 extremities Psychiatric: normal tone and affect Neurologic: AAOx3, grossly non-focal  Labs on Admission:  Basic Metabolic Panel:  Recent Labs Lab 12/13/12 2117  NA 134*  K 5.0  CL 102  CO2 24  GLUCOSE 191*  BUN 24*  CREATININE 1.12  CALCIUM 9.6   Liver Function Tests: No results found for this basename: AST, ALT, ALKPHOS, BILITOT, PROT, ALBUMIN,  in the last 168 hours No results found for this basename: LIPASE, AMYLASE,  in the last 168 hours No results found for this basename: AMMONIA,  in the last 168 hours CBC:  Recent Labs Lab 12/13/12 2117  WBC 5.6  HGB 14.3  HCT 40.1  MCV 99.5  PLT 162   Cardiac Enzymes: No results found for this basename: CKTOTAL, CKMB, CKMBINDEX, TROPONINI,  in the last 168 hours  BNP (last 3 results) No results found for this basename: PROBNP,  in the last 8760 hours CBG: No results found for this basename: GLUCAP,  in the last 168 hours  Radiological Exams on Admission: Dg Chest 2 View  12/13/2012   *RADIOLOGY REPORT*  Clinical Data: Pain  CHEST - 2 VIEW  Comparison: 03/10/2012  Findings: Stable cardiomegaly with extensive emphysema and fibrotic scarring throughout the lungs.  No definite superimposed edema or pneumonia.  No significant effusion or pneumothorax.  Trachea midline.  Atherosclerosis of the aorta.  Prior cholecystectomy noted.  IMPRESSION: Stable advanced emphysema and fibrotic changes.  Cardiomegaly without CHF or pneumonia.  No interval change.   Original Report Authenticated By: Judie Petit. Miles Costain, M.D.    EKG: Independently reviewed. RBBB and an indeterminate rhythm (in and out of A.Fib while here), the indeterminate rhythm where he has 2 beats and a pause is old per patient who states that he has had this for many decades.  Assessment/Plan Principal  Problem:   Chest pain Active Problems:   Hypertension   Diabetes mellitus   Coronary artery disease   1. Chest pain - admitting to tele, serial troponins ordered, CP resolved at this time, patients HEART score is at least a 6, will likely require cardiac rule out with either stress test or heart cath TBD by cardiology. 2. DM - holding home meds and putting on q4h SSI as patient is NPO for possible testing tomorrow 3. CAD - S/P stent in 2012, his last stress test was also in 2012, no testing since that time 4. HTN - continue home meds 5. Hyperlipidemia - continue home meds.  Code Status: Full Code (must indicate code status--if unknown or must be presumed, indicate so) Family Communication: No family in room (indicate person spoken with, if applicable, with phone number if by telephone) Disposition Plan: Admit to inpatient (indicate anticipated LOS)  Time spent: 70 min  Katalina Magri M. [facility] Pager [phone]  If 7PM-7AM, please contact night-coverage [email or web address] Password [facility] 12/14/2012, 12:42 AM

## 2012-12-14 NOTE — Progress Notes (Signed)
Assessment/Plan: Principal Problem:   Chest pain - this is certainly suspicious for unstable angina. The only thing that is lacking is that he cannot remember his prestent symptoms and is not sure this is the same pain. I have notified cardiology (for Dr. Elease Hashimoto) and they will see him today.  Active Problems:   Hypertension   Diabetes mellitus   Coronary artery disease   Atrial fibrillation - on Coumadin chronically.    Subjective: Feels fine this morning. Has had no discomfort overnight.   Objective:  Vital Signs: Filed Vitals:   12/13/12 2300 12/13/12 2330 12/14/12 0016 12/14/12 0447  BP: 118/54 136/33 164/81 153/62  Pulse: 89 50 63 87  Temp:   97.9 F (36.6 C) 97.8 F (36.6 C)  TempSrc:   Oral Oral  Resp: 15 18 20 18   Height:   5\' 9"  (1.753 m)   Weight:   74.299 kg (163 lb 12.8 oz)   SpO2: 98% 98% 91% 94%     EXAM: alert and oriented   Intake/Output Summary (Last 24 hours) at 12/14/12 0757 Last data filed at 12/14/12 0448  Gross per 24 hour  Intake      0 ml  Output   1225 ml  Net  -1225 ml    Lab Results:  Recent Labs  12/13/12 2117  NA 134*  K 5.0  CL 102  CO2 24  GLUCOSE 191*  BUN 24*  CREATININE 1.12  CALCIUM 9.6   No results found for this basename: AST, ALT, ALKPHOS, BILITOT, PROT, ALBUMIN,  in the last 72 hours No results found for this basename: LIPASE, AMYLASE,  in the last 72 hours  Recent Labs  12/13/12 2117  WBC 5.6  HGB 14.3  HCT 40.1  MCV 99.5  PLT 162    Recent Labs  12/14/12 0210  TROPONINI <0.30   No components found with this basename: POCBNP,  No results found for this basename: DDIMER,  in the last 72 hours No results found for this basename: HGBA1C,  in the last 72 hours No results found for this basename: CHOL, HDL, LDLCALC, TRIG, CHOLHDL, LDLDIRECT,  in the last 72 hours No results found for this basename: TSH, T4TOTAL, FREET3, T3FREE, THYROIDAB,  in the last 72 hours No results found for this basename:  VITAMINB12, FOLATE, FERRITIN, TIBC, IRON, RETICCTPCT,  in the last 72 hours  Studies/Results: Dg Chest 2 View  12/13/2012   *RADIOLOGY REPORT*  Clinical Data: Pain  CHEST - 2 VIEW  Comparison: 03/10/2012  Findings: Stable cardiomegaly with extensive emphysema and fibrotic scarring throughout the lungs.  No definite superimposed edema or pneumonia.  No significant effusion or pneumothorax.  Trachea midline.  Atherosclerosis of the aorta.  Prior cholecystectomy noted.  IMPRESSION: Stable advanced emphysema and fibrotic changes.  Cardiomegaly without CHF or pneumonia.  No interval change.   Original Report Authenticated By: Judie Petit. Miles Costain, M.D.   Medications: Medications administered in the last 24 hours reviewed.  Current Medication List reviewed.    LOS: 1 day   [facility] Internal Medicine @ [facility] [phone]) 12/14/2012, 7:57 AM

## 2012-12-14 NOTE — Consult Note (Signed)
CARDIOLOGY CONSULT NOTE   Patient ID: [name], male DOB: [birth year], 79 y.o. MRN: [id number]  Patient ID: [name]  MRN: [id number]  DOB/AGE: [birth year] 79 y.o.   Admit date: 12/13/2012  Primary Physician Lillia Mountain, MD  Primary Cardiologist Dr. Elease Hashimoto   Reason for Consultation A fib   HPI: [name] is a 79 y.o. male with hx of CAD s/p stent in 2012 (negative stress 2013), DM2, HTN, who presents to the ED with 3 day history of mid-sternal 5/10 chest pressure that would occur at rest. He had associated dyspnea, fatigue and lightheadedness, but denies N&V, diaphoresis, palpitations, fever, weakness, and headaches. Position changes, exertional and deep breathing did not make the pain worse. Nitro x would relieve the pain within 20 minutes. Once he felt some radiation of pain to left shoulder. Has not had chest pain like this before. Each day would note that pain would occur around 4 o'clock in the afternoon.  He reports history of 40 yr pack history (quit in 1980s), and history of alcohol use, but does not drink now. He is retired and is active in his garden, walking in the park, and going out for breakfast with his buddies without chest pain. Has a brother who died of heart attack at 72 years. Was admitted last night to hospital with INR 2.71, troponin negative, RBBB on EKG, and chest X ray unremarkable for CHF or pneumonia.   Code Status: Full Code (must indicate code status--if unknown or must be presumed, indicate so)  Family Communication: No family in room (indicate person spoken with, if applicable, with phone number if by telephone)  Disposition Plan: Admit to inpatient (indicate anticipated LOS)   Past Medical History   Diagnosis  Date   .  Coronary artery disease      s/p PCI to LAD 08/05/10   .  Hypertension    .  Diabetes mellitus    .  Hyperlipidemia    .  Barrett esophagus    .  Diabetes mellitus    .  Gallstone pancreatitis  2011   .   Nephrolithiasis    .  SBO (small bowel obstruction)  2005   .  Colon cancer    .  Macrocytic anemia  06/25/2011   .  Atrial flutter with rapid ventricular response  07/18/11     failed TEE due to inability to pass probe into the esophagus in March 2013; now managed with anticoagulation and rate control   .  Chronic anticoagulation    .  Colon cancer    .  Colon cancer metastasized to mesenteric lymph nodes  06/16/2012    Past Surgical History   Procedure  Laterality  Date   .  Cholecystectomy   2011   .  Right colectomy     .  Coronary angioplasty with stent placement   08/05/10     DES to the LAD   .  Cardiac catheterization   08/03/10   .  Cardiac catheterization   08/09/10     LAD 30, stent OK, CFX 40, RCA < 20  .  Abdominal mass resection       Mass near mesentery   .  Catracts       S/p bilateral removal    Allergies   Allergen  Reactions   .  Metformin And Related  Nausea And Vomiting    I have reviewed the patient's current medications  .  aspirin EC  81 mg  Oral  Daily   .  atorvastatin  40 mg  Oral  Daily   .  insulin aspart  0-15 Units  Subcutaneous  Q4H   .  isosorbide mononitrate  90 mg  Oral  BID   .  pantoprazole  40 mg  Oral  Daily   .  warfarin  4 mg  Oral  q1800   .  Warfarin - Pharmacist Dosing Inpatient   Does not apply  q1800    acetaminophen, nitroGLYCERIN, ondansetron (ZOFRAN) IV   Medication  Sig   aspirin EC 81 MG tablet  Take 81 mg by mouth daily.   atorvastatin (LIPITOR) 40 MG tablet  Take 1 tablet (40 mg total) by mouth daily.   Cholecalciferol (VITAMIN D-3 PO)  Take 1 tablet by mouth daily.   glimepiride (AMARYL) 4 MG tablet  Take 4 mg by mouth daily after breakfast.   isosorbide mononitrate (IMDUR) 60 MG 24 hr tablet  Take 1.5 tablets (90 mg total) by mouth 2 (two) times daily.   linagliptin (TRADJENTA) 5 MG TABS tablet  Take 5 mg by mouth daily. For blood sugar   Melatonin 10 MG CAPS  Take 5 mg by mouth at bedtime. For Sleep   Multiple  Vitamins-Minerals (PRESERVISION/LUTEIN) CAPS  Take 1 capsule by mouth daily.   nitroGLYCERIN (NITROSTAT) 0.4 MG SL tablet  Place 1 tablet (0.4 mg total) under the tongue every 5 (five) minutes as needed. For chest pain   omeprazole (PRILOSEC) 20 MG capsule  Take 20 mg by mouth daily.   Polyethyl Glycol-Propyl Glycol (SYSTANE OP)  Place 1 drop into both eyes as directed. 2-4 times a day   warfarin (COUMADIN) 4 MG tablet  Take 4 mg by mouth every evening.    History    Social History   .  Marital Status:  Single     Spouse Name:  N/A     Number of Children:  N/A   .  Years of Education:  N/A    Occupational History   .  Not on file.    Social History Main Topics   .  Smoking status:  Former Smoker     Quit date:  05/10/1994   .  Smokeless tobacco:  Not on file   .  Alcohol Use:  No   .  Drug Use:  No   .  Sexually Active:  No    Other Topics  Concern   .  Not on file    Social History Narrative   .  No narrative on file    Family Status   Relation  Status  Death Age   .  Mother  Deceased    .  Father  Deceased     Family History   Problem  Relation  Age of Onset   .  Cancer  Mother    .  Ulcers  Father     ROS: Full 14 point review of systems complete and found to be negative unless listed above.   Physical Exam:  Blood pressure 153/62, pulse 87, temperature 97.8 F (36.6 C), temperature source Oral, resp. rate 18, height 5\' 9"  (1.753 m), weight 163 lb 12.8 oz (74.299 kg), SpO2 94.00%.  General: Well developed, well nourished, male in no acute distress  Head: Eyes PERRLA, No xanthomas. Normocephalic and atraumatic, oropharynx without edema or exudate. Dentition: good  Lungs:Relatively clear to auscultation. Heart:  Irregular rate and rhythm. No murmur was auscultated.  pulses are 2+ extrem.  Neck: No carotid bruits. No lymphadenopathy. JVD at 8 cm Abdomen: Bowel sounds present, abdomen soft and non-tender without masses or hernias noted.  Msk:  no joint deformities or  effusions.  Extremities: No clubbing or cyanosis or edema Neuro: Alert and oriented X 3. Patient a little forgetful on some questioning. Psych: Good affect Skin: No rashes or lesions noted.   Labs:  Lab Results   Component  Value  Date    WBC  5.6  12/13/2012    HGB  14.3  12/13/2012    HCT  40.1  12/13/2012    MCV  99.5  12/13/2012    PLT  162  12/13/2012   Recent Labs   12/14/12 0520   INR  2.53*   Recent Labs  Lab  12/13/12 2117   NA  134*   K  5.0   CL  102   CO2  24   BUN  24*   CREATININE  1.12   CALCIUM  9.6   GLUCOSE  191*   Recent Labs   12/14/12 0210   TROPONINI  <0.30   Recent Labs   12/13/12 2124   TROPIPOC  0.01    Pro B Natriuretic peptide (BNP)   Date/Time  Value  Range  Status   07/16/2011 11:50 PM  360.6  0 - 450 pg/mL  Final   08/03/2010 4:00 AM  99.0  0.0 - 100.0 pg/mL  Final    Lab Results   Component  Value  Date    CHOL  107  09/27/2012    HDL  36.60*  09/27/2012    LDLCALC  58  09/27/2012    TRIG  60.0  09/27/2012    Lab Results   Component  Value  Date    DDIMER  0.67*  07/17/2011    Lipase   Date/Time  Value  Range  Status   03/09/2012 10:12 PM  40  11 - 59 U/L  Final    TSH   Date/Time  Value  Range  Status   07/17/2011 2:04 AM  1.704  0.350 - 4.500 uIU/mL  Final    Vitamin B-12   Date/Time  Value  Range  Status   12/01/2005 2:32 PM  315  211 - 911 pg/mL  Final      Folate   Date/Time  Value  Range  Status   12/01/2005 2:32 PM  >20.0   Final   Reference Ranges Deficient: 0.4 - 3.4 ng/mL Indeterminate: 3.4 - 5.4 ng/mL Normal: > 5.4 ng/mL      Ferritin   Date/Time  Value  Range  Status   02/02/2010 12:34 PM  100  22 - 322 ng/mL  Final      TIBC   Date/Time  Value  Range  Status   02/02/2010 12:34 PM  311  215 - 435 ug/dL  Final      Iron   Date/Time  Value  Range  Status   02/02/2010 12:34 PM  103  42 - 165 ug/dL  Final    Echo: 16/02/9603  Conclusions  - Left ventricle: The cavity size was normal. Wall thickness was normal.  Systolic function was moderately reduced. The estimated ejection fraction was in the range of 35% to 40%. Regional wall motion abnormalities cannot be excluded. The study is not technically sufficient to allow evaluation of LV diastolic function. - Aortic valve: There was mild to moderate stenosis. Valve area: 1.38cm^2(VTI). Valve  area: 1.52cm^2 (Vmax).   Tele: Sinus rhythm, Occasional bigeminy, frequent PVCs and underlying bradycardia. Patient heart rate fluctuated overnight with high being 80s and low 60s.     ECG: 12/13/2012  SR with PVCs in bigeminy  RBBB (old)  Vent. rate 78 BPM    Radiology: Dg Chest 2 View  12/13/2012 *RADIOLOGY REPORT* Clinical Data: Pain CHEST - 2 VIEW Comparison: 03/10/2012 Findings: Stable cardiomegaly with extensive emphysema and fibrotic scarring throughout the lungs. No definite superimposed edema or pneumonia. No significant effusion or pneumothorax. Trachea midline. Atherosclerosis of the aorta. Prior cholecystectomy noted. IMPRESSION: Stable advanced emphysema and fibrotic changes. Cardiomegaly without CHF or pneumonia. No interval change. Original Report Authenticated By: Judie Petit. Miles Costain, M.D.   Lexiscan Myoview: 12/23/2011  Impression  Exercise Capacity: Lexiscan with no exercise.  BP Response: Normal blood pressure response.  Clinical Symptoms: chest tightness  ECG Impression: No significant ST segment change suggestive of ischemia.  Comparison with Prior Nuclear Study: No images to compare  Overall Impression: Normal stress nuclear study.  LV Ejection Fraction: Study not gated. LV Wall Motion: Normal Wall Motion.   ASSESSMENT AND PLAN:  1. Chest pain  Patient's pain is atypical in that all spells have occurred around 4 PM each day and at rest.  He is a little bit of a diffiuclt historian  At one point he denies SOB with activity  Another time he says that he gives out much easier due to SOB than he had in the past.  Because of this discrepancy concern for  reliability in story  I would recomm a lexiscan myoview to r/o inducible ischemia.  Keep on same regimen  -     2. Hx LVD - volume status is good,  Not on BB with underlying sinus bradycardia.  Does have ectopy Follow  May add.   EF 35-40% by echo 2013. Was on lisinopril 5 mg in past but not on his current med list. Will add back at 2.5 mg daily and see how tolerated.  3/ HL  COntinue on statin  Good control  4.  History of AFib.  Currently in SR with PVCs  Continue tele  Continue anticoagulation.   Patient seen and examined  I have amended note by PA student to reflect my findings.  Dietrich Pates 6:05 PM 12/14/2012.

## 2012-12-15 ENCOUNTER — Observation Stay (HOSPITAL_COMMUNITY): Payer: Medicare Other

## 2012-12-15 DIAGNOSIS — R079 Chest pain, unspecified: Secondary | ICD-10-CM

## 2012-12-15 DIAGNOSIS — I428 Other cardiomyopathies: Secondary | ICD-10-CM

## 2012-12-15 DIAGNOSIS — I10 Essential (primary) hypertension: Secondary | ICD-10-CM

## 2012-12-15 LAB — GLUCOSE, CAPILLARY
Glucose-Capillary: 206 mg/dL — ABNORMAL HIGH (ref 70–99)
Glucose-Capillary: 217 mg/dL — ABNORMAL HIGH (ref 70–99)

## 2012-12-15 MED ORDER — TECHNETIUM TC 99M SESTAMIBI GENERIC - CARDIOLITE
30.0000 | Freq: Once | INTRAVENOUS | Status: AC | PRN
  Administered 2012-12-15: 30 via INTRAVENOUS

## 2012-12-15 MED ORDER — LISINOPRIL 2.5 MG PO TABS: 2.5000 mg | ORAL_TABLET | Freq: Every day | ORAL | Status: DC

## 2012-12-15 MED ORDER — TECHNETIUM TC 99M SESTAMIBI GENERIC - CARDIOLITE
10.0000 | Freq: Once | INTRAVENOUS | Status: AC | PRN
  Administered 2012-12-15: 10 via INTRAVENOUS

## 2012-12-15 MED ORDER — LISINOPRIL 2.5 MG PO TABS
2.5000 mg | ORAL_TABLET | Freq: Every day | ORAL | Status: DC
  Administered 2012-12-15: 2.5 mg via ORAL
  Filled 2012-12-15: qty 1

## 2012-12-15 MED ORDER — WARFARIN SODIUM 6 MG PO TABS
6.0000 mg | ORAL_TABLET | Freq: Once | ORAL | Status: AC
  Administered 2012-12-15: 6 mg via ORAL
  Filled 2012-12-15: qty 1

## 2012-12-15 NOTE — Progress Notes (Signed)
   TELEMETRY: Reviewed telemetry pt in NSR with very frequent PVCs: Filed Vitals:   12/14/12 1055 12/14/12 1350 12/14/12 2015 12/15/12 0400  BP: 157/61 135/51 135/50 151/59  Pulse: 91 91 70 82  Temp: 97.8 F (36.6 C) 98.2 F (36.8 C) 98.3 F (36.8 C) 97.7 F (36.5 C)  TempSrc: Oral Oral Oral Oral  Resp: 18 18 18 20   Height:      Weight:      SpO2: 95% 95% 96% 94%    Intake/Output Summary (Last 24 hours) at 12/15/12 0743 Last data filed at 12/14/12 1700  Gross per 24 hour  Intake    720 ml  Output    300 ml  Net    420 ml    SUBJECTIVE Denies any further chest pain. No SOB. States he has had skipping in his heart since he was in the [employer].  LABS: Basic Metabolic Panel:  Recent Labs  16/10/96 2117  NA 134*  K 5.0  CL 102  CO2 24  GLUCOSE 191*  BUN 24*  CREATININE 1.12  CALCIUM 9.6   CBC:  Recent Labs  12/13/12 2117  WBC 5.6  HGB 14.3  HCT 40.1  MCV 99.5  PLT 162   Cardiac Enzymes:  Recent Labs  12/14/12 0210 12/14/12 1046 12/14/12 1451  TROPONINI <0.30 <0.30 <0.30    Radiology/Studies:  Dg Chest 2 View  12/13/2012   *RADIOLOGY REPORT*  Clinical Data: Pain  CHEST - 2 VIEW  Comparison: 03/10/2012  Findings: Stable cardiomegaly with extensive emphysema and fibrotic scarring throughout the lungs.  No definite superimposed edema or pneumonia.  No significant effusion or pneumothorax.  Trachea midline.  Atherosclerosis of the aorta.  Prior cholecystectomy noted.  IMPRESSION: Stable advanced emphysema and fibrotic changes.  Cardiomegaly without CHF or pneumonia.  No interval change.   Original Report Authenticated By: Judie Petit. Shick, M.D.   Ecg: NSR with frequent PVCs, RBBB  PHYSICAL EXAM General: Well developed, well nourished, in no acute distress. Head: Normocephalic, atraumatic, sclera non-icteric, no xanthomas, nares are without discharge. Neck: Negative for carotid bruits. JVD not elevated. Lungs: Clear bilaterally to auscultation without wheezes,  rales, or rhonchi. Breathing is unlabored. Heart: RRR S1 S2 without murmurs, rubs, or gallops.  Abdomen: Soft, non-tender, non-distended with normoactive bowel sounds. No hepatomegaly. No rebound/guarding. No obvious abdominal masses. Msk:  Strength and tone appears normal for age. Extremities: No clubbing, cyanosis or edema.  Distal pedal pulses are 2+ and equal bilaterally. Neuro: Alert and oriented X 3. Moves all extremities spontaneously. Psych:  Responds to questions appropriately with a normal affect.  ASSESSMENT AND PLAN: 1. Chest pain with atypical features. Patient has ruled out for MI. Will proceed with Myoview study today. If negative could be DC home today. On statin Rx and Imdur. 2. CAD s/p stent LD 2012 3. PVCs chronic. Not a candidate for beta blocker due to bradycardia. 4. HTN - controlled on low dose lisinopril. 5. History of Afib/flutter. Now in NSR. On anticoagulation with coumadin. 6. LV dysfunction EF 35-40%.  Principal Problem:   Chest pain Active Problems:   Hypertension   Diabetes mellitus   Coronary artery disease   Atrial fibrillation    Signed, Peter [place] MD,FACC 12/15/2012 7:49 AM

## 2012-12-15 NOTE — Progress Notes (Signed)
ANTICOAGULATION CONSULT NOTE - Follow Up Consult  Pharmacy Consult for Coumadin Indication: atrial fibrillation  Allergies  Allergen Reactions  . Metformin And Related Nausea And Vomiting    Patient Measurements: Height: 5\' 9"  (175.3 cm) Weight: 163 lb 12.8 oz (74.299 kg) IBW/kg (Calculated) : 70.7  Vital Signs: Temp: 97.7 F (36.5 C) (08/08 0400) Temp src: Oral (08/08 0400) BP: 123/97 mmHg (08/08 0940) Pulse Rate: 78 (08/08 0940)  Labs:  Recent Labs  12/13/12 2117 12/14/12 0210 12/14/12 0520 12/14/12 1046 12/14/12 1451 12/15/12 0620  HGB 14.3  --   --   --   --   --   HCT 40.1  --   --   --   --   --   PLT 162  --   --   --   --   --   LABPROT 27.8*  --  26.4*  --   --  20.7*  INR 2.71*  --  2.53*  --   --  1.84*  CREATININE 1.12  --   --   --   --   --   TROPONINI  --  <0.30  --  <0.30 <0.30  --     Estimated Creatinine Clearance: 46.5 ml/min (by C-G formula based on Cr of 1.12).  Assessment: 89yom continues on coumadin for afib. INR is subtherapeutic today and has dropped (2.53-->1.84) on his home regimen. Will increase tonight's dose. No bleeding reported. For myoview today.  Goal of Therapy:  INR 2-3 Monitor platelets by anticoagulation protocol: Yes   Plan:  1) Increase coumadin to 6mg  x 1 tonight 2) INR in AM 3) Follow up myoview results  Fredrik Rigger 12/15/2012,9:46 AM

## 2012-12-15 NOTE — Care Management Note (Unsigned)
    Page 1 of 1   12/15/2012     3:38:40 PM   CARE MANAGEMENT NOTE 12/15/2012  Patient:  [name], [name]   Account Number:  [id number]  Date Initiated:  12/15/2012  Documentation initiated by:  Jill Ruppe  Subjective/Objective Assessment:   PT ADM ON 12/13/12 WITH CHEST PAIN.  PTA, PT INDEPENDENT, LIVES ALONE.     Action/Plan:   STRESS TEST PENDING.  POSS DC LATER TODAY.  WILL FOLLOW FOR DC NEEDS.   Anticipated DC Date:  12/15/2012   Anticipated DC Plan:  HOME/SELF CARE      DC Planning Services  CM consult      Choice offered to / List presented to:             Status of service:  In process, will continue to follow Medicare Important Message given?   (If response is "NO", the following Medicare IM given date fields will be blank) Date Medicare IM given:   Date Additional Medicare IM given:    Discharge Disposition:    Per UR Regulation:    If discussed at Long Length of Stay Meetings, dates discussed:    Comments:

## 2012-12-15 NOTE — Discharge Summary (Signed)
Physician Discharge Summary  NAME:[name]  ZOX:[id number]  DOB: 1923/11/12   Admit date: 12/13/2012 Discharge date: 12/15/2012  Admitting Diagnosis: Chest pain  Discharge Diagnoses:  Active Hospital Problems   Diagnosis Date Noted  . Chest pain 12/14/2012  . Atrial fibrillation 07/20/2011  . Cardiomyopathy 07/20/2011  . Coronary artery disease 08/13/2010  . Hypertension   . Diabetes mellitus     Resolved Hospital Problems   Diagnosis Date Noted Date Resolved  No resolved problems to display.    Discharge Condition: improved  Hospital Course: Patient admitted with pain worrisome for unstable angina. Seen by cardiology, he had lisinopril added due to depressed LV function. MI was ruled out. Myoview was negative so no further eval was recommended.   Consults: Cardiology  Disposition: 01-Home or Self Care  Discharge Orders   Future Appointments Provider Department Dept Phone   02/07/2013 4:00 PM Vesta Mixer, MD [facility]) [phone]   06/15/2013 4:00 PM Krista Blue [facility] MEDICAL ONCOLOGY [phone]   06/15/2013 4:30 PM Levert Feinstein, MD [facility] MEDICAL ONCOLOGY [phone]   Future Orders Complete By Expires     Diet - low sodium heart healthy  As directed     Discharge instructions  As directed     Comments:      Resume your warfarin (Coumadin) as before.    Increase activity slowly  As directed         Medication List         aspirin EC 81 MG tablet  Take 81 mg by mouth daily.     atorvastatin 40 MG tablet  Commonly known as:  LIPITOR  Take 1 tablet (40 mg total) by mouth daily.     glimepiride 4 MG tablet  Commonly known as:  AMARYL  Take 4 mg by mouth daily after breakfast.     isosorbide mononitrate 60 MG 24 hr tablet  Commonly known as:  IMDUR  Take 1.5 tablets (90 mg total) by mouth 2 (two) times daily.     linagliptin 5 MG Tabs tablet  Commonly known as:  TRADJENTA   Take 5 mg by mouth daily. For blood sugar     lisinopril 2.5 MG tablet  Commonly known as:  PRINIVIL,ZESTRIL  Take 1 tablet (2.5 mg total) by mouth daily.     Melatonin 10 MG Caps  Take 5 mg by mouth at bedtime. For Sleep     nitroGLYCERIN 0.4 MG SL tablet  Commonly known as:  NITROSTAT  Place 1 tablet (0.4 mg total) under the tongue every 5 (five) minutes as needed. For chest pain     omeprazole 20 MG capsule  Commonly known as:  PRILOSEC  Take 20 mg by mouth daily.     PRESERVISION/LUTEIN Caps  Take 1 capsule by mouth daily.     SYSTANE OP  Place 1 drop into both eyes as directed. 2-4 times a day     VITAMIN D-3 PO  Take 1 tablet by mouth daily.     warfarin 4 MG tablet  Commonly known as:  COUMADIN  Take 4 mg by mouth every evening.           Follow-up Information   Follow up with Elyn Aquas., MD On 02/07/2013. (at 4:00 pm.)    Contact information:   [address] [place] [address] [phone]       Follow up with [facility],  MD. (please come in on 12/28/12 as scheduled for PT)    Contact information:   [address], [address] AND [place] [address] [phone]       Things to follow up in the outpatient setting: resume INR measurements  Time coordinating discharge: 25 minutes including medication reconciliation, transmission of prescriptions to pharmacy, preparation of discharge papers, and discussion with patient     Signed: Darnelle Bos 12/15/2012, 5:10 PM

## 2012-12-15 NOTE — Progress Notes (Signed)
Assessment/Plan: Principal Problem:   Chest pain - for myoview today. If negative, could be discharged home. If positive, will need further testing. I have done d/c med reconciliation assuming that he will go home with no changes. Would appreciate call from cardiology if myoview is negative and I can discharge him (my phone is [phone]).  Active Problems:   Hypertension   Diabetes mellitus   Coronary artery disease   Atrial fibrillation   Subjective: No problems over night. Interesting additional hx obtained by Dr. Tenny Craw that pain is usually at same time every day.   Objective:  Vital Signs: Filed Vitals:   12/14/12 1055 12/14/12 1350 12/14/12 2015 12/15/12 0400  BP: 157/61 135/51 135/50 151/59  Pulse: 91 91 70 82  Temp: 97.8 F (36.6 C) 98.2 F (36.8 C) 98.3 F (36.8 C) 97.7 F (36.5 C)  TempSrc: Oral Oral Oral Oral  Resp: 18 18 18 20   Height:      Weight:      SpO2: 95% 95% 96% 94%     EXAM: alert, oriented. Comfortable.    Intake/Output Summary (Last 24 hours) at 12/15/12 0556 Last data filed at 12/14/12 1700  Gross per 24 hour  Intake    720 ml  Output    300 ml  Net    420 ml    Lab Results:  Recent Labs  12/13/12 2117  NA 134*  K 5.0  CL 102  CO2 24  GLUCOSE 191*  BUN 24*  CREATININE 1.12  CALCIUM 9.6   No results found for this basename: AST, ALT, ALKPHOS, BILITOT, PROT, ALBUMIN,  in the last 72 hours No results found for this basename: LIPASE, AMYLASE,  in the last 72 hours  Recent Labs  12/13/12 2117  WBC 5.6  HGB 14.3  HCT 40.1  MCV 99.5  PLT 162    Recent Labs  12/14/12 0210 12/14/12 1046 12/14/12 1451  TROPONINI <0.30 <0.30 <0.30   No components found with this basename: POCBNP,  No results found for this basename: DDIMER,  in the last 72 hours No results found for this basename: HGBA1C,  in the last 72 hours No results found for this basename: CHOL, HDL, LDLCALC, TRIG, CHOLHDL, LDLDIRECT,  in the last 72 hours No results  found for this basename: TSH, T4TOTAL, FREET3, T3FREE, THYROIDAB,  in the last 72 hours No results found for this basename: VITAMINB12, FOLATE, FERRITIN, TIBC, IRON, RETICCTPCT,  in the last 72 hours  Studies/Results: Dg Chest 2 View  12/13/2012   *RADIOLOGY REPORT*  Clinical Data: Pain  CHEST - 2 VIEW  Comparison: 03/10/2012  Findings: Stable cardiomegaly with extensive emphysema and fibrotic scarring throughout the lungs.  No definite superimposed edema or pneumonia.  No significant effusion or pneumothorax.  Trachea midline.  Atherosclerosis of the aorta.  Prior cholecystectomy noted.  IMPRESSION: Stable advanced emphysema and fibrotic changes.  Cardiomegaly without CHF or pneumonia.  No interval change.   Original Report Authenticated By: Judie Petit. Miles Costain, M.D.   Medications: Medications administered in the last 24 hours reviewed.  Current Medication List reviewed.    LOS: 2 days   [facility] Internal Medicine @ [facility] [phone]) 12/15/2012, 5:56 AM

## 2012-12-18 ENCOUNTER — Encounter (HOSPITAL_COMMUNITY): Payer: Medicare Other

## 2012-12-18 ENCOUNTER — Other Ambulatory Visit: Payer: Self-pay

## 2012-12-18 MED ORDER — NITROGLYCERIN 0.4 MG SL SUBL: 0.4000 mg | SUBLINGUAL_TABLET | SUBLINGUAL | Status: DC | PRN

## 2012-12-18 NOTE — Progress Notes (Signed)
Pt/family given discharge instructions, medication lists, follow up appointments, and when to call the doctor.  Pt/family verbalizes understanding. Pt given signs and symptoms of infection. [name]    

## 2013-01-17 ENCOUNTER — Other Ambulatory Visit: Payer: Self-pay | Admitting: *Deleted

## 2013-01-17 MED ORDER — ISOSORBIDE MONONITRATE ER 60 MG PO TB24: 90.0000 mg | ORAL_TABLET | Freq: Two times a day (BID) | ORAL | Status: DC

## 2013-01-25 ENCOUNTER — Other Ambulatory Visit: Payer: Self-pay

## 2013-01-25 MED ORDER — ATORVASTATIN CALCIUM 40 MG PO TABS: 40.0000 mg | ORAL_TABLET | Freq: Every day | ORAL | Status: DC

## 2013-02-07 ENCOUNTER — Encounter: Payer: Self-pay | Admitting: Cardiovascular Disease

## 2013-02-07 ENCOUNTER — Ambulatory Visit (INDEPENDENT_AMBULATORY_CARE_PROVIDER_SITE_OTHER): Payer: Medicare Other | Admitting: Cardiovascular Disease

## 2013-02-07 VITALS — BP 130/60 | HR 63 | Ht 69.0 in | Wt 168.0 lb

## 2013-02-07 DIAGNOSIS — I4891 Unspecified atrial fibrillation: Secondary | ICD-10-CM

## 2013-02-07 DIAGNOSIS — I2510 Atherosclerotic heart disease of native coronary artery without angina pectoris: Secondary | ICD-10-CM

## 2013-02-07 DIAGNOSIS — I10 Essential (primary) hypertension: Secondary | ICD-10-CM

## 2013-02-07 NOTE — Progress Notes (Signed)
[name] Date of Birth  1923-07-10       [facility]    [place] [address]  [address], [address] [place], [place]  [address]   [place], [place]  [address] [phone]     [phone]   Fax  [phone]    Fax [phone]  Problem List: 1. CAD - s/p stenting July 2012.  2. Hypertension  3. Hyperlipidemia  4. Diabetes Mellitus    History of Present Illness:  [name] is seen today for a 2 week check. He has known CAD with prior PCI to the LAD last year. Other issues include chronic PVC's and PACs, HTN, DM, HLD and systolic heart failure. He now has atrial fib and will be managed with rate control and anticoagulation. He had a failed attempt at TEE/CV in March  due to inability to pass the probe into the esophagus. His EF is 35 to 40% with mild to moderate AS.  His INR has been therapeutic. He really is not interested in doing cardioversion.  December 23, 2011 - he was initially seen in the emergency room. He has progressive back pain associated with increasing shortness of breath. [name] noticed that if he takes a nitroglycerin at this back pain and his shortness of breath improved. He has these discomforts with minor levels of exertion. He is also having at rest.  March 20, 2012: [name] presented with some episodes of chest pain when I last saw him in August. He had a stress Myoview study that was normal.  He still walks almost every day - he goes around [place] 1-1 1/2 miles a day.  He had a brief episode of CP yesterday - relieved with NTG.  He had some food poisoning recently ( raw oysters)  Oct 03, 2012:  He has been having some mid abdominal pain with radiation around to the back.  He took a NTG with relief.   The pain was a dull pain and would last 30 minutes or so.    He has been putting in his garden without any without any problems.    Oct. 1, 2014:  [name] is doing ok.  He is having problems with his memory.   Has trouble getting the words out sometimes.  No CVA symptoms.   No focal neuro findings.   No  CP or dyspnea.  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    Current Outpatient Prescriptions on File Prior to Visit  Medication Sig Dispense Refill  . aspirin EC 81 MG tablet Take 81 mg by mouth daily.      [name] atorvastatin (LIPITOR) 40 MG tablet Take 1 tablet (40 mg total) by mouth daily.  30 tablet  6  . Cholecalciferol (VITAMIN D-3 PO) Take 1 tablet by mouth daily.       [name] glimepiride (AMARYL) 4 MG tablet Take 4 mg by mouth daily after breakfast.       . isosorbide mononitrate (IMDUR) 60 MG 24 hr tablet Take 1.5 tablets (90 mg total) by mouth 2 (two) times daily.  90 tablet  6  . linagliptin (TRADJENTA) 5 MG TABS tablet Take 5 mg by mouth daily. For blood sugar      . Melatonin 10 MG CAPS Take 5 mg by mouth at bedtime. For Sleep      . Multiple Vitamins-Minerals (PRESERVISION/LUTEIN) CAPS Take 1 capsule by mouth daily.  0  . nitroGLYCERIN (NITROSTAT) 0.4 MG SL tablet Place 1 tablet (0.4 mg total) under the tongue every 5 (five) minutes as needed. For chest pain  25 tablet  3  . omeprazole (PRILOSEC) 20 MG capsule Take 20 mg by mouth daily.      [name] Glycol-Propyl Glycol (SYSTANE OP) Place 1 drop into both eyes as directed. 2-4 times a day      . warfarin (COUMADIN) 4 MG tablet Take 4 mg by mouth every evening.        No current facility-administered medications on file prior to visit.    Allergies  Allergen Reactions  . Metformin And Related Nausea And Vomiting    Past Medical History  Diagnosis Date  . Coronary artery disease     s/p PCI to LAD 08/05/10  . Hypertension   . Diabetes mellitus   . Hyperlipidemia   . Barrett esophagus   . Diabetes mellitus   . Gallstone pancreatitis 2011  . Nephrolithiasis   . SBO (small bowel obstruction) 2005  . Colon cancer   . Macrocytic anemia 06/25/2011  . Atrial flutter with rapid ventricular  response 07/18/11    failed TEE due to inability to pass probe into the esophagus in March 2013; now managed with anticoagulation and rate control  . Chronic anticoagulation   . Colon cancer   . Colon cancer metastasized to mesenteric lymph nodes 06/16/2012    Past Surgical History  Procedure Laterality Date  . Cholecystectomy  2011  . Right colectomy    . Coronary angioplasty with stent placement  08/05/10    DES to the LAD  . Cardiac catheterization  08/03/10  . Cardiac catheterization  08/09/10    LAD 30, stent OK, CFX 40, RCA < 20  . Abdominal mass resection      Mass near mesentery  . Catracts      s/p bilateral removal     History  Smoking status  . Former Smoker  . Quit date: 05/10/1994  Smokeless tobacco  . Not on file    History  Alcohol Use No    Family History  Problem Relation Age of Onset  . Cancer Mother   . Ulcers Father     Reviw of Systems:  Reviewed in the HPI.  All other systems are negative.  Physical Exam: Blood pressure 130/60, pulse 63, height 5\' 9"  (1.753 m), weight 168 lb (76.204 kg). The patient is alert and oriented x 3. The mood and affect are normal.  Skin: warm and dry. Color is normal.  HEENT: he has 2+carotids. He is no JVD.  Lungs: His lungs are clear to auscultation.  Heart: irregularly irregular S1-S2.  Abdomen: His abdominal exam is benign.  Extremities:  no clubbing cyanosis or edema.  Neuro: Nonfocal  Psych:  Responds to questions appropriately with a normal affect.  ECG:  Assessment / Plan:

## 2013-02-07 NOTE — Patient Instructions (Addendum)
Your physician wants you to follow-up in: 6 months  You will receive a reminder letter in the mail two months in advance. If you don't receive a letter, please call our office to schedule the follow-up appointment.  Your physician recommends that you return for a FASTING lipid profile: 6 months   

## 2013-02-07 NOTE — Assessment & Plan Note (Signed)
He stable. He is asymptomatic from an atrial fib standpoint. We'll continue with medical therapy-rate control and anticoagulation.

## 2013-02-07 NOTE — Assessment & Plan Note (Signed)
[name] not having episodes of chest discomfort. Continue with current medications.

## 2013-02-07 NOTE — Assessment & Plan Note (Signed)
His BP is fairly well controlled.   

## 2013-04-17 ENCOUNTER — Encounter (HOSPITAL_COMMUNITY): Payer: Self-pay | Admitting: Emergency Medicine

## 2013-04-17 ENCOUNTER — Emergency Department (HOSPITAL_COMMUNITY)
Admission: EM | Admit: 2013-04-17 | Discharge: 2013-04-18 | Disposition: A | Discharge status: Home / Self Care | Payer: Medicare Other | Attending: Emergency Medicine | Admitting: Emergency Medicine

## 2013-04-17 DIAGNOSIS — Z79899 Other long term (current) drug therapy: Secondary | ICD-10-CM | POA: Insufficient documentation

## 2013-04-17 DIAGNOSIS — E785 Hyperlipidemia, unspecified: Secondary | ICD-10-CM | POA: Insufficient documentation

## 2013-04-17 DIAGNOSIS — I2510 Atherosclerotic heart disease of native coronary artery without angina pectoris: Secondary | ICD-10-CM | POA: Insufficient documentation

## 2013-04-17 DIAGNOSIS — Z87898 Personal history of other specified conditions: Secondary | ICD-10-CM | POA: Insufficient documentation

## 2013-04-17 DIAGNOSIS — N200 Calculus of kidney: Secondary | ICD-10-CM | POA: Insufficient documentation

## 2013-04-17 DIAGNOSIS — E119 Type 2 diabetes mellitus without complications: Secondary | ICD-10-CM | POA: Insufficient documentation

## 2013-04-17 DIAGNOSIS — Z9861 Coronary angioplasty status: Secondary | ICD-10-CM | POA: Insufficient documentation

## 2013-04-17 DIAGNOSIS — Z8719 Personal history of other diseases of the digestive system: Secondary | ICD-10-CM | POA: Insufficient documentation

## 2013-04-17 DIAGNOSIS — Z7982 Long term (current) use of aspirin: Secondary | ICD-10-CM | POA: Insufficient documentation

## 2013-04-17 DIAGNOSIS — I10 Essential (primary) hypertension: Secondary | ICD-10-CM | POA: Insufficient documentation

## 2013-04-17 DIAGNOSIS — Z862 Personal history of diseases of the blood and blood-forming organs and certain disorders involving the immune mechanism: Secondary | ICD-10-CM | POA: Insufficient documentation

## 2013-04-17 DIAGNOSIS — Z7901 Long term (current) use of anticoagulants: Secondary | ICD-10-CM | POA: Insufficient documentation

## 2013-04-17 DIAGNOSIS — Z85038 Personal history of other malignant neoplasm of large intestine: Secondary | ICD-10-CM | POA: Insufficient documentation

## 2013-04-17 DIAGNOSIS — Z87891 Personal history of nicotine dependence: Secondary | ICD-10-CM | POA: Insufficient documentation

## 2013-04-17 DIAGNOSIS — I4892 Unspecified atrial flutter: Secondary | ICD-10-CM | POA: Insufficient documentation

## 2013-04-17 NOTE — ED Notes (Signed)
Bed: WA08 Expected date: 04/17/13 Expected time: 11:12 PM Means of arrival: Ambulance Comments: 79 yo M  Flank pain

## 2013-04-17 NOTE — ED Notes (Signed)
Per EMS, Pt. C/o flank pain radiating to genitals since tonight. Denies n/v, injury. Pt has appointment tomorrow  With doctor for scan due to possible kidney stones. Pain score 9/10. No hematuria. A&Ox4. NAD noted. Pt. Given of fentanyl. Pain decreased to minor discomfort.

## 2013-04-17 NOTE — ED Provider Notes (Signed)
CSN: [id number]     Arrival date & time 04/17/13  2328 History   First MD Initiated Contact with Patient 04/17/13 2343     Chief Complaint  Patient presents with  . Flank Pain   (Consider location/radiation/quality/duration/timing/severity/associated sxs/prior Treatment) HPI HX per PT - L flank pain onset tonight, sharp and severe, radiating to testicles. Lasted until he received narcotics in route with EMS. Now is pain free. No N/V. No F/C, has h/o kidney stones. Is currently being followed by urology for hematuria, has had an office visit and is scheduled for Ct scan in the morning.   Past Medical History  Diagnosis Date  . Coronary artery disease     s/p PCI to LAD 08/05/10  . Hypertension   . Diabetes mellitus   . Hyperlipidemia   . Barrett esophagus   . Diabetes mellitus   . Gallstone pancreatitis 2011  . Nephrolithiasis   . SBO (small bowel obstruction) 2005  . Colon cancer   . Macrocytic anemia 06/25/2011  . Atrial flutter with rapid ventricular response 07/18/11    failed TEE due to inability to pass probe into the esophagus in March 2013; now managed with anticoagulation and rate control  . Chronic anticoagulation   . Colon cancer   . 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 Cholecystectomy  2011  . Right colectomy    . Coronary angioplasty with stent placement  08/05/10    DES to the LAD  . Cardiac catheterization  08/03/10  . Cardiac catheterization  08/09/10    LAD 30, stent OK, CFX 40, RCA < 20  . Abdominal mass resection      Mass near mesentery  . Catracts      s/p bilateral removal    Family History  Problem Relation Age of Onset  . Cancer Mother   . Ulcers Father    History  Substance Use Topics  . Smoking status: Former Smoker    Quit date: 05/10/1994  . Smokeless tobacco: Not on file  . Alcohol Use: No    Review of Systems  Constitutional: Negative for fever and chills.   Eyes: Negative for pain.  Respiratory: Negative for shortness of breath.   Cardiovascular: Negative for chest pain.  Gastrointestinal: Negative for vomiting.  Genitourinary: Positive for hematuria and flank pain.  Musculoskeletal: Negative for back pain, neck pain and neck stiffness.  Skin: Negative for rash.  Neurological: Negative for headaches.  All other systems reviewed and are negative.    Allergies  Metformin and related  Home Medications   Current Outpatient Rx  Name  Route  Sig  Dispense  Refill  . aspirin EC 81 MG tablet   Oral   Take 81 mg by mouth daily.         [name] atorvastatin (LIPITOR) 40 MG tablet   Oral   Take 1 tablet (40 mg total) by mouth daily.   30 tablet   6   . Cholecalciferol (VITAMIN D-3 PO)   Oral   Take 1 tablet by mouth daily.          [name] glimepiride (AMARYL) 4 MG tablet   Oral   Take 4 mg by mouth daily after breakfast.          . isosorbide mononitrate (IMDUR) 60 MG 24 hr tablet   Oral   Take 1.5 tablets (90 mg total) by mouth 2 (two) times daily.  90 tablet   6   . lisinopril (PRINIVIL,ZESTRIL) 2.5 MG tablet   Oral   Take 2.5 mg by mouth daily.          . Melatonin 10 MG CAPS   Oral   Take 5 mg by mouth at bedtime. For Sleep         . Multiple Vitamins-Minerals (PRESERVISION/LUTEIN) CAPS   Oral   Take 1 capsule by mouth daily.       0   . nitroGLYCERIN (NITROSTAT) 0.4 MG SL tablet   Sublingual   Place 1 tablet (0.4 mg total) under the tongue every 5 (five) minutes as needed. For chest pain   25 tablet   3   . omeprazole (PRILOSEC) 20 MG capsule   Oral   Take 20 mg by mouth daily.         [name] Glycol-Propyl Glycol (SYSTANE OP)   Both Eyes   Place 1 drop into both eyes as directed. 2-4 times a day         . warfarin (COUMADIN) 4 MG tablet   Oral   Take 4 mg by mouth every evening.          . linagliptin (TRADJENTA) 5 MG TABS tablet   Oral   Take 5 mg by mouth daily. For blood sugar           BP 197/86  Pulse 66  Temp(Src) 98.2 F (36.8 C) (Oral)  Resp 18  Ht 5\' 9"  (1.753 m)  Wt 165 lb (74.844 kg)  BMI 24.36 kg/m2  SpO2 94% Physical Exam  Constitutional: He is oriented to person, place, and time. He appears well-developed and well-nourished.  HENT:  Head: Normocephalic and atraumatic.  Eyes: EOM are normal. Pupils are equal, round, and reactive to light.  Neck: Neck supple.  Cardiovascular: Normal rate, regular rhythm and intact distal pulses.   Pulmonary/Chest: Effort normal and breath sounds normal. No respiratory distress.  Abdominal: Soft. Bowel sounds are normal. He exhibits no distension and no mass. There is no tenderness. There is no rebound and no guarding.  No CVAT  Musculoskeletal: Normal range of motion. He exhibits no edema.  Neurological: He is alert and oriented to person, place, and time.  Skin: Skin is warm and dry.    ED Course  Procedures (including critical care time) Labs Review Labs Reviewed  URINALYSIS, ROUTINE W REFLEX MICROSCOPIC - Abnormal; Notable for the following:    Glucose, UA 100 (*)    Hgb urine dipstick LARGE (*)    Protein, ur 30 (*)    All other components within normal limits  CBC - Abnormal; Notable for the following:    RBC 3.73 (*)    HCT 37.3 (*)    MCH 34.9 (*)    All other components within normal limits  POCT I-STAT, CHEM 8 - Abnormal; Notable for the following:    Glucose, Bld 208 (*)    All other components within normal limits  URINE MICROSCOPIC-ADD ON   Imaging Review Ct Abdomen Pelvis Wo Contrast  04/18/2013   CLINICAL DATA:  Left flank pain radiating to the groin, gross hematuria. Status post cholecystectomy, appendectomy, right colectomy.  EXAM: CT ABDOMEN AND PELVIS WITHOUT CONTRAST  TECHNIQUE: Multidetector CT imaging of the abdomen and pelvis was performed following the standard protocol without intravenous contrast.  COMPARISON:  CT of the abdomen and pelvis April 20, 2010  FINDINGS: Limited  view of the lung bases demonstrates interstitial prominence which  may reflect fibrosis, the included heart and pericardium are nonsuspicious, mild Coronary artery calcifications.  Moderate left hydroureteronephrosis to the level of the distal ureter where a 4 mm calculus is seen. Left extrarenal pelvis. 12 mm left lower pole, 5 mm left lower pole, 2 mm left lower pole, 3 mm left interpolar, 3 mm left interpolar, 5 mm right interpolar in at least 5 right lower pole 3-4 mm calculi seen. No right hydronephrosis. Limited assessment for solid renal masses on this nonenhanced examination. Nonspecific bilateral perinephric stranding, slightly increased. 27 mm left lower pole exophytic low-density cyst was better characterized on prior contrast enhanced CT. Urinary bladder is well distended, harboring no intravesicular calculi.  Status post cholecystectomy. The liver, spleen, pancreas and adrenal glands are unremarkable.  Moderate to severe calcific atherosclerosis of aorta with a ectatic infrarenal aorta to 2.8 cm, previously 2.7 cm though disc difference of which may be technical.  Surgical clips along the greater curvature of the stomach were present previously. Fat fluid level in the gastric antrum may reflect ingested material. Status post apparent partial small bowel resection, possible cecectomy. Rectosigmoid diverticulosis. Mild amount of retained large bowel stool without pericolonic inflammatory changes or bowel obstruction.  Moderate bilateral fat containing inguinal hernias. Atrophy of the rectus abdominis muscles. Mild degenerative change of the lumbar spine. Mild degenerative change of the left greater than right hips.  IMPRESSION: Moderate left hydroureteronephrosis to the level of distal ureter where a 3 mm calculus is seen. Bilateral nephrolithiasis, measuring up to 12 mm in left lower pole. No right hydronephrosis.  Postoperative changes of the abdomen, better seen on prior contrast-enhanced examination.    Electronically Signed   By: Awilda Metro   On: 04/18/2013 01:26    12:03 AM d/w RAD tech, unable to find CT order for tomorrows study, PT sent for CT A/P to evaluate for kidney stone tonight.   IV fentanyl/ zofran for return of mild pain - improved symptoms  Plan d/c home follow up Urology, Rx pain medication and zofran provided. PT agrees to f/u and strict return precautions  MDM  Dx: kidney stone  CT, labs, UA IV narcotics VS and nurses notes reviewed   Sunnie Nielsen, MD 04/18/13 [phone]

## 2013-04-18 ENCOUNTER — Emergency Department (HOSPITAL_COMMUNITY): Payer: Medicare Other

## 2013-04-18 LAB — URINALYSIS, ROUTINE W REFLEX MICROSCOPIC
Bilirubin Urine: NEGATIVE
Ketones, ur: NEGATIVE mg/dL
Nitrite: NEGATIVE
Protein, ur: 30 mg/dL — AB
Urobilinogen, UA: 0.2 mg/dL (ref 0.0–1.0)

## 2013-04-18 LAB — CBC
Hemoglobin: 13 g/dL (ref 13.0–17.0)
MCH: 34.9 pg — ABNORMAL HIGH (ref 26.0–34.0)
MCHC: 34.9 g/dL (ref 30.0–36.0)
RDW: 13 % (ref 11.5–15.5)

## 2013-04-18 LAB — POCT I-STAT, CHEM 8
Creatinine, Ser: 1 mg/dL (ref 0.50–1.35)
HCT: 40 % (ref 39.0–52.0)
Hemoglobin: 13.6 g/dL (ref 13.0–17.0)
Sodium: 143 mEq/L (ref 135–145)
TCO2: 22 mmol/L (ref 0–100)

## 2013-04-18 LAB — PROTIME-INR: Prothrombin Time: 22 seconds — ABNORMAL HIGH (ref 11.6–15.2)

## 2013-04-18 MED ORDER — ONDANSETRON HCL 4 MG PO TABS: 4.0000 mg | ORAL_TABLET | Freq: Four times a day (QID) | ORAL | Status: DC

## 2013-04-18 MED ORDER — ONDANSETRON HCL 4 MG/2ML IJ SOLN
4.0000 mg | Freq: Once | INTRAMUSCULAR | Status: AC
  Administered 2013-04-18: 4 mg via INTRAVENOUS
  Filled 2013-04-18: qty 2

## 2013-04-18 MED ORDER — HYDROCODONE-ACETAMINOPHEN 5-325 MG PO TABS: 1.0000 | ORAL_TABLET | Freq: Four times a day (QID) | ORAL | Status: DC | PRN

## 2013-04-18 MED ORDER — FENTANYL CITRATE 0.05 MG/ML IJ SOLN
50.0000 ug | INTRAMUSCULAR | Status: DC | PRN
  Administered 2013-04-18: 50 ug via INTRAVENOUS
  Filled 2013-04-18: qty 2

## 2013-05-01 ENCOUNTER — Telehealth: Payer: Self-pay | Admitting: Cardiovascular Disease

## 2013-05-01 NOTE — Telephone Encounter (Signed)
Received request from Nurse fax box, documents faxed for surgical clearance. To: [facility] Fax number: [phone] Attention: 05/01/13/KM

## 2013-05-09 ENCOUNTER — Other Ambulatory Visit: Payer: Self-pay | Admitting: Urology

## 2013-05-14 ENCOUNTER — Encounter (HOSPITAL_COMMUNITY)
Admission: RE | Admit: 2013-05-14 | Discharge: 2013-05-14 | Disposition: A | Discharge status: Home / Self Care | Payer: Medicare Other | Source: Ambulatory Visit | Attending: Urology | Admitting: Urology

## 2013-05-14 ENCOUNTER — Encounter (HOSPITAL_COMMUNITY): Payer: Self-pay | Admitting: Pharmacy Technician

## 2013-05-14 ENCOUNTER — Encounter (HOSPITAL_COMMUNITY): Payer: Self-pay

## 2013-05-14 HISTORY — DX: Major depressive disorder, single episode, unspecified: F32.9

## 2013-05-14 HISTORY — DX: Personal history of urinary calculi: Z87.442

## 2013-05-14 HISTORY — DX: Depression, unspecified: F32.A

## 2013-05-14 LAB — BASIC METABOLIC PANEL
BUN: 21 mg/dL (ref 6–23)
CO2: 24 mEq/L (ref 19–32)
Calcium: 9.2 mg/dL (ref 8.4–10.5)
Chloride: 104 mEq/L (ref 96–112)
Creatinine, Ser: 0.98 mg/dL (ref 0.50–1.35)
GFR calc Af Amer: 83 mL/min — ABNORMAL LOW (ref >90)
GFR, EST NON AFRICAN AMERICAN: 72 mL/min — AB (ref >90)
Glucose, Bld: 219 mg/dL — ABNORMAL HIGH (ref 70–99)
POTASSIUM: 4.4 meq/L (ref 3.7–5.3)
SODIUM: 139 meq/L (ref 137–147)

## 2013-05-14 LAB — PROTIME-INR
INR: 1.09 (ref 0.00–1.49)
PROTHROMBIN TIME: 13.9 s (ref 11.6–15.2)

## 2013-05-14 LAB — CBC
HCT: 40.4 % (ref 39.0–52.0)
Hemoglobin: 13.8 g/dL (ref 13.0–17.0)
MCH: 34.4 pg — ABNORMAL HIGH (ref 26.0–34.0)
MCHC: 34.2 g/dL (ref 30.0–36.0)
MCV: 100.7 fL — ABNORMAL HIGH (ref 78.0–100.0)
Platelets: 154 10*3/uL (ref 150–400)
RBC: 4.01 MIL/uL — ABNORMAL LOW (ref 4.22–5.81)
RDW: 13.1 % (ref 11.5–15.5)
WBC: 6.6 10*3/uL (ref 4.0–10.5)

## 2013-05-14 LAB — APTT: aPTT: 30 seconds (ref 24–37)

## 2013-05-14 NOTE — Progress Notes (Signed)
Surgery clearance note Dr. [name] 04/30/13 on chart, LOV note Dr. [name] 02/07/13 on chart, Chest x-ray 12/13/12 on [facility], EKG 12/13/12 on EP[facility]

## 2013-05-14 NOTE — Patient Instructions (Addendum)
Ward  05/14/2013   Your procedure is scheduled on: 05/16/13  Report to [facility] at 6:00 AM.  Call this number if you have problems the morning of surgery [phone]   Remember: please do not take any diabetic medications the day of surgery   Do not eat food or drink liquids After Midnight.     Take these medicines the morning of surgery with A SIP OF WATER: Imdur/ isosorbide mononitrate, omeprazole, lipitor, systane eye drops   Do not wear jewelry, make-up or nail polish.  Do not wear lotions, powders, or perfumes. You may wear deodorant.  Do not shave 48 hours prior to surgery. Men may shave face and neck.  Do not bring valuables to the hospital.  Contacts, dentures or bridgework may not be worn into surgery.    Patients discharged the day of surgery will not be allowed to drive home.  Name and phone number of your driver: sister [phone]  Paulette Blanch, RN  pre op nurse call if needed [phone]    FAILURE TO [place] OF YOUR SURGERY   Patient Signature: ___________________________________________

## 2013-05-16 ENCOUNTER — Encounter (HOSPITAL_COMMUNITY): Payer: Self-pay | Admitting: *Deleted

## 2013-05-16 ENCOUNTER — Encounter (HOSPITAL_COMMUNITY): Payer: Self-pay | Admitting: Anesthesiology

## 2013-05-16 ENCOUNTER — Other Ambulatory Visit: Payer: Self-pay | Admitting: Urology

## 2013-05-16 ENCOUNTER — Ambulatory Visit (HOSPITAL_COMMUNITY)
Admission: RE | Admit: 2013-05-16 | Discharge: 2013-05-16 | Disposition: A | Discharge status: Home / Self Care | Payer: Medicare Other | Source: Ambulatory Visit | Attending: Urology | Admitting: Urology

## 2013-05-16 ENCOUNTER — Encounter (HOSPITAL_COMMUNITY)
Admission: RE | Disposition: A | Discharge status: Home / Self Care | Payer: Self-pay | Source: Ambulatory Visit | Attending: Urology

## 2013-05-16 DIAGNOSIS — N209 Urinary calculus, unspecified: Secondary | ICD-10-CM | POA: Insufficient documentation

## 2013-05-16 DIAGNOSIS — Z01812 Encounter for preprocedural laboratory examination: Secondary | ICD-10-CM | POA: Insufficient documentation

## 2013-05-16 DIAGNOSIS — Z538 Procedure and treatment not carried out for other reasons: Secondary | ICD-10-CM | POA: Insufficient documentation

## 2013-05-16 LAB — GLUCOSE, CAPILLARY: Glucose-Capillary: 212 mg/dL — ABNORMAL HIGH (ref 70–99)

## 2013-05-16 SURGERY — CYSTOURETEROSCOPY, WITH RETROGRADE PYELOGRAM AND STENT INSERTION
Anesthesia: General

## 2013-05-16 MED ORDER — DEXTROSE 5 % IV SOLN
1.0000 g | Freq: Once | INTRAVENOUS | Status: DC
  Filled 2013-05-16: qty 10

## 2013-05-16 NOTE — Anesthesia Preprocedure Evaluation (Deleted)
Anesthesia Evaluation  Patient identified by MRN, date of birth, ID band Patient awake    Reviewed: Allergy & Precautions, H&P , NPO status , Patient's Chart, lab work & pertinent test results  Airway       Dental   Pulmonary former smoker,          Cardiovascular hypertension, Pt. on medications + CAD + dysrhythmias Atrial Fibrillation  12/15/12 Findings:  No reversible ischemia is identified. Mild diaphragmatic attenuation of the inferior wall.  The calculated ejection fraction is 47%.  End-diastolic volume 79 ml.  End-systolic volume is 42 ml.   IMPRESSION: 1.  No reversible ischemia. 2.   Calculated ejection fraction of 47%.   Neuro/Psych negative neurological ROS  negative psych ROS   GI/Hepatic negative GI ROS, Neg liver ROS,   Endo/Other  diabetes, Type 2, Oral Hypoglycemic Agents  Renal/GU negative Renal ROS  negative genitourinary   Musculoskeletal negative musculoskeletal ROS (+)   Abdominal   Peds negative pediatric ROS (+)  Hematology negative hematology ROS (+)   Anesthesia Other Findings   Reproductive/Obstetrics negative OB ROS                           Anesthesia Physical Anesthesia Plan Anesthesia Quick Evaluation

## 2013-05-16 NOTE — Progress Notes (Signed)
PT WAS CANCELED FOR SURGERY 05/16/13 - ATE BREAKFAST.  NOW RESCHEDULED FOR SURGERY ON Friday 05/18/13.  PREOP INSTRUCTIONS AND CHLORHEXIDINE INSTRUCTIONS REVIEWED WITH PT BY PHONE.

## 2013-05-16 NOTE — Progress Notes (Signed)
Dr. Tresa Moore in to see patient. Surgery cancelled today because patient ate Breakfast. The office will call him to reschedule his surgery

## 2013-05-16 NOTE — Progress Notes (Signed)
Jennette Banker RN in [place] notified that patient ate this am. She will talk to Anesth when they arrive

## 2013-05-16 NOTE — Progress Notes (Signed)
Patient discharged via ambultory. He knows the office will call him to reschedule his surgery

## 2013-05-17 MED ORDER — DEXTROSE 5 % IV SOLN
1.0000 g | Freq: Once | INTRAVENOUS | Status: AC
  Administered 2013-05-18: 1 g via INTRAVENOUS
  Filled 2013-05-17: qty 10

## 2013-05-18 ENCOUNTER — Ambulatory Visit (HOSPITAL_COMMUNITY): Payer: Medicare Other | Admitting: Certified Registered Nurse Anesthetist

## 2013-05-18 ENCOUNTER — Encounter (HOSPITAL_COMMUNITY): Payer: Self-pay | Admitting: *Deleted

## 2013-05-18 ENCOUNTER — Ambulatory Visit (HOSPITAL_COMMUNITY)
Admission: RE | Admit: 2013-05-18 | Discharge: 2013-05-18 | Disposition: A | Discharge status: Home / Self Care | Payer: Medicare Other | Source: Ambulatory Visit | Attending: Urology | Admitting: Urology

## 2013-05-18 ENCOUNTER — Encounter (HOSPITAL_COMMUNITY)
Admission: RE | Disposition: A | Discharge status: Home / Self Care | Payer: Self-pay | Source: Ambulatory Visit | Attending: Urology

## 2013-05-18 ENCOUNTER — Encounter (HOSPITAL_COMMUNITY): Payer: Medicare Other | Admitting: Certified Registered Nurse Anesthetist

## 2013-05-18 DIAGNOSIS — N200 Calculus of kidney: Secondary | ICD-10-CM | POA: Insufficient documentation

## 2013-05-18 DIAGNOSIS — I2510 Atherosclerotic heart disease of native coronary artery without angina pectoris: Secondary | ICD-10-CM | POA: Insufficient documentation

## 2013-05-18 DIAGNOSIS — Z87891 Personal history of nicotine dependence: Secondary | ICD-10-CM | POA: Insufficient documentation

## 2013-05-18 DIAGNOSIS — R310 Gross hematuria: Secondary | ICD-10-CM | POA: Insufficient documentation

## 2013-05-18 DIAGNOSIS — Z9089 Acquired absence of other organs: Secondary | ICD-10-CM | POA: Insufficient documentation

## 2013-05-18 DIAGNOSIS — E119 Type 2 diabetes mellitus without complications: Secondary | ICD-10-CM | POA: Insufficient documentation

## 2013-05-18 DIAGNOSIS — E785 Hyperlipidemia, unspecified: Secondary | ICD-10-CM | POA: Insufficient documentation

## 2013-05-18 DIAGNOSIS — C772 Secondary and unspecified malignant neoplasm of intra-abdominal lymph nodes: Secondary | ICD-10-CM | POA: Insufficient documentation

## 2013-05-18 DIAGNOSIS — K219 Gastro-esophageal reflux disease without esophagitis: Secondary | ICD-10-CM | POA: Insufficient documentation

## 2013-05-18 DIAGNOSIS — N201 Calculus of ureter: Secondary | ICD-10-CM | POA: Insufficient documentation

## 2013-05-18 DIAGNOSIS — I10 Essential (primary) hypertension: Secondary | ICD-10-CM | POA: Insufficient documentation

## 2013-05-18 DIAGNOSIS — C189 Malignant neoplasm of colon, unspecified: Secondary | ICD-10-CM | POA: Insufficient documentation

## 2013-05-18 HISTORY — PX: CYSTOSCOPY WITH RETROGRADE PYELOGRAM, URETEROSCOPY AND STENT PLACEMENT: SHX5789

## 2013-05-18 LAB — GLUCOSE, CAPILLARY
GLUCOSE-CAPILLARY: 145 mg/dL — AB (ref 70–99)
Glucose-Capillary: 149 mg/dL — ABNORMAL HIGH (ref 70–99)

## 2013-05-18 SURGERY — CYSTOURETEROSCOPY, WITH RETROGRADE PYELOGRAM AND STENT INSERTION
Anesthesia: General | Site: Ureter | Laterality: Bilateral

## 2013-05-18 MED ORDER — IOHEXOL 300 MG/ML  SOLN
INTRAMUSCULAR | Status: DC | PRN
  Administered 2013-05-18: 20 mL via INTRAVENOUS

## 2013-05-18 MED ORDER — LIDOCAINE HCL (CARDIAC) 20 MG/ML IV SOLN
INTRAVENOUS | Status: DC | PRN
  Administered 2013-05-18: 100 mg via INTRAVENOUS

## 2013-05-18 MED ORDER — PROMETHAZINE HCL 25 MG/ML IJ SOLN: 6.2500 mg | INTRAMUSCULAR | Status: DC | PRN

## 2013-05-18 MED ORDER — TRAMADOL HCL 50 MG PO TABS: 50.0000 mg | ORAL_TABLET | Freq: Four times a day (QID) | ORAL | Status: DC | PRN

## 2013-05-18 MED ORDER — PROPOFOL 10 MG/ML IV BOLUS
INTRAVENOUS | Status: DC | PRN
  Administered 2013-05-18: 20 mg via INTRAVENOUS
  Administered 2013-05-18: 200 mg via INTRAVENOUS

## 2013-05-18 MED ORDER — HYDROMORPHONE HCL PF 1 MG/ML IJ SOLN: 0.2500 mg | INTRAMUSCULAR | Status: DC | PRN

## 2013-05-18 MED ORDER — EPHEDRINE SULFATE 50 MG/ML IJ SOLN
INTRAMUSCULAR | Status: DC | PRN
  Administered 2013-05-18: 10 mg via INTRAVENOUS

## 2013-05-18 MED ORDER — SENNOSIDES-DOCUSATE SODIUM 8.6-50 MG PO TABS: 1.0000 | ORAL_TABLET | Freq: Two times a day (BID) | ORAL | Status: DC

## 2013-05-18 MED ORDER — FENTANYL CITRATE 0.05 MG/ML IJ SOLN: 25.0000 ug | INTRAMUSCULAR | Status: DC | PRN

## 2013-05-18 MED ORDER — FENTANYL CITRATE 0.05 MG/ML IJ SOLN
INTRAMUSCULAR | Status: DC | PRN
  Administered 2013-05-18 (×4): 25 ug via INTRAVENOUS

## 2013-05-18 MED ORDER — 0.9 % SODIUM CHLORIDE (POUR BTL) OPTIME
TOPICAL | Status: DC | PRN
  Administered 2013-05-18: 1000 mL

## 2013-05-18 MED ORDER — SODIUM CHLORIDE 0.9 % IR SOLN
Status: DC | PRN
  Administered 2013-05-18: 5000 mL

## 2013-05-18 MED ORDER — LIDOCAINE HCL (CARDIAC) 20 MG/ML IV SOLN
INTRAVENOUS | Status: AC
  Filled 2013-05-18: qty 5

## 2013-05-18 MED ORDER — FENTANYL CITRATE 0.05 MG/ML IJ SOLN
INTRAMUSCULAR | Status: AC
  Filled 2013-05-18: qty 2

## 2013-05-18 MED ORDER — PROPOFOL 10 MG/ML IV BOLUS
INTRAVENOUS | Status: AC
  Filled 2013-05-18: qty 20

## 2013-05-18 MED ORDER — ONDANSETRON HCL 4 MG/2ML IJ SOLN
INTRAMUSCULAR | Status: DC | PRN
  Administered 2013-05-18: 4 mg via INTRAVENOUS

## 2013-05-18 MED ORDER — CEPHALEXIN 250 MG PO CAPS: 250.0000 mg | ORAL_CAPSULE | Freq: Two times a day (BID) | ORAL | Status: DC

## 2013-05-18 MED ORDER — ONDANSETRON HCL 4 MG/2ML IJ SOLN
INTRAMUSCULAR | Status: AC
  Filled 2013-05-18: qty 2

## 2013-05-18 MED ORDER — LACTATED RINGERS IV SOLN
INTRAVENOUS | Status: DC
  Administered 2013-05-18: 09:00:00 via INTRAVENOUS

## 2013-05-18 SURGICAL SUPPLY — 25 items
BAG URINE DRAINAGE (UROLOGICAL SUPPLIES) ×3 IMPLANT
BASKET LASER NITINOL 1.9FR (BASKET) ×2 IMPLANT
BASKET STNLS GEMINI 4WIRE 3FR (BASKET) IMPLANT
BASKET ZERO TIP NITINOL 2.4FR (BASKET) IMPLANT
BSKT STON RTRVL 120 1.9FR (BASKET) ×2
BSKT STON RTRVL GEM 120X11 3FR (BASKET)
BSKT STON RTRVL ZERO TP 2.4FR (BASKET)
CATH INTERMIT  6FR 70CM (CATHETERS) ×3 IMPLANT
DRAPE CAMERA CLOSED 9X96 (DRAPES) ×3 IMPLANT
ELECT REM PT RETURN 9FT ADLT (ELECTROSURGICAL)
ELECTRODE REM PT RTRN 9FT ADLT (ELECTROSURGICAL) IMPLANT
FIBER LASER FLEXIVA 200 (UROLOGICAL SUPPLIES) IMPLANT
FIBER LASER FLEXIVA 365 (UROLOGICAL SUPPLIES) IMPLANT
GLOVE BIOGEL M STRL SZ7.5 (GLOVE) ×3 IMPLANT
GOWN STRL REUS W/TWL LRG LVL3 (GOWN DISPOSABLE) ×3 IMPLANT
GUIDEWIRE ANG ZIPWIRE 038X150 (WIRE) ×3 IMPLANT
GUIDEWIRE STR DUAL SENSOR (WIRE) ×3 IMPLANT
IV NS IRRIG 3000ML ARTHROMATIC (IV SOLUTION) ×3 IMPLANT
PACK CYSTO (CUSTOM PROCEDURE TRAY) ×3 IMPLANT
SHEATH ACCESS URETERAL 38CM (SHEATH) ×2 IMPLANT
STENT CONTOUR 6FRX26X.038 (STENTS) ×2 IMPLANT
SYR 10ML ECCENTRIC (SYRINGE) ×2 IMPLANT
SYRINGE 10CC LL (SYRINGE) ×2 IMPLANT
SYRINGE IRR TOOMEY STRL 70CC (SYRINGE) IMPLANT
TUBE FEEDING 8FR 16IN STR KANG (MISCELLANEOUS) ×5 IMPLANT

## 2013-05-18 NOTE — Anesthesia Preprocedure Evaluation (Addendum)
Anesthesia Evaluation  Patient identified by MRN, date of birth, ID band Patient awake  General Assessment Comment:.  Coronary artery disease         s/p PCI to LAD 08/05/10   .  Hypertension     .  Hyperlipidemia     .  Barrett esophagus     .  Gallstone pancreatitis  2011   .  SBO (small bowel obstruction)  2005   .  Macrocytic anemia  06/25/2011   .  Atrial flutter with rapid ventricular response  07/18/11       failed TEE due to inability to pass probe into the esophagus in March 2013; now managed with anticoagulation and rate control   .  Chronic anticoagulation     .  History of kidney stones     .  Depression         some   .  Asthma         "not anymore"   .  Diabetes mellitus     .  GERD (gastroesophageal reflux disease)         mild   .  Colon cancer     .  Colon cancer metastasized to mesenteric lymph nodes  2/7/    Reviewed: Allergy & Precautions, H&P , NPO status , Patient's Chart, lab work & pertinent test results  Airway Mallampati: II TM Distance: >3 FB Neck ROM: Full    Dental  (+) Poor Dentition and Dental Advisory Given Very poor dentition. Crooked, yellowed.:   Pulmonary shortness of breath, asthma , former smoker,  breath sounds clear to auscultation  Pulmonary exam normal       Cardiovascular Exercise Tolerance: Good hypertension, Pt. on medications and Pt. on home beta blockers + CAD, + Cardiac Stents and +CHF + dysrhythmias Atrial Fibrillation Rhythm:Regular Rate:Normal  ECG: RBBB  Stress myoview: 12-15-12: EF 47%. No ischemia  Cardiology office visit with Dr. [name] 02-07-13.   Neuro/Psych PSYCHIATRIC DISORDERS Depression Memory problems    GI/Hepatic negative GI ROS, Neg liver ROS, GERD-  Medicated,  Endo/Other  diabetes, Type 2, Oral Hypoglycemic Agents  Renal/GU negative Renal ROS  negative genitourinary   Musculoskeletal negative musculoskeletal ROS (+)   Abdominal   Peds negative  pediatric ROS (+)  Hematology negative hematology ROS (+) Blood dyscrasia, anemia ,   Anesthesia Other Findings   Reproductive/Obstetrics negative OB ROS                         Anesthesia Physical Anesthesia Plan  ASA: III  Anesthesia Plan: General   Post-op Pain Management:    Induction: Intravenous  Airway Management Planned: LMA  Additional Equipment:   Intra-op Plan:   Post-operative Plan: Extubation in OR  Informed Consent: I have reviewed the patients History and Physical, chart, labs and discussed the procedure including the risks, benefits and alternatives for the proposed anesthesia with the patient or authorized representative who has indicated his/her understanding and acceptance.   Dental advisory given  Plan Discussed with: CRNA  Anesthesia Plan Comments:         Anesthesia Quick Evaluation

## 2013-05-18 NOTE — H&P (Signed)
[name] is an 80 y.o. male.    Chief Complaint: Pre-Op Left Ureteroscopic Stone Manipulation  HPI:   1 -Nephrolithiasis -  04/2013 - Left 84mm distal stone with mod hydro and bilateral lower pole 1cm non-obstructing stones by ER CT 12/9. Distal stone very lean several phleboliths Pre 2014: s/p prior ureteroscopy in mid 2000s. Known bilateral intra-renal stones, non-obstructing since 2009.   2 - Gross Hematuria - New gross painless hematuria on/off 04/2013. CT Stone with stable left renal cyst and bilateral stones as per above. Prior 18PY smoker. Cysto / Retrogrades pending  PMH sig for CAD/Stent (follows with [facility]), Colon Ca (surgery, chemo, XRT, now NED follows [facility]), AFib/Coumadin, DM.   Today [name] is seen to proceed with elective ureterosocpic stone manipulaiton for his persistant left distal stone. He was scheduled to have this done two days ago, but he ate full meal prior, therefore reschedueld for today. No interval fevers.   Past Medical History  Diagnosis Date  . Coronary artery disease     s/p PCI to LAD 08/05/10  . Hypertension   . Hyperlipidemia   . Barrett esophagus   . Gallstone pancreatitis 2011  . SBO (small bowel obstruction) 2005  . Macrocytic anemia 06/25/2011  . Atrial flutter with rapid ventricular response 07/18/11    failed TEE due to inability to pass probe into the esophagus in March 2013; now managed with anticoagulation and rate control  . Chronic anticoagulation   . History of kidney stones   . Depression     some  . Asthma     "not anymore"  . Diabetes mellitus   . GERD (gastroesophageal reflux disease)     mild  . Colon cancer   . Colon cancer metastasized to mesenteric lymph nodes 06/16/2012    Past Surgical History  Procedure Laterality Date  . Cholecystectomy  2011  . Right colectomy    . Coronary angioplasty with stent placement  08/05/10    DES to the LAD  . Cardiac catheterization  08/03/10  . Cardiac  catheterization  08/09/10    LAD 30, stent OK, CFX 40, RCA < 20  . Abdominal mass resection      Mass near mesentery  . Cystoscopy      "with removal of kidney stone in office"  . Cataract extraction Bilateral   . Appendectomy      "took out during colon surgery"    Family History  Problem Relation Age of Onset  . Cancer Mother   . Ulcers Father    Social History:  reports that he quit smoking about 19 years ago. His smoking use included Cigarettes. He smoked 0.00 packs per day for 35 years. He has never used smokeless tobacco. He reports that he drinks alcohol. He reports that he does not use illicit drugs.  Allergies:  Allergies  Allergen Reactions  . Metformin And Related Nausea And Vomiting    No prescriptions prior to admission    No results found for this or any previous visit (from the past 48 hour(s)). No results found.  Review of Systems  Constitutional: Negative.  Negative for fever and chills.  HENT: Negative.   Eyes: Negative.   Respiratory: Negative.   Cardiovascular: Negative.   Gastrointestinal: Negative.   Genitourinary: Positive for flank pain.  Musculoskeletal: Negative.   Skin: Negative.   Neurological: Negative.   Endo/Heme/Allergies: Negative.   Psychiatric/Behavioral: Negative.     There were no vitals taken for this visit.  Physical Exam  Constitutional: He appears well-developed and well-nourished.  HENT:  Head: Normocephalic and atraumatic.  Eyes: Pupils are equal, round, and reactive to light.  Neck: Normal range of motion. Neck supple.  Cardiovascular: Normal rate.   Respiratory: Effort normal.  GI: Soft. Bowel sounds are normal.  Very mild left CVAT  Genitourinary: Penis normal.  Musculoskeletal: Normal range of motion.  Neurological: He is alert.  Skin: Skin is warm and dry.  Psychiatric: He has a normal mood and affect. His behavior is normal. Judgment and thought content normal.     Assessment/Plan  1 - Nephrolithiasis -  Left distal ureteral stone causing acute colic. Also with chronic-appearing bilateral lower pole stones that are in a "low risk" location. Given age, would favor adressing left distal stone only with ureteroscopy as he has many phleboliths close to stone and recetn coumading use making SWL more risky.  We rediscussed ureteroscopic stone manipulation with basketing and laser-lithotripsy in detail.  We rediscussed risks including bleeding, infection, damage to kidney / ureter  bladder, rarely loss of kidney. We rediscussed anesthetic risks and rare but serious surgical complications including DVT, PE, MI, and mortality. We specifically readdressed that in 5-10% of cases a staged approach is required with stenting followed by re-attempt ureteroscopy if anatomy unfavorable. The patient voiced understanding and wises to proceed.    2 - Gross Hematuria - Stone likely explanation, but given smoking hx he still needs cysto. Will perform along with retrogrades at upcoming stone surgery today.  [name] 05/18/2013, 6:38 AM

## 2013-05-18 NOTE — Brief Op Note (Signed)
05/18/2013  10:38 AM  PATIENT:  [name]  80 y.o. male  PRE-OPERATIVE DIAGNOSIS:  LEFT DISTAL URETERAL STONE,HEMATURIA  POST-OPERATIVE DIAGNOSIS:  LEFT DISTAL URETERAL STONE,HEMATURIA  PROCEDURE:  Procedure(s): CYSTOSCOPY WITH BILATERAL RETROGRADE PYELOGRAM, LEFT URETEROSCOPY AND LEFT STENT PLACEMENT (Bilateral)  SURGEON:  Surgeon(s) and Role:    * Alexis Frock, MD - Primary  PHYSICIAN ASSISTANT:   ASSISTANTS: none   ANESTHESIA:   general  EBL:     BLOOD ADMINISTERED:none  DRAINS: none   LOCAL MEDICATIONS USED:  NONE  SPECIMEN:  Source of Specimen:  Left Ureteral Stone  DISPOSITION OF SPECIMEN:  [facility] for compositional analysis  COUNTS:  YES  TOURNIQUET:  * No tourniquets in log *  DICTATION: .Other Dictation: Dictation Number [id number]  PLAN OF CARE: Discharge to home after PACU  PATIENT DISPOSITION:  PACU - hemodynamically stable.   Delay start of Pharmacological VTE agent (>24hrs) due to surgical blood loss or risk of bleeding: not applicable

## 2013-05-18 NOTE — Anesthesia Postprocedure Evaluation (Signed)
  Anesthesia Post-op Note  Patient: [name]  Procedure(s) Performed: Procedure(s) (LRB): CYSTOSCOPY WITH BILATERAL RETROGRADE PYELOGRAM, LEFT URETEROSCOPY AND LEFT STENT PLACEMENT (Bilateral)  Patient Location: PACU  Anesthesia Type: General  Level of Consciousness: awake and alert   Airway and Oxygen Therapy: Patient Spontanous Breathing  Post-op Pain: mild  Post-op Assessment: Post-op Vital signs reviewed, Patient's Cardiovascular Status Stable, Respiratory Function Stable, Patent Airway and No signs of Nausea or vomiting  Last Vitals:  Filed Vitals:   05/18/13 1229  BP: 166/72  Pulse: 73  Temp: 36 C  Resp: 16    Post-op Vital Signs: stable   Complications: No apparent anesthesia complications

## 2013-05-18 NOTE — Progress Notes (Signed)
Patient cannot remember if he took his Metoprolol this am (05/18/13) Dr. Delma Post informed. He said do not give the Metoprolol in short stay prior to surgery

## 2013-05-18 NOTE — Transfer of Care (Signed)
Immediate Anesthesia Transfer of Care Note  Patient: [name]  Procedure(s) Performed: Procedure(s) (LRB): CYSTOSCOPY WITH BILATERAL RETROGRADE PYELOGRAM, LEFT URETEROSCOPY AND LEFT STENT PLACEMENT (Bilateral)  Patient Location: PACU  Anesthesia Type: General  Level of Consciousness: sedated, patient cooperative and responds to stimulation  Airway & Oxygen Therapy: Patient Spontanous Breathing and Patient connected to face mask oxgen  Post-op Assessment: Report given to PACU RN and Post -op Vital signs reviewed and stable  Post vital signs: Reviewed and stable  Complications: No apparent anesthesia complications

## 2013-05-18 NOTE — Discharge Instructions (Signed)
1 - You may have urinary urgency (bladder spasms) and bloody urine on / off with stent in place. This is normal.  2 - Call MD or go to ER for fever >102, severe pain / nausea / vomiting not relieved by medications, or acute change in medical status  3- Remove tethered stent on Monday morning at home by pulling on string, then blue plastic tubing, and discarding. Dr. Tresa Moore is in the office Monday if any problems arise.

## 2013-05-19 NOTE — Op Note (Signed)
NAMEJARRID, [name] NO.:  [id number]  MEDICAL RECORD NO.:  [id number]  LOCATION:  [facility]                         FACILITY:  [facility]  PHYSICIAN:  Alexis Frock, MD     DATE OF BIRTH:  [birth year]  DATE OF PROCEDURE:  05/18/2013 DATE OF DISCHARGE:  05/18/2013                              OPERATIVE REPORT   DIAGNOSES:  Left distal ureteral stone and refractory colic.  PROCEDURE: 1. Cystoscopy with left retrograde pyelogram and interpretation. 2. Left ureteroscopy with basketing of stone. 3. Left ureteral stent placement, 6 x 26 with tether.  FINDINGS: 1. Unremarkable right retrograde pyelogram. 2. Left ureteral stone. 3. Left lower pole stone. 4. Unremarkable urinary bladder.  INDICATION:  [name] is a very pleasant 80 year old gentleman with history of prior nephrolithiasis who presents with approximately 1 month of worsening lower urinary tract symptoms and left flank plain and hematuria.  He was found on evaluation to have a left distal ureteral stone and did not pass with medical therapy.  Options were discussed including further medical therapy versus operative ureteroscopy with bilateral retrograde pyelogram.  He wished to proceed with the latter. Informed consent was obtained and placed in the medical record. Notably, the patient also has known lower pole stone burden bilaterally, however, these were associated with no obstruction and had been in stable position for several years and are purposefully not being addressed.  PROCEDURE IN DETAIL:  The patient being [name] verified, procedure being left ureteroscopy with basketing of stone was confirmed. Procedure was carried out.  General LMA anesthesia was introduced.  The patient placed into a low lithotomy position.  Sterile field created. Cystoscopy performed using a 22-French rigid cystoscope with 12-degree offset lens.  Anterior and posterior urethra were unremarkable. Inspection of  the urinary bladder was unremarkable.  The right ureteral orifice was cannulated with a 6-French end-hole catheter and right retrograde pyelogram was obtained.  Right retrograde pyelogram demonstrated a single right ureter with single system right kidney.  No filling defects or narrowing noted.  Next, left retrograde pyelogram was obtained using similar technique. Left retrograde pyelogram demonstrated single left ureter, single system left kidney.  There was a single filling defect that was mobile in the left ureter consistent with known distal stone.  A 0.038 Glidewire was advanced at the level of the upper pole alongside which a semi-rigid ureteroscopy was performed at the distal orifice to the left ureter alongside a separate Sensor working wire.  This revealed no mucosal abnormalities or stone.  It was felt that the small stones likely had moved retrograde towards the kidney.  As such, a 38-cm ureteral access sheath was carefully placed over the working wire using fluoroscopic guidance.  Next, flexible digital ureteroscopy was performed using an 8- [place] digital ureteroscope.  Inspection systematically of the kidney revealed a small mobile calcification consistent with prior distal ureteral stone.  This appeared to be amenable to simple basketing.  As such, it was basketed with escape basket and brought out in its entirety and set aside for compositional analysis.  Further inspection also corroborated a lower pole stone that appeared to be adherent to papilla and was stable and nonobstructive.  Again we  elected not to address the stone.  Risk of treatment was created in the risk of bleeding as in situ.  The ureteroscope was then removed as was the sheath and no mucosal abnormalities were found.  Finally a new 6 x 26 stent was placed using cystoscopic guidance to the level of the ureter, good proximal distal curl were noted.  Bladder was emptied per cystoscope.  Procedure was then  terminated.          ______________________________ Alexis Frock, MD     TM/MEDQ  D:  05/18/2013  T:  05/19/2013  Job:  [id number]

## 2013-05-21 ENCOUNTER — Encounter (HOSPITAL_COMMUNITY): Payer: Self-pay | Admitting: Urology

## 2013-06-15 ENCOUNTER — Ambulatory Visit (HOSPITAL_BASED_OUTPATIENT_CLINIC_OR_DEPARTMENT_OTHER): Payer: Medicare Other | Admitting: Oncology

## 2013-06-15 ENCOUNTER — Other Ambulatory Visit (HOSPITAL_BASED_OUTPATIENT_CLINIC_OR_DEPARTMENT_OTHER): Payer: Medicare Other

## 2013-06-15 VITALS — BP 158/83 | HR 53 | Temp 96.7°F | Resp 18 | Ht 69.0 in | Wt 162.2 lb

## 2013-06-15 DIAGNOSIS — D539 Nutritional anemia, unspecified: Secondary | ICD-10-CM

## 2013-06-15 DIAGNOSIS — C189 Malignant neoplasm of colon, unspecified: Secondary | ICD-10-CM

## 2013-06-15 DIAGNOSIS — I4891 Unspecified atrial fibrillation: Secondary | ICD-10-CM

## 2013-06-15 DIAGNOSIS — I129 Hypertensive chronic kidney disease with stage 1 through stage 4 chronic kidney disease, or unspecified chronic kidney disease: Secondary | ICD-10-CM

## 2013-06-15 DIAGNOSIS — N189 Chronic kidney disease, unspecified: Secondary | ICD-10-CM

## 2013-06-15 DIAGNOSIS — C772 Secondary and unspecified malignant neoplasm of intra-abdominal lymph nodes: Principal | ICD-10-CM

## 2013-06-15 DIAGNOSIS — Z85038 Personal history of other malignant neoplasm of large intestine: Secondary | ICD-10-CM

## 2013-06-15 DIAGNOSIS — E119 Type 2 diabetes mellitus without complications: Secondary | ICD-10-CM

## 2013-06-15 DIAGNOSIS — I2510 Atherosclerotic heart disease of native coronary artery without angina pectoris: Secondary | ICD-10-CM

## 2013-06-15 LAB — CBC WITH DIFFERENTIAL/PLATELET
BASO%: 0.7 % (ref 0.0–2.0)
Basophils Absolute: 0 10*3/uL (ref 0.0–0.1)
EOS%: 3.6 % (ref 0.0–7.0)
Eosinophils Absolute: 0.2 10*3/uL (ref 0.0–0.5)
HCT: 37.6 % — ABNORMAL LOW (ref 38.4–49.9)
HGB: 12.8 g/dL — ABNORMAL LOW (ref 13.0–17.1)
LYMPH%: 16.7 % (ref 14.0–49.0)
MCH: 35.2 pg — ABNORMAL HIGH (ref 27.2–33.4)
MCHC: 34 g/dL (ref 32.0–36.0)
MCV: 103.7 fL — AB (ref 79.3–98.0)
MONO#: 0.6 10*3/uL (ref 0.1–0.9)
MONO%: 9.2 % (ref 0.0–14.0)
NEUT%: 69.8 % (ref 39.0–75.0)
NEUTROS ABS: 4.2 10*3/uL (ref 1.5–6.5)
Platelets: 141 10*3/uL (ref 140–400)
RBC: 3.63 10*6/uL — AB (ref 4.20–5.82)
RDW: 13.7 % (ref 11.0–14.6)
WBC: 6 10*3/uL (ref 4.0–10.3)
lymph#: 1 10*3/uL (ref 0.9–3.3)

## 2013-06-15 LAB — COMPREHENSIVE METABOLIC PANEL (CC13)
ALK PHOS: 48 U/L (ref 40–150)
ALT: 18 U/L (ref 0–55)
AST: 22 U/L (ref 5–34)
Albumin: 3.7 g/dL (ref 3.5–5.0)
Anion Gap: 6 mEq/L (ref 3–11)
BILIRUBIN TOTAL: 0.37 mg/dL (ref 0.20–1.20)
BUN: 21.2 mg/dL (ref 7.0–26.0)
CO2: 25 mEq/L (ref 22–29)
Calcium: 9.4 mg/dL (ref 8.4–10.4)
Chloride: 109 mEq/L (ref 98–109)
Creatinine: 1.1 mg/dL (ref 0.7–1.3)
Glucose: 146 mg/dl — ABNORMAL HIGH (ref 70–140)
Potassium: 4.3 mEq/L (ref 3.5–5.1)
Sodium: 140 mEq/L (ref 136–145)
TOTAL PROTEIN: 6.8 g/dL (ref 6.4–8.3)

## 2013-06-15 LAB — LACTATE DEHYDROGENASE (CC13): LDH: 196 U/L (ref 125–245)

## 2013-06-15 NOTE — Progress Notes (Signed)
Hematology and Oncology Follow Up Visit  [name] [id number] [birth year] 80 y.o. 06/15/2013 6:47 PM   Principle Diagnosis: Encounter Diagnosis  Name Primary?  . Colon cancer metastasized to mesenteric lymph nodes Yes     Interim History:  Followup visit for this pleasant 80 year old man with history of colon cancer with initial stage III disease diagnosed in October 1992. Status post sigmoid colectomy followed by adjuvant chemotherapy with 5-fluorouracil. He had a retroperitoneal recurrence in July of 1993 treated aggressively with surgery, radiation, and chemotherapy. He achieved a durable remission and is likely cured.    Over the years he has developed a mild macrocytic anemia since 2007 with normal vitamin studies and normal serum immunoglobulins with IFE. He likely has a low grade myelodysplastic syndrome. I never did a bone marrow biopsy since hemoglobins have been stable at 12-13 g.   He continues to do well. He feels good. Appetite is good. Weight is stable. He denies any abdominal pain. No hematochezia or melena. Most recent colonoscopy done about 5 years ago in 2009. I don't think any additional studies are planned given his age.  On the interim medical problem was recurrent nephrolithiasis. He underwent a basket retrieval by Dr. Alexis Blackwell on 05/18/2013.  His cardiac disease has not been active. He is 2 years status post stent to the LAD in July 2012. He is not currently having any chest pain or dyspnea.   Medications: reviewed  Allergies:  Allergies  Allergen Reactions  . Metformin And Related Nausea And Vomiting    Review of Systems: Hematology:  No bleeding ENT ROS: No sore throat Breast ROS:  Respiratory ROS: No cough or dyspnea Cardiovascular ROS:  See above Gastrointestinal ROS:  See above  Genito-Urinary ROS: See above Musculoskeletal ROS: No muscle or bone pain Neurological ROS: No headache or change in vision. Increasing problems with short-term  memory. Dermatological ROS: No rash Remaining ROS negative:   Physical Exam: Blood pressure 158/83, pulse 53, temperature 96.7 F (35.9 C), temperature source Oral, resp. rate 18, height $RemoveBe'5\' 9"'UWVbWOGAp$  (1.753 m), weight 162 lb 3.2 oz (73.573 kg), SpO2 98.00%. Wt Readings from Last 3 Encounters:  06/15/13 162 lb 3.2 oz (73.573 kg)  05/18/13 158 lb 8 oz (71.895 kg)  05/18/13 158 lb 8 oz (71.895 kg)     General appearance: Thin but adequately nourished Caucasian man HENNT: Pharynx no erythema, exudate, mass, or ulcer. No thyromegaly or thyroid nodules Lymph nodes: No cervical, supraclavicular, or axillary lymphadenopathy Breasts:  Lungs: Clear to auscultation, resonant to percussion throughout Heart: Regular rhythm, no murmur, no gallop, no rub, no click, no edema Abdomen: Soft, nontender, normal bowel sounds, no mass, no organomegaly Extremities: No edema, no calf tenderness Musculoskeletal: no joint deformities GU:  Vascular: Carotid pulses 2+, no bruits,  Neurologic: Alert, oriented, PERRLA,   cranial nerves grossly normal, motor strength 5 over 5, reflexes 1+ symmetric, upper body coordination normal, gait normal, Skin: No rash or ecchymosis  Lab Results: CBC W/Diff    Component Value Date/Time   WBC 6.0 06/15/2013 1557   WBC 6.6 05/14/2013 1055   RBC 3.63* 06/15/2013 1557   RBC 4.01* 05/14/2013 1055   HGB 12.8* 06/15/2013 1557   HGB 13.8 05/14/2013 1055   HCT 37.6* 06/15/2013 1557   HCT 40.4 05/14/2013 1055   PLT 141 06/15/2013 1557   PLT 154 05/14/2013 1055   MCV 103.7* 06/15/2013 1557   MCV 100.7* 05/14/2013 1055   MCH 35.2* 06/15/2013 1557   MCH  34.4* 05/14/2013 1055   MCHC 34.0 06/15/2013 1557   MCHC 34.2 05/14/2013 1055   RDW 13.7 06/15/2013 1557   RDW 13.1 05/14/2013 1055   LYMPHSABS 1.0 06/15/2013 1557   LYMPHSABS 0.8 05/30/2012 1524   MONOABS 0.6 06/15/2013 1557   MONOABS 0.7 05/30/2012 1524   EOSABS 0.2 06/15/2013 1557   EOSABS 0.6 05/30/2012 1524   BASOSABS 0.0 06/15/2013 1557   BASOSABS 0.0 05/30/2012  1524     Chemistry      Component Value Date/Time   NA 140 06/15/2013 1557   NA 139 05/14/2013 1050   K 4.3 06/15/2013 1557   K 4.4 05/14/2013 1050   CL 104 05/14/2013 1050   CO2 25 06/15/2013 1557   CO2 24 05/14/2013 1050   BUN 21.2 06/15/2013 1557   BUN 21 05/14/2013 1050   CREATININE 1.1 06/15/2013 1557   CREATININE 0.98 05/14/2013 1050   CREATININE 1.44 07/28/2010 1745   CREATININE 1.54* 06/22/2010 1053      Component Value Date/Time   CALCIUM 9.4 06/15/2013 1557   CALCIUM 9.2 05/14/2013 1050   ALKPHOS 48 06/15/2013 1557   ALKPHOS 35* 09/27/2012 0737   AST 22 06/15/2013 1557   AST 24 09/27/2012 0737   ALT 18 06/15/2013 1557   ALT 21 09/27/2012 0737   BILITOT 0.37 06/15/2013 1557   BILITOT 0.7 09/27/2012 0737      Impression:  #1. Locally recurrent colon cancer treated as outlined above. He remains free of any new disease now out a remarkable 22 years from progression. I told him that he could graduate from our practice at this time. He has become a good friend.   #2. Chronic stable macrocytic anemia hemoglobin 12 g.-13  #3. Coronary artery disease status post single-vessel coronary stent now on aspirin and Coumadin  #4. Type 2 diabetes on oral agent.  #5. Mild chronic renal insufficiency current creatinine 1.1. Improved compared with values obtained her last year of 1.5.  #6. Essential hypertension.  #7. Hyperlipidemia.  #8. Atrial fibrillation on Coumadin   CC: Patient Care Team: Irven Shelling, MD as PCP - General (Internal Medicine)   Annia Belt, MD 2/6/20156:47 PM

## 2013-08-07 ENCOUNTER — Other Ambulatory Visit (INDEPENDENT_AMBULATORY_CARE_PROVIDER_SITE_OTHER): Payer: Medicare Other

## 2013-08-07 ENCOUNTER — Other Ambulatory Visit: Payer: Self-pay | Admitting: *Deleted

## 2013-08-07 DIAGNOSIS — I2510 Atherosclerotic heart disease of native coronary artery without angina pectoris: Secondary | ICD-10-CM

## 2013-08-07 DIAGNOSIS — Z79899 Other long term (current) drug therapy: Secondary | ICD-10-CM

## 2013-08-07 LAB — BASIC METABOLIC PANEL
BUN: 19 mg/dL (ref 6–23)
CALCIUM: 9.2 mg/dL (ref 8.4–10.5)
CO2: 26 mEq/L (ref 19–32)
Chloride: 104 mEq/L (ref 96–112)
Creatinine, Ser: 1 mg/dL (ref 0.4–1.5)
GFR: 71.62 mL/min (ref >60.00)
GLUCOSE: 150 mg/dL — AB (ref 70–99)
Potassium: 5.1 mEq/L (ref 3.5–5.1)
Sodium: 137 mEq/L (ref 135–145)

## 2013-08-07 LAB — HEPATIC FUNCTION PANEL
ALT: 22 U/L (ref 0–53)
AST: 24 U/L (ref 0–37)
Albumin: 3.8 g/dL (ref 3.5–5.2)
Alkaline Phosphatase: 39 U/L (ref 39–117)
BILIRUBIN TOTAL: 0.8 mg/dL (ref 0.3–1.2)
Bilirubin, Direct: 0.1 mg/dL (ref 0.0–0.3)
Total Protein: 6.7 g/dL (ref 6.0–8.3)

## 2013-08-07 LAB — LIPID PANEL
CHOL/HDL RATIO: 3
Cholesterol: 119 mg/dL (ref 0–200)
HDL: 39.5 mg/dL (ref >39.00)
LDL Cholesterol: 70 mg/dL (ref 0–99)
Triglycerides: 50 mg/dL (ref 0.0–149.0)
VLDL: 10 mg/dL (ref 0.0–40.0)

## 2013-08-10 ENCOUNTER — Ambulatory Visit (INDEPENDENT_AMBULATORY_CARE_PROVIDER_SITE_OTHER): Payer: Medicare Other | Admitting: Cardiovascular Disease

## 2013-08-10 ENCOUNTER — Encounter: Payer: Self-pay | Admitting: Cardiovascular Disease

## 2013-08-10 VITALS — BP 142/70 | HR 70 | Ht 69.0 in | Wt 165.0 lb

## 2013-08-10 DIAGNOSIS — R079 Chest pain, unspecified: Secondary | ICD-10-CM

## 2013-08-10 DIAGNOSIS — I2510 Atherosclerotic heart disease of native coronary artery without angina pectoris: Secondary | ICD-10-CM

## 2013-08-10 DIAGNOSIS — I4891 Unspecified atrial fibrillation: Secondary | ICD-10-CM

## 2013-08-10 DIAGNOSIS — I429 Cardiomyopathy, unspecified: Secondary | ICD-10-CM

## 2013-08-10 DIAGNOSIS — E785 Hyperlipidemia, unspecified: Secondary | ICD-10-CM

## 2013-08-10 DIAGNOSIS — I428 Other cardiomyopathies: Secondary | ICD-10-CM

## 2013-08-10 DIAGNOSIS — I10 Essential (primary) hypertension: Secondary | ICD-10-CM

## 2013-08-10 NOTE — Assessment & Plan Note (Signed)
[name] presents with some episodes of chest discomfort that are very similar to his previous episodes of angina. He typically takes a nitroglycerin along with a crushtype aspirin and the pain usually resolves. He takes aspirin 81 mg a day and I really don't think that he needs to take additional aspirin when he has these chest pain. I do think that we need to schedule him Myoview study for further evaluation. I will see him  again in 3 months for followup visit. If the Myoview study is   abnormal then we'll need to proceed with cardiac catheterization

## 2013-08-10 NOTE — Patient Instructions (Signed)
Your physician has requested that you have a lexiscan myoview. . Please follow instruction sheet, as given.  Your physician recommends that you schedule a follow-up appointment in: 3 MONTHS

## 2013-08-10 NOTE — Progress Notes (Signed)
[name] Date of Birth  06-20-1923       [facility]    [employer] [address], [address], [place], [place]  [address]   [place], [place]  [address] [phone]     [phone]   Fax  [phone]    Fax [phone]  Problem List: 1. CAD - s/p stenting July 2012.  2. Hypertension  3. Hyperlipidemia  4. Diabetes Mellitus    History of Present Illness:  [name] is seen today for a 2 week check. He has known CAD with prior PCI to the LAD last year. Other issues include chronic PVC's and PACs, HTN, DM, HLD and systolic heart failure. He now has atrial fib and will be managed with rate control and anticoagulation. He had a failed attempt at [place] in March  due to inability to pass the probe into the esophagus. His EF is 35 to 40% with mild to moderate AS.  His INR has been therapeutic. He really is not interested in doing cardioversion.  December 23, 2011 - he was initially seen in the emergency room. He has progressive back pain associated with increasing shortness of breath. [name] noticed that if he takes a nitroglycerin at this back pain and his shortness of breath improved. He has these discomforts with minor levels of exertion. He is also having at rest.  March 20, 2012: [name] presented with some episodes of chest pain when I last saw him in August. He had a stress Myoview study that was normal.  He still walks almost every day - he goes around [employer] 1-1 1/2 miles a day.  He had a brief episode of CP yesterday - relieved with NTG.  He had some food poisoning recently ( raw oysters)  Oct 03, 2012:  He has been having some mid abdominal pain with radiation around to the back.  He took a NTG with relief.   The pain was a dull pain and would last 30 minutes or so.    He has been putting in his garden without any without any problems.    Oct. 1, 2014:  [name] is doing ok.  He is having problems with his memory.   Has trouble getting the words out sometimes.  No CVA symptoms.   No focal neuro findings.   No  CP or dyspnea.  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    August 10, 2013:   [name] has been having some chest tightness .  Occurs 1-2 times a week.  The chest tightness is relieved if he takes a nitroglycerin.   he also crushes up a full strength aspirin and takes that.   The pains occur with rest.  Not associated with eating  Or drinking anything.   Similar to his previous epidoses of CP but are not as severe.   Current Outpatient Prescriptions on File Prior to Visit  Medication Sig Dispense Refill  . aspirin EC 81 MG tablet Take 81 mg by mouth daily.      [name] atorvastatin (LIPITOR) 40 MG tablet Take 40 mg by mouth every morning.       . Cholecalciferol (VITAMIN D-3 PO) Take 1 tablet by mouth daily.       [name] glimepiride (AMARYL) 4 MG tablet Take 4 mg by mouth daily after breakfast.       . isosorbide mononitrate (IMDUR) 60 MG 24 hr tablet  Take 1.5 tablets (90 mg total) by mouth 2 (two) times daily.  90 tablet  6  . linagliptin (TRADJENTA) 5 MG TABS tablet Take 5 mg by mouth daily as needed. For blood sugar      . lisinopril (PRINIVIL,ZESTRIL) 5 MG tablet Take 7.5 mg by mouth every morning.      . Melatonin (RA MELATONIN) 10 MG TABS Take 5 mg by mouth at bedtime.      . metoprolol succinate (TOPROL-XL) 100 MG 24 hr tablet Take 1 tablet by mouth 2 (two) times daily.      . Multiple Vitamins-Minerals (PRESERVISION/LUTEIN) CAPS Take 1 capsule by mouth daily.     0  . nitroGLYCERIN (NITROSTAT) 0.4 MG SL tablet Place 1 tablet (0.4 mg total) under the tongue every 5 (five) minutes as needed. For chest pain  25 tablet  3  . omeprazole (PRILOSEC) 20 MG capsule Take 20 mg by mouth daily.      Vladimir Faster Glycol-Propyl Glycol (SYSTANE OP) Place 1 drop into both eyes as directed. 2-4 times a day      . warfarin (COUMADIN) 4 MG tablet Take 4 mg by mouth every evening.         No current facility-administered medications on file prior to visit.    Allergies  Allergen Reactions  . Metformin And Related Nausea And Vomiting    Past Medical History  Diagnosis Date  . Coronary artery disease     s/p PCI to LAD 08/05/10  . Hypertension   . Hyperlipidemia   . Barrett esophagus   . Gallstone pancreatitis 2011  . SBO (small bowel obstruction) 2005  . Macrocytic anemia 06/25/2011  . Atrial flutter with rapid ventricular response 07/18/11    failed TEE due to inability to pass probe into the esophagus in March 2013; now managed with anticoagulation and rate control  . Chronic anticoagulation   . History of kidney stones   . Depression     some  . Asthma     "not anymore"  . Diabetes mellitus   . GERD (gastroesophageal reflux disease)     mild  . Colon cancer   . Colon cancer metastasized to mesenteric lymph nodes 06/16/2012    Past Surgical History  Procedure Laterality Date  . Cholecystectomy  2011  . Right colectomy    . Coronary angioplasty with stent placement  08/05/10    DES to the LAD  . Cardiac catheterization  08/03/10  . Cardiac catheterization  08/09/10    LAD 30, stent OK, CFX 40, RCA < 20  . Abdominal mass resection      Mass near mesentery  . Cystoscopy      "with removal of kidney stone in office"  . Cataract extraction Bilateral   . Appendectomy      "took out during colon surgery"  . Cystoscopy with retrograde pyelogram, ureteroscopy and stent placement Bilateral 05/18/2013    Procedure: CYSTOSCOPY WITH BILATERAL RETROGRADE PYELOGRAM, LEFT URETEROSCOPY AND LEFT STENT PLACEMENT;  Surgeon: Alexis Frock, MD;  Location: [facility];  Service: Urology;  Laterality: Bilateral;    History  Smoking status  . Former Smoker -- 49 years  . Types: Cigarettes  . Quit date: 05/10/1994  Smokeless tobacco  . Never Used    Comment: smoked off and on    History  Alcohol Use  . Yes    Comment: scotch twice a week    Family History  Problem  Relation Age of Onset  .  Cancer Mother   . Ulcers Father     Reviw of Systems:  Reviewed in the HPI.  All other systems are negative.  Physical Exam: Blood pressure 142/70, pulse 70, height 5\' 9"  (1.753 m), weight 165 lb (74.844 kg). The patient is alert and oriented x 3. The mood and affect are normal.  Skin: warm and dry. Color is normal.  HEENT: he has 2+carotids. He is no JVD.  Lungs: His lungs are clear to auscultation.  Heart: irregularly irregular S1-S2.  Abdomen: His abdominal exam is benign.  Extremities:  no clubbing cyanosis or edema.  Neuro: Nonfocal  Psych:  Responds to questions appropriately with a normal affect.  ECG: 08/10/2013: Normal sinus rhythm at 70. He has frequent premature ventricular contractions and also has sinus arrhythmia. Assessment / Plan:

## 2013-08-14 ENCOUNTER — Inpatient Hospital Stay (HOSPITAL_COMMUNITY)
Admission: EM | Admit: 2013-08-14 | Discharge: 2013-08-16 | DRG: 281 | Disposition: A | Discharge status: Home / Self Care | Payer: Medicare Other | Attending: Cardiovascular Disease | Admitting: Cardiovascular Disease

## 2013-08-14 ENCOUNTER — Encounter (HOSPITAL_COMMUNITY): Payer: Self-pay | Admitting: Emergency Medicine

## 2013-08-14 ENCOUNTER — Encounter (HOSPITAL_COMMUNITY): Payer: Medicare Other

## 2013-08-14 ENCOUNTER — Other Ambulatory Visit: Payer: Self-pay

## 2013-08-14 ENCOUNTER — Emergency Department (HOSPITAL_COMMUNITY): Payer: Medicare Other

## 2013-08-14 DIAGNOSIS — R079 Chest pain, unspecified: Secondary | ICD-10-CM | POA: Diagnosis present

## 2013-08-14 DIAGNOSIS — IMO0001 Reserved for inherently not codable concepts without codable children: Secondary | ICD-10-CM | POA: Diagnosis present

## 2013-08-14 DIAGNOSIS — Z7901 Long term (current) use of anticoagulants: Secondary | ICD-10-CM

## 2013-08-14 DIAGNOSIS — I359 Nonrheumatic aortic valve disorder, unspecified: Secondary | ICD-10-CM | POA: Diagnosis present

## 2013-08-14 DIAGNOSIS — I214 Non-ST elevation (NSTEMI) myocardial infarction: Principal | ICD-10-CM

## 2013-08-14 DIAGNOSIS — E1165 Type 2 diabetes mellitus with hyperglycemia: Secondary | ICD-10-CM

## 2013-08-14 DIAGNOSIS — I509 Heart failure, unspecified: Secondary | ICD-10-CM | POA: Diagnosis present

## 2013-08-14 DIAGNOSIS — C189 Malignant neoplasm of colon, unspecified: Secondary | ICD-10-CM | POA: Diagnosis present

## 2013-08-14 DIAGNOSIS — K219 Gastro-esophageal reflux disease without esophagitis: Secondary | ICD-10-CM | POA: Diagnosis present

## 2013-08-14 DIAGNOSIS — I5022 Chronic systolic (congestive) heart failure: Secondary | ICD-10-CM | POA: Diagnosis present

## 2013-08-14 DIAGNOSIS — E785 Hyperlipidemia, unspecified: Secondary | ICD-10-CM | POA: Diagnosis present

## 2013-08-14 DIAGNOSIS — C779 Secondary and unspecified malignant neoplasm of lymph node, unspecified: Secondary | ICD-10-CM | POA: Diagnosis present

## 2013-08-14 DIAGNOSIS — I252 Old myocardial infarction: Secondary | ICD-10-CM | POA: Diagnosis present

## 2013-08-14 DIAGNOSIS — Z7982 Long term (current) use of aspirin: Secondary | ICD-10-CM

## 2013-08-14 DIAGNOSIS — Z79899 Other long term (current) drug therapy: Secondary | ICD-10-CM

## 2013-08-14 DIAGNOSIS — I2510 Atherosclerotic heart disease of native coronary artery without angina pectoris: Secondary | ICD-10-CM | POA: Diagnosis present

## 2013-08-14 DIAGNOSIS — I10 Essential (primary) hypertension: Secondary | ICD-10-CM | POA: Diagnosis present

## 2013-08-14 DIAGNOSIS — Z9861 Coronary angioplasty status: Secondary | ICD-10-CM

## 2013-08-14 DIAGNOSIS — Z87891 Personal history of nicotine dependence: Secondary | ICD-10-CM

## 2013-08-14 DIAGNOSIS — I4891 Unspecified atrial fibrillation: Secondary | ICD-10-CM

## 2013-08-14 HISTORY — DX: Paroxysmal atrial fibrillation: I48.0

## 2013-08-14 HISTORY — DX: Atrial premature depolarization: I49.1

## 2013-08-14 HISTORY — DX: Chronic systolic (congestive) heart failure: I50.22

## 2013-08-14 HISTORY — DX: Type 2 diabetes mellitus without complications: E11.9

## 2013-08-14 HISTORY — DX: Ventricular premature depolarization: I49.3

## 2013-08-14 LAB — CBC
HEMATOCRIT: 39.5 % (ref 39.0–52.0)
Hemoglobin: 13.7 g/dL (ref 13.0–17.0)
MCH: 34.8 pg — ABNORMAL HIGH (ref 26.0–34.0)
MCHC: 34.7 g/dL (ref 30.0–36.0)
MCV: 100.3 fL — AB (ref 78.0–100.0)
Platelets: 150 10*3/uL (ref 150–400)
RBC: 3.94 MIL/uL — ABNORMAL LOW (ref 4.22–5.81)
RDW: 12.9 % (ref 11.5–15.5)
WBC: 6 10*3/uL (ref 4.0–10.5)

## 2013-08-14 LAB — BASIC METABOLIC PANEL
BUN: 18 mg/dL (ref 6–23)
CHLORIDE: 103 meq/L (ref 96–112)
CO2: 24 mEq/L (ref 19–32)
CREATININE: 0.96 mg/dL (ref 0.50–1.35)
Calcium: 9.2 mg/dL (ref 8.4–10.5)
GFR calc non Af Amer: 72 mL/min — ABNORMAL LOW (ref >90)
GFR, EST AFRICAN AMERICAN: 83 mL/min — AB (ref >90)
GLUCOSE: 186 mg/dL — AB (ref 70–99)
Potassium: 4 mEq/L (ref 3.7–5.3)
Sodium: 141 mEq/L (ref 137–147)

## 2013-08-14 LAB — TROPONIN I
Troponin I: <0.3 ng/mL (ref <0.30)
Troponin I: <0.3 ng/mL (ref <0.30)

## 2013-08-14 LAB — GLUCOSE, CAPILLARY: Glucose-Capillary: 204 mg/dL — ABNORMAL HIGH (ref 70–99)

## 2013-08-14 LAB — PROTIME-INR
INR: 1.26 (ref 0.00–1.49)
Prothrombin Time: 15.5 seconds — ABNORMAL HIGH (ref 11.6–15.2)

## 2013-08-14 MED ORDER — HEPARIN (PORCINE) IN NACL 100-0.45 UNIT/ML-% IJ SOLN
1000.0000 [IU]/h | INTRAMUSCULAR | Status: DC
  Administered 2013-08-14: 1000 [IU]/h via INTRAVENOUS
  Filled 2013-08-14 (×2): qty 250

## 2013-08-14 MED ORDER — NITROGLYCERIN 0.4 MG SL SUBL: 0.4000 mg | SUBLINGUAL_TABLET | SUBLINGUAL | Status: DC | PRN

## 2013-08-14 MED ORDER — MELATONIN 10 MG PO TABS: 5.0000 mg | ORAL_TABLET | Freq: Every day | ORAL | Status: DC

## 2013-08-14 MED ORDER — ACETAMINOPHEN 325 MG PO TABS: 650.0000 mg | ORAL_TABLET | ORAL | Status: DC | PRN

## 2013-08-14 MED ORDER — LISINOPRIL 5 MG PO TABS
7.5000 mg | ORAL_TABLET | Freq: Every morning | ORAL | Status: DC
  Administered 2013-08-15 – 2013-08-16 (×2): 7.5 mg via ORAL
  Filled 2013-08-14 (×2): qty 1

## 2013-08-14 MED ORDER — CARVEDILOL 25 MG PO TABS
25.0000 mg | ORAL_TABLET | Freq: Two times a day (BID) | ORAL | Status: DC
  Administered 2013-08-14 – 2013-08-16 (×4): 25 mg via ORAL
  Filled 2013-08-14 (×6): qty 1

## 2013-08-14 MED ORDER — ALPRAZOLAM 0.25 MG PO TABS: 0.2500 mg | ORAL_TABLET | Freq: Two times a day (BID) | ORAL | Status: DC | PRN

## 2013-08-14 MED ORDER — ATORVASTATIN CALCIUM 40 MG PO TABS
40.0000 mg | ORAL_TABLET | Freq: Every day | ORAL | Status: DC
  Administered 2013-08-14 – 2013-08-15 (×2): 40 mg via ORAL
  Filled 2013-08-14 (×3): qty 1

## 2013-08-14 MED ORDER — ONDANSETRON HCL 4 MG/2ML IJ SOLN: 4.0000 mg | Freq: Four times a day (QID) | INTRAMUSCULAR | Status: DC | PRN

## 2013-08-14 MED ORDER — HYDRALAZINE HCL 20 MG/ML IJ SOLN: 10.0000 mg | Freq: Four times a day (QID) | INTRAMUSCULAR | Status: DC | PRN

## 2013-08-14 MED ORDER — ASPIRIN EC 81 MG PO TBEC
81.0000 mg | DELAYED_RELEASE_TABLET | Freq: Every day | ORAL | Status: DC
  Filled 2013-08-14: qty 1

## 2013-08-14 MED ORDER — INSULIN ASPART 100 UNIT/ML ~~LOC~~ SOLN
0.0000 [IU] | Freq: Every day | SUBCUTANEOUS | Status: DC
  Administered 2013-08-14: 2 [IU] via SUBCUTANEOUS

## 2013-08-14 MED ORDER — INSULIN ASPART 100 UNIT/ML ~~LOC~~ SOLN: 0.0000 [IU] | Freq: Three times a day (TID) | SUBCUTANEOUS | Status: DC

## 2013-08-14 MED ORDER — ZOLPIDEM TARTRATE 5 MG PO TABS: 5.0000 mg | ORAL_TABLET | Freq: Every evening | ORAL | Status: DC | PRN

## 2013-08-14 MED ORDER — HEPARIN BOLUS VIA INFUSION
4000.0000 [IU] | Freq: Once | INTRAVENOUS | Status: AC
  Administered 2013-08-14: 4000 [IU] via INTRAVENOUS
  Filled 2013-08-14: qty 4000

## 2013-08-14 MED ORDER — ISOSORBIDE MONONITRATE ER 60 MG PO TB24
90.0000 mg | ORAL_TABLET | Freq: Two times a day (BID) | ORAL | Status: DC
  Administered 2013-08-14 – 2013-08-15 (×2): 90 mg via ORAL
  Filled 2013-08-14 (×3): qty 1

## 2013-08-14 MED ORDER — GLIMEPIRIDE 4 MG PO TABS
4.0000 mg | ORAL_TABLET | Freq: Every day | ORAL | Status: DC
  Administered 2013-08-16: 4 mg via ORAL
  Filled 2013-08-14 (×3): qty 1

## 2013-08-14 MED ORDER — PANTOPRAZOLE SODIUM 40 MG PO TBEC
40.0000 mg | DELAYED_RELEASE_TABLET | Freq: Every day | ORAL | Status: DC
  Administered 2013-08-14 – 2013-08-16 (×3): 40 mg via ORAL
  Filled 2013-08-14 (×3): qty 1

## 2013-08-14 MED ORDER — LINAGLIPTIN 5 MG PO TABS: 5.0000 mg | ORAL_TABLET | Freq: Every day | ORAL | Status: DC | PRN

## 2013-08-14 NOTE — H&P (Signed)
Patient ID: [name] MRN: [id number], DOB/AGE: March 18, 1924   Admit date: 08/14/2013   Primary Physician: Irven Shelling, MD Primary Cardiologist: Dr. [name]   Pt. Profile: [name] is a 80 y.o. male with a history of chronic PVC's and PACs, PAF on rate control and anticoagulation, Barretts esophagus, HTN, DM, HLD and systolic heart failure ( EF 35-40%), mod AS and CAD s/p PCI w/ DES to mLAD 08/05/10 who presented to the [facility] ED today complaining of chest pain. This is like his usual chest pain but much more severe. He was recently seen in the office for chronic chest pain and set up for a Myoview and follow up in 3 months. He was waiting to go to his appointment for his Myoview today when he had sudden onset of 9/10 chest pressure associated with shortness of breath and lightheadedness. It was relieved after 3 sublingual nitroglycerin and he is currently pain-free. He denies radiation, nausea or vomiting, abdominal pain or lower extremity edema. No orthopnea or PND. He did not take his medications yesterday. And was holding off on his beta blocker today for his stress test. His blood pressures have been running high.   Problem List  Past Medical History  Diagnosis Date  . Coronary artery disease     s/p PCI to LAD 08/05/10  . Hypertension   . Hyperlipidemia   . Barrett esophagus   . Gallstone pancreatitis 2011  . SBO (small bowel obstruction) 2005  . Macrocytic anemia 06/25/2011  . Atrial flutter with rapid ventricular response 07/18/11    failed TEE due to inability to pass probe into the esophagus in March 2013; now managed with anticoagulation and rate control  . Chronic anticoagulation   . History of kidney stones   . Depression     some  . Asthma     "not anymore"  . Diabetes mellitus   . GERD (gastroesophageal reflux disease)     mild  . Colon cancer   . Colon cancer metastasized to mesenteric lymph nodes 06/16/2012    Past Surgical History  Procedure  Laterality Date  . Cholecystectomy  2011  . Right colectomy    . Coronary angioplasty with stent placement  08/05/10    DES to the LAD  . Cardiac catheterization  08/03/10  . Cardiac catheterization  08/09/10    LAD 30, stent OK, CFX 40, RCA < 20  . Abdominal mass resection      Mass near mesentery  . Cystoscopy      "with removal of kidney stone in office"  . Cataract extraction Bilateral   . Appendectomy      "took out during colon surgery"  . Cystoscopy with retrograde pyelogram, ureteroscopy and stent placement Bilateral 05/18/2013    Procedure: CYSTOSCOPY WITH BILATERAL RETROGRADE PYELOGRAM, LEFT URETEROSCOPY AND LEFT STENT PLACEMENT;  Surgeon: Alexis Frock, MD;  Location: [facility];  Service: Urology;  Laterality: Bilateral;     Allergies  Allergies  Allergen Reactions  . Metformin And Related Nausea And Vomiting    Home Medications  Prior to Admission medications   Medication Sig Start Date End Date Taking? Authorizing Provider  aspirin 325 MG tablet Pt has been taking 365 mg as needed (x) 2-3 weeks. He states he crushes this into a powder then takes it. (08/10/13)   Yes Historical Provider, MD  aspirin EC 81 MG tablet Take 81 mg by mouth daily.   Yes Historical Provider, MD  atorvastatin (LIPITOR) 40 MG  tablet Take 40 mg by mouth every morning.    Yes Historical Provider, MD  Cholecalciferol (VITAMIN D-3 PO) Take 1 tablet by mouth daily.    Yes Historical Provider, MD  glimepiride (AMARYL) 4 MG tablet Take 4 mg by mouth daily after breakfast.    Yes Historical Provider, MD  isosorbide mononitrate (IMDUR) 60 MG 24 hr tablet Take 1.5 tablets (90 mg total) by mouth 2 (two) times daily. 01/17/13 01/17/14 Yes Thayer Headings, MD  linagliptin (TRADJENTA) 5 MG TABS tablet Take 5 mg by mouth daily as needed. For blood sugar   Yes Historical Provider, MD  lisinopril (PRINIVIL,ZESTRIL) 5 MG tablet Take 7.5 mg by mouth every morning.   Yes Historical Provider, MD  Melatonin (RA MELATONIN) 10  MG TABS Take 5 mg by mouth at bedtime.   Yes Historical Provider, MD  metoprolol succinate (TOPROL-XL) 100 MG 24 hr tablet Take 1 tablet by mouth 2 (two) times daily. 05/16/13  Yes Historical Provider, MD  Multiple Vitamins-Minerals (PRESERVISION/LUTEIN) CAPS Take 1 capsule by mouth daily.  08/13/10  Yes Romeo Apple, MD  nitroGLYCERIN (NITROSTAT) 0.4 MG SL tablet Place 1 tablet (0.4 mg total) under the tongue every 5 (five) minutes as needed. For chest pain 12/18/12  Yes Thayer Headings, MD  omeprazole (PRILOSEC) 20 MG capsule Take 20 mg by mouth daily.   Yes Historical Provider, MD  Polyethyl Glycol-Propyl Glycol (SYSTANE OP) Place 1 drop into both eyes as directed. 2-4 times a day   Yes Historical Provider, MD  warfarin (COUMADIN) 4 MG tablet Take 4 mg by mouth every evening.    Yes Historical Provider, MD    Family History  Family History  Problem Relation Age of Onset  . Cancer Mother   . Ulcers Father     Social History  History   Social History  . Marital Status: Single    Spouse Name: N/A    Number of Children: N/A  . Years of Education: N/A   Occupational History  . Not on file.   Social History Main Topics  . Smoking status: Former Smoker -- 35 years    Types: Cigarettes    Quit date: 05/10/1994  . Smokeless tobacco: Never Used     Comment: smoked off and on  . Alcohol Use: Yes     Comment: scotch twice a week  . Drug Use: No  . Sexual Activity: No   Other Topics Concern  . Not on file   Social History Narrative  . No narrative on file    All other systems reviewed and are otherwise negative except as noted above.  Physical Exam  Blood pressure 156/61, pulse 62, temperature 98.5 F (36.9 C), temperature source Oral, resp. rate 15, height 5\' 9"  (1.753 m), weight 165 lb (74.844 kg), SpO2 98.00%.  General: Pleasant, NAD Psych: Normal affect. Neuro: Alert and oriented X 3. Moves all extremities spontaneously. HEENT: Normal  Neck: Supple without bruits or  JVD. Lungs:  Resp regular and unlabored, CTA. Heart: RRR no s3, s4, or 1/6 systolic  Abdomen: Soft, non-tender, non-distended, BS + x 4.  Extremities: No clubbing, cyanosis or edema. DP/PT/Radials 2+ and equal bilaterally.  Labs   Recent Labs  08/14/13 1010  TROPONINI <0.30   Lab Results  Component Value Date   WBC 6.0 08/14/2013   HGB 13.7 08/14/2013   HCT 39.5 08/14/2013   MCV 100.3* 08/14/2013   PLT 150 08/14/2013    Recent Labs Lab 08/14/13 1010  NA 141  K 4.0  CL 103  CO2 24  BUN 18  CREATININE 0.96  CALCIUM 9.2  GLUCOSE 186*   Lab Results  Component Value Date   CHOL 119 08/07/2013   HDL 39.50 08/07/2013   LDLCALC 70 08/07/2013   TRIG 50.0 08/07/2013     Radiology/Studies  Dg Chest Portable 1 View  08/14/2013   CLINICAL DATA:  Chest pain.  Diabetes.  EXAM: PORTABLE CHEST - 1 VIEW  COMPARISON:  Chest x-ray 12/13/2012.  FINDINGS: Mediastinum and hilar structures are normal. Cardiomegaly. Normal pulmonary vascularity. Mild interstitial prominence noted suggesting interstitial fibrosis. No pleural effusion or pneumothorax. No acute bony abnormality.  IMPRESSION: 1. Mild interstitial prominence noted suggesting interstitial fibrosis.  2. Cardiomegaly, normal pulmonary vascularity. Chest is stable from prior study .    CARDIAC CATHETERIZATION (2012) INDICATIONS FOR PROCEDURE: An 80 year old white male, status post  recent stenting of the mid LAD with a drug-eluting stent who presents  for recurrent chest pain and tightness. Troponins were borderline.  PROCEDURES: Coronary angiography only.  ACCESS: Via the right femoral artery using standard Seldinger  technique.  EQUIPMENT: A 5-French 4-cm right and left Judkins catheter, 5-French  arterial sheath.  MEDICATIONS: Local anesthesia with 1% lidocaine. Versed 2 mg IV.  HEMODYNAMIC DATA: Aortic pressure is 157/75 with a mean of 105 mmHg.  ANGIOGRAPHIC DATA: The left main coronary artery was normal.  The left anterior descending  artery had a 30% ostial stenosis. There is  also a 30% stenosis in the distal vessel. The stent in the mid LAD was  widely patent.  The left circumflex branch had a 40% segmental stenosis in the proximal vessel.  The left circumflex coronary artery had mild irregularities, less than  or equal to 20%.  Right coronary artery has mild irregularities throughout less than 20%.  FINAL INTERPRETATION: Nonobstructive coronary artery disease There is  continued excellent patency of the stent to the left anterior  descending.  PLAN: We will continue medical therapy.  ______________________________  Peter M. Martinique, M.D.   CARDIAC CATHETERIZATION  INDICATION: Mr. Schetter is a very delightful 80 year old gentleman who  has had exertional dyspnea. It has gotten progressively worse. He  underwent diagnostic catheterization which demonstrated a moderately  high-grade stenosis in the mid left anterior descending artery of  approximately 80%. Importantly, the patient had also some GI issues and  his symptoms seemed to began not long after he had his laparoscopic  cholecystectomy. He had had gallstone pancreatitis. As such, we got a  GI consult prior to considering coronary intervention. He underwent  endoscopy which demonstrated a Barrett esophagus. He also had an  ultrasound done. Dr. [name] saw him, and he has followed him  longitudinally for a long time. After consideration of the options,  percutaneous intervention was suggested. He was loaded with 600 mg of  oral clopidogrel and brought to the catheterization laboratory for  percutaneous stenting.  PROCEDURE: Percutaneous stenting of the left anterior descending artery  with a drug-eluting stent.  DESCRIPTION OF THE PROCEDURE: The procedure was performed from the  right femoral artery. The previous study had been done radially.  Through an anterior puncture, the femoral artery was easily entered.  Following this, bivalirudin was given  according to protocol and a JL-3.5  guiding catheter was utilized to intubate the left main. Views of the  left coronary artery were obtained. An Intuition wire nonhydrophilic  was placed across the lesion after an appropriate ACT was obtained.  Predilatation was  done with a short 8-mm balloon. The vessel was  stented using a 2.5 x 12 Promus Element drug-eluting stent. A 2.75, 9-  mm length Gregory Sprinter was used to post dilate the lesion up to 13  atmospheres. Following this, there was still some suggestion of mild  stent mal-expansion and the noncompliant balloon was reinserted and  additional dilatation done up to 16 atmospheres with fairly marked  improvement in the appearance of the artery. There did not appear to be  edge tear. Final angiographic views were obtained. Following this,  gloves were exchanged, the right groin was reprepped, Angio-Seal closure  was performed without complication and good hemostasis was achieved.  Bivalirudin was discontinued.  ANGIOGRAPHIC DATA:  1. Femoral angiography revealed some mild calcification of the femoral  artery but a good landing site, good location for Angio-Seal  closure.  2. The left main is free of critical disease.  3. The LAD tapers. It is calcified proximally with about 30-40%  narrowing. There is a focal 80% slightly hypodense lesion in the  mid left anterior descending artery prior to the takeoff of the  diagonal and distal LAD. The diagonal itself is a small-caliber  vessel with about 50% narrowing, the distal LAD also has diffuse  calcified plaque distally. It does not appear to be critical.  Following stenting, the 80% lesion was reduced to approximately 0%  post intervention. There did not appear to be evidence of edge  tear. There was some segmental geographic miss, but this was  predicated on the fact that the vessel was diffusely diseased  throughout. The distal artery remained widely patent with good  TIMI-3 flow. The  diagonal was unchanged following the procedure.  The circumflex was unchanged from the diagnostic procedure.  CONCLUSION: Successful percutaneous stenting of the mid left anterior  descending artery using a Promus Element drug-eluting stent.  DISPOSITION: The patient will remain on dual-antiplatelet therapy for a  minimum of 12 months. The patient is a stable individual. He will be  on Protonix. He will be followed closely by Dr. [name].    ECG  NSR with RBBB and Pair PVCs  ASSESSMENT AND PLAN [name] is a 80 y.o. male with a history of chronic PVC's and PACs, PAF on rate control and anticoagulation, Barretts esophagus, HTN, DM, HLD and systolic heart failure ( EF 35-40%), mod AS and CAD s/p PCI w/ DES to mLAD 08/05/10 who presented to the [facility] ED today complaining of chest pain. This is like his usual chest pain but much more severe.  Chest pain- known hx of CAD s/p PCI w/ DES to mLAD 08/05/10 and chronic chest pain.  --Troponin x1 negative, ECG with no acute ST or TW changes -- Will admit to telemetry for observation -- Cycle Troponin and serial ECGs -- Continue aspirin, statin, beta blocker -- Lexiscan Myoview in the AM if enzymes remain negative.   HTN- blood pressure running high. Up to 938'B systolic BPs. Continue lisinopril, Toprol-XL, Imdur. He did not take his blood pressure meds yesterday and was holding off for today.  Will discontinue Toprol-XL and substitute Coreg 25 mg BID.   PAF- in NSR with freq PACs and PVCs. -- Continue amiodarone    HLD- well controlled. Continue statin    Tyrell Antonio, PA-C 08/14/2013, 12:29 PM  Pager [phone]  Attending Note:   The patient was seen and examined.  Agree with assessment and plan as noted above.  Changes made to the above note as  needed.  1. Chest pain / CAD: Pt has had CP for the past several weeks - we had ordered a myoview.  He had CP this am before being able to get the [facility]..  Will get a myoview .   Given his age of 32, I would rather do a myoview than cath him right away.  If his enzymes turn positive or if he has more CP , we should consider cath.    2. Atrial fib:  Is in NSR currently.  His INR is 1.2  .  Will hold coumadin for now and use heparin -   He is getting a bit demented recently - has trouble remembering what his pain was like.  This makes it challanging for [place] to evaluate.      Thayer Headings, Brooke Bonito., MD, [facility] 08/14/2013, 1:26 PM

## 2013-08-14 NOTE — ED Notes (Signed)
Heparin verified with Jacqlyn Larsen, RN

## 2013-08-14 NOTE — Progress Notes (Signed)
PHARMACIST - PHYSICIAN ORDER COMMUNICATION  CONCERNING: P&T Medication Policy on Herbal Medications  DESCRIPTION:  This patient's order for:  Melatonin  has been noted.  This product(s) is classified as an "herbal" or natural product. Due to a lack of definitive safety studies or FDA approval, nonstandard manufacturing practices, plus the potential risk of unknown drug-drug interactions while on inpatient medications, the Pharmacy and Therapeutics Committee does not permit the use of "herbal" or natural products of this type within [facility].   ACTION TAKEN: The pharmacy department is unable to verify this order at this time and your patient has been informed of this safety policy. Please reevaluate patient's clinical condition at discharge and address if the herbal or natural product(s) should be resumed at that time.  Renelda Mom Jacqlyn Larsen, PharmD Clinical Pharmacist - Resident Pager: [phone] Pharmacy: [phone] 08/14/2013 6:19 PM

## 2013-08-14 NOTE — ED Provider Notes (Signed)
CSN: [id number]     Arrival date & time 08/14/13  0910 History   First MD Initiated Contact with Patient 08/14/13 0912     Chief Complaint  Patient presents with  . Chest Pain     (Consider location/radiation/quality/duration/timing/severity/associated sxs/prior Treatment) HPI 80 year old male with a history of CAD and atrial fibrillation presents with chest tightness that lasts about 20-30 minutes. He states he was sitting down, waiting to go to his stress test he developed severe chest tightness shortness of breath.At that time the pain was about 8/10. Denies nausea or sweating. He took 3 nitroglycerin with moderate relief. EMS gave him another nitroglycerin and his pain is now gone. Took 324 mg ASA prior to arrival. He states he's had this chest tightness but not as severe multiple times. Due to this his cardiologist ordered a stress test this morning. He denies feeling any palpitations. Denies any leg swelling. He has a mild headache from the nitroglycerin but states that otherwise he feels normal. Denies wanting any medication for his headache.  Past Medical History  Diagnosis Date  . Coronary artery disease     s/p PCI to LAD 08/05/10  . Hypertension   . Hyperlipidemia   . Barrett esophagus   . Gallstone pancreatitis 2011  . SBO (small bowel obstruction) 2005  . Macrocytic anemia 06/25/2011  . Atrial flutter with rapid ventricular response 07/18/11    failed TEE due to inability to pass probe into the esophagus in March 2013; now managed with anticoagulation and rate control  . Chronic anticoagulation   . History of kidney stones   . Depression     some  . Asthma     "not anymore"  . Diabetes mellitus   . GERD (gastroesophageal reflux disease)     mild  . Colon cancer   . 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 Cholecystectomy  2011  . Right colectomy    . Coronary angioplasty with stent placement   08/05/10    DES to the LAD  . Cardiac catheterization  08/03/10  . Cardiac catheterization  08/09/10    LAD 30, stent OK, CFX 40, RCA < 20  . Abdominal mass resection      Mass near mesentery  . Cystoscopy      "with removal of kidney stone in office"  . Cataract extraction Bilateral   . Appendectomy      "took out during colon surgery"  . Cystoscopy with retrograde pyelogram, ureteroscopy and stent placement Bilateral 05/18/2013    Procedure: CYSTOSCOPY WITH BILATERAL RETROGRADE PYELOGRAM, LEFT URETEROSCOPY AND LEFT STENT PLACEMENT;  Surgeon: Alexis Frock, MD;  Location: [facility];  Service: Urology;  Laterality: Bilateral;   Family History  Problem Relation Age of Onset  . Cancer Mother   . Ulcers Father    History  Substance Use Topics  . Smoking status: Former Smoker -- 35 years    Types: Cigarettes    Quit date: 05/10/1994  . Smokeless tobacco: Never Used     Comment: smoked off and on  . Alcohol Use: Yes     Comment: scotch twice a week    Review of Systems  Constitutional: Negative for fever and diaphoresis.  Respiratory: Positive for shortness of breath.   Cardiovascular: Positive for chest pain. Negative for leg swelling.  Gastrointestinal: Negative for nausea.  Neurological: Positive for headaches.  All other systems reviewed and are negative.  Allergies  Metformin and related  Home Medications   Current Outpatient Rx  Name  Route  Sig  Dispense  Refill  . aspirin 325 MG tablet      Pt has been taking 365 mg as needed (x) 2-3 weeks. He states he crushes this into a powder then takes it. (08/10/13)         . aspirin EC 81 MG tablet   Oral   Take 81 mg by mouth daily.         [name] atorvastatin (LIPITOR) 40 MG tablet   Oral   Take 40 mg by mouth every morning.          . Cholecalciferol (VITAMIN D-3 PO)   Oral   Take 1 tablet by mouth daily.          [name] glimepiride (AMARYL) 4 MG tablet   Oral   Take 4 mg by mouth daily after breakfast.           . isosorbide mononitrate (IMDUR) 60 MG 24 hr tablet   Oral   Take 1.5 tablets (90 mg total) by mouth 2 (two) times daily.   90 tablet   6   . linagliptin (TRADJENTA) 5 MG TABS tablet   Oral   Take 5 mg by mouth daily as needed. For blood sugar         . lisinopril (PRINIVIL,ZESTRIL) 5 MG tablet   Oral   Take 7.5 mg by mouth every morning.         . Melatonin (RA MELATONIN) 10 MG TABS   Oral   Take 5 mg by mouth at bedtime.         . metoprolol succinate (TOPROL-XL) 100 MG 24 hr tablet   Oral   Take 1 tablet by mouth 2 (two) times daily.         . Multiple Vitamins-Minerals (PRESERVISION/LUTEIN) CAPS   Oral   Take 1 capsule by mouth daily.       0   . nitroGLYCERIN (NITROSTAT) 0.4 MG SL tablet   Sublingual   Place 1 tablet (0.4 mg total) under the tongue every 5 (five) minutes as needed. For chest pain   25 tablet   3   . omeprazole (PRILOSEC) 20 MG capsule   Oral   Take 20 mg by mouth daily.         Vladimir Faster Glycol-Propyl Glycol (SYSTANE OP)   Both Eyes   Place 1 drop into both eyes as directed. 2-4 times a day         . warfarin (COUMADIN) 4 MG tablet   Oral   Take 4 mg by mouth every evening.           BP 169/93  Pulse 93  Temp(Src) 98.5 F (36.9 C) (Oral)  Resp 14  Ht 5\' 9"  (1.753 m)  Wt 165 lb (74.844 kg)  BMI 24.36 kg/m2  SpO2 99% Physical Exam  Nursing note and vitals reviewed. Constitutional: He is oriented to person, place, and time. He appears well-developed and well-nourished. No distress.  HENT:  Head: Normocephalic and atraumatic.  Right Ear: External ear normal.  Left Ear: External ear normal.  Nose: Nose normal.  Eyes: Right eye exhibits no discharge. Left eye exhibits no discharge.  Neck: Neck supple.  Cardiovascular: Normal rate, regular rhythm, normal heart sounds and intact distal pulses.   Pulmonary/Chest: Effort normal and breath sounds normal.  Abdominal: Soft. There is no tenderness.  Musculoskeletal: He  exhibits no edema.  Neurological: He is alert and oriented to person, place, and time.  Skin: Skin is warm and dry.    ED Course  Procedures (including critical care time) Labs Review Labs Reviewed  CBC  BASIC METABOLIC PANEL  TROPONIN I   Imaging Review Dg Chest Portable 1 View  08/14/2013   CLINICAL DATA:  Chest pain.  Diabetes.  EXAM: PORTABLE CHEST - 1 VIEW  COMPARISON:  Chest x-ray 12/13/2012.  FINDINGS: Mediastinum and hilar structures are normal. Cardiomegaly. Normal pulmonary vascularity. Mild interstitial prominence noted suggesting interstitial fibrosis. No pleural effusion or pneumothorax. No acute bony abnormality.  IMPRESSION: 1. Mild interstitial prominence noted suggesting interstitial fibrosis.  2. Cardiomegaly, normal pulmonary vascularity. Chest is stable from prior study .   Electronically Signed   By: [name]  Register   On: 08/14/2013 09:58   MUSE ECG not functioning at time of charting ECG  Date: 08/14/2013  Rate: 88  Rhythm: normal sinus rhythm  QRS Axis: left  Intervals: normal  ST/T Wave abnormalities: nonspecific ST/T changes  Conduction Disutrbances:right bundle branch block  Narrative Interpretation: Unchanged RBBB  Old EKG Reviewed: unchanged    MDM   Final diagnoses:  Chest pain  Atrial fibrillation    Patient's story concerning for worsening angina. He was unable to get his stress this AM. He is currently asymptomatic, and EKG unchanged from baseline. Will admit to cards.    Ephraim Hamburger, MD 08/14/13 [phone]

## 2013-08-14 NOTE — ED Notes (Signed)
Patient transported by [facility] with complaint of chest pain across the upper chest starting at approximately 0810.  Patient states he was sitting at home waiting to go to the doctor for a test at 0915 and began having chest pain that gradually increased in intensity.  Patient has had 4 sublingual nitro  And 324 mg aspirin prior to arrival.  Per EMS patient initially anxious with blood pressure of 170/100.  After nitro pressure down to 148/90 with heart rate of 110.  Patient has history of afib.

## 2013-08-14 NOTE — Progress Notes (Signed)
ANTICOAGULATION CONSULT NOTE - Initial Consult  Pharmacy Consult for heparin Indication: chest pain/ACS  Allergies  Allergen Reactions  . Metformin And Related Nausea And Vomiting    Patient Measurements: Height: 5\' 9"  (175.3 cm) Weight: 165 lb (74.844 kg) IBW/kg (Calculated) : 70.7 Heparin Dosing Weight: 74.8kg  Vital Signs: Temp: 98.5 F (36.9 C) (04/07 0918) Temp src: Oral (04/07 0918) BP: 144/70 mmHg (04/07 1600) Pulse Rate: 64 (04/07 1600)  Labs:  Recent Labs  08/14/13 1010  HGB 13.7  HCT 39.5  PLT 150  LABPROT 15.5*  INR 1.26  CREATININE 0.96  TROPONINI <0.30    Estimated Creatinine Clearance: 53.2 ml/min (by C-G formula based on Cr of 0.96).   Medical History: Past Medical History  Diagnosis Date  . Coronary artery disease     a. s/p PCI w/ DES to mLAD 08/05/10  . Hypertension   . Hyperlipidemia   . Barrett esophagus   . Gallstone pancreatitis 2011  . SBO (small bowel obstruction) 2005  . Macrocytic anemia 06/25/2011  . Atrial flutter with rapid ventricular response 07/18/11    a. failed TEE due to inability to pass probe into the esophagus in March 2013; now managed with anticoagulation and rate control  . Chronic anticoagulation   . Depression   . Diabetes mellitus   . GERD (gastroesophageal reflux disease)   . Colon cancer     Medications:  Infusions:  . heparin    . heparin      Assessment: 62 yom presented to the ED with CP. He is on chronic coumadin for afib but INR is low at 1.26. CBC is WNL and no bleeding. To start IV heparin for now.  Goal of Therapy:  Heparin level 0.3-0.7 units/ml Monitor platelets by anticoagulation protocol: Yes   Plan:  1. Heparin bolus 4000 units IV x 1 2. Heparin gtt 1000 units/hr 3. Check an 8 hour heparin level 4. Daily heparin level and CBC 5. F/u restart coumadin  Deon Duer, Rande Lawman 08/14/2013,4:33 PM

## 2013-08-15 ENCOUNTER — Encounter (HOSPITAL_COMMUNITY)
Admission: EM | Disposition: A | Discharge status: Home / Self Care | Payer: Medicare Other | Source: Home / Self Care | Attending: Cardiovascular Disease

## 2013-08-15 DIAGNOSIS — E119 Type 2 diabetes mellitus without complications: Secondary | ICD-10-CM

## 2013-08-15 DIAGNOSIS — I252 Old myocardial infarction: Secondary | ICD-10-CM | POA: Diagnosis present

## 2013-08-15 DIAGNOSIS — I2510 Atherosclerotic heart disease of native coronary artery without angina pectoris: Secondary | ICD-10-CM

## 2013-08-15 DIAGNOSIS — I214 Non-ST elevation (NSTEMI) myocardial infarction: Principal | ICD-10-CM

## 2013-08-15 HISTORY — PX: LEFT HEART CATHETERIZATION WITH CORONARY ANGIOGRAM: SHX5451

## 2013-08-15 LAB — HEPARIN LEVEL (UNFRACTIONATED): HEPARIN UNFRACTIONATED: 0.51 [IU]/mL (ref 0.30–0.70)

## 2013-08-15 LAB — GLUCOSE, CAPILLARY
GLUCOSE-CAPILLARY: 116 mg/dL — AB (ref 70–99)
Glucose-Capillary: 145 mg/dL — ABNORMAL HIGH (ref 70–99)
Glucose-Capillary: 152 mg/dL — ABNORMAL HIGH (ref 70–99)
Glucose-Capillary: 168 mg/dL — ABNORMAL HIGH (ref 70–99)

## 2013-08-15 LAB — CBC
HEMATOCRIT: 37.5 % — AB (ref 39.0–52.0)
HEMOGLOBIN: 13.1 g/dL (ref 13.0–17.0)
MCH: 35 pg — ABNORMAL HIGH (ref 26.0–34.0)
MCHC: 34.9 g/dL (ref 30.0–36.0)
MCV: 100.3 fL — ABNORMAL HIGH (ref 78.0–100.0)
Platelets: 149 10*3/uL — ABNORMAL LOW (ref 150–400)
RBC: 3.74 MIL/uL — AB (ref 4.22–5.81)
RDW: 13.1 % (ref 11.5–15.5)
WBC: 5.6 10*3/uL (ref 4.0–10.5)

## 2013-08-15 LAB — TROPONIN I
Troponin I: 0.35 ng/mL (ref <0.30)
Troponin I: 0.35 ng/mL (ref <0.30)

## 2013-08-15 LAB — POCT ACTIVATED CLOTTING TIME: ACTIVATED CLOTTING TIME: 99 s

## 2013-08-15 LAB — HEMOGLOBIN A1C
Hgb A1c MFr Bld: 7.4 % — ABNORMAL HIGH (ref <5.7)
Mean Plasma Glucose: 166 mg/dL — ABNORMAL HIGH (ref <117)

## 2013-08-15 SURGERY — LEFT HEART CATHETERIZATION WITH CORONARY ANGIOGRAM
Anesthesia: LOCAL

## 2013-08-15 MED ORDER — LIDOCAINE HCL (PF) 1 % IJ SOLN
INTRAMUSCULAR | Status: AC
  Filled 2013-08-15: qty 30

## 2013-08-15 MED ORDER — ASPIRIN 81 MG PO CHEW: 81.0000 mg | CHEWABLE_TABLET | ORAL | Status: DC

## 2013-08-15 MED ORDER — ISOSORBIDE MONONITRATE ER 60 MG PO TB24
90.0000 mg | ORAL_TABLET | Freq: Two times a day (BID) | ORAL | Status: DC
  Administered 2013-08-15 – 2013-08-16 (×2): 90 mg via ORAL
  Filled 2013-08-15 (×3): qty 1

## 2013-08-15 MED ORDER — FENTANYL CITRATE 0.05 MG/ML IJ SOLN
INTRAMUSCULAR | Status: AC
  Filled 2013-08-15: qty 2

## 2013-08-15 MED ORDER — SODIUM CHLORIDE 0.9 % IV SOLN: 250.0000 mL | INTRAVENOUS | Status: DC | PRN

## 2013-08-15 MED ORDER — MIDAZOLAM HCL 2 MG/2ML IJ SOLN
INTRAMUSCULAR | Status: AC
  Filled 2013-08-15: qty 2

## 2013-08-15 MED ORDER — SODIUM CHLORIDE 0.9 % IJ SOLN: 3.0000 mL | INTRAMUSCULAR | Status: DC | PRN

## 2013-08-15 MED ORDER — SODIUM CHLORIDE 0.9 % IJ SOLN: 3.0000 mL | Freq: Two times a day (BID) | INTRAMUSCULAR | Status: DC

## 2013-08-15 MED ORDER — SODIUM CHLORIDE 0.9 % IV SOLN: 1.0000 mL/kg/h | INTRAVENOUS | Status: DC

## 2013-08-15 MED ORDER — WARFARIN - PHARMACIST DOSING INPATIENT: Freq: Every day | Status: DC

## 2013-08-15 MED ORDER — NITROGLYCERIN 0.2 MG/ML ON CALL CATH LAB
INTRAVENOUS | Status: AC
  Filled 2013-08-15: qty 1

## 2013-08-15 MED ORDER — SODIUM CHLORIDE 0.9 % IV SOLN: 1.0000 mL/kg/h | INTRAVENOUS | Status: AC

## 2013-08-15 MED ORDER — HEPARIN (PORCINE) IN NACL 2-0.9 UNIT/ML-% IJ SOLN
INTRAMUSCULAR | Status: AC
  Filled 2013-08-15: qty 1000

## 2013-08-15 MED ORDER — WARFARIN SODIUM 6 MG PO TABS
6.0000 mg | ORAL_TABLET | Freq: Once | ORAL | Status: AC
  Administered 2013-08-15: 6 mg via ORAL
  Filled 2013-08-15: qty 1

## 2013-08-15 NOTE — Progress Notes (Signed)
[place] for heparin Indication: chest pain/ACS  Allergies  Allergen Reactions  . Metformin And Related Nausea And Vomiting    Patient Measurements: Height: 5\' 9"  (175.3 cm) Weight: 161 lb 8 oz (73.256 kg) IBW/kg (Calculated) : 70.7 Heparin Dosing Weight: 74.8kg  Vital Signs: Temp: 98.6 F (37 C) (04/07 2105) Temp src: Oral (04/07 2105) BP: 169/79 mmHg (04/07 2105) Pulse Rate: 72 (04/07 2105)  Labs:  Recent Labs  08/14/13 1010 08/14/13 2037 08/15/13 0138 08/15/13 0157  HGB 13.7  --   --  13.1  HCT 39.5  --   --  37.5*  PLT 150  --   --  149*  LABPROT 15.5*  --   --   --   INR 1.26  --   --   --   HEPARINUNFRC  --   --  0.51  --   CREATININE 0.96  --   --   --   TROPONINI <0.30 <0.30 0.35*  --     Estimated Creatinine Clearance: 53.2 ml/min (by C-G formula based on Cr of 0.96).  Assessment: 80 yo male with chest pain for heparin  Goal of Therapy:  Heparin level 0.3-0.7 units/ml Monitor platelets by anticoagulation protocol: Yes   Plan:  Continue Heparin at current rate   [employer] Everardo Voris 08/15/2013,4:15 AM

## 2013-08-15 NOTE — Progress Notes (Signed)
    Subjective:  No chest pain overnight. The patient is comfortable this morning. He denies shortness of breath at rest.  Objective:  Vital Signs in the last 24 hours: Temp:  [97.6 F (36.4 C)-98.6 F (37 C)] 97.6 F (36.4 C) (04/08 0522) Pulse Rate:  [29-137] 81 (04/08 0522) Resp:  [11-25] 18 (04/07 2105) BP: (123-187)/(57-97) 123/57 mmHg (04/08 0522) SpO2:  [93 %-100 %] 95 % (04/08 0522) Weight:  [73.256 kg (161 lb 8 oz)] 73.256 kg (161 lb 8 oz) (04/07 1805)  Intake/Output from previous day: 04/07 0701 - 04/08 0700 In: -  Out: 500 [Urine:500]  Physical Exam: Pt is alert and oriented, pleasant elderly male in NAD HEENT: normal Neck: JVP - normal Lungs: CTA bilaterally CV: RRR without murmur or gallop Abd: soft, NT, Positive BS, no hepatomegaly Ext: no C/C/E, distal pulses intact and equal Skin: warm/dry no rash   Lab Results:  Recent Labs  08/14/13 1010 08/15/13 0157  WBC 6.0 5.6  HGB 13.7 13.1  PLT 150 149*    Recent Labs  08/14/13 1010  NA 141  K 4.0  CL 103  CO2 24  GLUCOSE 186*  BUN 18  CREATININE 0.96    Recent Labs  08/15/13 0138 08/15/13 0716  TROPONINI 0.35* 0.35*    Tele: Sinus rhythm, personally reviewed.  Assessment/Plan:  1. Non-ST elevation MI. The patient has resting chest pain. He has long-standing chest discomfort and known CAD with previous PCI. He had a very severe episode of chest pain yesterday that he rated at 9/10 in intensity. He has now ruled in for MI with mild troponin elevation. I have discussed his case with Dr. Nahser who is his primary cardiologist. We agree that definitive evaluation with cardiac catheterization is indicated and would favor this approach rather than a nuclear scan. He has stabilized on IV heparin. I have reviewed the risks, indications, and alternatives to cardiac catheterization and possible PCI. The patient understands and agrees to proceed. Note the patient is on warfarin for paroxysmal atrial  fibrillation so PCI should be placed in the context of his need for chronic anticoagulation with respect to stent choice, antiplatelet therapy etc.   2. Paroxysmal atrial fibrillation. Patient is in sinus rhythm. He will be continued on amiodarone. His INR is subtherapeutic and will resume Coumadin after his heart catheterization/PCI.  3. Type 2 diabetes. Blood glucoses reviewed and they are in an acceptable range. He is on sliding scale insulin.  Dispo: for cath/PCI today.   Eryca Bolte, M.D. 08/15/2013, 10:41 AM     

## 2013-08-15 NOTE — H&P (View-Only) (Signed)
    Subjective:  No chest pain overnight. The patient is comfortable this morning. He denies shortness of breath at rest.  Objective:  Vital Signs in the last 24 hours: Temp:  [97.6 F (36.4 C)-98.6 F (37 C)] 97.6 F (36.4 C) (04/08 0522) Pulse Rate:  [29-137] 81 (04/08 0522) Resp:  [11-25] 18 (04/07 2105) BP: (123-187)/(57-97) 123/57 mmHg (04/08 0522) SpO2:  [93 %-100 %] 95 % (04/08 0522) Weight:  [73.256 kg (161 lb 8 oz)] 73.256 kg (161 lb 8 oz) (04/07 1805)  Intake/Output from previous day: 04/07 0701 - 04/08 0700 In: -  Out: 500 [Urine:500]  Physical Exam: Pt is alert and oriented, pleasant elderly male in NAD HEENT: normal Neck: JVP - normal Lungs: CTA bilaterally CV: RRR without murmur or gallop Abd: soft, NT, Positive BS, no hepatomegaly Ext: no C/C/E, distal pulses intact and equal Skin: warm/dry no rash   Lab Results:  Recent Labs  08/14/13 1010 08/15/13 0157  WBC 6.0 5.6  HGB 13.7 13.1  PLT 150 149*    Recent Labs  08/14/13 1010  NA 141  K 4.0  CL 103  CO2 24  GLUCOSE 186*  BUN 18  CREATININE 0.96    Recent Labs  08/15/13 0138 08/15/13 0716  TROPONINI 0.35* 0.35*    Tele: Sinus rhythm, personally reviewed.  Assessment/Plan:  1. Non-ST elevation MI. The patient has resting chest pain. He has long-standing chest discomfort and known CAD with previous PCI. He had a very severe episode of chest pain yesterday that he rated at 9/10 in intensity. He has now ruled in for MI with mild troponin elevation. I have discussed his case with Dr. [name] who is his primary cardiologist. We agree that definitive evaluation with cardiac catheterization is indicated and would favor this approach rather than a nuclear scan. He has stabilized on IV heparin. I have reviewed the risks, indications, and alternatives to cardiac catheterization and possible PCI. The patient understands and agrees to proceed. Note the patient is on warfarin for paroxysmal atrial  fibrillation so PCI should be placed in the context of his need for chronic anticoagulation with respect to stent choice, antiplatelet therapy etc.   2. Paroxysmal atrial fibrillation. Patient is in sinus rhythm. He will be continued on amiodarone. His INR is subtherapeutic and will resume Coumadin after his heart catheterization/PCI.  3. Type 2 diabetes. Blood glucoses reviewed and they are in an acceptable range. He is on sliding scale insulin.  Dispo: for cath/PCI today.   [name], M.D. 08/15/2013, 10:41 AM

## 2013-08-15 NOTE — Progress Notes (Signed)
UR completed. Patient changed to inpatient- + troponin

## 2013-08-15 NOTE — Progress Notes (Addendum)
ANTICOAGULATION CONSULT NOTE - Initial Consult  Pharmacy Consult for Coumadin Indication: Hx afib/aflutter  Allergies  Allergen Reactions  . Metformin And Related Nausea And Vomiting    Patient Measurements: Height: 5\' 9"  (175.3 cm) Weight: 161 lb 8 oz (73.256 kg) IBW/kg (Calculated) : 70.7 Heparin Dosing Weight:   Vital Signs: Temp: 97.6 F (36.4 C) (04/08 0522) Temp src: Oral (04/08 0522) BP: 123/57 mmHg (04/08 0522) Pulse Rate: 82 (04/08 1138)  Labs:  Recent Labs  08/14/13 1010 08/14/13 2037 08/15/13 0138 08/15/13 0157 08/15/13 0716  HGB 13.7  --   --  13.1  --   HCT 39.5  --   --  37.5*  --   PLT 150  --   --  149*  --   LABPROT 15.5*  --   --   --   --   INR 1.26  --   --   --   --   HEPARINUNFRC  --   --  0.51  --   --   CREATININE 0.96  --   --   --   --   TROPONINI <0.30 <0.30 0.35*  --  0.35*    Estimated Creatinine Clearance: 53.2 ml/min (by C-G formula based on Cr of 0.96).   Medical History: Past Medical History  Diagnosis Date  . Coronary artery disease     a. s/p PCI w/ DES to mLAD 08/05/10  . Hypertension   . Hyperlipidemia   . Barrett esophagus   . Gallstone pancreatitis 2011  . SBO (small bowel obstruction) 2005  . Macrocytic anemia 06/25/2011  . Atrial flutter with rapid ventricular response 07/18/11    a. failed TEE due to inability to pass probe into the esophagus in March 2013; now managed with anticoagulation and rate control  . Chronic anticoagulation   . Depression   . GERD (gastroesophageal reflux disease)   . Colon cancer   . Type II diabetes mellitus     Medications:  Prescriptions prior to admission  Medication Sig Dispense Refill  . aspirin 325 MG tablet Pt has been taking 365 mg as needed (x) 2-3 weeks. He states he crushes this into a powder then takes it. (08/10/13)      . aspirin EC 81 MG tablet Take 81 mg by mouth daily.      [name] atorvastatin (LIPITOR) 40 MG tablet Take 40 mg by mouth every morning.       .  Cholecalciferol (VITAMIN D-3 PO) Take 1 tablet by mouth daily.       [name] glimepiride (AMARYL) 4 MG tablet Take 4 mg by mouth daily after breakfast.       . isosorbide mononitrate (IMDUR) 60 MG 24 hr tablet Take 1.5 tablets (90 mg total) by mouth 2 (two) times daily.  90 tablet  6  . linagliptin (TRADJENTA) 5 MG TABS tablet Take 5 mg by mouth daily as needed. For blood sugar      . lisinopril (PRINIVIL,ZESTRIL) 5 MG tablet Take 7.5 mg by mouth every morning.      . Melatonin (RA MELATONIN) 10 MG TABS Take 5 mg by mouth at bedtime.      . metoprolol succinate (TOPROL-XL) 100 MG 24 hr tablet Take 1 tablet by mouth 2 (two) times daily.      . Multiple Vitamins-Minerals (PRESERVISION/LUTEIN) CAPS Take 1 capsule by mouth daily.     0  . nitroGLYCERIN (NITROSTAT) 0.4 MG SL tablet Place 1 tablet (0.4 mg total) under the  tongue every 5 (five) minutes as needed. For chest pain  25 tablet  3  . omeprazole (PRILOSEC) 20 MG capsule Take 20 mg by mouth daily.      Vladimir Faster Glycol-Propyl Glycol (SYSTANE OP) Place 1 drop into both eyes as directed. 2-4 times a day      . warfarin (COUMADIN) 4 MG tablet Take 4 mg by mouth every evening.         Assessment: 89yom on Coumadin for hx Afib/Aflutter now to resume s/p cath. Admit INR (1.26) is subtherapeutic. Patient reports missing a few doses prior to admission - will give an increased dose tonight and follow-up AM INR. - H/H wnl, Plts low normal - RN initially reported bleeding from cath site and small hematoma - bleeding was controlled and site is now stable per RN - PTA regimen: 4mg  daily  Goal of Therapy:  INR 2-3   Plan:  1. Coumadin 6mg  po x 1 today 2. Daily INR 3. Follow-up need for bridge therapy  [name] [facility] [phone] 08/15/2013,1:49 PM

## 2013-08-15 NOTE — CV Procedure (Signed)
   Cardiac Catheterization Procedure Note  Name: [name] MRN: [id number] DOB: Oct 12, 1923  Procedure: Left Heart Cath, Selective Coronary Angiography, LV angiography  Indication: 80 yo WM with history of DES of the mid LAD in 2012 present with a NSTEMI.    Procedural details: Prior study in 2012 from the right radial approach revealed a radial loop superior to the elbow. We therefore used a femoral approach. The right groin was prepped, draped, and anesthetized with 1% lidocaine. Using modified Seldinger technique, a 5 French sheath was introduced into the right femoral artery. Standard Judkins catheters were used for coronary angiography and left ventriculography. Catheter exchanges were performed over a guidewire. There were no immediate procedural complications. The patient was transferred to the post catheterization recovery area for further monitoring.  Procedural Findings: Hemodynamics:  AO 107/52 mean 75 mm Hg LV 106/6 mm Hg   Coronary angiography: Coronary dominance: right  Left mainstem: Normal.   Left anterior descending (LAD): The stent in the LAD is widely patent. There is 30% disease in the mid and distal vessel. The first diagonal is small and has a 80% stenosis proximally.  Left circumflex (LCx): Mild disease less than 20%.  Right coronary artery (RCA): Very large dominant vessel with mild irregularities. There is focal 60% stenosis in the ostium of the PDA.  Left ventriculography: Left ventricular systolic function is normal, LVEF is estimated at 55-65%, there is no significant mitral regurgitation   Final Conclusions:   1. Single vessel obstructive CAD involving a small diagonal branch. The LAD stent is patent. 2. Normal LV function.   Recommendations: Continue medical therapy. Will resume coumadin per pharmacy.  Ander Slade [facility] 08/15/2013, 12:07 PM

## 2013-08-15 NOTE — Interval H&P Note (Signed)
History and Physical Interval Note:  08/15/2013 11:32 AM  [name]  has presented today for surgery, with the diagnosis of cp  The various methods of treatment have been discussed with the patient and family. After consideration of risks, benefits and other options for treatment, the patient has consented to  Procedure(s): LEFT HEART CATHETERIZATION WITH CORONARY ANGIOGRAM (N/A) as a surgical intervention .  The patient's history has been reviewed, patient examined, no change in status, stable for surgery.  I have reviewed the patient's chart and labs.  Questions were answered to the patient's satisfaction.    Cath Lab Visit (complete for each Cath Lab visit)  Clinical Evaluation Leading to the Procedure:   ACS: yes  Non-ACS:    Anginal Classification: CCS IV  Anti-ischemic medical therapy: Maximal Therapy (2 or more classes of medications)  Non-Invasive Test Results: No non-invasive testing performed  Prior CABG: No previous CABG       Ander Slade [facility] 08/15/2013 11:32 AM

## 2013-08-15 NOTE — Progress Notes (Signed)
CRITICAL VALUE ALERT  Critical value received:  Troponin .35   Date of notification:  08/15/13  Time of notification:  0248  Critical value read back:yes  Nurse who received alert:  Aquilla Solian, RN   MD notified (1st page):  Yousuf  Time of first page:  0250  Responding MD:  Delane Ginger  Time MD responded:  [phone]

## 2013-08-16 ENCOUNTER — Encounter (HOSPITAL_COMMUNITY): Payer: Self-pay | Admitting: Physician Assistant

## 2013-08-16 DIAGNOSIS — I214 Non-ST elevation (NSTEMI) myocardial infarction: Principal | ICD-10-CM

## 2013-08-16 LAB — CBC
HCT: 35.1 % — ABNORMAL LOW (ref 39.0–52.0)
HEMOGLOBIN: 12.3 g/dL — AB (ref 13.0–17.0)
MCH: 34.8 pg — ABNORMAL HIGH (ref 26.0–34.0)
MCHC: 35 g/dL (ref 30.0–36.0)
MCV: 99.4 fL (ref 78.0–100.0)
Platelets: 131 10*3/uL — ABNORMAL LOW (ref 150–400)
RBC: 3.53 MIL/uL — ABNORMAL LOW (ref 4.22–5.81)
RDW: 13.2 % (ref 11.5–15.5)
WBC: 4.2 10*3/uL (ref 4.0–10.5)

## 2013-08-16 LAB — BASIC METABOLIC PANEL
BUN: 22 mg/dL (ref 6–23)
CALCIUM: 8.9 mg/dL (ref 8.4–10.5)
CO2: 23 meq/L (ref 19–32)
CREATININE: 1.01 mg/dL (ref 0.50–1.35)
Chloride: 102 mEq/L (ref 96–112)
GFR calc non Af Amer: 64 mL/min — ABNORMAL LOW (ref >90)
GFR, EST AFRICAN AMERICAN: 74 mL/min — AB (ref >90)
Glucose, Bld: 196 mg/dL — ABNORMAL HIGH (ref 70–99)
Potassium: 3.8 mEq/L (ref 3.7–5.3)
Sodium: 139 mEq/L (ref 137–147)

## 2013-08-16 LAB — PROTIME-INR
INR: 1.24 (ref 0.00–1.49)
Prothrombin Time: 15.3 seconds — ABNORMAL HIGH (ref 11.6–15.2)

## 2013-08-16 LAB — GLUCOSE, CAPILLARY
GLUCOSE-CAPILLARY: 188 mg/dL — AB (ref 70–99)
Glucose-Capillary: 169 mg/dL — ABNORMAL HIGH (ref 70–99)

## 2013-08-16 MED ORDER — WARFARIN SODIUM 4 MG PO TABS: ORAL_TABLET | ORAL | Status: DC

## 2013-08-16 MED ORDER — CARVEDILOL 25 MG PO TABS: 25.0000 mg | ORAL_TABLET | Freq: Two times a day (BID) | ORAL | Status: DC

## 2013-08-16 NOTE — Discharge Summary (Signed)
Discharge Summary   Patient ID: [name] MRN: [id number], DOB/AGE: 1923/06/29 80 y.o. Admit date: 08/14/2013 D/C date:     08/16/2013  Primary Care Provider: Irven Shelling, MD Primary Cardiologist: Nahser  Primary Discharge Diagnoses:  1. NSTEMI/CAD - prior history of DES to mLAD 2012 - troponin 0.35 -> cath this admission 08/15/13 showing widely patent stent, 80% prox D1 (small vessel) with recommendation for medical management, residual 60% ostial PDA, <20% LCx, LVEF 55-65%.  2. Paroxysmal atrial fibrillation, maintaining NSR, on Coumadin - note: failed attempted at TEE/DCCV 07/2011 due to inability to pass probe 3. Diabetes mellitus uncontrolled A1C 7.4  4. H/o PACs/PVCs  5. Aortic stenosis, mild-moderate by echo 2013 6. HTN 7. Hyperlipidemia 8. Chronic systolic CHF - EF previously 35-40%, improved to 55-65% by cath this admission  Secondary Discharge Diagnoses:  1. Barrett's esophagus  2. Metastatic colon cancer (mesenteric lymph nodes per onc notes, "graduated" from their practice 06/2013, remains free of any new disease per those notes)  3. Suspected low-grade myelodysplastic disease per onc notes 4. Gallstone pancreatitis 2011 5. H/o SBO 2005 6. History of kidney stones 7. Depression 8. Asthma, prior 9. GERD, mild   Hospital Course:Mr. Englin is a 80 y.o. male with a history of chronic PVC's and PACs, PAF, Barretts esophagus, colon cancer, HTN, DM, HLD and chronic systolic heart failure, mod AS, CAD s/p DES to mLAD 08/05/10 who presented to [facility] 08/14/2013 with complaints of chest pain. He was recently seen in the office. This was like his usual chest pain but much more severe. He was recently seen in the office for chronic chest pain and set up for a Myoview. He was waiting to go to his appointment for his Myoview on day of admission when he had sudden onset of 9/10 chest pressure associated with shortness of breath and lightheadedness. It was relieved  after 3 sublingual nitroglycerin. He came to the ER for further evaluation. Blood pressure was high but he had not taken his blood pressure medications the day previously - he was also holding beta blocker on day of admit for his stress test. He denied nausea, vomiting, abdominal pain, orthopnea, PND or lower extremity edema. Coreg was substituted for metoprolol on day of admission and he did well on this. He was in NSR with frequent PACs and PVCs which is chronic for him. Initial troponin neg x2 then next 2 sets were mildly elevated at 0.35 ruling him in for NSTEMI. CXR was clear. Coumadin was held and he was placed on IV heparin as initial INR was only 1.26. He did affirm that he was taking Coumadin 4mg  nightly without missing any doses, so its unclear why INR was so low. INR is followed by [facility] primary care. He underwent cardiac cath on 08/15/13 showing widely patent stent, 80% prox D1 (small vessel) with recommendation for medical management, residual 60% ostial PDA, <20% LCx. LVEF was improved at 55-65%. Coumadin was resumed per pharmacy. He was not felt to require bridging. The patient is feeling well today. Dr. Burt Knack has seen and examined the patient today and feels he is stable for discharge.  Per discussion with pharmacy, he should take 1 1/2 tablets of his Coumadin nightly with close INR recheck on Monday. I called Dr. Delene Ruffini office and was referred to his secretary's voicemail. I left a detailed message requesting that this be scheduled and requesting that they call the patient with this information.   Discharge Vitals: Blood pressure 145/74, pulse  54, temperature 97.3 F (36.3 C), temperature source Oral, resp. rate 18, height 5\' 9"  (1.753 m), weight 155 lb 8 oz (70.534 kg), SpO2 94.00%.  Labs: Lab Results  Component Value Date   WBC 4.2 08/16/2013   HGB 12.3* 08/16/2013   HCT 35.1* 08/16/2013   MCV 99.4 08/16/2013   PLT 131* 08/16/2013     Recent Labs Lab 08/16/13 0824  NA 139  K 3.8  CL  102  CO2 23  BUN 22  CREATININE 1.01  CALCIUM 8.9  GLUCOSE 196*    Recent Labs  08/14/13 1010 08/14/13 2037 08/15/13 0138 08/15/13 0716  TROPONINI <0.30 <0.30 0.35* 0.35*   Lab Results  Component Value Date   CHOL 119 08/07/2013   HDL 39.50 08/07/2013   LDLCALC 70 08/07/2013   TRIG 50.0 08/07/2013     Diagnostic Studies/Procedures   Dg Chest Portable 1 View 08/14/2013   CLINICAL DATA:  Chest pain.  Diabetes.  EXAM: PORTABLE CHEST - 1 VIEW  COMPARISON:  Chest x-ray 12/13/2012.  FINDINGS: Mediastinum and hilar structures are normal. Cardiomegaly. Normal pulmonary vascularity. Mild interstitial prominence noted suggesting interstitial fibrosis. No pleural effusion or pneumothorax. No acute bony abnormality.  IMPRESSION: 1. Mild interstitial prominence noted suggesting interstitial fibrosis.  2. Cardiomegaly, normal pulmonary vascularity. Chest is stable from prior study .   Electronically Signed   By: [name]  Register   On: 08/14/2013 09:58   Cath 08/15/13 Cardiac Catheterization Procedure Note  Name: [name]  MRN: [id number]  DOB: Sep 26, 1923  Procedure: Left Heart Cath, Selective Coronary Angiography, LV angiography  Indication: 81 yo WM with history of DES of the mid LAD in 2012 present with a NSTEMI.  Procedural details: Prior study in 2012 from the right radial approach revealed a radial loop superior to the elbow. We therefore used a femoral approach. The right groin was prepped, draped, and anesthetized with 1% lidocaine. Using modified Seldinger technique, a 5 French sheath was introduced into the right femoral artery. Standard Judkins catheters were used for coronary angiography and left ventriculography. Catheter exchanges were performed over a guidewire. There were no immediate procedural complications. The patient was transferred to the post catheterization recovery area for further monitoring.  Procedural Findings:  Hemodynamics:  AO 107/52 mean 75 mm Hg  LV 106/6 mm  Hg  Coronary angiography:  Coronary dominance: right  Left mainstem: Normal.  Left anterior descending (LAD): The stent in the LAD is widely patent. There is 30% disease in the mid and distal vessel. The first diagonal is small and has a 80% stenosis proximally.  Left circumflex (LCx): Mild disease less than 20%.  Right coronary artery (RCA): Very large dominant vessel with mild irregularities. There is focal 60% stenosis in the ostium of the PDA.  Left ventriculography: Left ventricular systolic function is normal, LVEF is estimated at 55-65%, there is no significant mitral regurgitation  Final Conclusions:  1. Single vessel obstructive CAD involving a small diagonal branch. The LAD stent is patent.  2. Normal LV function.  Recommendations: Continue medical therapy. Will resume coumadin per pharmacy.  Ander Slade [facility]  08/15/2013, 12:07 PM    Discharge Medications   Current Discharge Medication List    START taking these medications   Details  carvedilol (COREG) 25 MG tablet Take 1 tablet (25 mg total) by mouth 2 (two) times daily with a meal. Qty: 60 tablet, Refills: 6      CONTINUE these medications which have CHANGED   Details  warfarin (  COUMADIN) 4 MG tablet Take 1 and a half tablets (6 mg total) by mouth every evening. This is a higher dose for now - you need to have your Coumadin level checked on Monday to see if your dose needs adjusting.      CONTINUE these medications which have NOT CHANGED   Details  aspirin EC 81 MG tablet Take 81 mg by mouth daily.    atorvastatin (LIPITOR) 40 MG tablet Take 40 mg by mouth every morning.     Cholecalciferol (VITAMIN D-3 PO) Take 1 tablet by mouth daily.     glimepiride (AMARYL) 4 MG tablet Take 4 mg by mouth daily after breakfast.     isosorbide mononitrate (IMDUR) 60 MG 24 hr tablet Take 1.5 tablets (90 mg total) by mouth 2 (two) times daily. Qty: 90 tablet, Refills: 6    linagliptin (TRADJENTA) 5 MG TABS tablet Take 5 mg  by mouth daily as needed. For blood sugar    lisinopril (PRINIVIL,ZESTRIL) 5 MG tablet Take 7.5 mg by mouth every morning.    Melatonin (RA MELATONIN) 10 MG TABS Take 5 mg by mouth at bedtime.    Multiple Vitamins-Minerals (PRESERVISION/LUTEIN) CAPS Take 1 capsule by mouth daily.  Refills: 0    nitroGLYCERIN (NITROSTAT) 0.4 MG SL tablet Place 1 tablet (0.4 mg total) under the tongue every 5 (five) minutes as needed. For chest pain Qty: 25 tablet, Refills: 3    omeprazole (PRILOSEC) 20 MG capsule Take 20 mg by mouth daily.    Polyethyl Glycol-Propyl Glycol (SYSTANE OP) Place 1 drop into both eyes as directed. 2-4 times a day      STOP taking these medications     aspirin 325 MG tablet - Pt was taking additional aspirin PRN. On AVS, it states "Since you are already on aspirin and Coumadin, you should not take any extra aspirin for pain relief. You may try Tylenol as directed over-the-counter instead."      metoprolol succinate (TOPROL-XL) 100 MG 24 hr tablet         Disposition   The patient will be discharged in stable condition to home. Discharge Orders   Future Appointments Provider Department Dept Phone   08/31/2013 10:30 AM Burtis Junes, NP [facility] [phone]   Future Orders Complete By Expires   Diet - low sodium heart healthy  As directed    Increase activity slowly  As directed    Scheduling Instructions:   No driving for 1 week (unless otherwise previously instructed by your doctor). No lifting over 10 lbs for 2 weeks. No sexual activity for 2 weeks. Keep procedure site clean & dry. If you notice increased pain, swelling, bleeding or pus, call/return!  You may shower, but no soaking baths/hot tubs/pools for 1 week.  Since you are already on aspirin and Coumadin, you should not take any extra aspirin for pain relief. You may try Tylenol as directed over-the-counter instead.     Follow-up Information   Follow up with Irven Shelling, MD.  (We have left a message on Dr. Delene Ruffini secretary's voicemail to call you for an appointment to have your Coumadin level checked on Monday (08/20/13). If you have not heard from them by tomorrow afternoon, please call their office.)    Specialty:  Internal Medicine   Contact information:   [address], [address] [place] [address] [phone]       Follow up with Truitt Merle, NP. (At Dr. Elmarie Shiley office -  08/31/13 at 10:30am)    Specialty:  Nurse Practitioner   Contact information:   [address] [place] [address] [phone]         Duration of Discharge Encounter: Greater than 30 minutes including physician and PA time.  Signed, Charlie Pitter PA-C 08/16/2013, 12:06 PM

## 2013-08-16 NOTE — Progress Notes (Signed)
Pt discharged to home per MD order. Pt received and reviewed all discharge instructions and medication information including follow-up appointments and prescription information. Pt verbalized understanding. Pt alert and oriented at discharge with no complaints of pain. Pt IV and telemetry box removed prior to discharge. Pt ambulated to private vehicle per pt request. [name] C [name]  

## 2013-08-16 NOTE — Progress Notes (Signed)
Patient: [name] / Admit Date: 08/14/2013 / Date of Encounter: 08/16/2013, 8:28 AM  Subjective  Feels great. No CP or SOB.  Objective   Telemetry: NSR frequent PACs, PVCs (longstanding hx of frequent ectopy)  Physical Exam: Blood pressure 145/74, pulse 54, temperature 97.3 F (36.3 C), temperature source Oral, resp. rate 18, height 5\' 9"  (1.753 m), weight 155 lb 8 oz (70.534 kg), SpO2 94.00%. General: Well developed, well nourished elderly WM, in no acute distress. Head: Normocephalic, atraumatic, sclera non-icteric, no xanthomas, nares are without discharge. Neck: Negative for carotid bruits. JVP not elevated. Lungs: Clear bilaterally to auscultation without wheezes, rales, or rhonchi. Breathing is unlabored. Heart: Regular rhythm with occasional ectopy, S1 S2 without murmurs, rubs, or gallops.  Abdomen: Soft, non-tender, non-distended with normoactive bowel sounds. No rebound/guarding. Extremities: No clubbing or cyanosis. No edema. Distal pedal pulses are 2+ and equal bilaterally. R groin without hematoma, bruit, or significant ecchymosis Neuro: Alert and oriented X 3. Moves all extremities spontaneously. Psych:  Responds to questions appropriately with a normal affect.   Intake/Output Summary (Last 24 hours) at 08/16/13 0828 Last data filed at 08/15/13 1900  Gross per 24 hour  Intake    400 ml  Output      0 ml  Net    400 ml    Inpatient Medications:  . atorvastatin  40 mg Oral q1800  . carvedilol  25 mg Oral BID WC  . glimepiride  4 mg Oral QPC breakfast  . insulin aspart  0-15 Units Subcutaneous TID WC  . insulin aspart  0-5 Units Subcutaneous QHS  . isosorbide mononitrate  90 mg Oral BID  . lisinopril  7.5 mg Oral q morning - 10a  . pantoprazole  40 mg Oral Daily  . Warfarin - Pharmacist Dosing Inpatient   Does not apply q1800   Infusions:    Labs:  Recent Labs  08/14/13 1010  NA 141  K 4.0  CL 103  CO2 24  GLUCOSE 186*  BUN 18  CREATININE 0.96    CALCIUM 9.2    Recent Labs  08/15/13 0157 08/16/13 0637  WBC 5.6 4.2  HGB 13.1 12.3*  HCT 37.5* 35.1*  MCV 100.3* 99.4  PLT 149* 131*    Recent Labs  08/14/13 1010 08/14/13 2037 08/15/13 0138 08/15/13 0716  TROPONINI <0.30 <0.30 0.35* 0.35*    Recent Labs  08/15/13 0157  HGBA1C 7.4*     Radiology/Studies:  Dg Chest Portable 1 View 08/14/2013   CLINICAL DATA:  Chest pain.  Diabetes.  EXAM: PORTABLE CHEST - 1 VIEW  COMPARISON:  Chest x-ray 12/13/2012.  FINDINGS: Mediastinum and hilar structures are normal. Cardiomegaly. Normal pulmonary vascularity. Mild interstitial prominence noted suggesting interstitial fibrosis. No pleural effusion or pneumothorax. No acute bony abnormality.  IMPRESSION: 1. Mild interstitial prominence noted suggesting interstitial fibrosis.  2. Cardiomegaly, normal pulmonary vascularity. Chest is stable from prior study .   Electronically Signed   By: [name]  Register   On: 08/14/2013 09:58    Assessment and Plan  1. NSTEMI (troponin 0.35)- prior history of DES to mLAD 2012; cath this admission 08/15/13 showing widely patent stent, 80% prox D1 (small vessel) with recommendation for medical management, residual 60% ostial PDA, <20% LCx, LVEF 55-65%. 2. PAF, in NSR (note: failed attempted at TEE/DCCV 07/2011 due to inability to pass probe) 3. Diabetes mellitus uncontrolled A1C 7.4 4. H/o PACs/PVCs 5. Aortic stenosis 6. Barrett's esophagus 7. Metastatic colon cancer (mesenteric lymph nodes per  onc notes) 8. Suspected low-grade myelodysplastic disease per onc notes  Toprol was changed to Coreg this admission due to elevated BP's on admission. BP is slightly up this morning but was well controlled yesterday. Will not make further changes at this time. Notes indicate for patient to continue amiodarone but he is not currently on this. Continue BB as above. Coumadin has been resumed. Recheck BMET this AM. If OK, likely to dc today with outpt followup.  Two  things for MD to weigh in on - which BB we planned to continue, and determination if OK to continue aspirin at home since he's not on it here.  Signed, [name] PA-C  Patient seen, examined. Available data reviewed. Agree with findings, assessment, and plan as outlined by [name], PA-C. Exam reveals stable right groin site without ecchymoses or hematoma. Lungs are clear. Heart is regular rate and rhythm. There is no peripheral edema. I have reviewed his cardiac catheterization results. Agree with plans as outlined above. Will resume aspirin 81 mg daily. The patient is on long-term warfarin. In yesterday's note, I indicated amiodarone for maintenance of sinus rhythm but it appears he has not been taking this at home. This was a mistake in the note. I would send him on carvedilol 25 mg twice daily. Will arrange followup with Dr [name].  [name], M.D. 08/16/2013 10:37 AM

## 2013-08-16 NOTE — Discharge Instructions (Addendum)
Groin Site Care °Refer to this sheet in the next few weeks. These instructions provide you with information on caring for yourself after your procedure. Your caregiver may also give you more specific instructions. Your treatment has been planned according to current medical practices, but problems sometimes occur. Call your caregiver if you have any problems or questions after your procedure. °HOME CARE INSTRUCTIONS °· You may shower the day after the procedure. Remove the bandage (dressing) and gently wash the site with plain soap and water. Gently pat the site dry. °· Do not apply powder or lotion to the site. °· Do not submerge the affected site in water for 3 to 5 days. °· Inspect the site at least twice daily. °· Do not flex or bend the affected arm for 24 hours. °· No lifting over 5 pounds (2.3 kg) for 5 days after your procedure. °· Do not drive home if you are discharged the same day of the procedure. Have someone else drive you. °· You may drive 24 hours after the procedure unless otherwise instructed by your caregiver. °· Do not operate machinery or power tools for 24 hours. °· A responsible adult should be with you for the first 24 hours after you arrive home. °What to expect: °· Any bruising will usually fade within 1 to 2 weeks. °· Blood that collects in the tissue (hematoma) may be painful to the touch. It should usually decrease in size and tenderness within 1 to 2 weeks. °SEEK IMMEDIATE MEDICAL CARE IF: °· You have unusual pain at the radial site. °· You have redness, warmth, swelling, or pain at the radial site. °· You have drainage (other than a small amount of blood on the dressing). °· You have chills. °· You have a fever or persistent symptoms for more than 72 hours. °· You have a fever and your symptoms suddenly get worse. °· Your arm becomes pale, cool, tingly, or numb. °· You have heavy bleeding from the site. Hold pressure on the site. °Document Released: 05/29/2010 Document Revised:  07/19/2011 Document Reviewed: 05/29/2010 °Exit[employer]® Patient Information ©2014 [employer]. ° °

## 2013-08-27 ENCOUNTER — Observation Stay (HOSPITAL_COMMUNITY)
Admission: EM | Admit: 2013-08-27 | Discharge: 2013-08-28 | Disposition: A | Discharge status: Home / Self Care | Payer: Medicare Other | Attending: Cardiovascular Disease | Admitting: Cardiovascular Disease

## 2013-08-27 ENCOUNTER — Other Ambulatory Visit: Payer: Self-pay

## 2013-08-27 ENCOUNTER — Encounter (HOSPITAL_COMMUNITY): Payer: Self-pay | Admitting: Emergency Medicine

## 2013-08-27 ENCOUNTER — Emergency Department (HOSPITAL_COMMUNITY): Payer: Medicare Other

## 2013-08-27 DIAGNOSIS — K2270 Barrett's esophagus without dysplasia: Secondary | ICD-10-CM | POA: Insufficient documentation

## 2013-08-27 DIAGNOSIS — I359 Nonrheumatic aortic valve disorder, unspecified: Secondary | ICD-10-CM | POA: Insufficient documentation

## 2013-08-27 DIAGNOSIS — E785 Hyperlipidemia, unspecified: Secondary | ICD-10-CM

## 2013-08-27 DIAGNOSIS — Z85038 Personal history of other malignant neoplasm of large intestine: Secondary | ICD-10-CM | POA: Insufficient documentation

## 2013-08-27 DIAGNOSIS — Z9861 Coronary angioplasty status: Secondary | ICD-10-CM | POA: Insufficient documentation

## 2013-08-27 DIAGNOSIS — I2089 Other forms of angina pectoris: Secondary | ICD-10-CM

## 2013-08-27 DIAGNOSIS — I4891 Unspecified atrial fibrillation: Secondary | ICD-10-CM

## 2013-08-27 DIAGNOSIS — R262 Difficulty in walking, not elsewhere classified: Secondary | ICD-10-CM | POA: Insufficient documentation

## 2013-08-27 DIAGNOSIS — D469 Myelodysplastic syndrome, unspecified: Secondary | ICD-10-CM | POA: Insufficient documentation

## 2013-08-27 DIAGNOSIS — I509 Heart failure, unspecified: Secondary | ICD-10-CM | POA: Insufficient documentation

## 2013-08-27 DIAGNOSIS — I5022 Chronic systolic (congestive) heart failure: Secondary | ICD-10-CM | POA: Insufficient documentation

## 2013-08-27 DIAGNOSIS — R079 Chest pain, unspecified: Secondary | ICD-10-CM

## 2013-08-27 DIAGNOSIS — I10 Essential (primary) hypertension: Secondary | ICD-10-CM

## 2013-08-27 DIAGNOSIS — I2510 Atherosclerotic heart disease of native coronary artery without angina pectoris: Secondary | ICD-10-CM

## 2013-08-27 DIAGNOSIS — K219 Gastro-esophageal reflux disease without esophagitis: Secondary | ICD-10-CM | POA: Insufficient documentation

## 2013-08-27 DIAGNOSIS — Z87891 Personal history of nicotine dependence: Secondary | ICD-10-CM | POA: Insufficient documentation

## 2013-08-27 DIAGNOSIS — E119 Type 2 diabetes mellitus without complications: Secondary | ICD-10-CM

## 2013-08-27 DIAGNOSIS — Z7901 Long term (current) use of anticoagulants: Secondary | ICD-10-CM | POA: Insufficient documentation

## 2013-08-27 DIAGNOSIS — I252 Old myocardial infarction: Secondary | ICD-10-CM | POA: Insufficient documentation

## 2013-08-27 DIAGNOSIS — I480 Paroxysmal atrial fibrillation: Secondary | ICD-10-CM | POA: Diagnosis present

## 2013-08-27 DIAGNOSIS — I429 Cardiomyopathy, unspecified: Secondary | ICD-10-CM

## 2013-08-27 DIAGNOSIS — Z7982 Long term (current) use of aspirin: Secondary | ICD-10-CM | POA: Insufficient documentation

## 2013-08-27 DIAGNOSIS — I208 Other forms of angina pectoris: Secondary | ICD-10-CM

## 2013-08-27 LAB — CBC WITH DIFFERENTIAL/PLATELET
BASOS ABS: 0 10*3/uL (ref 0.0–0.1)
Basophils Relative: 0 % (ref 0–1)
EOS PCT: 7 % — AB (ref 0–5)
Eosinophils Absolute: 0.3 10*3/uL (ref 0.0–0.7)
HCT: 37.1 % — ABNORMAL LOW (ref 39.0–52.0)
Hemoglobin: 13 g/dL (ref 13.0–17.0)
LYMPHS ABS: 0.9 10*3/uL (ref 0.7–4.0)
LYMPHS PCT: 19 % (ref 12–46)
MCH: 35.3 pg — ABNORMAL HIGH (ref 26.0–34.0)
MCHC: 35 g/dL (ref 30.0–36.0)
MCV: 100.8 fL — ABNORMAL HIGH (ref 78.0–100.0)
Monocytes Absolute: 0.6 10*3/uL (ref 0.1–1.0)
Monocytes Relative: 11 % (ref 3–12)
Neutro Abs: 3.1 10*3/uL (ref 1.7–7.7)
Neutrophils Relative %: 63 % (ref 43–77)
Platelets: 156 10*3/uL (ref 150–400)
RBC: 3.68 MIL/uL — ABNORMAL LOW (ref 4.22–5.81)
RDW: 13.1 % (ref 11.5–15.5)
WBC: 4.9 10*3/uL (ref 4.0–10.5)

## 2013-08-27 LAB — BASIC METABOLIC PANEL
BUN: 21 mg/dL (ref 6–23)
CO2: 23 meq/L (ref 19–32)
CREATININE: 0.92 mg/dL (ref 0.50–1.35)
Calcium: 9.3 mg/dL (ref 8.4–10.5)
Chloride: 109 mEq/L (ref 96–112)
GFR calc Af Amer: 85 mL/min — ABNORMAL LOW (ref >90)
GFR calc non Af Amer: 73 mL/min — ABNORMAL LOW (ref >90)
GLUCOSE: 89 mg/dL (ref 70–99)
Potassium: 3.9 mEq/L (ref 3.7–5.3)
Sodium: 144 mEq/L (ref 137–147)

## 2013-08-27 LAB — I-STAT TROPONIN, ED: Troponin i, poc: 0 ng/mL (ref 0.00–0.08)

## 2013-08-27 NOTE — ED Notes (Signed)
Per EMS, pt was standing in line at [employer] and began to have left sided chest pressure. Pt reports this pain was 5 out 10 at its worst. Pt denies SOB, nausea and dizziness. Pt was given 324 of aspirin and 2 nitros in route. Pt denies cp at this time. Pt alert x 4. NAD at this time. Pt has 20 ga left ac.

## 2013-08-27 NOTE — ED Provider Notes (Signed)
CSN: [id number]     Arrival date & time 08/27/13  2201 History   First MD Initiated Contact with Patient 08/27/13 2245     Chief Complaint  Patient presents with  . Chest Pain      HPI  Patient presents with chest tightness and left shoulder pain. Has history of coronary disease status post PTCA. Most recent intervention diagnostics were April 8. He had  a minimal bump of his troponin and a non-STEMI. Cath showed patent grafts. 60% diagonal. No interventions performed. Medical management recommended. Cardiologist is Dr. Cathie Olden. He was asymptomatic for over a week. For the last 3 nights he's had symptoms at night with shortness of breath even in his home. Tonight he was at [employer]. He felt short of breath walking in. He had chest tightness while waiting for her prescription. He had left shoulder pain. States had a great deal of difficulty getting out to his car due to the symptoms. He drove home he took 3 nitroglycerin and his pain improved but did not resolve. Given additional nitroglycerin by EMS and he arrives here symptom-free.  Past Medical History  Diagnosis Date  . Coronary artery disease     a. s/p PCI w/ DES to mLAD 08/05/10. b. NSTEMI 08/2013 (mildly elev trop) - cath showing widely patent stent, 80% prox D1 (small vessel) with recommendation for medical management, residual 60% ostial PDA, <20% LCx, LVEF 55-65%.   . Hypertension   . Hyperlipidemia   . Barrett esophagus   . Gallstone pancreatitis 2011    a. s/p chole.  . SBO (small bowel obstruction) 2005  . Macrocytic anemia 06/25/2011  . PAF (paroxysmal atrial fibrillation)     a. failed TEE due to inability to pass probe into the esophagus in March 2013. b. On Coumadin. Was in NSR 08/2013 admission.  . Chronic anticoagulation   . Depression   . GERD (gastroesophageal reflux disease)   . Colon cancer     a. 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 b.  "graduated" from their practice 06/2013, remains free of any new  disease per those notes.  . Type II diabetes mellitus   . Premature atrial contractions   . Premature ventricular contractions   . Aortic stenosis     a. Mild-mod by echo 2013.  [name] Chronic systolic CHF (congestive heart failure)     a. EF previously 35-40%. b. improved to 55-65% by cath 08/2013.  . Myelodysplastic disease     a. Suspected low-grade  myelodysplastic disease per onc notes.   Past Surgical History  Procedure Laterality Date  . Cholecystectomy  2011  . Right colectomy    . Coronary angioplasty with stent placement  08/05/10    DES to the LAD  . Cardiac catheterization  08/03/10  . Cardiac catheterization  08/09/10    LAD 30, stent OK, CFX 40, RCA < 20  . Abdominal mass resection      Mass near mesentery  . Cystoscopy      "with removal of kidney stone in office"  . Cataract extraction Bilateral   . Appendectomy      "took out during colon surgery"  . Cystoscopy with retrograde pyelogram, ureteroscopy and stent placement Bilateral 05/18/2013    Procedure: CYSTOSCOPY WITH BILATERAL RETROGRADE PYELOGRAM, LEFT URETEROSCOPY AND LEFT STENT PLACEMENT;  Surgeon: Alexis Frock, MD;  Location: [facility];  Service: Urology;  Laterality: Bilateral;  . Colon surgery    . Tonsillectomy     Family History  Problem Relation Age of Onset  . Cancer Mother   . Ulcers Father    History  Substance Use Topics  . Smoking status: Former Smoker -- 1.00 packs/day for 30 years    Types: Cigarettes    Quit date: 05/10/1994  . Smokeless tobacco: Never Used     Comment: smoked off and on  . Alcohol Use: 1.2 oz/week    2 Shots of liquor per week     Comment: 08/14/2013 "shot of scotch maybe twice/wk"    Review of Systems  Constitutional: Negative for fever, chills, diaphoresis, appetite change and fatigue.  HENT: Negative for mouth sores, sore throat and trouble swallowing.   Eyes: Negative for visual disturbance.  Respiratory: Positive for chest tightness and shortness of breath. Negative for  cough and wheezing.   Cardiovascular: Positive for chest pain.  Gastrointestinal: Negative for nausea, vomiting, abdominal pain, diarrhea and abdominal distention.  Endocrine: Negative for polydipsia, polyphagia and polyuria.  Genitourinary: Negative for dysuria, frequency and hematuria.  Musculoskeletal: Negative for gait problem.  Skin: Negative for color change, pallor and rash.  Neurological: Negative for dizziness, syncope, light-headedness and headaches.  Hematological: Does not bruise/bleed easily.  Psychiatric/Behavioral: Negative for behavioral problems and confusion.      Allergies  Metformin and related  Home Medications   Prior to Admission medications   Medication Sig Start Date End Date Taking? Authorizing Provider  aspirin EC 81 MG tablet Take 81 mg by mouth daily.   Yes Historical Provider, MD  atorvastatin (LIPITOR) 40 MG tablet Take 40 mg by mouth every morning.    Yes Historical Provider, MD  carvedilol (COREG) 25 MG tablet Take 1 tablet (25 mg total) by mouth 2 (two) times daily with a meal. 08/16/13  Yes Dayna N Dunn, PA-C  Cholecalciferol (VITAMIN D-3 PO) Take 1 tablet by mouth daily.    Yes Historical Provider, MD  glimepiride (AMARYL) 4 MG tablet Take 4 mg by mouth daily after breakfast.    Yes Historical Provider, MD  isosorbide mononitrate (IMDUR) 60 MG 24 hr tablet Take 1.5 tablets (90 mg total) by mouth 2 (two) times daily. 01/17/13 01/17/14 Yes Thayer Headings, MD  linagliptin (TRADJENTA) 5 MG TABS tablet Take 5 mg by mouth daily as needed. For blood sugar   Yes Historical Provider, MD  lisinopril (PRINIVIL,ZESTRIL) 5 MG tablet Take 7.5 mg by mouth every morning.   Yes Historical Provider, MD  Melatonin (RA MELATONIN) 10 MG TABS Take 5 mg by mouth at bedtime.   Yes Historical Provider, MD  metoprolol succinate (TOPROL-XL) 100 MG 24 hr tablet Take 1 tablet by mouth daily. 07/18/13  Yes Historical Provider, MD  Multiple Vitamins-Minerals (PRESERVISION/LUTEIN) CAPS  Take 1 capsule by mouth daily.  08/13/10  Yes Romeo Apple, MD  nitroGLYCERIN (NITROSTAT) 0.4 MG SL tablet Place 1 tablet (0.4 mg total) under the tongue every 5 (five) minutes as needed. For chest pain 12/18/12  Yes Thayer Headings, MD  omeprazole (PRILOSEC) 20 MG capsule Take 20 mg by mouth daily.   Yes Historical Provider, MD  Polyethyl Glycol-Propyl Glycol (SYSTANE OP) Place 1 drop into both eyes as directed. 2-4 times a day   Yes Historical Provider, MD  warfarin (COUMADIN) 4 MG tablet Take 4 mg by mouth daily. 08/16/13  Yes Dayna N Dunn, PA-C   BP 169/66  Pulse 81  Temp(Src) 97.9 F (36.6 C) (Oral)  Resp 20  SpO2 97% Physical Exam  Constitutional: He is oriented to person, place, and  time. He appears well-developed and well-nourished. No distress.  80 year old male awake alert oriented lucid. Appears younger than his stated age.  HENT:  Head: Normocephalic.  Eyes: Conjunctivae are normal. Pupils are equal, round, and reactive to light. No scleral icterus.  Neck: Normal range of motion. Neck supple. No thyromegaly present.  Cardiovascular: Normal rate and regular rhythm.  Exam reveals no gallop and no friction rub.   No murmur heard. Pulmonary/Chest: Effort normal and breath sounds normal. No respiratory distress. He has no wheezes. He has no rales.  Abdominal: Soft. Bowel sounds are normal. He exhibits no distension. There is no tenderness. There is no rebound.  Musculoskeletal: Normal range of motion.  Neurological: He is alert and oriented to person, place, and time.  Skin: Skin is warm and dry. No rash noted.  Psychiatric: He has a normal mood and affect. His behavior is normal.    ED Course  Procedures (including critical care time) Labs Review Labs Reviewed  CBC WITH DIFFERENTIAL - Abnormal; Notable for the following:    RBC 3.68 (*)    HCT 37.1 (*)    MCV 100.8 (*)    MCH 35.3 (*)    Eosinophils Relative 7 (*)    All other components within normal limits  BASIC  METABOLIC PANEL - Abnormal; Notable for the following:    GFR calc non Af Amer 73 (*)    GFR calc Af Amer 85 (*)    All other components within normal limits  I-STAT TROPOININ, ED    Imaging Review Dg Chest Portable 1 View  08/27/2013   CLINICAL DATA:  Chest pain  EXAM: PORTABLE CHEST - 1 VIEW  COMPARISON:  DG CHEST 1V PORT dated 08/14/2013; DG CHEST 2 VIEW dated 12/13/2012  FINDINGS: Diffuse interstitial prominence is stable. Mild cardiomegaly is stable. No pneumothorax. No pleural effusion.  IMPRESSION: Diffuse interstitial prominence.  Stable.   Electronically Signed   By: Maryclare Bean M.D.   On: 08/27/2013 23:33     EKG Interpretation None      MDM   Final diagnoses:  Angina at rest    EKG shows sinus rhythm. Frequent PVCs and PACs. No acute changes noted. The on his arrival. He has had aspirin. Awaiting his enzymes.  Remains symptom free. No change in EKG. Normal troponin. I discussed the case with Dr. Colon Flattery.  Pt to be admitted.    Tanna Furry, MD 08/28/13 [phone]

## 2013-08-28 ENCOUNTER — Encounter (HOSPITAL_COMMUNITY): Payer: Self-pay | Admitting: *Deleted

## 2013-08-28 ENCOUNTER — Other Ambulatory Visit: Payer: Self-pay

## 2013-08-28 ENCOUNTER — Telehealth: Payer: Self-pay

## 2013-08-28 DIAGNOSIS — E785 Hyperlipidemia, unspecified: Secondary | ICD-10-CM

## 2013-08-28 DIAGNOSIS — I4891 Unspecified atrial fibrillation: Secondary | ICD-10-CM

## 2013-08-28 DIAGNOSIS — R079 Chest pain, unspecified: Secondary | ICD-10-CM | POA: Diagnosis present

## 2013-08-28 DIAGNOSIS — I2510 Atherosclerotic heart disease of native coronary artery without angina pectoris: Secondary | ICD-10-CM

## 2013-08-28 DIAGNOSIS — I359 Nonrheumatic aortic valve disorder, unspecified: Secondary | ICD-10-CM

## 2013-08-28 DIAGNOSIS — I428 Other cardiomyopathies: Secondary | ICD-10-CM

## 2013-08-28 DIAGNOSIS — I10 Essential (primary) hypertension: Secondary | ICD-10-CM

## 2013-08-28 DIAGNOSIS — I209 Angina pectoris, unspecified: Secondary | ICD-10-CM

## 2013-08-28 DIAGNOSIS — E119 Type 2 diabetes mellitus without complications: Secondary | ICD-10-CM

## 2013-08-28 LAB — BASIC METABOLIC PANEL
BUN: 18 mg/dL (ref 6–23)
CALCIUM: 9.3 mg/dL (ref 8.4–10.5)
CHLORIDE: 107 meq/L (ref 96–112)
CO2: 23 mEq/L (ref 19–32)
Creatinine, Ser: 0.9 mg/dL (ref 0.50–1.35)
GFR calc Af Amer: 86 mL/min — ABNORMAL LOW (ref >90)
GFR calc non Af Amer: 74 mL/min — ABNORMAL LOW (ref >90)
Glucose, Bld: 93 mg/dL (ref 70–99)
Potassium: 3.5 mEq/L — ABNORMAL LOW (ref 3.7–5.3)
Sodium: 142 mEq/L (ref 137–147)

## 2013-08-28 LAB — CBC
HEMATOCRIT: 36.5 % — AB (ref 39.0–52.0)
HEMOGLOBIN: 12.5 g/dL — AB (ref 13.0–17.0)
MCH: 34.2 pg — ABNORMAL HIGH (ref 26.0–34.0)
MCHC: 34.2 g/dL (ref 30.0–36.0)
MCV: 99.7 fL (ref 78.0–100.0)
Platelets: 138 10*3/uL — ABNORMAL LOW (ref 150–400)
RBC: 3.66 MIL/uL — ABNORMAL LOW (ref 4.22–5.81)
RDW: 13.1 % (ref 11.5–15.5)
WBC: 5.2 10*3/uL (ref 4.0–10.5)

## 2013-08-28 LAB — PROTIME-INR
INR: 2.2 — AB (ref 0.00–1.49)
INR: 2.11 — ABNORMAL HIGH (ref 0.00–1.49)
Prothrombin Time: 23.7 seconds — ABNORMAL HIGH (ref 11.6–15.2)
Prothrombin Time: 23 seconds — ABNORMAL HIGH (ref 11.6–15.2)

## 2013-08-28 LAB — MAGNESIUM: Magnesium: 2 mg/dL (ref 1.5–2.5)

## 2013-08-28 LAB — GLUCOSE, CAPILLARY
GLUCOSE-CAPILLARY: 127 mg/dL — AB (ref 70–99)
Glucose-Capillary: 91 mg/dL (ref 70–99)
Glucose-Capillary: 165 mg/dL — ABNORMAL HIGH (ref 70–99)

## 2013-08-28 LAB — TROPONIN I: Troponin I: <0.3 ng/mL (ref <0.30)

## 2013-08-28 MED ORDER — ISOSORBIDE MONONITRATE ER 60 MG PO TB24
90.0000 mg | ORAL_TABLET | Freq: Two times a day (BID) | ORAL | Status: DC
  Administered 2013-08-28: 90 mg via ORAL
  Filled 2013-08-28 (×2): qty 1

## 2013-08-28 MED ORDER — PANTOPRAZOLE SODIUM 40 MG PO TBEC
40.0000 mg | DELAYED_RELEASE_TABLET | Freq: Every day | ORAL | Status: DC
  Administered 2013-08-28: 40 mg via ORAL

## 2013-08-28 MED ORDER — WARFARIN SODIUM 4 MG PO TABS
4.0000 mg | ORAL_TABLET | Freq: Every day | ORAL | Status: DC
  Filled 2013-08-28: qty 1

## 2013-08-28 MED ORDER — INSULIN ASPART 100 UNIT/ML ~~LOC~~ SOLN: 0.0000 [IU] | Freq: Every day | SUBCUTANEOUS | Status: DC

## 2013-08-28 MED ORDER — CARVEDILOL 25 MG PO TABS
25.0000 mg | ORAL_TABLET | Freq: Two times a day (BID) | ORAL | Status: DC
  Administered 2013-08-28: 25 mg via ORAL
  Filled 2013-08-28 (×3): qty 1

## 2013-08-28 MED ORDER — INSULIN ASPART 100 UNIT/ML ~~LOC~~ SOLN
0.0000 [IU] | Freq: Four times a day (QID) | SUBCUTANEOUS | Status: DC
  Administered 2013-08-28: 1 [IU] via SUBCUTANEOUS

## 2013-08-28 MED ORDER — LISINOPRIL 5 MG PO TABS
7.5000 mg | ORAL_TABLET | Freq: Every morning | ORAL | Status: DC
  Administered 2013-08-28: 7.5 mg via ORAL
  Filled 2013-08-28: qty 1

## 2013-08-28 MED ORDER — ASPIRIN EC 81 MG PO TBEC
81.0000 mg | DELAYED_RELEASE_TABLET | Freq: Every day | ORAL | Status: DC
  Administered 2013-08-28: 81 mg via ORAL
  Filled 2013-08-28: qty 1

## 2013-08-28 MED ORDER — NITROGLYCERIN 0.4 MG SL SUBL: 0.4000 mg | SUBLINGUAL_TABLET | SUBLINGUAL | Status: DC | PRN

## 2013-08-28 MED ORDER — ATORVASTATIN CALCIUM 40 MG PO TABS
40.0000 mg | ORAL_TABLET | Freq: Every morning | ORAL | Status: DC
  Administered 2013-08-28: 40 mg via ORAL
  Filled 2013-08-28: qty 1

## 2013-08-28 MED ORDER — RANOLAZINE ER 500 MG PO TB12: 500.0000 mg | ORAL_TABLET | Freq: Two times a day (BID) | ORAL | Status: DC

## 2013-08-28 MED ORDER — HYDRALAZINE HCL 25 MG PO TABS
25.0000 mg | ORAL_TABLET | Freq: Once | ORAL | Status: AC
  Administered 2013-08-28: 25 mg via ORAL
  Filled 2013-08-28: qty 1

## 2013-08-28 MED ORDER — WARFARIN - PHYSICIAN DOSING INPATIENT: Freq: Every day | Status: DC

## 2013-08-28 MED ORDER — PROSIGHT PO TABS
1.0000 | ORAL_TABLET | Freq: Every day | ORAL | Status: DC
  Administered 2013-08-28: 1 via ORAL
  Filled 2013-08-28: qty 1

## 2013-08-28 MED ORDER — SODIUM CHLORIDE 0.9 % IV SOLN
INTRAVENOUS | Status: AC
  Administered 2013-08-28: 03:00:00 via INTRAVENOUS

## 2013-08-28 MED ORDER — PRESERVISION/LUTEIN PO CAPS: 1.0000 | ORAL_CAPSULE | Freq: Every day | ORAL | Status: DC

## 2013-08-28 NOTE — ED Notes (Signed)
This RN went in and spoke to pt about him being upset. Pt verbalized that he was upset about the wait. This RN spoke to the pt about how long he had been waiting pt verbalized that he understood and was speaking in a calm voice. NAD at this time.

## 2013-08-28 NOTE — ED Notes (Signed)
Cardiologist at  Bedside. 

## 2013-08-28 NOTE — H&P (Signed)
Cardiology History and Physical  Irven Shelling, MD  History of Present Illness (and review of medical records): [name] is a 80 y.o. male with a history of chronic PVC's and PACs, PAF, Barretts esophagus, colon cancer, HTN, DM, HLD and chronic systolic heart failure, mod AS, CAD s/p DES to mLAD 08/05/10, last Cath 08/2013 with patent stents after recent NSTEMI, p/w chest pain x 1 day.  He states his chest pain felt like a tightness similar to his prior MIs.  He developed chest tightness around 430pm while at the pharmacy.  Afterwards he had difficulty walking to his car, felt like he was not going to make it.  He got home in 18mins and rested with no relief.  He then took ASA and NTG x 3 with mild relief. Pain went from 7-8/10 down to 3-4/10.  He then called EMS after talking with his family.  He was given more NTG en route by EMS.  He is now chest pain free.  He denied any other associated symptoms.  He no acute changes on ecg and troponin was negative.  Cath: Procedural Findings:  Hemodynamics:  AO 107/52 mean 75 mm Hg  LV 106/6 mm Hg  Coronary angiography:  Coronary dominance: right  Left mainstem: Normal.  Left anterior descending (LAD): The stent in the LAD is widely patent. There is 30% disease in the mid and distal vessel. The first diagonal is small and has a 80% stenosis proximally.  Left circumflex (LCx): Mild disease less than 20%.  Right coronary artery (RCA): Very large dominant vessel with mild irregularities. There is focal 60% stenosis in the ostium of the PDA.  Left ventriculography: Left ventricular systolic function is normal, LVEF is estimated at 55-65%, there is no significant mitral regurgitation  Final Conclusions:  1. Single vessel obstructive CAD involving a small diagonal branch. The LAD stent is patent.  2. Normal LV function.  Recommendations: Continue medical therapy. Will resume coumadin per pharmacy.  Ander Slade [facility]  08/15/2013, 12:07 PM    Review of Systems Pertinent items are noted in HPI. Further review of systems was otherwise negative other than stated in HPI.   Past Medical History  Diagnosis Date  . Coronary artery disease     a. s/p PCI w/ DES to mLAD 08/05/10. b. NSTEMI 08/2013 (mildly elev trop) - cath showing widely patent stent, 80% prox D1 (small vessel) with recommendation for medical management, residual 60% ostial PDA, <20% LCx, LVEF 55-65%.   . Hypertension   . Hyperlipidemia   . Barrett esophagus   . Gallstone pancreatitis 2011    a. s/p chole.  . SBO (small bowel obstruction) 2005  . Macrocytic anemia 06/25/2011  . PAF (paroxysmal atrial fibrillation)     a. failed TEE due to inability to pass probe into the esophagus in March 2013. b. On Coumadin. Was in NSR 08/2013 admission.  . Chronic anticoagulation   . Depression   . GERD (gastroesophageal reflux disease)   . Colon cancer     a. 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 b.  "graduated" from their practice 06/2013, remains free of any new disease per those notes.  . Type II diabetes mellitus   . Premature atrial contractions   . Premature ventricular contractions   . Aortic stenosis     a. Mild-mod by echo 2013.  [name] Chronic systolic CHF (congestive heart failure)     a. EF previously 35-40%. b. improved to 55-65% by cath 08/2013.  [name]  Myelodysplastic disease     a. Suspected low-grade  myelodysplastic disease per onc notes.    Past Surgical History  Procedure Laterality Date  . Cholecystectomy  2011  . Right colectomy    . Coronary angioplasty with stent placement  08/05/10    DES to the LAD  . Cardiac catheterization  08/03/10  . Cardiac catheterization  08/09/10    LAD 30, stent OK, CFX 40, RCA < 20  . Abdominal mass resection      Mass near mesentery  . Cystoscopy      "with removal of kidney stone in office"  . Cataract extraction Bilateral   . Appendectomy      "took out during colon surgery"  . Cystoscopy with retrograde  pyelogram, ureteroscopy and stent placement Bilateral 05/18/2013    Procedure: CYSTOSCOPY WITH BILATERAL RETROGRADE PYELOGRAM, LEFT URETEROSCOPY AND LEFT STENT PLACEMENT;  Surgeon: Alexis Frock, MD;  Location: [facility];  Service: Urology;  Laterality: Bilateral;  . Colon surgery    . Tonsillectomy      Prescriptions prior to admission  Medication Sig Dispense Refill  . aspirin EC 81 MG tablet Take 81 mg by mouth daily.      [name] atorvastatin (LIPITOR) 40 MG tablet Take 40 mg by mouth every morning.       . carvedilol (COREG) 25 MG tablet Take 1 tablet (25 mg total) by mouth 2 (two) times daily with a meal.  60 tablet  6  . Cholecalciferol (VITAMIN D-3 PO) Take 1 tablet by mouth daily.       [name] glimepiride (AMARYL) 4 MG tablet Take 4 mg by mouth daily after breakfast.       . isosorbide mononitrate (IMDUR) 60 MG 24 hr tablet Take 1.5 tablets (90 mg total) by mouth 2 (two) times daily.  90 tablet  6  . linagliptin (TRADJENTA) 5 MG TABS tablet Take 5 mg by mouth daily as needed. For blood sugar      . lisinopril (PRINIVIL,ZESTRIL) 5 MG tablet Take 7.5 mg by mouth every morning.      . Melatonin (RA MELATONIN) 10 MG TABS Take 5 mg by mouth at bedtime.      . Multiple Vitamins-Minerals (PRESERVISION/LUTEIN) CAPS Take 1 capsule by mouth daily.     0  . nitroGLYCERIN (NITROSTAT) 0.4 MG SL tablet Place 1 tablet (0.4 mg total) under the tongue every 5 (five) minutes as needed. For chest pain  25 tablet  3  . omeprazole (PRILOSEC) 20 MG capsule Take 20 mg by mouth daily.      Vladimir Faster Glycol-Propyl Glycol (SYSTANE OP) Place 1 drop into both eyes as directed. 2-4 times a day      . warfarin (COUMADIN) 4 MG tablet Take 4 mg by mouth daily.       Allergies  Allergen Reactions  . Metformin And Related Nausea And Vomiting    History  Substance Use Topics  . Smoking status: Former Smoker -- 1.00 packs/day for 30 years    Types: Cigarettes    Quit date: 05/10/1994  . Smokeless tobacco: Never Used      Comment: smoked off and on  . Alcohol Use: 1.2 oz/week    2 Shots of liquor per week     Comment: 08/14/2013 "shot of scotch maybe twice/wk"    Family History  Problem Relation Age of Onset  . Cancer Mother   . Ulcers Father      Objective:  Patient Vitals for the past  8 hrs:  BP Temp Temp src Pulse Resp SpO2 Height Weight  08/28/13 0224 181/70 mmHg 97.8 F (36.6 C) Oral 60 18 99 % $Re'5\' 9"'NNi$  (1.753 m) 72.938 kg (160 lb 12.8 oz)  08/28/13 0159 167/77 mmHg - - 50 18 93 % - -  08/28/13 0103 175/78 mmHg - - 65 18 96 % - -  08/27/13 2345 169/66 mmHg - - 81 20 97 % - -  08/27/13 2300 144/76 mmHg - - - 15 96 % - -  08/27/13 2215 180/101 mmHg 97.9 F (36.6 C) Oral 82 - 96 % - -   General appearance:elderly appearing male no distress Head: Normocephalic, without obvious abnormality, atraumatic Eyes: conjunctivae/corneas clear. PERRL, EOM's intact. Fundi benign. Neck:  no JVD and supple,  Lungs: clear to auscultation bilaterally Chest wall: no tenderness Heart: regular rate and rhythm, S1, S2 normal,  Abdomen: soft, non-tender; bowel sounds normal; no masses,  no organomegaly Extremities: extremities normal, atraumatic, no cyanosis or edema Pulses: 2+ and symmetric Neurologic: Grossly normal  Results for orders placed during the hospital encounter of 08/27/13 (from the past 48 hour(s))  CBC WITH DIFFERENTIAL     Status: Abnormal   Collection Time    08/27/13 11:07 PM      Result Value Ref Range   WBC 4.9  4.0 - 10.5 K/uL   RBC 3.68 (*) 4.22 - 5.81 MIL/uL   Hemoglobin 13.0  13.0 - 17.0 g/dL   HCT 37.1 (*) 39.0 - 52.0 %   MCV 100.8 (*) 78.0 - 100.0 fL   MCH 35.3 (*) 26.0 - 34.0 pg   MCHC 35.0  30.0 - 36.0 g/dL   RDW 13.1  11.5 - 15.5 %   Platelets 156  150 - 400 K/uL   Neutrophils Relative % 63  43 - 77 %   Neutro Abs 3.1  1.7 - 7.7 K/uL   Lymphocytes Relative 19  12 - 46 %   Lymphs Abs 0.9  0.7 - 4.0 K/uL   Monocytes Relative 11  3 - 12 %   Monocytes Absolute 0.6  0.1 - 1.0 K/uL    Eosinophils Relative 7 (*) 0 - 5 %   Eosinophils Absolute 0.3  0.0 - 0.7 K/uL   Basophils Relative 0  0 - 1 %   Basophils Absolute 0.0  0.0 - 0.1 K/uL  BASIC METABOLIC PANEL     Status: Abnormal   Collection Time    08/27/13 11:07 PM      Result Value Ref Range   Sodium 144  137 - 147 mEq/L   Potassium 3.9  3.7 - 5.3 mEq/L   Chloride 109  96 - 112 mEq/L   CO2 23  19 - 32 mEq/L   Glucose, Bld 89  70 - 99 mg/dL   BUN 21  6 - 23 mg/dL   Creatinine, Ser 0.92  0.50 - 1.35 mg/dL   Calcium 9.3  8.4 - 10.5 mg/dL   GFR calc non Af Amer 73 (*) >90 mL/min   GFR calc Af Amer 85 (*) >90 mL/min   Comment: (NOTE)     The eGFR has been calculated using the CKD EPI equation.     This calculation has not been validated in all clinical situations.     eGFR's persistently <90 mL/min signify possible Chronic Kidney     Disease.  Randolm Idol, ED     Status: None   Collection Time    08/27/13 11:11 PM  Result Value Ref Range   Troponin i, poc 0.00  0.00 - 0.08 ng/mL   Comment 3            Comment: Due to the release kinetics of cTnI,     a negative result within the first hours     of the onset of symptoms does not rule out     myocardial infarction with certainty.     If myocardial infarction is still suspected,     repeat the test at appropriate intervals.  PROTIME-INR     Status: Abnormal   Collection Time    08/28/13  1:43 AM      Result Value Ref Range   Prothrombin Time 23.7 (*) 11.6 - 15.2 seconds   INR 2.20 (*) 0.00 - 1.49   Dg Chest Portable 1 View  08/27/2013   CLINICAL DATA:  Chest pain  EXAM: PORTABLE CHEST - 1 VIEW  COMPARISON:  DG CHEST 1V PORT dated 08/14/2013; DG CHEST 2 VIEW dated 12/13/2012  FINDINGS: Diffuse interstitial prominence is stable. Mild cardiomegaly is stable. No pneumothorax. No pleural effusion.  IMPRESSION: Diffuse interstitial prominence.  Stable.   Electronically Signed   By: Maryclare Bean M.D.   On: 08/27/2013 23:33    ECG:  Sinus rhythm HR 69, RBBB,  frequent PVCs, no significant change from prior  Assessment: 80y.o male who was recent admitted with NSTEMI.  Underwent cath which revealed patient stents on 08/15/13 and 80% prox D1 (small vessel) returns with chest pain.  1. Chest pain, probably chronic stable angina, r/o MI known CAD s/p recent NSTEMI 2. Paroxysmal atrial fibrillation, maintaining NSR, on Coumadin  3. Diabetes mellitus uncontrolled A1C 7.4  4. H/o PACs/PVCs  5. Aortic stenosis, mild-moderate by echo 2013  6. HTN  7. Hyperlipidemia  8. Chronic systolic CHF - EF previously 35-40%, improved to 55-65% by cath last admission   Plan: 1. Cardiology Admission  2. Continuous monitoring on Telemetry. 3. Repeat ekg on admit, prn chest pain or arrythmia 4. Cycle cardiac biomarkers 5. Medical management to include ASA, BB, ACEi, Statin, Imdur NTG prn 6. Continue Coumadin, monitor INR 7. SSI for DM 8. Given recent Cath likely no need for repeat invasive evaluation. Plan for medical management.  Will reassess in am.

## 2013-08-28 NOTE — Progress Notes (Signed)
Patient discharged to home.  Patient alert, oriented, verbally responsive, breathing regular and non-labored throughout, no s/s of distress noted throughout, no c/o pain throughout.  Discharge instructions verbalized to patient.  Patient verbalized understanding throughout.  Patient refused wheelchair and left unit ambulating accompanied by family member.  VS WNL.  Dirk Dress 08/28/2013

## 2013-08-28 NOTE — Discharge Summary (Signed)
Physician Discharge Summary     Patient ID: [name] MRN: [id number] DOB/AGE: 1924-02-17 80 y.o.  Cardiologist:  Nasher  Admit date: 08/27/2013 Discharge date: 08/28/2013  Admission Diagnoses:    Chest pain with moderate risk of acute coronary syndrome  Discharge Diagnoses:  Active Problems:   Hypertension   Diabetes mellitus   Hyperlipidemia   Coronary artery disease   Paroxysmal Atrial fibrillation   Chest pain with moderate risk of acute coronary syndrome   Aortic stenosis, mild-moderate by echo 2013     Discharged Condition: stable  Hospital Course:  PRYNCE JACOBER is a 80 y.o. male with a history of chronic PVC's and PACs, PAF, Barretts esophagus, colon cancer, HTN, DM, HLD and chronic systolic heart failure, mod AS, CAD s/p DES to mLAD 08/05/10, last Cath 08/2013 with patent stents after recent NSTEMI, p/w chest pain x 1 day. He states his chest pain felt like a tightness similar to his prior MIs. He developed chest tightness around 430pm while at the pharmacy. Afterwards he had difficulty walking to his car, felt like he was not going to make it. He got home in 47mins and rested with no relief. He then took ASA and NTG x 3 with mild relief. Pain went from 7-8/10 down to 3-4/10. He then called EMS after talking with his family. He was given more NTG en route by EMS. He is now chest pain free. He denied any other associated symptoms. He no acute changes on ecg and troponin was negative.  He was admitted for observation and ruled out for MI.  Ranexa was added.  Samples were arranged at the office.  He ambulated in the hall without difficulties and felt well.  The patient was seen by Dr. Sallyanne Kuster who felt he was stable for DC home.  Follow up arranged   Consults: None  Significant Diagnostic Studies:  Cardiac Panel (last 3 results)  Recent Labs  08/28/13 0323 08/28/13 0838  TROPONINI <0.30 <0.30   PORTABLE CHEST - 1 VIEW  COMPARISON: DG CHEST 1V PORT dated  08/14/2013; DG CHEST 2 VIEW dated 12/13/2012  FINDINGS: Diffuse interstitial prominence is stable. Mild cardiomegaly is stable. No pneumothorax. No pleural effusion.  IMPRESSION: Diffuse interstitial prominence. Stable.    Treatments: See above  Discharge Exam: Blood pressure 147/55, pulse 68, temperature 97.8 F (36.6 C), temperature source Oral, resp. rate 18, height 5\' 9"  (1.753 m), weight 160 lb 12.8 oz (72.938 kg), SpO2 99.00%.   Disposition: 01-Home or Self Care      Discharge Orders   Future Appointments Provider Department Dept Phone   09/18/2013 8:30 AM Burtis Junes, NP [facility] [phone]   Future Orders Complete By Expires   Diet - low sodium heart healthy  As directed    Increase activity slowly  As directed        Medication List         aspirin EC 81 MG tablet  Take 81 mg by mouth daily.     atorvastatin 40 MG tablet  Commonly known as:  LIPITOR  Take 40 mg by mouth every morning.     carvedilol 25 MG tablet  Commonly known as:  COREG  Take 1 tablet (25 mg total) by mouth 2 (two) times daily with a meal.     glimepiride 4 MG tablet  Commonly known as:  AMARYL  Take 4 mg by mouth daily after breakfast.     isosorbide mononitrate 60 MG 24  hr tablet  Commonly known as:  IMDUR  Take 1.5 tablets (90 mg total) by mouth 2 (two) times daily.     linagliptin 5 MG Tabs tablet  Commonly known as:  TRADJENTA  Take 5 mg by mouth daily as needed. For blood sugar     lisinopril 5 MG tablet  Commonly known as:  PRINIVIL,ZESTRIL  Take 7.5 mg by mouth every morning.     nitroGLYCERIN 0.4 MG SL tablet  Commonly known as:  NITROSTAT  Place 1 tablet (0.4 mg total) under the tongue every 5 (five) minutes as needed. For chest pain     omeprazole 20 MG capsule  Commonly known as:  PRILOSEC  Take 20 mg by mouth daily.     PRESERVISION/LUTEIN Caps  Take 1 capsule by mouth daily.     RA MELATONIN 10 MG Tabs  Generic drug:  Melatonin    Take 5 mg by mouth at bedtime.     ranolazine 500 MG 12 hr tablet  Commonly known as:  RANEXA  Take 1 tablet (500 mg total) by mouth 2 (two) times daily.     SYSTANE OP  Place 1 drop into both eyes as directed. 2-4 times a day     VITAMIN D-3 PO  Take 1 tablet by mouth daily.     warfarin 4 MG tablet  Commonly known as:  COUMADIN  Take 4 mg by mouth daily.       Follow-up Information   Follow up with Truitt Merle, NP On 09/18/2013. (8:30 Am)    Specialty:  Nurse Practitioner   Contact information:   [address] [place] [address] [phone]       Signed: Tarri Fuller 08/28/2013, 12:02 PM

## 2013-08-28 NOTE — Discharge Instructions (Signed)
Ranexa samples are at the [address] office.  Just ask at the front desk.

## 2013-08-28 NOTE — Progress Notes (Signed)
Patient Name: [name] Date of Encounter: 08/28/2013  Active Problems:   Chest pain with moderate risk for cardiac etiology   Length of Stay: 1  SUBJECTIVE  Feels great. No pain since admission.  CURRENT MEDS . aspirin EC  81 mg Oral Daily  . atorvastatin  40 mg Oral q morning - 10a  . carvedilol  25 mg Oral BID WC  . insulin aspart  0-5 Units Subcutaneous QHS  . insulin aspart  0-9 Units Subcutaneous Q6H  . isosorbide mononitrate  90 mg Oral BID  . lisinopril  7.5 mg Oral q morning - 10a  . multivitamin  1 tablet Oral Daily  . pantoprazole  40 mg Oral Daily  . warfarin  4 mg Oral QPC supper  . Warfarin - Physician Dosing Inpatient   Does not apply q1800    OBJECTIVE   Intake/Output Summary (Last 24 hours) at 08/28/13 1033 Last data filed at 08/28/13 0800  Gross per 24 hour  Intake      0 ml  Output    600 ml  Net   -600 ml   Filed Weights   08/28/13 0224  Weight: 160 lb 12.8 oz (72.938 kg)    PHYSICAL EXAM Filed Vitals:   08/28/13 0103 08/28/13 0159 08/28/13 0224 08/28/13 0636  BP: 175/78 167/77 181/70 147/55  Pulse: 65 50 60 68  Temp:   97.8 F (36.6 C)   TempSrc:   Oral   Resp: 18 18 18    Height:   5\' 9"  (1.753 m)   Weight:   160 lb 12.8 oz (72.938 kg)   SpO2: 96% 93% 99%    General: Alert, oriented x3, no distress Head: no evidence of trauma, PERRL, EOMI, no exophtalmos or lid lag, no myxedema, no xanthelasma; normal ears, nose and oropharynx Neck: normal jugular venous pulsations and no hepatojugular reflux; brisk carotid pulses without delay and no carotid bruits Chest: clear to auscultation, no signs of consolidation by percussion or palpation, normal fremitus, symmetrical and full respiratory excursions Cardiovascular: normal position and quality of the apical impulse, irregular rhythm, normal first and second heart sounds, no rubs or gallops, 1/6 systolic murmur Abdomen: no tenderness or distention, no masses by palpation, no abnormal  pulsatility or arterial bruits, normal bowel sounds, no hepatosplenomegaly Extremities: no clubbing, cyanosis or edema; 2+ radial, ulnar and brachial pulses bilaterally; 2+ right femoral, posterior tibial and dorsalis pedis pulses; 2+ left femoral, posterior tibial and dorsalis pedis pulses; no subclavian or femoral bruits Neurological: grossly nonfocal  LABS  CBC  Recent Labs  08/27/13 2307 08/28/13 0838  WBC 4.9 5.2  NEUTROABS 3.1  --   HGB 13.0 12.5*  HCT 37.1* 36.5*  MCV 100.8* 99.7  PLT 156 161*   Basic Metabolic Panel  Recent Labs  08/27/13 2307 08/28/13 0323  NA 144 142  K 3.9 3.5*  CL 109 107  CO2 23 23  GLUCOSE 89 93  BUN 21 18  CREATININE 0.92 0.90  CALCIUM 9.3 9.3  MG  --  2.0   Liver Function Tests No results found for this basename: AST, ALT, ALKPHOS, BILITOT, PROT, ALBUMIN,  in the last 72 hours No results found for this basename: LIPASE, AMYLASE,  in the last 72 hours Cardiac Enzymes  Recent Labs  08/28/13 0323 08/28/13 0838  TROPONINI <0.30 <0.30   Radiology Studies Dg Chest Portable 1 View  08/27/2013   CLINICAL DATA:  Chest pain  EXAM: PORTABLE CHEST - 1 VIEW  COMPARISON:  DG CHEST 1V PORT dated 08/14/2013; DG CHEST 2 VIEW dated 12/13/2012  FINDINGS: Diffuse interstitial prominence is stable. Mild cardiomegaly is stable. No pneumothorax. No pleural effusion.  IMPRESSION: Diffuse interstitial prominence.  Stable.   Electronically Signed   By: Maryclare Bean M.D.   On: 08/27/2013 23:33    TELE NSR, PVCs  ECG NSR, PVCs, No ST-T changes  ASSESSMENT AND PLAN Asymptomatic at rest.  Low risk ECG and labs. Cath  Was just done 2 weeks ago. Already on high dose beta blocker and long acting nitrates. Ambulate in hall and DC if he feels well. Empirical Ranexa 500 mg BID, increase to 1000 mg BID in 1 week if he tolerates it well. F/U with Dr. [name].   Sanda Klein, MD, [facility] [phone] office [phone] pager 08/28/2013 10:33  AM

## 2013-08-28 NOTE — ED Notes (Signed)
Pt is upset, pt requesting to go home due to being here so long. Explained to pt he was waiting on a bed.

## 2013-08-28 NOTE — Telephone Encounter (Signed)
[name] called to get samples of ranexa 500 mg for d/c patient

## 2013-08-31 ENCOUNTER — Encounter: Payer: Medicare Other | Admitting: Nurse Practitioner

## 2013-09-05 ENCOUNTER — Other Ambulatory Visit: Payer: Self-pay | Admitting: Cardiovascular Disease

## 2013-09-18 ENCOUNTER — Ambulatory Visit (INDEPENDENT_AMBULATORY_CARE_PROVIDER_SITE_OTHER): Payer: Medicare Other | Admitting: Nurse Practitioner

## 2013-09-18 ENCOUNTER — Encounter: Payer: Self-pay | Admitting: Nurse Practitioner

## 2013-09-18 VITALS — BP 130/70 | HR 64 | Ht 69.0 in | Wt 161.1 lb

## 2013-09-18 DIAGNOSIS — I214 Non-ST elevation (NSTEMI) myocardial infarction: Secondary | ICD-10-CM

## 2013-09-18 DIAGNOSIS — Z79899 Other long term (current) drug therapy: Secondary | ICD-10-CM

## 2013-09-18 DIAGNOSIS — I2510 Atherosclerotic heart disease of native coronary artery without angina pectoris: Secondary | ICD-10-CM

## 2013-09-18 DIAGNOSIS — I5022 Chronic systolic (congestive) heart failure: Secondary | ICD-10-CM

## 2013-09-18 DIAGNOSIS — I10 Essential (primary) hypertension: Secondary | ICD-10-CM

## 2013-09-18 DIAGNOSIS — Z9889 Other specified postprocedural states: Secondary | ICD-10-CM

## 2013-09-18 NOTE — Progress Notes (Signed)
[name] Date of Birth: 12/09/1923 Medical Record #[id number]  History of Present Illness: [name] is seen back today for a post hospital visit. Seen for Dr. [name]. 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 He has paroxysmal atrial fib - managed with rate control and anticoagulation with past failed cardioversion due to inability to pass the probe into his esophagus.   Most recently admitted twice last month - with chest pain. Was cathed during the first admission - stent ok - continued on medical management. Second admission without invasive testing - managed medically.   Comes back today. Here alone. Doing ok. No chest pain. Feels ok. Notes that the Ranexa is $175 per month - he is wanting to know if this is necessary. No NTG use. Not dizzy or lightheaded. Tolerating his medicines.    Current Outpatient Prescriptions  Medication Sig Dispense Refill  . aspirin EC 81 MG tablet Take 81 mg by mouth daily.      [name] atorvastatin (LIPITOR) 40 MG tablet TAKE ONE TABLET BY MOUTH ONCE DAILY  30 tablet  1  . carvedilol (COREG) 25 MG tablet Take 1 tablet (25 mg total) by mouth 2 (two) times daily with a meal.  60 tablet  6  . Cholecalciferol (VITAMIN D-3 PO) Take 1 tablet by mouth daily.       [name] glimepiride (AMARYL) 4 MG tablet Take 4 mg by mouth daily after breakfast.       . isosorbide mononitrate (IMDUR) 60 MG 24 hr tablet Take 1.5 tablets (90 mg total) by mouth 2 (two) times daily.  90 tablet  6  . linagliptin (TRADJENTA) 5 MG TABS tablet Take 5 mg  by mouth daily as needed. For blood sugar      . lisinopril (PRINIVIL,ZESTRIL) 5 MG tablet Take 7.5 mg by mouth every morning.      . Melatonin (RA MELATONIN) 10 MG TABS Take 5 mg by mouth at bedtime.      . Multiple Vitamins-Minerals (PRESERVISION/LUTEIN) CAPS Take 1 capsule by mouth daily.     0  . nitroGLYCERIN (NITROSTAT) 0.4 MG SL tablet Place 1 tablet (0.4 mg total) under the tongue every 5 (five) minutes as needed. For chest pain  25 tablet  3  . omeprazole (PRILOSEC) 20 MG capsule Take 20 mg by mouth daily.      [name] Glycol-Propyl Glycol (SYSTANE OP) Place 1 drop into both eyes as directed. 2-4 times a day      . ranolazine (RANEXA) 500 MG 12 hr tablet Take 1 tablet (500 mg total) by mouth 2 (two) times daily.  60 tablet  5  . warfarin (COUMADIN) 4 MG tablet Take 4 mg by mouth daily.       No current facility-administered medications for this visit.    Allergies  Allergen Reactions  . Metformin And Related Nausea And Vomiting    Past Medical History  Diagnosis Date  . Coronary artery disease     a. s/p PCI w/ DES to mLAD 08/05/10. b. NSTEMI 08/2013 (  mildly elev trop) - cath showing widely patent stent, 80% prox D1 (small vessel) with recommendation for medical management, residual 60% ostial PDA, <20% LCx, LVEF 55-65%.   . Hypertension   . Hyperlipidemia   . Barrett esophagus   . Gallstone pancreatitis 2011    a. s/p chole.  . SBO (small bowel obstruction) 2005  . Macrocytic anemia 06/25/2011  . PAF (paroxysmal atrial fibrillation)     a. failed TEE due to inability to pass probe into the esophagus in March 2013. b. On Coumadin. Was in NSR 08/2013 admission.  . Chronic anticoagulation   . Depression   . GERD (gastroesophageal reflux disease)   . Colon cancer     a. 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 b.  "graduated" from their practice 06/2013, remains free of any new disease per those notes.  . Type II diabetes mellitus   . Premature atrial contractions    . Premature ventricular contractions   . Aortic stenosis     a. Mild-mod by echo 2013.  [name] Chronic systolic CHF (congestive heart failure)     a. EF previously 35-40%. b. improved to 55-65% by cath 08/2013.  . Myelodysplastic disease     a. Suspected low-grade  myelodysplastic disease per onc notes.    Past Surgical History  Procedure Laterality Date  . Cholecystectomy  2011  . Right colectomy    . Coronary angioplasty with stent placement  08/05/10    DES to the LAD  . Cardiac catheterization  08/03/10  . Cardiac catheterization  08/09/10    LAD 30, stent OK, CFX 40, RCA < 20  . Abdominal mass resection      Mass near mesentery  . Cystoscopy      "with removal of kidney stone in office"  . Cataract extraction Bilateral   . Appendectomy      "took out during colon surgery"  . Cystoscopy with retrograde pyelogram, ureteroscopy and stent placement Bilateral 05/18/2013    Procedure: CYSTOSCOPY WITH BILATERAL RETROGRADE PYELOGRAM, LEFT URETEROSCOPY AND LEFT STENT PLACEMENT;  Surgeon: Alexis Frock, MD;  Location: [facility];  Service: Urology;  Laterality: Bilateral;  . Colon surgery    . Tonsillectomy      History  Smoking status  . Former Smoker -- 1.00 packs/day for 30 years  . Types: Cigarettes  . Quit date: 05/10/1994  Smokeless tobacco  . Never Used    Comment: smoked off and on    History  Alcohol Use  . 1.2 oz/week  . 2 Shots of liquor per week    Comment: 08/14/2013 "shot of scotch maybe twice/wk"    Family History  Problem Relation Age of Onset  . Cancer Mother   . Ulcers Father     Review of Systems: The review of systems is per the HPI.  All other systems were reviewed and are negative.  Physical Exam: BP 130/70  Pulse 64  Ht 5\' 9"  (1.753 m)  Wt 161 lb 1.9 oz (73.084 kg)  BMI 23.78 kg/m2  SpO2 97% Patient is very pleasant and in no acute distress. Looks younger than his stated age. Skin is warm and dry. Some bruising on his arms. Color is normal.  HEENT  is unremarkable. Normocephalic/atraumatic. PERRL. Sclera are nonicteric. Neck is supple. No masses. No JVD. Lungs are clear. Cardiac exam shows an irregular rhythm. Rate ok. Abdomen is soft. Extremities are without edema. Gait and ROM are intact. No gross neurologic deficits noted.  LABORATORY DATA: Lab  Results  Component Value Date   WBC 5.2 08/28/2013   HGB 12.5* 08/28/2013   HCT 36.5* 08/28/2013   PLT 138* 08/28/2013   GLUCOSE 93 08/28/2013   CHOL 119 08/07/2013   TRIG 50.0 08/07/2013   HDL 39.50 08/07/2013   LDLCALC 70 08/07/2013   ALT 22 08/07/2013   AST 24 08/07/2013   NA 142 08/28/2013   K 3.5* 08/28/2013   CL 107 08/28/2013   CREATININE 0.90 08/28/2013   BUN 18 08/28/2013   CO2 23 08/28/2013   TSH 1.704 07/17/2011   PSA 4.73* 12/16/2009   INR 2.11* 08/28/2013   HGBA1C 7.4* 08/15/2013   Cardiac Catheterization Procedure Note  Name: AERION BAGDASARIAN  MRN: [id number]  DOB: 07/01/1923  Procedure: Left Heart Cath, Selective Coronary Angiography, LV angiography  Indication: 80 yo WM with history of DES of the mid LAD in 2012 present with a NSTEMI.  Procedural details: Prior study in 2012 from the right radial approach revealed a radial loop superior to the elbow. We therefore used a femoral approach. The right groin was prepped, draped, and anesthetized with 1% lidocaine. Using modified Seldinger technique, a 5 French sheath was introduced into the right femoral artery. Standard Judkins catheters were used for coronary angiography and left ventriculography. Catheter exchanges were performed over a guidewire. There were no immediate procedural complications. The patient was transferred to the post catheterization recovery area for further monitoring.  Procedural Findings:  Hemodynamics:  AO 107/52 mean 75 mm Hg  LV 106/6 mm Hg  Coronary angiography:  Coronary dominance: right  Left mainstem: Normal.  Left anterior descending (LAD): The stent in the LAD is widely patent. There is 30% disease in the  mid and distal vessel. The first diagonal is small and has a 80% stenosis proximally.  Left circumflex (LCx): Mild disease less than 20%.  Right coronary artery (RCA): Very large dominant vessel with mild irregularities. There is focal 60% stenosis in the ostium of the PDA.  Left ventriculography: Left ventricular systolic function is normal, LVEF is estimated at 55-65%, there is no significant mitral regurgitation  Final Conclusions:  1. Single vessel obstructive CAD involving a small diagonal branch. The LAD stent is patent.  2. Normal LV function.  Recommendations: Continue medical therapy. Will resume coumadin per pharmacy.  Ander Slade [facility]  08/15/2013, 12:07 PM    Assessment / Plan: 1. CAD - recent NSTEMI in early April 2015 - s/p cath - stent patent - has some residual disease - to manage medically - now on Ranexa - doing well clinically but this may be cost prohibitive. Will try to do the forms to help offset the cost.   2. HTN - BP ok on current regimen.   3. AS - no cardinal symptoms. Last echo from 2013. Consider repeating on return visit or if continues to have problems.  4. Chronic systolic HF - EF has improved by his cath - he looks good clinically.   Patient is agreeable to this plan and will call if any problems develop in the interim.   Burtis Junes, RN, [place] [address] [place], [place]  [address] [phone]

## 2013-09-18 NOTE — Patient Instructions (Addendum)
Stay on your current medicines  We will try to get you samples of Ranexa - will also get you the forms to complete to get some $ assistance  See Dr. [name] in 3 months  Call the [place] office at [phone] if you have any questions, problems or concerns.

## 2013-09-25 ENCOUNTER — Other Ambulatory Visit: Payer: Self-pay | Admitting: Cardiovascular Disease

## 2013-10-18 NOTE — Progress Notes (Signed)
Patient ID: [name], male   DOB: 08-09-1923, 80 y.o.   MRN: [id number] Ranexa was placed up front on 08/28/13 for the patient and return to closet 10/18/13

## 2013-10-22 ENCOUNTER — Other Ambulatory Visit: Payer: Self-pay | Admitting: Nurse Practitioner

## 2013-10-22 MED ORDER — ATORVASTATIN CALCIUM 40 MG PO TABS: 40.0000 mg | ORAL_TABLET | Freq: Every day | ORAL | Status: DC

## 2013-10-27 ENCOUNTER — Other Ambulatory Visit: Payer: Self-pay | Admitting: Cardiovascular Disease

## 2013-10-30 IMAGING — CR DG CHEST 1V PORT
1 series · 1 of 1 positions shown · non-contrast
Comparison: 07/16/2011.

CLINICAL DATA: Low oxygen saturation.

PORTABLE CHEST - 1 VIEW

[AP]
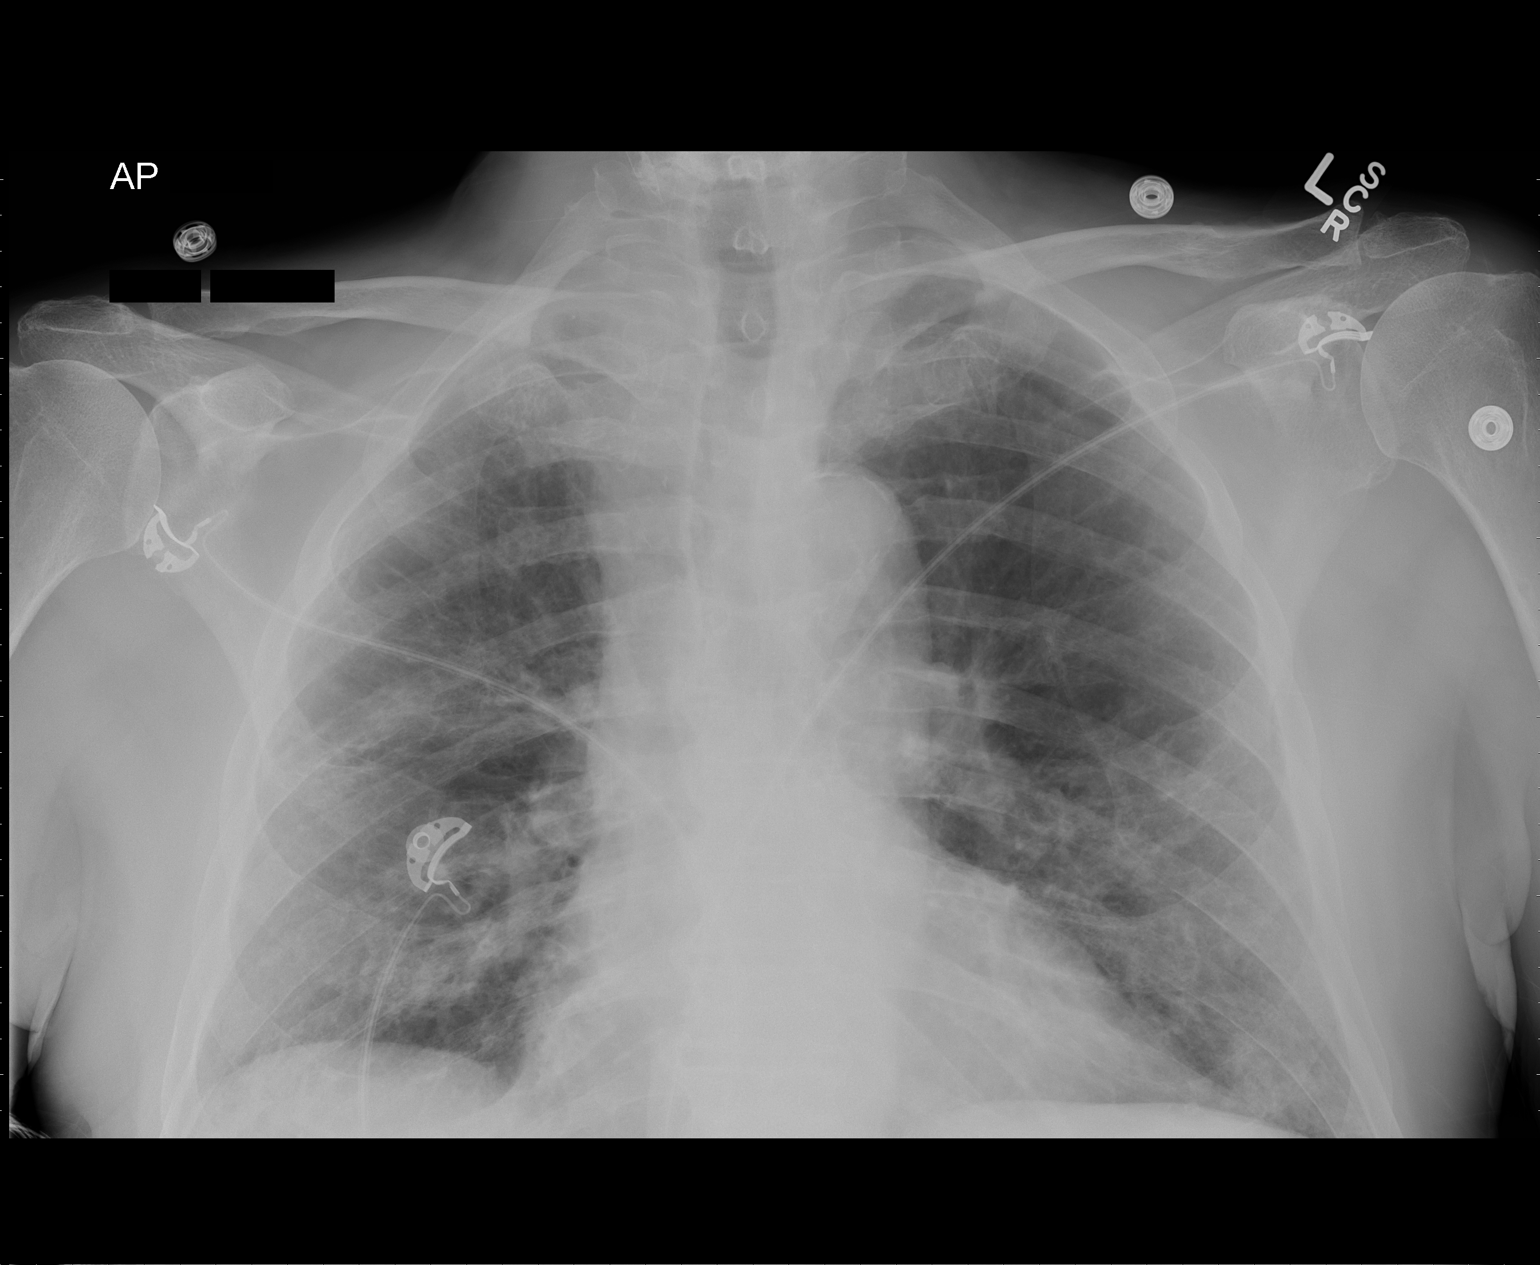

[1 of 1 positions shown; findings below may reference images not displayed]

FINDINGS: The cardiac silhouette, mediastinal and hilar contours
are stable.  Stable emphysematous changes and areas of pulmonary
fibrosis.  No definite superimposed infiltrate or effusion.
IMPRESSION: Chronic emphysematous changes and areas of pulmonary fibrosis
without definite acute overlying pulmonary process.

## 2013-11-22 ENCOUNTER — Other Ambulatory Visit: Payer: Self-pay | Admitting: Cardiovascular Disease

## 2013-11-23 ENCOUNTER — Ambulatory Visit: Payer: Medicare Other | Admitting: Cardiovascular Disease

## 2013-12-24 ENCOUNTER — Encounter: Payer: Self-pay | Admitting: Cardiovascular Disease

## 2013-12-24 ENCOUNTER — Ambulatory Visit (INDEPENDENT_AMBULATORY_CARE_PROVIDER_SITE_OTHER): Payer: Medicare Other | Admitting: Cardiovascular Disease

## 2013-12-24 VITALS — BP 144/68 | HR 53 | Ht 69.0 in | Wt 165.8 lb

## 2013-12-24 DIAGNOSIS — I10 Essential (primary) hypertension: Secondary | ICD-10-CM

## 2013-12-24 DIAGNOSIS — E785 Hyperlipidemia, unspecified: Secondary | ICD-10-CM

## 2013-12-24 DIAGNOSIS — I2510 Atherosclerotic heart disease of native coronary artery without angina pectoris: Secondary | ICD-10-CM

## 2013-12-24 NOTE — Assessment & Plan Note (Signed)
Will check fasting lipids at his next office visit

## 2013-12-24 NOTE — Assessment & Plan Note (Signed)
His BP is doing ok

## 2013-12-24 NOTE — Progress Notes (Signed)
[name] Date of Birth  [birth year]       [facility]    [employer] [address], [address], [place], [place]  [address]   [place], [place]  [address] [phone]     [phone]   Fax  [phone]    Fax [phone]  Problem List: 1. CAD - s/p stenting July 2012.  2. Hypertension  3. Hyperlipidemia  4. Diabetes Mellitus    History of Present Illness:  [name] is seen today for a 2 week check. He has known CAD with prior PCI to the LAD last year. Other issues include chronic PVC's and PACs, HTN, DM, HLD and systolic heart failure. He now has atrial fib and will be managed with rate control and anticoagulation. He had a failed attempt at [place] in March  due to inability to pass the probe into the esophagus. His EF is 35 to 40% with mild to moderate AS.  His INR has been therapeutic. He really is not interested in doing cardioversion.  December 23, 2011 - he was initially seen in the emergency room. He has progressive back pain associated with increasing shortness of breath. [name] noticed that if he takes a nitroglycerin at this back pain and his shortness of breath improved. He has these discomforts with minor levels of exertion. He is also having at rest.  March 20, 2012: [name] presented with some episodes of chest pain when I last saw him in August. He had a stress Myoview study that was normal.  He still walks almost every day - he goes around [employer] 1-1 1/2 miles a day.  He had a brief episode of CP yesterday - relieved with NTG.  He had some food poisoning recently ( raw oysters)  Oct 03, 2012:  He has been having some mid abdominal pain with radiation around to the back.  He took a NTG with relief.   The pain was a dull pain and would last 30 minutes or so.    He has been putting in his garden without any without any problems.    Oct. 1, 2014:  [name] is doing ok.  He is having problems with his memory.   Has trouble getting the words out sometimes.  No CVA symptoms.   No focal neuro findings.   No  CP or dyspnea.  Still working out in his garden and with the wildlife club at [facility].    August 10, 2013:   [name] has been having some chest tightness .  Occurs 1-2 times a week.  The chest tightness is relieved if he takes a nitroglycerin.   he also crushes up a full strength aspirin and takes that.   The pains occur with rest.  Not associated with eating  Or drinking anything.   Similar to his previous epidoses of CP but are not as severe.  December 24, 2013:  [name] has been hospitalized since I last saw him. He had some chest pain and  a followup cardiac catheterization revealed that his stents were okay. [name] been on Ranexa but complains of the cost is a bit high.  The Ranexa seems to be helping quite a bit.   Current Outpatient Prescriptions on File Prior to Visit  Medication Sig Dispense Refill  . aspirin EC 81 MG tablet Take 81 mg by mouth daily.      [name] atorvastatin (LIPITOR) 40 MG tablet Take 1 tablet (40 mg  total) by mouth daily at 6 PM.  90 tablet  3  . carvedilol (COREG) 25 MG tablet Take 1 tablet (25 mg total) by mouth 2 (two) times daily with a meal.  60 tablet  6  . Cholecalciferol (VITAMIN D-3 PO) Take 1 tablet by mouth daily.       [name] glimepiride (AMARYL) 4 MG tablet Take 4 mg by mouth daily after breakfast.       . isosorbide mononitrate (IMDUR) 60 MG 24 hr tablet TAKE ONE AND ONE-HALF TABLETS BY MOUTH TWICE DAILY  90 tablet  0  . linagliptin (TRADJENTA) 5 MG TABS tablet Take 5 mg by mouth daily as needed. For blood sugar      . lisinopril (PRINIVIL,ZESTRIL) 5 MG tablet Take 7.5 mg by mouth every morning.      . Melatonin (RA MELATONIN) 10 MG TABS Take 5 mg by mouth at bedtime.      . Multiple Vitamins-Minerals (PRESERVISION/LUTEIN) CAPS Take 1 capsule by mouth daily.     0  . nitroGLYCERIN (NITROSTAT) 0.4 MG SL tablet Place 1 tablet (0.4 mg total) under the tongue every 5  (five) minutes as needed. For chest pain  25 tablet  3  . omeprazole (PRILOSEC) 20 MG capsule Take 20 mg by mouth daily.      Vladimir Faster Glycol-Propyl Glycol (SYSTANE OP) Place 1 drop into both eyes as directed. 2-4 times a day      . ranolazine (RANEXA) 500 MG 12 hr tablet Take 1 tablet (500 mg total) by mouth 2 (two) times daily.  60 tablet  5  . warfarin (COUMADIN) 4 MG tablet Take 4 mg by mouth daily.       No current facility-administered medications on file prior to visit.    Allergies  Allergen Reactions  . Metformin And Related Nausea And Vomiting    Past Medical History  Diagnosis Date  . Coronary artery disease     a. s/p PCI w/ DES to mLAD 08/05/10. b. NSTEMI 08/2013 (mildly elev trop) - cath showing widely patent stent, 80% prox D1 (small vessel) with recommendation for medical management, residual 60% ostial PDA, <20% LCx, LVEF 55-65%.   . Hypertension   . Hyperlipidemia   . Barrett esophagus   . Gallstone pancreatitis 2011    a. s/p chole.  . SBO (small bowel obstruction) 2005  . Macrocytic anemia 06/25/2011  . PAF (paroxysmal atrial fibrillation)     a. failed TEE due to inability to pass probe into the esophagus in March 2013. b. On Coumadin. Was in NSR 08/2013 admission.  . Chronic anticoagulation   . Depression   . GERD (gastroesophageal reflux disease)   . Colon cancer     a. 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 b.  "graduated" from their practice 06/2013, remains free of any new disease per those notes.  . Type II diabetes mellitus   . Premature atrial contractions   . Premature ventricular contractions   . Aortic stenosis     a. Mild-mod by echo 2013.  [name] Chronic systolic CHF (congestive heart failure)     a. EF previously 35-40%. b. improved to 55-65% by cath 08/2013.  . Myelodysplastic disease     a. Suspected low-grade  myelodysplastic disease per onc notes.    Past Surgical History  Procedure Laterality Date  . Cholecystectomy  2011  .  Right colectomy    . Coronary angioplasty with stent placement  08/05/10    DES  to the LAD  . Cardiac catheterization  08/03/10  . Cardiac catheterization  08/09/10    LAD 30, stent OK, CFX 40, RCA < 20  . Abdominal mass resection      Mass near mesentery  . Cystoscopy      "with removal of kidney stone in office"  . Cataract extraction Bilateral   . Appendectomy      "took out during colon surgery"  . Cystoscopy with retrograde pyelogram, ureteroscopy and stent placement Bilateral 05/18/2013    Procedure: CYSTOSCOPY WITH BILATERAL RETROGRADE PYELOGRAM, LEFT URETEROSCOPY AND LEFT STENT PLACEMENT;  Surgeon: Alexis Frock, MD;  Location: [facility];  Service: Urology;  Laterality: Bilateral;  . Colon surgery    . Tonsillectomy      History  Smoking status  . Former Smoker -- 1.00 packs/day for 30 years  . Types: Cigarettes  . Quit date: 05/10/1994  Smokeless tobacco  . Never Used    Comment: smoked off and on    History  Alcohol Use  . 1.2 oz/week  . 2 Shots of liquor per week    Comment: 08/14/2013 "shot of scotch maybe twice/wk"    Family History  Problem Relation Age of Onset  . Cancer Mother   . Ulcers Father     Reviw of Systems:  Reviewed in the HPI.  All other systems are negative.  Physical Exam: Blood pressure 144/68, pulse 53, height 5\' 9"  (1.753 m), weight 165 lb 12.8 oz (75.206 kg), SpO2 97.00%. The patient is alert and oriented x 3. The mood and affect are normal.  Skin: warm and dry. Color is normal.  HEENT: he has 2+carotids. He is no JVD.  Lungs: His lungs are clear to auscultation.  Heart: irregularly irregular S1-S2.  Abdomen: His abdominal exam is benign.  Extremities:  no clubbing cyanosis or edema.  Neuro: Nonfocal  Psych:  Responds to questions appropriately with a normal affect.  ECG: 08/10/2013: Normal sinus rhythm at 70. He has frequent premature ventricular contractions and also has sinus arrhythmia. Assessment / Plan:

## 2013-12-24 NOTE — Assessment & Plan Note (Signed)
[name] is doing well. He was admitted to the hospital in April with chest pain.   His cardiac cath revealed a patent stent. He has a small diagonal vessel with an 80% stenosis. He was treated medically. He was started on Ranexa and has done very well.  He is not having any chest pain. Continue current meds,.  Will see him in 6 months.

## 2013-12-24 NOTE — Patient Instructions (Signed)
Your physician recommends that you continue on your current medications as directed. Please refer to the Current Medication list given to you today.  Your physician wants you to follow-up in: 6 months with Dr Turner You will receive a reminder letter in the mail two months in advance. If you don't receive a letter, please call our office to schedule the follow-up appointment.  

## 2013-12-29 ENCOUNTER — Other Ambulatory Visit: Payer: Self-pay | Admitting: Cardiovascular Disease

## 2014-01-29 ENCOUNTER — Other Ambulatory Visit: Payer: Self-pay | Admitting: Cardiovascular Disease

## 2014-03-14 ENCOUNTER — Other Ambulatory Visit: Payer: Self-pay | Admitting: Internal Medicine

## 2014-03-14 DIAGNOSIS — R413 Other amnesia: Secondary | ICD-10-CM

## 2014-03-15 ENCOUNTER — Ambulatory Visit
Admission: RE | Admit: 2014-03-15 | Discharge: 2014-03-15 | Disposition: A | Discharge status: Home / Self Care | Payer: Medicare Other | Source: Ambulatory Visit | Attending: Internal Medicine | Admitting: Internal Medicine

## 2014-03-15 DIAGNOSIS — R413 Other amnesia: Secondary | ICD-10-CM

## 2014-03-25 ENCOUNTER — Other Ambulatory Visit: Payer: Self-pay | Admitting: Cardiovascular Disease

## 2014-03-27 ENCOUNTER — Telehealth: Payer: Self-pay | Admitting: Cardiovascular Disease

## 2014-03-27 NOTE — Telephone Encounter (Signed)
New message          C/o taking too much nitro / pt does not feel well l

## 2014-03-27 NOTE — Telephone Encounter (Signed)
Received call directly from operator.  Patient states he took a nitroglycerin earlier today and he does not feel well. Patient took 1 SL NTG about 15 minutes ago for chest pain and SOB.  Patient states he had a headache prior to taking the NTG; reports symptoms are improving while we are on the phone.  Patient reports he had just returned from shopping when symptoms occurred.  Patient wants to be seen today by Dr. [name].  I advised patient that Dr. [name] is not in the office and will not return until next week.  I discussed patient's medications with him.  Patient reports he is taking Imdur and Ranexa and these medications do help his symptoms.  I advised patient to only take SL nitroglycerin He states he would like to be seen soon by Dr. [name] and if symptoms continue or worsen he will call EMS and go to the hospital.  I scheduled patient office visit with Dr. [name] 12/1 and advised patient to call EMS with worsening symptoms or concerns.  Patient verbalized understanding and agreement.

## 2014-03-27 NOTE — Telephone Encounter (Signed)
Agree with plan as outlined by Christen Bame, RN. He should call EMS if CP worsens prior to office visit

## 2014-03-28 ENCOUNTER — Other Ambulatory Visit: Payer: Self-pay

## 2014-03-28 ENCOUNTER — Telehealth: Payer: Self-pay | Admitting: Cardiovascular Disease

## 2014-03-28 MED ORDER — RANOLAZINE ER 500 MG PO TB12: 500.0000 mg | ORAL_TABLET | Freq: Two times a day (BID) | ORAL | Status: DC

## 2014-03-28 NOTE — Telephone Encounter (Signed)
Spoke with patient to inform him that i have sent refills for Ranexa to [facility].

## 2014-03-28 NOTE — Telephone Encounter (Signed)
New message           Pt needs new prescription for renexa  / pt got a letter from [facility] stating that his prescription expired

## 2014-03-29 ENCOUNTER — Other Ambulatory Visit: Payer: Self-pay

## 2014-03-29 MED ORDER — CARVEDILOL 25 MG PO TABS: 25.0000 mg | ORAL_TABLET | Freq: Two times a day (BID) | ORAL | Status: DC

## 2014-04-09 ENCOUNTER — Ambulatory Visit (INDEPENDENT_AMBULATORY_CARE_PROVIDER_SITE_OTHER): Payer: Medicare Other | Admitting: Cardiovascular Disease

## 2014-04-09 ENCOUNTER — Encounter: Payer: Self-pay | Admitting: Cardiovascular Disease

## 2014-04-09 VITALS — BP 150/82 | HR 64 | Ht 69.0 in | Wt 163.0 lb

## 2014-04-09 DIAGNOSIS — I480 Paroxysmal atrial fibrillation: Secondary | ICD-10-CM

## 2014-04-09 DIAGNOSIS — I10 Essential (primary) hypertension: Secondary | ICD-10-CM

## 2014-04-09 DIAGNOSIS — E785 Hyperlipidemia, unspecified: Secondary | ICD-10-CM

## 2014-04-09 DIAGNOSIS — I5023 Acute on chronic systolic (congestive) heart failure: Secondary | ICD-10-CM

## 2014-04-09 DIAGNOSIS — I2510 Atherosclerotic heart disease of native coronary artery without angina pectoris: Secondary | ICD-10-CM

## 2014-04-09 MED ORDER — LISINOPRIL 10 MG PO TABS: 10.0000 mg | ORAL_TABLET | Freq: Every day | ORAL | Status: DC

## 2014-04-09 NOTE — Assessment & Plan Note (Signed)
He has had CAD and now has atypical episodes of CP .   He thinks his pain feels more like gas - is typically relieved if he belches. He had a cath last year which showed patent stents.  I've encouraged him to exercise as much as possible. Continue Imdur, atorvastatin.

## 2014-04-09 NOTE — Assessment & Plan Note (Signed)
He has a hx of PAF.  Today, he is in NSR with frequent PACs and PVCs. Continue coumadin therapy. Rate is well controlled

## 2014-04-09 NOTE — Assessment & Plan Note (Signed)
Continue atorvastatin

## 2014-04-09 NOTE — Assessment & Plan Note (Addendum)
Will increase Lisinopril to 10 mg a day. Reminded him to avoid salt. Will see him in 3 months.  Continue coreg.

## 2014-04-09 NOTE — Progress Notes (Signed)
[name] Date of Birth  [birth year]       [facility]    [employer] [address], [address], [place], [place]  [address]   [place], [place]  [address] [phone]     [phone]   Fax  [phone]    Fax [phone]  Problem List: 1. CAD - s/p stenting July 2012.  2. Hypertension  3. Hyperlipidemia  4. Diabetes Mellitus      History of Present Illness:  [name] is seen today for a 2 week check. He has known CAD with prior PCI to the LAD last year. Other issues include chronic PVC's and PACs, HTN, DM, HLD and systolic heart failure. He now has atrial fib and will be managed with rate control and anticoagulation. He had a failed attempt at [place] in March  due to inability to pass the probe into the esophagus. His EF is 35 to 40% with mild to moderate AS.  His INR has been therapeutic. He really is not interested in doing cardioversion.  December 23, 2011 - he was initially seen in the emergency room. He has progressive back pain associated with increasing shortness of breath. [name] noticed that if he takes a nitroglycerin at this back pain and his shortness of breath improved. He has these discomforts with minor levels of exertion. He is also having at rest.  March 20, 2012: [name] presented with some episodes of chest pain when I last saw him in August. He had a stress Myoview study that was normal.  He still walks almost every day - he goes around [employer] 1-1 1/2 miles a day.  He had a brief episode of CP yesterday - relieved with NTG.  He had some food poisoning recently ( raw oysters)  Oct 03, 2012:  He has been having some mid abdominal pain with radiation around to the back.  He took a NTG with relief.   The pain was a dull pain and would last 30 minutes or so.    He has been putting in his garden without any without any problems.    Oct. 1, 2014:  [name] is doing ok.  He is having problems with his memory.   Has trouble getting the words out sometimes.  No CVA symptoms.   No focal neuro findings.   No  CP or dyspnea.  Still working out in his garden and with the wildlife club at [facility].    August 10, 2013:   [name] has been having some chest tightness .  Occurs 1-2 times a week.  The chest tightness is relieved if he takes a nitroglycerin.   he also crushes up a full strength aspirin and takes that.   The pains occur with rest.  Not associated with eating  Or drinking anything.   Similar to his previous epidoses of CP but are not as severe.  December 24, 2013:  [name] has been hospitalized since I last saw him. He had some chest pain and  a followup cardiac catheterization revealed that his stents were okay. [name] been on Ranexa but complains of the cost is a bit high.  The Ranexa seems to be helping quite a bit.   Dec. 1, 2015: [name] is an 80 yo with hx of CAD and a-fib.  He has been having lots of problems with gas.  No further episodes of angina.   He is not as active.   BP  at home is little elevated.    Current Outpatient Prescriptions on File Prior to Visit  Medication Sig Dispense Refill  . aspirin EC 81 MG tablet Take 81 mg by mouth daily.    [name] atorvastatin (LIPITOR) 40 MG tablet Take 1 tablet (40 mg total) by mouth daily at 6 PM. 90 tablet 3  . carvedilol (COREG) 25 MG tablet Take 1 tablet (25 mg total) by mouth 2 (two) times daily with a meal. 60 tablet 6  . Cholecalciferol (VITAMIN D-3 PO) Take 1 tablet by mouth daily.     [name] glimepiride (AMARYL) 4 MG tablet Take 4 mg by mouth daily after breakfast.     . isosorbide mononitrate (IMDUR) 60 MG 24 hr tablet TAKE ONE AND ONE-HALF TABLETS BY MOUTH TWICE DAILY 90 tablet 3  . linagliptin (TRADJENTA) 5 MG TABS tablet Take 5 mg by mouth daily as needed. For blood sugar    . lisinopril (PRINIVIL,ZESTRIL) 5 MG tablet Take 7.5 mg by mouth every morning.    . Melatonin (RA MELATONIN) 10 MG TABS Take 5 mg by mouth at bedtime.    . Multiple  Vitamins-Minerals (PRESERVISION/LUTEIN) CAPS Take 1 capsule by mouth daily.   0  . NITROSTAT 0.4 MG SL tablet PLACE 1 TABLET UNDER THE TONGUE EVERY 5 MINUTES AS NEEDED FOR CHEST PAIN 25 tablet 5  . omeprazole (PRILOSEC) 20 MG capsule Take 20 mg by mouth daily.    Vladimir Faster Glycol-Propyl Glycol (SYSTANE OP) Place 1 drop into both eyes as directed. 2-4 times a day    . ranolazine (RANEXA) 500 MG 12 hr tablet Take 1 tablet (500 mg total) by mouth 2 (two) times daily. 60 tablet 5  . warfarin (COUMADIN) 4 MG tablet Take 4 mg by mouth daily.     No current facility-administered medications on file prior to visit.    Allergies  Allergen Reactions  . Metformin And Related Nausea And Vomiting    Past Medical History  Diagnosis Date  . Coronary artery disease     a. s/p PCI w/ DES to mLAD 08/05/10. b. NSTEMI 08/2013 (mildly elev trop) - cath showing widely patent stent, 80% prox D1 (small vessel) with recommendation for medical management, residual 60% ostial PDA, <20% LCx, LVEF 55-65%.   . Hypertension   . Hyperlipidemia   . Barrett esophagus   . Gallstone pancreatitis 2011    a. s/p chole.  . SBO (small bowel obstruction) 2005  . Macrocytic anemia 06/25/2011  . PAF (paroxysmal atrial fibrillation)     a. failed TEE due to inability to pass probe into the esophagus in March 2013. b. On Coumadin. Was in NSR 08/2013 admission.  . Chronic anticoagulation   . Depression   . GERD (gastroesophageal reflux disease)   . Colon cancer     a. 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 b.  "graduated" from their practice 06/2013, remains free of any new disease per those notes.  . Type II diabetes mellitus   . Premature atrial contractions   . Premature ventricular contractions   . Aortic stenosis     a. Mild-mod by echo 2013.  [name] Chronic systolic CHF (congestive heart failure)     a. EF previously 35-40%. b. improved to 55-65% by cath 08/2013.  . Myelodysplastic disease     a. Suspected  low-grade  myelodysplastic disease per onc notes.    Past Surgical History  Procedure Laterality Date  . Cholecystectomy  2011  . Right colectomy    .  Coronary angioplasty with stent placement  08/05/10    DES to the LAD  . Cardiac catheterization  08/03/10  . Cardiac catheterization  08/09/10    LAD 30, stent OK, CFX 40, RCA < 20  . Abdominal mass resection      Mass near mesentery  . Cystoscopy      "with removal of kidney stone in office"  . Cataract extraction Bilateral   . Appendectomy      "took out during colon surgery"  . Cystoscopy with retrograde pyelogram, ureteroscopy and stent placement Bilateral 05/18/2013    Procedure: CYSTOSCOPY WITH BILATERAL RETROGRADE PYELOGRAM, LEFT URETEROSCOPY AND LEFT STENT PLACEMENT;  Surgeon: Alexis Frock, MD;  Location: [facility];  Service: Urology;  Laterality: Bilateral;  . Colon surgery    . Tonsillectomy      History  Smoking status  . Former Smoker -- 1.00 packs/day for 30 years  . Types: Cigarettes  . Quit date: 05/10/1994  Smokeless tobacco  . Never Used    Comment: smoked off and on    History  Alcohol Use  . 1.2 oz/week  . 2 Shots of liquor per week    Comment: 08/14/2013 "shot of scotch maybe twice/wk"    Family History  Problem Relation Age of Onset  . Cancer Mother   . Ulcers Father   . Heart attack Brother     Reviw of Systems:  Reviewed in the HPI.  All other systems are negative.  Physical Exam: Blood pressure 150/82, pulse 64, height 5\' 9"  (1.753 m), weight 163 lb (73.936 kg). The patient is alert and oriented x 3. The mood and affect are normal.  Skin: warm and dry. Color is normal.  HEENT: he has 2+carotids. He is no JVD.  Lungs: His lungs are clear to auscultation.  Heart: irregularly irregular S1-S2.  Abdomen: His abdominal exam is benign.  Extremities:  no clubbing cyanosis or edema.  Neuro: Nonfocal  Psych:  Responds to questions appropriately with a normal affect.  ECG: Dec. 1, 2015:  NSR at 41  with frequent PACs and occasional PVCs. RBBB.  Assessment / Plan:

## 2014-04-09 NOTE — Patient Instructions (Addendum)
Your physician has recommended you make the following change in your medication:  INCREASE Lisinopril to 10 mg daily  Your physician recommends that you schedule a follow-up appointment in: 3 months with Dr. [name].  Your physician recommends that you return for lab work in: on the same day as your follow-up visit with Dr. [name] ([facility])

## 2014-04-18 ENCOUNTER — Encounter (HOSPITAL_COMMUNITY): Payer: Self-pay | Admitting: Cardiology

## 2014-05-09 ENCOUNTER — Telehealth: Payer: Self-pay | Admitting: Cardiovascular Disease

## 2014-05-09 NOTE — Telephone Encounter (Signed)
Received request from Nurse fax box, documents faxed for surgical clearance. To: [facility] Fax [phone]  Attention: [phone]

## 2014-05-22 ENCOUNTER — Other Ambulatory Visit: Payer: Self-pay | Admitting: Gastroenterology

## 2014-05-23 ENCOUNTER — Encounter (HOSPITAL_COMMUNITY): Payer: Self-pay | Admitting: Urology

## 2014-06-06 ENCOUNTER — Other Ambulatory Visit: Payer: Self-pay | Admitting: Cardiovascular Disease

## 2014-06-07 ENCOUNTER — Observation Stay (HOSPITAL_COMMUNITY)
Admission: EM | Admit: 2014-06-07 | Discharge: 2014-06-08 | Disposition: A | Discharge status: Home / Self Care | Payer: Medicare Other | Attending: Internal Medicine | Admitting: Internal Medicine

## 2014-06-07 ENCOUNTER — Emergency Department (HOSPITAL_COMMUNITY): Payer: Medicare Other

## 2014-06-07 ENCOUNTER — Encounter (HOSPITAL_COMMUNITY): Payer: Self-pay

## 2014-06-07 DIAGNOSIS — I5020 Unspecified systolic (congestive) heart failure: Secondary | ICD-10-CM | POA: Diagnosis present

## 2014-06-07 DIAGNOSIS — I951 Orthostatic hypotension: Secondary | ICD-10-CM | POA: Insufficient documentation

## 2014-06-07 DIAGNOSIS — E119 Type 2 diabetes mellitus without complications: Secondary | ICD-10-CM | POA: Insufficient documentation

## 2014-06-07 DIAGNOSIS — E785 Hyperlipidemia, unspecified: Secondary | ICD-10-CM | POA: Diagnosis not present

## 2014-06-07 DIAGNOSIS — D469 Myelodysplastic syndrome, unspecified: Secondary | ICD-10-CM | POA: Diagnosis not present

## 2014-06-07 DIAGNOSIS — Z7901 Long term (current) use of anticoagulants: Secondary | ICD-10-CM | POA: Diagnosis not present

## 2014-06-07 DIAGNOSIS — I491 Atrial premature depolarization: Secondary | ICD-10-CM | POA: Insufficient documentation

## 2014-06-07 DIAGNOSIS — F329 Major depressive disorder, single episode, unspecified: Secondary | ICD-10-CM | POA: Diagnosis not present

## 2014-06-07 DIAGNOSIS — K219 Gastro-esophageal reflux disease without esophagitis: Secondary | ICD-10-CM | POA: Diagnosis not present

## 2014-06-07 DIAGNOSIS — R531 Weakness: Secondary | ICD-10-CM | POA: Diagnosis not present

## 2014-06-07 DIAGNOSIS — Z794 Long term (current) use of insulin: Secondary | ICD-10-CM | POA: Insufficient documentation

## 2014-06-07 DIAGNOSIS — R0789 Other chest pain: Principal | ICD-10-CM | POA: Insufficient documentation

## 2014-06-07 DIAGNOSIS — Z85038 Personal history of other malignant neoplasm of large intestine: Secondary | ICD-10-CM | POA: Diagnosis not present

## 2014-06-07 DIAGNOSIS — Z87891 Personal history of nicotine dependence: Secondary | ICD-10-CM | POA: Insufficient documentation

## 2014-06-07 DIAGNOSIS — Z888 Allergy status to other drugs, medicaments and biological substances status: Secondary | ICD-10-CM | POA: Diagnosis not present

## 2014-06-07 DIAGNOSIS — I350 Nonrheumatic aortic (valve) stenosis: Secondary | ICD-10-CM | POA: Diagnosis not present

## 2014-06-07 DIAGNOSIS — I5022 Chronic systolic (congestive) heart failure: Secondary | ICD-10-CM | POA: Insufficient documentation

## 2014-06-07 DIAGNOSIS — I4891 Unspecified atrial fibrillation: Secondary | ICD-10-CM | POA: Insufficient documentation

## 2014-06-07 DIAGNOSIS — I480 Paroxysmal atrial fibrillation: Secondary | ICD-10-CM | POA: Diagnosis present

## 2014-06-07 DIAGNOSIS — I10 Essential (primary) hypertension: Secondary | ICD-10-CM | POA: Diagnosis present

## 2014-06-07 DIAGNOSIS — I252 Old myocardial infarction: Secondary | ICD-10-CM | POA: Diagnosis not present

## 2014-06-07 DIAGNOSIS — Z7982 Long term (current) use of aspirin: Secondary | ICD-10-CM | POA: Diagnosis not present

## 2014-06-07 DIAGNOSIS — Z955 Presence of coronary angioplasty implant and graft: Secondary | ICD-10-CM | POA: Insufficient documentation

## 2014-06-07 DIAGNOSIS — I2510 Atherosclerotic heart disease of native coronary artery without angina pectoris: Secondary | ICD-10-CM | POA: Diagnosis not present

## 2014-06-07 DIAGNOSIS — R079 Chest pain, unspecified: Secondary | ICD-10-CM | POA: Diagnosis present

## 2014-06-07 LAB — URINALYSIS, ROUTINE W REFLEX MICROSCOPIC
BILIRUBIN URINE: NEGATIVE
Glucose, UA: NEGATIVE mg/dL
KETONES UR: 15 mg/dL — AB
LEUKOCYTES UA: NEGATIVE
Nitrite: NEGATIVE
Protein, ur: 30 mg/dL — AB
Specific Gravity, Urine: 1.025 (ref 1.005–1.030)
Urobilinogen, UA: 0.2 mg/dL (ref 0.0–1.0)
pH: 5.5 (ref 5.0–8.0)

## 2014-06-07 LAB — URINE MICROSCOPIC-ADD ON

## 2014-06-07 LAB — HEPATIC FUNCTION PANEL
ALT: 27 U/L (ref 0–53)
AST: 35 U/L (ref 0–37)
Albumin: 3 g/dL — ABNORMAL LOW (ref 3.5–5.2)
Alkaline Phosphatase: 40 U/L (ref 39–117)
Bilirubin, Direct: <0.1 mg/dL (ref 0.0–0.5)
Total Bilirubin: 0.6 mg/dL (ref 0.3–1.2)
Total Protein: 5.9 g/dL — ABNORMAL LOW (ref 6.0–8.3)

## 2014-06-07 LAB — BASIC METABOLIC PANEL
Anion gap: 9 (ref 5–15)
BUN: 15 mg/dL (ref 6–23)
CALCIUM: 8.6 mg/dL (ref 8.4–10.5)
CHLORIDE: 104 mmol/L (ref 96–112)
CO2: 22 mmol/L (ref 19–32)
CREATININE: 1.05 mg/dL (ref 0.50–1.35)
GFR, EST AFRICAN AMERICAN: 71 mL/min — AB (ref >90)
GFR, EST NON AFRICAN AMERICAN: 61 mL/min — AB (ref >90)
GLUCOSE: 169 mg/dL — AB (ref 70–99)
POTASSIUM: 3.9 mmol/L (ref 3.5–5.1)
Sodium: 135 mmol/L (ref 135–145)

## 2014-06-07 LAB — CBC
HEMATOCRIT: 36.6 % — AB (ref 39.0–52.0)
Hemoglobin: 12.8 g/dL — ABNORMAL LOW (ref 13.0–17.0)
MCH: 34.3 pg — AB (ref 26.0–34.0)
MCHC: 35 g/dL (ref 30.0–36.0)
MCV: 98.1 fL (ref 78.0–100.0)
Platelets: 164 10*3/uL (ref 150–400)
RBC: 3.73 MIL/uL — ABNORMAL LOW (ref 4.22–5.81)
RDW: 13.3 % (ref 11.5–15.5)
WBC: 3.7 10*3/uL — ABNORMAL LOW (ref 4.0–10.5)

## 2014-06-07 LAB — PROTIME-INR
INR: 4.08 — ABNORMAL HIGH (ref 0.00–1.49)
Prothrombin Time: 39.9 seconds — ABNORMAL HIGH (ref 11.6–15.2)

## 2014-06-07 LAB — TROPONIN I: Troponin I: 0.04 ng/mL — ABNORMAL HIGH (ref <0.031)

## 2014-06-07 LAB — I-STAT TROPONIN, ED: Troponin i, poc: 0.01 ng/mL (ref 0.00–0.08)

## 2014-06-07 MED ORDER — WARFARIN - PHYSICIAN DOSING INPATIENT: Freq: Every day | Status: DC

## 2014-06-07 MED ORDER — LORATADINE 10 MG PO TABS
10.0000 mg | ORAL_TABLET | Freq: Every day | ORAL | Status: DC | PRN
  Filled 2014-06-07: qty 1

## 2014-06-07 MED ORDER — PRESERVISION/LUTEIN PO CAPS: 1.0000 | ORAL_CAPSULE | Freq: Every day | ORAL | Status: DC

## 2014-06-07 MED ORDER — INSULIN ASPART 100 UNIT/ML ~~LOC~~ SOLN: 0.0000 [IU] | Freq: Every day | SUBCUTANEOUS | Status: DC

## 2014-06-07 MED ORDER — DONEPEZIL HCL 5 MG PO TABS
5.0000 mg | ORAL_TABLET | Freq: Every day | ORAL | Status: DC
  Administered 2014-06-08: 5 mg via ORAL
  Filled 2014-06-07 (×2): qty 1

## 2014-06-07 MED ORDER — NITROGLYCERIN 0.4 MG SL SUBL: 0.4000 mg | SUBLINGUAL_TABLET | SUBLINGUAL | Status: DC | PRN

## 2014-06-07 MED ORDER — WARFARIN SODIUM 2 MG PO TABS: 2.0000 mg | ORAL_TABLET | Freq: Every day | ORAL | Status: DC

## 2014-06-07 MED ORDER — SODIUM CHLORIDE 0.9 % IJ SOLN
3.0000 mL | Freq: Two times a day (BID) | INTRAMUSCULAR | Status: DC
  Administered 2014-06-08: 3 mL via INTRAVENOUS

## 2014-06-07 MED ORDER — LISINOPRIL 10 MG PO TABS
10.0000 mg | ORAL_TABLET | Freq: Every day | ORAL | Status: DC
  Administered 2014-06-08: 10 mg via ORAL
  Filled 2014-06-07: qty 1

## 2014-06-07 MED ORDER — ONDANSETRON HCL 4 MG/2ML IJ SOLN: 4.0000 mg | Freq: Four times a day (QID) | INTRAMUSCULAR | Status: DC | PRN

## 2014-06-07 MED ORDER — POLYVINYL ALCOHOL 1.4 % OP SOLN
1.0000 [drp] | OPHTHALMIC | Status: DC | PRN
  Filled 2014-06-07: qty 15

## 2014-06-07 MED ORDER — ALUM & MAG HYDROXIDE-SIMETH 200-200-20 MG/5ML PO SUSP: 30.0000 mL | Freq: Four times a day (QID) | ORAL | Status: DC | PRN

## 2014-06-07 MED ORDER — ATORVASTATIN CALCIUM 40 MG PO TABS
40.0000 mg | ORAL_TABLET | Freq: Every day | ORAL | Status: DC
  Filled 2014-06-07: qty 1

## 2014-06-07 MED ORDER — HYDROMORPHONE HCL 1 MG/ML IJ SOLN: 0.5000 mg | INTRAMUSCULAR | Status: DC | PRN

## 2014-06-07 MED ORDER — RANOLAZINE ER 500 MG PO TB12
500.0000 mg | ORAL_TABLET | Freq: Two times a day (BID) | ORAL | Status: DC
  Administered 2014-06-08 (×2): 500 mg via ORAL
  Filled 2014-06-07 (×4): qty 1

## 2014-06-07 MED ORDER — VITAMIN D 1000 UNITS PO TABS
3000.0000 [IU] | ORAL_TABLET | Freq: Every day | ORAL | Status: DC
  Administered 2014-06-08: 3000 [IU] via ORAL
  Filled 2014-06-07: qty 3

## 2014-06-07 MED ORDER — FINASTERIDE 5 MG PO TABS
5.0000 mg | ORAL_TABLET | Freq: Every day | ORAL | Status: DC
  Administered 2014-06-08: 5 mg via ORAL
  Filled 2014-06-07 (×2): qty 1

## 2014-06-07 MED ORDER — SODIUM CHLORIDE 0.9 % IJ SOLN: 3.0000 mL | INTRAMUSCULAR | Status: DC | PRN

## 2014-06-07 MED ORDER — ADULT MULTIVITAMIN W/MINERALS CH
1.0000 | ORAL_TABLET | Freq: Every day | ORAL | Status: DC
  Administered 2014-06-08: 1 via ORAL
  Filled 2014-06-07: qty 1

## 2014-06-07 MED ORDER — GLIMEPIRIDE 4 MG PO TABS
4.0000 mg | ORAL_TABLET | Freq: Every day | ORAL | Status: DC
  Administered 2014-06-08: 4 mg via ORAL
  Filled 2014-06-07 (×2): qty 1

## 2014-06-07 MED ORDER — ACETAMINOPHEN 325 MG PO TABS: 650.0000 mg | ORAL_TABLET | Freq: Four times a day (QID) | ORAL | Status: DC | PRN

## 2014-06-07 MED ORDER — ACETAMINOPHEN 650 MG RE SUPP: 650.0000 mg | Freq: Four times a day (QID) | RECTAL | Status: DC | PRN

## 2014-06-07 MED ORDER — ASPIRIN EC 325 MG PO TBEC
325.0000 mg | DELAYED_RELEASE_TABLET | Freq: Every day | ORAL | Status: DC
  Administered 2014-06-08: 325 mg via ORAL
  Filled 2014-06-07: qty 1

## 2014-06-07 MED ORDER — OXYCODONE HCL 5 MG PO TABS: 5.0000 mg | ORAL_TABLET | ORAL | Status: DC | PRN

## 2014-06-07 MED ORDER — POLYETHYL GLYCOL-PROPYL GLYCOL 0.4-0.3 % OP SOLN: 1.0000 [drp] | Freq: Four times a day (QID) | OPHTHALMIC | Status: DC

## 2014-06-07 MED ORDER — ONDANSETRON HCL 4 MG/2ML IJ SOLN: 4.0000 mg | Freq: Three times a day (TID) | INTRAMUSCULAR | Status: AC | PRN

## 2014-06-07 MED ORDER — PANTOPRAZOLE SODIUM 40 MG PO TBEC
40.0000 mg | DELAYED_RELEASE_TABLET | Freq: Every day | ORAL | Status: DC
  Administered 2014-06-08: 40 mg via ORAL

## 2014-06-07 MED ORDER — CARVEDILOL 25 MG PO TABS
25.0000 mg | ORAL_TABLET | Freq: Two times a day (BID) | ORAL | Status: DC
  Administered 2014-06-08: 25 mg via ORAL
  Filled 2014-06-07 (×3): qty 1

## 2014-06-07 MED ORDER — ONDANSETRON HCL 4 MG PO TABS: 4.0000 mg | ORAL_TABLET | Freq: Four times a day (QID) | ORAL | Status: DC | PRN

## 2014-06-07 MED ORDER — ISOSORBIDE MONONITRATE ER 30 MG PO TB24
90.0000 mg | ORAL_TABLET | Freq: Two times a day (BID) | ORAL | Status: DC
  Administered 2014-06-08 (×2): 90 mg via ORAL
  Filled 2014-06-07 (×3): qty 1

## 2014-06-07 MED ORDER — SODIUM CHLORIDE 0.9 % IV SOLN: 250.0000 mL | INTRAVENOUS | Status: DC | PRN

## 2014-06-07 MED ORDER — INSULIN ASPART 100 UNIT/ML ~~LOC~~ SOLN
0.0000 [IU] | Freq: Three times a day (TID) | SUBCUTANEOUS | Status: DC
  Administered 2014-06-08: 3 [IU] via SUBCUTANEOUS

## 2014-06-07 NOTE — ED Notes (Signed)
Dr. Jenkins at bedside. 

## 2014-06-07 NOTE — ED Provider Notes (Signed)
CSN: [id number]     Arrival date & time 06/07/14  1747 History   First MD Initiated Contact with Patient 06/07/14 1855     Chief Complaint  Patient presents with  . Chest Pain      HPI Pt here for cp for the past week with nausea and lack of energy. The pain on the left side of his chest and abd. No radiation of pain. In on coumadin for afib Past Medical History  Diagnosis Date  . Coronary artery disease     a. s/p PCI w/ DES to mLAD 08/05/10. b. NSTEMI 08/2013 (mildly elev trop) - cath showing widely patent stent, 80% prox D1 (small vessel) with recommendation for medical management, residual 60% ostial PDA, <20% LCx, LVEF 55-65%.   . Hypertension   . Hyperlipidemia   . Barrett esophagus   . Gallstone pancreatitis 2011    a. s/p chole.  . SBO (small bowel obstruction) 2005  . Macrocytic anemia 06/25/2011  . PAF (paroxysmal atrial fibrillation)     a. failed TEE due to inability to pass probe into the esophagus in March 2013. b. On Coumadin. Was in NSR 08/2013 admission.  . Chronic anticoagulation   . Depression   . GERD (gastroesophageal reflux disease)   . Colon cancer     a. 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 b.  "graduated" from their practice 06/2013, remains free of any new disease per those notes.  . Type II diabetes mellitus   . Premature atrial contractions   . Premature ventricular contractions   . Aortic stenosis     a. Mild-mod by echo 2013.  [name] Chronic systolic CHF (congestive heart failure)     a. EF previously 35-40%. b. improved to 55-65% by cath 08/2013.  . Myelodysplastic disease     a. Suspected low-grade  myelodysplastic disease per onc notes.   Past Surgical History  Procedure Laterality Date  . Cholecystectomy  2011  . Right colectomy    . Coronary angioplasty with stent placement  08/05/10    DES to the LAD  . Cardiac catheterization  08/03/10  . Cardiac catheterization  08/09/10    LAD 30, stent OK, CFX 40, RCA < 20  . Abdominal mass  resection      Mass near mesentery  . Cystoscopy      "with removal of kidney stone in office"  . Cataract extraction Bilateral   . Appendectomy      "took out during colon surgery"  . Cystoscopy with retrograde pyelogram, ureteroscopy and stent placement Bilateral 05/18/2013    Procedure: CYSTOSCOPY WITH BILATERAL RETROGRADE PYELOGRAM, LEFT URETEROSCOPY AND LEFT STENT PLACEMENT;  Surgeon: Alexis Frock, MD;  Location: [facility];  Service: Urology;  Laterality: Bilateral;  . Colon surgery    . Tonsillectomy    . Left heart catheterization with coronary angiogram N/A 08/15/2013    Procedure: LEFT HEART CATHETERIZATION WITH CORONARY ANGIOGRAM;  Surgeon: Peter M Martinique, MD;  Location: [facility] CATH LAB;  Service: Cardiovascular;  Laterality: N/A;   Family History  Problem Relation Age of Onset  . Cancer Mother   . Ulcers Father   . Heart attack Brother    History  Substance Use Topics  . Smoking status: Former Smoker -- 1.00 packs/day for 30 years    Types: Cigarettes    Quit date: 05/10/1994  . Smokeless tobacco: Never Used     Comment: smoked off and on  . Alcohol Use: 1.2 oz/week  2 Shots of liquor per week     Comment: 08/14/2013 "shot of scotch maybe twice/wk"    Review of Systems  Constitutional: Positive for activity change, appetite change, fatigue and unexpected weight change. Negative for fever.  Cardiovascular: Positive for palpitations. Negative for chest pain.      Allergies  Metformin and related  Home Medications   Prior to Admission medications   Medication Sig Start Date End Date Taking? Authorizing Provider  aspirin EC 81 MG tablet Take 81 mg by mouth daily.   Yes Historical Provider, MD  atorvastatin (LIPITOR) 40 MG tablet Take 1 tablet (40 mg total) by mouth daily at 6 PM. 10/22/13  Yes Thayer Headings, MD  carvedilol (COREG) 25 MG tablet Take 1 tablet (25 mg total) by mouth 2 (two) times daily with a meal. 03/29/14  Yes Thayer Headings, MD  Cholecalciferol  (VITAMIN D-3 PO) Take 1 tablet by mouth daily.    Yes Historical Provider, MD  donepezil (ARICEPT) 5 MG tablet Take 5 mg by mouth at bedtime.  03/16/14  Yes Historical Provider, MD  finasteride (PROSCAR) 5 MG tablet Take 5 mg by mouth at bedtime.  04/01/14  Yes Historical Provider, MD  glimepiride (AMARYL) 4 MG tablet Take 4 mg by mouth daily after breakfast.    Yes Historical Provider, MD  isosorbide mononitrate (IMDUR) 60 MG 24 hr tablet TAKE ONE AND ONE-HALF TABLETS BY MOUTH TWICE DAILY 06/07/14  Yes Thayer Headings, MD  linagliptin (TRADJENTA) 5 MG TABS tablet Take 5 mg by mouth daily as needed. For blood sugar   Yes Historical Provider, MD  lisinopril (PRINIVIL,ZESTRIL) 10 MG tablet Take 1 tablet (10 mg total) by mouth daily. 04/09/14  Yes Thayer Headings, MD  loratadine (CLARITIN) 10 MG tablet Take 10 mg by mouth daily as needed for allergies.   Yes Historical Provider, MD  Melatonin (RA MELATONIN) 10 MG TABS Take 10 mg by mouth at bedtime.    Yes Historical Provider, MD  Multiple Vitamins-Minerals (PRESERVISION/LUTEIN) CAPS Take 1 capsule by mouth daily.  08/13/10  Yes Romeo Apple, MD  omeprazole (PRILOSEC) 20 MG capsule Take 20 mg by mouth daily.   Yes Historical Provider, MD  Polyethyl Glycol-Propyl Glycol (SYSTANE OP) Place 1 drop into both eyes as directed. 2-4 times a day   Yes Historical Provider, MD  ranolazine (RANEXA) 500 MG 12 hr tablet Take 1 tablet (500 mg total) by mouth 2 (two) times daily. 03/28/14  Yes Thayer Headings, MD  NITROSTAT 0.4 MG SL tablet PLACE 1 TABLET UNDER THE TONGUE EVERY 5 MINUTES AS NEEDED FOR CHEST PAIN 03/27/14   Thayer Headings, MD  warfarin (COUMADIN) 4 MG tablet Take 2-4 mg by mouth daily.  08/16/13   Dayna N Dunn, PA-C   BP 141/63 mmHg  Pulse 70  Temp(Src) 98.6 F (37 C) (Oral)  Resp 18  Ht 5\' 9"  (1.753 m)  Wt 146 lb 11.2 oz (66.543 kg)  BMI 21.65 kg/m2  SpO2 95% Physical Exam  Constitutional: He is oriented to person, place, and time. He appears  well-developed and well-nourished. No distress.  HENT:  Head: Normocephalic and atraumatic.  Eyes: Pupils are equal, round, and reactive to light.  Neck: Normal range of motion.  Cardiovascular: Intact distal pulses.  An irregularly irregular rhythm present.  Pulmonary/Chest: No respiratory distress.  Abdominal: Normal appearance. He exhibits no distension.  Musculoskeletal: Normal range of motion.  Neurological: He is alert and oriented to person, place, and  time. No cranial nerve deficit.  Skin: Skin is warm and dry. No rash noted.  Psychiatric: He has a normal mood and affect. His behavior is normal.  Nursing note and vitals reviewed.   ED Course  Procedures (including critical care time) Labs Review Labs Reviewed  CBC - Abnormal; Notable for the following:    WBC 3.7 (*)    RBC 3.73 (*)    Hemoglobin 12.8 (*)    HCT 36.6 (*)    MCH 34.3 (*)    All other components within normal limits  BASIC METABOLIC PANEL - Abnormal; Notable for the following:    Glucose, Bld 169 (*)    GFR calc non Af Amer 61 (*)    GFR calc Af Amer 71 (*)    All other components within normal limits  PROTIME-INR - Abnormal; Notable for the following:    Prothrombin Time 39.9 (*)    INR 4.08 (*)    All other components within normal limits  URINALYSIS, ROUTINE W REFLEX MICROSCOPIC - Abnormal; Notable for the following:    Hgb urine dipstick TRACE (*)    Ketones, ur 15 (*)    Protein, ur 30 (*)    All other components within normal limits  URINE MICROSCOPIC-ADD ON - Abnormal; Notable for the following:    Casts HYALINE CASTS (*)    All other components within normal limits  HEPATIC FUNCTION PANEL - Abnormal; Notable for the following:    Total Protein 5.9 (*)    Albumin 3.0 (*)    All other components within normal limits  TROPONIN I - Abnormal; Notable for the following:    Troponin I 0.04 (*)    All other components within normal limits  PROTIME-INR - Abnormal; Notable for the following:     Prothrombin Time 34.3 (*)    INR 3.36 (*)    All other components within normal limits  BASIC METABOLIC PANEL - Abnormal; Notable for the following:    Sodium 134 (*)    GFR calc non Af Amer 55 (*)    GFR calc Af Amer 64 (*)    Anion gap 2 (*)    All other components within normal limits  CBC - Abnormal; Notable for the following:    RBC 3.53 (*)    Hemoglobin 12.2 (*)    HCT 35.8 (*)    MCV 101.4 (*)    MCH 34.6 (*)    All other components within normal limits  GLUCOSE, CAPILLARY - Abnormal; Notable for the following:    Glucose-Capillary 112 (*)    All other components within normal limits  URINE CULTURE  TROPONIN I  TSH  GLUCOSE, CAPILLARY  TROPONIN I  I-STAT TROPOININ, ED    Imaging Review Dg Chest 2 View  06/07/2014   CLINICAL DATA:  Increasing weakness for 3 days.  EXAM: CHEST  2 VIEW  COMPARISON:  08/27/2013  FINDINGS: There is moderate hyperinflation, unchanged. There is extensive coarsening of the interstitium, likely fibrotic. This is not significantly changed. No superimposed acute airspace consolidation or edema is evident. There are no effusions. Hilar, mediastinal and cardiac contours are unremarkable and unchanged.  IMPRESSION: Extensive fibrotic appearing interstitial coarsening. No acute findings are evident.   Electronically Signed   By: Andreas Newport M.D.   On: 06/07/2014 19:11     EKG Interpretation   Date/Time:  Friday June 07 2014 17:53:45 EST Ventricular Rate:  88 PR Interval:  160 QRS Duration: 140 QT Interval:  394  QTC Calculation: 476 R Axis:   165 Text Interpretation:  Sinus rhythm with Premature supraventricular  complexes with occasional Premature ventricular complexes Indeterminate  axis Right bundle branch block Abnormal ECG Confirmed by Smrithi Pigford  MD,  Deago Burruss ([id number]) on 06/08/2014 10:54:44 AM      MDM   Final diagnoses:  Chest pain, unspecified chest pain type        Dot Lanes, MD 06/08/14 1055

## 2014-06-07 NOTE — H&P (Addendum)
[facility] Admission History and Physical       [name] VXB:[id number] DOB: 05-Dec-1923 DOA: 06/07/2014  Referring physician: EDP PCP: Irven Shelling, MD  Specialists:   Chief Complaint: Left Sided Chest pain and Weakness  HPI: [name] is a 81 y.o. male with a history of Paroxsymal Atrial Fibrillation, Chronic Systolic CHF, CAD, HTN, DM2, Colon Cancer, Myelodysplastic syndrome who presents to the ED with complaints of Left Sided Chest pain/Discomfort and increased weakness for the past 4 days.  He denies any fever or chill, SOB, nausea or vomiting or diarrhea.    He was evaluated in the ED and had a negative initial workup and was referred for further evaluation.     Review of Systems:  Constitutional: No Weight Loss, No Weight Gain, Night Sweats, Fevers, Chills, Dizziness, Fatigue, or Generalized Weakness HEENT: No Headaches, Difficulty Swallowing,Tooth/Dental Problems,Sore Throat,  No Sneezing, Rhinitis, Ear Ache, Nasal Congestion, or Post Nasal Drip,  Cardio-vascular:  +Chest pain, Orthopnea, PND, Edema in Lower Extremities, Anasarca, Dizziness, Palpitations  Resp: No Dyspnea, No DOE, No Productive Cough, No Non-Productive Cough, No Hemoptysis, No Wheezing.    GI: No Heartburn, Indigestion, Abdominal Pain, Nausea, Vomiting, Diarrhea, Hematemesis, Hematochezia, Melena, Change in Bowel Habits,  Loss of Appetite  GU: No Dysuria, Change in Color of Urine, No Urgency or Frequency, No Flank pain.  Musculoskeletal: No Joint Pain or Swelling, No Decreased Range of Motion, No Back Pain.  Neurologic: No Syncope, No Seizures, Muscle Weakness, Paresthesia, Vision Disturbance or Loss, No Diplopia, No Vertigo, No Difficulty Walking,  Skin: No Rash or Lesions. Psych: No Change in Mood or Affect, No Depression or Anxiety, No Memory loss, No Confusion, or Hallucinations   Past Medical History  Diagnosis Date  . Coronary artery disease     a. s/p PCI w/ DES to mLAD  08/05/10. b. NSTEMI 08/2013 (mildly elev trop) - cath showing widely patent stent, 80% prox D1 (small vessel) with recommendation for medical management, residual 60% ostial PDA, <20% LCx, LVEF 55-65%.   . Hypertension   . Hyperlipidemia   . Barrett esophagus   . Gallstone pancreatitis 2011    a. s/p chole.  . SBO (small bowel obstruction) 2005  . Macrocytic anemia 06/25/2011  . PAF (paroxysmal atrial fibrillation)     a. failed TEE due to inability to pass probe into the esophagus in March 2013. b. On Coumadin. Was in NSR 08/2013 admission.  . Chronic anticoagulation   . Depression   . GERD (gastroesophageal reflux disease)   . Colon cancer     a. 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 b.  "graduated" from their practice 06/2013, remains free of any new disease per those notes.  . Type II diabetes mellitus   . Premature atrial contractions   . Premature ventricular contractions   . Aortic stenosis     a. Mild-mod by echo 2013.  [name] Chronic systolic CHF (congestive heart failure)     a. EF previously 35-40%. b. improved to 55-65% by cath 08/2013.  . Myelodysplastic disease     a. Suspected low-grade  myelodysplastic disease per onc notes.      Past Surgical History  Procedure Laterality Date  . Cholecystectomy  2011  . Right colectomy    . Coronary angioplasty with stent placement  08/05/10    DES to the LAD  . Cardiac catheterization  08/03/10  . Cardiac catheterization  08/09/10    LAD 30, stent OK, CFX 40, RCA <  73  . Abdominal mass resection      Mass near mesentery  . Cystoscopy      "with removal of kidney stone in office"  . Cataract extraction Bilateral   . Appendectomy      "took out during colon surgery"  . Cystoscopy with retrograde pyelogram, ureteroscopy and stent placement Bilateral 05/18/2013    Procedure: CYSTOSCOPY WITH BILATERAL RETROGRADE PYELOGRAM, LEFT URETEROSCOPY AND LEFT STENT PLACEMENT;  Surgeon: Alexis Frock, MD;  Location: [facility];  Service:  Urology;  Laterality: Bilateral;  . Colon surgery    . Tonsillectomy    . Left heart catheterization with coronary angiogram N/A 08/15/2013    Procedure: LEFT HEART CATHETERIZATION WITH CORONARY ANGIOGRAM;  Surgeon: Peter M Martinique, MD;  Location: [facility] CATH LAB;  Service: Cardiovascular;  Laterality: N/A;       Prior to Admission medications   Medication Sig Start Date End Date Taking? Authorizing Provider  aspirin EC 81 MG tablet Take 81 mg by mouth daily.   Yes Historical Provider, MD  atorvastatin (LIPITOR) 40 MG tablet Take 1 tablet (40 mg total) by mouth daily at 6 PM. 10/22/13  Yes Thayer Headings, MD  carvedilol (COREG) 25 MG tablet Take 1 tablet (25 mg total) by mouth 2 (two) times daily with a meal. 03/29/14  Yes Thayer Headings, MD  Cholecalciferol (VITAMIN D-3 PO) Take 1 tablet by mouth daily.    Yes Historical Provider, MD  donepezil (ARICEPT) 5 MG tablet Take 5 mg by mouth at bedtime.  03/16/14  Yes Historical Provider, MD  finasteride (PROSCAR) 5 MG tablet Take 5 mg by mouth at bedtime.  04/01/14  Yes Historical Provider, MD  glimepiride (AMARYL) 4 MG tablet Take 4 mg by mouth daily after breakfast.    Yes Historical Provider, MD  isosorbide mononitrate (IMDUR) 60 MG 24 hr tablet TAKE ONE AND ONE-HALF TABLETS BY MOUTH TWICE DAILY 06/07/14  Yes Thayer Headings, MD  linagliptin (TRADJENTA) 5 MG TABS tablet Take 5 mg by mouth daily as needed. For blood sugar   Yes Historical Provider, MD  lisinopril (PRINIVIL,ZESTRIL) 10 MG tablet Take 1 tablet (10 mg total) by mouth daily. 04/09/14  Yes Thayer Headings, MD  loratadine (CLARITIN) 10 MG tablet Take 10 mg by mouth daily as needed for allergies.   Yes Historical Provider, MD  Melatonin (RA MELATONIN) 10 MG TABS Take 10 mg by mouth at bedtime.    Yes Historical Provider, MD  Multiple Vitamins-Minerals (PRESERVISION/LUTEIN) CAPS Take 1 capsule by mouth daily.  08/13/10  Yes Romeo Apple, MD  omeprazole (PRILOSEC) 20 MG capsule Take 20 mg by  mouth daily.   Yes Historical Provider, MD  Polyethyl Glycol-Propyl Glycol (SYSTANE OP) Place 1 drop into both eyes as directed. 2-4 times a day   Yes Historical Provider, MD  ranolazine (RANEXA) 500 MG 12 hr tablet Take 1 tablet (500 mg total) by mouth 2 (two) times daily. 03/28/14  Yes Thayer Headings, MD  NITROSTAT 0.4 MG SL tablet PLACE 1 TABLET UNDER THE TONGUE EVERY 5 MINUTES AS NEEDED FOR CHEST PAIN 03/27/14   Thayer Headings, MD  warfarin (COUMADIN) 4 MG tablet Take 2-4 mg by mouth daily.  08/16/13   Dayna N Dunn, PA-C      Allergies  Allergen Reactions  . Metformin And Related Nausea And Vomiting     Social History:  reports that he quit smoking about 20 years ago. His smoking use included Cigarettes. He has a  30 pack-year smoking history. He has never used smokeless tobacco. He reports that he drinks about 1.2 oz of alcohol per week. He reports that he does not use illicit drugs.     Family History  Problem Relation Age of Onset  . Cancer Mother   . Ulcers Father   . Heart attack Brother        Physical Exam:  GEN:  Pleasant Elderly Thin  81 y.o.Caucasian male examined and in no acute distress; cooperative with exam Filed Vitals:   06/07/14 2108 06/07/14 2115 06/07/14 2200 06/07/14 2230  BP: 131/57 153/59 153/60 157/59  Pulse: 69 56 83 69  Temp: 98.3 F (36.8 C)     TempSrc: Oral     Resp: 22 19 16 19   Height:      Weight:      SpO2: 98% 98% 98% 98%   Blood pressure 157/59, pulse 69, temperature 98.3 F (36.8 C), temperature source Oral, resp. rate 19, height 5\' 9"  (1.753 m), weight 68.04 kg (150 lb), SpO2 98 %. PSYCH: He is alert and oriented x4; does not appear anxious does not appear depressed; affect is normal HEENT: Normocephalic and Atraumatic, Mucous membranes pink; PERRLA; EOM intact; Fundi:  Benign;  No scleral icterus, Nares: Patent, Oropharynx: Clear, Sparse Dentition,    Neck:  FROM, No Cervical Lymphadenopathy nor Thyromegaly or Carotid Bruit; No  JVD; Breasts:: Not examined CHEST WALL: No tenderness CHEST: Normal respiration, clear to auscultation bilaterally HEART: Irregular rhythm; no murmurs rubs or gallops BACK: No kyphosis or scoliosis; No CVA tenderness ABDOMEN: Positive Bowel Sounds, Scaphoid, Soft Non-Tender; No Masses, No Organomegaly. Rectal Exam: Not done EXTREMITIES: No Cyanosis, Clubbing, or Edema; No Ulcerations. Genitalia: not examined PULSES: 2+ and symmetric SKIN: Normal hydration no rash or ulceration CNS:  Alert and Oriented x 4, No Focal Deficits  Vascular: pulses palpable throughout    Labs on Admission:  Basic Metabolic Panel:  Recent Labs Lab 06/07/14 1803  NA 135  K 3.9  CL 104  CO2 22  GLUCOSE 169*  BUN 15  CREATININE 1.05  CALCIUM 8.6   Liver Function Tests: No results for input(s): AST, ALT, ALKPHOS, BILITOT, PROT, ALBUMIN in the last 168 hours. No results for input(s): LIPASE, AMYLASE in the last 168 hours. No results for input(s): AMMONIA in the last 168 hours. CBC:  Recent Labs Lab 06/07/14 1803  WBC 3.7*  HGB 12.8*  HCT 36.6*  MCV 98.1  PLT 164   Cardiac Enzymes: No results for input(s): CKTOTAL, CKMB, CKMBINDEX, TROPONINI in the last 168 hours.  BNP (last 3 results) No results for input(s): PROBNP in the last 8760 hours. CBG: No results for input(s): GLUCAP in the last 168 hours.  Radiological Exams on Admission: Dg Chest 2 View  06/07/2014   CLINICAL DATA:  Increasing weakness for 3 days.  EXAM: CHEST  2 VIEW  COMPARISON:  08/27/2013  FINDINGS: There is moderate hyperinflation, unchanged. There is extensive coarsening of the interstitium, likely fibrotic. This is not significantly changed. No superimposed acute airspace consolidation or edema is evident. There are no effusions. Hilar, mediastinal and cardiac contours are unremarkable and unchanged.  IMPRESSION: Extensive fibrotic appearing interstitial coarsening. No acute findings are evident.   Electronically Signed    By: Andreas Newport M.D.   On: 06/07/2014 19:11     EKG: Independently reviewed. Irregular rate 88 with +RBBB, and PVC.      Assessment/Plan:   81 y.o. male with  Principal Problem:  1.    Chest pain   Telemetry Monitoring   Cycle Troponins   Continue Imdur, Coreg, ASA, Atorvastatin, and NTG SL PRN     Active Problems:   2.    Weakness generalized   Check Orthostatics   Check TSH level     3.    Coronary artery disease   See #1     4.    Atrial fibrillation   On Coreg, and Coumadin   Coumadin Level mildly Elevated, on hold for now     Re-check in AM     5.   Chronic systolic CHF (congestive heart failure)   Continue Coreg, Lisinopril Rx     6.   Hypertension   Continue Corge, Lisinopril   Monitor BPs     7.   Diabetes mellitus   On Tradjenta, and Amaryl Rx    Holding Tradjenta   SSI coverage PRN   Check HbA1C level     8.   DVT Prophylaxis    On Coumadin Rx      Code Status:    FULL CODE Family Communication:    No Family Present Disposition Plan:     Observation /Telemetry    Time spent:  59 Minutes  Theressa Millard [facility] Pager [phone]   If [place] [place] Please Contact the Day Rounding Team MD for [facility]  If 7PM-7AM, Please Contact Night-Floor Coverage  [email or web address] Password [facility] 06/07/2014, 10:46 PM

## 2014-06-07 NOTE — Progress Notes (Signed)
   06/07/14 2327  Vitals  Temp 97.9 F (36.6 C)  Temp Source Oral  BP (!) 153/71 mmHg  BP Location Left Arm  BP Method Automatic  Patient Position (if appropriate) Lying  Oxygen Therapy  SpO2 98 %  O2 Device Room Air  Pain Assessment  Pain Assessment No/denies pain  Height and Weight  Height 5\' 9"  (1.753 m)  Weight 66.543 kg (146 lb 11.2 oz)  Type of Scale Used Standing (scale B)  Type of Weight Actual  BSA (Calculated - sq m) 1.8 sq meters  BMI (Calculated) 21.7  Weight in (lb) to have BMI = 25 168.9  Admitted pt to rm 3E12 from ED via stretcher, pt alert and oriented, denied pain at this time. Oriented to room, call bell placed within reach, admission assessment done, orders carried out. Will continue to monitor.

## 2014-06-07 NOTE — ED Notes (Signed)
Pt here for cp for the past week with nausea and lack of energy. The pain on the left side of his chest and abd. No radiation of pain. In on coumadin for afib.

## 2014-06-08 DIAGNOSIS — R079 Chest pain, unspecified: Secondary | ICD-10-CM

## 2014-06-08 LAB — CBC
HCT: 35.8 % — ABNORMAL LOW (ref 39.0–52.0)
Hemoglobin: 12.2 g/dL — ABNORMAL LOW (ref 13.0–17.0)
MCH: 34.6 pg — AB (ref 26.0–34.0)
MCHC: 34.1 g/dL (ref 30.0–36.0)
MCV: 101.4 fL — AB (ref 78.0–100.0)
Platelets: 154 10*3/uL (ref 150–400)
RBC: 3.53 MIL/uL — ABNORMAL LOW (ref 4.22–5.81)
RDW: 13.7 % (ref 11.5–15.5)
WBC: 4.2 10*3/uL (ref 4.0–10.5)

## 2014-06-08 LAB — GLUCOSE, CAPILLARY
GLUCOSE-CAPILLARY: 202 mg/dL — AB (ref 70–99)
Glucose-Capillary: 112 mg/dL — ABNORMAL HIGH (ref 70–99)
Glucose-Capillary: 77 mg/dL (ref 70–99)

## 2014-06-08 LAB — BASIC METABOLIC PANEL
Anion gap: 2 — ABNORMAL LOW (ref 5–15)
BUN: 11 mg/dL (ref 6–23)
CALCIUM: 8.4 mg/dL (ref 8.4–10.5)
CO2: 25 mmol/L (ref 19–32)
Chloride: 107 mmol/L (ref 96–112)
Creatinine, Ser: 1.15 mg/dL (ref 0.50–1.35)
GFR calc Af Amer: 64 mL/min — ABNORMAL LOW (ref >90)
GFR calc non Af Amer: 55 mL/min — ABNORMAL LOW (ref >90)
Glucose, Bld: 78 mg/dL (ref 70–99)
Potassium: 3.5 mmol/L (ref 3.5–5.1)
Sodium: 134 mmol/L — ABNORMAL LOW (ref 135–145)

## 2014-06-08 LAB — PROTIME-INR
INR: 3.36 — AB (ref 0.00–1.49)
Prothrombin Time: 34.3 seconds — ABNORMAL HIGH (ref 11.6–15.2)

## 2014-06-08 LAB — TSH: TSH: 1.831 u[IU]/mL (ref 0.350–4.500)

## 2014-06-08 LAB — TROPONIN I
TROPONIN I: 0.03 ng/mL (ref <0.031)
Troponin I: 0.03 ng/mL (ref <0.031)

## 2014-06-08 MED ORDER — ISOSORBIDE MONONITRATE ER 60 MG PO TB24: 90.0000 mg | ORAL_TABLET | Freq: Every day | ORAL | Status: DC

## 2014-06-08 MED ORDER — WARFARIN SODIUM 4 MG PO TABS: 2.0000 mg | ORAL_TABLET | Freq: Every day | ORAL | Status: DC

## 2014-06-08 NOTE — Progress Notes (Signed)
Pt d/c'd to home with family. D/c rx and instructions given to and d/w pt.

## 2014-06-08 NOTE — Consult Note (Signed)
Cardiologist:  Nahser Reason for Consult:  Chest pain Referring Physician:  [name] is an 81 y.o. male.  HPI:    The patient is an 81yo male with a history of CAD with prior PCI to the LAD last year. Other issues include chronic PVC's and PACs, HTN, DM, HLD and systolic heart failure. He now has atrial fib and will be managed with rate control and anticoagulation. He had a failed attempt at [place] in March due to inability to pass the probe into the esophagus. His EF is 35 to 40% with mild to moderate AS.  He had a stress test in Nov. 2013 that was normal.  He had a left heart cath in April 2015 which revealed single vessel disease obstructive disease in a small diag.  The LAD stent was patent. Normal LVF.    The patient presented yesterday with CP.  Troponin 0.04 the first draw and 0.03 the next two.  He reports the CP was like an ache mid axillary line left side, 3-4/10.  Not worse with exertion.  No N, V, fever, SOB, orthopnea, PND, LEE.  It is different than his angina which is across his chest.  It has resolved.  He also reports staggering and unsteadiness on his feet and feels better this morning.  He has been loosing weight  Past Medical History  Diagnosis Date  . Coronary artery disease     a. s/p PCI w/ DES to mLAD 08/05/10. b. NSTEMI 08/2013 (mildly elev trop) - cath showing widely patent stent, 80% prox D1 (small vessel) with recommendation for medical management, residual 60% ostial PDA, <20% LCx, LVEF 55-65%.   . Hypertension   . Hyperlipidemia   . Barrett esophagus   . Gallstone pancreatitis 2011    a. s/p chole.  . SBO (small bowel obstruction) 2005  . Macrocytic anemia 06/25/2011  . PAF (paroxysmal atrial fibrillation)     a. failed TEE due to inability to pass probe into the esophagus in March 2013. b. On Coumadin. Was in NSR 08/2013 admission.  . Chronic anticoagulation   . Depression   . GERD (gastroesophageal reflux disease)   . Colon cancer     a.  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 b.  "graduated" from their practice 06/2013, remains free of any new disease per those notes.  . Type II diabetes mellitus   . Premature atrial contractions   . Premature ventricular contractions   . Aortic stenosis     a. Mild-mod by echo 2013.  [name] Chronic systolic CHF (congestive heart failure)     a. EF previously 35-40%. b. improved to 55-65% by cath 08/2013.  . Myelodysplastic disease     a. Suspected low-grade  myelodysplastic disease per onc notes.    Past Surgical History  Procedure Laterality Date  . Cholecystectomy  2011  . Right colectomy    . Coronary angioplasty with stent placement  08/05/10    DES to the LAD  . Cardiac catheterization  08/03/10  . Cardiac catheterization  08/09/10    LAD 30, stent OK, CFX 40, RCA < 20  . Abdominal mass resection      Mass near mesentery  . Cystoscopy      "with removal of kidney stone in office"  . Cataract extraction Bilateral   . Appendectomy      "took out during colon surgery"  . Cystoscopy with retrograde pyelogram, ureteroscopy and stent placement Bilateral 05/18/2013  Procedure: CYSTOSCOPY WITH BILATERAL RETROGRADE PYELOGRAM, LEFT URETEROSCOPY AND LEFT STENT PLACEMENT;  Surgeon: Alexis Frock, MD;  Location: [facility];  Service: Urology;  Laterality: Bilateral;  . Colon surgery    . Tonsillectomy    . Left heart catheterization with coronary angiogram N/A 08/15/2013    Procedure: LEFT HEART CATHETERIZATION WITH CORONARY ANGIOGRAM;  Surgeon: Peter M Martinique, MD;  Location: [facility] C[facility];  Service: Cardiovascular;  Laterality: N/A;    Family History  Problem Relation Age of Onset  . Cancer Mother   . Ulcers Father   . Heart attack Brother     Social History:  reports that he quit smoking about 20 years ago. His smoking use included Cigarettes. He has a 30 pack-year smoking history. He has never used smokeless tobacco. He reports that he drinks about 1.2 oz of alcohol per week. He  reports that he does not use illicit drugs.  Allergies:  Allergies  Allergen Reactions  . Metformin And Related Nausea And Vomiting    Medications: Scheduled Meds: . aspirin EC  325 mg Oral Daily  . atorvastatin  40 mg Oral q1800  . carvedilol  25 mg Oral BID WC  . cholecalciferol  3,000 Units Oral Daily  . donepezil  5 mg Oral QHS  . finasteride  5 mg Oral QHS  . glimepiride  4 mg Oral QPC breakfast  . insulin aspart  0-5 Units Subcutaneous QHS  . insulin aspart  0-9 Units Subcutaneous TID WC  . isosorbide mononitrate  90 mg Oral BID  . lisinopril  10 mg Oral Daily  . multivitamin with minerals  1 tablet Oral Daily  . pantoprazole  40 mg Oral Daily  . ranolazine  500 mg Oral BID  . sodium chloride  3 mL Intravenous Q12H  . sodium chloride  3 mL Intravenous Q12H  . Warfarin - Physician Dosing Inpatient   Does not apply q1800   Continuous Infusions:  PRN Meds:.sodium chloride, acetaminophen **OR** acetaminophen, alum & mag hydroxide-simeth, HYDROmorphone (DILAUDID) injection, loratadine, nitroGLYCERIN, ondansetron **OR** ondansetron (ZOFRAN) IV, oxyCODONE, polyvinyl alcohol, sodium chloride   Results for orders placed or performed during the hospital encounter of 06/07/14 (from the past 48 hour(s))  CBC     Status: Abnormal   Collection Time: 06/07/14  6:03 PM  Result Value Ref Range   WBC 3.7 (L) 4.0 - 10.5 K/uL   RBC 3.73 (L) 4.22 - 5.81 MIL/uL   Hemoglobin 12.8 (L) 13.0 - 17.0 g/dL   HCT 36.6 (L) 39.0 - 52.0 %   MCV 98.1 78.0 - 100.0 fL   MCH 34.3 (H) 26.0 - 34.0 pg   MCHC 35.0 30.0 - 36.0 g/dL   RDW 13.3 11.5 - 15.5 %   Platelets 164 150 - 400 K/uL  Basic metabolic panel     Status: Abnormal   Collection Time: 06/07/14  6:03 PM  Result Value Ref Range   Sodium 135 135 - 145 mmol/L   Potassium 3.9 3.5 - 5.1 mmol/L   Chloride 104 96 - 112 mmol/L   CO2 22 19 - 32 mmol/L   Glucose, Bld 169 (H) 70 - 99 mg/dL   BUN 15 6 - 23 mg/dL   Creatinine, Ser 1.05 0.50 - 1.35  mg/dL   Calcium 8.6 8.4 - 10.5 mg/dL   GFR calc non Af Amer 61 (L) >90 mL/min   GFR calc Af Amer 71 (L) >90 mL/min    Comment: (NOTE) The eGFR has been calculated using the  CKD EPI equation. This calculation has not been validated in all clinical situations. eGFR's persistently <90 mL/min signify possible Chronic Kidney Disease.    Anion gap 9 5 - 15  Protime-INR (if pt is taking Coumadin)     Status: Abnormal   Collection Time: 06/07/14  6:03 PM  Result Value Ref Range   Prothrombin Time 39.9 (H) 11.6 - 15.2 seconds   INR 4.08 (H) 0.00 - 1.49  Urinalysis, Routine w reflex microscopic     Status: Abnormal   Collection Time: 06/07/14  7:24 PM  Result Value Ref Range   Color, Urine YELLOW YELLOW   APPearance CLEAR CLEAR   Specific Gravity, Urine 1.025 1.005 - 1.030   pH 5.5 5.0 - 8.0   Glucose, UA NEGATIVE NEGATIVE mg/dL   Hgb urine dipstick TRACE (A) NEGATIVE   Bilirubin Urine NEGATIVE NEGATIVE   Ketones, ur 15 (A) NEGATIVE mg/dL   Protein, ur 30 (A) NEGATIVE mg/dL   Urobilinogen, UA 0.2 0.0 - 1.0 mg/dL   Nitrite NEGATIVE NEGATIVE   Leukocytes, UA NEGATIVE NEGATIVE  Urine microscopic-add on     Status: Abnormal   Collection Time: 06/07/14  7:24 PM  Result Value Ref Range   WBC, UA 0-2 <3 WBC/hpf   RBC / HPF 3-6 <3 RBC/hpf   Casts HYALINE CASTS (A) NEGATIVE   Urine-Other MUCOUS PRESENT   Hepatic function panel     Status: Abnormal   Collection Time: 06/07/14  9:27 PM  Result Value Ref Range   Total Protein 5.9 (L) 6.0 - 8.3 g/dL   Albumin 3.0 (L) 3.5 - 5.2 g/dL   AST 35 0 - 37 U/L   ALT 27 0 - 53 U/L   Alkaline Phosphatase 40 39 - 117 U/L   Total Bilirubin 0.6 0.3 - 1.2 mg/dL   Bilirubin, Direct <0.1 0.0 - 0.5 mg/dL    Comment: Please note change in reference range. REPEATED TO VERIFY    Indirect Bilirubin NOT CALCULATED 0.3 - 0.9 mg/dL  Troponin I (q 6hr x 3)     Status: Abnormal   Collection Time: 06/07/14  9:27 PM  Result Value Ref Range   Troponin I 0.04 (H)  <0.031 ng/mL    Comment:        PERSISTENTLY INCREASED TROPONIN VALUES IN THE RANGE OF 0.04-0.49 ng/mL CAN BE SEEN IN:       -UNSTABLE ANGINA       -CONGESTIVE HEART FAILURE       -MYOCARDITIS       -CHEST TRAUMA       -ARRYHTHMIAS       -LATE PRESENTING MYOCARDIAL INFARCTION       -COPD   CLINICAL FOLLOW-UP RECOMMENDED.   I-stat troponin, ED (not at [facility])     Status: None   Collection Time: 06/07/14  9:31 PM  Result Value Ref Range   Troponin i, poc 0.01 0.00 - 0.08 ng/mL   Comment 3            Comment: Due to the release kinetics of cTnI, a negative result within the first hours of the onset of symptoms does not rule out myocardial infarction with certainty. If myocardial infarction is still suspected, repeat the test at appropriate intervals.   Glucose, capillary     Status: Abnormal   Collection Time: 06/08/14 12:03 AM  Result Value Ref Range   Glucose-Capillary 112 (H) 70 - 99 mg/dL  Troponin I (q 6hr x 3)     Status:  None   Collection Time: 06/08/14  4:55 AM  Result Value Ref Range   Troponin I 0.03 <0.031 ng/mL    Comment:        NO INDICATION OF MYOCARDIAL INJURY.   Protime-INR     Status: Abnormal   Collection Time: 06/08/14  4:55 AM  Result Value Ref Range   Prothrombin Time 34.3 (H) 11.6 - 15.2 seconds   INR 3.36 (H) 0.00 - 1.49  Basic metabolic panel     Status: Abnormal   Collection Time: 06/08/14  4:55 AM  Result Value Ref Range   Sodium 134 (L) 135 - 145 mmol/L   Potassium 3.5 3.5 - 5.1 mmol/L   Chloride 107 96 - 112 mmol/L   CO2 25 19 - 32 mmol/L   Glucose, Bld 78 70 - 99 mg/dL   BUN 11 6 - 23 mg/dL   Creatinine, Ser 1.66 0.50 - 1.35 mg/dL   Calcium 8.4 8.4 - 41.8 mg/dL   GFR calc non Af Amer 55 (L) >90 mL/min   GFR calc Af Amer 64 (L) >90 mL/min    Comment: (NOTE) The eGFR has been calculated using the CKD EPI equation. This calculation has not been validated in all clinical situations. eGFR's persistently <90 mL/min signify possible Chronic  Kidney Disease.    Anion gap 2 (L) 5 - 15    Comment: REPEATED TO VERIFY  CBC     Status: Abnormal   Collection Time: 06/08/14  4:55 AM  Result Value Ref Range   WBC 4.2 4.0 - 10.5 K/uL   RBC 3.53 (L) 4.22 - 5.81 MIL/uL   Hemoglobin 12.2 (L) 13.0 - 17.0 g/dL   HCT 30.4 (L) 30.6 - 31.7 %   MCV 101.4 (H) 78.0 - 100.0 fL   MCH 34.6 (H) 26.0 - 34.0 pg   MCHC 34.1 30.0 - 36.0 g/dL   RDW 92.1 30.3 - 97.4 %   Platelets 154 150 - 400 K/uL  TSH     Status: None   Collection Time: 06/08/14  4:55 AM  Result Value Ref Range   TSH 1.831 0.350 - 4.500 uIU/mL  Glucose, capillary     Status: None   Collection Time: 06/08/14  6:08 AM  Result Value Ref Range   Glucose-Capillary 77 70 - 99 mg/dL   Comment 1 Notify RN   Troponin I (q 6hr x 3)     Status: None   Collection Time: 06/08/14 11:15 AM  Result Value Ref Range   Troponin I 0.03 <0.031 ng/mL    Comment:        NO INDICATION OF MYOCARDIAL INJURY.   Glucose, capillary     Status: Abnormal   Collection Time: 06/08/14 11:48 AM  Result Value Ref Range   Glucose-Capillary 202 (H) 70 - 99 mg/dL   Comment 1 Notify RN     Dg Chest 2 View  06/07/2014   CLINICAL DATA:  Increasing weakness for 3 days.  EXAM: CHEST  2 VIEW  COMPARISON:  08/27/2013  FINDINGS: There is moderate hyperinflation, unchanged. There is extensive coarsening of the interstitium, likely fibrotic. This is not significantly changed. No superimposed acute airspace consolidation or edema is evident. There are no effusions. Hilar, mediastinal and cardiac contours are unremarkable and unchanged.  IMPRESSION: Extensive fibrotic appearing interstitial coarsening. No acute findings are evident.   Electronically Signed   By: Ellery Plunk M.D.   On: 06/07/2014 19:11    Review of Systems  Constitutional: Negative for  fever and diaphoresis.  HENT: Negative for congestion and sore throat.   Respiratory: Negative for cough and shortness of breath.   Cardiovascular: Positive for  chest pain (left mid-axillary line). Negative for orthopnea, leg swelling and PND.  Gastrointestinal: Negative for nausea, vomiting and abdominal pain.  Genitourinary: Negative for dysuria.  Musculoskeletal: Negative for myalgias.  Neurological: Positive for weakness. Negative for dizziness.  All other systems reviewed and are negative.  Blood pressure 141/63, pulse 70, temperature 98.6 F (37 C), temperature source Oral, resp. rate 18, height $RemoveBe'5\' 9"'[id number]$  (1.753 m), weight 146 lb 11.2 oz (66.543 kg), SpO2 95 %. Physical Exam  Nursing note and vitals reviewed. Constitutional: He is oriented to person, place, and time. He appears well-developed and well-nourished. No distress.  HENT:  Head: Normocephalic and atraumatic.  Eyes: EOM are normal. Pupils are equal, round, and reactive to light. No scleral icterus.  Neck: Normal range of motion. Neck supple. No JVD present.  Cardiovascular: Normal rate.  An irregularly irregular rhythm present.  No murmur heard. Pulses:      Radial pulses are 2+ on the right side, and 2+ on the left side.       Dorsalis pedis pulses are 2+ on the right side, and 2+ on the left side.  Possible right carotid bruit  Respiratory: Effort normal and breath sounds normal. He has no wheezes. He has no rales.  GI: Soft. Bowel sounds are normal. He exhibits no distension. There is no tenderness.  Musculoskeletal: He exhibits no edema.  Neurological: He is alert and oriented to person, place, and time. He exhibits normal muscle tone.  Skin: Skin is warm and dry.  Psychiatric: He has a normal mood and affect.    Assessment/Plan: Principal Problem:   Chest pain Active Problems:   Hypertension   Diabetes mellitus   Coronary artery disease   Atrial fibrillation   Chronic systolic CHF (congestive heart failure)   Weakness generalized  Plan:   CP sounds noncardiac despite initial troponin of 0.04.  This He had a left heart cath in April 2015 with disease in a small diag  and patent LAD stent.  We will decrease imdur to 90 daily from BID.     Tarri Fuller, [place] 06/08/2014, 1:57 PM    I have seen and examined the patient along with HAGER, BRYAN, PAC.  I have reviewed the chart, notes and new data.  I agree with PA's note.  Symptoms are quite distinct from his usual angina. Low risk ECG and enzymes. Orthostatic hypotension, symptomatic.  PLAN: Reduce nitrate dose. No plan for further cardiac work up at this time.  Sanda Klein, MD, [place] [phone] 06/08/2014, 3:09 PM

## 2014-06-08 NOTE — Progress Notes (Signed)
Patient ID: [name]  male  UMP:[id number]    DOB: Jan 17, 1924    DOA: 06/07/2014  PCP: Irven Shelling, MD   Primary cardiologist: Dr [name]  Brief history of present illness  Patient is a 81 year old male with paroxysmal A. fib, chronic left systolic CHF, CAD, hypertension, diabetes, colon cancer, myelodysplastic syndrome presented to ED with complaints of left-sided chest tightness, increased nausea, weakness for the past 4 days. Otherwise denied any fevers, chills, shortness of breath, vomiting or diarrhea. Patient was admitted for further workup. Patient is on Coumadin for atrial fibrillation, INR was 4.08.  Assessment/Plan: Principal Problem:   Chest pain : Risk factors diabetes, hypertension, hyperlipidemia, prior history of CAD: - Prior cardiac cath (Dr Martinique)  in 4/15 had shown widely patent stent in LAD, 30% disease in mid and distal vessel, first diagonal small and 80% stenosis proximally, focal 60% in the ostium of the PDA,  EF 55-65% . Recommended medical therapy. -  2-D echo in 07/2011 showed EF of 35-40%, mild to moderate aortic stenosis  - Cardiology consult called, discussed with Dr. Wynonia Lawman, with repeat echo to assess EF and aortic stenosis, patient may need repeat cardiac cath, will defer to cardiology  - Trop 0.04-> 0.03, ekg with NSR, PVC's, RBBB - Continue aspirin, Coreg, statin, Imdur, lisinopril, ranexa  Active Problems:   Hypertension - Continue Coreg, Imdur     Diabetes mellitus - Continue sliding scale insulin, Amaryl     Coronary artery disease:refer to  #1     Atrial fibrillation - Currently heart rate controlled, continue Coreg, Coumadin on hold, supratherapeutic INR     Chronic systolic CHF (congestive heart failure) - EF normal on cardiac cath, not on any diuretics prior to admission     Weakness generalized - start PTOT, UA negative for UTI    DVT Prophylaxis: on Coumadin, INR supratherapeutic   Code Status: Full   Family  Communication:  Disposition:  Consultants:  Cardiology- Dr Wynonia Lawman   Procedures:  none  Antibiotics:  None    Subjective: Pt seen and examined, denies any chest pain nausea, vomiting at this time.    Objective: Weight change:   Intake/Output Summary (Last 24 hours) at 06/08/14 0941 Last data filed at 06/08/14 0904  Gross per 24 hour  Intake    600 ml  Output    825 ml  Net   -225 ml   Blood pressure 141/63, pulse 70, temperature 98.6 F (37 C), temperature source Oral, resp. rate 18, height 5\' 9"  (1.753 m), weight 66.543 kg (146 lb 11.2 oz), SpO2 95 %.  Physical Exam: General: Alert and awake, oriented x3, not in any acute distress. CVS: S1-S2 clear, no murmur rubs or gallops Chest: clear to auscultation bilaterally, no wheezing, rales or rhonchi, no chest wall tenderness  Abdomen: soft nontender, nondistended, normal bowel sounds  Extremities: no cyanosis, clubbing or edema noted bilaterally Neuro: Cranial nerves II-XII intact, no focal neurological deficits  Lab Results: Basic Metabolic Panel:  Recent Labs Lab 06/07/14 1803 06/08/14 0455  NA 135 134*  K 3.9 3.5  CL 104 107  CO2 22 25  GLUCOSE 169* 78  BUN 15 11  CREATININE 1.05 1.15  CALCIUM 8.6 8.4   Liver Function Tests:  Recent Labs Lab 06/07/14 2127  AST 35  ALT 27  ALKPHOS 40  BILITOT 0.6  PROT 5.9*  ALBUMIN 3.0*   No results for input(s): LIPASE, AMYLASE in the last 168 hours. No results for input(s): AMMONIA  in the last 168 hours. CBC:  Recent Labs Lab 06/07/14 1803 06/08/14 0455  WBC 3.7* 4.2  HGB 12.8* 12.2*  HCT 36.6* 35.8*  MCV 98.1 101.4*  PLT 164 154   Cardiac Enzymes:  Recent Labs Lab 06/07/14 2127 06/08/14 0455  TROPONINI 0.04* 0.03   BNP: Invalid input(s): POCBNP CBG:  Recent Labs Lab 06/08/14 0003 06/08/14 0608  GLUCAP 112* 77     Micro Results: No results found for this or any previous visit (from the past 240 hour(s)).  Studies/Results: Dg  Chest 2 View  06/07/2014   CLINICAL DATA:  Increasing weakness for 3 days.  EXAM: CHEST  2 VIEW  COMPARISON:  08/27/2013  FINDINGS: There is moderate hyperinflation, unchanged. There is extensive coarsening of the interstitium, likely fibrotic. This is not significantly changed. No superimposed acute airspace consolidation or edema is evident. There are no effusions. Hilar, mediastinal and cardiac contours are unremarkable and unchanged.  IMPRESSION: Extensive fibrotic appearing interstitial coarsening. No acute findings are evident.   Electronically Signed   By: Andreas Newport M.D.   On: 06/07/2014 19:11    Medications: Scheduled Meds: . aspirin EC  325 mg Oral Daily  . atorvastatin  40 mg Oral q1800  . carvedilol  25 mg Oral BID WC  . cholecalciferol  3,000 Units Oral Daily  . donepezil  5 mg Oral QHS  . finasteride  5 mg Oral QHS  . glimepiride  4 mg Oral QPC breakfast  . insulin aspart  0-5 Units Subcutaneous QHS  . insulin aspart  0-9 Units Subcutaneous TID WC  . isosorbide mononitrate  90 mg Oral BID  . lisinopril  10 mg Oral Daily  . multivitamin with minerals  1 tablet Oral Daily  . pantoprazole  40 mg Oral Daily  . ranolazine  500 mg Oral BID  . sodium chloride  3 mL Intravenous Q12H  . sodium chloride  3 mL Intravenous Q12H  . Warfarin - Physician Dosing Inpatient   Does not apply q1800      LOS: 1 day   RAI,RIPUDEEP M.D. [facility] 06/08/2014, 9:41 AM Pager: [phone]  If 7PM-7AM, please contact night-coverage [email or web address] Password [email or web address]

## 2014-06-08 NOTE — Discharge Summary (Signed)
Physician Discharge Summary  Patient ID: [name] MRN: [id number] DOB/AGE: 15-Apr-1924 81 y.o.  Admit date: 06/07/2014 Discharge date: 06/08/2014  Primary Care Physician:  Irven Shelling, MD  Discharge Diagnoses:    . Atypical Chest pain . Atrial fibrillation . Hypertension . Coronary artery disease . Chronic systolic CHF (congestive heart failure)   Orthostatic hypotension  Consults: Cardiology   Recommendations for Outpatient Follow-up:  Per cardiology, chest pain sounds noncardiac due to symptomatic orthostatis imdur decreased to 90 mg daily  TESTS THAT NEED FOLLOW-UP PT/INR   DIET: carb modified     Allergies:   Allergies  Allergen Reactions  . Metformin And Related Nausea And Vomiting     Discharge Medications:   Medication List    TAKE these medications        aspirin EC 81 MG tablet  Take 81 mg by mouth daily.     atorvastatin 40 MG tablet  Commonly known as:  LIPITOR  Take 1 tablet (40 mg total) by mouth daily at 6 PM.     carvedilol 25 MG tablet  Commonly known as:  COREG  Take 1 tablet (25 mg total) by mouth 2 (two) times daily with a meal.     donepezil 5 MG tablet  Commonly known as:  ARICEPT  Take 5 mg by mouth at bedtime.     finasteride 5 MG tablet  Commonly known as:  PROSCAR  Take 5 mg by mouth at bedtime.     glimepiride 4 MG tablet  Commonly known as:  AMARYL  Take 4 mg by mouth daily after breakfast.     isosorbide mononitrate 60 MG 24 hr tablet  Commonly known as:  IMDUR  Take 1.5 tablets (90 mg total) by mouth daily.     linagliptin 5 MG Tabs tablet  Commonly known as:  TRADJENTA  Take 5 mg by mouth daily as needed. For blood sugar     lisinopril 10 MG tablet  Commonly known as:  PRINIVIL,ZESTRIL  Take 1 tablet (10 mg total) by mouth daily.     loratadine 10 MG tablet  Commonly known as:  CLARITIN  Take 10 mg by mouth daily as needed for allergies.     NITROSTAT 0.4 MG SL tablet  Generic drug:   nitroGLYCERIN  PLACE 1 TABLET UNDER THE TONGUE EVERY 5 MINUTES AS NEEDED FOR CHEST PAIN     omeprazole 20 MG capsule  Commonly known as:  PRILOSEC  Take 20 mg by mouth daily.     PRESERVISION/LUTEIN Caps  Take 1 capsule by mouth daily.     RA MELATONIN 10 MG Tabs  Generic drug:  Melatonin  Take 10 mg by mouth at bedtime.     ranolazine 500 MG 12 hr tablet  Commonly known as:  RANEXA  Take 1 tablet (500 mg total) by mouth 2 (two) times daily.     SYSTANE OP  Place 1 drop into both eyes as directed. 2-4 times a day     VITAMIN D-3 PO  Take 1 tablet by mouth daily.     warfarin 4 MG tablet  Commonly known as:  COUMADIN  Take 0.5 tablets (2 mg total) by mouth daily.  Start taking on:  06/09/2014         Brief H and P: For complete details please refer to admission H and P, but in brief Patient is a 81 year old male with paroxysmal A. fib, chronic left systolic CHF, CAD, hypertension, diabetes, colon cancer, myelodysplastic  syndrome presented to ED with complaints of left-sided chest tightness, increased nausea, weakness for the past 4 days. Otherwise denied any fevers, chills, shortness of breath, vomiting or diarrhea. Patient was admitted for further workup. Patient is on Coumadin for atrial fibrillation, INR was 4.08.  Hospital Course:  Chest pain : Risk factors diabetes, hypertension, hyperlipidemia, prior history of CAD: - Prior cardiac cath (Dr Martinique) in 4/15 had shown widely patent stent in LAD, 30% disease in mid and distal vessel, first diagonal small and 80% stenosis proximally, focal 60% in the ostium of the PDA, EF 55-65% . Recommended medical therapy. - 2-D echo in 07/2011 showed EF of 35-40%, mild to moderate aortic stenosis  - Cardiology consulted.Trop 0.04-> 0.03, ekg with NSR, PVC's, RBBB - Continue aspirin, Coreg, statin, Imdur, lisinopril, ranexa. Patient was seen by Dr Sallyanne Kuster who did not feel the chest tightness was usual angina. Patient had no chest pain  since admission. Due to symptomatic orthostasis, imdur was decreased to 90mg  daily (from BID)   Hypertension - Continue Coreg, Imdur    Diabetes mellitus - Continue  Amaryl and tradjenta   Coronary artery disease:refer to #1    Atrial fibrillation - Currently heart rate controlled, continue Coreg, Coumadin was placed on hold due to supratherapeutic INR. Patient was recommended to hold coumadin again tonight. Mat restart at half dose 2mg  tomorrow and recheck PT/INR on Monday    Chronic systolic CHF (congestive heart failure) - EF normal on cardiac cath, not on any diuretics prior to admission    Weakness generalized - feeling better, UA negative for UTI     Day of Discharge BP 128/64 mmHg  Pulse 76  Temp(Src) 97.8 F (36.6 C) (Oral)  Resp 20  Ht 5\' 9"  (1.753 m)  Wt 66.543 kg (146 lb 11.2 oz)  BMI 21.65 kg/m2  SpO2 97%  Physical Exam: General: Alert and awake oriented x3 not in any acute distress. HEENT: anicteric sclera, pupils reactive to light and accommodation CVS: ireg ireg, no MRG Chest: clear to auscultation bilaterally, no wheezing rales or rhonchi Abdomen: soft nontender, nondistended, normal bowel sounds Extremities: no cyanosis, clubbing or edema noted bilaterally Neuro: Cranial nerves II-XII intact, no focal neurological deficits   The results of significant diagnostics from this hospitalization (including imaging, microbiology, ancillary and laboratory) are listed below for reference.    LAB RESULTS: Basic Metabolic Panel:  Recent Labs Lab 06/07/14 1803 06/08/14 0455  NA 135 134*  K 3.9 3.5  CL 104 107  CO2 22 25  GLUCOSE 169* 78  BUN 15 11  CREATININE 1.05 1.15  CALCIUM 8.6 8.4   Liver Function Tests:  Recent Labs Lab 06/07/14 2127  AST 35  ALT 27  ALKPHOS 40  BILITOT 0.6  PROT 5.9*  ALBUMIN 3.0*   No results for input(s): LIPASE, AMYLASE in the last 168 hours. No results for input(s): AMMONIA in the last 168  hours. CBC:  Recent Labs Lab 06/07/14 1803 06/08/14 0455  WBC 3.7* 4.2  HGB 12.8* 12.2*  HCT 36.6* 35.8*  MCV 98.1 101.4*  PLT 164 154   Cardiac Enzymes:  Recent Labs Lab 06/08/14 0455 06/08/14 1115  TROPONINI 0.03 0.03   BNP: Invalid input(s): POCBNP CBG:  Recent Labs Lab 06/08/14 0608 06/08/14 1148  GLUCAP 77 202*    Significant Diagnostic Studies:  Dg Chest 2 View  06/07/2014   CLINICAL DATA:  Increasing weakness for 3 days.  EXAM: CHEST  2 VIEW  COMPARISON:  08/27/2013  FINDINGS:  There is moderate hyperinflation, unchanged. There is extensive coarsening of the interstitium, likely fibrotic. This is not significantly changed. No superimposed acute airspace consolidation or edema is evident. There are no effusions. Hilar, mediastinal and cardiac contours are unremarkable and unchanged.  IMPRESSION: Extensive fibrotic appearing interstitial coarsening. No acute findings are evident.   Electronically Signed   By: Andreas Newport M.D.   On: 06/07/2014 19:11    2D ECHO:   Disposition and Follow-up: Discharge Instructions    Diet Carb Modified    Complete by:  As directed      Discharge instructions    Complete by:  As directed   Please check PT/INR on Monday 06/10/14. Please discuss with your MD if warfarin dose needs to be adjusted. Hold coumadin tonight. You can restart tomorrow 2mg  then check INR on Monday.     Increase activity slowly    Complete by:  As directed             DISPOSITION:home   DISCHARGE FOLLOW-UP Follow-up Information    Follow up with Irven Shelling, MD. Schedule an appointment as soon as possible for a visit in 1 week.   Specialty:  Internal Medicine   Why:  for hospital follow-up   Contact information:   [address], [address] [place] [address] [phone]       Follow up with Nahser, Wonda Cheng, MD. Schedule an appointment as soon as possible for a visit in 10 days.   Specialty:  Cardiology   Why:  for  hospital follow-up. Please get PT/INR check on monday 2/1   Contact information:   [address] [place] [address] [phone]        Time spent on Discharge: 35 mins  Signed:   Meggin Ola M.D. [facility] 06/08/2014, 3:34 PM Pager: [phone]

## 2014-06-08 NOTE — Progress Notes (Signed)
UR completed 

## 2014-06-09 LAB — URINE CULTURE: Colony Count: 7000

## 2014-06-18 ENCOUNTER — Encounter: Payer: Self-pay | Admitting: Oncology

## 2014-06-18 ENCOUNTER — Ambulatory Visit (INDEPENDENT_AMBULATORY_CARE_PROVIDER_SITE_OTHER): Payer: Medicare Other | Admitting: Oncology

## 2014-06-18 VITALS — BP 135/62 | HR 75 | Temp 97.3°F | Ht 69.0 in | Wt 157.1 lb

## 2014-06-18 DIAGNOSIS — D61818 Other pancytopenia: Secondary | ICD-10-CM | POA: Insufficient documentation

## 2014-06-18 DIAGNOSIS — C189 Malignant neoplasm of colon, unspecified: Secondary | ICD-10-CM

## 2014-06-18 DIAGNOSIS — C772 Secondary and unspecified malignant neoplasm of intra-abdominal lymph nodes: Secondary | ICD-10-CM

## 2014-06-18 LAB — CBC WITH DIFFERENTIAL/PLATELET
Basophils Absolute: 0.1 10*3/uL (ref 0.0–0.1)
Basophils Relative: 1 % (ref 0–1)
EOS PCT: 2 % (ref 0–5)
Eosinophils Absolute: 0.1 10*3/uL (ref 0.0–0.7)
HEMATOCRIT: 36.6 % — AB (ref 39.0–52.0)
HEMOGLOBIN: 12.2 g/dL — AB (ref 13.0–17.0)
LYMPHS ABS: 0.9 10*3/uL (ref 0.7–4.0)
LYMPHS PCT: 15 % (ref 12–46)
MCH: 34.2 pg — AB (ref 26.0–34.0)
MCHC: 33.3 g/dL (ref 30.0–36.0)
MCV: 102.5 fL — ABNORMAL HIGH (ref 78.0–100.0)
MPV: 10.6 fL (ref 8.6–12.4)
Monocytes Absolute: 0.5 10*3/uL (ref 0.1–1.0)
Monocytes Relative: 9 % (ref 3–12)
Neutro Abs: 4.3 10*3/uL (ref 1.7–7.7)
Neutrophils Relative %: 73 % (ref 43–77)
Platelets: 187 10*3/uL (ref 150–400)
RBC: 3.57 MIL/uL — ABNORMAL LOW (ref 4.22–5.81)
RDW: 13.8 % (ref 11.5–15.5)
WBC: 5.9 10*3/uL (ref 4.0–10.5)

## 2014-06-18 LAB — SAVE SMEAR

## 2014-06-18 NOTE — Patient Instructions (Signed)
To lab today Return 1 year lab 1 week before visit

## 2014-06-19 ENCOUNTER — Telehealth: Payer: Self-pay | Admitting: *Deleted

## 2014-06-19 NOTE — Progress Notes (Signed)
Patient ID: [name], male   DOB: 1924/01/03, 81 y.o.   MRN: [id number] Hematology and Oncology Follow Up Visit  [name] [id number] 09-07-1923 81 y.o. 06/19/2014 9:20 AM   Principle Diagnosis: Encounter Diagnoses  Name Primary?  . 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 Other pancytopenia Yes   clinical summary:  81 year old man with history of colon cancer with initial stage III disease diagnosed in October 1992. Status post sigmoid colectomy followed by adjuvant chemotherapy with 5-fluorouracil. He had a retroperitoneal recurrence in July of 1993 treated aggressively with surgery, radiation, and chemotherapy. He achieved a durable remission and is likely cured.   Over the years he has developed a mild macrocytic anemia since 2007 with normal vitamin studies and normal serum immunoglobulins with IFE. He likely has a low grade myelodysplastic syndrome. I never did a bone marrow biopsy since hemoglobins have been stable at 12-13 g.    Interim History:   He is now referred back when a recent CBC showed a minor decrease in his white count compared with his baseline. There is a persistent mild anemia and fluctuating mild thrombocytopenia which are unchanged from his baseline. Hemoglobin 12.6, hematocrit 38%, MCV 106, white count 3900 with 73% neutrophils, 12% lymphocytes, 11% monocytes, 3 is in a fills, and platelet count 146,000, recorded on 04/25/2014. Repeat blood count in my office today 06/18/2014 shows white count back in the normal range 5900, 73% neutrophils, 15 lymphocytes, 9 monocytes, hemoglobin at his chronic baseline 12.2 g, and platelet count 187,000. Reviewing blood counts in my records over the last 5 years shows that platelet count has fallen transiently over the years but no lower than 123,000. Hemoglobins have been stable in the 12-13 gram range. He does have chronic macrocytosis. Lowest total white count on record from 06/07/2014 was 3700.  He  remains asymptomatic. He is doing very well overall. He has had some cardiac issues over the last few years. A April 2015 admission for recurrent chest pain. Cardiac catheterization done at that time and showed that previous coronary stents placed in March 2012 remained patent. He has developed a atrial fibrillation and is now on low-dose warfarin anticoagulation. Most recent echocardiogram showed ejection fraction 35-40 percent and moderate aortic stenosis. He has mild, chronic, renal insufficiency with estimated GFR 60-70. He had a recent colonoscopy on 05/22/2014 by Dr. Teena Irani to evaluate blood in his stools. This was normal  except for single benign polyp that was removed from the transverse colon. Recent medication changes include the addition of Ranexa and Donepezil in November 2015.  Medications: reviewed  Allergies:  Allergies  Allergen Reactions  . Metformin And Related Nausea And Vomiting    Review of Systems: See history of present illness:  Remaining ROS negative:   Physical Exam: Blood pressure 135/62, pulse 75, temperature 97.3 F (36.3 C), temperature source Oral, height $RemoveBefo'5\' 9"'XPycdgoErQp$  (1.753 m), weight 157 lb 1.6 oz (71.26 kg), SpO2 97 %. Wt Readings from Last 3 Encounters:  06/18/14 157 lb 1.6 oz (71.26 kg)  06/08/14 146 lb 11.2 oz (66.543 kg)  04/09/14 163 lb (73.936 kg)     General appearance: Thin but well-nourished Caucasian man HENNT: Pharynx no erythema, exudate, mass, or ulcer. No thyromegaly or thyroid nodules Lymph nodes: No cervical, supraclavicular, or axillary lymphadenopathy Breasts: No abnormal skin changes, no dominant mass in either breast Lungs: Clear to auscultation, resonant to percussion throughout Heart: Regular rhythm, no murmur, no gallop, no rub, no click,  no edema Abdomen: Soft, nontender, normal bowel sounds, no mass, no organomegaly Extremities: No edema, no calf tenderness Musculoskeletal: no joint deformities GU:  Vascular: Carotid pulses  2+, no bruits,  Neurologic: Alert, oriented, PERRLA, , cranial nerves grossly normal, motor strength 5 over 5, reflexes 1+ symmetric, upper body coordination normal, gait normal, Skin: No rash or ecchymosis  Lab Results: CBC W/Diff    Component Value Date/Time   WBC 5.9 06/18/2014 1416   WBC 6.0 06/15/2013 1557   RBC 3.57* 06/18/2014 1416   RBC 3.63* 06/15/2013 1557   HGB 12.2* 06/18/2014 1416   HGB 12.8* 06/15/2013 1557   HCT 36.6* 06/18/2014 1416   HCT 37.6* 06/15/2013 1557   PLT 187 06/18/2014 1416   PLT 141 06/15/2013 1557   MCV 102.5* 06/18/2014 1416   MCV 103.7* 06/15/2013 1557   MCH 34.2* 06/18/2014 1416   MCH 35.2* 06/15/2013 1557   MCHC 33.3 06/18/2014 1416   MCHC 34.0 06/15/2013 1557   RDW 13.8 06/18/2014 1416   RDW 13.7 06/15/2013 1557   LYMPHSABS 0.9 06/18/2014 1416   LYMPHSABS 1.0 06/15/2013 1557   MONOABS 0.5 06/18/2014 1416   MONOABS 0.6 06/15/2013 1557   EOSABS 0.1 06/18/2014 1416   EOSABS 0.2 06/15/2013 1557   BASOSABS 0.1 06/18/2014 1416   BASOSABS 0.0 06/15/2013 1557     Chemistry      Component Value Date/Time   NA 134* 06/08/2014 0455   NA 140 06/15/2013 1557   K 3.5 06/08/2014 0455   K 4.3 06/15/2013 1557   CL 107 06/08/2014 0455   CO2 25 06/08/2014 0455   CO2 25 06/15/2013 1557   BUN 11 06/08/2014 0455   BUN 21.2 06/15/2013 1557   CREATININE 1.15 06/08/2014 0455   CREATININE 1.1 06/15/2013 1557   CREATININE 1.44 07/28/2010 1745   CREATININE 1.54* 06/22/2010 1053      Component Value Date/Time   CALCIUM 8.4 06/08/2014 0455   CALCIUM 9.4 06/15/2013 1557   ALKPHOS 40 06/07/2014 2127   ALKPHOS 48 06/15/2013 1557   AST 35 06/07/2014 2127   AST 22 06/15/2013 1557   ALT 27 06/07/2014 2127   ALT 18 06/15/2013 1557   BILITOT 0.6 06/07/2014 2127   BILITOT 0.37 06/15/2013 1557       Radiological Studies: Dg Chest 2 View  06/07/2014   CLINICAL DATA:  Increasing weakness for 3 days.  EXAM: CHEST  2 VIEW  COMPARISON:  08/27/2013  FINDINGS:  There is moderate hyperinflation, unchanged. There is extensive coarsening of the interstitium, likely fibrotic. This is not significantly changed. No superimposed acute airspace consolidation or edema is evident. There are no effusions. Hilar, mediastinal and cardiac contours are unremarkable and unchanged.  IMPRESSION: Extensive fibrotic appearing interstitial coarsening. No acute findings are evident.   Electronically Signed   By: Andreas Newport M.D.   On: 06/07/2014 19:11    Impression:  #1. Chronic, minimal, anemia with macrocytosis with occasional decrease in his total white count and platelet count unchanged from his baseline and likely related to previous effects of chemotherapy and radiation on bone marrow function. There is no suspicion for any progressive bone marrow disease and no indication for any further evaluation at this time.  #2. Relapsed colon cancer out 21 years from recurrence and treatment and likely cured. Note recent January 2016 colonoscopy negative except for a single benign polyp.   CC: Patient Care Team: Irven Shelling, MD as PCP - General (Internal Medicine)   Annia Belt, MD  2/10/20169:20 AM

## 2014-06-19 NOTE — Telephone Encounter (Signed)
Pt called / informed blood counts are good; "no worries" per Dr Beryle Beams. He said Thank-You.

## 2014-06-19 NOTE — Telephone Encounter (Signed)
-----   Message from Annia Belt, MD sent at 06/19/2014 11:08 AM EST ----- Call pt blood counts good - no worries

## 2014-06-21 ENCOUNTER — Encounter: Payer: Medicare Other | Admitting: Physician Assistant

## 2014-06-27 ENCOUNTER — Encounter: Payer: Self-pay | Admitting: Cardiovascular Disease

## 2014-06-27 ENCOUNTER — Ambulatory Visit (INDEPENDENT_AMBULATORY_CARE_PROVIDER_SITE_OTHER): Payer: Medicare Other | Admitting: Cardiovascular Disease

## 2014-06-27 VITALS — BP 146/70 | HR 55 | Ht 69.0 in | Wt 156.0 lb

## 2014-06-27 DIAGNOSIS — I480 Paroxysmal atrial fibrillation: Secondary | ICD-10-CM

## 2014-06-27 DIAGNOSIS — I5022 Chronic systolic (congestive) heart failure: Secondary | ICD-10-CM

## 2014-06-27 DIAGNOSIS — I2510 Atherosclerotic heart disease of native coronary artery without angina pectoris: Secondary | ICD-10-CM

## 2014-06-27 MED ORDER — NITROGLYCERIN 0.4 MG SL SUBL: 0.4000 mg | SUBLINGUAL_TABLET | SUBLINGUAL | Status: DC | PRN

## 2014-06-27 NOTE — Patient Instructions (Signed)
Your physician recommends that you continue on your current medications as directed. Please refer to the Current Medication list given to you today.  Your physician wants you to follow-up in: 6 months with Dr. Nahser.  You will receive a reminder letter in the mail two months in advance. If you don't receive a letter, please call our office to schedule the follow-up appointment.  

## 2014-06-27 NOTE — Progress Notes (Signed)
Cardiology Office Note   Date:  06/27/2014   ID:  [name], DOB [birth year], MRN [id number]  PCP:  Irven Shelling, MD  Cardiologist:   Thayer Headings, MD   Chief Complaint  Patient presents with  . Coronary Artery Disease   1. CAD - s/p stenting July 2012.  2. Hypertension  3. Hyperlipidemia  4. Diabetes Mellitus    History of Present Illness:  [name] is seen today for a 2 week check. He has known CAD with prior PCI to the LAD last year. Other issues include chronic PVC's and PACs, HTN, DM, HLD and systolic heart failure. He now has atrial fib and will be managed with rate control and anticoagulation. He had a failed attempt at [place] in March due to inability to pass the probe into the esophagus. His EF is 35 to 40% with mild to moderate AS.  His INR has been therapeutic. He really is not interested in doing cardioversion.  December 23, 2011 - he was initially seen in the emergency room. He has progressive back pain associated with increasing shortness of breath. [name] noticed that if he takes a nitroglycerin at this back pain and his shortness of breath improved. He has these discomforts with minor levels of exertion. He is also having at rest.  March 20, 2012: [name] presented with some episodes of chest pain when I last saw him in August. He had a stress Myoview study that was normal. He still walks almost every day - he goes around [employer] 1-1 1/2 miles a day. He had a brief episode of CP yesterday - relieved with NTG. He had some food poisoning recently ( raw oysters)  Oct 03, 2012:  He has been having some mid abdominal pain with radiation around to the back. He took a NTG with relief. The pain was a dull pain and would last 30 minutes or so. He has been putting in his garden without any without any problems.   Oct. 1, 2014:  [name] is doing ok. He is having problems with his memory. Has trouble getting the words out sometimes. No  CVA symptoms. No focal neuro findings.  No  CP or dyspnea. 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   August 10, 2013:  [name] has been having some chest tightness . Occurs 1-2 times a week. The chest tightness is relieved if he takes a nitroglycerin. he also crushes up a full strength aspirin and takes that. The pains occur with rest. Not associated with eating Or drinking anything. Similar to his previous epidoses of CP but are not as severe.  December 24, 2013:  [name] has been hospitalized since I last saw him. He had some chest pain and a followup cardiac catheterization revealed that his stents were okay. [name] been on Ranexa but complains of the cost is a bit high. The Ranexa seems to be helping quite a bit.    Feb. 18, 2016:    [name] is a 81 y.o. male who presents for follow up of his CAD Fells great.  Has been feeling better on the Ranexa.  Wants to cut the Ranexa to every other day.   Enjoying life.  Not exercising much these days.  Gets his INR levels drawn at his primary medical doctors office.    Past Medical History  Diagnosis Date  . Coronary artery disease     a. s/p PCI w/ DES to mLAD  08/05/10. b. NSTEMI 08/2013 (mildly elev trop) - cath showing widely patent stent, 80% prox D1 (small vessel) with recommendation for medical management, residual 60% ostial PDA, <20% LCx, LVEF 55-65%.   . Hypertension   . Hyperlipidemia   . Barrett esophagus   . Gallstone pancreatitis 2011    a. s/p chole.  . SBO (small bowel obstruction) 2005  . Macrocytic anemia 06/25/2011  . PAF (paroxysmal atrial fibrillation)     a. failed TEE due to inability to pass probe into the esophagus in March 2013. b. On Coumadin. Was in NSR 08/2013 admission.  . Chronic anticoagulation   . Depression   . GERD (gastroesophageal reflux disease)   . Colon cancer     a. 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 b.  "graduated" from  their practice 06/2013, remains free of any new disease per those notes.  . Type II diabetes mellitus   . Premature atrial contractions   . Premature ventricular contractions   . Aortic stenosis     a. Mild-mod by echo 2013.  [name] Chronic systolic CHF (congestive heart failure)     a. EF previously 35-40%. b. improved to 55-65% by cath 08/2013.  . Myelodysplastic disease     a. Suspected low-grade  myelodysplastic disease per onc notes.  . Other pancytopenia 06/18/2014    Mild, fluctuating, likely med related. Rule out early MDS from prior chemo/RT or colon cancer    Past Surgical History  Procedure Laterality Date  . Cholecystectomy  2011  . Right colectomy    . Coronary angioplasty with stent placement  08/05/10    DES to the LAD  . Cardiac catheterization  08/03/10  . Cardiac catheterization  08/09/10    LAD 30, stent OK, CFX 40, RCA < 20  . Abdominal mass resection      Mass near mesentery  . Cystoscopy      "with removal of kidney stone in office"  . Cataract extraction Bilateral   . Appendectomy      "took out during colon surgery"  . Cystoscopy with retrograde pyelogram, ureteroscopy and stent placement Bilateral 05/18/2013    Procedure: CYSTOSCOPY WITH BILATERAL RETROGRADE PYELOGRAM, LEFT URETEROSCOPY AND LEFT STENT PLACEMENT;  Surgeon: Alexis Frock, MD;  Location: [facility];  Service: Urology;  Laterality: Bilateral;  . Colon surgery    . Tonsillectomy    . Left heart catheterization with coronary angiogram N/A 08/15/2013    Procedure: LEFT HEART CATHETERIZATION WITH CORONARY ANGIOGRAM;  Surgeon: Peter M Martinique, MD;  Location: [facility] CATH LAB;  Service: Cardiovascular;  Laterality: N/A;     Current Outpatient Prescriptions  Medication Sig Dispense Refill  . aspirin EC 81 MG tablet Take 81 mg by mouth daily.    [name] atorvastatin (LIPITOR) 40 MG tablet Take 1 tablet (40 mg total) by mouth daily at 6 PM. 90 tablet 3  . Cholecalciferol (VITAMIN D-3 PO) Take 1 tablet by mouth daily.     [name]  donepezil (ARICEPT) 5 MG tablet Take 5 mg by mouth at bedtime.     . finasteride (PROSCAR) 5 MG tablet Take 5 mg by mouth at bedtime.     [name] glimepiride (AMARYL) 4 MG tablet Take 4 mg by mouth daily after breakfast.     . isosorbide mononitrate (IMDUR) 60 MG 24 hr tablet Take 1.5 tablets (90 mg total) by mouth daily. 90 tablet 3  . linagliptin (TRADJENTA) 5 MG TABS tablet Take 5 mg by mouth daily as needed. For  blood sugar    . lisinopril (PRINIVIL,ZESTRIL) 10 MG tablet Take 1 tablet (10 mg total) by mouth daily. 90 tablet 3  . loratadine (CLARITIN) 10 MG tablet Take 10 mg by mouth daily as needed for allergies.    . Melatonin (RA MELATONIN) 10 MG TABS Take 10 mg by mouth at bedtime.     . metoprolol succinate (TOPROL-XL) 100 MG 24 hr tablet Take 100 mg by mouth 2 (two) times daily at 10 AM and 5 PM. Take with or immediately following a meal.    . Multiple Vitamins-Minerals (PRESERVISION/LUTEIN) CAPS Take 1 capsule by mouth daily.   0  . NITROSTAT 0.4 MG SL tablet PLACE 1 TABLET UNDER THE TONGUE EVERY 5 MINUTES AS NEEDED FOR CHEST PAIN 25 tablet 5  . omeprazole (PRILOSEC) 20 MG capsule Take 20 mg by mouth daily.    Vladimir Faster Glycol-Propyl Glycol (SYSTANE OP) Place 1 drop into both eyes as directed. 2-4 times a day    . ranolazine (RANEXA) 500 MG 12 hr tablet Take 1 tablet (500 mg total) by mouth 2 (two) times daily. 60 tablet 5  . warfarin (COUMADIN) 4 MG tablet Take 0.5 tablets (2 mg total) by mouth daily.     No current facility-administered medications for this visit.    Allergies:   Metformin and related    Social History:  The patient  reports that he quit smoking about 20 years ago. His smoking use included Cigarettes. He has a 30 pack-year smoking history. He has never used smokeless tobacco. He reports that he drinks about 1.2 oz of alcohol per week. He reports that he does not use illicit drugs.   Family History:  The patient's family history includes Cancer in his mother; Heart  attack in his brother; Ulcers in his father.    ROS:  Please see the history of present illness.    Review of Systems: Constitutional:  denies fever, chills, diaphoresis, appetite change and fatigue.  HEENT: denies photophobia, eye pain, redness, hearing loss, ear pain, congestion, sore throat, rhinorrhea, sneezing, neck pain, neck stiffness and tinnitus.  Respiratory: denies SOB, DOE, cough, chest tightness, and wheezing.  Cardiovascular: denies chest pain, palpitations and leg swelling.  Gastrointestinal: denies nausea, vomiting, abdominal pain, diarrhea, constipation, blood in stool.  Genitourinary: denies dysuria, urgency, frequency, hematuria, flank pain and difficulty urinating.  Musculoskeletal: denies  myalgias, back pain, joint swelling, arthralgias and gait problem.   Skin: denies pallor, rash and wound.  Neurological: denies dizziness, seizures, syncope, weakness, light-headedness, numbness and headaches.   Hematological: denies adenopathy, easy bruising, personal or family bleeding history.  Psychiatric/ Behavioral: denies suicidal ideation, mood changes, confusion, nervousness, sleep disturbance and agitation.       All other systems are reviewed and negative.    PHYSICAL EXAM: VS:  BP 146/70 mmHg  Pulse 55  Ht 5\' 9"  (1.753 m)  Wt 156 lb (70.761 kg)  BMI 23.03 kg/m2  SpO2 97% , BMI Body mass index is 23.03 kg/(m^2). GEN: Well nourished, well developed, in no acute distress HEENT: normal Neck: no JVD, carotid bruits, or masses Cardiac: RRR; no murmurs, rubs, or gallops,no edema  Respiratory:  clear to auscultation bilaterally, normal work of breathing GI: soft, nontender, nondistended, + BS MS: no deformity or atrophy Skin: warm and dry, no rash Neuro:  Strength and sensation are intact Psych: normal   EKG:  EKG is not ordered today.    Recent Labs: 08/28/2013: Magnesium 2.0 06/07/2014: ALT 27 06/08/2014: BUN 11;  Creatinine 1.15; Potassium 3.5; Sodium 134*;  TSH 1.831 06/18/2014: Hemoglobin 12.2*; Platelets 187    Lipid Panel    Component Value Date/Time   CHOL 119 08/07/2013 0946   TRIG 50.0 08/07/2013 0946   HDL 39.50 08/07/2013 0946   CHOLHDL 3 08/07/2013 0946   VLDL 10.0 08/07/2013 0946   LDLCALC 70 08/07/2013 0946      Wt Readings from Last 3 Encounters:  06/27/14 156 lb (70.761 kg)  06/18/14 157 lb 1.6 oz (71.26 kg)  06/08/14 146 lb 11.2 oz (66.543 kg)      Other studies Reviewed: Additional studies/ records that were reviewed today include: . Review of the above records demonstrates:    ASSESSMENT AND PLAN:  1. CAD - s/p stenting July 2012.  - he was recently admitted to the hospital with some atypical chest pain. He was started on Ranexa and has done very well. [name] not had any further episodes of pain.  2. Hypertension - pressure is well-controlled.  3. Hyperlipidemia - we'll check fasting lipids at his next office visit.  4. Diabetes Mellitus    Current medicines are reviewed at length with the patient today.  The patient does not have concerns regarding medicines.  The following changes have been made:  no change   Disposition:   FU with me in 6 months     Signed, Nahser, Wonda Cheng, MD  06/27/2014 3:08 PM    [facility] [address], [place], [place]  [address] Phone: [phone]; Fax: [phone]

## 2014-07-08 LAB — PROTIME-INR

## 2014-07-19 ENCOUNTER — Other Ambulatory Visit: Payer: Medicare Other

## 2014-07-19 ENCOUNTER — Ambulatory Visit: Payer: Medicare Other | Admitting: Cardiovascular Disease

## 2014-09-11 ENCOUNTER — Encounter (HOSPITAL_COMMUNITY): Payer: Self-pay

## 2014-09-11 ENCOUNTER — Inpatient Hospital Stay (HOSPITAL_COMMUNITY)
Admission: EM | Admit: 2014-09-11 | Discharge: 2014-09-13 | DRG: 378 | Disposition: A | Discharge status: Home / Self Care | Payer: Medicare Other | Attending: Internal Medicine | Admitting: Internal Medicine

## 2014-09-11 ENCOUNTER — Emergency Department (HOSPITAL_COMMUNITY): Payer: Medicare Other

## 2014-09-11 DIAGNOSIS — Z7901 Long term (current) use of anticoagulants: Secondary | ICD-10-CM | POA: Diagnosis not present

## 2014-09-11 DIAGNOSIS — K5791 Diverticulosis of intestine, part unspecified, without perforation or abscess with bleeding: Secondary | ICD-10-CM | POA: Diagnosis present

## 2014-09-11 DIAGNOSIS — K644 Residual hemorrhoidal skin tags: Secondary | ICD-10-CM | POA: Diagnosis present

## 2014-09-11 DIAGNOSIS — I2510 Atherosclerotic heart disease of native coronary artery without angina pectoris: Secondary | ICD-10-CM | POA: Diagnosis present

## 2014-09-11 DIAGNOSIS — I350 Nonrheumatic aortic (valve) stenosis: Secondary | ICD-10-CM | POA: Diagnosis present

## 2014-09-11 DIAGNOSIS — I5022 Chronic systolic (congestive) heart failure: Secondary | ICD-10-CM | POA: Diagnosis present

## 2014-09-11 DIAGNOSIS — I252 Old myocardial infarction: Secondary | ICD-10-CM

## 2014-09-11 DIAGNOSIS — E119 Type 2 diabetes mellitus without complications: Secondary | ICD-10-CM | POA: Diagnosis present

## 2014-09-11 DIAGNOSIS — D469 Myelodysplastic syndrome, unspecified: Secondary | ICD-10-CM | POA: Diagnosis present

## 2014-09-11 DIAGNOSIS — D62 Acute posthemorrhagic anemia: Secondary | ICD-10-CM | POA: Diagnosis present

## 2014-09-11 DIAGNOSIS — I480 Paroxysmal atrial fibrillation: Secondary | ICD-10-CM | POA: Diagnosis present

## 2014-09-11 DIAGNOSIS — K922 Gastrointestinal hemorrhage, unspecified: Secondary | ICD-10-CM | POA: Diagnosis not present

## 2014-09-11 DIAGNOSIS — K625 Hemorrhage of anus and rectum: Secondary | ICD-10-CM | POA: Diagnosis present

## 2014-09-11 DIAGNOSIS — K219 Gastro-esophageal reflux disease without esophagitis: Secondary | ICD-10-CM | POA: Diagnosis present

## 2014-09-11 DIAGNOSIS — Z85038 Personal history of other malignant neoplasm of large intestine: Secondary | ICD-10-CM | POA: Diagnosis not present

## 2014-09-11 DIAGNOSIS — Z955 Presence of coronary angioplasty implant and graft: Secondary | ICD-10-CM

## 2014-09-11 DIAGNOSIS — E785 Hyperlipidemia, unspecified: Secondary | ICD-10-CM | POA: Diagnosis present

## 2014-09-11 DIAGNOSIS — I10 Essential (primary) hypertension: Secondary | ICD-10-CM | POA: Diagnosis present

## 2014-09-11 DIAGNOSIS — Z87891 Personal history of nicotine dependence: Secondary | ICD-10-CM | POA: Diagnosis not present

## 2014-09-11 DIAGNOSIS — K648 Other hemorrhoids: Secondary | ICD-10-CM | POA: Diagnosis present

## 2014-09-11 LAB — CBC WITH DIFFERENTIAL/PLATELET
BASOS ABS: 0 10*3/uL (ref 0.0–0.1)
Basophils Relative: 0 % (ref 0–1)
Eosinophils Absolute: 0.1 10*3/uL (ref 0.0–0.7)
Eosinophils Relative: 2 % (ref 0–5)
HEMATOCRIT: 33.3 % — AB (ref 39.0–52.0)
Hemoglobin: 11.4 g/dL — ABNORMAL LOW (ref 13.0–17.0)
LYMPHS PCT: 11 % — AB (ref 12–46)
Lymphs Abs: 0.7 10*3/uL (ref 0.7–4.0)
MCH: 34.5 pg — AB (ref 26.0–34.0)
MCHC: 34.2 g/dL (ref 30.0–36.0)
MCV: 100.9 fL — AB (ref 78.0–100.0)
MONO ABS: 0.7 10*3/uL (ref 0.1–1.0)
Monocytes Relative: 11 % (ref 3–12)
Neutro Abs: 4.8 10*3/uL (ref 1.7–7.7)
Neutrophils Relative %: 76 % (ref 43–77)
PLATELETS: 152 10*3/uL (ref 150–400)
RBC: 3.3 MIL/uL — ABNORMAL LOW (ref 4.22–5.81)
RDW: 13.2 % (ref 11.5–15.5)
WBC: 6.3 10*3/uL (ref 4.0–10.5)

## 2014-09-11 LAB — ABO/RH: ABO/RH(D): A POS

## 2014-09-11 LAB — CBC
HCT: 25.6 % — ABNORMAL LOW (ref 39.0–52.0)
HCT: 27 % — ABNORMAL LOW (ref 39.0–52.0)
HEMOGLOBIN: 9.8 g/dL — AB (ref 13.0–17.0)
HEMOGLOBIN: 8.9 g/dL — AB (ref 13.0–17.0)
MCH: 35 pg — ABNORMAL HIGH (ref 26.0–34.0)
MCH: 36.8 pg — ABNORMAL HIGH (ref 26.0–34.0)
MCHC: 36.3 g/dL — AB (ref 30.0–36.0)
MCHC: 34.8 g/dL (ref 30.0–36.0)
MCV: 100.8 fL — ABNORMAL HIGH (ref 78.0–100.0)
MCV: 101.5 fL — ABNORMAL HIGH (ref 78.0–100.0)
PLATELETS: 132 10*3/uL — AB (ref 150–400)
Platelets: 131 10*3/uL — ABNORMAL LOW (ref 150–400)
RBC: 2.54 MIL/uL — ABNORMAL LOW (ref 4.22–5.81)
RBC: 2.66 MIL/uL — ABNORMAL LOW (ref 4.22–5.81)
RDW: 13.3 % (ref 11.5–15.5)
RDW: 13.4 % (ref 11.5–15.5)
WBC: 3.4 10*3/uL — ABNORMAL LOW (ref 4.0–10.5)
WBC: 3.4 10*3/uL — ABNORMAL LOW (ref 4.0–10.5)

## 2014-09-11 LAB — I-STAT CHEM 8, ED
BUN: 16 mg/dL (ref 6–20)
CALCIUM ION: 1.2 mmol/L (ref 1.13–1.30)
CREATININE: 1.3 mg/dL — AB (ref 0.61–1.24)
Chloride: 102 mmol/L (ref 101–111)
Glucose, Bld: 113 mg/dL — ABNORMAL HIGH (ref 70–99)
HEMATOCRIT: 35 % — AB (ref 39.0–52.0)
HEMOGLOBIN: 11.9 g/dL — AB (ref 13.0–17.0)
POTASSIUM: 3.8 mmol/L (ref 3.5–5.1)
Sodium: 138 mmol/L (ref 135–145)
TCO2: 20 mmol/L (ref 0–100)

## 2014-09-11 LAB — PROTIME-INR
INR: 2.22 — ABNORMAL HIGH (ref 0.00–1.49)
INR: 1.91 — ABNORMAL HIGH (ref 0.00–1.49)
PROTHROMBIN TIME: 24.8 s — AB (ref 11.6–15.2)
PROTHROMBIN TIME: 22 s — AB (ref 11.6–15.2)

## 2014-09-11 LAB — CLOSTRIDIUM DIFFICILE BY PCR: Toxigenic C. Difficile by PCR: NEGATIVE

## 2014-09-11 LAB — GLUCOSE, CAPILLARY
GLUCOSE-CAPILLARY: 152 mg/dL — AB (ref 70–99)
Glucose-Capillary: 128 mg/dL — ABNORMAL HIGH (ref 70–99)

## 2014-09-11 LAB — PREPARE RBC (CROSSMATCH)

## 2014-09-11 LAB — POC OCCULT BLOOD, ED: FECAL OCCULT BLD: POSITIVE — AB

## 2014-09-11 MED ORDER — SODIUM CHLORIDE 0.9 % IV BOLUS (SEPSIS)
1000.0000 mL | Freq: Once | INTRAVENOUS | Status: AC
  Administered 2014-09-11: 1000 mL via INTRAVENOUS

## 2014-09-11 MED ORDER — DIPHENHYDRAMINE HCL 50 MG/ML IJ SOLN
25.0000 mg | Freq: Once | INTRAMUSCULAR | Status: AC
  Administered 2014-09-11: 25 mg via INTRAVENOUS
  Filled 2014-09-11: qty 1

## 2014-09-11 MED ORDER — ISOSORBIDE MONONITRATE ER 60 MG PO TB24
90.0000 mg | ORAL_TABLET | Freq: Every day | ORAL | Status: DC
  Administered 2014-09-12 – 2014-09-13 (×2): 90 mg via ORAL
  Filled 2014-09-11 (×4): qty 1

## 2014-09-11 MED ORDER — DONEPEZIL HCL 5 MG PO TABS
5.0000 mg | ORAL_TABLET | Freq: Every day | ORAL | Status: DC
  Administered 2014-09-11 – 2014-09-12 (×2): 5 mg via ORAL
  Filled 2014-09-11 (×2): qty 1

## 2014-09-11 MED ORDER — OCUVITE-LUTEIN PO CAPS
1.0000 | ORAL_CAPSULE | Freq: Every day | ORAL | Status: DC
  Administered 2014-09-11 – 2014-09-13 (×3): 1 via ORAL
  Filled 2014-09-11 (×3): qty 1

## 2014-09-11 MED ORDER — ATORVASTATIN CALCIUM 40 MG PO TABS
40.0000 mg | ORAL_TABLET | Freq: Every day | ORAL | Status: DC
  Administered 2014-09-11 – 2014-09-12 (×2): 40 mg via ORAL
  Filled 2014-09-11 (×2): qty 1

## 2014-09-11 MED ORDER — METOPROLOL SUCCINATE ER 100 MG PO TB24
100.0000 mg | ORAL_TABLET | Freq: Two times a day (BID) | ORAL | Status: DC
  Administered 2014-09-11 – 2014-09-13 (×5): 100 mg via ORAL
  Filled 2014-09-11 (×5): qty 1

## 2014-09-11 MED ORDER — INSULIN ASPART 100 UNIT/ML ~~LOC~~ SOLN
0.0000 [IU] | SUBCUTANEOUS | Status: DC
  Administered 2014-09-11: 2 [IU] via SUBCUTANEOUS
  Administered 2014-09-11: 1 [IU] via SUBCUTANEOUS
  Administered 2014-09-11: 3 [IU] via SUBCUTANEOUS
  Administered 2014-09-12 (×3): 1 [IU] via SUBCUTANEOUS
  Administered 2014-09-13: 3 [IU] via SUBCUTANEOUS

## 2014-09-11 MED ORDER — FUROSEMIDE 10 MG/ML IJ SOLN
20.0000 mg | Freq: Once | INTRAMUSCULAR | Status: AC
  Administered 2014-09-11: 20 mg via INTRAVENOUS
  Filled 2014-09-11: qty 2

## 2014-09-11 MED ORDER — ACETAMINOPHEN 325 MG PO TABS
650.0000 mg | ORAL_TABLET | Freq: Once | ORAL | Status: AC
  Administered 2014-09-11: 650 mg via ORAL
  Filled 2014-09-11: qty 2

## 2014-09-11 MED ORDER — SODIUM CHLORIDE 0.9 % IJ SOLN
3.0000 mL | Freq: Two times a day (BID) | INTRAMUSCULAR | Status: DC
  Administered 2014-09-12: 3 mL via INTRAVENOUS

## 2014-09-11 MED ORDER — LISINOPRIL 10 MG PO TABS
10.0000 mg | ORAL_TABLET | Freq: Every day | ORAL | Status: DC
  Administered 2014-09-11: 10 mg via ORAL
  Filled 2014-09-11: qty 1

## 2014-09-11 MED ORDER — DEXTROSE 5 % IV SOLN
5.0000 mg | Freq: Once | INTRAVENOUS | Status: AC
  Administered 2014-09-11: 5 mg via INTRAVENOUS
  Filled 2014-09-11 (×2): qty 0.5

## 2014-09-11 MED ORDER — SODIUM CHLORIDE 0.9 % IV SOLN
Freq: Once | INTRAVENOUS | Status: AC
  Administered 2014-09-11: 19:00:00 via INTRAVENOUS

## 2014-09-11 MED ORDER — RANOLAZINE ER 500 MG PO TB12
500.0000 mg | ORAL_TABLET | Freq: Two times a day (BID) | ORAL | Status: DC
  Administered 2014-09-11 – 2014-09-13 (×5): 500 mg via ORAL
  Filled 2014-09-11 (×5): qty 1

## 2014-09-11 MED ORDER — POLYVINYL ALCOHOL 1.4 % OP SOLN
1.0000 [drp] | Freq: Four times a day (QID) | OPHTHALMIC | Status: DC | PRN
  Filled 2014-09-11: qty 15

## 2014-09-11 MED ORDER — ISOSORBIDE MONONITRATE ER 60 MG PO TB24
90.0000 mg | ORAL_TABLET | Freq: Every day | ORAL | Status: DC
  Administered 2014-09-11: 90 mg via ORAL
  Filled 2014-09-11 (×2): qty 1

## 2014-09-11 MED ORDER — PANTOPRAZOLE SODIUM 40 MG PO TBEC
40.0000 mg | DELAYED_RELEASE_TABLET | Freq: Every day | ORAL | Status: DC
  Administered 2014-09-11 – 2014-09-13 (×3): 40 mg via ORAL
  Filled 2014-09-11 (×4): qty 1

## 2014-09-11 MED ORDER — FINASTERIDE 5 MG PO TABS
5.0000 mg | ORAL_TABLET | Freq: Every day | ORAL | Status: DC
  Administered 2014-09-11 – 2014-09-12 (×2): 5 mg via ORAL
  Filled 2014-09-11 (×2): qty 1

## 2014-09-11 MED ORDER — LORATADINE 10 MG PO TABS: 10.0000 mg | ORAL_TABLET | Freq: Every day | ORAL | Status: DC | PRN

## 2014-09-11 NOTE — Progress Notes (Signed)
PATIENT DETAILS Name: [name] Age: 81 y.o. Sex: male Date of Birth: [birth year] Admit Date: 09/11/2014 Admitting Physician Etta Quill, DO [id number],ATFT Broadus John, MD  Subjective: Patient is awake oriented.  Appears to be in good spirits.    Assessment/Plan: Principal Problem:  Lower GI bleeding:  Likely diverticular bleed-continues to have a few episodes of hematochezia this am. Hb remains stable. Await repeat HB. On coumadin, INR therapeutic range, given Vit K on admission, await repeat INR. Continue supportive care, if Hb drops significantly, will tranfuse. GI consulted.   Active Problems:   Hypertension:  Well controlled.  Cautiously continue Imdur and Metoprolol- but may need to hold if bleeding gets heavy. Will d/c Lisinopril for now.    Diabetes mellitus:  CBG's stable with SSI-resume Tradjenta and Amaryl on discharge.     Atrial fibrillation:   Continue rate control with Metoprolol. On Coumadin prior to admission-given Vit K on admission-given GI bleeding. Coumadin now on hold. Await repeat INR.  Chads2vasc-5-but anticoagulation contraindicated at this timee   Chronic Systolic DDU:[id number] clinically. Last EF per Echo on 07/17/14-35-40%.    History of colon cancer, stage III:  Pt had colon cancer in 1992 and 1993.  Recent colonoscopy in Jan 2016 only showed one single benign polyp.  Likely cured per DR. Granfortuna.   Disposition: Remain inpatient to monitor bleeding.    Antimicrobial agents  none  Anti-infectives    None      DVT Prophylaxis: SCD's  Code Status: Full code   Family Communication No family in room. Discussed plan with patient.   Procedures: none  CONSULTS:  GI  Time spent 40 minutes-which includes 50% of the time with face-to-face with patient/ family and coordinating care related to the above assessment and plan.  MEDICATIONS: Scheduled Meds: . atorvastatin  40 mg Oral q1800  . donepezil  5 mg Oral QHS    . finasteride  5 mg Oral QHS  . insulin aspart  0-9 Units Subcutaneous 6 times per day  . isosorbide mononitrate  90 mg Oral Daily  . lisinopril  10 mg Oral Daily  . metoprolol succinate  100 mg Oral BID  . multivitamin-lutein  1 capsule Oral Daily  . pantoprazole  40 mg Oral Daily  . ranolazine  500 mg Oral BID  . sodium chloride  3 mL Intravenous Q12H   Continuous Infusions:  PRN Meds:.loratadine, polyvinyl alcohol    PHYSICAL EXAM: Vital signs in last 24 hours: Filed Vitals:   09/11/14 0500 09/11/14 0515 09/11/14 0528 09/11/14 0614  BP: 140/54 136/66 136/66 137/58  Pulse: 49 50 74 89  Temp:    97.7 F (36.5 C)  TempSrc:    Oral  Resp:   15 17  Height:      Weight:      SpO2: 100% 96% 95% 92%    Weight change:  Filed Weights   09/11/14 0335  Weight: 69.4 kg (153 lb)   Body mass index is 22.58 kg/(m^2).   Gen Exam: Awake and alert and in good spirits. Neck: Supple, No JVD.   Chest: B/L Clear.  BL normal air movement.  CVS: S1 S2 Regular, no murmurs.  Abdomen: soft, BS +, non tender, non distended.  Extremities: no edema, lower extremities warm to touch. Neurologic: Non Focal.   Skin: No Rash or erythema noted on exam. Wounds: N/A.   Intake/Output from previous day: No intake or  output data in the 24 hours ending 09/11/14 1246   LAB RESULTS: CBC  Recent Labs Lab 09/11/14 0345 09/11/14 0402  WBC 6.3  --   HGB 11.4* 11.9*  HCT 33.3* 35.0*  PLT 152  --   MCV 100.9*  --   MCH 34.5*  --   MCHC 34.2  --   RDW 13.2  --   LYMPHSABS 0.7  --   MONOABS 0.7  --   EOSABS 0.1  --   BASOSABS 0.0  --     Chemistries   Recent Labs Lab 09/11/14 0402  NA 138  K 3.8  CL 102  GLUCOSE 113*  BUN 16  CREATININE 1.30*    CBG: No results for input(s): GLUCAP in the last 168 hours.  GFR Estimated Creatinine Clearance: 37.8 mL/min (by C-G formula based on Cr of 1.3).  Coagulation profile  Recent Labs Lab 09/11/14 0345  INR 2.22*    Cardiac  Enzymes No results for input(s): CKMB, TROPONINI, MYOGLOBIN in the last 168 hours.  Invalid input(s): CK  Invalid input(s): POCBNP No results for input(s): DDIMER in the last 72 hours. No results for input(s): HGBA1C in the last 72 hours. No results for input(s): CHOL, HDL, LDLCALC, TRIG, CHOLHDL, LDLDIRECT in the last 72 hours. No results for input(s): TSH, T4TOTAL, T3FREE, THYROIDAB in the last 72 hours.  Invalid input(s): FREET3 No results for input(s): VITAMINB12, FOLATE, FERRITIN, TIBC, IRON, RETICCTPCT in the last 72 hours. No results for input(s): LIPASE, AMYLASE in the last 72 hours.  Urine Studies No results for input(s): UHGB, CRYS in the last 72 hours.  Invalid input(s): UACOL, UAPR, USPG, UPH, UTP, UGL, UKET, UBIL, UNIT, UROB, ULEU, UEPI, UWBC, URBC, UBAC, CAST, UCOM, BILUA  MICROBIOLOGY: Recent Results (from the past 240 hour(s))  Clostridium Difficile by PCR     Status: None   Collection Time: 09/11/14  7:39 AM  Result Value Ref Range Status   C difficile by pcr NEGATIVE NEGATIVE Final    RADIOLOGY STUDIES/RESULTS: Dg Abd Acute W/chest  09/11/2014   CLINICAL DATA:  Hematochezia  EXAM: DG ABDOMEN ACUTE W/ 1V CHEST  COMPARISON:  None.  FINDINGS: There is no evidence of dilated bowel loops or free intraperitoneal air. Renal collecting system calculi are evident. No ureteral calculi are evident. Heart size and mediastinal contours are within normal limits. Emphysematous and fibrotic appearing changes are present throughout both lungs without significant difference from 06/07/2014.  IMPRESSION: Negative for bowel obstruction or perforation. Bilateral nephrolithiasis. No acute cardiopulmonary disease.   Electronically Signed   By: Andreas Newport M.D.   On: 09/11/2014 04:21    Marlou Starks Emory PA-S  [facility] Pager:[phone]  If 7PM-7AM, please contact night-coverage [email or web address] Password [email or web address] 09/11/2014, 12:46 PM   LOS: 0 days   Attending MD  note  Patient was seen, examined,treatment plan was discussed with the PA-S.  I have directly personally reviewed the clinical findings, lab, imaging studies and management of this patient in detail. I agree with the documentation, as recorded by thePA-S.   Patient is 81 year old male with history of atrial fibrillation on Coumadin, chronic systolic heart failure-old presented with hematochezia earlier this morning. Continues to have hematochezia, given vitamin K on admission. Awaiting repeat CBC and chemistries. GI consulted. Plan is for supportive care and transfusion with PRBC if significant drop in hemoglobin. If bleeding continues, we can certainly get a bleeding scan-but for now follow clinical course.   [facility]

## 2014-09-11 NOTE — ED Provider Notes (Signed)
CSN: [id number]     Arrival date & time 09/11/14  0315 History  This chart was scribed for Tyress Loden, MD by Rayfield Citizen, ED Scribe. This patient was seen in room A01C/A01C and the patient's care was started at 3:40 AM.    Chief Complaint  Patient presents with  . Blood In Stools   Patient is a 81 y.o. male presenting with GI illness. The history is provided by the patient. No language interpreter was used.  GI Problem This is a new problem. The current episode started 1 to 2 hours ago. The problem occurs constantly. The problem has not changed since onset.Pertinent negatives include no chest pain, no abdominal pain, no headaches and no shortness of breath. Nothing aggravates the symptoms. Nothing relieves the symptoms. He has tried nothing for the symptoms. The treatment provided no relief.    HPI Comments: [name] is a 81 y.o. male who presents to the Emergency Department complaining of blood in stools, described as "blood in the commode." Patient reports he had three soft stools without pain tonight when he noticed the blood; blood was not mixed in stool. He denies any known hemorrhoids. He states he is up to date on his colonoscopies; last colonoscopy within the past 3-4 months and found several polyps. He denies any other complaints at this time.   Patient is currently on 2mg  warfarin daily.   Past Medical History  Diagnosis Date  . Coronary artery disease     a. s/p PCI w/ DES to mLAD 08/05/10. b. NSTEMI 08/2013 (mildly elev trop) - cath showing widely patent stent, 80% prox D1 (small vessel) with recommendation for medical management, residual 60% ostial PDA, <20% LCx, LVEF 55-65%.   . Hypertension   . Hyperlipidemia   . Barrett esophagus   . Gallstone pancreatitis 2011    a. s/p chole.  . SBO (small bowel obstruction) 2005  . Macrocytic anemia 06/25/2011  . PAF (paroxysmal atrial fibrillation)     a. failed TEE due to inability to pass probe into the esophagus in March  2013. b. On Coumadin. Was in NSR 08/2013 admission.  . Chronic anticoagulation   . Depression   . GERD (gastroesophageal reflux disease)   . Colon cancer     a. 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 b.  "graduated" from their practice 06/2013, remains free of any new disease per those notes.  . Type II diabetes mellitus   . Premature atrial contractions   . Premature ventricular contractions   . Aortic stenosis     a. Mild-mod by echo 2013.  [name] Chronic systolic CHF (congestive heart failure)     a. EF previously 35-40%. b. improved to 55-65% by cath 08/2013.  . Myelodysplastic disease     a. Suspected low-grade  myelodysplastic disease per onc notes.  . Other pancytopenia 06/18/2014    Mild, fluctuating, likely med related. Rule out early MDS from prior chemo/RT or colon cancer   Past Surgical History  Procedure Laterality Date  . Cholecystectomy  2011  . Right colectomy    . Coronary angioplasty with stent placement  08/05/10    DES to the LAD  . Cardiac catheterization  08/03/10  . Cardiac catheterization  08/09/10    LAD 30, stent OK, CFX 40, RCA < 20  . Abdominal mass resection      Mass near mesentery  . Cystoscopy      "with removal of kidney stone in office"  . Cataract  extraction Bilateral   . Appendectomy      "took out during colon surgery"  . Cystoscopy with retrograde pyelogram, ureteroscopy and stent placement Bilateral 05/18/2013    Procedure: CYSTOSCOPY WITH BILATERAL RETROGRADE PYELOGRAM, LEFT URETEROSCOPY AND LEFT STENT PLACEMENT;  Surgeon: Alexis Frock, MD;  Location: [facility];  Service: Urology;  Laterality: Bilateral;  . Colon surgery    . Tonsillectomy    . Left heart catheterization with coronary angiogram N/A 08/15/2013    Procedure: LEFT HEART CATHETERIZATION WITH CORONARY ANGIOGRAM;  Surgeon: Peter M Martinique, MD;  Location: [facility] CATH LAB;  Service: Cardiovascular;  Laterality: N/A;   Family History  Problem Relation Age of Onset  . Cancer Mother   .  Ulcers Father   . Heart attack Brother    History  Substance Use Topics  . Smoking status: Former Smoker -- 1.00 packs/day for 30 years    Types: Cigarettes    Quit date: 05/10/1994  . Smokeless tobacco: Never Used  . Alcohol Use: 1.2 oz/week    2 Shots of liquor per week     Comment: occasionally.    Review of Systems  Respiratory: Negative for shortness of breath.   Cardiovascular: Negative for chest pain.  Gastrointestinal: Positive for blood in stool. Negative for abdominal pain.  Neurological: Negative for headaches.  All other systems reviewed and are negative.   Allergies  Metformin and related  Home Medications   Prior to Admission medications   Medication Sig Start Date End Date Taking? Authorizing Provider  aspirin EC 81 MG tablet Take 81 mg by mouth daily.    Historical Provider, MD  atorvastatin (LIPITOR) 40 MG tablet Take 1 tablet (40 mg total) by mouth daily at 6 PM. 10/22/13   Thayer Headings, MD  Cholecalciferol (VITAMIN D-3 PO) Take 1 tablet by mouth daily.     Historical Provider, MD  donepezil (ARICEPT) 5 MG tablet Take 5 mg by mouth at bedtime.  03/16/14   Historical Provider, MD  finasteride (PROSCAR) 5 MG tablet Take 5 mg by mouth at bedtime.  04/01/14   Historical Provider, MD  glimepiride (AMARYL) 4 MG tablet Take 4 mg by mouth daily after breakfast.     Historical Provider, MD  isosorbide mononitrate (IMDUR) 60 MG 24 hr tablet Take 1.5 tablets (90 mg total) by mouth daily. 06/08/14   Ripudeep Krystal Eaton, MD  linagliptin (TRADJENTA) 5 MG TABS tablet Take 5 mg by mouth daily as needed. For blood sugar    Historical Provider, MD  lisinopril (PRINIVIL,ZESTRIL) 10 MG tablet Take 1 tablet (10 mg total) by mouth daily. 04/09/14   Thayer Headings, MD  loratadine (CLARITIN) 10 MG tablet Take 10 mg by mouth daily as needed for allergies.    Historical Provider, MD  Melatonin (RA MELATONIN) 10 MG TABS Take 10 mg by mouth at bedtime.     Historical Provider, MD  metoprolol  succinate (TOPROL-XL) 100 MG 24 hr tablet Take 100 mg by mouth 2 (two) times daily at 10 AM and 5 PM. Take with or immediately following a meal.    Lavone Orn, MD  Multiple Vitamins-Minerals (PRESERVISION/LUTEIN) CAPS Take 1 capsule by mouth daily.  08/13/10   Romeo Apple, MD  nitroGLYCERIN (NITROSTAT) 0.4 MG SL tablet Place 1 tablet (0.4 mg total) under the tongue every 5 (five) minutes as needed for chest pain. 06/27/14   Thayer Headings, MD  omeprazole (PRILOSEC) 20 MG capsule Take 20 mg by mouth daily.  Historical Provider, MD  Polyethyl Glycol-Propyl Glycol (SYSTANE OP) Place 1 drop into both eyes as directed. 2-4 times a day    Historical Provider, MD  ranolazine (RANEXA) 500 MG 12 hr tablet Take 1 tablet (500 mg total) by mouth 2 (two) times daily. 03/28/14   Thayer Headings, MD  warfarin (COUMADIN) 4 MG tablet Take 0.5 tablets (2 mg total) by mouth daily. 06/09/14   Ripudeep K Rai, MD   BP 143/72 mmHg  Pulse 78  Temp(Src) 98.1 F (36.7 C) (Oral)  Resp 16  Ht 5\' 9"  (1.753 m)  Wt 153 lb (69.4 kg)  BMI 22.58 kg/m2  SpO2 93% Physical Exam  Constitutional: He is oriented to person, place, and time. He appears well-developed and well-nourished. No distress.  HENT:  Head: Normocephalic and atraumatic.  Mouth/Throat: Oropharynx is clear and moist. No oropharyngeal exudate.  Moist mucous membranes  Eyes: EOM are normal. Pupils are equal, round, and reactive to light.  Neck: Normal range of motion. Neck supple. No JVD present.  Cardiovascular: Normal rate, regular rhythm and normal heart sounds.  Exam reveals no gallop and no friction rub.   No murmur heard. Pulmonary/Chest: Effort normal and breath sounds normal. No respiratory distress. He has no wheezes. He has no rales.  Abdominal: Soft. Bowel sounds are normal. He exhibits no mass. There is no tenderness. There is no rebound and no guarding.  Genitourinary: Guaiac positive stool.  External non-bleeding hemorrhoid and internal  hemorrhoid; gross blood noted. Specimen provided by patient is mostly maroon blood.   Musculoskeletal: Normal range of motion. He exhibits no edema.  Moves all extremities normally.   Lymphadenopathy:    He has no cervical adenopathy.  Neurological: He is alert and oriented to person, place, and time. He displays normal reflexes.  Skin: Skin is warm and dry. No rash noted.  Psychiatric: He has a normal mood and affect. His behavior is normal.  Nursing note and vitals reviewed.   ED Course  Procedures   DIAGNOSTIC STUDIES: Oxygen Saturation is 93% on RA, low by my interpretation.    COORDINATION OF CARE: 3:47 AM Discussed treatment plan with pt at bedside and pt agreed to plan.   Labs Review Labs Reviewed  CBC WITH DIFFERENTIAL/PLATELET  PROTIME-INR  I-STAT CHEM 8, ED  TYPE AND SCREEN    Imaging Review No results found.   EKG Interpretation None      MDM   Final diagnoses:  None   Results for orders placed or performed during the hospital encounter of 09/11/14  CBC with Differential/Platelet  Result Value Ref Range   WBC 6.3 4.0 - 10.5 K/uL   RBC 3.30 (L) 4.22 - 5.81 MIL/uL   Hemoglobin 11.4 (L) 13.0 - 17.0 g/dL   HCT 33.3 (L) 39.0 - 52.0 %   MCV 100.9 (H) 78.0 - 100.0 fL   MCH 34.5 (H) 26.0 - 34.0 pg   MCHC 34.2 30.0 - 36.0 g/dL   RDW 13.2 11.5 - 15.5 %   Platelets 152 150 - 400 K/uL   Neutrophils Relative % 76 43 - 77 %   Neutro Abs 4.8 1.7 - 7.7 K/uL   Lymphocytes Relative 11 (L) 12 - 46 %   Lymphs Abs 0.7 0.7 - 4.0 K/uL   Monocytes Relative 11 3 - 12 %   Monocytes Absolute 0.7 0.1 - 1.0 K/uL   Eosinophils Relative 2 0 - 5 %   Eosinophils Absolute 0.1 0.0 - 0.7 K/uL   Basophils  Relative 0 0 - 1 %   Basophils Absolute 0.0 0.0 - 0.1 K/uL  Protime-INR  Result Value Ref Range   Prothrombin Time 24.8 (H) 11.6 - 15.2 seconds   INR 2.22 (H) 0.00 - 1.49  I-stat chem 8, ed  Result Value Ref Range   Sodium 138 135 - 145 mmol/L   Potassium 3.8 3.5 - 5.1  mmol/L   Chloride 102 101 - 111 mmol/L   BUN 16 6 - 20 mg/dL   Creatinine, Ser 1.30 (H) 0.61 - 1.24 mg/dL   Glucose, Bld 113 (H) 70 - 99 mg/dL   Calcium, Ion 1.20 1.13 - 1.30 mmol/L   TCO2 20 0 - 100 mmol/L   Hemoglobin 11.9 (L) 13.0 - 17.0 g/dL   HCT 35.0 (L) 39.0 - 52.0 %  POC occult blood, ED Provider will collect  Result Value Ref Range   Fecal Occult Bld POSITIVE (A) NEGATIVE  Type and screen for Red Blood Exchange  Result Value Ref Range   ABO/RH(D) A POS    Antibody Screen NEG    Sample Expiration 09/14/2014    Dg Abd Acute W/chest  09/11/2014   CLINICAL DATA:  Hematochezia  EXAM: DG ABDOMEN ACUTE W/ 1V CHEST  COMPARISON:  None.  FINDINGS: There is no evidence of dilated bowel loops or free intraperitoneal air. Renal collecting system calculi are evident. No ureteral calculi are evident. Heart size and mediastinal contours are within normal limits. Emphysematous and fibrotic appearing changes are present throughout both lungs without significant difference from 06/07/2014.  IMPRESSION: Negative for bowel obstruction or perforation. Bilateral nephrolithiasis. No acute cardiopulmonary disease.   Electronically Signed   By: Andreas Newport M.D.   On: 09/11/2014 04:21     I personally performed the services described in this documentation, which was scribed in my presence. The recorded information has been reviewed and is accurate.       Veatrice Kells, MD 09/11/14 [phone]

## 2014-09-11 NOTE — H&P (Signed)
[facility] History and Physical  [name] OZY:[id number] DOB: 23-Mar-1924 DOA: 09/11/2014  Referring physician: EDP PCP: Irven Shelling, MD   Chief Complaint: Blood in stools   HPI: [name] is a 81 y.o. male with h/o colon cancer back in 1992 and recurrence in 1993, s/p resection x2 and in remission since then.  Patient presents to ED after a couple day history of blood in stools.  This has worsened especially yesterday with multiple episodes of blood in commode most recently.  His most recent colonoscopy was "3-4 months ago" (was actually on Jan 13th by Dr. Amedeo Plenty) and showed "a couple of polyps" (single benign polyp according to path report).  Patient denies any known hemorrhoids subjectively (objectively has non-bleeding internal and external hemorrhoids on exam).  No other complaints at this time.  Review of Systems: Systems reviewed.  As above, otherwise negative  Past Medical History  Diagnosis Date  . Coronary artery disease     a. s/p PCI w/ DES to mLAD 08/05/10. b. NSTEMI 08/2013 (mildly elev trop) - cath showing widely patent stent, 80% prox D1 (small vessel) with recommendation for medical management, residual 60% ostial PDA, <20% LCx, LVEF 55-65%.   . Hypertension   . Hyperlipidemia   . Barrett esophagus   . Gallstone pancreatitis 2011    a. s/p chole.  . SBO (small bowel obstruction) 2005  . Macrocytic anemia 06/25/2011  . PAF (paroxysmal atrial fibrillation)     a. failed TEE due to inability to pass probe into the esophagus in March 2013. b. On Coumadin. Was in NSR 08/2013 admission.  . Chronic anticoagulation   . Depression   . GERD (gastroesophageal reflux disease)   . Colon cancer     a. 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 b.  "graduated" from their practice 06/2013, remains free of any new disease per those notes.  . Type II diabetes mellitus   . Premature atrial contractions   . Premature ventricular contractions   .  Aortic stenosis     a. Mild-mod by echo 2013.  [name] Chronic systolic CHF (congestive Blackwell failure)     a. EF previously 35-40%. b. improved to 55-65% by cath 08/2013.  . Myelodysplastic disease     a. Suspected low-grade  myelodysplastic disease per onc notes.  . Other pancytopenia 06/18/2014    Mild, fluctuating, likely med related. Rule out early MDS from prior chemo/RT or colon cancer   Past Surgical History  Procedure Laterality Date  . Cholecystectomy  2011  . Right colectomy    . Coronary angioplasty with stent placement  08/05/10    DES to the LAD  . Cardiac catheterization  08/03/10  . Cardiac catheterization  08/09/10    LAD 30, stent OK, CFX 40, RCA < 20  . Abdominal mass resection      Mass near mesentery  . Cystoscopy      "with removal of kidney stone in office"  . Cataract extraction Bilateral   . Appendectomy      "took out during colon surgery"  . Cystoscopy with retrograde pyelogram, ureteroscopy and stent placement Bilateral 05/18/2013    Procedure: CYSTOSCOPY WITH BILATERAL RETROGRADE PYELOGRAM, LEFT URETEROSCOPY AND LEFT STENT PLACEMENT;  Surgeon: Alexis Frock, MD;  Location: [facility];  Service: Urology;  Laterality: Bilateral;  . Colon surgery    . Tonsillectomy    . Left Blackwell catheterization with coronary angiogram N/A 08/15/2013    Procedure: LEFT Blackwell CATHETERIZATION WITH CORONARY  Cyril Loosen;  Surgeon: Peter M Martinique, MD;  Location: [facility] CATH LAB;  Service: Cardiovascular;  Laterality: N/A;   Social History:  reports that he quit smoking about 20 years ago. His smoking use included Cigarettes. He has a 30 pack-year smoking history. He has never used smokeless tobacco. He reports that he drinks about 1.2 oz of alcohol per week. He reports that he does not use illicit drugs.  Allergies  Allergen Reactions  . Metformin And Related Nausea And Vomiting    Family History  Problem Relation Age of Onset  . Cancer Mother   . Ulcers Father   . Blackwell attack Brother       Prior to Admission medications   Medication Sig Start Date End Date Taking? Authorizing Provider  aspirin EC 81 MG tablet Take 81 mg by mouth daily.   Yes Historical Provider, MD  atorvastatin (LIPITOR) 40 MG tablet Take 1 tablet (40 mg total) by mouth daily at 6 PM. 10/22/13  Yes Thayer Headings, MD  Cholecalciferol (VITAMIN D-3 PO) Take 1 tablet by mouth daily.    Yes Historical Provider, MD  donepezil (ARICEPT) 5 MG tablet Take 5 mg by mouth at bedtime.  03/16/14  Yes Historical Provider, MD  finasteride (PROSCAR) 5 MG tablet Take 5 mg by mouth at bedtime.  04/01/14  Yes Historical Provider, MD  glimepiride (AMARYL) 4 MG tablet Take 4 mg by mouth daily after breakfast.    Yes Historical Provider, MD  isosorbide mononitrate (IMDUR) 60 MG 24 hr tablet Take 1.5 tablets (90 mg total) by mouth daily. 06/08/14  Yes Ripudeep Krystal Eaton, MD  linagliptin (TRADJENTA) 5 MG TABS tablet Take 5 mg by mouth daily. For blood sugar   Yes Historical Provider, MD  lisinopril (PRINIVIL,ZESTRIL) 10 MG tablet Take 1 tablet (10 mg total) by mouth daily. 04/09/14  Yes Thayer Headings, MD  loratadine (CLARITIN) 10 MG tablet Take 10 mg by mouth daily as needed for allergies.   Yes Historical Provider, MD  Melatonin (RA MELATONIN) 10 MG TABS Take 10 mg by mouth at bedtime.    Yes Historical Provider, MD  metoprolol succinate (TOPROL-XL) 100 MG 24 hr tablet Take 100 mg by mouth 2 (two) times daily at 10 AM and 5 PM. Take with or immediately following a meal.   Yes Lavone Orn, MD  Multiple Vitamins-Minerals (PRESERVISION/LUTEIN) CAPS Take 1 capsule by mouth daily.  08/13/10  Yes Romeo Apple, MD  nitroGLYCERIN (NITROSTAT) 0.4 MG SL tablet Place 1 tablet (0.4 mg total) under the tongue every 5 (five) minutes as needed for chest pain. 06/27/14  Yes Thayer Headings, MD  omeprazole (PRILOSEC) 20 MG capsule Take 20 mg by mouth daily.   Yes Historical Provider, MD  Polyethyl Glycol-Propyl Glycol (SYSTANE OP) Place 1 drop into both  eyes as directed. 2-4 times a day   Yes Historical Provider, MD  ranolazine (RANEXA) 500 MG 12 hr tablet Take 1 tablet (500 mg total) by mouth 2 (two) times daily. 03/28/14  Yes Thayer Headings, MD  warfarin (COUMADIN) 4 MG tablet Take 0.5 tablets (2 mg total) by mouth daily. Patient taking differently: Take 2-4 mg by mouth daily. Take 2 mg on Tues / Thurs ** Take 4 mg on Sun / Mon / Wed / Fri / Sat 06/09/14  Yes Ripudeep Krystal Eaton, MD   Physical Exam: Filed Vitals:   09/11/14 0420  BP: 150/96  Pulse: 100  Temp:   Resp:     BP  150/96 mmHg  Pulse 100  Temp(Src) 98.1 F (36.7 C) (Oral)  Resp 16  Ht 5\' 9"  (1.753 m)  Wt 69.4 kg (153 lb)  BMI 22.58 kg/m2  SpO2 97%  General Appearance:    Alert, oriented, no distress, appears stated age  Head:    Normocephalic, atraumatic  Eyes:    PERRL, EOMI, sclera non-icteric        Nose:   Nares without drainage or epistaxis. Mucosa, turbinates normal  Throat:   Moist mucous membranes. Oropharynx without erythema or exudate.  Neck:   Supple. No carotid bruits.  No thyromegaly.  No lymphadenopathy.   Back:     No CVA tenderness, no spinal tenderness  Lungs:     Clear to auscultation bilaterally, without wheezes, rhonchi or rales  Chest wall:    No tenderness to palpitation  Blackwell:    Regular rate and rhythm without murmurs, gallops, rubs  Abdomen:     Soft, non-tender, nondistended, normal bowel sounds, no organomegaly  Genitalia:    deferred  Rectal:    deferred  Extremities:   No clubbing, cyanosis or edema.  Pulses:   2+ and symmetric all extremities  Skin:   Skin color, texture, turgor normal, no rashes or lesions  Lymph nodes:   Cervical, supraclavicular, and axillary nodes normal  Neurologic:   CNII-XII intact. Normal strength, sensation and reflexes      throughout    Labs on Admission:  Basic Metabolic Panel:  Recent Labs Lab 09/11/14 0402  NA 138  K 3.8  CL 102  GLUCOSE 113*  BUN 16  CREATININE 1.30*   Liver Function  Tests: No results for input(s): AST, ALT, ALKPHOS, BILITOT, PROT, ALBUMIN in the last 168 hours. No results for input(s): LIPASE, AMYLASE in the last 168 hours. No results for input(s): AMMONIA in the last 168 hours. CBC:  Recent Labs Lab 09/11/14 0345 09/11/14 0402  WBC 6.3  --   NEUTROABS 4.8  --   HGB 11.4* 11.9*  HCT 33.3* 35.0*  MCV 100.9*  --   PLT 152  --    Cardiac Enzymes: No results for input(s): CKTOTAL, CKMB, CKMBINDEX, TROPONINI in the last 168 hours.  BNP (last 3 results) No results for input(s): PROBNP in the last 8760 hours. CBG: No results for input(s): GLUCAP in the last 168 hours.  Radiological Exams on Admission: Dg Abd Acute W/chest  09/11/2014   CLINICAL DATA:  Hematochezia  EXAM: DG ABDOMEN ACUTE W/ 1V CHEST  COMPARISON:  None.  FINDINGS: There is no evidence of dilated bowel loops or free intraperitoneal air. Renal collecting system calculi are evident. No ureteral calculi are evident. Blackwell size and mediastinal contours are within normal limits. Emphysematous and fibrotic appearing changes are present throughout both lungs without significant difference from 06/07/2014.  IMPRESSION: Negative for bowel obstruction or perforation. Bilateral nephrolithiasis. No acute cardiopulmonary disease.   Electronically Signed   By: Andreas Newport M.D.   On: 09/11/2014 04:21    EKG: Independently reviewed.  Assessment/Plan Principal Problem:   BRBPR (bright red blood per rectum) Active Problems:   Hypertension   Diabetes mellitus   Atrial fibrillation   History of colon cancer, stage III   1. BRBPR - Suspect diverticular bleed, ddx includes sequela of radiation therapy, anastomotic bleed and recurrence of colon cancer, though the latter 2 would be very distant out given that surgery was 20 years ago and he has been cancer free since then and his colonoscopy was negative  except for a benign polyp just back in January this year. 1. Repeat HGB ordered at  noon 2. Call GI in AM 3. Clear liquid diet only 4. Consider CT abdomen / pelvis to look for local recurrence of colon cancer if bleed source not localized by GI work up. 5. Reverse coumadin with vitamin K, HGB fairly stable right now so will hold off on FFP for now. 2. HTN - continue home meds 3. DM2 - hold home meds and putting on low dose SSI q4h 4. A.Fib - continue rate control, reversing coumadin 5. History of stage 3 colon cancer - s/p resection x2 in 1992-1993, in remission since then, patient "graduated" from routine surveillance and is believed to be cured (see Dr. Azucena Freed office note 06/18/14).    Code Status: Full Code  Family Communication: No family in room Disposition Plan: Admit to inpatient   Time spent: 70 min  Strother Everitt M. [facility] Pager [phone]  If 7AM-7PM, please contact the day team taking care of the patient [email or web address] Password [email or web address] 09/11/2014, 5:23 AM

## 2014-09-11 NOTE — ED Notes (Signed)
Pt drove himself to the ED.  He reports 3 loose stools all containing blood since midnight.

## 2014-09-11 NOTE — Consult Note (Signed)
[facility] CONSULT Reason for consult: G.I. bleeding Referring Physician: [facility]. PCP: Dr. Lavone Orn. Primary G.I.: Dr. Teena Irani  [name] is an 81 y.o. male.  HPI: he's had a history of colon cancer dating back to 1992 when he underwent urgent resection of sigmoid tumor that was classified as stage III. Approximately one year later he had to have another operation for recurrence and he underwent another operation with subsequent treatment with chemotherapy and radiation therapy due to recurrent retroperitoneal disease. Apparently this was curative since he has had subsequent surveillance colonoscopies ever since that a been negative. He has been followed by Dr. Beryle Beams. He has had some mild myelodysplastic syndrome as well. His last routine surveillance colonoscopy was 1/16 by Dr. Amedeo Plenty and a small serrated adenoma was removed. Patient has had diverticulosis on previous colonoscopies. He is followed by Dr. [name] for atrial fibrillation and his chronically anticoagulated. [name] had a history of ventricular arrhythmias aortic stenosis as well. He presented to the emergency room with rectal bleeding. Had several bright red bloody stools the day before his admission. INR elevated 2.2. Hemoglobin was 11.4. His Coumadin has been reversed. The patient just had a stool that he said was now dark when it previously had been bloody. He is having no abdominal pain.  Past Medical History  Diagnosis Date  . Coronary artery disease     a. s/p PCI w/ DES to mLAD 08/05/10. b. NSTEMI 08/2013 (mildly elev trop) - cath showing widely patent stent, 80% prox D1 (small vessel) with recommendation for medical management, residual 60% ostial PDA, <20% LCx, LVEF 55-65%.   . Hypertension   . Hyperlipidemia   . Barrett esophagus   . Gallstone pancreatitis 2011    a. s/p chole.  . SBO (small bowel obstruction) 2005  . Macrocytic anemia 06/25/2011  . PAF (paroxysmal atrial fibrillation)      a. failed TEE due to inability to pass probe into the esophagus in March 2013. b. On Coumadin. Was in NSR 08/2013 admission.  . Chronic anticoagulation   . Depression   . GERD (gastroesophageal reflux disease)   . Colon cancer     a. 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 b.  "graduated" from their practice 06/2013, remains free of any new disease per those notes.  . Type II diabetes mellitus   . Premature atrial contractions   . Premature ventricular contractions   . Aortic stenosis     a. Mild-mod by echo 2013.  [name] Chronic systolic CHF (congestive heart failure)     a. EF previously 35-40%. b. improved to 55-65% by cath 08/2013.  . Myelodysplastic disease     a. Suspected low-grade  myelodysplastic disease per onc notes.  . Other pancytopenia 06/18/2014    Mild, fluctuating, likely med related. Rule out early MDS from prior chemo/RT or colon cancer    Past Surgical History  Procedure Laterality Date  . Cholecystectomy  2011  . Right colectomy    . Coronary angioplasty with stent placement  08/05/10    DES to the LAD  . Cardiac catheterization  08/03/10  . Cardiac catheterization  08/09/10    LAD 30, stent OK, CFX 40, RCA < 20  . Abdominal mass resection      Mass near mesentery  . Cystoscopy      "with removal of kidney stone in office"  . Cataract extraction Bilateral   . Appendectomy      "took out during colon surgery"  .  Cystoscopy with retrograde pyelogram, ureteroscopy and stent placement Bilateral 05/18/2013    Procedure: CYSTOSCOPY WITH BILATERAL RETROGRADE PYELOGRAM, LEFT URETEROSCOPY AND LEFT STENT PLACEMENT;  Surgeon: Alexis Frock, MD;  Location: [facility];  Service: Urology;  Laterality: Bilateral;  . Colon surgery    . Tonsillectomy    . Left heart catheterization with coronary angiogram N/A 08/15/2013    Procedure: LEFT HEART CATHETERIZATION WITH CORONARY ANGIOGRAM;  Surgeon: Peter M Martinique, MD;  Location: [facility] C[facility];  Service: Cardiovascular;  Laterality:  N/A;    Family History  Problem Relation Age of Onset  . Cancer Mother   . Ulcers Father   . Heart attack Brother     Social History:  reports that he quit smoking about 20 years ago. His smoking use included Cigarettes. He has a 30 pack-year smoking history. He has never used smokeless tobacco. He reports that he drinks about 1.2 oz of alcohol per week. He reports that he does not use illicit drugs.  Allergies:  Allergies  Allergen Reactions  . Metformin And Related Nausea And Vomiting    Medications; Prior to Admission medications   Medication Sig Start Date End Date Taking? Authorizing Provider  aspirin EC 81 MG tablet Take 81 mg by mouth daily.   Yes Historical Provider, MD  atorvastatin (LIPITOR) 40 MG tablet Take 1 tablet (40 mg total) by mouth daily at 6 PM. 10/22/13  Yes Thayer Headings, MD  Cholecalciferol (VITAMIN D-3 PO) Take 1 tablet by mouth daily.    Yes Historical Provider, MD  donepezil (ARICEPT) 5 MG tablet Take 5 mg by mouth at bedtime.  03/16/14  Yes Historical Provider, MD  finasteride (PROSCAR) 5 MG tablet Take 5 mg by mouth at bedtime.  04/01/14  Yes Historical Provider, MD  glimepiride (AMARYL) 4 MG tablet Take 4 mg by mouth daily after breakfast.    Yes Historical Provider, MD  isosorbide mononitrate (IMDUR) 60 MG 24 hr tablet Take 1.5 tablets (90 mg total) by mouth daily. 06/08/14  Yes Ripudeep Krystal Eaton, MD  linagliptin (TRADJENTA) 5 MG TABS tablet Take 5 mg by mouth daily. For blood sugar   Yes Historical Provider, MD  lisinopril (PRINIVIL,ZESTRIL) 10 MG tablet Take 1 tablet (10 mg total) by mouth daily. 04/09/14  Yes Thayer Headings, MD  loratadine (CLARITIN) 10 MG tablet Take 10 mg by mouth daily as needed for allergies.   Yes Historical Provider, MD  Melatonin (RA MELATONIN) 10 MG TABS Take 10 mg by mouth at bedtime.    Yes Historical Provider, MD  metoprolol succinate (TOPROL-XL) 100 MG 24 hr tablet Take 100 mg by mouth 2 (two) times daily at 10 AM and 5 PM.  Take with or immediately following a meal.   Yes Lavone Orn, MD  Multiple Vitamins-Minerals (PRESERVISION/LUTEIN) CAPS Take 1 capsule by mouth daily.  08/13/10  Yes Romeo Apple, MD  nitroGLYCERIN (NITROSTAT) 0.4 MG SL tablet Place 1 tablet (0.4 mg total) under the tongue every 5 (five) minutes as needed for chest pain. 06/27/14  Yes Thayer Headings, MD  omeprazole (PRILOSEC) 20 MG capsule Take 20 mg by mouth daily.   Yes Historical Provider, MD  Polyethyl Glycol-Propyl Glycol (SYSTANE OP) Place 1 drop into both eyes as directed. 2-4 times a day   Yes Historical Provider, MD  ranolazine (RANEXA) 500 MG 12 hr tablet Take 1 tablet (500 mg total) by mouth 2 (two) times daily. 03/28/14  Yes Thayer Headings, MD  warfarin (COUMADIN) 4 MG  tablet Take 0.5 tablets (2 mg total) by mouth daily. Patient taking differently: Take 2-4 mg by mouth daily. Take 2 mg on Tues / Thurs ** Take 4 mg on Sun / Mon / Wed / Fri / Sat 06/09/14  Yes Ripudeep K Rai, MD   . atorvastatin  40 mg Oral q1800  . donepezil  5 mg Oral QHS  . finasteride  5 mg Oral QHS  . insulin aspart  0-9 Units Subcutaneous 6 times per day  . [START ON 09/12/2014] isosorbide mononitrate  90 mg Oral Daily  . metoprolol succinate  100 mg Oral BID  . multivitamin-lutein  1 capsule Oral Daily  . pantoprazole  40 mg Oral Daily  . ranolazine  500 mg Oral BID  . sodium chloride  3 mL Intravenous Q12H   PRN Meds loratadine, polyvinyl alcohol Results for orders placed or performed during the hospital encounter of 09/11/14 (from the past 48 hour(s))  CBC with Differential/Platelet     Status: Abnormal   Collection Time: 09/11/14  3:45 AM  Result Value Ref Range   WBC 6.3 4.0 - 10.5 K/uL   RBC 3.30 (L) 4.22 - 5.81 MIL/uL   Hemoglobin 11.4 (L) 13.0 - 17.0 g/dL   HCT 33.3 (L) 39.0 - 52.0 %   MCV 100.9 (H) 78.0 - 100.0 fL   MCH 34.5 (H) 26.0 - 34.0 pg   MCHC 34.2 30.0 - 36.0 g/dL   RDW 13.2 11.5 - 15.5 %   Platelets 152 150 - 400 K/uL   Neutrophils  Relative % 76 43 - 77 %   Neutro Abs 4.8 1.7 - 7.7 K/uL   Lymphocytes Relative 11 (L) 12 - 46 %   Lymphs Abs 0.7 0.7 - 4.0 K/uL   Monocytes Relative 11 3 - 12 %   Monocytes Absolute 0.7 0.1 - 1.0 K/uL   Eosinophils Relative 2 0 - 5 %   Eosinophils Absolute 0.1 0.0 - 0.7 K/uL   Basophils Relative 0 0 - 1 %   Basophils Absolute 0.0 0.0 - 0.1 K/uL  Protime-INR     Status: Abnormal   Collection Time: 09/11/14  3:45 AM  Result Value Ref Range   Prothrombin Time 24.8 (H) 11.6 - 15.2 seconds   INR 2.22 (H) 0.00 - 1.49  Type and screen for Red Blood Exchange     Status: None   Collection Time: 09/11/14  3:45 AM  Result Value Ref Range   ABO/RH(D) A POS    Antibody Screen NEG    Sample Expiration 09/14/2014   ABO/Rh     Status: None   Collection Time: 09/11/14  3:45 AM  Result Value Ref Range   ABO/RH(D) A POS   I-stat chem 8, ed     Status: Abnormal   Collection Time: 09/11/14  4:02 AM  Result Value Ref Range   Sodium 138 135 - 145 mmol/L   Potassium 3.8 3.5 - 5.1 mmol/L   Chloride 102 101 - 111 mmol/L   BUN 16 6 - 20 mg/dL   Creatinine, Ser 1.30 (H) 0.61 - 1.24 mg/dL   Glucose, Bld 113 (H) 70 - 99 mg/dL   Calcium, Ion 1.20 1.13 - 1.30 mmol/L   TCO2 20 0 - 100 mmol/L   Hemoglobin 11.9 (L) 13.0 - 17.0 g/dL   HCT 35.0 (L) 39.0 - 52.0 %  POC occult blood, ED Provider will collect     Status: Abnormal   Collection Time: 09/11/14  5:16 AM  Result Value Ref Range   Fecal Occult Bld POSITIVE (A) NEGATIVE  Clostridium Difficile by PCR     Status: None   Collection Time: 09/11/14  7:39 AM  Result Value Ref Range   C difficile by pcr NEGATIVE NEGATIVE    Dg Abd Acute W/chest  09/11/2014   CLINICAL DATA:  Hematochezia  EXAM: DG ABDOMEN ACUTE W/ 1V CHEST  COMPARISON:  None.  FINDINGS: There is no evidence of dilated bowel loops or free intraperitoneal air. Renal collecting system calculi are evident. No ureteral calculi are evident. Heart size and mediastinal contours are within normal  limits. Emphysematous and fibrotic appearing changes are present throughout both lungs without significant difference from 06/07/2014.  IMPRESSION: Negative for bowel obstruction or perforation. Bilateral nephrolithiasis. No acute cardiopulmonary disease.   Electronically Signed   By: Andreas Newport M.D.   On: 09/11/2014 04:21               Blood pressure 118/56, pulse 87, temperature 98.5 F (36.9 C), temperature source Oral, resp. rate 16, height 5\' 9"  (1.753 m), weight 69.4 kg (153 lb), SpO2 98 %.  Physical exam:   General--elderly white male no acute distress ENT-- sclera nonicteric throat reveals no signs of active bleeding Neck-- normal Heart-- regular rate and rhythm without murmurs are gallops Lungs--clear Abdomen-- soft and nontender Psych-- alert and oriented   Assessment: 1. G.I. bleeding. Probably lower G.I. due to anticoagulation. 2. Atrial fib. Chronically anticoagulated. Coumadin has been reversed. 3. History of stage III sigmoid colon cancer with recurrence and aggressive chemo and radiation therapy in the early 1990s with no signs of recurrent disease. 4. Mild myelodysplastic syndrome  Plan: patients colonoscopies are up-to-date. It appears that he has stop bleeding. If he does successfully stop bleeding I don't feel that he needs any further therapy at this time. We'll keep among clear liquids for now.   Elektra Wartman JR,Danitza Schoenfeldt L 09/11/2014, 2:09 PM   Pager: [phone] If no answer or after hours call [phone]

## 2014-09-12 DIAGNOSIS — K922 Gastrointestinal hemorrhage, unspecified: Secondary | ICD-10-CM

## 2014-09-12 DIAGNOSIS — D62 Acute posthemorrhagic anemia: Secondary | ICD-10-CM

## 2014-09-12 LAB — CBC
HCT: 28.6 % — ABNORMAL LOW (ref 39.0–52.0)
HCT: 27.6 % — ABNORMAL LOW (ref 39.0–52.0)
HEMATOCRIT: 28.4 % — AB (ref 39.0–52.0)
HEMOGLOBIN: 9.8 g/dL — AB (ref 13.0–17.0)
Hemoglobin: 9.8 g/dL — ABNORMAL LOW (ref 13.0–17.0)
Hemoglobin: 9.9 g/dL — ABNORMAL LOW (ref 13.0–17.0)
MCH: 33.1 pg (ref 26.0–34.0)
MCH: 33.4 pg (ref 26.0–34.0)
MCH: 34.1 pg — AB (ref 26.0–34.0)
MCHC: 34.3 g/dL (ref 30.0–36.0)
MCHC: 34.5 g/dL (ref 30.0–36.0)
MCHC: 35.9 g/dL (ref 30.0–36.0)
MCV: 95.2 fL (ref 78.0–100.0)
MCV: 95.9 fL (ref 78.0–100.0)
MCV: 97.6 fL (ref 78.0–100.0)
Platelets: 118 10*3/uL — ABNORMAL LOW (ref 150–400)
Platelets: 124 10*3/uL — ABNORMAL LOW (ref 150–400)
Platelets: 121 10*3/uL — ABNORMAL LOW (ref 150–400)
RBC: 2.9 MIL/uL — AB (ref 4.22–5.81)
RBC: 2.93 MIL/uL — ABNORMAL LOW (ref 4.22–5.81)
RBC: 2.96 MIL/uL — AB (ref 4.22–5.81)
RDW: 17.4 % — ABNORMAL HIGH (ref 11.5–15.5)
RDW: 17.8 % — AB (ref 11.5–15.5)
RDW: 17.9 % — AB (ref 11.5–15.5)
WBC: 5.1 10*3/uL (ref 4.0–10.5)
WBC: 5.8 10*3/uL (ref 4.0–10.5)
WBC: 6.8 10*3/uL (ref 4.0–10.5)

## 2014-09-12 LAB — BASIC METABOLIC PANEL
Anion gap: 8 (ref 5–15)
BUN: 12 mg/dL (ref 6–20)
CALCIUM: 8.1 mg/dL — AB (ref 8.9–10.3)
CO2: 21 mmol/L — ABNORMAL LOW (ref 22–32)
Chloride: 107 mmol/L (ref 101–111)
Creatinine, Ser: 1.12 mg/dL (ref 0.61–1.24)
GFR calc Af Amer: >60 mL/min (ref >60)
GFR calc non Af Amer: 56 mL/min — ABNORMAL LOW (ref >60)
Glucose, Bld: 99 mg/dL (ref 70–99)
Potassium: 3.8 mmol/L (ref 3.5–5.1)
Sodium: 136 mmol/L (ref 135–145)

## 2014-09-12 LAB — TYPE AND SCREEN
ABO/RH(D): A POS
ANTIBODY SCREEN: NEGATIVE
UNIT DIVISION: 0

## 2014-09-12 LAB — PROTIME-INR
INR: 1.4 (ref 0.00–1.49)
Prothrombin Time: 17.3 seconds — ABNORMAL HIGH (ref 11.6–15.2)

## 2014-09-12 LAB — GLUCOSE, CAPILLARY
GLUCOSE-CAPILLARY: 109 mg/dL — AB (ref 70–99)
Glucose-Capillary: 76 mg/dL (ref 70–99)
Glucose-Capillary: 93 mg/dL (ref 70–99)
Glucose-Capillary: 148 mg/dL — ABNORMAL HIGH (ref 70–99)
Glucose-Capillary: 136 mg/dL — ABNORMAL HIGH (ref 70–99)

## 2014-09-12 MED ORDER — ACETAMINOPHEN 325 MG PO TABS
650.0000 mg | ORAL_TABLET | ORAL | Status: DC | PRN
  Administered 2014-09-12: 650 mg via ORAL
  Filled 2014-09-12: qty 2

## 2014-09-12 MED ORDER — POLYETHYLENE GLYCOL 3350 17 G PO PACK
17.0000 g | PACK | Freq: Three times a day (TID) | ORAL | Status: DC
  Administered 2014-09-12: 17 g via ORAL
  Filled 2014-09-12 (×2): qty 1

## 2014-09-12 NOTE — Progress Notes (Signed)
PATIENT DETAILS Name: [name] Age: 81 y.o. Sex: male Date of Birth: 06-10-1923 Admit Date: 09/11/2014 Admitting Physician Etta Quill, DO TDS:[id number],XBWI Broadus John, MD  Subjective: Patient is lying comfortably in bed in good spirits, showing no signs of distress.  Minimal blood in stool today  Assessment/Plan: Principal Problem:  Lower GI Bleed:  Likely diverticular bleed.  Patient had 3 bloody stools yesterday, but states that he had a bowel movement this morning with only traces of blood.  Hemoglobin dropped yesterday from admission at 11.9 to 8.9 when he was given a 1 unit transfusion.  Hemoglobin holding steady now at 9.8.  GI consulted and believes bleed has resolved.  Increase diet to full liquids and add miralax.    Active Problems:  Acute Blood Loss anemia:secondary to above. Transfused 1 unit of PRBC yesterday, Hb stable.   Hypertension:  Moderately to well controlled.  Continue Imdur and Metoprolol.  Continue to hold Lisinopril.     Diabetes mellitus:  CBG's stable with SSI-resume Tradjenta and Amaryl on discharge.   Atrial fibrillation:  Continue rate control with Metoprolol. On Coumadin prior to admission-given Vit K on admission-given GI bleeding. Coumadin now on hold with an INR of 1.40 today. Chads2vasc-5-but anticoagulation contraindicated at this time.  Will restart coumadin once GI bleed has completely resolved.    History of colon cancer, stage III:  Pt had colon cancer in 1992 and 1993. Recent colonoscopy in Jan 2016 only showed one single benign polyp. Likely cured per DR. Granfortuna   Disposition: Remain inpatient likely another day to monitor bleeding   Antimicrobial agents  See below  Anti-infectives    None      DVT Prophylaxis: SCD's  Code Status: Full code   Family Communication Sister was present in room.  Plan was discussed with her and patient.    Procedures:  None   CONSULTS:  GI  Time spent 25  minutes-which includes 50% of the time with face-to-face with patient/ family and coordinating care related to the above assessment and plan.  MEDICATIONS: Scheduled Meds: . atorvastatin  40 mg Oral q1800  . donepezil  5 mg Oral QHS  . finasteride  5 mg Oral QHS  . insulin aspart  0-9 Units Subcutaneous 6 times per day  . isosorbide mononitrate  90 mg Oral Daily  . metoprolol succinate  100 mg Oral BID  . multivitamin-lutein  1 capsule Oral Daily  . pantoprazole  40 mg Oral Daily  . polyethylene glycol  17 g Oral TID  . ranolazine  500 mg Oral BID  . sodium chloride  3 mL Intravenous Q12H   Continuous Infusions:  PRN Meds:.loratadine, polyvinyl alcohol    PHYSICAL EXAM: Vital signs in last 24 hours: Filed Vitals:   09/11/14 1820 09/11/14 2002 09/11/14 2134 09/12/14 0616  BP: 109/42 129/76 130/53 121/28  Pulse: 80 53 53 72  Temp: 98.9 F (37.2 C) 98 F (36.7 C) 98.1 F (36.7 C) 99.1 F (37.3 C)  TempSrc: Oral Oral Oral Oral  Resp:      Height:      Weight:      SpO2: 96% 98% 99% 94%    Weight change:  Filed Weights   09/11/14 0335  Weight: 69.4 kg (153 lb)   Body mass index is 22.58 kg/(m^2).   Gen Exam: Patient is awake and alert sitting up in bed.  In no apparent distress.  Neck: Supple, No JVD.   Chest: B/L Clear. BL air movement  CVS: S1 S2 Regular, no murmurs.  Abdomen: soft, BS +, non tender, non distended. No pain on palpation.  Extremities: no edema, lower extremities warm to touch. Neurologic: Non Focal.  Skin: No Rash or erythema noted  Wounds: N/A.    Intake/Output from previous day:  Intake/Output Summary (Last 24 hours) at 09/12/14 1122 Last data filed at 09/12/14 0519  Gross per 24 hour  Intake 1497.67 ml  Output      0 ml  Net 1497.67 ml     LAB RESULTS: CBC  Recent Labs Lab 09/11/14 0345 09/11/14 0402 09/11/14 1442 09/11/14 1512 09/11/14 2341 09/12/14 0457  WBC 6.3  --  3.4* 3.4* 5.1 5.8  HGB 11.4* 11.9* 9.8* 8.9* 9.8*  9.8*  HCT 33.3* 35.0* 27.0* 25.6* 28.4* 28.6*  PLT 152  --  132* 131* 118* 124*  MCV 100.9*  --  101.5* 100.8* 95.9 97.6  MCH 34.5*  --  36.8* 35.0* 33.1 33.4  MCHC 34.2  --  36.3* 34.8 34.5 34.3  RDW 13.2  --  13.4 13.3 17.4* 17.9*  LYMPHSABS 0.7  --   --   --   --   --   MONOABS 0.7  --   --   --   --   --   EOSABS 0.1  --   --   --   --   --   BASOSABS 0.0  --   --   --   --   --     Chemistries   Recent Labs Lab 09/11/14 0402 09/12/14 0457  NA 138 136  K 3.8 3.8  CL 102 107  CO2  --  21*  GLUCOSE 113* 99  BUN 16 12  CREATININE 1.30* 1.12  CALCIUM  --  8.1*    CBG:  Recent Labs Lab 09/11/14 1625 09/11/14 2002 09/11/14 2347 09/12/14 0344 09/12/14 0654  GLUCAP 152* 128* 76 93 109*    GFR Estimated Creatinine Clearance: 43.9 mL/min (by C-G formula based on Cr of 1.12).  Coagulation profile  Recent Labs Lab 09/11/14 0345 09/11/14 1442 09/12/14 0457  INR 2.22* 1.91* 1.40    Cardiac Enzymes No results for input(s): CKMB, TROPONINI, MYOGLOBIN in the last 168 hours.  Invalid input(s): CK  Invalid input(s): POCBNP No results for input(s): DDIMER in the last 72 hours. No results for input(s): HGBA1C in the last 72 hours. No results for input(s): CHOL, HDL, LDLCALC, TRIG, CHOLHDL, LDLDIRECT in the last 72 hours. No results for input(s): TSH, T4TOTAL, T3FREE, THYROIDAB in the last 72 hours.  Invalid input(s): FREET3 No results for input(s): VITAMINB12, FOLATE, FERRITIN, TIBC, IRON, RETICCTPCT in the last 72 hours. No results for input(s): LIPASE, AMYLASE in the last 72 hours.  Urine Studies No results for input(s): UHGB, CRYS in the last 72 hours.  Invalid input(s): UACOL, UAPR, USPG, UPH, UTP, UGL, UKET, UBIL, UNIT, UROB, ULEU, UEPI, UWBC, URBC, UBAC, CAST, UCOM, BILUA  MICROBIOLOGY: Recent Results (from the past 240 hour(s))  Clostridium Difficile by PCR     Status: None   Collection Time: 09/11/14  7:39 AM  Result Value Ref Range Status   C  difficile by pcr NEGATIVE NEGATIVE Final    RADIOLOGY STUDIES/RESULTS: Dg Abd Acute W/chest  09/11/2014   CLINICAL DATA:  Hematochezia  EXAM: DG ABDOMEN ACUTE W/ 1V CHEST  COMPARISON:  None.  FINDINGS: There is no evidence of dilated bowel loops  or free intraperitoneal air. Renal collecting system calculi are evident. No ureteral calculi are evident. Heart size and mediastinal contours are within normal limits. Emphysematous and fibrotic appearing changes are present throughout both lungs without significant difference from 06/07/2014.  IMPRESSION: Negative for bowel obstruction or perforation. Bilateral nephrolithiasis. No acute cardiopulmonary disease.   Electronically Signed   By: Andreas Newport M.D.   On: 09/11/2014 04:21    Marlou Starks, [place]  PA-S  [facility] Pager:[phone]  If 7PM-7AM, please contact night-coverage [email or web address] Password [email or web address] 09/12/2014, 11:22 AM   LOS: 1 day   Attending MD note  Patient was seen, examined,treatment plan was discussed with the PA-S.  I have personally reviewed the clinical findings, lab, imaging studies and management of this patient in detail. I agree with the documentation, as recorded by the PA-S.   Patient doing better-GI bleeding seems to have slowed down considerably, did get one unit of PRBC on 5/4-Hb stable today. Follow-if no further bleeding-home 5/6. Suspect we will need hold coumadin for atleast 1 week post discharge.    [facility]

## 2014-09-12 NOTE — Progress Notes (Signed)
[facility] PROGRESS NOTE Subjective patient without further bleeding. Tolerating liquids with no pain  Objective: Vital signs in last 24 hours: Temp:  [98 F (36.7 C)-99.1 F (37.3 C)] 99.1 F (37.3 C) (05/05 0616) Pulse Rate:  [53-87] 72 (05/05 0616) Resp:  [16] 16 (05/04 1731) BP: (109-140)/(28-76) 121/28 mmHg (05/05 0616) SpO2:  [94 %-99 %] 94 % (05/05 0616) Last BM Date: 09/11/14  Intake/Output from previous day: 05/04 0701 - 05/05 0700 In: 1977.7 [P.O.:960; I.V.:357.7; Blood:660] Out: -  Intake/Output this shift:    PE: General--no acute distress  Abdomen-- soft nontender none distended  Lab Results:  Recent Labs  09/11/14 0345 09/11/14 0402 09/11/14 1442 09/11/14 1512 09/11/14 2341 09/12/14 0457  WBC 6.3  --  3.4* 3.4* 5.1 5.8  HGB 11.4* 11.9* 9.8* 8.9* 9.8* 9.8*  HCT 33.3* 35.0* 27.0* 25.6* 28.4* 28.6*  PLT 152  --  132* 131* 118* 124*   BMET  Recent Labs  09/11/14 0402 09/12/14 0457  NA 138 136  K 3.8 3.8  CL 102 107  CO2  --  21*  CREATININE 1.30* 1.12   LFT No results for input(s): PROT, AST, ALT, ALKPHOS, BILITOT, BILIDIR, IBILI in the last 72 hours. PT/INR  Recent Labs  09/11/14 0345 09/11/14 1442 09/12/14 0457  LABPROT 24.8* 22.0* 17.3*  INR 2.22* 1.91* 1.40   PANCREAS No results for input(s): LIPASE in the last 72 hours.       Studies/Results: Dg Abd Acute W/chest  09/11/2014   CLINICAL DATA:  Hematochezia  EXAM: DG ABDOMEN ACUTE W/ 1V CHEST  COMPARISON:  None.  FINDINGS: There is no evidence of dilated bowel loops or free intraperitoneal air. Renal collecting system calculi are evident. No ureteral calculi are evident. Heart size and mediastinal contours are within normal limits. Emphysematous and fibrotic appearing changes are present throughout both lungs without significant difference from 06/07/2014.  IMPRESSION: Negative for bowel obstruction or perforation. Bilateral nephrolithiasis. No acute cardiopulmonary  disease.   Electronically Signed   By: Andreas Newport M.D.   On: 09/11/2014 04:21    Medications: I have reviewed the patient's current medications.  Assessment/Plan: 1. Lower G.I. bleed. Appears to have resolved after holding anticoagulation. We'll go ahead and increase his diet today to full liquids and follow him along. Bleeding stops, to consider discharge and resuming his anticoagulation several days with follow-up by PCP. We'll add Miralax.   Hermena Swint JR,Sharetta Ricchio L 09/12/2014, 8:25 AM  Pager: [phone] If no answer or after hours call [phone]

## 2014-09-13 LAB — CBC
HCT: 26.3 % — ABNORMAL LOW (ref 39.0–52.0)
HEMOGLOBIN: 9.1 g/dL — AB (ref 13.0–17.0)
MCH: 33.1 pg (ref 26.0–34.0)
MCHC: 34.6 g/dL (ref 30.0–36.0)
MCV: 95.6 fL (ref 78.0–100.0)
PLATELETS: 115 10*3/uL — AB (ref 150–400)
RBC: 2.75 MIL/uL — AB (ref 4.22–5.81)
RDW: 17.3 % — ABNORMAL HIGH (ref 11.5–15.5)
WBC: 4.5 10*3/uL (ref 4.0–10.5)

## 2014-09-13 LAB — GLUCOSE, CAPILLARY
GLUCOSE-CAPILLARY: 220 mg/dL — AB (ref 70–99)
GLUCOSE-CAPILLARY: 95 mg/dL (ref 70–99)
Glucose-Capillary: 109 mg/dL — ABNORMAL HIGH (ref 70–99)

## 2014-09-13 MED ORDER — WARFARIN SODIUM 4 MG PO TABS: 2.0000 mg | ORAL_TABLET | Freq: Every day | ORAL | Status: DC

## 2014-09-13 NOTE — Progress Notes (Signed)
Discharge instructions gave to pt and all questions answered. 3 times attemps made to make an appointment for pt with Dr. Laurann Montana per [facility] request but failed (cannot get touch with the person who make the schedule). I leave a voice message to the office. I instruct the pt to make an appointment himself incase no one reach me back regarding to the scheduling. Pt is ready to go.

## 2014-09-13 NOTE — Discharge Summary (Addendum)
PATIENT DETAILS Name: [name] Age: 81 y.o. Sex: male Date of Birth: 07-Aug-1923 MRN: [id number]. Admitting Physician: Etta Quill, DO XFG:[id number],JIRC Broadus John, MD  Admit Date: 09/11/2014 Discharge date: 09/13/2014  Recommendations for Outpatient Follow-up:  1. Please check CBC/INR at next visit 2. Hold coumadin for 5 days from discharge date and then resume without loading dose-resume at regular dose.   PRIMARY DISCHARGE DIAGNOSIS:  Principal Problem:   BRBPR (bright red blood per rectum) Active Problems:   Hypertension   Diabetes mellitus   Atrial fibrillation   History of colon cancer, stage III      PAST MEDICAL HISTORY: Past Medical History  Diagnosis Date  . Coronary artery disease     a. s/p PCI w/ DES to mLAD 08/05/10. b. NSTEMI 08/2013 (mildly elev trop) - cath showing widely patent stent, 80% prox D1 (small vessel) with recommendation for medical management, residual 60% ostial PDA, <20% LCx, LVEF 55-65%.   . Hypertension   . Hyperlipidemia   . Barrett esophagus   . Gallstone pancreatitis 2011    a. s/p chole.  . SBO (small bowel obstruction) 2005  . Macrocytic anemia 06/25/2011  . PAF (paroxysmal atrial fibrillation)     a. failed TEE due to inability to pass probe into the esophagus in March 2013. b. On Coumadin. Was in NSR 08/2013 admission.  . Chronic anticoagulation   . Depression   . GERD (gastroesophageal reflux disease)   . Colon cancer     a. 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 b.  "graduated" from their practice 06/2013, remains free of any new disease per those notes.  . Type II diabetes mellitus   . Premature atrial contractions   . Premature ventricular contractions   . Aortic stenosis     a. Mild-mod by echo 2013.  [name] Chronic systolic CHF (congestive heart failure)     a. EF previously 35-40%. b. improved to 55-65% by cath 08/2013.  . Myelodysplastic disease     a. Suspected low-grade  myelodysplastic disease per onc  notes.  . Other pancytopenia 06/18/2014    Mild, fluctuating, likely med related. Rule out early MDS from prior chemo/RT or colon cancer    DISCHARGE MEDICATIONS: Current Discharge Medication List    CONTINUE these medications which have CHANGED   Details  warfarin (COUMADIN) 4 MG tablet Take 0.5-1 tablets (2-4 mg total) by mouth daily. Take 2 mg on Tues / Thurs ** Take 4 mg on Sun / Mon / Wed / Fri / Sat  START ON 09/18/14-Wednesday-MAKE SURE YOU GO TO YOUR PRIMARY DOCTOR'S OFFICE FOR CBC/INR CHECK WITHIN 1 WEEK      CONTINUE these medications which have NOT CHANGED   Details  atorvastatin (LIPITOR) 40 MG tablet Take 1 tablet (40 mg total) by mouth daily at 6 PM. Qty: 90 tablet, Refills: 3    Cholecalciferol (VITAMIN D-3 PO) Take 1 tablet by mouth daily.     donepezil (ARICEPT) 5 MG tablet Take 5 mg by mouth at bedtime.     finasteride (PROSCAR) 5 MG tablet Take 5 mg by mouth at bedtime.     glimepiride (AMARYL) 4 MG tablet Take 4 mg by mouth daily after breakfast.     isosorbide mononitrate (IMDUR) 60 MG 24 hr tablet Take 1.5 tablets (90 mg total) by mouth daily. Qty: 90 tablet, Refills: 3    linagliptin (TRADJENTA) 5 MG TABS tablet Take 5 mg by mouth daily. For blood sugar  lisinopril (PRINIVIL,ZESTRIL) 10 MG tablet Take 1 tablet (10 mg total) by mouth daily. Qty: 90 tablet, Refills: 3    loratadine (CLARITIN) 10 MG tablet Take 10 mg by mouth daily as needed for allergies.    Melatonin (RA MELATONIN) 10 MG TABS Take 10 mg by mouth at bedtime.     metoprolol succinate (TOPROL-XL) 100 MG 24 hr tablet Take 100 mg by mouth 2 (two) times daily at 10 AM and 5 PM. Take with or immediately following a meal.    Multiple Vitamins-Minerals (PRESERVISION/LUTEIN) CAPS Take 1 capsule by mouth daily.  Refills: 0    nitroGLYCERIN (NITROSTAT) 0.4 MG SL tablet Place 1 tablet (0.4 mg total) under the tongue every 5 (five) minutes as needed for chest pain. Qty: 25 tablet, Refills: 5      omeprazole (PRILOSEC) 20 MG capsule Take 20 mg by mouth daily.    Polyethyl Glycol-Propyl Glycol (SYSTANE OP) Place 1 drop into both eyes as directed. 2-4 times a day    ranolazine (RANEXA) 500 MG 12 hr tablet Take 1 tablet (500 mg total) by mouth 2 (two) times daily. Qty: 60 tablet, Refills: 5      STOP taking these medications     aspirin EC 81 MG tablet         ALLERGIES:   Allergies  Allergen Reactions  . Metformin And Related Nausea And Vomiting    BRIEF HPI:  See H&P, Labs, Consult and Test reports for all details in brief, patient was admitted for evaluation of painless hematochezia.  CONSULTATIONS:   GI  PERTINENT RADIOLOGIC STUDIES: Dg Abd Acute W/chest  09/11/2014   CLINICAL DATA:  Hematochezia  EXAM: DG ABDOMEN ACUTE W/ 1V CHEST  COMPARISON:  None.  FINDINGS: There is no evidence of dilated bowel loops or free intraperitoneal air. Renal collecting system calculi are evident. No ureteral calculi are evident. Heart size and mediastinal contours are within normal limits. Emphysematous and fibrotic appearing changes are present throughout both lungs without significant difference from 06/07/2014.  IMPRESSION: Negative for bowel obstruction or perforation. Bilateral nephrolithiasis. No acute cardiopulmonary disease.   Electronically Signed   By: Andreas Newport M.D.   On: 09/11/2014 04:21     PERTINENT LAB RESULTS: CBC:  Recent Labs  09/12/14 1429 09/13/14 0438  WBC 6.8 4.5  HGB 9.9* 9.1*  HCT 27.6* 26.3*  PLT 121* 115*   CMET CMP     Component Value Date/Time   NA 136 09/12/2014 0457   NA 140 06/15/2013 1557   K 3.8 09/12/2014 0457   K 4.3 06/15/2013 1557   CL 107 09/12/2014 0457   CO2 21* 09/12/2014 0457   CO2 25 06/15/2013 1557   GLUCOSE 99 09/12/2014 0457   GLUCOSE 146* 06/15/2013 1557   BUN 12 09/12/2014 0457   BUN 21.2 06/15/2013 1557   CREATININE 1.12 09/12/2014 0457   CREATININE 1.1 06/15/2013 1557   CREATININE 1.44 07/28/2010 1745    CREATININE 1.54* 06/22/2010 1053   CALCIUM 8.1* 09/12/2014 0457   CALCIUM 9.4 06/15/2013 1557   PROT 5.9* 06/07/2014 2127   PROT 6.8 06/15/2013 1557   ALBUMIN 3.0* 06/07/2014 2127   ALBUMIN 3.7 06/15/2013 1557   AST 35 06/07/2014 2127   AST 22 06/15/2013 1557   ALT 27 06/07/2014 2127   ALT 18 06/15/2013 1557   ALKPHOS 40 06/07/2014 2127   ALKPHOS 48 06/15/2013 1557   BILITOT 0.6 06/07/2014 2127   BILITOT 0.37 06/15/2013 1557   GFRNONAA 56* 09/12/2014  0457   GFRAA >60 09/12/2014 0457    GFR Estimated Creatinine Clearance: 43.9 mL/min (by C-G formula based on Cr of 1.12). No results for input(s): LIPASE, AMYLASE in the last 72 hours. No results for input(s): CKTOTAL, CKMB, CKMBINDEX, TROPONINI in the last 72 hours. Invalid input(s): POCBNP No results for input(s): DDIMER in the last 72 hours. No results for input(s): HGBA1C in the last 72 hours. No results for input(s): CHOL, HDL, LDLCALC, TRIG, CHOLHDL, LDLDIRECT in the last 72 hours. No results for input(s): TSH, T4TOTAL, T3FREE, THYROIDAB in the last 72 hours.  Invalid input(s): FREET3 No results for input(s): VITAMINB12, FOLATE, FERRITIN, TIBC, IRON, RETICCTPCT in the last 72 hours. Coags:  Recent Labs  09/11/14 1442 09/12/14 0457  INR 1.91* 1.40   Microbiology: Recent Results (from the past 240 hour(s))  Clostridium Difficile by PCR     Status: None   Collection Time: 09/11/14  7:39 AM  Result Value Ref Range Status   C difficile by pcr NEGATIVE NEGATIVE Final     BRIEF HOSPITAL COURSE:  Lower GI Bleed: Likely diverticular bleed.Admitted and provided supportive care, did require one unit of PRBC during this hospitalization. GI was consulted. Thankfully bleeding now has resolved, no significant bleeding since yesterday. This MD spoke with Dr Kittie Plater recommended discharge. He suggested to resume coumadin 5 days post discharge.This was discussed with the patient in detail, he was asked to make sure he goes to  his PCP's office within a week for a follow up visit and to check INR/CBC.I have also discontinued ASA-patient has no hx of CVA or CAD.   Active Problems: Acute Blood Loss anemia:secondary to above. Transfused 1 unit of PRBC this admit, Hb stable at 9.1 on discharge.  Hypertension: Well controlled. Continue Imdur. Lisinopril and Metoprolol on discharge  Diabetes mellitus: CBG's stable with SSI while inpatient-resume Tradjenta and Amaryl on discharge.  Atrial fibrillation: Continue rate control with Metoprolol. On Coumadin prior to admission-given Vit K on admission-given GI bleeding. Coumadin held on admission.Recommendations from GI are to resume coumadin 5 days post discharge. Chads2vasc-5-patient aware of stroke risk-hopefully we will be able to resume coumadin in the next 5 days without any adverse effects. Patient was asked to follow up with his PCP within 1 week for a hospital discharge follow up including lab draw for CBC and INR.  History of colon cancer, stage III: Pt had colon cancer in 1992 and 1993. Recent colonoscopy in Jan 2016 only showed one single benign polyp. Likely cured per DR. Granfortuna's last note  TODAY-DAY OF DISCHARGE:  Subjective:   Pearce Littlefield today has no headache,no chest abdominal pain,no new weakness tingling or numbness, feels much better wants to go home today. No bleeding today-stools were brown in color  Objective:   Blood pressure 120/36, pulse 86, temperature 98.2 F (36.8 C), temperature source Oral, resp. rate 17, height 5\' 9"  (1.753 m), weight 69.4 kg (153 lb), SpO2 95 %.  Intake/Output Summary (Last 24 hours) at 09/13/14 1428 Last data filed at 09/13/14 0900  Gross per 24 hour  Intake    480 ml  Output      0 ml  Net    480 ml   Filed Weights   09/11/14 0335  Weight: 69.4 kg (153 lb)    Exam Awake Alert, Oriented *3, No new F.N deficits, Normal affect [place].AT,PERRAL Supple Neck,No JVD, No cervical lymphadenopathy  appriciated.  Symmetrical Chest wall movement, Good air movement bilaterally, CTAB RRR,No Gallops,Rubs or new Murmurs,  No Parasternal Heave +ve B.Sounds, Abd Soft, Non tender, No organomegaly appriciated, No rebound -guarding or rigidity. No Cyanosis, Clubbing or edema, No new Rash or bruise  DISCHARGE CONDITION: Stable  DISPOSITION: Home  DISCHARGE INSTRUCTIONS:    Activity:  As tolerated   Diet recommendation: Diabetic Diet Heart Healthy diet  Discharge Instructions    Call MD for:    Complete by:  As directed   BLOOD IN STOOL     Diet - low sodium heart healthy    Complete by:  As directed      Diet Carb Modified    Complete by:  As directed      Increase activity slowly    Complete by:  As directed            Follow-up Information    Follow up with Irven Shelling, MD. Go on 09/20/2014.   Specialty:  Internal Medicine   Why:  Follow Up Appointment at 1:15 PM   Contact information:   [address] [place] [address] [phone]      Total Time spent on discharge equals 45 minutes.  SignedOren Binet 09/13/2014 2:28 PM

## 2014-10-04 ENCOUNTER — Other Ambulatory Visit: Payer: Self-pay

## 2014-10-04 ENCOUNTER — Ambulatory Visit: Payer: Medicare Other | Admitting: Nurse Practitioner

## 2014-10-16 ENCOUNTER — Telehealth: Payer: Self-pay | Admitting: Cardiovascular Disease

## 2014-10-16 NOTE — Telephone Encounter (Signed)
New problem:  Pt called said he needs an appt. Sooner than Aug.2016.  His Angina is acting up and would like to be seen by only him.  I offered PA / while Dr is in there office but pt does not want this.   Please give him a call back with a appt.

## 2014-10-16 NOTE — Telephone Encounter (Signed)
Spoke with patient who states he has angina which is annoying him and he needs to see Dr. [name] before August.  He reports compliance with his medications; states he takes the NTG as needed, but not that frequently.  He states he needs to talk to Dr. [name] in person because he doesn't know what to do to make the pain better.  I advised him to take the NTG for intermittent pain along with the Ranexa and Imdur and that per Dr. [name], if pain is unrelenting to go to the ER.  I scheduled patient for an appointment with Dr. [name] Tuesday 6/21.  He verbalized understanding and thanked me for my help.

## 2014-10-22 ENCOUNTER — Other Ambulatory Visit: Payer: Self-pay | Admitting: Cardiovascular Disease

## 2014-10-29 ENCOUNTER — Encounter: Payer: Self-pay | Admitting: Cardiovascular Disease

## 2014-10-29 ENCOUNTER — Ambulatory Visit (INDEPENDENT_AMBULATORY_CARE_PROVIDER_SITE_OTHER): Payer: Medicare Other | Admitting: Cardiovascular Disease

## 2014-10-29 VITALS — BP 98/48 | HR 72 | Ht 69.0 in | Wt 148.4 lb

## 2014-10-29 DIAGNOSIS — I214 Non-ST elevation (NSTEMI) myocardial infarction: Secondary | ICD-10-CM

## 2014-10-29 DIAGNOSIS — E785 Hyperlipidemia, unspecified: Secondary | ICD-10-CM

## 2014-10-29 DIAGNOSIS — I480 Paroxysmal atrial fibrillation: Secondary | ICD-10-CM

## 2014-10-29 DIAGNOSIS — I5022 Chronic systolic (congestive) heart failure: Secondary | ICD-10-CM | POA: Diagnosis not present

## 2014-10-29 DIAGNOSIS — R079 Chest pain, unspecified: Secondary | ICD-10-CM | POA: Diagnosis not present

## 2014-10-29 DIAGNOSIS — I10 Essential (primary) hypertension: Secondary | ICD-10-CM

## 2014-10-29 DIAGNOSIS — I2510 Atherosclerotic heart disease of native coronary artery without angina pectoris: Secondary | ICD-10-CM

## 2014-10-29 MED ORDER — RANOLAZINE ER 500 MG PO TB12: 500.0000 mg | ORAL_TABLET | Freq: Two times a day (BID) | ORAL | Status: DC

## 2014-10-29 NOTE — Patient Instructions (Addendum)
Medication Instructions:  Call Christen Bame, RN [phone] with your medications when you look at your pill bottles  Labwork: None Ordered  Testing/Procedures: None Ordered  Follow-Up: Your physician wants you to follow-up in: 6 months with Dr. [name].  You will receive a reminder letter in the mail two months in advance. If you don't receive a letter, please call our office to schedule the follow-up appointment.

## 2014-10-29 NOTE — Progress Notes (Signed)
Cardiology Office Note   Date:  10/29/2014   ID:  [name], DOB April 11, 1924, MRN [id number]  PCP:  Irven Shelling, MD  Cardiologist:   Thayer Headings, MD   Chief Complaint  Patient presents with  . Follow-up    GI bleed , CAD   1. CAD - s/p stenting July 2012.  2. Hypertension  3. Hyperlipidemia  4. Diabetes Mellitus  5. Paroxysmal atrial fib.   6. Lower GI bleed -    History of Present Illness:  [name] is seen today for a 2 week check. He has known CAD with prior PCI to the LAD last year. Other issues include chronic PVC's and PACs, HTN, DM, HLD and systolic heart failure. He now has atrial fib and will be managed with rate control and anticoagulation. He had a failed attempt at [facility] in March due to inability to pass the probe into the esophagus. His EF is 35 to 40% with mild to moderate AS.  His INR has been therapeutic. He really is not interested in doing cardioversion.  December 23, 2011 - he was initially seen in the emergency room. He has progressive back pain associated with increasing shortness of breath. [name] noticed that if he takes a nitroglycerin at this back pain and his shortness of breath improved. He has these discomforts with minor levels of exertion. He is also having at rest.  March 20, 2012: [name] presented with some episodes of chest pain when I last saw him in August. He had a stress Myoview study that was normal. He still walks almost every day - he goes around [employer] 1-1 1/2 miles a day. He had a brief episode of CP yesterday - relieved with NTG. He had some food poisoning recently ( raw oysters)  Oct 03, 2012:  He has been having some mid abdominal pain with radiation around to the back. He took a NTG with relief. The pain was a dull pain and would last 30 minutes or so. He has been putting in his garden without any without any problems.   Oct. 1, 2014:  [name] is doing ok. He is having problems with his  memory. Has trouble getting the words out sometimes. No CVA symptoms. No focal neuro findings.  No  CP or dyspnea. Still working out in his garden and with the wildlife club at [facility].   August 10, 2013:  [name] has been having some chest tightness . Occurs 1-2 times a week. The chest tightness is relieved if he takes a nitroglycerin. he also crushes up a full strength aspirin and takes that. The pains occur with rest. Not associated with eating Or drinking anything. Similar to his previous epidoses of CP but are not as severe.  December 24, 2013:  [name] has been hospitalized since I last saw him. He had some chest pain and a followup cardiac catheterization revealed that his stents were okay. [name] been on Ranexa but complains of the cost is a bit high. The Ranexa seems to be helping quite a bit.    Feb. 18, 2016:    [name] is a 81 y.o. male who presents for follow up of his CAD [place] great.  Has been feeling better on the Ranexa.  Wants to cut the Ranexa to every other day.   Enjoying life.  Not exercising much these days.  Gets his INR levels drawn at his primary medical doctors office.    Past Medical History  Diagnosis  Date  . Coronary artery disease     a. s/p PCI w/ DES to mLAD 08/05/10. b. NSTEMI 08/2013 (mildly elev trop) - cath showing widely patent stent, 80% prox D1 (small vessel) with recommendation for medical management, residual 60% ostial PDA, <20% LCx, LVEF 55-65%.   . Hypertension   . Hyperlipidemia   . Barrett esophagus   . Gallstone pancreatitis 2011    a. s/p chole.  . SBO (small bowel obstruction) 2005  . Macrocytic anemia 06/25/2011  . PAF (paroxysmal atrial fibrillation)     a. failed TEE due to inability to pass probe into the esophagus in March 2013. b. On Coumadin. Was in NSR 08/2013 admission.  . Chronic anticoagulation   . Depression   . GERD (gastroesophageal reflux disease)   . Colon cancer     a. Metastatic to  mesenteric lymph nodes per onc notes. b.  "graduated" from their practice 06/2013, remains free of any new disease per those notes.  . Type II diabetes mellitus   . Premature atrial contractions   . Premature ventricular contractions   . Aortic stenosis     a. Mild-mod by echo 2013.  [name] Chronic systolic CHF (congestive heart failure)     a. EF previously 35-40%. b. improved to 55-65% by cath 08/2013.  . Myelodysplastic disease     a. Suspected low-grade  myelodysplastic disease per onc notes.  . Other pancytopenia 06/18/2014    Mild, fluctuating, likely med related. Rule out early MDS from prior chemo/RT or colon cancer    Past Surgical History  Procedure Laterality Date  . Cholecystectomy  2011  . Right colectomy    . Coronary angioplasty with stent placement  08/05/10    DES to the LAD  . Cardiac catheterization  08/03/10  . Cardiac catheterization  08/09/10    LAD 30, stent OK, CFX 40, RCA < 20  . Abdominal mass resection      Mass near mesentery  . Cystoscopy      "with removal of kidney stone in office"  . Cataract extraction Bilateral   . Appendectomy      "took out during colon surgery"  . Cystoscopy with retrograde pyelogram, ureteroscopy and stent placement Bilateral 05/18/2013    Procedure: CYSTOSCOPY WITH BILATERAL RETROGRADE PYELOGRAM, LEFT URETEROSCOPY AND LEFT STENT PLACEMENT;  Surgeon: Alexis Frock, MD;  Location: [facility];  Service: Urology;  Laterality: Bilateral;  . Colon surgery    . Tonsillectomy    . Left heart catheterization with coronary angiogram N/A 08/15/2013    Procedure: LEFT HEART CATHETERIZATION WITH CORONARY ANGIOGRAM;  Surgeon: Peter M Martinique, MD;  Location: [facility];  Service: Cardiovascular;  Laterality: N/A;     Current Outpatient Prescriptions  Medication Sig Dispense Refill  . atorvastatin (LIPITOR) 40 MG tablet Take 1 tablet (40 mg total) by mouth daily at 6 PM. 90 tablet 3  . Cholecalciferol (VITAMIN D-3 PO) Take 1 tablet by mouth daily.     [name]  donepezil (ARICEPT) 5 MG tablet Take 5 mg by mouth at bedtime.     . finasteride (PROSCAR) 5 MG tablet Take 5 mg by mouth at bedtime.     [name] glimepiride (AMARYL) 4 MG tablet Take 4 mg by mouth daily after breakfast.     . isosorbide mononitrate (IMDUR) 60 MG 24 hr tablet TAKE ONE AND ONE-HALF TABLETS BY MOUTH TWICE DAILY 90 tablet 2  . linagliptin (TRADJENTA) 5 MG TABS tablet Take 5 mg by mouth daily. For  blood sugar    . lisinopril (PRINIVIL,ZESTRIL) 10 MG tablet Take 1 tablet (10 mg total) by mouth daily. 90 tablet 3  . loratadine (CLARITIN) 10 MG tablet Take 10 mg by mouth daily as needed for allergies.    . Melatonin (RA MELATONIN) 10 MG TABS Take 10 mg by mouth at bedtime.     . metoprolol succinate (TOPROL-XL) 100 MG 24 hr tablet Take 100 mg by mouth 2 (two) times daily at 10 AM and 5 PM. Take with or immediately following a meal.    . Multiple Vitamins-Minerals (PRESERVISION/LUTEIN) CAPS Take 1 capsule by mouth daily.   0  . nitroGLYCERIN (NITROSTAT) 0.4 MG SL tablet Place 1 tablet (0.4 mg total) under the tongue every 5 (five) minutes as needed for chest pain. 25 tablet 5  . omeprazole (PRILOSEC) 20 MG capsule Take 20 mg by mouth daily.    [name] Glycol-Propyl Glycol (SYSTANE OP) Place 1 drop into both eyes as directed. 2-4 times a day    . ranolazine (RANEXA) 500 MG 12 hr tablet Take 1 tablet (500 mg total) by mouth 2 (two) times daily. 60 tablet 5  . warfarin (COUMADIN) 4 MG tablet Take 0.5-1 tablets (2-4 mg total) by mouth daily. Take 2 mg on Tues / Thurs ** Take 4 mg on Sun / Mon / Wed / Fri / Sat  START ON 09/18/14-Wednesday-MAKE SURE YOU GO TO YOUR PRIMARY DOCTOR'S OFFICE FOR CBC/INR CHECK WITHIN 1 WEEK     No current facility-administered medications for this visit.    Allergies:   Metformin and related    Social History:  The patient  reports that he quit smoking about 20 years ago. His smoking use included Cigarettes. He has a 30 pack-year smoking history. He has never  used smokeless tobacco. He reports that he drinks about 1.2 oz of alcohol per week. He reports that he does not use illicit drugs.   Family History:  The patient's family history includes Cancer in his mother; Heart attack in his brother; Ulcers in his father.    ROS:  Please see the history of present illness.    Review of Systems: Constitutional:  denies fever, chills, diaphoresis, appetite change and fatigue.  HEENT: denies photophobia, eye pain, redness, hearing loss, ear pain, congestion, sore throat, rhinorrhea, sneezing, neck pain, neck stiffness and tinnitus.  Respiratory: denies SOB, DOE, cough, chest tightness, and wheezing.  Cardiovascular: denies chest pain, palpitations and leg swelling.  Gastrointestinal: denies nausea, vomiting, abdominal pain, diarrhea, constipation, blood in stool.  Genitourinary: denies dysuria, urgency, frequency, hematuria, flank pain and difficulty urinating.  Musculoskeletal: denies  myalgias, back pain, joint swelling, arthralgias and gait problem.   Skin: denies pallor, rash and wound.  Neurological: denies dizziness, seizures, syncope, weakness, light-headedness, numbness and headaches.   Hematological: denies adenopathy, easy bruising, personal or family bleeding history.  Psychiatric/ Behavioral: denies suicidal ideation, mood changes, confusion, nervousness, sleep disturbance and agitation.       All other systems are reviewed and negative.    PHYSICAL EXAM: VS:  BP 98/48 mmHg  Pulse 72  Ht 5\' 9"  (1.753 m)  Wt 67.314 kg (148 lb 6.4 oz)  BMI 21.90 kg/m2 , BMI Body mass index is 21.9 kg/(m^2). GEN: Well nourished, well developed, in no acute distress HEENT: normal Neck: no JVD, carotid bruits, or masses Cardiac: RRR; no murmurs, rubs, or gallops,no edema  Respiratory:  clear to auscultation bilaterally, normal work of breathing GI: soft, nontender, nondistended, + BS  MS: no deformity or atrophy Skin: warm and dry, no rash Neuro:   Strength and sensation are intact Psych: normal   EKG:  EKG is ordered today.  NSR at 72.  RBBB. No ST or T wave changs.     Recent Labs: 06/07/2014: ALT 27 06/08/2014: TSH 1.831 09/12/2014: BUN 12; Creatinine, Ser 1.12; Potassium 3.8; Sodium 136 09/13/2014: Hemoglobin 9.1*; Platelets 115*    Lipid Panel    Component Value Date/Time   CHOL 119 08/07/2013 0946   TRIG 50.0 08/07/2013 0946   HDL 39.50 08/07/2013 0946   CHOLHDL 3 08/07/2013 0946   VLDL 10.0 08/07/2013 0946   LDLCALC 70 08/07/2013 0946      Wt Readings from Last 3 Encounters:  10/29/14 67.314 kg (148 lb 6.4 oz)  09/11/14 69.4 kg (153 lb)  06/27/14 70.761 kg (156 lb)      Other studies Reviewed: Additional studies/ records that were reviewed today include: . Review of the above records demonstrates:    ASSESSMENT AND PLAN:  1. CAD - s/p stenting July 2012.  - he was recently admitted to the hospital with some atypical chest pain. He was started on Ranexa and has done very well. [name] not had any further episodes of pain.  He still has occasional episodes of angina. [name] on Ranexa. I think that his angina will improve if we get his hemoglobin better.  2. Hypertension - pressure is well-controlled.  He has both metoprolol and carvedilol listed on his med list. He will go home and see which of these medications he's actually taking.  3. Hyperlipidemia - we'll check fasting lipids at his next office visit.  4. Diabetes Mellitus   5. Lower GI bleed. He was admitted recently with a lower GI bleed. He is on Coumadin for paroxysmal atrial fibrillation. His INR levels have been monitored by his primary medical doctor. I have suggested that he try taking Eliquis instead of Coumadin. This may cause less GI bleeding.   Current medicines are reviewed at length with the patient today.  The patient does not have concerns regarding medicines.  The following changes have been made:  no change   Disposition:   FU with me  in 6 months     Signed, Waldron Gerry, Wonda Cheng, MD  10/29/2014 9:40 AM    [facility] [place], [place], [place]  [address] Phone: [phone]; Fax: [phone]

## 2014-10-30 ENCOUNTER — Telehealth: Payer: Self-pay | Admitting: Nurse Practitioner

## 2014-10-30 NOTE — Telephone Encounter (Signed)
Left message for patient to call office tomorrow as directed at office visit yesterday with Dr. [name] to let me know if he is taking Carvedilol or Metoprolol as patient had both medications listed.

## 2014-11-01 MED ORDER — CARVEDILOL 25 MG PO TABS: 25.0000 mg | ORAL_TABLET | Freq: Two times a day (BID) | ORAL | Status: DC

## 2014-11-01 NOTE — Telephone Encounter (Signed)
Patient left message on refill line that he takes Carvedilol and does not take Metoprolol.  Medication list updated to reflect.

## 2014-11-11 ENCOUNTER — Other Ambulatory Visit: Payer: Self-pay | Admitting: Cardiovascular Disease

## 2014-11-12 ENCOUNTER — Other Ambulatory Visit: Payer: Self-pay

## 2014-11-12 MED ORDER — ATORVASTATIN CALCIUM 40 MG PO TABS: 40.0000 mg | ORAL_TABLET | Freq: Every day | ORAL | Status: DC

## 2014-11-17 ENCOUNTER — Other Ambulatory Visit: Payer: Self-pay | Admitting: Cardiovascular Disease

## 2014-11-19 ENCOUNTER — Other Ambulatory Visit: Payer: Self-pay | Admitting: Cardiovascular Disease

## 2014-12-27 ENCOUNTER — Ambulatory Visit: Payer: Medicare Other | Admitting: Cardiovascular Disease

## 2015-01-31 ENCOUNTER — Ambulatory Visit (INDEPENDENT_AMBULATORY_CARE_PROVIDER_SITE_OTHER): Payer: Medicare Other | Admitting: Cardiovascular Disease

## 2015-01-31 ENCOUNTER — Other Ambulatory Visit: Payer: Self-pay | Admitting: Cardiovascular Disease

## 2015-01-31 ENCOUNTER — Encounter: Payer: Self-pay | Admitting: Cardiovascular Disease

## 2015-01-31 VITALS — BP 110/48 | HR 85 | Ht 69.0 in | Wt 154.1 lb

## 2015-01-31 DIAGNOSIS — I2510 Atherosclerotic heart disease of native coronary artery without angina pectoris: Secondary | ICD-10-CM

## 2015-01-31 DIAGNOSIS — I10 Essential (primary) hypertension: Secondary | ICD-10-CM

## 2015-01-31 NOTE — Progress Notes (Signed)
Cardiology Office Note   Date:  01/31/2015   ID:  [name], DOB December 21, 1923, MRN [id number]  PCP:  Irven Shelling, MD  Cardiologist:   Thayer Headings, MD   Chief Complaint  Patient presents with  . Follow-up   1. CAD - s/p stenting July 2012.  2. Hypertension  3. Hyperlipidemia  4. Diabetes Mellitus  5. Paroxysmal atrial fib.   6. Lower GI bleed -    History of Present Illness:  [name] is seen today for a 2 week check. He has known CAD with prior PCI to the LAD last year. Other issues include chronic PVC's and PACs, HTN, DM, HLD and systolic heart failure. He now has atrial fib and will be managed with rate control and anticoagulation. He had a failed attempt at [facility] in March due to inability to pass the probe into the esophagus. His EF is 35 to 40% with mild to moderate AS.  His INR has been therapeutic. He really is not interested in doing cardioversion.  December 23, 2011 - he was initially seen in the emergency room. He has progressive back pain associated with increasing shortness of breath. [name] noticed that if he takes a nitroglycerin at this back pain and his shortness of breath improved. He has these discomforts with minor levels of exertion. He is also having at rest.  March 20, 2012: [name] presented with some episodes of chest pain when I last saw him in August. He had a stress Myoview study that was normal. He still walks almost every day - he goes around [employer] 1-1 1/2 miles a day. He had a brief episode of CP yesterday - relieved with NTG. He had some food poisoning recently ( raw oysters)  Oct 03, 2012:  He has been having some mid abdominal pain with radiation around to the back. He took a NTG with relief. The pain was a dull pain and would last 30 minutes or so. He has been putting in his garden without any without any problems.   Oct. 1, 2014:  [name] is doing ok. He is having problems with his memory. Has trouble  getting the words out sometimes. No CVA symptoms. No focal neuro findings.  No  CP or dyspnea. 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   August 10, 2013:  [name] has been having some chest tightness . Occurs 1-2 times a week. The chest tightness is relieved if he takes a nitroglycerin. he also crushes up a full strength aspirin and takes that. The pains occur with rest. Not associated with eating Or drinking anything. Similar to his previous epidoses of CP but are not as severe.  December 24, 2013:  [name] has been hospitalized since I last saw him. He had some chest pain and a followup cardiac catheterization revealed that his stents were okay. [name] been on Ranexa but complains of the cost is a bit high. The Ranexa seems to be helping quite a bit.    Feb. 18, 2016:    [name] is a 81 y.o. male who presents for follow up of his CAD Fells great.  Has been feeling better on the Ranexa.  Wants to cut the Ranexa to every other day.   Enjoying life.  Not exercising much these days.  Gets his INR levels drawn at his primary medical doctors office.   Sept. 23, 2016: [name] is feeling great. No CP No dyspnea  BP  has been well controlled   Had some bloody stools  - has resolved.    Past Medical History  Diagnosis Date  . Coronary artery disease     a. s/p PCI w/ DES to mLAD 08/05/10. b. NSTEMI 08/2013 (mildly elev trop) - cath showing widely patent stent, 80% prox D1 (small vessel) with recommendation for medical management, residual 60% ostial PDA, <20% LCx, LVEF 55-65%.   . Hypertension   . Hyperlipidemia   . Barrett esophagus   . Gallstone pancreatitis 2011    a. s/p chole.  . SBO (small bowel obstruction) 2005  . Macrocytic anemia 06/25/2011  . PAF (paroxysmal atrial fibrillation)     a. failed TEE due to inability to pass probe into the esophagus in March 2013. b. On Coumadin. Was in NSR 08/2013 admission.  . Chronic  anticoagulation   . Depression   . GERD (gastroesophageal reflux disease)   . Colon cancer     a. 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 b.  "graduated" from their practice 06/2013, remains free of any new disease per those notes.  . Type II diabetes mellitus   . Premature atrial contractions   . Premature ventricular contractions   . Aortic stenosis     a. Mild-mod by echo 2013.  [name] Chronic systolic CHF (congestive heart failure)     a. EF previously 35-40%. b. improved to 55-65% by cath 08/2013.  . Myelodysplastic disease     a. Suspected low-grade  myelodysplastic disease per onc notes.  . Other pancytopenia 06/18/2014    Mild, fluctuating, likely med related. Rule out early MDS from prior chemo/RT or colon cancer    Past Surgical History  Procedure Laterality Date  . Cholecystectomy  2011  . Right colectomy    . Coronary angioplasty with stent placement  08/05/10    DES to the LAD  . Cardiac catheterization  08/03/10  . Cardiac catheterization  08/09/10    LAD 30, stent OK, CFX 40, RCA < 20  . Abdominal mass resection      Mass near mesentery  . Cystoscopy      "with removal of kidney stone in office"  . Cataract extraction Bilateral   . Appendectomy      "took out during colon surgery"  . Cystoscopy with retrograde pyelogram, ureteroscopy and stent placement Bilateral 05/18/2013    Procedure: CYSTOSCOPY WITH BILATERAL RETROGRADE PYELOGRAM, LEFT URETEROSCOPY AND LEFT STENT PLACEMENT;  Surgeon: Alexis Frock, MD;  Location: [facility] ORS;  Service: Urology;  Laterality: Bilateral;  . Colon surgery    . Tonsillectomy    . Left heart catheterization with coronary angiogram N/A 08/15/2013    Procedure: LEFT HEART CATHETERIZATION WITH CORONARY ANGIOGRAM;  Surgeon: Peter M Martinique, MD;  Location: [facility] CATH LAB;  Service: Cardiovascular;  Laterality: N/A;     Current Outpatient Prescriptions  Medication Sig Dispense Refill  . atorvastatin (LIPITOR) 40 MG tablet Take 1 tablet (40 mg  total) by mouth daily at 6 PM. 90 tablet 0  . carvedilol (COREG) 25 MG tablet Take 1 tablet (25 mg total) by mouth 2 (two) times daily. 180 tablet 3  . carvedilol (COREG) 25 MG tablet TAKE ONE TABLET BY MOUTH TWICE DAILY WITH FOOD 60 tablet 6  . Cholecalciferol (VITAMIN D-3 PO) Take 1 tablet by mouth daily.     [name] donepezil (ARICEPT) 5 MG tablet Take 5 mg by mouth at bedtime.     . finasteride (PROSCAR) 5 MG tablet Take 5  mg by mouth at bedtime.     [name] glimepiride (AMARYL) 4 MG tablet Take 4 mg by mouth daily after breakfast.     . isosorbide mononitrate (IMDUR) 60 MG 24 hr tablet TAKE ONE AND ONE-HALF TABLETS BY MOUTH TWICE DAILY 90 tablet 2  . linagliptin (TRADJENTA) 5 MG TABS tablet Take 5 mg by mouth daily. For blood sugar    . lisinopril (PRINIVIL,ZESTRIL) 5 MG tablet Take 5 mg by mouth daily.    [name] loratadine (CLARITIN) 10 MG tablet Take 10 mg by mouth daily as needed for allergies.    . Melatonin (RA MELATONIN) 10 MG TABS Take 10 mg by mouth at bedtime.     . Multiple Vitamins-Minerals (PRESERVISION/LUTEIN) CAPS Take 1 capsule by mouth daily.   0  . nitroGLYCERIN (NITROSTAT) 0.4 MG SL tablet Place 1 tablet (0.4 mg total) under the tongue every 5 (five) minutes as needed for chest pain. 25 tablet 5  . omeprazole (PRILOSEC) 20 MG capsule Take 20 mg by mouth daily.    Vladimir Faster Glycol-Propyl Glycol (SYSTANE OP) Place 1 drop into both eyes as directed. 2-4 times a day    . ranolazine (RANEXA) 500 MG 12 hr tablet Take 1 tablet (500 mg total) by mouth 2 (two) times daily. 60 tablet 11  . warfarin (COUMADIN) 4 MG tablet Take 0.5-1 tablets (2-4 mg total) by mouth daily. Take 2 mg on Tues / Thurs ** Take 4 mg on Sun / Mon / Wed / Fri / Sat  START ON 09/18/14-Wednesday-MAKE SURE YOU GO TO YOUR PRIMARY DOCTOR'S OFFICE FOR CBC/INR CHECK WITHIN 1 WEEK     No current facility-administered medications for this visit.    Allergies:   Metformin and related    Social History:  The patient  reports that  he quit smoking about 20 years ago. His smoking use included Cigarettes. He has a 30 pack-year smoking history. He has never used smokeless tobacco. He reports that he drinks about 1.2 oz of alcohol per week. He reports that he does not use illicit drugs.   Family History:  The patient's family history includes Cancer in his mother; Heart attack in his brother; Ulcers in his father.    ROS:  Please see the history of present illness.    Review of Systems: Constitutional:  denies fever, chills, diaphoresis, appetite change and fatigue.  HEENT: denies photophobia, eye pain, redness, hearing loss, ear pain, congestion, sore throat, rhinorrhea, sneezing, neck pain, neck stiffness and tinnitus.  Respiratory: denies SOB, DOE, cough, chest tightness, and wheezing.  Cardiovascular: denies chest pain, palpitations and leg swelling.  Gastrointestinal: denies nausea, vomiting, abdominal pain, diarrhea, constipation, blood in stool.  Genitourinary: denies dysuria, urgency, frequency, hematuria, flank pain and difficulty urinating.  Musculoskeletal: denies  myalgias, back pain, joint swelling, arthralgias and gait problem.   Skin: denies pallor, rash and wound.  Neurological: denies dizziness, seizures, syncope, weakness, light-headedness, numbness and headaches.   Hematological: denies adenopathy, easy bruising, personal or family bleeding history.  Psychiatric/ Behavioral: denies suicidal ideation, mood changes, confusion, nervousness, sleep disturbance and agitation.       All other systems are reviewed and negative.    PHYSICAL EXAM: VS:  BP 110/48 mmHg  Pulse 85  Ht 5\' 9"  (1.753 m)  Wt 69.908 kg (154 lb 1.9 oz)  BMI 22.75 kg/m2 , BMI Body mass index is 22.75 kg/(m^2). GEN: Well nourished, well developed, in no acute distress HEENT: normal Neck: no JVD, carotid bruits, or masses  Cardiac: RRR; no murmurs, rubs, or gallops,no edema  Respiratory:  clear to auscultation bilaterally, normal  work of breathing GI: soft, nontender, nondistended, + BS MS: no deformity or atrophy Skin: warm and dry, no rash Neuro:  Strength and sensation are intact Psych: normal   EKG:  EKG is ordered today.  NSR at 72.  RBBB. No ST or T wave changs.     Recent Labs: 06/07/2014: ALT 27 06/08/2014: TSH 1.831 09/12/2014: BUN 12; Creatinine, Ser 1.12; Potassium 3.8; Sodium 136 09/13/2014: Hemoglobin 9.1*; Platelets 115*    Lipid Panel    Component Value Date/Time   CHOL 119 08/07/2013 0946   TRIG 50.0 08/07/2013 0946   HDL 39.50 08/07/2013 0946   CHOLHDL 3 08/07/2013 0946   VLDL 10.0 08/07/2013 0946   LDLCALC 70 08/07/2013 0946      Wt Readings from Last 3 Encounters:  01/31/15 69.908 kg (154 lb 1.9 oz)  10/29/14 67.314 kg (148 lb 6.4 oz)  09/11/14 69.4 kg (153 lb)      Other studies Reviewed: Additional studies/ records that were reviewed today include: . Review of the above records demonstrates:    ASSESSMENT AND PLAN:  1. CAD - s/p stenting July 2012.  - he was recently admitted to the hospital with some atypical chest pain. He was started on Ranexa and has done very well. [name] not had any further episodes of pain.  He still has occasional episodes of angina. [name] on Ranexa. I think that his angina will improve if we get his hemoglobin better.  2. Hypertension - pressure is well-controlled. Continue same mes.   3. Hyperlipidemia - stable   4. Diabetes Mellitus   5. Lower GI bleed. He was admitted recently with a lower GI bleed. He is on Coumadin for paroxysmal atrial fibrillation. His INR levels have been monitored by his primary medical doctor. GI bleeding has resolved.    Current medicines are reviewed at length with the patient today.  The patient does not have concerns regarding medicines.  The following changes have been made:  no change   Disposition:   FU with me in 6 months     Signed, Nahser, Wonda Cheng, MD  01/31/2015 12:04 PM    [address], [place], [place]  [address] Phone: [phone]; Fax: [phone]

## 2015-01-31 NOTE — Patient Instructions (Signed)
Medication Instructions:  Your physician recommends that you continue on your current medications as directed. Please refer to the Current Medication list given to you today.   Labwork: None ordered  Testing/Procedures: None ordered  Follow-Up: Your physician wants you to follow-up in: 6 months with Dr.Nahser You will receive a reminder letter in the mail two months in advance. If you don't receive a letter, please call our office to schedule the follow-up appointment.   Any Other Special Instructions Will Be Listed Below (If Applicable).

## 2015-02-17 ENCOUNTER — Other Ambulatory Visit: Payer: Self-pay | Admitting: Cardiovascular Disease

## 2015-05-15 ENCOUNTER — Other Ambulatory Visit: Payer: Self-pay | Admitting: Cardiovascular Disease

## 2015-05-15 NOTE — Telephone Encounter (Signed)
Most recent lipid panel in epic is from over one year ago. Please advise. Thanks, MI

## 2015-07-04 ENCOUNTER — Other Ambulatory Visit: Payer: Self-pay | Admitting: Cardiovascular Disease

## 2015-07-30 ENCOUNTER — Ambulatory Visit (INDEPENDENT_AMBULATORY_CARE_PROVIDER_SITE_OTHER): Payer: Medicare Other | Admitting: Cardiovascular Disease

## 2015-07-30 ENCOUNTER — Encounter: Payer: Self-pay | Admitting: Nurse Practitioner

## 2015-07-30 ENCOUNTER — Encounter: Payer: Self-pay | Admitting: Cardiovascular Disease

## 2015-07-30 VITALS — BP 150/80 | HR 62 | Ht 69.0 in | Wt 161.4 lb

## 2015-07-30 DIAGNOSIS — I10 Essential (primary) hypertension: Secondary | ICD-10-CM | POA: Diagnosis not present

## 2015-07-30 DIAGNOSIS — I25810 Atherosclerosis of coronary artery bypass graft(s) without angina pectoris: Secondary | ICD-10-CM | POA: Diagnosis not present

## 2015-07-30 MED ORDER — NITROGLYCERIN 0.4 MG SL SUBL: 0.4000 mg | SUBLINGUAL_TABLET | SUBLINGUAL | Status: DC | PRN

## 2015-07-30 MED ORDER — ISOSORBIDE MONONITRATE ER 60 MG PO TB24: 90.0000 mg | ORAL_TABLET | Freq: Every day | ORAL | Status: DC

## 2015-07-30 MED ORDER — RANOLAZINE ER 1000 MG PO TB12: 1000.0000 mg | ORAL_TABLET | Freq: Two times a day (BID) | ORAL | Status: DC

## 2015-07-30 NOTE — Progress Notes (Signed)
Cardiology Office Note   Date:  07/30/2015   ID:  [name], DOB 1924/01/30, MRN [id number]  PCP:  Irven Shelling, MD  Cardiologist:   Thayer Headings, MD   Chief Complaint  Patient presents with  . Atrial Fibrillation  . Chest Pain  . Hypertension    HAVING CHEST PAIN. NO SOB OR SWELLING AND NO OTHER COMPLAINTS  . Coronary Artery Disease   1. CAD - s/p stenting July 2012.  2. Hypertension  3. Hyperlipidemia  4. Diabetes Mellitus  5. Paroxysmal atrial fib.   6. Lower GI bleed -    History of Present Illness:  [name] is seen today for a 2 week check. He has known CAD with prior PCI to the LAD last year. Other issues include chronic PVC's and PACs, HTN, DM, HLD and systolic heart failure. He now has atrial fib and will be managed with rate control and anticoagulation. He had a failed attempt at [place] in March due to inability to pass the probe into the esophagus. His EF is 35 to 40% with mild to moderate AS.  His INR has been therapeutic. He really is not interested in doing cardioversion.  December 23, 2011 - he was initially seen in the emergency room. He has progressive back pain associated with increasing shortness of breath. [name] noticed that if he takes a nitroglycerin at this back pain and his shortness of breath improved. He has these discomforts with minor levels of exertion. He is also having at rest.  March 20, 2012: [name] presented with some episodes of chest pain when I last saw him in August. He had a stress Myoview study that was normal. He still walks almost every day - he goes around [employer] 1-1 1/2 miles a day. He had a brief episode of CP yesterday - relieved with NTG. He had some food poisoning recently ( raw oysters)  Oct 03, 2012:  He has been having some mid abdominal pain with radiation around to the back. He took a NTG with relief. The pain was a dull pain and would last 30 minutes or so. He has been putting in his  garden without any without any problems.   Oct. 1, 2014:  [name] is doing ok. He is having problems with his memory. Has trouble getting the words out sometimes. No CVA symptoms. No focal neuro findings.  No  CP or dyspnea. Still working out in his garden and with the wildlife club at [facility].   August 10, 2013:  [name] has been having some chest tightness . Occurs 1-2 times a week. The chest tightness is relieved if he takes a nitroglycerin. he also crushes up a full strength aspirin and takes that. The pains occur with rest. Not associated with eating Or drinking anything. Similar to his previous epidoses of CP but are not as severe.  December 24, 2013:  [name] has been hospitalized since I last saw him. He had some chest pain and a followup cardiac catheterization revealed that his stents were okay. [name] been on Ranexa but complains of the cost is a bit high. The Ranexa seems to be helping quite a bit.    Feb. 18, 2016:    [name] is a 82 y.o. male who presents for follow up of his CAD Fells great.  Has been feeling better on the Ranexa.  Wants to cut the Ranexa to every other day.   Enjoying life.  Not exercising much these days.  Gets his INR levels drawn at his primary medical doctors office.   Sept. 23, 2016: [name] is feeling great. No CP No dyspnea  BP has been well controlled   Had some bloody stools  - has resolved.   July 30, 2015 Overall doing the same Has occasional CP , takes SL NTG once a week .  Seems to help Does not occur with exertion  Has not been walking much recently  -        Past Medical History  Diagnosis Date  . Coronary artery disease     a. s/p PCI w/ DES to mLAD 08/05/10. b. NSTEMI 08/2013 (mildly elev trop) - cath showing widely patent stent, 80% prox D1 (small vessel) with recommendation for medical management, residual 60% ostial PDA, <20% LCx, LVEF 55-65%.   . Hypertension   . Hyperlipidemia   . Barrett  esophagus   . Gallstone pancreatitis 2011    a. s/p chole.  . SBO (small bowel obstruction) ([place]) 2005  . Macrocytic anemia 06/25/2011  . PAF (paroxysmal atrial fibrillation) ([place])     a. failed TEE due to inability to pass probe into the esophagus in March 2013. b. On Coumadin. Was in NSR 08/2013 admission.  . Chronic anticoagulation   . Depression   . GERD (gastroesophageal reflux disease)   . Colon cancer ([place])     a. 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 b.  "graduated" from their practice 06/2013, remains free of any new disease per those notes.  . Type II diabetes mellitus ([place])   . Premature atrial contractions   . Premature ventricular contractions   . Aortic stenosis     a. Mild-mod by echo 2013.  [name] Chronic systolic CHF (congestive heart failure) (HCC)     a. EF previously 35-40%. b. improved to 55-65% by cath 08/2013.  . Myelodysplastic disease ([place])     a. Suspected low-grade  myelodysplastic disease per onc notes.  . Other pancytopenia ([place]) 06/18/2014    Mild, fluctuating, likely med related. Rule out early MDS from prior chemo/RT or colon cancer    Past Surgical History  Procedure Laterality Date  . Cholecystectomy  2011  . Right colectomy    . Coronary angioplasty with stent placement  08/05/10    DES to the LAD  . Cardiac catheterization  08/03/10  . Cardiac catheterization  08/09/10    LAD 30, stent OK, CFX 40, RCA < 20  . Abdominal mass resection      Mass near mesentery  . Cystoscopy      "with removal of kidney stone in office"  . Cataract extraction Bilateral   . Appendectomy      "took out during colon surgery"  . Cystoscopy with retrograde pyelogram, ureteroscopy and stent placement Bilateral 05/18/2013    Procedure: CYSTOSCOPY WITH BILATERAL RETROGRADE PYELOGRAM, LEFT URETEROSCOPY AND LEFT STENT PLACEMENT;  Surgeon: Alexis Frock, MD;  Location: [facility];  Service: Urology;  Laterality: Bilateral;  . Colon surgery    . Tonsillectomy    . Left  heart catheterization with coronary angiogram N/A 08/15/2013    Procedure: LEFT HEART CATHETERIZATION WITH CORONARY ANGIOGRAM;  Surgeon: Peter M Martinique, MD;  Location[facility] CATH LAB;  Service: Cardiovascular;  Laterality: N/A;     Current Outpatient Prescriptions  Medication Sig Dispense Refill  . atorvastatin (LIPITOR) 40 MG tablet TAKE ONE TABLET BY MOUTH ONCE DAILY AT 6PM 90 tablet 0  . carvedilol (COREG) 25 MG tablet Take 1 tablet (  25 mg total) by mouth 2 (two) times daily. 180 tablet 3  . carvedilol (COREG) 25 MG tablet TAKE ONE TABLET BY MOUTH TWICE DAILY WITH FOOD 60 tablet 11  . Cholecalciferol (VITAMIN D-3 PO) Take 1 tablet by mouth daily.     [name] donepezil (ARICEPT) 5 MG tablet Take 5 mg by mouth at bedtime.     . finasteride (PROSCAR) 5 MG tablet Take 5 mg by mouth at bedtime.     [name] glimepiride (AMARYL) 4 MG tablet Take 4 mg by mouth daily after breakfast.     . isosorbide mononitrate (IMDUR) 60 MG 24 hr tablet TAKE ONE AND ONE-HALF TABLETS BY MOUTH TWICE DAILY 90 tablet 11  . linagliptin (TRADJENTA) 5 MG TABS tablet Take 5 mg by mouth daily. For blood sugar    . lisinopril (PRINIVIL,ZESTRIL) 5 MG tablet Take 5 mg by mouth daily.    [name] loratadine (CLARITIN) 10 MG tablet Take 10 mg by mouth daily as needed for allergies.    . Melatonin (RA MELATONIN) 10 MG TABS Take 10 mg by mouth at bedtime.     . Multiple Vitamins-Minerals (PRESERVISION/LUTEIN) CAPS Take 1 capsule by mouth daily.   0  . nitroGLYCERIN (NITROSTAT) 0.4 MG SL tablet Place 1 tablet (0.4 mg total) under the tongue every 5 (five) minutes as needed for chest pain. 25 tablet 5  . omeprazole (PRILOSEC) 20 MG capsule Take 20 mg by mouth daily.    Vladimir Faster Glycol-Propyl Glycol (SYSTANE OP) Place 1 drop into both eyes as directed. 2-4 times a day    . ranolazine (RANEXA) 500 MG 12 hr tablet Take 1 tablet (500 mg total) by mouth 2 (two) times daily. 60 tablet 11  . warfarin (COUMADIN) 4 MG tablet Take 0.5-1 tablets (2-4 mg total)  by mouth daily. Take 2 mg on Tues / Thurs ** Take 4 mg on Sun / Mon / Wed / Fri / Sat  START ON 09/18/14-Wednesday-MAKE SURE YOU GO TO YOUR PRIMARY DOCTOR'S OFFICE FOR CBC/INR CHECK WITHIN 1 WEEK     No current facility-administered medications for this visit.    Allergies:   Metformin and related    Social History:  The patient  reports that he quit smoking about 21 years ago. His smoking use included Cigarettes. He has a 30 pack-year smoking history. He has never used smokeless tobacco. He reports that he drinks about 1.2 oz of alcohol per week. He reports that he does not use illicit drugs.   Family History:  The patient's family history includes Cancer in his mother; Heart attack in his brother; Ulcers in his father.    ROS:  Please see the history of present illness.    Review of Systems: Constitutional:  denies fever, chills, diaphoresis, appetite change and fatigue.  HEENT: denies photophobia, eye pain, redness, hearing loss, ear pain, congestion, sore throat, rhinorrhea, sneezing, neck pain, neck stiffness and tinnitus.  Respiratory: denies SOB, DOE, cough, chest tightness, and wheezing.  Cardiovascular: denies chest pain, palpitations and leg swelling.  Gastrointestinal: denies nausea, vomiting, abdominal pain, diarrhea, constipation, blood in stool.  Genitourinary: denies dysuria, urgency, frequency, hematuria, flank pain and difficulty urinating.  Musculoskeletal: denies  myalgias, back pain, joint swelling, arthralgias and gait problem.   Skin: denies pallor, rash and wound.  Neurological: denies dizziness, seizures, syncope, weakness, light-headedness, numbness and headaches.   Hematological: denies adenopathy, easy bruising, personal or family bleeding history.  Psychiatric/ Behavioral: denies suicidal ideation, mood changes, confusion, nervousness, sleep disturbance  and agitation.       All other systems are reviewed and negative.    PHYSICAL EXAM: VS:  BP 150/80  mmHg  Pulse 62  Ht 5\' 9"  (1.753 m)  Wt 161 lb 6.4 oz (73.211 kg)  BMI 23.82 kg/m2 , BMI Body mass index is 23.82 kg/(m^2). GEN: Well nourished, well developed, in no acute distress HEENT: normal Neck: no JVD, carotid bruits, or masses Cardiac: RRR; no murmurs, rubs, or gallops,no edema  Respiratory:  clear to auscultation bilaterally, normal work of breathing GI: soft, nontender, nondistended, + BS MS: no deformity or atrophy Skin: warm and dry, no rash Neuro:  Strength and sensation are intact Psych: normal   EKG:  EKG is ordered today.  NSR at 62.   occsional PVC,  RBBB    Recent Labs: 09/12/2014: BUN 12; Creatinine, Ser 1.12; Potassium 3.8; Sodium 136 09/13/2014: Hemoglobin 9.1*; Platelets 115*    Lipid Panel    Component Value Date/Time   CHOL 119 08/07/2013 0946   TRIG 50.0 08/07/2013 0946   HDL 39.50 08/07/2013 0946   CHOLHDL 3 08/07/2013 0946   VLDL 10.0 08/07/2013 0946   LDLCALC 70 08/07/2013 0946      Wt Readings from Last 3 Encounters:  07/30/15 161 lb 6.4 oz (73.211 kg)  01/31/15 154 lb 1.9 oz (69.908 kg)  10/29/14 148 lb 6.4 oz (67.314 kg)      Other studies Reviewed: Additional studies/ records that were reviewed today include: . Review of the above records demonstrates:    ASSESSMENT AND PLAN:  1. CAD - s/p stenting July 2012.  - he continues to have angina.   Has has diffuse irregularities as well as several stents.  Will increase Imdur to 90 mg a day ( currently just taking 60 )  Increase ranexa to 1000 mg BID  Refill SL NTG   2. Hypertension - pressure is well-controlled. Continue same meds.   3. Hyperlipidemia - stable   4. Diabetes Mellitus   5. Lower GI bleed. He was admitted recently with a lower GI bleed. He is on Coumadin for paroxysmal atrial fibrillation. His INR levels have been monitored by his primary medical doctor. GI bleeding has resolved.    Current medicines are reviewed at length with the patient today.  The patient  does not have concerns regarding medicines.  The following changes have been made:  no change   Disposition:   FU with me in 6 months     Signed, Oriyah Lamphear, Wonda Cheng, MD  07/30/2015 4:00 PM    [address], [place], [place]  [address] Phone: [phone]; Fax: [phone]

## 2015-07-30 NOTE — Patient Instructions (Addendum)
Medication Instructions:  INCREASE Imdur - take 1 1/2 tabs (90 mg) every morning  INCREASE Ranexa to 1000 mg twice daily   Labwork: None Ordered   Testing/Procedures: None Ordered   Follow-Up: Your physician recommends that you schedule a follow-up appointment in: 3 months with Dr. [name].  Bring your pill bottles with you to the next appointment   If you need a refill on your cardiac medications before your next appointment, please call your pharmacy.   Thank you for choosing [facility]! Christen Bame, RN [phone]

## 2015-08-06 ENCOUNTER — Encounter: Payer: Self-pay | Admitting: Cardiovascular Disease

## 2015-08-09 ENCOUNTER — Emergency Department (HOSPITAL_COMMUNITY): Payer: Medicare Other

## 2015-08-09 ENCOUNTER — Encounter (HOSPITAL_COMMUNITY): Payer: Self-pay | Admitting: Emergency Medicine

## 2015-08-09 ENCOUNTER — Observation Stay (HOSPITAL_COMMUNITY)
Admission: EM | Admit: 2015-08-09 | Discharge: 2015-08-10 | Disposition: A | Discharge status: Home / Self Care | Payer: Medicare Other | Attending: Internal Medicine | Admitting: Internal Medicine

## 2015-08-09 DIAGNOSIS — R079 Chest pain, unspecified: Secondary | ICD-10-CM | POA: Diagnosis present

## 2015-08-09 DIAGNOSIS — D696 Thrombocytopenia, unspecified: Secondary | ICD-10-CM | POA: Insufficient documentation

## 2015-08-09 DIAGNOSIS — I110 Hypertensive heart disease with heart failure: Secondary | ICD-10-CM | POA: Insufficient documentation

## 2015-08-09 DIAGNOSIS — R0789 Other chest pain: Principal | ICD-10-CM | POA: Insufficient documentation

## 2015-08-09 DIAGNOSIS — I252 Old myocardial infarction: Secondary | ICD-10-CM | POA: Diagnosis not present

## 2015-08-09 DIAGNOSIS — Z955 Presence of coronary angioplasty implant and graft: Secondary | ICD-10-CM | POA: Diagnosis not present

## 2015-08-09 DIAGNOSIS — Z7901 Long term (current) use of anticoagulants: Secondary | ICD-10-CM | POA: Insufficient documentation

## 2015-08-09 DIAGNOSIS — I429 Cardiomyopathy, unspecified: Secondary | ICD-10-CM | POA: Insufficient documentation

## 2015-08-09 DIAGNOSIS — I2510 Atherosclerotic heart disease of native coronary artery without angina pectoris: Secondary | ICD-10-CM | POA: Insufficient documentation

## 2015-08-09 DIAGNOSIS — Z79899 Other long term (current) drug therapy: Secondary | ICD-10-CM | POA: Insufficient documentation

## 2015-08-09 DIAGNOSIS — E119 Type 2 diabetes mellitus without complications: Secondary | ICD-10-CM | POA: Diagnosis not present

## 2015-08-09 DIAGNOSIS — D539 Nutritional anemia, unspecified: Secondary | ICD-10-CM | POA: Diagnosis present

## 2015-08-09 DIAGNOSIS — C772 Secondary and unspecified malignant neoplasm of intra-abdominal lymph nodes: Secondary | ICD-10-CM | POA: Diagnosis not present

## 2015-08-09 DIAGNOSIS — M25512 Pain in left shoulder: Secondary | ICD-10-CM

## 2015-08-09 DIAGNOSIS — E785 Hyperlipidemia, unspecified: Secondary | ICD-10-CM | POA: Insufficient documentation

## 2015-08-09 DIAGNOSIS — C189 Malignant neoplasm of colon, unspecified: Secondary | ICD-10-CM | POA: Diagnosis present

## 2015-08-09 DIAGNOSIS — Z85038 Personal history of other malignant neoplasm of large intestine: Secondary | ICD-10-CM | POA: Insufficient documentation

## 2015-08-09 DIAGNOSIS — I480 Paroxysmal atrial fibrillation: Secondary | ICD-10-CM | POA: Insufficient documentation

## 2015-08-09 DIAGNOSIS — I5022 Chronic systolic (congestive) heart failure: Secondary | ICD-10-CM | POA: Diagnosis not present

## 2015-08-09 DIAGNOSIS — Z87891 Personal history of nicotine dependence: Secondary | ICD-10-CM | POA: Insufficient documentation

## 2015-08-09 DIAGNOSIS — I5020 Unspecified systolic (congestive) heart failure: Secondary | ICD-10-CM | POA: Diagnosis present

## 2015-08-09 DIAGNOSIS — I10 Essential (primary) hypertension: Secondary | ICD-10-CM | POA: Diagnosis present

## 2015-08-09 LAB — CBC
HEMATOCRIT: 37.1 % — AB (ref 39.0–52.0)
HEMOGLOBIN: 12.8 g/dL — AB (ref 13.0–17.0)
MCH: 35.2 pg — ABNORMAL HIGH (ref 26.0–34.0)
MCHC: 34.5 g/dL (ref 30.0–36.0)
MCV: 101.9 fL — ABNORMAL HIGH (ref 78.0–100.0)
Platelets: 119 10*3/uL — ABNORMAL LOW (ref 150–400)
RBC: 3.64 MIL/uL — ABNORMAL LOW (ref 4.22–5.81)
RDW: 12.8 % (ref 11.5–15.5)
WBC: 4.7 10*3/uL (ref 4.0–10.5)

## 2015-08-09 LAB — BASIC METABOLIC PANEL
Anion gap: 8 (ref 5–15)
BUN: 13 mg/dL (ref 6–20)
CO2: 22 mmol/L (ref 22–32)
Calcium: 8.9 mg/dL (ref 8.9–10.3)
Chloride: 108 mmol/L (ref 101–111)
Creatinine, Ser: 1 mg/dL (ref 0.61–1.24)
GFR calc Af Amer: >60 mL/min (ref >60)
Glucose, Bld: 80 mg/dL (ref 65–99)
POTASSIUM: 4.6 mmol/L (ref 3.5–5.1)
SODIUM: 138 mmol/L (ref 135–145)

## 2015-08-09 LAB — I-STAT TROPONIN, ED: Troponin i, poc: 0.01 ng/mL (ref 0.00–0.08)

## 2015-08-09 NOTE — ED Notes (Signed)
Brought via EMS for c/o chest  left shoulder, neck, and arm pain that started 1-2 days ago.  Took ntg sl X 6.  Chest pain relieved but still having pain in arm and neck.  Reports it comes and goes.  Now rating at 3/10.

## 2015-08-09 NOTE — ED Provider Notes (Signed)
CSN: [id number]     Arrival date & time 08/09/15  2223 History  By signing my name below, I, [name], attest that this documentation has been prepared under the direction and in the presence of Alfonzo Beers, MD. Electronically Signed: [name], ED Scribe. 08/09/2015. 12:45 AM.  Chief Complaint  Patient presents with  . Arm Pain  . Neck Pain   Patient is a 82 y.o. male presenting with arm pain and neck pain. The history is provided by the patient. No language interpreter was used.  Arm Pain This is a new problem. The current episode started more than 2 days ago. The problem has been gradually worsening. Associated symptoms include chest pain. Treatments tried: nitroglycerin. The treatment provided moderate relief.  Neck Pain Associated symptoms: chest pain   Associated symptoms: no fever   HPI Comments: [name] is a 82 y.o. male with a hx of CAD, HTN, SBO, PAF, cardiac catheterization, stent placement, chronic anticoagulation, chronic systolic CHF, myelodysplasic disease, and pancytopenia brought in by EMS who presents to the Emergency Department complaining of intermittent, gradually worsening left shoulder pain that radiates to the left arm onset 3 days ago. Pt reports associated chest discomfort that kept him awake at night and intermittent neck pain. He reports that the pain is greatest in his arm and initially thought that it was muscular in nature until he began to have discomfort in the chest. He states that the pain does not feel like his former heart disease pain. Pt tried to take nitroglycerin x6 for his pain to relief. He denies fever, cough, or leg swelling. Pt currently denies chest pain.  Cardiologist: Dr. [name].  PCP: Dr. Laurann Montana Past Medical History  Diagnosis Date  . Coronary artery disease     a. s/p PCI w/ DES to mLAD 08/05/10. b. NSTEMI 08/2013 (mildly elev trop) - cath showing widely patent stent, 80% prox D1 (small vessel) with recommendation for  medical management, residual 60% ostial PDA, <20% LCx, LVEF 55-65%.   . Hypertension   . Hyperlipidemia   . Barrett esophagus   . Gallstone pancreatitis 2011    a. s/p chole.  . SBO (small bowel obstruction) ([place]) 2005  . Macrocytic anemia 06/25/2011  . PAF (paroxysmal atrial fibrillation) ([place])     a. failed TEE due to inability to pass probe into the esophagus in March 2013. b. On Coumadin. Was in NSR 08/2013 admission.  . Chronic anticoagulation   . Depression   . GERD (gastroesophageal reflux disease)   . Colon cancer ([place])     a. 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 b.  "graduated" from their practice 06/2013, remains free of any new disease per those notes.  . Type II diabetes mellitus ([place])   . Premature atrial contractions   . Premature ventricular contractions   . Aortic stenosis     a. Mild-mod by echo 2013.  [name] Chronic systolic CHF (congestive heart failure) (HCC)     a. EF previously 35-40%. b. improved to 55-65% by cath 08/2013.  . Myelodysplastic disease ([place])     a. Suspected low-grade  myelodysplastic disease per onc notes.  . Other pancytopenia ([place]) 06/18/2014    Mild, fluctuating, likely med related. Rule out early MDS from prior chemo/RT or colon cancer   Past Surgical History  Procedure Laterality Date  . Cholecystectomy  2011  . Right colectomy    . Coronary angioplasty with stent placement  08/05/10    DES to the  LAD  . Cardiac catheterization  08/03/10  . Cardiac catheterization  08/09/10    LAD 30, stent OK, CFX 40, RCA < 20  . Abdominal mass resection      Mass near mesentery  . Cystoscopy      "with removal of kidney stone in office"  . Cataract extraction Bilateral   . Appendectomy      "took out during colon surgery"  . Cystoscopy with retrograde pyelogram, ureteroscopy and stent placement Bilateral 05/18/2013    Procedure: CYSTOSCOPY WITH BILATERAL RETROGRADE PYELOGRAM, LEFT URETEROSCOPY AND LEFT STENT PLACEMENT;  Surgeon: Alexis Frock,  MD;  Location: [facility];  Service: Urology;  Laterality: Bilateral;  . Colon surgery    . Tonsillectomy    . Left heart catheterization with coronary angiogram N/A 08/15/2013    Procedure: LEFT HEART CATHETERIZATION WITH CORONARY ANGIOGRAM;  Surgeon: Peter M Martinique, MD;  Location: [facility] CATH LAB;  Service: Cardiovascular;  Laterality: N/A;   Family History  Problem Relation Age of Onset  . Cancer Mother   . Ulcers Father   . Heart attack Brother    Social History  Substance Use Topics  . Smoking status: Former Smoker -- 1.00 packs/day for 30 years    Types: Cigarettes    Quit date: 05/10/1994  . Smokeless tobacco: Never Used  . Alcohol Use: 1.2 oz/week    2 Shots of liquor per week     Comment: occasionally.    Review of Systems  Constitutional: Negative for fever.  Respiratory: Negative for cough.   Cardiovascular: Positive for chest pain. Negative for leg swelling.  Musculoskeletal: Positive for arthralgias and neck pain.  All other systems reviewed and are negative.     Allergies  Metformin and related  Home Medications   Prior to Admission medications   Medication Sig Start Date End Date Taking? Authorizing Provider  atorvastatin (LIPITOR) 40 MG tablet TAKE ONE TABLET BY MOUTH ONCE DAILY AT 6PM 05/16/15  Yes Thayer Headings, MD  carvedilol (COREG) 25 MG tablet TAKE ONE TABLET BY MOUTH TWICE DAILY WITH FOOD 07/04/15  Yes Thayer Headings, MD  Cholecalciferol (VITAMIN D-3 PO) Take 1 tablet by mouth daily.    Yes Historical Provider, MD  donepezil (ARICEPT) 5 MG tablet Take 5 mg by mouth at bedtime.  03/16/14  Yes Historical Provider, MD  finasteride (PROSCAR) 5 MG tablet Take 5 mg by mouth at bedtime.  04/01/14  Yes Historical Provider, MD  glimepiride (AMARYL) 4 MG tablet Take 4 mg by mouth daily after breakfast.    Yes Historical Provider, MD  isosorbide mononitrate (IMDUR) 60 MG 24 hr tablet Take 1.5 tablets (90 mg total) by mouth daily. 07/30/15  Yes Thayer Headings, MD   linagliptin (TRADJENTA) 5 MG TABS tablet Take 5 mg by mouth daily. For blood sugar   Yes Historical Provider, MD  lisinopril (PRINIVIL,ZESTRIL) 5 MG tablet Take 5 mg by mouth daily. 01/27/15  Yes Historical Provider, MD  loratadine (CLARITIN) 10 MG tablet Take 10 mg by mouth daily as needed for allergies.   Yes Historical Provider, MD  Melatonin (RA MELATONIN) 10 MG TABS Take 10 mg by mouth at bedtime.    Yes Historical Provider, MD  Multiple Vitamins-Minerals (PRESERVISION/LUTEIN) CAPS Take 1 capsule by mouth daily.  08/13/10  Yes Romeo Apple, MD  nitroGLYCERIN (NITROSTAT) 0.4 MG SL tablet Place 1 tablet (0.4 mg total) under the tongue every 5 (five) minutes as needed for chest pain. 07/30/15  Yes Wonda Cheng Nahser,  MD  omeprazole (PRILOSEC) 20 MG capsule Take 20 mg by mouth daily.   Yes Historical Provider, MD  Polyethyl Glycol-Propyl Glycol (SYSTANE OP) Place 1 drop into both eyes as directed. 2-4 times a day   Yes Historical Provider, MD  ranolazine (RANEXA) 1000 MG SR tablet Take 1 tablet (1,000 mg total) by mouth 2 (two) times daily. 07/30/15  Yes Thayer Headings, MD  warfarin (COUMADIN) 4 MG tablet Take 0.5-1 tablets (2-4 mg total) by mouth daily. Take 2 mg on Tues / Thurs ** Take 4 mg on Sun / Mon / Wed / Fri / Sat  START ON 09/18/14-Wednesday-MAKE SURE YOU GO TO YOUR PRIMARY DOCTOR'S OFFICE FOR CBC/INR CHECK WITHIN 1 WEEK Patient taking differently: Take 4 mg by mouth daily.  09/18/14  Yes Shanker Kristeen Mans, MD   BP 173/92 mmHg  Pulse 40  Resp 20  Ht 5\' 9"  (1.753 m)  Wt 71.215 kg  BMI 23.17 kg/m2  SpO2 94%  Vitals reviewed Physical Exam  Physical Examination: General appearance - alert, well appearing, and in no distress Mental status - alert, oriented to person, place, and time Eyes - no conjunctival injection no scleral icterus Mouth - mucous membranes moist, pharynx normal without lesions Chest - clear to auscultation, no wheezes, rales or rhonchi, symmetric air entry Heart -  normal rate, regular rhythm, normal S1, S2, no murmurs, rubs, clicks or gallops Abdomen - soft, nontender, nondistended, no masses or organomegaly Neurological - alert, oriented, normal speech Extremities - peripheral pulses normal, no pedal edema, no clubbing or cyanosis Skin - normal coloration and turgor, no rashes  ED Course  Procedures (including critical care time) DIAGNOSTIC STUDIES:   COORDINATION OF CARE: 11:31 PM-Discussed treatment plan which includes labs, EKG, and x-ray with pt at bedside and pt agreed to plan.    Labs Review Labs Reviewed  CBC - Abnormal; Notable for the following:    RBC 3.64 (*)    Hemoglobin 12.8 (*)    HCT 37.1 (*)    MCV 101.9 (*)    MCH 35.2 (*)    Platelets 119 (*)    All other components within normal limits  BASIC METABOLIC PANEL  PROTIME-INR  Randolm Idol, ED    Imaging Review Dg Chest 2 View  08/09/2015  CLINICAL DATA:  Brought via EMS for c/o chest left shoulder, neck, and arm pain that started 1-2 days ago. No hx of HTN or diabetes. Pt is a non smoker. EXAM: CHEST  2 VIEW COMPARISON:  09/11/2014 FINDINGS: Hyperinflation. Midline trachea. Normal heart size and mediastinal contours for age. No pleural effusion or pneumothorax. Moderate to marked pulmonary interstitial thickening is similar. Somewhat more focal lateral right upper and right infrahilar opacities are similar and favored to represent areas of scarring. No at new areas of consolidation. IMPRESSION: Moderate chronic pulmonary interstitial thickening likely related to smoking/chronic bronchitis. More focal areas of presumed scarring within the right lung. No acute superimposed process. Electronically Signed   By: Abigail Miyamoto M.D.   On: 08/09/2015 23:42   I have personally reviewed and evaluated these images and lab results as part of my medical decision-making.   EKG Interpretation   Date/Time:  Saturday August 09 2015 22:28:46 EDT Ventricular Rate:  73 PR Interval:   198 QRS Duration: 141 QT Interval:  442 QTC Calculation: 487 R Axis:   95 Text Interpretation:  Sinus rhythm Right bundle branch block Since  previous tracing PVCs are no longer present Confirmed by [facility]  MD,  MARTHA [phone]) on 08/09/2015 11:11:24 PM      MDM   Final diagnoses:  Chest pain, unspecified chest pain type    Pt presenting with c/o chest pain and left shoulder pain.  Chest pain was relieved by nitroglycerin prior to arrival.  Shoulder pain may be muscular in nature, but in this patient with multiple risk factors and elevated heart score will need to r/o ACS.  EKG is unchanged, initial troponin is low.  Pt admitted to triad for further workup and management.    12:22 AM d/w [facility] hospitalist dr. Emilee Hero.  Pt to be admitted to telemetry floor.  Pt is chest pain free.    I personally performed the services described in this documentation, which was scribed in my presence. The recorded information has been reviewed and is accurate.    Alfonzo Beers, MD 08/10/15 [phone]

## 2015-08-10 ENCOUNTER — Encounter (HOSPITAL_COMMUNITY): Payer: Self-pay | Admitting: Family Medicine

## 2015-08-10 ENCOUNTER — Observation Stay (HOSPITAL_COMMUNITY): Payer: Medicare Other

## 2015-08-10 DIAGNOSIS — R0789 Other chest pain: Secondary | ICD-10-CM | POA: Diagnosis not present

## 2015-08-10 DIAGNOSIS — I2510 Atherosclerotic heart disease of native coronary artery without angina pectoris: Secondary | ICD-10-CM | POA: Diagnosis not present

## 2015-08-10 DIAGNOSIS — C772 Secondary and unspecified malignant neoplasm of intra-abdominal lymph nodes: Secondary | ICD-10-CM

## 2015-08-10 DIAGNOSIS — C189 Malignant neoplasm of colon, unspecified: Secondary | ICD-10-CM

## 2015-08-10 DIAGNOSIS — I10 Essential (primary) hypertension: Secondary | ICD-10-CM

## 2015-08-10 DIAGNOSIS — R079 Chest pain, unspecified: Secondary | ICD-10-CM

## 2015-08-10 DIAGNOSIS — E785 Hyperlipidemia, unspecified: Secondary | ICD-10-CM

## 2015-08-10 DIAGNOSIS — E119 Type 2 diabetes mellitus without complications: Secondary | ICD-10-CM | POA: Insufficient documentation

## 2015-08-10 DIAGNOSIS — I429 Cardiomyopathy, unspecified: Secondary | ICD-10-CM

## 2015-08-10 DIAGNOSIS — I480 Paroxysmal atrial fibrillation: Secondary | ICD-10-CM

## 2015-08-10 DIAGNOSIS — D539 Nutritional anemia, unspecified: Secondary | ICD-10-CM

## 2015-08-10 DIAGNOSIS — D696 Thrombocytopenia, unspecified: Secondary | ICD-10-CM

## 2015-08-10 DIAGNOSIS — I5022 Chronic systolic (congestive) heart failure: Secondary | ICD-10-CM

## 2015-08-10 LAB — GLUCOSE, CAPILLARY
Glucose-Capillary: 132 mg/dL — ABNORMAL HIGH (ref 65–99)
Glucose-Capillary: 98 mg/dL (ref 65–99)
Glucose-Capillary: 139 mg/dL — ABNORMAL HIGH (ref 65–99)

## 2015-08-10 LAB — IRON AND TIBC
Iron: 58 ug/dL (ref 45–182)
SATURATION RATIOS: 19 % (ref 17.9–39.5)
TIBC: 312 ug/dL (ref 250–450)
UIBC: 254 ug/dL

## 2015-08-10 LAB — FERRITIN: Ferritin: 49 ng/mL (ref 24–336)

## 2015-08-10 LAB — PROTIME-INR
INR: 2.92 — ABNORMAL HIGH (ref 0.00–1.49)
PROTHROMBIN TIME: 30 s — AB (ref 11.6–15.2)

## 2015-08-10 LAB — VITAMIN B12: VITAMIN B 12: 345 pg/mL (ref 180–914)

## 2015-08-10 LAB — TROPONIN I
Troponin I: 0.06 ng/mL — ABNORMAL HIGH (ref <0.031)
Troponin I: 0.06 ng/mL — ABNORMAL HIGH (ref <0.031)

## 2015-08-10 LAB — BRAIN NATRIURETIC PEPTIDE: B Natriuretic Peptide: 223.9 pg/mL — ABNORMAL HIGH (ref 0.0–100.0)

## 2015-08-10 MED ORDER — SODIUM CHLORIDE 0.9 % IV SOLN: 250.0000 mL | INTRAVENOUS | Status: DC | PRN

## 2015-08-10 MED ORDER — MELATONIN 3 MG PO TABS
9.0000 mg | ORAL_TABLET | Freq: Every day | ORAL | Status: DC
  Administered 2015-08-10: 9 mg via ORAL
  Filled 2015-08-10: qty 3

## 2015-08-10 MED ORDER — PROSIGHT PO TABS
1.0000 | ORAL_TABLET | Freq: Every day | ORAL | Status: DC
  Filled 2015-08-10: qty 1

## 2015-08-10 MED ORDER — DONEPEZIL HCL 5 MG PO TABS
5.0000 mg | ORAL_TABLET | Freq: Every day | ORAL | Status: DC
  Administered 2015-08-10: 5 mg via ORAL
  Filled 2015-08-10: qty 1

## 2015-08-10 MED ORDER — ONDANSETRON HCL 4 MG/2ML IJ SOLN: 4.0000 mg | Freq: Four times a day (QID) | INTRAMUSCULAR | Status: DC | PRN

## 2015-08-10 MED ORDER — ACETAMINOPHEN 325 MG PO TABS
650.0000 mg | ORAL_TABLET | ORAL | Status: DC | PRN
  Administered 2015-08-10 (×3): 650 mg via ORAL
  Filled 2015-08-10 (×3): qty 2

## 2015-08-10 MED ORDER — NAPROXEN 250 MG PO TABS: 250.0000 mg | ORAL_TABLET | Freq: Three times a day (TID) | ORAL | Status: DC | PRN

## 2015-08-10 MED ORDER — INSULIN ASPART 100 UNIT/ML ~~LOC~~ SOLN: 0.0000 [IU] | Freq: Three times a day (TID) | SUBCUTANEOUS | Status: DC

## 2015-08-10 MED ORDER — VITAMIN D 1000 UNITS PO TABS: 1000.0000 [IU] | ORAL_TABLET | Freq: Every day | ORAL | Status: DC

## 2015-08-10 MED ORDER — WARFARIN SODIUM 4 MG PO TABS: 4.0000 mg | ORAL_TABLET | Freq: Every day | ORAL | Status: DC

## 2015-08-10 MED ORDER — ASPIRIN EC 81 MG PO TBEC
81.0000 mg | DELAYED_RELEASE_TABLET | Freq: Every day | ORAL | Status: DC
Start: 2015-08-11 — End: 2015-08-10

## 2015-08-10 MED ORDER — ATORVASTATIN CALCIUM 40 MG PO TABS
40.0000 mg | ORAL_TABLET | Freq: Every day | ORAL | Status: DC
  Administered 2015-08-10: 40 mg via ORAL
  Filled 2015-08-10: qty 1

## 2015-08-10 MED ORDER — RANOLAZINE ER 500 MG PO TB12
1000.0000 mg | ORAL_TABLET | Freq: Two times a day (BID) | ORAL | Status: DC
  Administered 2015-08-10: 1000 mg via ORAL
  Filled 2015-08-10: qty 2

## 2015-08-10 MED ORDER — LORATADINE 10 MG PO TABS: 10.0000 mg | ORAL_TABLET | Freq: Every day | ORAL | Status: DC | PRN

## 2015-08-10 MED ORDER — NITROGLYCERIN 0.4 MG SL SUBL: 0.4000 mg | SUBLINGUAL_TABLET | SUBLINGUAL | Status: DC | PRN

## 2015-08-10 MED ORDER — SODIUM CHLORIDE 0.9% FLUSH
3.0000 mL | Freq: Two times a day (BID) | INTRAVENOUS | Status: DC
  Administered 2015-08-10 (×2): 3 mL via INTRAVENOUS

## 2015-08-10 MED ORDER — ISOSORBIDE MONONITRATE ER 60 MG PO TB24
90.0000 mg | ORAL_TABLET | Freq: Every day | ORAL | Status: DC
  Administered 2015-08-10: 90 mg via ORAL
  Filled 2015-08-10: qty 1

## 2015-08-10 MED ORDER — METOPROLOL TARTRATE 1 MG/ML IV SOLN
5.0000 mg | Freq: Once | INTRAVENOUS | Status: AC
  Administered 2015-08-10: 5 mg via INTRAVENOUS
  Filled 2015-08-10: qty 5

## 2015-08-10 MED ORDER — PANTOPRAZOLE SODIUM 40 MG PO TBEC: 40.0000 mg | DELAYED_RELEASE_TABLET | Freq: Every day | ORAL | Status: DC

## 2015-08-10 MED ORDER — WARFARIN - PHARMACIST DOSING INPATIENT: Freq: Every day | Status: DC

## 2015-08-10 MED ORDER — CARVEDILOL 25 MG PO TABS
25.0000 mg | ORAL_TABLET | Freq: Two times a day (BID) | ORAL | Status: DC
  Administered 2015-08-10: 25 mg via ORAL
  Filled 2015-08-10: qty 1

## 2015-08-10 MED ORDER — HYDRALAZINE HCL 20 MG/ML IJ SOLN: 10.0000 mg | INTRAMUSCULAR | Status: DC | PRN

## 2015-08-10 MED ORDER — ASPIRIN 81 MG PO CHEW
324.0000 mg | CHEWABLE_TABLET | Freq: Once | ORAL | Status: AC
  Administered 2015-08-10: 324 mg via ORAL
  Filled 2015-08-10: qty 4

## 2015-08-10 MED ORDER — SODIUM CHLORIDE 0.9% FLUSH: 3.0000 mL | INTRAVENOUS | Status: DC | PRN

## 2015-08-10 MED ORDER — HYPROMELLOSE (GONIOSCOPIC) 2.5 % OP SOLN
1.0000 [drp] | OPHTHALMIC | Status: DC | PRN
  Filled 2015-08-10: qty 15

## 2015-08-10 MED ORDER — LISINOPRIL 5 MG PO TABS
5.0000 mg | ORAL_TABLET | Freq: Every day | ORAL | Status: DC
  Administered 2015-08-10: 5 mg via ORAL
  Filled 2015-08-10: qty 1

## 2015-08-10 MED ORDER — FINASTERIDE 5 MG PO TABS
5.0000 mg | ORAL_TABLET | Freq: Every day | ORAL | Status: DC
  Administered 2015-08-10: 5 mg via ORAL
  Filled 2015-08-10: qty 1

## 2015-08-10 NOTE — Discharge Instructions (Signed)
Follow with Primary MD Irven Shelling, MD in 2-3 days   Get CBC, CMP, 2 view Chest X ray checked  by Primary MD next visit.    Activity: As tolerated with Full fall precautions use walker/cane & assistance as needed   Disposition Home    Diet:   Heart Healthy  Low Carb. Check your Weight same time everyday, if you gain over 2 pounds, or you develop in leg swelling, experience more shortness of breath or chest pain, call your Primary MD immediately. Follow Cardiac Low Salt Diet and 1.5 lit/day fluid restriction.   On your next visit with your primary care physician please Get Medicines reviewed and adjusted.   Please request your Prim.MD to go over all Hospital Tests and Procedure/Radiological results at the follow up, please get all Hospital records sent to your Prim MD by signing hospital release before you go home.   If you experience worsening of your admission symptoms, develop shortness of breath, life threatening emergency, suicidal or homicidal thoughts you must seek medical attention immediately by calling 911 or calling your MD immediately  if symptoms less severe.  You Must read complete instructions/literature along with all the possible adverse reactions/side effects for all the Medicines you take and that have been prescribed to you. Take any new Medicines after you have completely understood and accpet all the possible adverse reactions/side effects.   Do not drive, operating heavy machinery, perform activities at heights, swimming or participation in water activities or provide baby sitting services if your were admitted for syncope or siezures until you have seen by Primary MD or a Neurologist and advised to do so again.  Do not drive when taking Pain medications.    Do not take more than prescribed Pain, Sleep and Anxiety Medications  Special Instructions: If you have smoked or chewed Tobacco  in the last 2 yrs please stop smoking, stop any regular Alcohol  and or  any Recreational drug use.  Wear Seat belts while driving.   Please note  You were cared for by a hospitalist during your hospital stay. If you have any questions about your discharge medications or the care you received while you were in the hospital after you are discharged, you can call the unit and asked to speak with the hospitalist on call if the hospitalist that took care of you is not available. Once you are discharged, your primary care physician will handle any further medical issues. Please note that NO REFILLS for any discharge medications will be authorized once you are discharged, as it is imperative that you return to your primary care physician (or establish a relationship with a primary care physician if you do not have one) for your aftercare needs so that they can reassess your need for medications and monitor your lab values.

## 2015-08-10 NOTE — H&P (Signed)
[facility] History and Physical  [name] [id number] DOB: [birth year] DOA: 08/09/2015  Referring physician: ED physician PCP: Irven Shelling, MD  Specialists: Dr. Amedeo Plenty (GI), Dr. [name] (cardiology)   Chief Complaint:  Chest pain   HPI: [name] is a 82 y.o. male with PMH of hypertension, type 2 diabetes mellitus, paroxysmal atrial fibrillation on Coumadin, coronary artery disease with stents, and chronic systolic CHF who presents to the ED with 1-2 days of intermittent chest pain. Patient describes his pain as substernal, pressure-like, and moderate in intensity. Pain was reminiscent of his prior MI. There was concomitant pain in the left posterior shoulder, traveling down his arm, which she had not experienced previously. Patient took nitroglycerin for his pain today, a total of 6 doses, and had immediate resolution of his chest pain with this. The posterior left shoulder pain persisted throughout the day despite the nitroglycerin. He denies any associated shortness of breath, diaphoresis, or nausea. There were no palpitations. He follows regularly with outpatient cardiologist, Dr. [name]. Patient had a drug-eluting stent placed to the mid LAD in March 2012, subsequently suffered another MI in April 2015 and catheterization at that time demonstrated a widely patent stent, noncritical occlusion of the ostial PDA and left circumflex, and small vessel disease. Medical management was recommended at that time. Ejection fraction on the 2015 catheterization was estimated to be 55-60%, improved from an echocardiogram 2 years prior.  In ED, patient was found to be saturating well on room air with vital signs stable. His pain had resolved prior to arrival. EKG feature to sinus rhythm with right bundle branch block and some nonspecific ST-T wave abnormality in the inferior leads. Chest x-ray was negative for acute cardiopulmonary disease. Initial troponin was 0.01. BMP is  unremarkable and CBC features a hemoglobin of 12.8 with MCV of 102. Platelet count is 119,000. Patient was given a 324 mg aspirin in the emergency department. He was monitored on telemetry in the ED, became hypertensive, but remained hemodynamically stable. He'll be admitted to the telemetry unit for ongoing evaluation and management of chest pain concerning for possible ACS.  Where does patient live?   At home  Can patient participate in ADLs?  Yes       Review of Systems:   General: no fevers, chills, sweats, weight change, poor appetite, or fatigue HEENT: no blurry vision, hearing changes or sore throat Pulm: no dyspnea, cough, or wheeze CV: no palpitations. Intermittent CP.  Abd: no nausea, vomiting, abdominal pain, diarrhea, or constipation GU: no dysuria, hematuria, increased urinary frequency, or urgency  Ext: no leg edema Neuro: no focal weakness, numbness, or tingling, no vision change or hearing loss Skin: no rash, no wounds MSK: No muscle spasm, no deformity, no red, hot, or swollen joint. Posterior left shoulder ache.  Heme: No easy bruising or bleeding Travel history: No recent long distant travel    Allergy:  Allergies  Allergen Reactions  . Metformin And Related Nausea And Vomiting    Past Medical History  Diagnosis Date  . Coronary artery disease     a. s/p PCI w/ DES to mLAD 08/05/10. b. NSTEMI 08/2013 (mildly elev trop) - cath showing widely patent stent, 80% prox D1 (small vessel) with recommendation for medical management, residual 60% ostial PDA, <20% LCx, LVEF 55-65%.   . Hypertension   . Hyperlipidemia   . Barrett esophagus   . Gallstone pancreatitis 2011    a. s/p chole.  . SBO (small bowel obstruction) ([place]) 2005  .  Macrocytic anemia 06/25/2011  . PAF (paroxysmal atrial fibrillation) ([place])     a. failed TEE due to inability to pass probe into the esophagus in March 2013. b. On Coumadin. Was in NSR 08/2013 admission.  . Chronic anticoagulation   .  Depression   . GERD (gastroesophageal reflux disease)   . Colon cancer ([place])     a. 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 b.  "graduated" from their practice 06/2013, remains free of any new disease per those notes.  . Type II diabetes mellitus ([place])   . Premature atrial contractions   . Premature ventricular contractions   . Aortic stenosis     a. Mild-mod by echo 2013.  [name] Chronic systolic CHF (congestive heart failure) ([facility])     a. EF previously 35-40%. b. improved to 55-65% by cath 08/2013.  . Myelodysplastic disease ([place])     a. Suspected low-grade  myelodysplastic disease per onc notes.  . Other pancytopenia ([place]) 06/18/2014    Mild, fluctuating, likely med related. Rule out early MDS from prior chemo/RT or colon cancer    Past Surgical History  Procedure Laterality Date  . Cholecystectomy  2011  . Right colectomy    . Coronary angioplasty with stent placement  08/05/10    DES to the LAD  . Cardiac catheterization  08/03/10  . Cardiac catheterization  08/09/10    LAD 30, stent OK, CFX 40, RCA < 20  . Abdominal mass resection      Mass near mesentery  . Cystoscopy      "with removal of kidney stone in office"  . Cataract extraction Bilateral   . Appendectomy      "took out during colon surgery"  . Cystoscopy with retrograde pyelogram, ureteroscopy and stent placement Bilateral 05/18/2013    Procedure: CYSTOSCOPY WITH BILATERAL RETROGRADE PYELOGRAM, LEFT URETEROSCOPY AND LEFT STENT PLACEMENT;  Surgeon: Alexis Frock, MD;  Location: [facility] ORS;  Service: Urology;  Laterality: Bilateral;  . Colon surgery    . Tonsillectomy    . Left heart catheterization with coronary angiogram N/A 08/15/2013    Procedure: LEFT HEART CATHETERIZATION WITH CORONARY ANGIOGRAM;  Surgeon: Peter M Martinique, MD;  Location: [facility] CATH LAB;  Service: Cardiovascular;  Laterality: N/A;    Social History:  reports that he quit smoking about 21 years ago. His smoking use included Cigarettes. He has a 30  pack-year smoking history. He has never used smokeless tobacco. He reports that he drinks about 1.2 oz of alcohol per week. He reports that he does not use illicit drugs.  Family History:  Family History  Problem Relation Age of Onset  . Cancer Mother   . Ulcers Father   . Heart attack Brother      Prior to Admission medications   Medication Sig Start Date End Date Taking? Authorizing Provider  atorvastatin (LIPITOR) 40 MG tablet TAKE ONE TABLET BY MOUTH ONCE DAILY AT 6PM 05/16/15  Yes Thayer Headings, MD  carvedilol (COREG) 25 MG tablet TAKE ONE TABLET BY MOUTH TWICE DAILY WITH FOOD 07/04/15  Yes Thayer Headings, MD  Cholecalciferol (VITAMIN D-3 PO) Take 1 tablet by mouth daily.    Yes Historical Provider, MD  donepezil (ARICEPT) 5 MG tablet Take 5 mg by mouth at bedtime.  03/16/14  Yes Historical Provider, MD  finasteride (PROSCAR) 5 MG tablet Take 5 mg by mouth at bedtime.  04/01/14  Yes Historical Provider, MD  glimepiride (AMARYL) 4 MG tablet Take 4 mg by mouth  daily after breakfast.    Yes Historical Provider, MD  isosorbide mononitrate (IMDUR) 60 MG 24 hr tablet Take 1.5 tablets (90 mg total) by mouth daily. 07/30/15  Yes Thayer Headings, MD  linagliptin (TRADJENTA) 5 MG TABS tablet Take 5 mg by mouth daily. For blood sugar   Yes Historical Provider, MD  lisinopril (PRINIVIL,ZESTRIL) 5 MG tablet Take 5 mg by mouth daily. 01/27/15  Yes Historical Provider, MD  loratadine (CLARITIN) 10 MG tablet Take 10 mg by mouth daily as needed for allergies.   Yes Historical Provider, MD  Melatonin (RA MELATONIN) 10 MG TABS Take 10 mg by mouth at bedtime.    Yes Historical Provider, MD  Multiple Vitamins-Minerals (PRESERVISION/LUTEIN) CAPS Take 1 capsule by mouth daily.  08/13/10  Yes Romeo Apple, MD  nitroGLYCERIN (NITROSTAT) 0.4 MG SL tablet Place 1 tablet (0.4 mg total) under the tongue every 5 (five) minutes as needed for chest pain. 07/30/15  Yes Thayer Headings, MD  omeprazole (PRILOSEC) 20 MG  capsule Take 20 mg by mouth daily.   Yes Historical Provider, MD  Polyethyl Glycol-Propyl Glycol (SYSTANE OP) Place 1 drop into both eyes as directed. 2-4 times a day   Yes Historical Provider, MD  ranolazine (RANEXA) 1000 MG SR tablet Take 1 tablet (1,000 mg total) by mouth 2 (two) times daily. 07/30/15  Yes Thayer Headings, MD  warfarin (COUMADIN) 4 MG tablet Take 0.5-1 tablets (2-4 mg total) by mouth daily. Take 2 mg on Tues / Thurs ** Take 4 mg on Sun / Mon / Wed / Fri / Sat  START ON 09/18/14-Wednesday-MAKE SURE YOU GO TO YOUR PRIMARY DOCTOR'S OFFICE FOR CBC/INR CHECK WITHIN 1 WEEK Patient taking differently: Take 4 mg by mouth daily.  09/18/14  Yes Shanker Kristeen Mans, MD    Physical Exam: Filed Vitals:   08/09/15 2227 08/09/15 2245 08/09/15 2330 08/10/15 0034  BP:  147/97 164/83 173/92  Pulse:  72 75 40  Resp:  24 17 20   Height: 5\' 9"  (1.753 m)     Weight: 71.215 kg (157 lb)     SpO2:  95% 97% 94%   General: Not in acute distress HEENT:       Eyes: PERRL, EOMI, no scleral icterus or conjunctival pallor.       ENT: No discharge from the ears or nose, no pharyngeal ulcers, moist oral mucosa.        Neck: No JVD, no bruit, no appreciable mass Heme: No cervical adenopathy, no pallor Cardiac: S1/S2, RRR, No murmurs, No gallops or rubs. Pulm: Good air movement bilaterally. No rales, wheezing, rhonchi or rubs. Abd: Soft, nondistended, nontender, no rebound pain or gaurding, BS present. Ext: No LE edema bilaterally. 2+DP/PT pulse bilaterally. Musculoskeletal: No gross deformity, no red, hot, swollen joints. Posterior left shoulder tender, empty-can and lift-off tests negative.   Skin: No rashes or wounds on exposed surfaces  Neuro: Alert, oriented X3, cranial nerves II-XII grossly intact. No focal findings Psych: Patient is not overtly psychotic, appropriate mood and affect.  Labs on Admission:  Basic Metabolic Panel:  Recent Labs Lab 08/09/15 2241  NA 138  K 4.6  CL 108  CO2 22   GLUCOSE 80  BUN 13  CREATININE 1.00  CALCIUM 8.9   Liver Function Tests: No results for input(s): AST, ALT, ALKPHOS, BILITOT, PROT, ALBUMIN in the last 168 hours. No results for input(s): LIPASE, AMYLASE in the last 168 hours. No results for input(s): AMMONIA in the last 168  hours. CBC:  Recent Labs Lab 08/09/15 2241  WBC 4.7  HGB 12.8*  HCT 37.1*  MCV 101.9*  PLT 119*   Cardiac Enzymes: No results for input(s): CKTOTAL, CKMB, CKMBINDEX, TROPONINI in the last 168 hours.  BNP (last 3 results) No results for input(s): BNP in the last 8760 hours.  ProBNP (last 3 results) No results for input(s): PROBNP in the last 8760 hours.  CBG: No results for input(s): GLUCAP in the last 168 hours.  Radiological Exams on Admission: Dg Chest 2 View  08/09/2015  CLINICAL DATA:  Brought via EMS for c/o chest left shoulder, neck, and arm pain that started 1-2 days ago. No hx of HTN or diabetes. Pt is a non smoker. EXAM: CHEST  2 VIEW COMPARISON:  09/11/2014 FINDINGS: Hyperinflation. Midline trachea. Normal heart size and mediastinal contours for age. No pleural effusion or pneumothorax. Moderate to marked pulmonary interstitial thickening is similar. Somewhat more focal lateral right upper and right infrahilar opacities are similar and favored to represent areas of scarring. No at new areas of consolidation. IMPRESSION: Moderate chronic pulmonary interstitial thickening likely related to smoking/chronic bronchitis. More focal areas of presumed scarring within the right lung. No acute superimposed process. Electronically Signed   By: Abigail Miyamoto M.D.   On: 08/09/2015 23:42    EKG: Independently reviewed.  Abnormal findings: Sinus rhythm, RBBB, non-specific ST-T change in inferior leads  Assessment/Plan  1. Chest pain  - Intermittent for the past 2 days and responsive to NTG  - Pain has resolved with NTG prior to arrival; ASA 324 mg given in ED  - Lopressor 5 mg IV given for associated HTN;  high-intensity statin given  - Admission EKG features sinus rhythm with RBBB and non-specific ST-T changes in inferior leads  - Initial troponin is reassuring at 0.01  - Monitor on telemetry for ischemic changes  - Obtain serial troponin measurements, EKGs  - Keep NPO after 4am should stress testing or cath be indicated   2. CAD - DES placed to mid-LAD on 08/05/10  - Non-STEMI in April '15 followed by cath with demonstration of widely patent stent, residual 60% ostial PDA, <20% Cfx, small vessel dz; med management was advised  - Currently evaluating for possible ACS as above   - Continue Coreg, Imdur, Lipitor, lisinopril, prn NTG   3. Chronic systolic CHF - TTE (99991111) with EF 35-40%, moderate AS  - EF improved to 55-60% on cath in April '15  - Appears to be well-compensated    4. Hypertension  - Hypertensive to 180s/100 in ED  - Given x1 metoprolol 5 mg IVP  - Continue home-dose Coreg, lisinopril  5. Atrial fibrillation, paroxysmal  - In sinus rhythm on admission  - CHADS-VASc score is 63 (age x2, CHF, HTN, DM)  - Anticoagulated with warfarin, INR pending  - Continue warfarin with pharmacy to dose   - Rate is well-controlled with Coreg  6. Hyperlipidemia  - LDL 70, HDL 40 in April '15, a good profile  - Continue Lipitor 40 mg qHS   7. Anemia, thrombocytopenia  - Hgb 12.8 with MCV 101.9  - B12 and folate levels wnl in 2007, will repeat  - Iron studies wnl in 2011, will repeat  - Platelet count 119,000 on admission, chronic, etio unkn - Concern has been raised for possible MDS given cancer hx treated with chemo, radiation   8. Colon cancer with metastasis to abdominal nodes  - Previously follow with Dr. Beryle Beams, but has "graduated,"  no sign of residual or recurrent disease    9. Type II DM  - Using glimiperide and Tradjenta at home, will hold these while admitted  - A1c was 7.4% in April '15, reflecting sub-optimal control at that time  - Check CBG with meals and qHS   - Low-intensity SSI correctional initiated, adjust prn  - Carb-modified diet when appropriate    DVT ppx: Continue warfarin  Code Status: Full code Family Communication: None at bed side.          Disposition Plan: Admit to inpatient   Date of Service 08/10/2015    Vianne Bulls, MD [facility] Pager [phone]  If 7PM-7AM, please contact night-coverage [email or web address] Password [email or web address] 08/10/2015, 1:32 AM

## 2015-08-10 NOTE — Progress Notes (Signed)
ANTICOAGULATION CONSULT NOTE - Initial Consult  Pharmacy Consult for Coumadin Indication: Atrial fibrillation  Allergies  Allergen Reactions  . Metformin And Related Nausea And Vomiting    Patient Measurements: Height: 5\' 9"  (175.3 cm) Weight: 157 lb (71.215 kg) IBW/kg (Calculated) : 70.7  Vital Signs: Temp: 98 F (36.7 C) (04/02 0146) Temp Source: Oral (04/02 0146) BP: 179/80 mmHg (04/02 0146) Pulse Rate: 58 (04/02 0146)  Labs:  Recent Labs  08/09/15 2241 08/10/15 0158  HGB 12.8*  --   HCT 37.1*  --   PLT 119*  --   LABPROT  --  30.0*  INR  --  2.92*  CREATININE 1.00  --     Estimated Creatinine Clearance: 49.1 mL/min (by C-G formula based on Cr of 1).   Medical History: Past Medical History  Diagnosis Date  . Coronary artery disease     a. s/p PCI w/ DES to mLAD 08/05/10. b. NSTEMI 08/2013 (mildly elev trop) - cath showing widely patent stent, 80% prox D1 (small vessel) with recommendation for medical management, residual 60% ostial PDA, <20% LCx, LVEF 55-65%.   . Hypertension   . Hyperlipidemia   . Barrett esophagus   . Gallstone pancreatitis 2011    a. s/p chole.  . SBO (small bowel obstruction) ([place]) 2005  . Macrocytic anemia 06/25/2011  . PAF (paroxysmal atrial fibrillation) ([place])     a. failed TEE due to inability to pass probe into the esophagus in March 2013. b. On Coumadin. Was in NSR 08/2013 admission.  . Chronic anticoagulation   . Depression   . GERD (gastroesophageal reflux disease)   . Colon cancer ([place])     a. 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 b.  "graduated" from their practice 06/2013, remains free of any new disease per those notes.  . Type II diabetes mellitus ([place])   . Premature atrial contractions   . Premature ventricular contractions   . Aortic stenosis     a. Mild-mod by echo 2013.  [name] Chronic systolic CHF (congestive heart failure) ([facility])     a. EF previously 35-40%. b. improved to 55-65% by cath 08/2013.  .  Myelodysplastic disease ([place])     a. Suspected low-grade  myelodysplastic disease per onc notes.  . Other pancytopenia ([place]) 06/18/2014    Mild, fluctuating, likely med related. Rule out early MDS from prior chemo/RT or colon cancer    Medications:  Prescriptions prior to admission  Medication Sig Dispense Refill Last Dose  . atorvastatin (LIPITOR) 40 MG tablet TAKE ONE TABLET BY MOUTH ONCE DAILY AT 6PM 90 tablet 0 08/09/2015 at Unknown time  . carvedilol (COREG) 25 MG tablet TAKE ONE TABLET BY MOUTH TWICE DAILY WITH FOOD 60 tablet 11 08/09/2015 at 1000  . Cholecalciferol (VITAMIN D-3 PO) Take 1 tablet by mouth daily.    08/09/2015 at Unknown time  . donepezil (ARICEPT) 5 MG tablet Take 5 mg by mouth at bedtime.    08/08/2015 at Unknown time  . finasteride (PROSCAR) 5 MG tablet Take 5 mg by mouth at bedtime.    08/08/2015 at Unknown time  . glimepiride (AMARYL) 4 MG tablet Take 4 mg by mouth daily after breakfast.    08/09/2015 at Unknown time  . isosorbide mononitrate (IMDUR) 60 MG 24 hr tablet Take 1.5 tablets (90 mg total) by mouth daily. 135 tablet 3 08/09/2015 at Unknown time  . linagliptin (TRADJENTA) 5 MG TABS tablet Take 5 mg by mouth daily. For blood sugar  08/09/2015 at Unknown time  . lisinopril (PRINIVIL,ZESTRIL) 5 MG tablet Take 5 mg by mouth daily.   08/09/2015 at Unknown time  . loratadine (CLARITIN) 10 MG tablet Take 10 mg by mouth daily as needed for allergies.   unk  . Melatonin (RA MELATONIN) 10 MG TABS Take 10 mg by mouth at bedtime.    08/08/2015 at Unknown time  . Multiple Vitamins-Minerals (PRESERVISION/LUTEIN) CAPS Take 1 capsule by mouth daily.   0 08/09/2015 at Unknown time  . nitroGLYCERIN (NITROSTAT) 0.4 MG SL tablet Place 1 tablet (0.4 mg total) under the tongue every 5 (five) minutes as needed for chest pain. 100 tablet 5 08/09/2015 at Unknown time  . omeprazole (PRILOSEC) 20 MG capsule Take 20 mg by mouth daily.   08/09/2015 at Unknown time  . Polyethyl Glycol-Propyl Glycol (SYSTANE OP)  Place 1 drop into both eyes as directed. 2-4 times a day   08/09/2015 at Unknown time  . ranolazine (RANEXA) 1000 MG SR tablet Take 1 tablet (1,000 mg total) by mouth 2 (two) times daily. 60 tablet 11 08/09/2015 at am  . warfarin (COUMADIN) 4 MG tablet Take 0.5-1 tablets (2-4 mg total) by mouth daily. Take 2 mg on Tues / Thurs ** Take 4 mg on Sun / Mon / Wed / Fri / Sat  START ON 09/18/14-Wednesday-MAKE SURE YOU GO TO YOUR PRIMARY DOCTOR'S OFFICE FOR CBC/INR CHECK WITHIN 1 WEEK (Patient taking differently: Take 4 mg by mouth daily. )   08/09/2015 at Unknown time    Assessment: 82 y.o. male admitted with chest pain, h/o Afib, to continue Coumadin  Goal of Therapy:  INR 2-3 Monitor platelets by anticoagulation protocol: Yes   Plan:  Continue Coumadin 4 mg daily Daily INR  Barrington Worley, Bronson Curb 08/10/2015,2:59 AM

## 2015-08-10 NOTE — Consult Note (Signed)
CARDIOLOGY CONSULT NOTE  Patient ID: [name] MRN: [id number] DOB/AGE: 80-Dec-1925 80 y.o.  Admit date: 08/09/2015 Primary Physician Irven Shelling, MD Primary Cardiologist Dr. [name] Chief Complaint  Chest pain.   Requesting  Dr. Myna Hidalgo  HPI:  The patient has a history of CAD as below.  Long history of chest pain with residual small vessel disease on cath in 2015 managed medically.  Followed closely by Dr. [name].    Was having chest pain at the last office appt last month.    He presents with arm pain.  This started about a week ago in his left shoulder.  It was on and off but yesterday it progressed to be constant.  He has not had this kind of pain before.   He has had some mild chest pain but that was not the primary problem.  He now has constant 8/10 should soreness that is reproducible with palpation.  There is clear point tenderness.  He did have shoulder Xrays this morning that demonstrates some degenerative changes.  He did not have any overuse or trauma.  He does not describe any associated symptoms.  He has had no SOB, N/V, palpitations or diaphoresis.    Past Medical History  Diagnosis Date  . Coronary artery disease     a. s/p PCI w/ DES to mLAD 08/05/10. b. NSTEMI 08/2013 (mildly elev trop) - cath showing widely patent stent, 80% prox D1 (small vessel) with recommendation for medical management, residual 60% ostial PDA, <20% LCx, LVEF 55-65%.   . Hypertension   . Hyperlipidemia   . Barrett esophagus   . Gallstone pancreatitis 2011    a. s/p chole.  . SBO (small bowel obstruction) ([place]) 2005  . Macrocytic anemia 06/25/2011  . PAF (paroxysmal atrial fibrillation) ([place])     a. failed TEE due to inability to pass probe into the esophagus in March 2013. b. On Coumadin. Was in NSR 08/2013 admission.  . Chronic anticoagulation   . Depression   . GERD (gastroesophageal reflux disease)   . Colon cancer ([place])     a. 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 b.   "graduated" from their practice 06/2013, remains free of any new disease per those notes.  . Type II diabetes mellitus ([place])   . Premature atrial contractions   . Premature ventricular contractions   . Aortic stenosis     a. Mild-mod by echo 2013.  [name] Chronic systolic CHF (congestive heart failure) ([facility])     a. EF previously 35-40%. b. improved to 55-65% by cath 08/2013.  . Myelodysplastic disease ([place])     a. Suspected low-grade  myelodysplastic disease per onc notes.  . Other pancytopenia ([place]) 06/18/2014    Mild, fluctuating, likely med related. Rule out early MDS from prior chemo/RT or colon cancer    Past Surgical History  Procedure Laterality Date  . Cholecystectomy  2011  . Right colectomy    . Coronary angioplasty with stent placement  08/05/10    DES to the LAD  . Cardiac catheterization  08/03/10  . Cardiac catheterization  08/09/10    LAD 30, stent OK, CFX 40, RCA < 20  . Abdominal mass resection      Mass near mesentery  . Cystoscopy      "with removal of kidney stone in office"  . Cataract extraction Bilateral   . Appendectomy      "took out during colon surgery"  . Cystoscopy with retrograde pyelogram, ureteroscopy and  stent placement Bilateral 05/18/2013    Procedure: CYSTOSCOPY WITH BILATERAL RETROGRADE PYELOGRAM, LEFT URETEROSCOPY AND LEFT STENT PLACEMENT;  Surgeon: Alexis Frock, MD;  Location: [facility];  Service: Urology;  Laterality: Bilateral;  . Colon surgery    . Tonsillectomy    . Left heart catheterization with coronary angiogram N/A 08/15/2013    Procedure: LEFT HEART CATHETERIZATION WITH CORONARY ANGIOGRAM;  Surgeon: Peter M Martinique, MD;  Location: [facility] CATH LAB;  Service: Cardiovascular;  Laterality: N/A;    Allergies  Allergen Reactions  . Metformin And Related Nausea And Vomiting   Prescriptions prior to admission  Medication Sig Dispense Refill Last Dose  . atorvastatin (LIPITOR) 40 MG tablet TAKE ONE TABLET BY MOUTH ONCE DAILY AT 6PM 90 tablet 0 08/09/2015 at  Unknown time  . carvedilol (COREG) 25 MG tablet TAKE ONE TABLET BY MOUTH TWICE DAILY WITH FOOD 60 tablet 11 08/09/2015 at 1000  . Cholecalciferol (VITAMIN D-3 PO) Take 1 tablet by mouth daily.    08/09/2015 at Unknown time  . donepezil (ARICEPT) 5 MG tablet Take 5 mg by mouth at bedtime.    08/08/2015 at Unknown time  . finasteride (PROSCAR) 5 MG tablet Take 5 mg by mouth at bedtime.    08/08/2015 at Unknown time  . glimepiride (AMARYL) 4 MG tablet Take 4 mg by mouth daily after breakfast.    08/09/2015 at Unknown time  . isosorbide mononitrate (IMDUR) 60 MG 24 hr tablet Take 1.5 tablets (90 mg total) by mouth daily. 135 tablet 3 08/09/2015 at Unknown time  . linagliptin (TRADJENTA) 5 MG TABS tablet Take 5 mg by mouth daily. For blood sugar   08/09/2015 at Unknown time  . lisinopril (PRINIVIL,ZESTRIL) 5 MG tablet Take 5 mg by mouth daily.   08/09/2015 at Unknown time  . loratadine (CLARITIN) 10 MG tablet Take 10 mg by mouth daily as needed for allergies.   unk  . Melatonin (RA MELATONIN) 10 MG TABS Take 10 mg by mouth at bedtime.    08/08/2015 at Unknown time  . Multiple Vitamins-Minerals (PRESERVISION/LUTEIN) CAPS Take 1 capsule by mouth daily.   0 08/09/2015 at Unknown time  . nitroGLYCERIN (NITROSTAT) 0.4 MG SL tablet Place 1 tablet (0.4 mg total) under the tongue every 5 (five) minutes as needed for chest pain. 100 tablet 5 08/09/2015 at Unknown time  . omeprazole (PRILOSEC) 20 MG capsule Take 20 mg by mouth daily.   Past Month at Unknown time  . Polyethyl Glycol-Propyl Glycol (SYSTANE OP) Place 1 drop into both eyes as directed. 2-4 times a day   08/09/2015 at Unknown time  . ranolazine (RANEXA) 1000 MG SR tablet Take 1 tablet (1,000 mg total) by mouth 2 (two) times daily. 60 tablet 11 08/09/2015 at am  . warfarin (COUMADIN) 4 MG tablet Take 0.5-1 tablets (2-4 mg total) by mouth daily. Take 2 mg on Tues / Thurs ** Take 4 mg on Sun / Mon / Wed / Fri / Sat  START ON 09/18/14-Wednesday-MAKE SURE YOU GO TO YOUR PRIMARY  DOCTOR'S OFFICE FOR CBC/INR CHECK WITHIN 1 WEEK (Patient taking differently: Take 4 mg by mouth daily. )   08/09/2015 at Unknown time   Family History  Problem Relation Age of Onset  . Cancer Mother   . Ulcers Father   . Heart attack Brother     Social History   Social History  . Marital Status: Single    Spouse Name: N/A  . Number of Children: N/A  . Years of  Education: N/A   Occupational History  . Not on file.   Social History Main Topics  . Smoking status: Former Smoker -- 1.00 packs/day for 30 years    Types: Cigarettes    Quit date: 05/10/1994  . Smokeless tobacco: Never Used  . Alcohol Use: 1.2 oz/week    2 Shots of liquor per week     Comment: occasionally.  . Drug Use: No  . Sexual Activity: No   Other Topics Concern  . Not on file   Social History Narrative     ROS:    As stated in the HPI and negative for all other systems.  Physical Exam: Blood pressure 159/46, pulse 73, temperature 100 F (37.8 C), temperature source Oral, resp. rate 20, height 5\' 9"  (1.753 m), weight 149 lb 9.6 oz (67.858 kg), SpO2 95 %.  GENERAL:  Well appearing HEENT:  Pupils equal round and reactive, fundi not visualized, oral mucosa unremarkable NECK:  No jugular venous distention, waveform within normal limits, carotid upstroke brisk and symmetric, no bruits, no thyromegaly LYMPHATICS:  No cervical, inguinal adenopathy LUNGS:  Clear to auscultation bilaterally BACK:  No CVA tenderness CHEST:  Unremarkable HEART:  PMI not displaced or sustained,S1 and S2 within normal limits, no S3, no S4, no clicks, no rubs, 2/6 apical early peaking systolic murmur, no diatolic murmurs ABD:  Flat, positive bowel sounds normal in frequency in pitch, no bruits, no rebound, no guarding, no midline pulsatile mass, no hepatomegaly, no splenomegaly EXT:  2 plus pulses throughout, no edema, no cyanosis no clubbing SKIN:  No rashes no nodules NEURO:  Cranial nerves II through XII grossly intact, motor  grossly intact throughout PSYCH:  Cognitively intact, oriented to person place and time   Labs: Lab Results  Component Value Date   BUN 13 08/09/2015   Lab Results  Component Value Date   CREATININE 1.00 08/09/2015   Lab Results  Component Value Date   NA 138 08/09/2015   K 4.6 08/09/2015   CL 108 08/09/2015   CO2 22 08/09/2015   Lab Results  Component Value Date   TROPONINI 0.06* 08/10/2015   Lab Results  Component Value Date   WBC 4.7 08/09/2015   HGB 12.8* 08/09/2015   HCT 37.1* 08/09/2015   MCV 101.9* 08/09/2015   PLT 119* 08/09/2015   Lab Results  Component Value Date   CHOL 119 08/07/2013   HDL 39.50 08/07/2013   LDLCALC 70 08/07/2013   TRIG 50.0 08/07/2013   CHOLHDL 3 08/07/2013   Lab Results  Component Value Date   ALT 27 06/07/2014   AST 35 06/07/2014   ALKPHOS 40 06/07/2014   BILITOT 0.6 06/07/2014     Radiology:   CXR:   Moderate chronic pulmonary interstitial thickening likely related to smoking/chronic bronchitis. More focal areas of presumed scarring within the right lung.   No acute superimposed process.  EKG:    NSR, with PACs,  RBBB, no acute ST T wave changes.   ASSESSMENT AND PLAN:   CHEST PAIN:   Long history of this.  Troponin is nondiagnostic.  His pain is noncardiac.  The shoulder is the most significant complaint.   No in patient testing from a cardiac standpoint is indicated.  Continue current meds.    CARDIOMYOPATHY:  EF had been low in the past.  Most recently it was upt o 55 - 60% in 2015.  No suggestion of recurrent CHF.    ATRIAL FIB:   Continue warfarin.  HTN:   BP was not controlled in the ED.    AS:  Mild.    SignedMinus Breeding 08/10/2015, 10:24 AM

## 2015-08-10 NOTE — Progress Notes (Signed)
Pt discharging home.  All instructions given a reviewed.  Records release form signed and faxed by request.  Pt has follow up with PCP scheduled for tomorrow.

## 2015-08-10 NOTE — Progress Notes (Signed)
Pt arrived to unit. VSS. Pt complaining of L shoulder/ neck pain. Will address with medication. Pt oriented to unit. Will continue to monitor closely.  Raliegh Ip RN

## 2015-08-10 NOTE — Discharge Summary (Signed)
[name], is a 82 y.o. male  DOB 04-22-1924  MRN [id number].  Admission date:  08/09/2015  Admitting Physician  Vianne Bulls, MD  Discharge Date:  08/10/2015   Primary MD  Irven Shelling, MD  Recommendations for primary care physician for things to follow:   Monitor secondary risk factors for CAD, outpatient cardiology follow-up for chest pain and orthopedics follow-up for left shoulder pain   Admission Diagnosis  Chest pain, unspecified chest pain type [R07.9]   Discharge Diagnosis  Chest pain, unspecified chest pain type [R07.9]     Principal Problem:   Chest pain Active Problems:   Hypertension   Diabetes mellitus ([place])   Hyperlipidemia   Coronary artery disease   Macrocytic anemia   Paroxysmal atrial fibrillation (HCC)   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 Coronary artery disease     a. s/p PCI w/ DES to mLAD 08/05/10. b. NSTEMI 08/2013 (mildly elev trop) - cath showing widely patent stent, 80% prox D1 (small vessel) with recommendation for medical management, residual 60% ostial PDA, <20% LCx, LVEF 55-65%.   . Hypertension   . Hyperlipidemia   . Barrett esophagus   . Gallstone pancreatitis 2011    a. s/p chole.  . SBO (small bowel obstruction) ([place]) 2005  . Macrocytic anemia 06/25/2011  . PAF (paroxysmal atrial fibrillation) ([place])     a. failed TEE due to inability to pass probe into the esophagus in March 2013. b. On Coumadin. Was in NSR 08/2013 admission.  . Chronic anticoagulation   . Depression   . GERD (gastroesophageal reflux disease)   . Colon cancer ([place])      a. 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 b.  "graduated" from their practice 06/2013, remains free of any new disease per those notes.  . Type II diabetes mellitus ([place])   . Premature atrial contractions   . Premature ventricular contractions   . Aortic stenosis     a. Mild-mod by echo 2013.  [name] Chronic systolic CHF (congestive heart failure) (HCC)     a. EF previously 35-40%. b. improved to 55-65% by cath 08/2013.  . Myelodysplastic disease ([place])     a. Suspected low-grade  myelodysplastic disease per onc notes.  . Other pancytopenia ([place]) 06/18/2014    Mild, fluctuating, likely med related. Rule out early MDS from prior chemo/RT or colon cancer    Past Surgical History  Procedure Laterality Date  . Cholecystectomy  2011  . Right colectomy    . Coronary angioplasty with stent placement  08/05/10    DES to the LAD  . Cardiac catheterization  08/03/10  . Cardiac catheterization  08/09/10    LAD 30, stent OK, CFX 40, RCA < 20  . Abdominal mass resection  Mass near mesentery  . Cystoscopy      "with removal of kidney stone in office"  . Cataract extraction Bilateral   . Appendectomy      "took out during colon surgery"  . Cystoscopy with retrograde pyelogram, ureteroscopy and stent placement Bilateral 05/18/2013    Procedure: CYSTOSCOPY WITH BILATERAL RETROGRADE PYELOGRAM, LEFT URETEROSCOPY AND LEFT STENT PLACEMENT;  Surgeon: Alexis Frock, MD;  Location: [facility];  Service: Urology;  Laterality: Bilateral;  . Colon surgery    . Tonsillectomy    . Left heart catheterization with coronary angiogram N/A 08/15/2013    Procedure: LEFT HEART CATHETERIZATION WITH CORONARY ANGIOGRAM;  Surgeon: Peter M Martinique, MD;  [facility];  Service: Cardiovascular;  Laterality: N/A;       HPI  from the history and physical done on the day of admission:    [name] is a 81 y.o. male with PMH of hypertension, type 2 diabetes mellitus, paroxysmal atrial fibrillation on  Coumadin, coronary artery disease with stents, and chronic systolic CHF who presents to the ED with 1-2 days of intermittent chest pain. Patient describes his pain as substernal, pressure-like, and moderate in intensity. Pain was reminiscent of his prior MI. There was concomitant pain in the left posterior shoulder, traveling down his arm, which she had not experienced previously. Patient took nitroglycerin for his pain today, a total of 6 doses, and had immediate resolution of his chest pain with this. The posterior left shoulder pain persisted throughout the day despite the nitroglycerin. He denies any associated shortness of breath, diaphoresis, or nausea. There were no palpitations. He follows regularly with outpatient cardiologist, Dr. [name]. Patient had a drug-eluting stent placed to the mid LAD in March 2012, subsequently suffered another MI in April 2015 and catheterization at that time demonstrated a widely patent stent, noncritical occlusion of the ostial PDA and left circumflex, and small vessel disease. Medical management was recommended at that time. Ejection fraction on the 2015 catheterization was estimated to be 55-60%, improved from an echocardiogram 2 years prior.  In ED, patient was found to be saturating well on room air with vital signs stable. His pain had resolved prior to arrival. EKG feature to sinus rhythm with right bundle branch block and some nonspecific ST-T wave abnormality in the inferior leads. Chest x-ray was negative for acute cardiopulmonary disease. Initial troponin was 0.01. BMP is unremarkable and CBC features a hemoglobin of 12.8 with MCV of 102. Platelet count is 119,000. Patient was given a 324 mg aspirin in the emergency department. He was monitored on telemetry in the ED, became hypertensive, but remained hemodynamically stable. He'll be admitted to the telemetry unit for ongoing evaluation and management of chest pain concerning for possible ACS.     Hospital  Course:     1. Left shoulder pain and some atypical chest discomfort in a patient with CAD, chronic systolic heart failure and paroxysmal atrial fibrillation. Seen by cardiology, no further chest pain workup, cardiology recommends to continue home medications with outpatient cartilage surgery follow-up. Patient is chest pain-free, he predominantly had left shoulder pain and tenderness, x-ray of the left shoulder suggestive of some before meals degenerative changes. Recommend outpatient Ortho Evra and cardiology follow-up. From a cardiac standpoint he was well compensated, troponin mild rise in non-ACS pattern. Per cardiology no further workup and discharge home.  2. Paroxysmal atrial fibrillation. [place] vasc 2 score of close to 4. Continue beta blocker and Coumadin, PCP to monitor INR.  3.  Chr. systolic heart failure. Last EF 40%. Compensated. Continue home medications.  4. Essential hypertension. Blood pressure stable home medications continued unchanged.  5. Dyslipidemia. Continue home dose Lipitor.  6.Dm2 - continue home regimen.  7. Chr Anemia, Thrombocytopenia - follow with PCP.  8.Colon cancer with metastasis to abdominal nodes - follow with Dr Beryle Beams and PCP.    Discharge Condition: Stable  Follow UP  Follow-up Information    Follow up with Irven Shelling, MD. Schedule an appointment as soon as possible for a visit in 2 days.   Specialty:  Internal Medicine   Contact information:   [address] [place] [address] [phone]       Follow up with Nahser, Wonda Cheng, MD. Schedule an appointment as soon as possible for a visit in 1 week.   Specialty:  Cardiology   Contact information:   [address] [place] [address] [phone]       Follow up with Marianna Payment, MD. Schedule an appointment as soon as possible for a visit in 3 days.   Specialty:  Orthopedic Surgery   Why:  L shoulder pain   Contact information:    [place] [id number] [phone]        Consults obtained - Cards  Diet and Activity recommendation: See Discharge Instructions below  Discharge Instructions       Discharge Instructions    Discharge instructions    Complete by:  As directed   Follow with Primary MD Irven Shelling, MD in 2-3 days   Get CBC, CMP, 2 view Chest X ray checked  by Primary MD next visit.    Activity: As tolerated with Full fall precautions use walker/cane & assistance as needed   Disposition Home    Diet:   Heart Healthy  Low Carb. Check your Weight same time everyday, if you gain over 2 pounds, or you develop in leg swelling, experience more shortness of breath or chest pain, call your Primary MD immediately. Follow Cardiac Low Salt Diet and 1.5 lit/day fluid restriction.   On your next visit with your primary care physician please Get Medicines reviewed and adjusted.   Please request your Prim.MD to go over all Hospital Tests and Procedure/Radiological results at the follow up, please get all Hospital records sent to your Prim MD by signing hospital release before you go home.   If you experience worsening of your admission symptoms, develop shortness of breath, life threatening emergency, suicidal or homicidal thoughts you must seek medical attention immediately by calling 911 or calling your MD immediately  if symptoms less severe.  You Must read complete instructions/literature along with all the possible adverse reactions/side effects for all the Medicines you take and that have been prescribed to you. Take any new Medicines after you have completely understood and accpet all the possible adverse reactions/side effects.   Do not drive, operating heavy machinery, perform activities at heights, swimming or participation in water activities or provide baby sitting services if your were admitted for syncope or siezures until you have seen by Primary MD or a Neurologist  and advised to do so again.  Do not drive when taking Pain medications.    Do not take more than prescribed Pain, Sleep and Anxiety Medications  Special Instructions: If you have smoked or chewed Tobacco  in the last 2 yrs please stop smoking, stop any regular Alcohol  and or any Recreational drug use.  Wear Seat belts while driving.   Please note  You were cared for by a hospitalist during your hospital stay. If you have any questions about your discharge medications or the care you received while you were in the hospital after you are discharged, you can call the unit and asked to speak with the hospitalist on call if the hospitalist that took care of you is not available. Once you are discharged, your primary care physician will handle any further medical issues. Please note that NO REFILLS for any discharge medications will be authorized once you are discharged, as it is imperative that you return to your primary care physician (or establish a relationship with a primary care physician if you do not have one) for your aftercare needs so that they can reassess your need for medications and monitor your lab values.     Increase activity slowly    Complete by:  As directed              Discharge Medications       Medication List    TAKE these medications        atorvastatin 40 MG tablet  Commonly known as:  LIPITOR  TAKE ONE TABLET BY MOUTH ONCE DAILY AT 6PM     carvedilol 25 MG tablet  Commonly known as:  COREG  TAKE ONE TABLET BY MOUTH TWICE DAILY WITH FOOD     donepezil 5 MG tablet  Commonly known as:  ARICEPT  Take 5 mg by mouth at bedtime.     finasteride 5 MG tablet  Commonly known as:  PROSCAR  Take 5 mg by mouth at bedtime.     glimepiride 4 MG tablet  Commonly known as:  AMARYL  Take 4 mg by mouth daily after breakfast.     isosorbide mononitrate 60 MG 24 hr tablet  Commonly known as:  IMDUR  Take 1.5 tablets (90 mg total) by mouth daily.      linagliptin 5 MG Tabs tablet  Commonly known as:  TRADJENTA  Take 5 mg by mouth daily. For blood sugar     lisinopril 5 MG tablet  Commonly known as:  PRINIVIL,ZESTRIL  Take 5 mg by mouth daily.     loratadine 10 MG tablet  Commonly known as:  CLARITIN  Take 10 mg by mouth daily as needed for allergies.     naproxen 250 MG tablet  Commonly known as:  NAPROSYN  Take 1 tablet (250 mg total) by mouth 3 (three) times daily with meals as needed.     nitroGLYCERIN 0.4 MG SL tablet  Commonly known as:  NITROSTAT  Place 1 tablet (0.4 mg total) under the tongue every 5 (five) minutes as needed for chest pain.     omeprazole 20 MG capsule  Commonly known as:  PRILOSEC  Take 20 mg by mouth daily.     PRESERVISION/LUTEIN Caps  Take 1 capsule by mouth daily.     RA MELATONIN 10 MG Tabs  Generic drug:  Melatonin  Take 10 mg by mouth at bedtime.     ranolazine 1000 MG SR tablet  Commonly known as:  RANEXA  Take 1 tablet (1,000 mg total) by mouth 2 (two) times daily.     SYSTANE OP  Place 1 drop into both eyes as directed. 2-4 times a day     VITAMIN D-3 PO  Take 1 tablet by mouth daily.     warfarin 4 MG tablet  Commonly known as:  COUMADIN  Take 0.5-1 tablets (2-4 mg total) by mouth daily. Take 2 mg on Tues / Thurs ** Take 4 mg on Sun / Mon / Wed / Fri / Sat  START ON 09/18/14-Wednesday-MAKE SURE YOU GO TO YOUR PRIMARY DOCTOR'S OFFICE FOR CBC/INR CHECK WITHIN 1 WEEK        Major procedures and Radiology Reports - PLEASE review detailed and final reports for all details, in brief -       Dg Chest 2 View  08/09/2015  CLINICAL DATA:  Brought via EMS for c/o chest left shoulder, neck, and arm pain that started 1-2 days ago. No hx of HTN or diabetes. Pt is a non smoker. EXAM: CHEST  2 VIEW COMPARISON:  09/11/2014 FINDINGS: Hyperinflation. Midline trachea. Normal heart size and mediastinal contours for age. No pleural effusion or pneumothorax. Moderate to marked pulmonary interstitial  thickening is similar. Somewhat more focal lateral right upper and right infrahilar opacities are similar and favored to represent areas of scarring. No at new areas of consolidation. IMPRESSION: Moderate chronic pulmonary interstitial thickening likely related to smoking/chronic bronchitis. More focal areas of presumed scarring within the right lung. No acute superimposed process. Electronically Signed   By: Abigail Miyamoto M.D.   On: 08/09/2015 23:42   Dg Shoulder Left  08/10/2015  CLINICAL DATA:  Left shoulder pain for 3 weeks. EXAM: LEFT SHOULDER - 2+ VIEW COMPARISON:  None. FINDINGS: Minor AC joint degenerative change. Normal alignment. No acute fracture, subluxation or dislocation. Bones are osteopenic. Left upper lobe is clear. IMPRESSION: AC joint degenerative change.  No other acute osseous finding. Electronically Signed   By: Jerilynn Mages.  Shick M.D.   On: 08/10/2015 09:26    Micro Results      No results found for this or any previous visit (from the past 240 hour(s)).     Today   Subjective    Carlon Winings today has no headache,no chest abdominal pain,no new weakness tingling or numbness, feels much better wants to go home today.     Objective   Blood pressure 159/46, pulse 73, temperature 100 F (37.8 C), temperature source Oral, resp. rate 20, height 5\' 9"  (1.753 m), weight 67.858 kg (149 lb 9.6 oz), SpO2 95 %.  No intake or output data in the 24 hours ending 08/10/15 1151  Exam Awake Alert, Oriented x 3, No new F.N deficits, Normal affect [place].AT,PERRAL Supple Neck,No JVD, No cervical lymphadenopathy appriciated.  Symmetrical Chest wall movement, Good air movement bilaterally, CTAB RRR,No Gallops,Rubs or new Murmurs, No Parasternal Heave +ve B.Sounds, Abd Soft, Non tender, No organomegaly appriciated, No rebound -guarding or rigidity. No Cyanosis, Clubbing or edema, No new Rash or bruise   Data Review   CBC w Diff:  Lab Results  Component Value Date   WBC 4.7 08/09/2015     WBC 6.0 06/15/2013   HGB 12.8* 08/09/2015   HGB 12.8* 06/15/2013   HCT 37.1* 08/09/2015   HCT 37.6* 06/15/2013   PLT 119* 08/09/2015   PLT 141 06/15/2013   LYMPHOPCT 11* 09/11/2014   LYMPHOPCT 16.7 06/15/2013   MONOPCT 11 09/11/2014   MONOPCT 9.2 06/15/2013   EOSPCT 2 09/11/2014   EOSPCT 3.6 06/15/2013   BASOPCT 0 09/11/2014   BASOPCT 0.7 06/15/2013    CMP:  Lab Results  Component Value Date   NA 138 08/09/2015   NA 140 06/15/2013   K 4.6 08/09/2015   K 4.3 06/15/2013   CL 108 08/09/2015   CO2 22 08/09/2015  CO2 25 06/15/2013   BUN 13 08/09/2015   BUN 21.2 06/15/2013   CREATININE 1.00 08/09/2015   CREATININE 1.1 06/15/2013   CREATININE 1.44 07/28/2010   CREATININE 1.54* 06/22/2010   PROT 5.9* 06/07/2014   PROT 6.8 06/15/2013   ALBUMIN 3.0* 06/07/2014   ALBUMIN 3.7 06/15/2013   BILITOT 0.6 06/07/2014   BILITOT 0.37 06/15/2013   ALKPHOS 40 06/07/2014   ALKPHOS 48 06/15/2013   AST 35 06/07/2014   AST 22 06/15/2013   ALT 27 06/07/2014   ALT 18 06/15/2013  .   Total Time in preparing paper work, data evaluation and todays exam - 35 minutes  Thurnell Lose M.D on 08/10/2015 at 11:51 AM  [facility]   Office  [phone]

## 2015-08-11 LAB — FOLATE RBC
FOLATE, HEMOLYSATE: 521.9 ng/mL
Folate, RBC: 1318 ng/mL (ref >498)
Hematocrit: 39.6 % (ref 37.5–51.0)

## 2015-08-11 LAB — HEMOGLOBIN A1C
HEMOGLOBIN A1C: 6.4 % — AB (ref 4.8–5.6)
Mean Plasma Glucose: 137 mg/dL

## 2015-08-15 ENCOUNTER — Other Ambulatory Visit: Payer: Self-pay | Admitting: Cardiovascular Disease

## 2015-08-20 ENCOUNTER — Other Ambulatory Visit: Payer: Self-pay | Admitting: Orthopaedic Surgery

## 2015-08-20 DIAGNOSIS — M25512 Pain in left shoulder: Secondary | ICD-10-CM

## 2015-09-03 ENCOUNTER — Other Ambulatory Visit: Payer: Medicare Other

## 2015-09-09 ENCOUNTER — Ambulatory Visit
Admission: RE | Admit: 2015-09-09 | Discharge: 2015-09-09 | Disposition: A | Discharge status: Home / Self Care | Payer: Medicare Other | Source: Ambulatory Visit | Attending: Orthopaedic Surgery | Admitting: Orthopaedic Surgery

## 2015-09-09 DIAGNOSIS — M25512 Pain in left shoulder: Secondary | ICD-10-CM

## 2015-10-10 ENCOUNTER — Encounter: Payer: Self-pay | Admitting: Cardiovascular Disease

## 2015-10-10 ENCOUNTER — Ambulatory Visit (INDEPENDENT_AMBULATORY_CARE_PROVIDER_SITE_OTHER): Payer: Medicare Other | Admitting: Cardiovascular Disease

## 2015-10-10 VITALS — BP 90/54 | HR 65 | Ht 69.0 in | Wt 155.8 lb

## 2015-10-10 DIAGNOSIS — I480 Paroxysmal atrial fibrillation: Secondary | ICD-10-CM

## 2015-10-10 DIAGNOSIS — I5022 Chronic systolic (congestive) heart failure: Secondary | ICD-10-CM | POA: Diagnosis not present

## 2015-10-10 DIAGNOSIS — I2583 Coronary atherosclerosis due to lipid rich plaque: Principal | ICD-10-CM

## 2015-10-10 DIAGNOSIS — I2510 Atherosclerotic heart disease of native coronary artery without angina pectoris: Secondary | ICD-10-CM | POA: Diagnosis not present

## 2015-10-10 NOTE — Progress Notes (Signed)
Cardiology Office Note   Date:  10/10/2015   ID:  [name], DOB 08-15-1923, MRN [id number]  PCP:  Irven Shelling, MD  Cardiologist:   Mertie Moores, MD   Chief Complaint  Patient presents with  . Coronary Artery Disease   1. CAD - s/p stenting July 2012.  2. Hypertension  3. Hyperlipidemia  4. Diabetes Mellitus  5. Paroxysmal atrial fib.   6. Lower GI bleed -    History of Present Illness:  [name] is seen today for a 2 week check. He has known CAD with prior PCI to the LAD last year. Other issues include chronic PVC's and PACs, HTN, DM, HLD and systolic heart failure. He now has atrial fib and will be managed with rate control and anticoagulation. He had a failed attempt at [place] in March due to inability to pass the probe into the esophagus. His EF is 35 to 40% with mild to moderate AS.  His INR has been therapeutic. He really is not interested in doing cardioversion.  December 23, 2011 - he was initially seen in the emergency room. He has progressive back pain associated with increasing shortness of breath. [name] noticed that if he takes a nitroglycerin at this back pain and his shortness of breath improved. He has these discomforts with minor levels of exertion. He is also having at rest.  March 20, 2012: [name] presented with some episodes of chest pain when I last saw him in August. He had a stress Myoview study that was normal. He still walks almost every day - he goes around [employer] 1-1 1/2 miles a day. He had a brief episode of CP yesterday - relieved with NTG. He had some food poisoning recently ( raw oysters)  Oct 03, 2012:  He has been having some mid abdominal pain with radiation around to the back. He took a NTG with relief. The pain was a dull pain and would last 30 minutes or so. He has been putting in his garden without any without any problems.   Oct. 1, 2014:  [name] is doing ok. He is having problems with his memory.  Has trouble getting the words out sometimes. No CVA symptoms. No focal neuro findings.  No  CP or dyspnea. 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   August 10, 2013:  [name] has been having some chest tightness . Occurs 1-2 times a week. The chest tightness is relieved if he takes a nitroglycerin. he also crushes up a full strength aspirin and takes that. The pains occur with rest. Not associated with eating Or drinking anything. Similar to his previous epidoses of CP but are not as severe.  December 24, 2013:  [name] has been hospitalized since I last saw him. He had some chest pain and a followup cardiac catheterization revealed that his stents were okay. [name] been on Ranexa but complains of the cost is a bit high. The Ranexa seems to be helping quite a bit.    Feb. 18, 2016:    [name] is a 82 y.o. male who presents for follow up of his CAD Fells great.  Has been feeling better on the Ranexa.  Wants to cut the Ranexa to every other day.   Enjoying life.  Not exercising much these days.  Gets his INR levels drawn at his primary medical doctors office.   Sept. 23, 2016: [name] is feeling great. No CP No dyspnea  BP has been well controlled   Had some bloody stools  - has resolved.   July 30, 2015 Overall doing the same Has occasional CP , takes SL NTG once a week .  Seems to help Does not occur with exertion  Has not been walking much recently  -   October 10, 2015:  Doing ok. Having some left sided chest pain .   Has taken some SL NTG. Also has had some unrelated shoulder pain  Takes the SL NTG almost daily .  Is on Imdur 90 mg a day  And Ranexa 1000  mg BID   Not necessarily due to exertion.      Past Medical History  Diagnosis Date  . Coronary artery disease     a. s/p PCI w/ DES to mLAD 08/05/10. b. NSTEMI 08/2013 (mildly elev trop) - cath showing widely patent stent, 80% prox D1 (small vessel) with  recommendation for medical management, residual 60% ostial PDA, <20% LCx, LVEF 55-65%.   . Hypertension   . Hyperlipidemia   . Barrett esophagus   . Gallstone pancreatitis 2011    a. s/p chole.  . SBO (small bowel obstruction) ([place]) 2005  . Macrocytic anemia 06/25/2011  . PAF (paroxysmal atrial fibrillation) ([place])     a. failed TEE due to inability to pass probe into the esophagus in March 2013. b. On Coumadin. Was in NSR 08/2013 admission.  . Chronic anticoagulation   . Depression   . GERD (gastroesophageal reflux disease)   . Colon cancer ([place])     a. 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 b.  "graduated" from their practice 06/2013, remains free of any new disease per those notes.  . Type II diabetes mellitus ([place])   . Premature atrial contractions   . Premature ventricular contractions   . Aortic stenosis     a. Mild-mod by echo 2013.  [name] Chronic systolic CHF (congestive heart failure) (HCC)     a. EF previously 35-40%. b. improved to 55-65% by cath 08/2013.  . Myelodysplastic disease ([place])     a. Suspected low-grade  myelodysplastic disease per onc notes.  . Other pancytopenia ([place]) 06/18/2014    Mild, fluctuating, likely med related. Rule out early MDS from prior chemo/RT or colon cancer    Past Surgical History  Procedure Laterality Date  . Cholecystectomy  2011  . Right colectomy    . Coronary angioplasty with stent placement  08/05/10    DES to the LAD  . Cardiac catheterization  08/03/10  . Cardiac catheterization  08/09/10    LAD 30, stent OK, CFX 40, RCA < 20  . Abdominal mass resection      Mass near mesentery  . Cystoscopy      "with removal of kidney stone in office"  . Cataract extraction Bilateral   . Appendectomy      "took out during colon surgery"  . Cystoscopy with retrograde pyelogram, ureteroscopy and stent placement Bilateral 05/18/2013    Procedure: CYSTOSCOPY WITH BILATERAL RETROGRADE PYELOGRAM, LEFT URETEROSCOPY AND LEFT STENT PLACEMENT;   Surgeon: Alexis Frock, MD;  Location: [facility];  Service: Urology;  Laterality: Bilateral;  . Colon surgery    . Tonsillectomy    . Left heart catheterization with coronary angiogram N/A 08/15/2013    Procedure: LEFT HEART CATHETERIZATION WITH CORONARY ANGIOGRAM;  Surgeon: Peter M Martinique, MD;  Location: [facility] CATH LAB;  Service: Cardiovascular;  Laterality: N/A;     Current Outpatient Prescriptions  Medication  Sig Dispense Refill  . atorvastatin (LIPITOR) 40 MG tablet TAKE ONE TABLET BY MOUTH ONCE DAILY 6PM 90 tablet 1  . carvedilol (COREG) 25 MG tablet TAKE ONE TABLET BY MOUTH TWICE DAILY WITH FOOD 60 tablet 11  . Cholecalciferol (VITAMIN D-3 PO) Take 1 tablet by mouth daily.     [name] donepezil (ARICEPT) 5 MG tablet Take 5 mg by mouth at bedtime.     . finasteride (PROSCAR) 5 MG tablet Take 5 mg by mouth at bedtime.     [name] glimepiride (AMARYL) 4 MG tablet Take 4 mg by mouth daily after breakfast.     . isosorbide mononitrate (IMDUR) 60 MG 24 hr tablet Take 1.5 tablets (90 mg total) by mouth daily. 135 tablet 3  . linagliptin (TRADJENTA) 5 MG TABS tablet Take 5 mg by mouth daily. For blood sugar    . lisinopril (PRINIVIL,ZESTRIL) 5 MG tablet Take 5 mg by mouth daily.    [name] loratadine (CLARITIN) 10 MG tablet Take 10 mg by mouth daily as needed for allergies.    . Melatonin (RA MELATONIN) 10 MG TABS Take 10 mg by mouth at bedtime.     . Multiple Vitamins-Minerals (PRESERVISION/LUTEIN) CAPS Take 1 capsule by mouth daily.   0  . naproxen (NAPROSYN) 250 MG tablet Take 1 tablet (250 mg total) by mouth 3 (three) times daily with meals as needed. 20 tablet 0  . nitroGLYCERIN (NITROSTAT) 0.4 MG SL tablet Place 1 tablet (0.4 mg total) under the tongue every 5 (five) minutes as needed for chest pain. 100 tablet 5  . omeprazole (PRILOSEC) 20 MG capsule Take 20 mg by mouth daily.    Vladimir Faster Glycol-Propyl Glycol (SYSTANE OP) Place 1 drop into both eyes as directed. 2-4 times a day    . ranolazine (RANEXA) 1000  MG SR tablet Take 1 tablet (1,000 mg total) by mouth 2 (two) times daily. 60 tablet 11  . warfarin (COUMADIN) 4 MG tablet Take 0.5-1 tablets (2-4 mg total) by mouth daily. Take 2 mg on Tues / Thurs ** Take 4 mg on Sun / Mon / Wed / Fri / Sat  START ON 09/18/14-Wednesday-MAKE SURE YOU GO TO YOUR PRIMARY DOCTOR'S OFFICE FOR CBC/INR CHECK WITHIN 1 WEEK (Patient taking differently: Take 4 mg by mouth daily. )     No current facility-administered medications for this visit.    Allergies:   Metformin and related    Social History:  The patient  reports that he quit smoking about 21 years ago. His smoking use included Cigarettes. He has a 30 pack-year smoking history. He has never used smokeless tobacco. He reports that he drinks about 1.2 oz of alcohol per week. He reports that he does not use illicit drugs.   Family History:  The patient's family history includes Cancer in his mother; Heart attack (age of onset: 50) in his brother; Ulcers in his father; Ulcers (age of onset: 24) in his father.    ROS:  Please see the history of present illness.    Review of Systems: Constitutional:  denies fever, chills, diaphoresis, appetite change and fatigue.  HEENT: denies photophobia, eye pain, redness, hearing loss, ear pain, congestion, sore throat, rhinorrhea, sneezing, neck pain, neck stiffness and tinnitus.  Respiratory: denies SOB, DOE, cough, chest tightness, and wheezing.  Cardiovascular: denies chest pain, palpitations and leg swelling.  Gastrointestinal: denies nausea, vomiting, abdominal pain, diarrhea, constipation, blood in stool.  Genitourinary: denies dysuria, urgency, frequency, hematuria, flank pain and difficulty urinating.  Musculoskeletal: denies  myalgias, back pain, joint swelling, arthralgias and gait problem.   Skin: denies pallor, rash and wound.  Neurological: denies dizziness, seizures, syncope, weakness, light-headedness, numbness and headaches.   Hematological: denies  adenopathy, easy bruising, personal or family bleeding history.  Psychiatric/ Behavioral: denies suicidal ideation, mood changes, confusion, nervousness, sleep disturbance and agitation.       All other systems are reviewed and negative.    PHYSICAL EXAM: VS:  BP 90/54 mmHg  Pulse 65  Ht 5\' 9"  (1.753 m)  Wt 155 lb 12.8 oz (70.67 kg)  BMI 23.00 kg/m2  SpO2 94% , BMI Body mass index is 23 kg/(m^2). GEN: Well nourished, well developed, in no acute distress HEENT: normal Neck: no JVD, carotid bruits, or masses Cardiac: RRR  Frequent premature beats. ; no murmurs, rubs, or gallops,no edema  Respiratory:  clear to auscultation bilaterally, normal work of breathing GI: soft, nontender, nondistended, + BS MS: no deformity or atrophy Skin: warm and dry, no rash Neuro:  Strength and sensation are intact Psych: normal   EKG:  EKG is not ordered today.   Recent Labs: 08/09/2015: BUN 13; Creatinine, Ser 1.00; Hemoglobin 12.8*; Platelets 119*; Potassium 4.6; Sodium 138 08/10/2015: B Natriuretic Peptide 223.9*    Lipid Panel    Component Value Date/Time   CHOL 119 08/07/2013 0946   TRIG 50.0 08/07/2013 0946   HDL 39.50 08/07/2013 0946   CHOLHDL 3 08/07/2013 0946   VLDL 10.0 08/07/2013 0946   LDLCALC 70 08/07/2013 0946      Wt Readings from Last 3 Encounters:  10/10/15 155 lb 12.8 oz (70.67 kg)  08/10/15 149 lb 9.6 oz (67.858 kg)  07/30/15 161 lb 6.4 oz (73.211 kg)      Other studies Reviewed: Additional studies/ records that were reviewed today include: . Review of the above records demonstrates:    ASSESSMENT AND PLAN:  1. CAD - s/p stenting July 2012.  - he continues to have angina.   Has has diffuse irregularities as well as several stents.  Will increase Imdur to 90 mg a day ( currently just taking 60 )   ranexa to 1000 mg BID  Refill SL NTG  His BP is too low to consider adding more Imdur . I Do not think that we would necessarily learn much more by doing  additional stress testing and would like to avoid invasive procedures in [name] at this time.  He is to continue taking nitroglycerin as needed. If the pain does not go away with nitroglycerin and instructed him to come to the hospital.  He has a hx of GI bleed.    I hesitate to add  plavix - he is currently on coumadin .   2. Hypertension - pressure is well-controlled. Continue same meds.   3. Hyperlipidemia - stable   4. Diabetes Mellitus   5. Lower GI bleed. He was admitted recently with a lower GI bleed. He is on Coumadin for paroxysmal atrial fibrillation. His INR levels have been monitored by his primary medical doctor. GI bleeding has resolved.    Current medicines are reviewed at length with the patient today.  The patient does not have concerns regarding medicines.  The following changes have been made:  no change   Disposition:   FU with me in 6 months     Signed, Mertie Moores, MD  10/10/2015 11:41 AM    [facility] [address], [place], [place]  [address] Phone: [phone])  [phone]; Fax: [phone]

## 2015-10-10 NOTE — Patient Instructions (Signed)
Medication Instructions:  Your physician recommends that you continue on your current medications as directed. Please refer to the Current Medication list given to you today.   Labwork: None Ordered   Testing/Procedures: None Ordered   Follow-Up: Your physician wants you to follow-up in: 6 months with Dr. Nahser.  You will receive a reminder letter in the mail two months in advance. If you don't receive a letter, please call our office to schedule the follow-up appointment.   If you need a refill on your cardiac medications before your next appointment, please call your pharmacy.   Thank you for choosing [facility]! Arial Galligan, RN [phone]    

## 2015-11-03 ENCOUNTER — Encounter (HOSPITAL_COMMUNITY): Payer: Self-pay

## 2015-11-03 ENCOUNTER — Observation Stay (HOSPITAL_COMMUNITY): Payer: Medicare Other

## 2015-11-03 ENCOUNTER — Observation Stay (HOSPITAL_COMMUNITY)
Admission: EM | Admit: 2015-11-03 | Discharge: 2015-11-04 | Disposition: A | Discharge status: Home / Self Care | Payer: Medicare Other | Attending: Family Medicine | Admitting: Family Medicine

## 2015-11-03 ENCOUNTER — Emergency Department (HOSPITAL_COMMUNITY): Payer: Medicare Other

## 2015-11-03 ENCOUNTER — Other Ambulatory Visit: Payer: Self-pay

## 2015-11-03 DIAGNOSIS — I5022 Chronic systolic (congestive) heart failure: Secondary | ICD-10-CM | POA: Insufficient documentation

## 2015-11-03 DIAGNOSIS — D539 Nutritional anemia, unspecified: Secondary | ICD-10-CM | POA: Diagnosis present

## 2015-11-03 DIAGNOSIS — I959 Hypotension, unspecified: Secondary | ICD-10-CM | POA: Diagnosis present

## 2015-11-03 DIAGNOSIS — D649 Anemia, unspecified: Secondary | ICD-10-CM | POA: Diagnosis present

## 2015-11-03 DIAGNOSIS — E785 Hyperlipidemia, unspecified: Secondary | ICD-10-CM | POA: Diagnosis not present

## 2015-11-03 DIAGNOSIS — R404 Transient alteration of awareness: Secondary | ICD-10-CM

## 2015-11-03 DIAGNOSIS — I2583 Coronary atherosclerosis due to lipid rich plaque: Secondary | ICD-10-CM

## 2015-11-03 DIAGNOSIS — R262 Difficulty in walking, not elsewhere classified: Secondary | ICD-10-CM | POA: Diagnosis not present

## 2015-11-03 DIAGNOSIS — Z7901 Long term (current) use of anticoagulants: Secondary | ICD-10-CM | POA: Diagnosis not present

## 2015-11-03 DIAGNOSIS — R791 Abnormal coagulation profile: Secondary | ICD-10-CM | POA: Diagnosis present

## 2015-11-03 DIAGNOSIS — I110 Hypertensive heart disease with heart failure: Secondary | ICD-10-CM | POA: Diagnosis not present

## 2015-11-03 DIAGNOSIS — Z85038 Personal history of other malignant neoplasm of large intestine: Secondary | ICD-10-CM | POA: Diagnosis not present

## 2015-11-03 DIAGNOSIS — I480 Paroxysmal atrial fibrillation: Secondary | ICD-10-CM | POA: Diagnosis not present

## 2015-11-03 DIAGNOSIS — E1165 Type 2 diabetes mellitus with hyperglycemia: Secondary | ICD-10-CM | POA: Insufficient documentation

## 2015-11-03 DIAGNOSIS — R531 Weakness: Secondary | ICD-10-CM

## 2015-11-03 DIAGNOSIS — Z87891 Personal history of nicotine dependence: Secondary | ICD-10-CM | POA: Insufficient documentation

## 2015-11-03 DIAGNOSIS — I5020 Unspecified systolic (congestive) heart failure: Secondary | ICD-10-CM | POA: Diagnosis present

## 2015-11-03 DIAGNOSIS — I2510 Atherosclerotic heart disease of native coronary artery without angina pectoris: Secondary | ICD-10-CM | POA: Diagnosis present

## 2015-11-03 DIAGNOSIS — E119 Type 2 diabetes mellitus without complications: Secondary | ICD-10-CM

## 2015-11-03 DIAGNOSIS — T45515A Adverse effect of anticoagulants, initial encounter: Secondary | ICD-10-CM | POA: Insufficient documentation

## 2015-11-03 DIAGNOSIS — R0789 Other chest pain: Secondary | ICD-10-CM | POA: Diagnosis present

## 2015-11-03 DIAGNOSIS — R0602 Shortness of breath: Secondary | ICD-10-CM | POA: Diagnosis present

## 2015-11-03 DIAGNOSIS — I429 Cardiomyopathy, unspecified: Secondary | ICD-10-CM | POA: Diagnosis not present

## 2015-11-03 DIAGNOSIS — R55 Syncope and collapse: Secondary | ICD-10-CM | POA: Diagnosis present

## 2015-11-03 DIAGNOSIS — R739 Hyperglycemia, unspecified: Secondary | ICD-10-CM | POA: Diagnosis present

## 2015-11-03 DIAGNOSIS — E1159 Type 2 diabetes mellitus with other circulatory complications: Secondary | ICD-10-CM

## 2015-11-03 LAB — CBC
HEMATOCRIT: 36.8 % — AB (ref 39.0–52.0)
Hemoglobin: 12.4 g/dL — ABNORMAL LOW (ref 13.0–17.0)
MCH: 35 pg — ABNORMAL HIGH (ref 26.0–34.0)
MCHC: 33.7 g/dL (ref 30.0–36.0)
MCV: 104 fL — AB (ref 78.0–100.0)
PLATELETS: 164 10*3/uL (ref 150–400)
RBC: 3.54 MIL/uL — AB (ref 4.22–5.81)
RDW: 12.9 % (ref 11.5–15.5)
WBC: 4.8 10*3/uL (ref 4.0–10.5)

## 2015-11-03 LAB — I-STAT CHEM 8, ED
BUN: 22 mg/dL — ABNORMAL HIGH (ref 6–20)
CALCIUM ION: 1.25 mmol/L (ref 1.13–1.30)
CREATININE: 1.2 mg/dL (ref 0.61–1.24)
Chloride: 103 mmol/L (ref 101–111)
GLUCOSE: 213 mg/dL — AB (ref 65–99)
HCT: 38 % — ABNORMAL LOW (ref 39.0–52.0)
HEMOGLOBIN: 12.9 g/dL — AB (ref 13.0–17.0)
Potassium: 4.7 mmol/L (ref 3.5–5.1)
Sodium: 135 mmol/L (ref 135–145)
TCO2: 24 mmol/L (ref 0–100)

## 2015-11-03 LAB — CBC WITH DIFFERENTIAL/PLATELET
BASOS PCT: 0 %
Basophils Absolute: 0 10*3/uL (ref 0.0–0.1)
EOS ABS: 0.1 10*3/uL (ref 0.0–0.7)
Eosinophils Relative: 2 %
HCT: 36.9 % — ABNORMAL LOW (ref 39.0–52.0)
Hemoglobin: 12.3 g/dL — ABNORMAL LOW (ref 13.0–17.0)
LYMPHS ABS: 0.7 10*3/uL (ref 0.7–4.0)
Lymphocytes Relative: 11 %
MCH: 34.3 pg — AB (ref 26.0–34.0)
MCHC: 33.3 g/dL (ref 30.0–36.0)
MCV: 102.8 fL — ABNORMAL HIGH (ref 78.0–100.0)
MONO ABS: 0.4 10*3/uL (ref 0.1–1.0)
MONOS PCT: 6 %
Neutro Abs: 5 10*3/uL (ref 1.7–7.7)
Neutrophils Relative %: 81 %
Platelets: 161 10*3/uL (ref 150–400)
RBC: 3.59 MIL/uL — ABNORMAL LOW (ref 4.22–5.81)
RDW: 12.9 % (ref 11.5–15.5)
WBC: 6.2 10*3/uL (ref 4.0–10.5)

## 2015-11-03 LAB — VITAMIN B12: Vitamin B-12: 373 pg/mL (ref 180–914)

## 2015-11-03 LAB — I-STAT TROPONIN, ED: TROPONIN I, POC: 0.01 ng/mL (ref 0.00–0.08)

## 2015-11-03 LAB — FOLATE: FOLATE: 30.1 ng/mL (ref >5.9)

## 2015-11-03 LAB — TSH: TSH: 1.727 u[IU]/mL (ref 0.350–4.500)

## 2015-11-03 LAB — LIPID PANEL
Cholesterol: 101 mg/dL (ref 0–200)
HDL: 37 mg/dL — AB (ref >40)
LDL CALC: 50 mg/dL (ref 0–99)
Total CHOL/HDL Ratio: 2.7 RATIO
Triglycerides: 68 mg/dL (ref <150)
VLDL: 14 mg/dL (ref 0–40)

## 2015-11-03 LAB — GLUCOSE, CAPILLARY
GLUCOSE-CAPILLARY: 139 mg/dL — AB (ref 65–99)
GLUCOSE-CAPILLARY: 123 mg/dL — AB (ref 65–99)

## 2015-11-03 LAB — CREATININE, SERUM
Creatinine, Ser: 1.19 mg/dL (ref 0.61–1.24)
GFR, EST NON AFRICAN AMERICAN: 52 mL/min — AB (ref >60)

## 2015-11-03 LAB — PROTIME-INR
INR: 3.68 — AB (ref 0.00–1.49)
PROTHROMBIN TIME: 35.7 s — AB (ref 11.6–15.2)

## 2015-11-03 LAB — CBG MONITORING, ED: GLUCOSE-CAPILLARY: 257 mg/dL — AB (ref 65–99)

## 2015-11-03 LAB — TROPONIN I: Troponin I: 0.04 ng/mL — ABNORMAL HIGH (ref <0.031)

## 2015-11-03 MED ORDER — RANOLAZINE ER 500 MG PO TB12
1000.0000 mg | ORAL_TABLET | Freq: Two times a day (BID) | ORAL | Status: DC
  Administered 2015-11-03 – 2015-11-04 (×2): 1000 mg via ORAL
  Filled 2015-11-03 (×2): qty 2

## 2015-11-03 MED ORDER — ATORVASTATIN CALCIUM 40 MG PO TABS: 40.0000 mg | ORAL_TABLET | Freq: Every day | ORAL | Status: DC

## 2015-11-03 MED ORDER — ENOXAPARIN SODIUM 40 MG/0.4ML ~~LOC~~ SOLN: 40.0000 mg | SUBCUTANEOUS | Status: DC

## 2015-11-03 MED ORDER — FINASTERIDE 5 MG PO TABS
5.0000 mg | ORAL_TABLET | Freq: Every day | ORAL | Status: DC
  Administered 2015-11-03: 5 mg via ORAL
  Filled 2015-11-03: qty 1

## 2015-11-03 MED ORDER — NITROGLYCERIN 0.4 MG SL SUBL: 0.4000 mg | SUBLINGUAL_TABLET | SUBLINGUAL | Status: DC | PRN

## 2015-11-03 MED ORDER — ACETAMINOPHEN 325 MG PO TABS: 650.0000 mg | ORAL_TABLET | Freq: Four times a day (QID) | ORAL | Status: DC | PRN

## 2015-11-03 MED ORDER — LISINOPRIL 5 MG PO TABS
5.0000 mg | ORAL_TABLET | Freq: Every day | ORAL | Status: DC
  Administered 2015-11-04: 5 mg via ORAL
  Filled 2015-11-03: qty 1

## 2015-11-03 MED ORDER — SODIUM CHLORIDE 0.9% FLUSH
3.0000 mL | Freq: Two times a day (BID) | INTRAVENOUS | Status: DC
  Administered 2015-11-03: 3 mL via INTRAVENOUS

## 2015-11-03 MED ORDER — POLYVINYL ALCOHOL 1.4 % OP SOLN
Freq: Four times a day (QID) | OPHTHALMIC | Status: DC | PRN
  Filled 2015-11-03: qty 15

## 2015-11-03 MED ORDER — CARVEDILOL 12.5 MG PO TABS
12.5000 mg | ORAL_TABLET | Freq: Two times a day (BID) | ORAL | Status: DC
  Administered 2015-11-03 – 2015-11-04 (×2): 12.5 mg via ORAL
  Filled 2015-11-03 (×2): qty 1

## 2015-11-03 MED ORDER — LINAGLIPTIN 5 MG PO TABS
5.0000 mg | ORAL_TABLET | Freq: Every day | ORAL | Status: DC
  Administered 2015-11-04: 5 mg via ORAL
  Filled 2015-11-03: qty 1

## 2015-11-03 MED ORDER — ACETAMINOPHEN 650 MG RE SUPP: 650.0000 mg | Freq: Four times a day (QID) | RECTAL | Status: DC | PRN

## 2015-11-03 MED ORDER — SENNOSIDES-DOCUSATE SODIUM 8.6-50 MG PO TABS: 1.0000 | ORAL_TABLET | Freq: Every evening | ORAL | Status: DC | PRN

## 2015-11-03 MED ORDER — ISOSORBIDE MONONITRATE ER 60 MG PO TB24
60.0000 mg | ORAL_TABLET | Freq: Every day | ORAL | Status: DC
  Administered 2015-11-04: 60 mg via ORAL
  Filled 2015-11-03: qty 1

## 2015-11-03 MED ORDER — DONEPEZIL HCL 5 MG PO TABS
5.0000 mg | ORAL_TABLET | Freq: Every day | ORAL | Status: DC
  Administered 2015-11-03: 5 mg via ORAL
  Filled 2015-11-03: qty 1

## 2015-11-03 MED ORDER — SODIUM CHLORIDE 0.9 % IV BOLUS (SEPSIS)
250.0000 mL | Freq: Once | INTRAVENOUS | Status: AC
  Administered 2015-11-03: 250 mL via INTRAVENOUS

## 2015-11-03 MED ORDER — INSULIN ASPART 100 UNIT/ML ~~LOC~~ SOLN: 0.0000 [IU] | Freq: Three times a day (TID) | SUBCUTANEOUS | Status: DC

## 2015-11-03 MED ORDER — ONDANSETRON HCL 4 MG/2ML IJ SOLN: 4.0000 mg | Freq: Four times a day (QID) | INTRAMUSCULAR | Status: DC | PRN

## 2015-11-03 MED ORDER — PANTOPRAZOLE SODIUM 40 MG PO TBEC
40.0000 mg | DELAYED_RELEASE_TABLET | Freq: Every day | ORAL | Status: DC
  Administered 2015-11-03 – 2015-11-04 (×2): 40 mg via ORAL
  Filled 2015-11-03 (×2): qty 1

## 2015-11-03 MED ORDER — ONDANSETRON HCL 4 MG PO TABS: 4.0000 mg | ORAL_TABLET | Freq: Four times a day (QID) | ORAL | Status: DC | PRN

## 2015-11-03 MED ORDER — MELATONIN 3 MG PO TABS
3.0000 mg | ORAL_TABLET | Freq: Every evening | ORAL | Status: DC | PRN
  Filled 2015-11-03: qty 1

## 2015-11-03 NOTE — Progress Notes (Addendum)
ANTICOAGULATION CONSULT NOTE - Initial Consult  Pharmacy Consult for Warfarin Indication: atrial fibrillation  Allergies  Allergen Reactions  . Metformin And Related Nausea And Vomiting    Patient Measurements: Height: 5\' 9"  (175.3 cm) Weight: 155 lb (70.308 kg) IBW/kg (Calculated) : 70.7 Heparin Dosing Weight: n/a  Vital Signs: Temp: 97.9 F (36.6 C) (06/26 1823) Temp Source: Oral (06/26 1823) BP: 158/68 mmHg (06/26 1823) Pulse Rate: 59 (06/26 1823)  Labs:  Recent Labs  11/03/15 1517 11/03/15 1541 11/03/15 1549  HGB 12.3* 12.9*  --   HCT 36.9* 38.0*  --   PLT 161  --   --   LABPROT  --   --  35.7*  INR  --   --  3.68*  CREATININE  --  1.20  --     Estimated Creatinine Clearance: 40.7 mL/min (by C-G formula based on Cr of 1.2).   Medical History: Past Medical History  Diagnosis Date  . Coronary artery disease     a. s/p PCI w/ DES to mLAD 08/05/10. b. NSTEMI 08/2013 (mildly elev trop) - cath showing widely patent stent, 80% prox D1 (small vessel) with recommendation for medical management, residual 60% ostial PDA, <20% LCx, LVEF 55-65%.   . Hypertension   . Hyperlipidemia   . Barrett esophagus   . Gallstone pancreatitis 2011    a. s/p chole.  . SBO (small bowel obstruction) ([place]) 2005  . Macrocytic anemia 06/25/2011  . PAF (paroxysmal atrial fibrillation) ([place])     a. failed TEE due to inability to pass probe into the esophagus in March 2013. b. On Coumadin. Was in NSR 08/2013 admission.  . Chronic anticoagulation   . Depression   . GERD (gastroesophageal reflux disease)   . Colon cancer ([place])     a. 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 b.  "graduated" from their practice 06/2013, remains free of any new disease per those notes.  . Type II diabetes mellitus ([place])   . Premature atrial contractions   . Premature ventricular contractions   . Aortic stenosis     a. Mild-mod by echo 2013.  [name] Chronic systolic CHF (congestive heart failure) ([facility])      a. EF previously 35-40%. b. improved to 55-65% by cath 08/2013.  . Myelodysplastic disease ([place])     a. Suspected low-grade  myelodysplastic disease per onc notes.  . Other pancytopenia ([place]) 06/18/2014    Mild, fluctuating, likely med related. Rule out early MDS from prior chemo/RT or colon cancer    Medications:  Scheduled:  . [START ON 11/04/2015] atorvastatin  40 mg Oral q1800  . [START ON 11/04/2015] carvedilol  12.5 mg Oral BID WC  . donepezil  5 mg Oral QHS  . enoxaparin (LOVENOX) injection  40 mg Subcutaneous Q24H  . finasteride  5 mg Oral QHS  . [START ON 11/04/2015] insulin aspart  0-9 Units Subcutaneous TID WC  . [START ON 11/04/2015] isosorbide mononitrate  60 mg Oral Daily  . linagliptin  5 mg Oral Daily  . [START ON 11/04/2015] lisinopril  5 mg Oral Daily  . pantoprazole  40 mg Oral Daily  . Polyethyl Glycol-Propyl Glycol   Both Eyes UD  . ranolazine  1,000 mg Oral BID  . sodium chloride  250 mL Intravenous Once  . sodium chloride flush  3 mL Intravenous Q12H    Assessment: 82 yo male on chronic anticoagulation with warfarin admitted for syncope.  INR elevated at admission.    PTA  Coumadin dose = 4 mg daily (confirmed with patient)  Goal of Therapy:  INR 2-3 Monitor platelets by anticoagulation protocol: Yes   Plan:  1. No Coumadin tonight. 2. F/u daily INR.  Uvaldo Rising, BCPS  Clinical Pharmacist Pager [phone]  11/03/2015 6:45 PM

## 2015-11-03 NOTE — H&P (Addendum)
History and Physical  [name] [id number] DOB: 1924/02/16 DOA: 11/03/2015  Referring physician: Winfred Leeds, MD  PCP: Irven Shelling, MD  Outpatient Specialists:  1. Cardiologist : Nahser  Chief Complaint: passed out  HPI: [name] is a 82 y.o. male with a complex heart history as detailed below presents to ED by EMS.   Patient complained of chest "swelling and tightness for a few minutes approximately 1:15 PM today prior to arrival and then slumped over in his chair suffering a near syncopal event. He was having coffee at [facility] with friends when it happened.  He was not eating at the time.  He apparently did NOT completely pass out.  He was less responsive for approximately 15 minutes per friends who accompaned him to the ED.  He has continued to feel generally weak since the event but no longer has chest symptoms.  He denies that he ever had specific chest pain.   No treatment reported by EMS prior to arrival to ED.   He denies shortness of breath, abdominal pain, focal numbness or extremity weakness. EMS reports that initial blood pressure was 80/40 on their arrival. No treatment prior to coming here. He rapidly seemed to recover from this event after EMS arrived per reports.  He denies blood per rectum or black stools. No other associated symptoms.  He says that he has had a similar episode like this before but he was able to sit down for about 30 minutes until it symptoms passed.  He didn't seek medical help at the time.    Of note, he had recently seen his cardiologist and his imdur was increased from 60 mg to 90 mg which the patient had been taking.  They had been trying to avoid further invasive testing in this particular patient and had been treating him medically for his CAD.   He was noted to be bradycardic on arrival with wide fluctuations in heart rate.  He also had been without his diabetes medications for at least a week and has been having elevated blood  sugars.  He is going to be admitted for further observation.    Review of Systems: All systems reviewed and apart from history of presenting illness, are negative.  Past Medical History  Diagnosis Date  . Coronary artery disease     a. s/p PCI w/ DES to mLAD 08/05/10. b. NSTEMI 08/2013 (mildly elev trop) - cath showing widely patent stent, 80% prox D1 (small vessel) with recommendation for medical management, residual 60% ostial PDA, <20% LCx, LVEF 55-65%.   . Hypertension   . Hyperlipidemia   . Barrett esophagus   . Gallstone pancreatitis 2011    a. s/p chole.  . SBO (small bowel obstruction) ([place]) 2005  . Macrocytic anemia 06/25/2011  . PAF (paroxysmal atrial fibrillation) ([place])     a. failed TEE due to inability to pass probe into the esophagus in March 2013. b. On Coumadin. Was in NSR 08/2013 admission.  . Chronic anticoagulation   . Depression   . GERD (gastroesophageal reflux disease)   . Colon cancer ([place])     a. 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 b.  "graduated" from their practice 06/2013, remains free of any new disease per those notes.  . Type II diabetes mellitus ([place])   . Premature atrial contractions   . Premature ventricular contractions   . Aortic stenosis     a. Mild-mod by echo 2013.  [name] Chronic systolic CHF (  congestive heart failure) (HCC)     a. EF previously 35-40%. b. improved to 55-65% by cath 08/2013.  . Myelodysplastic disease ([place])     a. Suspected low-grade  myelodysplastic disease per onc notes.  . Other pancytopenia ([place]) 06/18/2014    Mild, fluctuating, likely med related. Rule out early MDS from prior chemo/RT or colon cancer   Past Surgical History  Procedure Laterality Date  . Cholecystectomy  2011  . Right colectomy    . Coronary angioplasty with stent placement  08/05/10    DES to the LAD  . Cardiac catheterization  08/03/10  . Cardiac catheterization  08/09/10    LAD 30, stent OK, CFX 40, RCA < 20  . Abdominal mass resection       Mass near mesentery  . Cystoscopy      "with removal of kidney stone in office"  . Cataract extraction Bilateral   . Appendectomy      "took out during colon surgery"  . Cystoscopy with retrograde pyelogram, ureteroscopy and stent placement Bilateral 05/18/2013    Procedure: CYSTOSCOPY WITH BILATERAL RETROGRADE PYELOGRAM, LEFT URETEROSCOPY AND LEFT STENT PLACEMENT;  Surgeon: Alexis Frock, MD;  Location: [facility] ORS;  Service: Urology;  Laterality: Bilateral;  . Colon surgery    . Tonsillectomy    . Left heart catheterization with coronary angiogram N/A 08/15/2013    Procedure: LEFT HEART CATHETERIZATION WITH CORONARY ANGIOGRAM;  Surgeon: Peter M Martinique, MD;  Location: [facility] CATH LAB;  Service: Cardiovascular;  Laterality: N/A;   Social History:  reports that he quit smoking about 21 years ago. His smoking use included Cigarettes. He has a 30 pack-year smoking history. He has never used smokeless tobacco. He reports that he drinks about 1.2 oz of alcohol per week. He reports that he does not use illicit drugs.   Allergies  Allergen Reactions  . Metformin And Related Nausea And Vomiting    Family History  Problem Relation Age of Onset  . Cancer Mother   . Ulcers Father   . Heart attack Brother 23  . Ulcers Father 13    Prior to Admission medications   Medication Sig Start Date End Date Taking? Authorizing Provider  atorvastatin (LIPITOR) 40 MG tablet TAKE ONE TABLET BY MOUTH ONCE DAILY 6PM 08/15/15  Yes Thayer Headings, MD  carvedilol (COREG) 25 MG tablet TAKE ONE TABLET BY MOUTH TWICE DAILY WITH FOOD 07/04/15  Yes Thayer Headings, MD  Cholecalciferol (VITAMIN D-3 PO) Take 1 tablet by mouth daily.    Yes Historical Provider, MD  donepezil (ARICEPT) 5 MG tablet Take 5 mg by mouth at bedtime.  03/16/14  Yes Historical Provider, MD  finasteride (PROSCAR) 5 MG tablet Take 5 mg by mouth at bedtime.  04/01/14  Yes Historical Provider, MD  glimepiride (AMARYL) 4 MG tablet Take 4 mg by mouth daily after  breakfast.    Yes Historical Provider, MD  isosorbide mononitrate (IMDUR) 60 MG 24 hr tablet Take 1.5 tablets (90 mg total) by mouth daily. 07/30/15  Yes Thayer Headings, MD  linagliptin (TRADJENTA) 5 MG TABS tablet Take 5 mg by mouth daily. For blood sugar   Yes Historical Provider, MD  lisinopril (PRINIVIL,ZESTRIL) 5 MG tablet Take 5 mg by mouth daily. 01/27/15  Yes Historical Provider, MD  loratadine (CLARITIN) 10 MG tablet Take 10 mg by mouth daily as needed for allergies.   Yes Historical Provider, MD  Melatonin (RA MELATONIN) 10 MG TABS Take 10 mg by mouth daily as  needed. For sleep   Yes Historical Provider, MD  nitroGLYCERIN (NITROSTAT) 0.4 MG SL tablet Place 1 tablet (0.4 mg total) under the tongue every 5 (five) minutes as needed for chest pain. 07/30/15  Yes Thayer Headings, MD  omeprazole (PRILOSEC) 20 MG capsule Take 20 mg by mouth daily.   Yes Historical Provider, MD  Polyethyl Glycol-Propyl Glycol (SYSTANE OP) Place 1 drop into both eyes as directed. 2-4 times a day   Yes Historical Provider, MD  ranolazine (RANEXA) 1000 MG SR tablet Take 1 tablet (1,000 mg total) by mouth 2 (two) times daily. 07/30/15  Yes Thayer Headings, MD  warfarin (COUMADIN) 4 MG tablet Take 0.5-1 tablets (2-4 mg total) by mouth daily. Take 2 mg on Tues / Thurs ** Take 4 mg on Sun / Mon / Wed / Fri / Sat  START ON 09/18/14-Wednesday-MAKE SURE YOU GO TO YOUR PRIMARY DOCTOR'S OFFICE FOR CBC/INR CHECK WITHIN 1 WEEK Patient taking differently: Take 4 mg by mouth daily.  09/18/14  Yes Shanker Kristeen Mans, MD  naproxen (NAPROSYN) 250 MG tablet Take 1 tablet (250 mg total) by mouth 3 (three) times daily with meals as needed. Patient not taking: Reported on 11/03/2015 08/10/15   Thurnell Lose, MD   Physical Exam: Filed Vitals:   11/03/15 1545 11/03/15 1600 11/03/15 1615 11/03/15 1630  BP: 167/77 169/70 169/67 138/84  Pulse: 122 62 88 60  Temp:      TempSrc:      Resp: 14 20 23 14   Height:      Weight:      SpO2: 98%  99% 95% 97%     General exam: Moderately built and nourished patient, lying comfortably supine on the gurney in no obvious distress.  Head, eyes and ENT: Nontraumatic and normocephalic. Pupils equally reacting to light and accommodation. Oral mucosa dry.   Neck: Supple. No JVD, carotid bruit or thyromegaly.  Lymphatics: No lymphadenopathy.  Respiratory system: Clear to auscultation. No increased work of breathing.  Cardiovascular system: S1 and S2 heard, bradycardic.   Gastrointestinal system: Abdomen is nondistended, soft and nontender. Normal bowel sounds heard. No organomegaly or masses appreciated.  Central nervous system: Alert and oriented. No focal neurological deficits.  Extremities: No clubbing cyanosis or edema.   Skin: No rashes or acute findings.  Musculoskeletal system: Negative exam.  Psychiatry: Pleasant and cooperative.  Labs on Admission:  Basic Metabolic Panel:  Recent Labs Lab 11/03/15 1541  NA 135  K 4.7  CL 103  GLUCOSE 213*  BUN 22*  CREATININE 1.20   Liver Function Tests: No results for input(s): AST, ALT, ALKPHOS, BILITOT, PROT, ALBUMIN in the last 168 hours. No results for input(s): LIPASE, AMYLASE in the last 168 hours. No results for input(s): AMMONIA in the last 168 hours. CBC:  Recent Labs Lab 11/03/15 1517 11/03/15 1541  WBC 6.2  --   NEUTROABS 5.0  --   HGB 12.3* 12.9*  HCT 36.9* 38.0*  MCV 102.8*  --   PLT 161  --    Cardiac Enzymes: No results for input(s): CKTOTAL, CKMB, CKMBINDEX, TROPONINI in the last 168 hours.  BNP (last 3 results) No results for input(s): PROBNP in the last 8760 hours. CBG:  Recent Labs Lab 11/03/15 1405  GLUCAP 257*   Radiological Exams on Admission: Dg Chest Port 1 View  11/03/2015  CLINICAL DATA:  Chest pressure. EXAM: PORTABLE CHEST 1 VIEW COMPARISON:  08/09/2015 FINDINGS: Diffuse interstitial prominence throughout the lungs, stable. No acute  airspace opacities or effusions. Heart is  normal size. No acute bony abnormality. IMPRESSION: Stable chronic interstitial disease.  No acute findings. Electronically Signed   By: Rolm Baptise M.D.   On: 11/03/2015 15:53   Assessment/Plan Principal Problem:   Chest discomfort Active Problems:   Coronary artery disease   SOB (shortness of breath)   Chronic systolic CHF (congestive heart failure) (HCC)   Near syncope   Transient Hypotension   Generalized weakness   Supratherapeutic INR   Diabetes mellitus (HCC)   Macrocytic anemia   Cardiomyopathy (HCC)   Hyperglycemia, unspecified   Anemia, unspecified  1. Near Syncope - I suspect he passed out because he was hypotensive due to multiple factors.  He has been out of his DM meds for 1 week and hyperglycemic with likely some mild dehydration, also his imdur was increased recently from 60 mg to 90 mg.  He is on a higher dose of beta blocker and he was found to be bradycardic as well.  Will try reducing dose of carvedilol and imdur and follow blood pressure closely.  Follow telemetry.  He is being admitted for observation, check an echocardiogram, cycle troponin, monitor on telemetry.  Get PT/OT consult.  Fall precautions recommended.   2. Bradycardia - I asked cardiology to help with medication management, will try reducing dose of carvedilol and monitor.   3. Hypotension - likely this was multifactorial but has responded well to a little bit of fluid, reducing imdur back down to 60 mg, reducing carvedilol, consider reducing lisinopril as well.  Will see what cardiology recommends.   4. Chest Discomfort - Pt has history of CAD but says that he tried nitroglycerin this morning and it didn't help his symptoms.  He is being observed.  Cycle troponin. I was reading the notes from his cardiologist and it appears that they are trying to manage his CAD medically and trying not to do any more invasive procedures.  5. Generalized weakness - I asked for a PT/OT evaluation.  His hemoglobin is pretty  good at 12.6.   6. CAD - Management as noted above.  Resuming his other home cardiac medications.   7. Chronic systolic Heart Failure -appears to be compensated at this time, making some minor medication adjustments as noted and asking for cardiology expert opinion.  I think that he is mildly dehydrated as evidenced by clinical exam and elevated BUN.  I'm ordering a small bolus of IVF to be run slowly over 2 hours.  Monitor clinically.  If we get his diabetes under control I think that we can help prevent further dehydration as he is spilling glucose in the urine.    8. Diabetes Mellitus, type 2, non-insulin requiring with hyperglycemia - Pt has been without his trujenta for about 1 week and he has been hyperglycemic, ordered blood glucose testing and sliding scale insulin coverage for hospital management.   9. Macrocytic anemia - hemoglobin stable.  Checking [id number], folic acid.  TSH.  10. Paroxysmal Atrial Fibrillation - anticoagulated with warfarin, rate is managed with carvedilol.   11. supratherapeutic INR - I asked for pharmD assistance with managing warfarin in hospital.  Hold warfarin dose today for now.      DVT Prophylaxis: warfarin Code Status: Full Family Communication: Pt updated at bedside  Disposition Plan: Possible home tomorrow   Time spent: 2 mins  Irwin Brakeman, MD [facility] Pager [phone]  If 7PM-7AM, please contact night-coverage [email or web address] Password [facility] 11/03/2015, 4:57 PM

## 2015-11-03 NOTE — ED Notes (Signed)
Pt arrives EMS with c/o syncope while eating at [facility]. EMS states initial BP 80/40 with pt pale and weak.C/o continued weakness.

## 2015-11-03 NOTE — ED Provider Notes (Signed)
CSN: [id number]     Arrival date & time 11/03/15  1356 History   First MD Initiated Contact with Patient 11/03/15 1414     Chief Complaint  Patient presents with  . Loss of Consciousness     (Consider location/radiation/quality/duration/timing/severity/associated sxs/prior Treatment) HPI Patient complained of chest "swelling and tightness for a few minutes approximately 1:15 PM today minute prior to arrival and then slumped over suffering near syncopal event. He was less responsive for approximately 15 minutes per friends who accompany him to the ED. He is presently asymptomatic except feels generally weak. No treatment prior to coming here. Denies shortness of breath denies abdominal pain no focal numbness or weakness. EMS reports that pressure was 80/40 on their arrival. No treatment prior to coming here. Denies blood per rectum or black stools. No other associated symptoms Past Medical History  Diagnosis Date  . Coronary artery disease     a. s/p PCI w/ DES to mLAD 08/05/10. b. NSTEMI 08/2013 (mildly elev trop) - cath showing widely patent stent, 80% prox D1 (small vessel) with recommendation for medical management, residual 60% ostial PDA, <20% LCx, LVEF 55-65%.   . Hypertension   . Hyperlipidemia   . Barrett esophagus   . Gallstone pancreatitis 2011    a. s/p chole.  . SBO (small bowel obstruction) ([place]) 2005  . Macrocytic anemia 06/25/2011  . PAF (paroxysmal atrial fibrillation) ([place])     a. failed TEE due to inability to pass probe into the esophagus in March 2013. b. On Coumadin. Was in NSR 08/2013 admission.  . Chronic anticoagulation   . Depression   . GERD (gastroesophageal reflux disease)   . Colon cancer ([place])     a. 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 b.  "graduated" from their practice 06/2013, remains free of any new disease per those notes.  . Type II diabetes mellitus ([place])   . Premature atrial contractions   . Premature ventricular contractions   .  Aortic stenosis     a. Mild-mod by echo 2013.  [name] Chronic systolic CHF (congestive heart failure) (HCC)     a. EF previously 35-40%. b. improved to 55-65% by cath 08/2013.  . Myelodysplastic disease ([place])     a. Suspected low-grade  myelodysplastic disease per onc notes.  . Other pancytopenia ([place]) 06/18/2014    Mild, fluctuating, likely med related. Rule out early MDS from prior chemo/RT or colon cancer   Past Surgical History  Procedure Laterality Date  . Cholecystectomy  2011  . Right colectomy    . Coronary angioplasty with stent placement  08/05/10    DES to the LAD  . Cardiac catheterization  08/03/10  . Cardiac catheterization  08/09/10    LAD 30, stent OK, CFX 40, RCA < 20  . Abdominal mass resection      Mass near mesentery  . Cystoscopy      "with removal of kidney stone in office"  . Cataract extraction Bilateral   . Appendectomy      "took out during colon surgery"  . Cystoscopy with retrograde pyelogram, ureteroscopy and stent placement Bilateral 05/18/2013    Procedure: CYSTOSCOPY WITH BILATERAL RETROGRADE PYELOGRAM, LEFT URETEROSCOPY AND LEFT STENT PLACEMENT;  Surgeon: Alexis Frock, MD;  Location: [facility];  Service: Urology;  Laterality: Bilateral;  . Colon surgery    . Tonsillectomy    . Left heart catheterization with coronary angiogram N/A 08/15/2013    Procedure: LEFT HEART CATHETERIZATION WITH CORONARY ANGIOGRAM;  Surgeon:  Peter M Martinique, MD;  Location: [facility] CATH LAB;  Service: Cardiovascular;  Laterality: N/A;   Family History  Problem Relation Age of Onset  . Cancer Mother   . Ulcers Father   . Heart attack Brother 64  . Ulcers Father 20   Social History  Substance Use Topics  . Smoking status: Former Smoker -- 1.00 packs/day for 30 years    Types: Cigarettes    Quit date: 05/10/1994  . Smokeless tobacco: Never Used  . Alcohol Use: 1.2 oz/week    2 Shots of liquor per week     Comment: occasionally.    Review of Systems  HENT: Negative.   Respiratory:  Positive for chest tightness.   Cardiovascular: Negative.   Gastrointestinal: Negative.   Musculoskeletal: Negative.   Skin: Negative.   Neurological: Positive for weakness.  Psychiatric/Behavioral: Negative.   All other systems reviewed and are negative.     Allergies  Metformin and related  Home Medications   Prior to Admission medications   Medication Sig Start Date End Date Taking? Authorizing Provider  atorvastatin (LIPITOR) 40 MG tablet TAKE ONE TABLET BY MOUTH ONCE DAILY 6PM 08/15/15   Thayer Headings, MD  carvedilol (COREG) 25 MG tablet TAKE ONE TABLET BY MOUTH TWICE DAILY WITH FOOD 07/04/15   Thayer Headings, MD  Cholecalciferol (VITAMIN D-3 PO) Take 1 tablet by mouth daily.     Historical Provider, MD  donepezil (ARICEPT) 5 MG tablet Take 5 mg by mouth at bedtime.  03/16/14   Historical Provider, MD  finasteride (PROSCAR) 5 MG tablet Take 5 mg by mouth at bedtime.  04/01/14   Historical Provider, MD  glimepiride (AMARYL) 4 MG tablet Take 4 mg by mouth daily after breakfast.     Historical Provider, MD  isosorbide mononitrate (IMDUR) 60 MG 24 hr tablet Take 1.5 tablets (90 mg total) by mouth daily. 07/30/15   Thayer Headings, MD  linagliptin (TRADJENTA) 5 MG TABS tablet Take 5 mg by mouth daily. For blood sugar    Historical Provider, MD  lisinopril (PRINIVIL,ZESTRIL) 5 MG tablet Take 5 mg by mouth daily. 01/27/15   Historical Provider, MD  loratadine (CLARITIN) 10 MG tablet Take 10 mg by mouth daily as needed for allergies.    Historical Provider, MD  Melatonin (RA MELATONIN) 10 MG TABS Take 10 mg by mouth at bedtime.     Historical Provider, MD  Multiple Vitamins-Minerals (PRESERVISION/LUTEIN) CAPS Take 1 capsule by mouth daily.  08/13/10   Romeo Apple, MD  naproxen (NAPROSYN) 250 MG tablet Take 1 tablet (250 mg total) by mouth 3 (three) times daily with meals as needed. 08/10/15   Thurnell Lose, MD  nitroGLYCERIN (NITROSTAT) 0.4 MG SL tablet Place 1 tablet (0.4 mg total)  under the tongue every 5 (five) minutes as needed for chest pain. 07/30/15   Thayer Headings, MD  omeprazole (PRILOSEC) 20 MG capsule Take 20 mg by mouth daily.    Historical Provider, MD  Polyethyl Glycol-Propyl Glycol (SYSTANE OP) Place 1 drop into both eyes as directed. 2-4 times a day    Historical Provider, MD  ranolazine (RANEXA) 1000 MG SR tablet Take 1 tablet (1,000 mg total) by mouth 2 (two) times daily. 07/30/15   Thayer Headings, MD  warfarin (COUMADIN) 4 MG tablet Take 0.5-1 tablets (2-4 mg total) by mouth daily. Take 2 mg on Tues / Thurs ** Take 4 mg on Sun / Mon / Wed / Fri / Sat  START  ON 09/18/14-Wednesday-MAKE SURE YOU GO TO YOUR PRIMARY DOCTOR'S OFFICE FOR CBC/INR CHECK WITHIN 1 WEEK Patient taking differently: Take 4 mg by mouth daily.  09/18/14   Shanker Kristeen Mans, MD   BP 128/61 mmHg  Pulse 110  Temp(Src) 97.5 F (36.4 C) (Oral)  Resp 16  Ht 5\' 9"  (1.753 m)  Wt 155 lb (70.308 kg)  BMI 22.88 kg/m2  SpO2 96% Physical Exam  Constitutional: He appears well-developed and well-nourished.  HENT:  Head: Normocephalic and atraumatic.  Eyes: Conjunctivae are normal. Pupils are equal, round, and reactive to light.  Neck: Neck supple. No tracheal deviation present. No thyromegaly present.  Cardiovascular: Regular rhythm.   No murmur heard. Bradycardic  Pulmonary/Chest: Effort normal and breath sounds normal.  Abdominal: Soft. Bowel sounds are normal. He exhibits no distension. There is no tenderness.  Musculoskeletal: Normal range of motion. He exhibits no edema or tenderness.  Neurological: He is alert. Coordination normal.  Skin: Skin is warm and dry. No rash noted.  Psychiatric: He has a normal mood and affect.  Nursing note and vitals reviewed.   ED Course  Procedures (including critical care time) Labs Review Labs Reviewed  CBG MONITORING, ED - Abnormal; Notable for the following:    Glucose-Capillary 257 (*)    All other components within normal limits     Imaging Review No results found. I have personally reviewed and evaluated these images and lab results as part of my medical decision-making.   EKG Interpretation None     ED ECG REPORT   Date: 11/03/2015  Rate: 50  Rhythm: sinus bradycardia  QRS Axis: normal  Intervals: normal  ST/T Wave abnormalities: nonspecific T wave changes  Conduction Disutrbances:Rightward IVCD  Narrative Interpretation:   Old EKG Reviewed: changes noted Rate slower than tracing from 08/09/2015 I have personally reviewed the EKG tracing and agree with the computerized printout as noted. Results for orders placed or performed during the hospital encounter of 11/03/15  CBC with Differential/Platelet  Result Value Ref Range   WBC 6.2 4.0 - 10.5 K/uL   RBC 3.59 (L) 4.22 - 5.81 MIL/uL   Hemoglobin 12.3 (L) 13.0 - 17.0 g/dL   HCT 36.9 (L) 39.0 - 52.0 %   MCV 102.8 (H) 78.0 - 100.0 fL   MCH 34.3 (H) 26.0 - 34.0 pg   MCHC 33.3 30.0 - 36.0 g/dL   RDW 12.9 11.5 - 15.5 %   Platelets 161 150 - 400 K/uL   Neutrophils Relative % 81 %   Neutro Abs 5.0 1.7 - 7.7 K/uL   Lymphocytes Relative 11 %   Lymphs Abs 0.7 0.7 - 4.0 K/uL   Monocytes Relative 6 %   Monocytes Absolute 0.4 0.1 - 1.0 K/uL   Eosinophils Relative 2 %   Eosinophils Absolute 0.1 0.0 - 0.7 K/uL   Basophils Relative 0 %   Basophils Absolute 0.0 0.0 - 0.1 K/uL  Protime-INR  Result Value Ref Range   Prothrombin Time 35.7 (H) 11.6 - 15.2 seconds   INR 3.68 (H) 0.00 - 1.49  CBG monitoring, ED  Result Value Ref Range   Glucose-Capillary 257 (H) 65 - 99 mg/dL  I-stat chem 8, ed  Result Value Ref Range   Sodium 135 135 - 145 mmol/L   Potassium 4.7 3.5 - 5.1 mmol/L   Chloride 103 101 - 111 mmol/L   BUN 22 (H) 6 - 20 mg/dL   Creatinine, Ser 1.20 0.61 - 1.24 mg/dL   Glucose, Bld 213 (H)  65 - 99 mg/dL   Calcium, Ion 1.25 1.13 - 1.30 mmol/L   TCO2 24 0 - 100 mmol/L   Hemoglobin 12.9 (L) 13.0 - 17.0 g/dL   HCT 38.0 (L) 39.0 - 52.0 %  I-stat  troponin, ED  Result Value Ref Range   Troponin i, poc 0.01 0.00 - 0.08 ng/mL   Comment 3           Dg Chest Port 1 View  11/03/2015  CLINICAL DATA:  Chest pressure. EXAM: PORTABLE CHEST 1 VIEW COMPARISON:  08/09/2015 FINDINGS: Diffuse interstitial prominence throughout the lungs, stable. No acute airspace opacities or effusions. Heart is normal size. No acute bony abnormality. IMPRESSION: Stable chronic interstitial disease.  No acute findings. Electronically Signed   By: Rolm Baptise M.D.   On: 11/03/2015 15:53    MDM  Dr. Wynetta Emery consulted and will see patient in the emergency department. Plan 23 hour observation telemetry Diagnosis #1 near syncope #2 hyperglycemia #3 Coumadin toxicity Final diagnoses:  None        Orlie Dakin, MD 11/03/15 [phone]

## 2015-11-04 ENCOUNTER — Other Ambulatory Visit: Payer: Self-pay | Admitting: Cardiology

## 2015-11-04 ENCOUNTER — Observation Stay (HOSPITAL_COMMUNITY): Payer: Medicare Other

## 2015-11-04 ENCOUNTER — Observation Stay (HOSPITAL_BASED_OUTPATIENT_CLINIC_OR_DEPARTMENT_OTHER): Payer: Medicare Other

## 2015-11-04 DIAGNOSIS — R404 Transient alteration of awareness: Secondary | ICD-10-CM

## 2015-11-04 DIAGNOSIS — R55 Syncope and collapse: Secondary | ICD-10-CM | POA: Diagnosis not present

## 2015-11-04 DIAGNOSIS — R0789 Other chest pain: Secondary | ICD-10-CM | POA: Diagnosis not present

## 2015-11-04 DIAGNOSIS — I5022 Chronic systolic (congestive) heart failure: Secondary | ICD-10-CM

## 2015-11-04 DIAGNOSIS — I480 Paroxysmal atrial fibrillation: Secondary | ICD-10-CM

## 2015-11-04 DIAGNOSIS — D649 Anemia, unspecified: Secondary | ICD-10-CM | POA: Diagnosis not present

## 2015-11-04 DIAGNOSIS — E119 Type 2 diabetes mellitus without complications: Secondary | ICD-10-CM

## 2015-11-04 LAB — VAS US CAROTID
LCCADSYS: -48 cm/s
LCCAPDIAS: 15 cm/s
LEFT ECA DIAS: -3 cm/s
LEFT VERTEBRAL DIAS: -3 cm/s
LICAPDIAS: -22 cm/s
Left CCA dist dias: -9 cm/s
Left CCA prox sys: 81 cm/s
Left ICA dist dias: -15 cm/s
Left ICA dist sys: -56 cm/s
Left ICA prox sys: -80 cm/s
RCCADSYS: -84 cm/s
RCCAPDIAS: 12 cm/s
RCCAPSYS: 81 cm/s
RIGHT ECA DIAS: -4 cm/s
RIGHT VERTEBRAL DIAS: -17 cm/s

## 2015-11-04 LAB — BASIC METABOLIC PANEL
ANION GAP: 7 (ref 5–15)
BUN: 14 mg/dL (ref 6–20)
CHLORIDE: 104 mmol/L (ref 101–111)
CO2: 26 mmol/L (ref 22–32)
Calcium: 9.3 mg/dL (ref 8.9–10.3)
Creatinine, Ser: 1.1 mg/dL (ref 0.61–1.24)
GFR calc non Af Amer: 57 mL/min — ABNORMAL LOW (ref >60)
GLUCOSE: 135 mg/dL — AB (ref 65–99)
Potassium: 4.4 mmol/L (ref 3.5–5.1)
Sodium: 137 mmol/L (ref 135–145)

## 2015-11-04 LAB — URINALYSIS, ROUTINE W REFLEX MICROSCOPIC
Bilirubin Urine: NEGATIVE
Glucose, UA: 100 mg/dL — AB
HGB URINE DIPSTICK: NEGATIVE
KETONES UR: NEGATIVE mg/dL
LEUKOCYTES UA: NEGATIVE
NITRITE: NEGATIVE
Protein, ur: NEGATIVE mg/dL
SPECIFIC GRAVITY, URINE: 1.018 (ref 1.005–1.030)
pH: 5.5 (ref 5.0–8.0)

## 2015-11-04 LAB — CBC
HEMATOCRIT: 37.4 % — AB (ref 39.0–52.0)
HEMOGLOBIN: 12.3 g/dL — AB (ref 13.0–17.0)
MCH: 33.3 pg (ref 26.0–34.0)
MCHC: 32.9 g/dL (ref 30.0–36.0)
MCV: 101.4 fL — AB (ref 78.0–100.0)
Platelets: 155 10*3/uL (ref 150–400)
RBC: 3.69 MIL/uL — AB (ref 4.22–5.81)
RDW: 12.8 % (ref 11.5–15.5)
WBC: 5.5 10*3/uL (ref 4.0–10.5)

## 2015-11-04 LAB — PROTIME-INR
INR: 4.14 (ref 0.00–1.49)
Prothrombin Time: 39 seconds — ABNORMAL HIGH (ref 11.6–15.2)

## 2015-11-04 LAB — GLUCOSE, CAPILLARY
GLUCOSE-CAPILLARY: 77 mg/dL (ref 65–99)
GLUCOSE-CAPILLARY: 131 mg/dL — AB (ref 65–99)

## 2015-11-04 LAB — HEMOGLOBIN A1C
HEMOGLOBIN A1C: 6.5 % — AB (ref 4.8–5.6)
Mean Plasma Glucose: 140 mg/dL

## 2015-11-04 LAB — TROPONIN I
TROPONIN I: 0.05 ng/mL — AB (ref <0.03)
Troponin I: 0.05 ng/mL (ref <0.03)

## 2015-11-04 MED ORDER — ISOSORBIDE MONONITRATE ER 60 MG PO TB24: 60.0000 mg | ORAL_TABLET | Freq: Every day | ORAL | Status: DC

## 2015-11-04 MED ORDER — LINAGLIPTIN 5 MG PO TABS: 5.0000 mg | ORAL_TABLET | Freq: Every day | ORAL | Status: DC

## 2015-11-04 MED ORDER — CARVEDILOL 12.5 MG PO TABS: 12.5000 mg | ORAL_TABLET | Freq: Two times a day (BID) | ORAL | Status: DC

## 2015-11-04 NOTE — Progress Notes (Signed)
VASCULAR LAB PRELIMINARY  PRELIMINARY  PRELIMINARY  PRELIMINARY  Carotid duplex completed.    Preliminary report:  Bilateral:  1-39% ICA stenosis.  Vertebral artery flow is antegrade.     Joshua Soulier, RVS 11/04/2015, 2:34 PM

## 2015-11-04 NOTE — Discharge Summary (Signed)
Physician Discharge Summary  [name] [id number] DOB: March 01, 1924 DOA: 11/03/2015  PCP: Irven Shelling, MD  Admit date: 11/03/2015 Discharge date: 11/04/2015  Admitted From: Home Disposition:  Home  Recommendations for Outpatient Follow-up:  1. Follow up with PCP in 2 or 3 days to have PT/INR retested 2. Please see your cardiologist in next week for follow up. 3. Please do an outpatient work up for syncope with your doctors.   Discharge Condition: Stable CODE STATUS: Full  Diet recommendation: Heart Healthy / Carb Modified   Brief/Interim Summary: [name] is a 82 y.o. male with a complex heart history as detailed below presents to ED by EMS. Patient complained of chest "swelling and tightness for a few minutes approximately 1:15 PM today prior to arrival and then slumped over in his chair suffering a near syncopal event. He was having coffee at [facility] with friends when it happened. He was not eating at the time. He apparently did NOT completely pass out. He was less responsive for approximately 15 minutes per friends who accompaned him to the ED. He has continued to feel generally weak since the event but no longer has chest symptoms. He denies that he ever had specific chest pain. No treatment reported by EMS prior to arrival to ED. He denies shortness of breath, abdominal pain, focal numbness or extremity weakness. EMS reports that initial blood pressure was 80/40 on their arrival. No treatment prior to coming here. He rapidly seemed to recover from this event after EMS arrived per reports. He denies blood per rectum or black stools. No other associated symptoms. He says that he has had a similar episode like this before but he was able to sit down for about 30 minutes until it symptoms passed. He didn't seek medical help at the time.   Of note, he had recently seen his cardiologist and his imdur was increased from 60 mg to 90 mg which the patient had  been taking. They had been trying to avoid further invasive testing in this particular patient and had been treating him medically for his CAD. He was noted to be bradycardic on arrival with wide fluctuations in heart rate. He also had been without his diabetes medications for at least a week and has been having elevated blood sugars. He was admitted for further observation.   1. Near Syncope - I suspect he passed out because he was hypotensive due to multiple factors. He has been out of his DM meds for 1 week and hyperglycemic with likely some mild dehydration, also his imdur was increased recently from 60 mg to 90 mg. He is on a higher dose of beta blocker and he was found to be bradycardic as well. Will try reducing dose of carvedilol and imdur and follow blood pressure closely. Follow telemetry. He is being admitted for observation, check an echocardiogram, cycle troponin, monitor on telemetry. PT consult - no further recommendations.  2. Bradycardia - Will try reducing dose of carvedilol and close follow up with cardiologist Dr. [name] recommended.  3. Hypotension - likely this was multifactorial but has responded well to a little bit of fluid, reducing imdur back down to 60 mg, reducing carvedilol, consider reducing lisinopril as well. Pt decided against waiting and asked to be discharged. .. He will follow up with his personal cardiologist.  4. Chest Discomfort - Pt has history of CAD but says that he tried nitroglycerin and it didn't help his symptoms. His troponin have been flat and he's  not having any more symptoms.  Pt declines to stay in hospital to have further work up done but will do outpatient.  I discussed with cardiology team . I was reading the notes from his cardiologist and it appears that they are trying to manage his CAD medically and trying not to do any more invasive procedures.   5. Generalized weakness - PT had no further recommendations he did well. His hemoglobin  is pretty good at 12.6.  6. CAD - Management as noted above. Resuming his other home cardiac medications.  7. Chronic systolic Heart Failure -appears to be compensated at this time, making some minor medication adjustments as noted and asking for cardiology expert opinion. I think that he is mildly dehydrated as evidenced by clinical exam and elevated BUN. I'm ordering a small bolus of IVF to be run slowly over 2 hours. He tolerated it well.    8. Diabetes Mellitus, type 2, non-insulin requiring with hyperglycemia - Pt has been without his trujenta for about 1 week and he has been hyperglycemic, ordered blood glucose testing and sliding scale insulin coverage for hospital management. His blood sugars are doing well now.  I wrote new Rx for trujenta at discharge.   9. Macrocytic anemia - hemoglobin stable. Follow up outpatient with PCP.    10. Paroxysmal Atrial Fibrillation - anticoagulated with warfarin, rate is managed with carvedilol.  11. supratherapeutic INR -  Hold warfarin for next 2 days and have PT/INR tested with PCP.    Pt insisted on discharge. Says he will not stay to have work up done in hospital.  Will followup outpatient to have his testing done.  I spoke with cardiology team and they are arranging his work up through the office.  Risks, benefits and possible complications discussed with patient and he verbalized understanding and still insisted to be discharged.     Discharge Diagnoses:  Principal Problem:   Chest discomfort Active Problems:   Coronary artery disease   SOB (shortness of breath)   Chronic systolic CHF (congestive heart failure) (HCC)   Near syncope   Transient Hypotension   Generalized weakness   Supratherapeutic INR   Chronic anticoagulation   Altered awareness, transient   Macrocytic anemia   Paroxysmal atrial fibrillation (HCC)   Cardiomyopathy (HCC)   Non-insulin dependent type 2 diabetes mellitus (HCC)   Hyperglycemia, unspecified    Anemia, unspecified  Discharge Instructions  Discharge Instructions    Diet - low sodium heart healthy    Complete by:  As directed      Discharge instructions    Complete by:  As directed   Do not take your warfarin today or tomorrow, follow up to have your PT/INR tested in 2 or 3 days with Primary doctor or cardiologist.     Increase activity slowly    Complete by:  As directed             Medication List    STOP taking these medications        naproxen 250 MG tablet  Commonly known as:  NAPROSYN      TAKE these medications        atorvastatin 40 MG tablet  Commonly known as:  LIPITOR  TAKE ONE TABLET BY MOUTH ONCE DAILY 6PM     carvedilol 12.5 MG tablet  Commonly known as:  COREG  Take 1 tablet (12.5 mg total) by mouth 2 (two) times daily with a meal.     donepezil 5 MG  tablet  Commonly known as:  ARICEPT  Take 5 mg by mouth at bedtime.     finasteride 5 MG tablet  Commonly known as:  PROSCAR  Take 5 mg by mouth at bedtime.     glimepiride 4 MG tablet  Commonly known as:  AMARYL  Take 4 mg by mouth daily after breakfast.     isosorbide mononitrate 60 MG 24 hr tablet  Commonly known as:  IMDUR  Take 1 tablet (60 mg total) by mouth daily.     linagliptin 5 MG Tabs tablet  Commonly known as:  TRADJENTA  Take 1 tablet (5 mg total) by mouth daily. For blood sugar     lisinopril 5 MG tablet  Commonly known as:  PRINIVIL,ZESTRIL  Take 5 mg by mouth daily.     loratadine 10 MG tablet  Commonly known as:  CLARITIN  Take 10 mg by mouth daily as needed for allergies.     nitroGLYCERIN 0.4 MG SL tablet  Commonly known as:  NITROSTAT  Place 1 tablet (0.4 mg total) under the tongue every 5 (five) minutes as needed for chest pain.     omeprazole 20 MG capsule  Commonly known as:  PRILOSEC  Take 20 mg by mouth daily.     RA MELATONIN 10 MG Tabs  Generic drug:  Melatonin  Take 10 mg by mouth daily as needed. For sleep     ranolazine 1000 MG SR tablet   Commonly known as:  RANEXA  Take 1 tablet (1,000 mg total) by mouth 2 (two) times daily.     SYSTANE OP  Place 1 drop into both eyes as directed. 2-4 times a day     VITAMIN D-3 PO  Take 1 tablet by mouth daily.     warfarin 4 MG tablet  Commonly known as:  COUMADIN  Take 0.5-1 tablets (2-4 mg total) by mouth daily. Take 2 mg on Tues / Thurs ** Take 4 mg on Sun / Mon / Wed / Fri / Sat  START ON 09/18/14-Wednesday-MAKE SURE YOU GO TO YOUR PRIMARY DOCTOR'S OFFICE FOR CBC/INR CHECK WITHIN 1 WEEK           Follow-up Information    Follow up with Irven Shelling, MD. Schedule an appointment as soon as possible for a visit in 2 days.   Specialty:  Internal Medicine   Why:  Hospital Follow Up and check PT/INR   Contact information:   [address] [place]  [address] [phone]       Follow up with Mertie Moores, MD. Schedule an appointment as soon as possible for a visit in 1 week.   Specialty:  Cardiology   Why:  Hospital Follow Up   Contact information:   [address] [place] [address] [phone]      Allergies  Allergen Reactions  . Metformin And Related Nausea And Vomiting    Procedures/Studies: Ct Head Wo Contrast  11/03/2015  CLINICAL DATA:  Syncopal episode this afternoon. EXAM: CT HEAD WITHOUT CONTRAST TECHNIQUE: Contiguous axial images were obtained from the base of the skull through the vertex without intravenous contrast. COMPARISON:  03/15/2014 FINDINGS: Ventricles, cisterns and other CSF spaces are within normal as there is minimal age related atrophic change. Mild chronic ischemic microvascular disease. There is no mass, mass effect, shift of midline structures or acute hemorrhage. No evidence of acute infarction. Bones and soft tissues are within normal. IMPRESSION: No acute intracranial findings. Mild  chronic ischemic microvascular disease and age related atrophic change. Electronically Signed   By: Marin Olp M.D.    On: 11/03/2015 20:57   Dg Chest Port 1 View  11/03/2015  CLINICAL DATA:  Chest pressure. EXAM: PORTABLE CHEST 1 VIEW COMPARISON:  08/09/2015 FINDINGS: Diffuse interstitial prominence throughout the lungs, stable. No acute airspace opacities or effusions. Heart is normal size. No acute bony abnormality. IMPRESSION: Stable chronic interstitial disease.  No acute findings. Electronically Signed   By: Rolm Baptise M.D.   On: 11/03/2015 15:53     Subjective: Pt says he feels better and he wants to be discharged.  He is angry because someone gave him a Estate agent and it said that he didn't meet admission criteria.    Discharge Exam: Filed Vitals:   11/04/15 0446 11/04/15 0812  BP: 131/59 165/111  Pulse: 56 99  Temp: 97.5 F (36.4 C)   Resp: 20    Filed Vitals:   11/03/15 1829 11/03/15 1930 11/04/15 0446 11/04/15 0812  BP: 127/57 145/68 131/59 165/111  Pulse: 76 71 56 99  Temp:  98.1 F (36.7 C) 97.5 F (36.4 C)   TempSrc:  Oral Oral   Resp: 18 20 20    Height:      Weight:   157 lb (71.215 kg)   SpO2: 98% 96% 95%     General: Pt is alert, awake, not in acute distress Cardiovascular: RRR, S1/S2 +, no rubs, no gallops Respiratory: CTA bilaterally, no wheezing, no rhonchi Abdominal: Soft, NT, ND, bowel sounds + Extremities: no edema, no cyanosis    The results of significant diagnostics from this hospitalization (including imaging, microbiology, ancillary and laboratory) are listed below for reference.     Microbiology: No results found for this or any previous visit (from the past 240 hour(s)).   Labs: BNP (last 3 results)  Recent Labs  08/10/15 0158  BNP [id number]*   Basic Metabolic Panel:  Recent Labs Lab 11/03/15 1541 11/03/15 1908 11/04/15 0713  NA 135  --  137  K 4.7  --  4.4  CL 103  --  104  CO2  --   --  26  GLUCOSE 213*  --  135*  BUN 22*  --  14  CREATININE 1.20 1.19 1.10  CALCIUM  --   --  9.3   Liver Function Tests: No results for input(s):  AST, ALT, ALKPHOS, BILITOT, PROT, ALBUMIN in the last 168 hours. No results for input(s): LIPASE, AMYLASE in the last 168 hours. No results for input(s): AMMONIA in the last 168 hours. CBC:  Recent Labs Lab 11/03/15 1517 11/03/15 1541 11/03/15 1908 11/04/15 0713  WBC 6.2  --  4.8 5.5  NEUTROABS 5.0  --   --   --   HGB 12.3* 12.9* 12.4* 12.3*  HCT 36.9* 38.0* 36.8* 37.4*  MCV 102.8*  --  104.0* 101.4*  PLT 161  --  164 155   Cardiac Enzymes:  Recent Labs Lab 11/03/15 1908 11/04/15 0044 11/04/15 0713  TROPONINI 0.04* 0.05* 0.05*   BNP: Invalid input(s): POCBNP CBG:  Recent Labs Lab 11/03/15 1405 11/03/15 1844 11/03/15 2039 11/04/15 0554 11/04/15 1118  GLUCAP 257* 139* 123* 77 131*   D-Dimer No results for input(s): DDIMER in the last 72 hours. Hgb A1c  Recent Labs  11/03/15 1908  HGBA1C 6.5*   Lipid Profile  Recent Labs  11/03/15 1908  CHOL 101  HDL 37*  LDLCALC 50  TRIG 68  CHOLHDL 2.7  Thyroid function studies  Recent Labs  11/03/15 1908  TSH 1.727   Anemia work up  Recent Labs  11/03/15 1908  VITAMINB12 373  FOLATE 30.1   Urinalysis    Component Value Date/Time   COLORURINE YELLOW 11/04/2015 0059   APPEARANCEUR CLOUDY* 11/04/2015 0059   LABSPEC 1.018 11/04/2015 0059   PHURINE 5.5 11/04/2015 0059   GLUCOSEU 100* 11/04/2015 0059   HGBUR NEGATIVE 11/04/2015 0059   BILIRUBINUR NEGATIVE 11/04/2015 0059   KETONESUR NEGATIVE 11/04/2015 0059   PROTEINUR NEGATIVE 11/04/2015 0059   UROBILINOGEN 0.2 06/07/2014 1924   NITRITE NEGATIVE 11/04/2015 0059   LEUKOCYTESUR NEGATIVE 11/04/2015 0059   Sepsis Labs Invalid input(s): PROCALCITONIN,  WBC,  LACTICIDVEN Microbiology No results found for this or any previous visit (from the past 240 hour(s)).  Time coordinating discharge: 25 minutes  SIGNED:  Irwin Brakeman, MD  [facility] 11/04/2015, 12:59 PM Pager   If 7PM-7AM, please contact  night-coverage [email or web address] Password [email or web address]

## 2015-11-04 NOTE — Discharge Instructions (Signed)
Please do not take warfarin for next 2 days.  Follow up with primary doctor or heart doctor to have PT/INR tested in next 2 or 3 days.   Return if symptoms come back or new problems develop.      Near-Syncope Near-syncope (commonly known as near fainting) is sudden weakness, dizziness, or feeling like you might pass out. This can happen when getting up or while standing for a long time. It is caused by a sudden decrease in blood flow to the brain, which can occur for various reasons. Most of the reasons are not serious.  HOME CARE Watch your condition for any changes.  Have someone stay with you until you feel stable.  If you feel like you are going to pass out:  Lie down right away.  Prop your feet up if you can.  Breathe deeply and steadily.  Move only when the feeling has gone away. Most of the time, this feeling lasts only a few minutes. You may feel tired for several hours.  Drink enough fluids to keep your pee (urine) clear or pale yellow.  If you are taking blood pressure or heart medicine, stand up slowly.  Follow up with your doctor as told. GET HELP RIGHT AWAY IF:   You have a severe headache.  You have unusual pain in the chest, belly (abdomen), or back.  You have bleeding from the mouth or butt (rectum), or you have black or tarry poop (stool).  You feel your heart beat differently than normal, or you have a very fast pulse.  You pass out, or you twitch and shake when you pass out.  You pass out when sitting or lying down.  You feel confused.  You have trouble walking.  You are weak.  You have vision problems. MAKE SURE YOU:   Understand these instructions.  Will watch your condition.  Will get help right away if you are not doing well or get worse.   This information is not intended to replace advice given to you by your health care provider. Make sure you discuss any questions you have with your health care provider.   Document Released:  10/13/2007 Document Revised: 05/17/2014 Document Reviewed: 09/29/2012 Elsevier Interactive Patient Education 2016 [place] on my medicine - Coumadin   (Warfarin)  This medication education was reviewed with me or my healthcare representative as part of my discharge preparation.  The pharmacist that spoke with me during my hospital stay was:  Wayland Salinas, [facility] [address]  Why was Coumadin prescribed for you? Coumadin was prescribed for you because you have a blood clot or a medical condition that can cause an increased risk of forming blood clots. Blood clots can cause serious health problems by blocking the flow of blood to the heart, lung, or brain. Coumadin can prevent harmful blood clots from forming. As a reminder your indication for Coumadin is:   Stroke Prevention Because Of Atrial Fibrillation  What test will check on my response to Coumadin? While on Coumadin (warfarin) you will need to have an INR test regularly to ensure that your dose is keeping you in the desired range. The INR (international normalized ratio) number is calculated from the result of the laboratory test called prothrombin time (PT).  If an INR APPOINTMENT HAS NOT ALREADY BEEN MADE FOR YOU please schedule an appointment to have this lab work done by your health care provider within 7 days. Your INR goal is usually a number between:  2 to 3 or your provider may give you a more narrow range like 2-2.5.  Ask your health care provider during an office visit what your goal INR is.  What  do you need to  know  About  COUMADIN? Take Coumadin (warfarin) exactly as prescribed by your healthcare provider about the same time each day.  DO NOT stop taking without talking to the doctor who prescribed the medication.  Stopping without other blood clot prevention medication to take the place of Coumadin may increase your risk of developing a new clot or stroke.  Get refills before you run out.  What do you  do if you miss a dose? If you miss a dose, take it as soon as you remember on the same day then continue your regularly scheduled regimen the next day.  Do not take two doses of Coumadin at the same time.  Important Safety Information A possible side effect of Coumadin (Warfarin) is an increased risk of bleeding. You should call your healthcare provider right away if you experience any of the following: ? Bleeding from an injury or your nose that does not stop. ? Unusual colored urine (red or dark brown) or unusual colored stools (red or black). ? Unusual bruising for unknown reasons. ? A serious fall or if you hit your head (even if there is no bleeding).  Some foods or medicines interact with Coumadin (warfarin) and might alter your response to warfarin. To help avoid this: ? Eat a balanced diet, maintaining a consistent amount of Vitamin K. ? Notify your provider about major diet changes you plan to make. ? Avoid alcohol or limit your intake to 1 drink for women and 2 drinks for men per day. (1 drink is 5 oz. wine, 12 oz. beer, or 1.5 oz. liquor.)  Make sure that ANY health care provider who prescribes medication for you knows that you are taking Coumadin (warfarin).  Also make sure the healthcare provider who is monitoring your Coumadin knows when you have started a new medication including herbals and non-prescription products.  Coumadin (Warfarin)  Major Drug Interactions  Increased Warfarin Effect Decreased Warfarin Effect  Alcohol (large quantities) Antibiotics (esp. Septra/Bactrim, Flagyl, Cipro) Amiodarone (Cordarone) Aspirin (ASA) Cimetidine (Tagamet) Megestrol (Megace) NSAIDs (ibuprofen, naproxen, etc.) Piroxicam (Feldene) Propafenone (Rythmol SR) Propranolol (Inderal) Isoniazid (INH) Posaconazole (Noxafil) Barbiturates (Phenobarbital) Carbamazepine (Tegretol) Chlordiazepoxide (Librium) Cholestyramine (Questran) Griseofulvin Oral Contraceptives Rifampin Sucralfate  (Carafate) Vitamin K   Coumadin (Warfarin) Major Herbal Interactions  Increased Warfarin Effect Decreased Warfarin Effect  Garlic Ginseng Ginkgo biloba Coenzyme Q10 Green tea St. Johns wort    Coumadin (Warfarin) FOOD Interactions  Eat a consistent number of servings per week of foods HIGH in Vitamin K (1 serving =  cup)  Collards (cooked, or boiled & drained) Kale (cooked, or boiled & drained) Mustard greens (cooked, or boiled & drained) Parsley *serving size only =  cup Spinach (cooked, or boiled & drained) Swiss chard (cooked, or boiled & drained) Turnip greens (cooked, or boiled & drained)  Eat a consistent number of servings per week of foods MEDIUM-HIGH in Vitamin K (1 serving = 1 cup)  Asparagus (cooked, or boiled & drained) Broccoli (cooked, boiled & drained, or raw & chopped) Brussel sprouts (cooked, or boiled & drained) *serving size only =  cup Lettuce, raw (green leaf, endive, romaine) Spinach, raw Turnip greens, raw & chopped   These websites have more information on Coumadin (warfarin):  FailFactory.se; VeganReport.com.au;

## 2015-11-04 NOTE — Progress Notes (Signed)
ANTICOAGULATION CONSULT NOTE - f/u Consult  Pharmacy Consult for Warfarin Indication: atrial fibrillation  Allergies  Allergen Reactions  . Metformin And Related Nausea And Vomiting    Patient Measurements: Height: 5\' 9"  (175.3 cm) Weight: 157 lb (71.215 kg) IBW/kg (Calculated) : 70.7 Heparin Dosing Weight: n/a  Vital Signs: Temp: 97.5 F (36.4 C) (06/27 0446) Temp Source: Oral (06/27 0446) BP: 165/111 mmHg (06/27 0812) Pulse Rate: 99 (06/27 0812)  Labs:  Recent Labs  11/03/15 1517 11/03/15 1541 11/03/15 1549 11/03/15 1908 11/04/15 0044 11/04/15 0713  HGB 12.3* 12.9*  --  12.4*  --  12.3*  HCT 36.9* 38.0*  --  36.8*  --  37.4*  PLT 161  --   --  164  --  155  LABPROT  --   --  35.7*  --   --  39.0*  INR  --   --  3.68*  --   --  4.14*  CREATININE  --  1.20  --  1.19  --  1.10  TROPONINI  --   --   --  0.04* 0.05* 0.05*    Estimated Creatinine Clearance: 44.6 mL/min (by C-G formula based on Cr of 1.1).   Medical History: Past Medical History  Diagnosis Date  . Coronary artery disease     a. s/p PCI w/ DES to mLAD 08/05/10. b. NSTEMI 08/2013 (mildly elev trop) - cath showing widely patent stent, 80% prox D1 (small vessel) with recommendation for medical management, residual 60% ostial PDA, <20% LCx, LVEF 55-65%.   . Hypertension   . Hyperlipidemia   . Barrett esophagus   . Gallstone pancreatitis 2011    a. s/p chole.  . SBO (small bowel obstruction) ([place]) 2005  . Macrocytic anemia 06/25/2011  . PAF (paroxysmal atrial fibrillation) ([place])     a. failed TEE due to inability to pass probe into the esophagus in March 2013. b. On Coumadin. Was in NSR 08/2013 admission.  . Chronic anticoagulation   . Depression   . GERD (gastroesophageal reflux disease)   . Colon cancer ([place])     a. 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 b.  "graduated" from their practice 06/2013, remains free of any new disease per those notes.  . Type II diabetes mellitus ([place])   .  Premature atrial contractions   . Premature ventricular contractions   . Aortic stenosis     a. Mild-mod by echo 2013.  [name] Chronic systolic CHF (congestive heart failure) (HCC)     a. EF previously 35-40%. b. improved to 55-65% by cath 08/2013.  . Myelodysplastic disease ([place])     a. Suspected low-grade  myelodysplastic disease per onc notes.  . Other pancytopenia ([place]) 06/18/2014    Mild, fluctuating, likely med related. Rule out early MDS from prior chemo/RT or colon cancer    Medications:  Scheduled:  . atorvastatin  40 mg Oral q1800  . carvedilol  12.5 mg Oral BID WC  . donepezil  5 mg Oral QHS  . finasteride  5 mg Oral QHS  . insulin aspart  0-9 Units Subcutaneous TID WC  . isosorbide mononitrate  60 mg Oral Daily  . linagliptin  5 mg Oral Q breakfast  . lisinopril  5 mg Oral Daily  . pantoprazole  40 mg Oral Daily  . ranolazine  1,000 mg Oral BID  . sodium chloride flush  3 mL Intravenous Q12H    Assessment: 82 yo male on chronic anticoagulation with warfarin admitted  for syncope.  INR elevated at admission (3.68).    PTA Coumadin dose = 4 mg daily  Anticoagulation: CBC stable. INR up today 4.14.   Goal of Therapy:  INR 2-3 Monitor platelets by anticoagulation protocol: Yes   Plan:  Continue to hold Coumadin Daily INR  Romey Cohea S. Alford Highland, PharmD, [facility] Clinical Staff Pharmacist Pager [phone]   11/04/2015 9:43 AM

## 2015-11-04 NOTE — Progress Notes (Signed)
Initial Nutrition Assessment  DOCUMENTATION CODES:   Not applicable  INTERVENTION:    Continue Carbohydrate Modified diet >> patient declined supplements  NUTRITION DIAGNOSIS:   Increased nutrient needs related to acute illness as evidenced by estimated needs  GOAL:   Patient will meet greater than or equal to 90% of their needs  MONITOR:   PO intake, Supplement acceptance, Labs, Weight trends, I & O's  REASON FOR ASSESSMENT:   Consult Assessment of nutrition requirement/status  ASSESSMENT:   82 y.o. Male with a complex heart history as detailed below presents to ED by EMS. Patient complained of chest "swelling and tightness for a few minutes approximately 1:15 PM today prior to arrival and then slumped over in his chair suffering a near syncopal event.   Patient reports a good appetite. PO intake 75-100% per flowsheet records. Declined addition or oral nutrition supplements. No muscle or subcutaneous fat depletion noticed.  Diet Order:  Diet Carb Modified Fluid consistency:: Thin; Room service appropriate?: Yes  Skin:  Reviewed, no issues  Last BM:  6/26  Height:   Ht Readings from Last 1 Encounters:  11/03/15 5\' 9"  (1.753 m)    Weight:   Wt Readings from Last 1 Encounters:  11/04/15 157 lb (71.215 kg)    Ideal Body Weight:  73 kg  BMI:  Body mass index is 23.17 kg/(m^2).  Estimated Nutritional Needs:   Kcal:  1700-1900  Protein:  80-90 gm  Fluid:  1.7-1.9 L  EDUCATION NEEDS:   No education needs identified at this time  Arthur Holms, RD, LDN Pager #: [phone] After-Hours Pager #: [phone]

## 2015-11-04 NOTE — Progress Notes (Signed)
[name] to be D/C'd Home per MD order. Discussed with the patient and all questions fully answered.    VVS, Skin clean, dry and intact without evidence of skin break down, no evidence of skin tears noted.  IV catheter discontinued intact. Site without signs and symptoms of complications. Dressing and pressure applied.  An After Visit Summary was printed and given to the patient.  Patient escorted ambulatory, and D/C home via taxi.  Cyndra Numbers  11/04/2015 4:16 PM

## 2015-11-04 NOTE — Consult Note (Signed)
CARDIOLOGY CONSULT NOTE   Patient ID: [name] MRN: [id number] DOB/AGE: [birth year] 82 y.o.  Admit date: 11/03/2015  Primary Physician   Irven Shelling, MD Primary Cardiologist: Dr. [name] Requesting MD: Dr. Wynetta Emery Reason for Consultation: Chest pain   HPI: [name] is a 82 year old male with a past medical history of CAD, HTN, HLD, mild AS, DM and PAF (On Coumadin).   He was at [facility] yesterday having his usual coffee with his friends when he developed acute onset chest pain that he describes as tightness in the left to center of his chest. No radiation. But he did feel SOB and diaphoretic. He slummed over in his chair and felt as if he could pass out. He describes the pain as very intense, more intense than any pain he has ever had before. This same pain with SOB and diaphoresis occurred 1.5 weeks ago. He was again at [facility] during that episode, having coffee.   When EMS arrived, he was hypotensive with BP of 80/40, his hypotension resolved upon arrival to the ED.   He is followed outpatient by Dr. [name] who saw him recently earlier this month. His isosorbide was increased to 90mg  daily 3 weeks ago as well as his Ranexa was increased to 1000mg  BID. Review of clinic notes shows that he has frequent chest pain requiring SL Nitro use. His last cath was in April 2015 that showed widely patent stent in mid LAD (placed in 2012), 60% stenosis is ostium of the PDA.   His last Echo was in 2013, his EF at that time was 35-40%, also had mild to moderate AS.   Currently, he is very adamant about going home as he was informed that he does not meet inpatient status. He does not wish to stay longer in the hospital than he has to. Primary team is planning to discharge him today. He was rehydrated and responded well with increase in BP's.   There was some concern of bradycardia upon arrival with HR of 50. I have reviewed telemetry and HR has remained above 65.    Past  Medical History  Diagnosis Date  . Coronary artery disease     a. s/p PCI w/ DES to mLAD 08/05/10. b. NSTEMI 08/2013 (mildly elev trop) - cath showing widely patent stent, 80% prox D1 (small vessel) with recommendation for medical management, residual 60% ostial PDA, <20% LCx, LVEF 55-65%.   . Hypertension   . Hyperlipidemia   . Barrett esophagus   . Gallstone pancreatitis 2011    a. s/p chole.  . SBO (small bowel obstruction) ([place]) 2005  . Macrocytic anemia 06/25/2011  . PAF (paroxysmal atrial fibrillation) ([place])     a. failed TEE due to inability to pass probe into the esophagus in March 2013. b. On Coumadin. Was in NSR 08/2013 admission.  . Chronic anticoagulation   . Depression   . GERD (gastroesophageal reflux disease)   . Colon cancer ([place])     a. 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 b.  "graduated" from their practice 06/2013, remains free of any new disease per those notes.  . Type II diabetes mellitus ([place])   . Premature atrial contractions   . Premature ventricular contractions   . Aortic stenosis     a. Mild-mod by echo 2013.  [name] Chronic systolic CHF (congestive heart failure) (H[facility])     a. EF previously 35-40%. b. improved to 55-65% by cath 08/2013.  . Myelodysplastic disease (  [place])     a. Suspected low-grade  myelodysplastic disease per onc notes.  . Other pancytopenia ([place]) 06/18/2014    Mild, fluctuating, likely med related. Rule out early MDS from prior chemo/RT or colon cancer     Past Surgical History  Procedure Laterality Date  . Cholecystectomy  2011  . Right colectomy    . Coronary angioplasty with stent placement  08/05/10    DES to the LAD  . Cardiac catheterization  08/03/10  . Cardiac catheterization  08/09/10    LAD 30, stent OK, CFX 40, RCA < 20  . Abdominal mass resection      Mass near mesentery  . Cystoscopy      "with removal of kidney stone in office"  . Cataract extraction Bilateral   . Appendectomy      "took out during colon surgery"    . Cystoscopy with retrograde pyelogram, ureteroscopy and stent placement Bilateral 05/18/2013    Procedure: CYSTOSCOPY WITH BILATERAL RETROGRADE PYELOGRAM, LEFT URETEROSCOPY AND LEFT STENT PLACEMENT;  Surgeon: Alexis Frock, MD;  Location: [facility] ORS;  Service: Urology;  Laterality: Bilateral;  . Colon surgery    . Tonsillectomy    . Left heart catheterization with coronary angiogram N/A 08/15/2013    Procedure: LEFT HEART CATHETERIZATION WITH CORONARY ANGIOGRAM;  Surgeon: Rumaisa Schnetzer M Martinique, MD;  Location: [facility];  Service: Cardiovascular;  Laterality: N/A;    Allergies  Allergen Reactions  . Metformin And Related Nausea And Vomiting    I have reviewed the patient's current medications . atorvastatin  40 mg Oral q1800  . carvedilol  12.5 mg Oral BID WC  . donepezil  5 mg Oral QHS  . finasteride  5 mg Oral QHS  . insulin aspart  0-9 Units Subcutaneous TID WC  . isosorbide mononitrate  60 mg Oral Daily  . linagliptin  5 mg Oral Q breakfast  . lisinopril  5 mg Oral Daily  . pantoprazole  40 mg Oral Daily  . ranolazine  1,000 mg Oral BID  . sodium chloride flush  3 mL Intravenous Q12H     acetaminophen **OR** acetaminophen, Melatonin, nitroGLYCERIN, ondansetron **OR** ondansetron (ZOFRAN) IV, polyvinyl alcohol, senna-docusate  Prior to Admission medications   Medication Sig Start Date End Date Taking? Authorizing Provider  atorvastatin (LIPITOR) 40 MG tablet TAKE ONE TABLET BY MOUTH ONCE DAILY 6PM 08/15/15  Yes Thayer Headings, MD  Cholecalciferol (VITAMIN D-3 PO) Take 1 tablet by mouth daily.    Yes Historical Provider, MD  donepezil (ARICEPT) 5 MG tablet Take 5 mg by mouth at bedtime.  03/16/14  Yes Historical Provider, MD  finasteride (PROSCAR) 5 MG tablet Take 5 mg by mouth at bedtime.  04/01/14  Yes Historical Provider, MD  glimepiride (AMARYL) 4 MG tablet Take 4 mg by mouth daily after breakfast.    Yes Historical Provider, MD  lisinopril (PRINIVIL,ZESTRIL) 5 MG tablet Take 5 mg by  mouth daily. 01/27/15  Yes Historical Provider, MD  loratadine (CLARITIN) 10 MG tablet Take 10 mg by mouth daily as needed for allergies.   Yes Historical Provider, MD  Melatonin (RA MELATONIN) 10 MG TABS Take 10 mg by mouth daily as needed. For sleep   Yes Historical Provider, MD  nitroGLYCERIN (NITROSTAT) 0.4 MG SL tablet Place 1 tablet (0.4 mg total) under the tongue every 5 (five) minutes as needed for chest pain. 07/30/15  Yes Thayer Headings, MD  omeprazole (PRILOSEC) 20 MG capsule Take 20 mg by mouth daily.  Yes Historical Provider, MD  Polyethyl Glycol-Propyl Glycol (SYSTANE OP) Place 1 drop into both eyes as directed. 2-4 times a day   Yes Historical Provider, MD  ranolazine (RANEXA) 1000 MG SR tablet Take 1 tablet (1,000 mg total) by mouth 2 (two) times daily. 07/30/15  Yes Thayer Headings, MD  warfarin (COUMADIN) 4 MG tablet Take 0.5-1 tablets (2-4 mg total) by mouth daily. Take 2 mg on Tues / Thurs ** Take 4 mg on Sun / Mon / Wed / Fri / Sat  START ON 09/18/14-Wednesday-MAKE SURE YOU GO TO YOUR PRIMARY DOCTOR'S OFFICE FOR CBC/INR CHECK WITHIN 1 WEEK Patient taking differently: Take 4 mg by mouth daily.  09/18/14  Yes Shanker Kristeen Mans, MD  carvedilol (COREG) 12.5 MG tablet Take 1 tablet (12.5 mg total) by mouth 2 (two) times daily with a meal. 11/04/15   Clanford Marisa Hua, MD  isosorbide mononitrate (IMDUR) 60 MG 24 hr tablet Take 1 tablet (60 mg total) by mouth daily. 11/04/15   Clanford Marisa Hua, MD  linagliptin (TRADJENTA) 5 MG TABS tablet Take 1 tablet (5 mg total) by mouth daily. For blood sugar 11/04/15   Clanford Marisa Hua, MD  naproxen (NAPROSYN) 250 MG tablet Take 1 tablet (250 mg total) by mouth 3 (three) times daily with meals as needed. Patient not taking: Reported on 11/03/2015 08/10/15   Thurnell Lose, MD     Social History   Social History  . Marital Status: Single    Spouse Name: N/A  . Number of Children: 0  . Years of Education: N/A   Occupational History  . Not on  file.   Social History Main Topics  . Smoking status: Former Smoker -- 1.00 packs/day for 30 years    Types: Cigarettes    Quit date: 05/10/1994  . Smokeless tobacco: Never Used  . Alcohol Use: 1.2 oz/week    2 Shots of liquor per week     Comment: occasionally.  . Drug Use: No  . Sexual Activity: No   Other Topics Concern  . Not on file   Social History Narrative   Lives alone.      Family Status  Relation Status Death Age  . Mother Deceased   . Father Deceased   . Paternal Grandfather Deceased   . Paternal Grandmother Deceased   . Maternal Grandfather Deceased   . Maternal Grandmother Deceased    Family History  Problem Relation Age of Onset  . Cancer Mother   . Ulcers Father   . Heart attack Brother 74  . Ulcers Father 22     ROS:  Full 14 point review of systems complete and found to be negative unless listed above.  Physical Exam: Blood pressure 165/111, pulse 99, temperature 97.5 F (36.4 C), temperature source Oral, resp. rate 20, height 5\' 9"  (1.753 m), weight 157 lb (71.215 kg), SpO2 95 %.  General: Well developed, well nourished, male in no acute distress Head: Eyes PERRLA, No xanthomas.   Normocephalic and atraumatic, oropharynx without edema or exudate.  Lungs: CTA Heart: Heart irregular rate and rhythm with S1, S2 No murmur. pulses are 2+ extrem.   Neck: No carotid bruits. No lymphadenopathy.  No JVD. Abdomen: Bowel sounds present, abdomen soft and non-tender without masses or hernias noted. Msk:  No spine or cva tenderness. No weakness, no joint deformities or effusions. Extremities: No clubbing or cyanosis.  No edema.  Neuro: Alert and oriented X 3. No focal deficits noted. Psych:  Good affect, responds appropriately Skin: No rashes or lesions noted.  Labs:   Lab Results  Component Value Date   WBC 5.5 11/04/2015   HGB 12.3* 11/04/2015   HCT 37.4* 11/04/2015   MCV 101.4* 11/04/2015   PLT 155 11/04/2015    Recent Labs  11/04/15 0713    INR 4.14*    Recent Labs Lab 11/04/15 0713  NA 137  K 4.4  CL 104  CO2 26  BUN 14  CREATININE 1.10  CALCIUM 9.3  GLUCOSE 135*   MAGNESIUM  Date Value Ref Range Status  08/28/2013 2.0 1.5 - 2.5 mg/dL Final    Recent Labs  11/03/15 1908 11/04/15 0044 11/04/15 0713  TROPONINI 0.04* 0.05* 0.05*    Recent Labs  11/03/15 1549  TROPIPOC 0.01    Lab Results  Component Value Date   CHOL 101 11/03/2015   HDL 37* 11/03/2015   LDLCALC 50 11/03/2015   TRIG 68 11/03/2015     ECG:  NSR with RBBB  Radiology:  Ct Head Wo Contrast  11/03/2015  CLINICAL DATA:  Syncopal episode this afternoon. EXAM: CT HEAD WITHOUT CONTRAST TECHNIQUE: Contiguous axial images were obtained from the base of the skull through the vertex without intravenous contrast. COMPARISON:  03/15/2014 FINDINGS: Ventricles, cisterns and other CSF spaces are within normal as there is minimal age related atrophic change. Mild chronic ischemic microvascular disease. There is no mass, mass effect, shift of midline structures or acute hemorrhage. No evidence of acute infarction. Bones and soft tissues are within normal. IMPRESSION: No acute intracranial findings. Mild chronic ischemic microvascular disease and age related atrophic change. Electronically Signed   By: Marin Olp M.D.   On: 11/03/2015 20:57   Dg Chest Port 1 View  11/03/2015  CLINICAL DATA:  Chest pressure. EXAM: PORTABLE CHEST 1 VIEW COMPARISON:  08/09/2015 FINDINGS: Diffuse interstitial prominence throughout the lungs, stable. No acute airspace opacities or effusions. Heart is normal size. No acute bony abnormality. IMPRESSION: Stable chronic interstitial disease.  No acute findings. Electronically Signed   By: Rolm Baptise M.D.   On: 11/03/2015 15:53    ASSESSMENT AND PLAN:    Principal Problem:   Chest discomfort Active Problems:   Coronary artery disease   Macrocytic anemia   SOB (shortness of breath)   Paroxysmal atrial fibrillation (HCC)    Cardiomyopathy (HCC)   Chronic systolic CHF (congestive heart failure) (HCC)   Non-insulin dependent type 2 diabetes mellitus (HCC)   Near syncope   Transient Hypotension   Generalized weakness   Supratherapeutic INR   Hyperglycemia, unspecified   Anemia, unspecified   Chronic anticoagulation   Altered awareness, transient   1. Chest pain with history of CAD: [name] presents after having acute onset chest pain with associated SOB and diaphoresis. He reports having 2 episodes of this pain in the past week and half. He frequently uses his SL Nitro and was seen this month by Dr. [name] who increased his Isosorbide to 90mg  daily and increased his Ranexa to 1000mg  BID. He had a left heart cath in 2015 that showed patent stent to his LAD. He is fairly active for his age and takes care of himself. Troponin is mildly elevated at 0.05 with flat trend. MD to advise on the need for ischemic evaluation, probably not the best candidate given advanced age and co-morbities. He was hypotensive upon arrival of EMS. Would not continue Isosorbide at 90mg  dosing. He has been getting 60mg  here in the hospital and BP  is stable.   2. Paroxysmal atrial fibrillation: Had failed TEE/DCCV in the past due to inability to pass scope. He is rate controlled with Coreg. He is on Coumadin at home. Denies palpitations.   This patients CHA2DS2-VASc Score and unadjusted Ischemic Stroke Rate (% per year) is equal to 9.7 % stroke rate/year from a score of 6 Above score calculated as 1 point each if present [CHF, HTN, DM, Vascular=MI/PAD/Aortic Plaque, Age if 65-74, or Male], 2 points each if present [Age > 75, or Stroke/TIA/TE]   3. Chronic systolic CHF: Last Echo was in 2013, EF was 35-40%. Will repeat Echo outpatient. His Coreg was decreased from 25mg  BID to 12.5mg  BID per primary team for the initial episode of bradycardia. Can increase back to 25mg  BID outpatient if HR stable.    Signed: Arbutus Leas,  NP 11/04/2015 1:18 PM Pager [phone]  Co-Sign MD  Patient examined chart reviewed. As indicated patient wants to go home. No current pain Troponin .05 no evolution and no acute ECG changes. Despite age I would think outpatient lexiscan would be a good way to risk stratify and if high risk hold coumadin and proceed with Cath He seems very viable and functional for his age and has chronic pains/angina on max Rx including nitrates and rannexa. Exam benign BP better soft SEM clear lungs and no edema  Jenkins Rouge

## 2015-11-04 NOTE — Care Management Obs Status (Signed)
[place] NOTIFICATION   Patient Details  Name: [name] MRN: [id number] Date of Birth: [birth year]   Medicare Observation Status Notification Given:  Yes    Dawayne Patricia, RN 11/04/2015, 11:09 AM

## 2015-11-04 NOTE — Progress Notes (Signed)
CRITICAL VALUE ALERT  Critical value received:  PT 39.0   INR 4.14  Date of notification:  11/04/15  Time of notification:  0815  Critical value read back:Yes.    Nurse who received alert:  Aarish Rockers  MD notified (1st page):   Johnson,C  Time of first page:  0825  MD notified (2nd page):  Time of second page:  Responding MD:  Wynetta Emery, C.  Time MD responded:  [id number]

## 2015-11-04 NOTE — Evaluation (Signed)
Physical Therapy Evaluation Patient Details Name: [name] MRN: [id number] DOB: December 30, 1923 Today's Date: 11/04/2015   History of Present Illness  Pt adm with syncope. PMH - CAD, HTN, DM, colon CA, CHF  Clinical Impression  Pt doing well with mobility and no further PT needed.  Ready for dc from PT standpoint.      Follow Up Recommendations No PT follow up    Equipment Recommendations  None recommended by PT    Recommendations for Other Services       Precautions / Restrictions Precautions Precautions: None      Mobility  Bed Mobility Overal bed mobility: Independent                Transfers Overall transfer level: Modified independent                  Ambulation/Gait Ambulation/Gait assistance: Modified independent (Device/Increase time) Ambulation Distance (Feet): 350 Feet Assistive device: None Gait Pattern/deviations: Step-through pattern   Gait velocity interpretation: at or above normal speed for age/gender General Gait Details: Slightly unsteady but no loss of balance  Stairs            Wheelchair Mobility    Modified Rankin (Stroke Patients Only)       Balance Overall balance assessment: No apparent balance deficits (not formally assessed)                                           Pertinent Vitals/Pain Pain Assessment: No/denies pain    Home Living Family/patient expects to be discharged to:: Private residence Living Arrangements: Alone   Type of Home: Other(Comment) (condo) Home Access: Level entry     Home Layout: One level Home Equipment: None      Prior Function Level of Independence: Independent         Comments: Drives     Hand Dominance        Extremity/Trunk Assessment   Upper Extremity Assessment: Overall WFL for tasks assessed           Lower Extremity Assessment: Overall WFL for tasks assessed         Communication   Communication: No difficulties   Cognition Arousal/Alertness: Awake/alert Behavior During Therapy: WFL for tasks assessed/performed Overall Cognitive Status: Within Functional Limits for tasks assessed                      General Comments      Exercises        Assessment/Plan    PT Assessment Patent does not need any further PT services  PT Diagnosis Difficulty walking   PT Problem List    PT Treatment Interventions     PT Goals (Current goals can be found in the Care Plan section) Acute Rehab PT Goals PT Goal Formulation: All assessment and education complete, DC therapy    Frequency     Barriers to discharge        Co-evaluation               End of Session   Activity Tolerance: Patient tolerated treatment well Patient left: in bed (sitting EOB)      Functional Assessment Tool Used: clinical judgement Functional Limitation: Mobility: Walking and moving around Mobility: Walking and Moving Around Current Status [id number]): 0 percent impaired, limited or restricted Mobility: Walking and Moving Around Goal Status [id number]):  0 percent impaired, limited or restricted Mobility: Walking and Moving Around Discharge Status [phone]): 0 percent impaired, limited or restricted    Time: 1133-1141 PT Time Calculation (min) (ACUTE ONLY): 8 min   Charges:   PT Evaluation $PT Eval Low Complexity: 1 Procedure     PT G Codes:   PT G-Codes **NOT FOR INPATIENT CLASS** Functional Assessment Tool Used: clinical judgement Functional Limitation: Mobility: Walking and moving around Mobility: Walking and Moving Around Current Status [id number]): 0 percent impaired, limited or restricted Mobility: Walking and Moving Around Goal Status [id number]): 0 percent impaired, limited or restricted Mobility: Walking and Moving Around Discharge Status [phone]): 0 percent impaired, limited or restricted    [facility] 11/04/2015, 11:52 AM [facility] PT [phone]

## 2015-11-05 ENCOUNTER — Ambulatory Visit: Payer: Medicare Other | Admitting: Cardiovascular Disease

## 2015-11-06 ENCOUNTER — Ambulatory Visit: Payer: Medicare Other | Admitting: Cardiovascular Disease

## 2015-11-14 ENCOUNTER — Ambulatory Visit (HOSPITAL_COMMUNITY): Payer: Medicare Other | Attending: Cardiovascular Disease

## 2015-11-14 DIAGNOSIS — I5022 Chronic systolic (congestive) heart failure: Secondary | ICD-10-CM | POA: Insufficient documentation

## 2015-11-14 DIAGNOSIS — I350 Nonrheumatic aortic (valve) stenosis: Secondary | ICD-10-CM | POA: Diagnosis not present

## 2015-11-14 DIAGNOSIS — I110 Hypertensive heart disease with heart failure: Secondary | ICD-10-CM | POA: Insufficient documentation

## 2015-11-14 DIAGNOSIS — E785 Hyperlipidemia, unspecified: Secondary | ICD-10-CM | POA: Insufficient documentation

## 2015-11-14 DIAGNOSIS — I2510 Atherosclerotic heart disease of native coronary artery without angina pectoris: Secondary | ICD-10-CM | POA: Insufficient documentation

## 2015-11-14 DIAGNOSIS — Z87891 Personal history of nicotine dependence: Secondary | ICD-10-CM | POA: Diagnosis not present

## 2015-11-14 DIAGNOSIS — I5021 Acute systolic (congestive) heart failure: Secondary | ICD-10-CM | POA: Diagnosis present

## 2015-11-14 LAB — ECHOCARDIOGRAM COMPLETE
AOASC: 32 cm
AOPV: 0.34 m/s
AOVTI: 48.4 cm
AV Area VTI index: 0.68 cm2/m2
AV Area VTI: 1.17 cm2
AV pk vel: 232 cm/s
AVAREAMEANV: 0.98 cm2
AVAREAMEANVIN: 0.53 cm2/m2
AVCELMEANRAT: 0.28
AVG: 13 mmHg
AVPG: 22 mmHg
CHL CUP AV PEAK INDEX: 0.63
CHL CUP AV VEL: 1.27
DOP CAL AO MEAN VELOCITY: 164 cm/s
FS: 33 % (ref 28–44)
IVS/LV PW RATIO, ED: 1.06
LA diam end sys: 32 mm
LA diam index: 1.72 cm/m2
LA vol index: 23.7 mL/m2
LASIZE: 32 mm
LAVOL: 44.1 mL
LAVOLA4C: 40.3 mL
LDCA: 3.46 cm2
LVOT SV: 62 mL
LVOT VTI: 17.8 cm
LVOT diameter: 21 mm
LVOT peak vel: 78.3 cm/s
LVOTVTI: 0.37 cm
PW: 10.2 mm — AB (ref 0.6–1.1)
Valve area index: 0.68
Valve area: 1.27 cm2

## 2015-11-21 NOTE — Progress Notes (Signed)
Cardiology Office Note:    Date:  11/24/2015   ID:  [name], DOB [birth year], MRN [id number]  PCP:  Irven Shelling, MD  Cardiologist:  Dr. Liam Rogers   Electrophysiologist:  n/a  Referring MD: Lavone Orn, MD   Chief Complaint  Patient presents with  . Hospitalization Follow-up    chest pain, near syncope    History of Present Illness:    [name] is a 82 y.o. male with a hx of CAD status post prior PCI to the LAD in 2012, chronic PVCs/PACs, HTN, diabetes, HL, systolic CHF, PAF (failed TEE-DCCV), prior lower GI bleed. LHC in 2015 demonstrated patent stent. [name] been managed with antianginal therapy for chest discomfort. Last seen by Dr. [name] 10/10/15. He continued to have anginal symptoms. At that time, it was not felt that repeat stress testing would be helpful. Ranexa and isosorbide were adjusted.  CHADS2-VASc= 6 (age, HTN, CHF, vascular disease, diabetes).    Admitted 6/26-6/27 with chest discomfort and near syncope. He was having coffee with friends at [facility] at the time of the event. Blood pressure per EMS was 80/40 per the records. Blood sugar was elevated upon presentation. He had been out of diabetic medications for a week. Heart rate was also slow. Beta blocker dose was reduced as well as his isosorbide dose. He was seen by Dr. Johnsie Cancel for cardiology consultation. The patient insisted on going home.  His troponin levels were minimally elevated without clear trend. Echo suggested normal LV function. Outpatient nuclear stress test could be considered.  He returns for follow-up.  Here alone.  Continues to note L sided chest discomfort.  This occurs at rest. It is not necessarily brought on by exertion.  He denies CP with positional changes, lying supine or with meals.  He denies pleuritic CP.  Denies syncope. Denies orthopnea, PND, edema. He has tried NTG on occasion and thinks that it may help.  His symptoms are no worse (may be a little better) since DC  from the hospital.  He denies assoc dyspnea, radiation with his CP. He does note DOE.  Past Medical History  Diagnosis Date  . Coronary artery disease     a. s/p PCI w/ DES to mLAD 08/05/10. b. NSTEMI 08/2013 (mildly elev trop) - cath showing widely patent stent, 80% prox D1 (small vessel) with recommendation for medical management, residual 60% ostial PDA, <20% LCx, LVEF 55-65%.   . Hypertension   . Hyperlipidemia   . Barrett esophagus   . Gallstone pancreatitis 2011    a. s/p chole.  . SBO (small bowel obstruction) ([place]) 2005  . Macrocytic anemia 06/25/2011  . PAF (paroxysmal atrial fibrillation) ([place])     a. failed TEE due to inability to pass probe into the esophagus in March 2013. b. On Coumadin. Was in NSR 08/2013 admission.  . Chronic anticoagulation   . Depression   . GERD (gastroesophageal reflux disease)   . Colon cancer ([place])     a. 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 b.  "graduated" from their practice 06/2013, remains free of any new disease per those notes.  . Type II diabetes mellitus ([place])   . Premature atrial contractions   . Premature ventricular contractions   . Aortic stenosis     a. Mild-mod by echo 2013.  [name] Chronic systolic CHF (congestive heart failure) ([facility])     a. EF previously 35-40%. b. improved to 55-65% by cath 08/2013.  . Myelodysplastic disease ([place])  a. Suspected low-grade  myelodysplastic disease per onc notes.  . Other pancytopenia ([place]) 06/18/2014    Mild, fluctuating, likely med related. Rule out early MDS from prior chemo/RT or colon cancer    Past Surgical History  Procedure Laterality Date  . Cholecystectomy  2011  . Right colectomy    . Coronary angioplasty with stent placement  08/05/10    DES to the LAD  . Cardiac catheterization  08/03/10  . Cardiac catheterization  08/09/10    LAD 30, stent OK, CFX 40, RCA < 20  . Abdominal mass resection      Mass near mesentery  . Cystoscopy      "with removal of kidney stone in office"   . Cataract extraction Bilateral   . Appendectomy      "took out during colon surgery"  . Cystoscopy with retrograde pyelogram, ureteroscopy and stent placement Bilateral 05/18/2013    Procedure: CYSTOSCOPY WITH BILATERAL RETROGRADE PYELOGRAM, LEFT URETEROSCOPY AND LEFT STENT PLACEMENT;  Surgeon: Alexis Frock, MD;  Location: [facility] ORS;  Service: Urology;  Laterality: Bilateral;  . Colon surgery    . Tonsillectomy    . Left heart catheterization with coronary angiogram N/A 08/15/2013    Procedure: LEFT HEART CATHETERIZATION WITH CORONARY ANGIOGRAM;  Surgeon: Peter M Martinique, MD;  Location: [facility] CATH LAB;  Service: Cardiovascular;  Laterality: N/A;    Current Medications: Outpatient Prescriptions Prior to Visit  Medication Sig Dispense Refill  . atorvastatin (LIPITOR) 40 MG tablet TAKE ONE TABLET BY MOUTH ONCE DAILY 6PM 90 tablet 1  . carvedilol (COREG) 12.5 MG tablet Take 1 tablet (12.5 mg total) by mouth 2 (two) times daily with a meal. 30 tablet 0  . Cholecalciferol (VITAMIN D-3 PO) Take 1 tablet by mouth daily.     [name] donepezil (ARICEPT) 5 MG tablet Take 5 mg by mouth at bedtime.     . finasteride (PROSCAR) 5 MG tablet Take 5 mg by mouth at bedtime.     [name] glimepiride (AMARYL) 4 MG tablet Take 4 mg by mouth daily after breakfast.     . isosorbide mononitrate (IMDUR) 60 MG 24 hr tablet Take 1 tablet (60 mg total) by mouth daily. 135 tablet 3  . linagliptin (TRADJENTA) 5 MG TABS tablet Take 1 tablet (5 mg total) by mouth daily. For blood sugar 30 tablet 1  . lisinopril (PRINIVIL,ZESTRIL) 5 MG tablet Take 5 mg by mouth daily.    [name] loratadine (CLARITIN) 10 MG tablet Take 10 mg by mouth daily as needed for allergies.    . nitroGLYCERIN (NITROSTAT) 0.4 MG SL tablet Place 1 tablet (0.4 mg total) under the tongue every 5 (five) minutes as needed for chest pain. 100 tablet 5  . omeprazole (PRILOSEC) 20 MG capsule Take 20 mg by mouth daily.    Vladimir Faster Glycol-Propyl Glycol (SYSTANE OP) Place 1 drop into  both eyes as directed. 2-4 times a day    . ranolazine (RANEXA) 1000 MG SR tablet Take 1 tablet (1,000 mg total) by mouth 2 (two) times daily. 60 tablet 11  . warfarin (COUMADIN) 4 MG tablet Take 0.5-1 tablets (2-4 mg total) by mouth daily. Take 2 mg on Tues / Thurs ** Take 4 mg on Sun / Mon / Wed / Fri / Sat  START ON 09/18/14-Wednesday-MAKE SURE YOU GO TO YOUR PRIMARY DOCTOR'S OFFICE FOR CBC/INR CHECK WITHIN 1 WEEK (Patient taking differently: Take 4 mg by mouth daily. )    . Melatonin (RA MELATONIN) 10 MG TABS  Take 10 mg by mouth daily as needed. Reported on 11/24/2015     No facility-administered medications prior to visit.      Allergies:   Metformin and related   Social History   Social History  . Marital Status: Single    Spouse Name: N/A  . Number of Children: 0  . Years of Education: N/A   Social History Main Topics  . Smoking status: Former Smoker -- 1.00 packs/day for 30 years    Types: Cigarettes    Quit date: 05/10/1994  . Smokeless tobacco: Never Used  . Alcohol Use: 1.2 oz/week    2 Shots of liquor per week     Comment: occasionally.  . Drug Use: No  . Sexual Activity: No   Other Topics Concern  . None   Social History Narrative   Lives alone.       Family History:  The patient's family history includes Cancer in his mother; Heart attack (age of onset: 92) in his brother; Ulcers in his father; Ulcers (age of onset: 69) in his father.   ROS:   Please see the history of present illness.    Review of Systems  Cardiovascular: Positive for chest pain and dyspnea on exertion.   All other systems reviewed and are negative.   Physical Exam:    VS:  BP 138/70 mmHg  Pulse 90  Ht 5\' 9"  (1.753 m)  Wt 152 lb 6.4 oz (69.128 kg)  BMI 22.50 kg/m2    Wt Readings from Last 3 Encounters:  11/24/15 152 lb 6.4 oz (69.128 kg)  11/04/15 157 lb (71.215 kg)  10/10/15 155 lb 12.8 oz (70.67 kg)     Physical Exam  Constitutional: He is oriented to person, place, and  time. He appears well-developed and well-nourished. No distress.  HENT:  Head: Normocephalic and atraumatic.  Neck: No JVD present.  Cardiovascular: Normal rate, regular rhythm and normal heart sounds.   No murmur heard. Pulmonary/Chest: Effort normal and breath sounds normal. He has no wheezes. He has no rales.  Abdominal: Soft. There is no tenderness.  Musculoskeletal: He exhibits no edema.  Neurological: He is alert and oriented to person, place, and time.  Skin: Skin is warm and dry.  Psychiatric: He has a normal mood and affect.    Studies/Labs Reviewed:   EKG:  EKG is  ordered today.  The ekg ordered today demonstrates NSR, HR 91, rightward axis, RBBB  Recent Labs: 08/10/2015: B Natriuretic Peptide 223.9* 11/03/2015: TSH 1.727 11/04/2015: BUN 14; Creatinine, Ser 1.10; Hemoglobin 12.3*; Platelets 155; Potassium 4.4; Sodium 137   Recent Lipid Panel    Component Value Date/Time   CHOL 101 11/03/2015 1908   TRIG 68 11/03/2015 1908   HDL 37* 11/03/2015 1908   CHOLHDL 2.7 11/03/2015 1908   VLDL 14 11/03/2015 1908   LDLCALC 50 11/03/2015 1908    Additional studies/ records that were reviewed today include:   Echo 11/14/15 - Left ventricle: The cavity size was normal. Systolic function was   normal. Wall motion was normal; there were no regional wall   motion abnormalities. Doppler parameters are consistent with   abnormal left ventricular relaxation (grade 1 diastolic   dysfunction). - Aortic valve: There was mild stenosis.  Mean 13 mmHg, peak 22 mmHg - Right atrium: ? RA band cor triatriatum not clinically   significant - Atrial septum: No defect or patent foramen ovale was identified.  Carotid US 11/04/15 Bilateral - 1% to 39% ICA stenosis. Vertebral  artery flow was antegrade.  LHC 4/15 Coronary angiography: Left mainstem: Normal.   Left anterior descending (LAD): The stent in the LAD is widely patent. There is 30% disease in the mid and distal vessel. The first diagonal  is small and has a 80% stenosis proximally. Left circumflex (LCx): Mild disease less than 20%. Right coronary artery (RCA): Very large dominant vessel with mild irregularities. There is focal 60% stenosis in the ostium of the PDA. Left ventriculography: Left ventricular systolic function is normal, LVEF is estimated at 55-65%, there is no significant mitral regurgitation  Final Conclusions:   1. Single vessel obstructive CAD involving a small diagonal branch. The LAD stent is patent. 2. Normal LV function. Recommendations: Continue medical therapy. Will resume coumadin per pharmacy.  Myoview 8/14 IMPRESSION: 1.  No reversible ischemia. 2.   Calculated ejection fraction of 47%  ASSESSMENT:    1. Other chest pain   2. Coronary artery disease involving native coronary artery of native heart with angina pectoris ([place])   3. Chronic systolic CHF (congestive heart failure) ([facility])   4. Paroxysmal atrial fibrillation ([place])   5. Essential hypertension    PLAN:    In order of problems listed above:  1. Chest pain - Recent admission to the hospital with chest pain and near syncope. Blood pressure was quite low upon presentation. He also had elevated blood sugars. Troponin levels were just minimally elevated without clear trend. Echocardiogram suggests normal LV function.  He continues to have chest discomfort.  It is somewhat atypical.  I reviewed his case with Dr. [name] and we reviewed his prior LHC.  He has some small vessel disease that is likely the cause of his symptoms. Given his advanced age, Dr. [name] did not feel that proceeding with ischemic testing at this point would be helpful.  I reviewed this with the patient.  Continue current Rx. FU with Dr. Liana Crocker in 3 mos.   2. CAD - Prior LAD stenting. Cardiac catheterization in 2015 with patent LAD stent and normal LV function. No aspirin as he is on Coumadin. Continue statin therapy. Continue current antianginal therapy and follow-up with Dr.  [name] in 3 months as noted above.  3. Chronic systolic CHF  - Previous EF 35-40%. LHC in 2015 with normal EF. Recent echocardiogram during admission this month with suggestion of normal LV function. Continue beta blocker, ACE inhibitor, nitrates.  4. PAF  - Maintaining normal sinus rhythm. He remains on Coumadin. Recent admission with chest pain and near syncope. Heart rate was noted below and beta blocker dose was adjusted.  5. HTN - Blood pressure improved on current regimen.   Medication Adjustments/Labs and Tests Ordered: Current medicines are reviewed at length with the patient today.  Concerns regarding medicines are outlined above.  Medication changes, Labs and Tests ordered today are outlined in the Patient Instructions noted below. Patient Instructions  Medication Instructions:  Your physician recommends that you continue on your current medications as directed. Please refer to the Current Medication list given to you today. Labwork: NONE Testing/Procedures: NONE Follow-Up: DR. [name] 02/23/2016 @ 1:45  Any Other Special Instructions Will Be Listed Below (If Applicable). If you need a refill on your cardiac medications before your next appointment, please call your pharmacy.   Signed, Richardson Dopp, PA-C  11/24/2015 9:29 AM    [facility] [address], [place], [place]  [address] Phone: [phone]; Fax: [phone]

## 2015-11-24 ENCOUNTER — Encounter (INDEPENDENT_AMBULATORY_CARE_PROVIDER_SITE_OTHER): Payer: Self-pay

## 2015-11-24 ENCOUNTER — Encounter: Payer: Self-pay | Admitting: Physician Assistant

## 2015-11-24 ENCOUNTER — Ambulatory Visit (INDEPENDENT_AMBULATORY_CARE_PROVIDER_SITE_OTHER): Payer: Medicare Other | Admitting: Physician Assistant

## 2015-11-24 VITALS — BP 138/70 | HR 90 | Ht 69.0 in | Wt 152.4 lb

## 2015-11-24 DIAGNOSIS — I5022 Chronic systolic (congestive) heart failure: Secondary | ICD-10-CM

## 2015-11-24 DIAGNOSIS — I10 Essential (primary) hypertension: Secondary | ICD-10-CM

## 2015-11-24 DIAGNOSIS — I25119 Atherosclerotic heart disease of native coronary artery with unspecified angina pectoris: Secondary | ICD-10-CM | POA: Diagnosis not present

## 2015-11-24 DIAGNOSIS — I480 Paroxysmal atrial fibrillation: Secondary | ICD-10-CM | POA: Diagnosis not present

## 2015-11-24 DIAGNOSIS — R0789 Other chest pain: Secondary | ICD-10-CM

## 2015-11-24 NOTE — Patient Instructions (Addendum)
Medication Instructions:  Your physician recommends that you continue on your current medications as directed. Please refer to the Current Medication list given to you today. Labwork: NONE Testing/Procedures: NONE Follow-Up: DR. [name] 02/23/2016 @ 1:45  Any Other Special Instructions Will Be Listed Below (If Applicable). If you need a refill on your cardiac medications before your next appointment, please call your pharmacy.

## 2015-12-18 ENCOUNTER — Telehealth: Payer: Self-pay | Admitting: *Deleted

## 2015-12-18 NOTE — Telephone Encounter (Signed)
Patient called and stated that the ranexa rx is now over $200 for a thirty day supply. He would like to know if there is an alternative to this. I mentioned patient assistance which he states that he is not interested in. He can be reached at [phone]. Thanks, MI

## 2015-12-18 NOTE — Telephone Encounter (Signed)
Spoke with patient about his concerns about the cost of Ranexa.  He states he cannot afford it at this time and is not interested in patient assistance.  He continues to take Imdur 60 mg daily.  I advised that our office has Ranexa samples periodically and we can supplement the cost by giving him samples.  He states at this time he would like to take just the Imdur and see how he does.  I advised him to call back to report worsening chest pain.  I advised that I will discuss with Richardson Dopp, PA to make certain he is in agreement.  Patient verbalized understanding and agreement and thanked me for the call.  I spoke with Richardson Dopp, PA in person and he agrees with plan of care.

## 2015-12-29 ENCOUNTER — Telehealth: Payer: Self-pay | Admitting: Cardiovascular Disease

## 2015-12-29 MED ORDER — RANOLAZINE ER 500 MG PO TB12: 500.0000 mg | ORAL_TABLET | Freq: Two times a day (BID) | ORAL | 11 refills | Status: DC

## 2015-12-29 NOTE — Telephone Encounter (Signed)
New Message  Pt c/o medication issue:  1. Name of Medication: Ranolazine (Ranexa)  2. How are you currently taking this medication (dosage and times per day)? 1000 mg twice daily  3. Are you having a reaction (difficulty breathing--STAT)? No  4. What is your medication issue? Pt voiced this medication is $240.00, pt wants to know if there's a different medication he can be on due to the cost of this medication.

## 2015-12-29 NOTE — Telephone Encounter (Signed)
Spoke with patient who states he cannot afford to continue to get Ranexa refills.  He does not recall talking with me on 8/10 about this issue and he has been taking Ranexa 500 mg twice daily for several weeks.  He states he picked up samples from our office.  I advised that I will place additional samples of Ranexa 500 mg at the front desk for him to pick up. I advised that he may need to get a refill at some point as we are not always able to supplement with samples.  He states he does not want to stop the medication at this time.  He thanked me for the call.

## 2016-01-13 ENCOUNTER — Other Ambulatory Visit: Payer: Self-pay | Admitting: Dermatology

## 2016-01-16 ENCOUNTER — Other Ambulatory Visit: Payer: Self-pay | Admitting: Cardiovascular Disease

## 2016-01-19 ENCOUNTER — Telehealth: Payer: Self-pay | Admitting: Cardiovascular Disease

## 2016-01-19 MED ORDER — RANOLAZINE ER 500 MG PO TB12: 500.0000 mg | ORAL_TABLET | Freq: Every day | ORAL | Status: DC

## 2016-01-19 NOTE — Telephone Encounter (Signed)
New message    Pt calling about Ranexa. He states it is to expensive for him to purchase. He wants to know if there is a replacement drug. Please call.

## 2016-01-19 NOTE — Telephone Encounter (Signed)
Spoke with patient who states Ranexa cost him >$2000.  He has picked up samples in the past and was advised that he could stop the medication.  He states he does not want to stop it.  I advised that I will place additional samples at the front desk for him to pick up.  I advised him to take 500 mg once daily rather than twice daily so that the medication will last him a longer time.  I advised that he call back when he is close to running out of medication in 60 days.  He verbalized understanding and agreement with plan and thanked me for the call.

## 2016-02-06 ENCOUNTER — Encounter: Payer: Self-pay | Admitting: Cardiovascular Disease

## 2016-02-23 ENCOUNTER — Ambulatory Visit (INDEPENDENT_AMBULATORY_CARE_PROVIDER_SITE_OTHER): Payer: Medicare Other | Admitting: Cardiovascular Disease

## 2016-02-23 ENCOUNTER — Encounter: Payer: Self-pay | Admitting: Cardiovascular Disease

## 2016-02-23 ENCOUNTER — Encounter (INDEPENDENT_AMBULATORY_CARE_PROVIDER_SITE_OTHER): Payer: Self-pay

## 2016-02-23 VITALS — BP 112/70 | HR 78 | Ht 69.0 in | Wt 155.0 lb

## 2016-02-23 DIAGNOSIS — I10 Essential (primary) hypertension: Secondary | ICD-10-CM | POA: Diagnosis not present

## 2016-02-23 DIAGNOSIS — I2510 Atherosclerotic heart disease of native coronary artery without angina pectoris: Secondary | ICD-10-CM

## 2016-02-23 NOTE — Progress Notes (Signed)
Cardiology Office Note   Date:  02/23/2016   ID:  [name], DOB Jan 07, 1924, MRN [id number]  PCP:  Irven Shelling, MD  Cardiologist:   Mertie Moores, MD   Chief Complaint  Patient presents with  . Coronary Artery Disease   1. CAD - s/p stenting July 2012.  2. Hypertension  3. Hyperlipidemia  4. Diabetes Mellitus  5. Paroxysmal atrial fib.   6. Lower GI bleed -       [name] is seen today for a 2 week check. He has known CAD with prior PCI to the LAD last year. Other issues include chronic PVC's and PACs, HTN, DM, HLD and systolic heart failure. He now has atrial fib and will be managed with rate control and anticoagulation. He had a failed attempt at [place] in March due to inability to pass the probe into the esophagus. His EF is 35 to 40% with mild to moderate AS.  His INR has been therapeutic. He really is not interested in doing cardioversion.  December 23, 2011 - he was initially seen in the emergency room. He has progressive back pain associated with increasing shortness of breath. [name] noticed that if he takes a nitroglycerin at this back pain and his shortness of breath improved. He has these discomforts with minor levels of exertion. He is also having at rest.  March 20, 2012: [name] presented with some episodes of chest pain when I last saw him in August. He had a stress Myoview study that was normal. He still walks almost every day - he goes around [employer] 1-1 1/2 miles a day. He had a brief episode of CP yesterday - relieved with NTG. He had some food poisoning recently ( raw oysters)  Oct 03, 2012:  He has been having some mid abdominal pain with radiation around to the back. He took a NTG with relief. The pain was a dull pain and would last 30 minutes or so. He has been putting in his garden without any without any problems.   Oct. 1, 2014:  [name] is doing ok. He is having problems with his memory. Has trouble getting the  words out sometimes. No CVA symptoms. No focal neuro findings.  No  CP or dyspnea. 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   August 10, 2013:  [name] has been having some chest tightness . Occurs 1-2 times a week. The chest tightness is relieved if he takes a nitroglycerin. he also crushes up a full strength aspirin and takes that. The pains occur with rest. Not associated with eating Or drinking anything. Similar to his previous epidoses of CP but are not as severe.  December 24, 2013:  [name] has been hospitalized since I last saw him. He had some chest pain and a followup cardiac catheterization revealed that his stents were okay. [name] been on Ranexa but complains of the cost is a bit high. The Ranexa seems to be helping quite a bit.    Feb. 18, 2016:    [name] is a 82 y.o. male who presents for follow up of his CAD Fells great.  Has been feeling better on the Ranexa.  Wants to cut the Ranexa to every other day.   Enjoying life.  Not exercising much these days.  Gets his INR levels drawn at his primary medical doctors office.   Sept. 23, 2016: [name] is feeling great. No CP No dyspnea  BP has  been well controlled   Had some bloody stools  - has resolved.   July 30, 2015 Overall doing the same Has occasional CP , takes SL NTG once a week .  Seems to help Does not occur with exertion  Has not been walking much recently  -   October 10, 2015:  Doing ok. Having some left sided chest pain .   Has taken some SL NTG. Also has had some unrelated shoulder pain  Takes the SL NTG almost daily .  Is on Imdur 90 mg a day  And Ranexa 1000  mg BID   Not necessarily due to exertion.     Oct. 16, 2017  Overall doing well. Has some occasional CP, is on Ranexa Is in the doughnut hole and cannot afford it  Does not have to take SL NTG   Past Medical History:  Diagnosis Date  . Aortic stenosis    a. Mild-mod by echo 2013.    . Barrett esophagus   . Chronic anticoagulation   . Chronic systolic CHF (congestive heart failure) (HCC)    a. EF previously 35-40%. b. improved to 55-65% by cath 08/2013.  [name] Colon cancer ([place])    a. 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 b.  "graduated" from their practice 06/2013, remains free of any new disease per those notes.  . Coronary artery disease    a. s/p PCI w/ DES to mLAD 08/05/10. b. NSTEMI 08/2013 (mildly elev trop) - cath showing widely patent stent, 80% prox D1 (small vessel) with recommendation for medical management, residual 60% ostial PDA, <20% LCx, LVEF 55-65%.   . Depression   . Gallstone pancreatitis 2011   a. s/p chole.  [name] GERD (gastroesophageal reflux disease)   . Hyperlipidemia   . Hypertension   . Macrocytic anemia 06/25/2011  . Myelodysplastic disease ([place])    a. Suspected low-grade  myelodysplastic disease per onc notes.  . Other pancytopenia ([place]) 06/18/2014   Mild, fluctuating, likely med related. Rule out early MDS from prior chemo/RT or colon cancer  . PAF (paroxysmal atrial fibrillation) ([place])    a. failed TEE due to inability to pass probe into the esophagus in March 2013. b. On Coumadin. Was in NSR 08/2013 admission.  . Premature atrial contractions   . Premature ventricular contractions   . SBO (small bowel obstruction) 2005  . Type II diabetes mellitus ([place])     Past Surgical History:  Procedure Laterality Date  . ABDOMINAL MASS RESECTION     Mass near mesentery  . APPENDECTOMY     "took out during colon surgery"  . CARDIAC CATHETERIZATION  08/03/10  . CARDIAC CATHETERIZATION  08/09/10   LAD 30, stent OK, CFX 40, RCA < 20  . CATARACT EXTRACTION Bilateral   . CHOLECYSTECTOMY  2011  . COLON SURGERY    . CORONARY ANGIOPLASTY WITH STENT PLACEMENT  08/05/10   DES to the LAD  . CYSTOSCOPY     "with removal of kidney stone in office"  . CYSTOSCOPY WITH RETROGRADE PYELOGRAM, URETEROSCOPY AND STENT PLACEMENT Bilateral 05/18/2013    Procedure: CYSTOSCOPY WITH BILATERAL RETROGRADE PYELOGRAM, LEFT URETEROSCOPY AND LEFT STENT PLACEMENT;  Surgeon: Alexis Frock, MD;  Location: [facility];  Service: Urology;  Laterality: Bilateral;  . LEFT HEART CATHETERIZATION WITH CORONARY ANGIOGRAM N/A 08/15/2013   Procedure: LEFT HEART CATHETERIZATION WITH CORONARY ANGIOGRAM;  Surgeon: Peter M Martinique, MD;  Location: [facility] CATH LAB;  Service: Cardiovascular;  Laterality: N/A;  . RIGHT COLECTOMY    .  TONSILLECTOMY       Current Outpatient Prescriptions  Medication Sig Dispense Refill  . atorvastatin (LIPITOR) 40 MG tablet TAKE ONE TABLET BY MOUTH ONCE DAILY AT  6  PM 90 tablet 2  . carvedilol (COREG) 12.5 MG tablet Take 1 tablet (12.5 mg total) by mouth 2 (two) times daily with a meal. 30 tablet 0  . Cholecalciferol (VITAMIN D-3 PO) Take 1 tablet by mouth daily.     [name] donepezil (ARICEPT) 5 MG tablet Take 5 mg by mouth at bedtime.     . finasteride (PROSCAR) 5 MG tablet Take 5 mg by mouth at bedtime.     [name] FLUZONE HIGH-DOSE 0.5 ML SUSY Inject as directed once.    [name] glimepiride (AMARYL) 4 MG tablet Take 4 mg by mouth daily after breakfast.     . isosorbide mononitrate (IMDUR) 60 MG 24 hr tablet Take 1 tablet (60 mg total) by mouth daily. 135 tablet 3  . linagliptin (TRADJENTA) 5 MG TABS tablet Take 1 tablet (5 mg total) by mouth daily. For blood sugar 30 tablet 1  . lisinopril (PRINIVIL,ZESTRIL) 5 MG tablet Take 5 mg by mouth daily.    [name] loratadine (CLARITIN) 10 MG tablet Take 10 mg by mouth daily as needed for allergies.    . nitroGLYCERIN (NITROSTAT) 0.4 MG SL tablet Place 1 tablet (0.4 mg total) under the tongue every 5 (five) minutes as needed for chest pain. 100 tablet 5  . omeprazole (PRILOSEC) 20 MG capsule Take 20 mg by mouth daily.    Vladimir Faster Glycol-Propyl Glycol (SYSTANE OP) Place 1 drop into both eyes as directed. 2-4 times a day    . ranolazine (RANEXA) 500 MG 12 hr tablet Take 1 tablet (500 mg total) by mouth daily.    [name] warfarin  (COUMADIN) 4 MG tablet Take 0.5-1 tablets (2-4 mg total) by mouth daily. Take 2 mg on Tues / Thurs ** Take 4 mg on Sun / Mon / Wed / Fri / Sat  START ON 09/18/14-Wednesday-MAKE SURE YOU GO TO YOUR PRIMARY DOCTOR'S OFFICE FOR CBC/INR CHECK WITHIN 1 WEEK (Patient taking differently: Take 4 mg by mouth daily. )     No current facility-administered medications for this visit.     Allergies:   Metformin and related    Social History:  The patient  reports that he quit smoking about 21 years ago. His smoking use included Cigarettes. He has a 30.00 pack-year smoking history. He has never used smokeless tobacco. He reports that he drinks about 1.2 oz of alcohol per week . He reports that he does not use drugs.   Family History:  The patient's family history includes Cancer in his mother; Heart attack (age of onset: 38) in his brother; Ulcers (age of onset: 40) in his father.    ROS:  Please see the history of present illness.    Review of Systems: Constitutional:  denies fever, chills, diaphoresis, appetite change and fatigue.  HEENT: denies photophobia, eye pain, redness, hearing loss, ear pain, congestion, sore throat, rhinorrhea, sneezing, neck pain, neck stiffness and tinnitus.  Respiratory: denies SOB, DOE, cough, chest tightness, and wheezing.  Cardiovascular: denies chest pain, palpitations and leg swelling.  Gastrointestinal: denies nausea, vomiting, abdominal pain, diarrhea, constipation, blood in stool.  Genitourinary: denies dysuria, urgency, frequency, hematuria, flank pain and difficulty urinating.  Musculoskeletal: denies  myalgias, back pain, joint swelling, arthralgias and gait problem.   Skin: denies pallor, rash and wound.  Neurological: denies dizziness, seizures,  syncope, weakness, light-headedness, numbness and headaches.   Hematological: denies adenopathy, easy bruising, personal or family bleeding history.  Psychiatric/ Behavioral: denies suicidal ideation, mood changes,  confusion, nervousness, sleep disturbance and agitation.       All other systems are reviewed and negative.    PHYSICAL EXAM: VS:  BP 112/70 (BP Location: Left Arm, Patient Position: Sitting, Cuff Size: Normal)   Pulse 78   Ht 5\' 9"  (1.753 m)   Wt 155 lb (70.3 kg)   BMI 22.89 kg/m  , BMI Body mass index is 22.89 kg/m. GEN: Well nourished, well developed, in no acute distress  HEENT: normal  Neck: no JVD, soft left carotid bruit  , no masses Cardiac: RRR  Frequent premature beats. ; no murmurs, rubs, or gallops,no edema  Respiratory:  clear to auscultation bilaterally, normal work of breathing GI: soft, nontender, nondistended, + BS MS: no deformity or atrophy  Skin: warm and dry, no rash Neuro:  Strength and sensation are intact Psych: normal   EKG:  EKG is not ordered today.   Recent Labs: 08/10/2015: B Natriuretic Peptide 223.9 11/03/2015: TSH 1.727 11/04/2015: BUN 14; Creatinine, Ser 1.10; Hemoglobin 12.3; Platelets 155; Potassium 4.4; Sodium 137    Lipid Panel    Component Value Date/Time   CHOL 101 11/03/2015 1908   TRIG 68 11/03/2015 1908   HDL 37 (L) 11/03/2015 1908   CHOLHDL 2.7 11/03/2015 1908   VLDL 14 11/03/2015 1908   LDLCALC 50 11/03/2015 1908      Wt Readings from Last 3 Encounters:  02/23/16 155 lb (70.3 kg)  11/24/15 152 lb 6.4 oz (69.1 kg)  11/04/15 157 lb (71.2 kg)      Other studies Reviewed: Additional studies/ records that were reviewed today include: . Review of the above records demonstrates:    ASSESSMENT AND PLAN:  1. CAD - s/p stenting July 2012.  - he continues to have angina.   Has has diffuse irregularities as well as several stents.    On Imdur, Cannot afford Ranexa at this point - doughnut hole  Will provide samples as they are available Encouraged him to use SL NTG as needed.      He is to continue taking nitroglycerin as needed. If the pain does not go away with nitroglycerin and instructed him to come to the  hospital.  He has a hx of GI bleed.    I hesitate to add  plavix - he is currently on coumadin .   2. Hypertension - pressure is well-controlled. Continue same meds.   3. Hyperlipidemia - stable   4. Diabetes Mellitus   5. Lower GI bleed. He was admitted recently with a lower GI bleed. He is on Coumadin for paroxysmal atrial fibrillation. His INR levels have been monitored by his primary medical doctor. GI bleeding has resolved.   6. Left carotid bruit .   Carotid doppler - shows bilateral mild Carotid disease. No further eval at this point   Current medicines are reviewed at length with the patient today.  The patient does not have concerns regarding medicines.  The following changes have been made:  no change   Disposition:   FU with me in 6 months      Mertie Moores, MD  02/23/2016 1:48 PM    [facility] [address], [place], [place]  [address] Phone: [phone]; Fax: [phone]

## 2016-02-23 NOTE — Patient Instructions (Signed)

## 2016-05-18 ENCOUNTER — Telehealth: Payer: Self-pay | Admitting: Cardiovascular Disease

## 2016-05-18 NOTE — Telephone Encounter (Signed)
Spoke with patient who states he has been having worsening chest pain over the past few weeks. He states he is taking approximately 4 SL NTG daily in addition to Ranexa 1000 mg twice daily. He would like to come in to see Dr. [name].  I advised that Dr. [name] is not in the office again until Friday and offered him an appointment with APP.  I discussed with Dr. [name], who is in the office and he spoke with patient on the phone. Dr. [name] assured him that he should come in for an ekg and evaluation sooner than Friday. I scheduled patient to see Robbie Lis, PA tomorrow.  Patient verbalized understanding and agreement with plan and thanked [place] for the call.

## 2016-05-18 NOTE — Telephone Encounter (Signed)
New Message  Pt voiced for nurse to call him back.  Offered soonest appt 1.29.18 @ 215pm pt declined.  Offered PA and pt voiced to have nurse to call him back.  Please f/u

## 2016-05-19 ENCOUNTER — Observation Stay (HOSPITAL_COMMUNITY)
Admission: EM | Admit: 2016-05-19 | Discharge: 2016-05-21 | Disposition: A | Discharge status: Home / Self Care | Payer: Medicare Other | Attending: Internal Medicine | Admitting: Internal Medicine

## 2016-05-19 ENCOUNTER — Encounter (HOSPITAL_COMMUNITY): Payer: Self-pay | Admitting: *Deleted

## 2016-05-19 ENCOUNTER — Encounter (INDEPENDENT_AMBULATORY_CARE_PROVIDER_SITE_OTHER): Payer: Self-pay

## 2016-05-19 ENCOUNTER — Ambulatory Visit (INDEPENDENT_AMBULATORY_CARE_PROVIDER_SITE_OTHER): Payer: Medicare Other | Admitting: Physician Assistant

## 2016-05-19 ENCOUNTER — Encounter: Payer: Self-pay | Admitting: Physician Assistant

## 2016-05-19 ENCOUNTER — Emergency Department (HOSPITAL_COMMUNITY): Payer: Medicare Other

## 2016-05-19 VITALS — BP 160/60 | HR 82 | Ht 69.0 in | Wt 153.4 lb

## 2016-05-19 DIAGNOSIS — I5042 Chronic combined systolic (congestive) and diastolic (congestive) heart failure: Secondary | ICD-10-CM

## 2016-05-19 DIAGNOSIS — Z7901 Long term (current) use of anticoagulants: Secondary | ICD-10-CM | POA: Diagnosis not present

## 2016-05-19 DIAGNOSIS — E785 Hyperlipidemia, unspecified: Secondary | ICD-10-CM

## 2016-05-19 DIAGNOSIS — R079 Chest pain, unspecified: Secondary | ICD-10-CM | POA: Diagnosis present

## 2016-05-19 DIAGNOSIS — Z8719 Personal history of other diseases of the digestive system: Secondary | ICD-10-CM | POA: Diagnosis not present

## 2016-05-19 DIAGNOSIS — I110 Hypertensive heart disease with heart failure: Secondary | ICD-10-CM | POA: Diagnosis not present

## 2016-05-19 DIAGNOSIS — Z8049 Family history of malignant neoplasm of other genital organs: Secondary | ICD-10-CM | POA: Insufficient documentation

## 2016-05-19 DIAGNOSIS — K219 Gastro-esophageal reflux disease without esophagitis: Secondary | ICD-10-CM | POA: Diagnosis not present

## 2016-05-19 DIAGNOSIS — I252 Old myocardial infarction: Secondary | ICD-10-CM | POA: Insufficient documentation

## 2016-05-19 DIAGNOSIS — I25110 Atherosclerotic heart disease of native coronary artery with unstable angina pectoris: Secondary | ICD-10-CM | POA: Diagnosis not present

## 2016-05-19 DIAGNOSIS — E440 Moderate protein-calorie malnutrition: Secondary | ICD-10-CM | POA: Diagnosis not present

## 2016-05-19 DIAGNOSIS — I491 Atrial premature depolarization: Secondary | ICD-10-CM | POA: Diagnosis not present

## 2016-05-19 DIAGNOSIS — I2583 Coronary atherosclerosis due to lipid rich plaque: Secondary | ICD-10-CM | POA: Diagnosis not present

## 2016-05-19 DIAGNOSIS — I200 Unstable angina: Secondary | ICD-10-CM

## 2016-05-19 DIAGNOSIS — I4510 Unspecified right bundle-branch block: Secondary | ICD-10-CM | POA: Insufficient documentation

## 2016-05-19 DIAGNOSIS — I10 Essential (primary) hypertension: Secondary | ICD-10-CM

## 2016-05-19 DIAGNOSIS — Z8589 Personal history of malignant neoplasm of other organs and systems: Secondary | ICD-10-CM | POA: Insufficient documentation

## 2016-05-19 DIAGNOSIS — E7800 Pure hypercholesterolemia, unspecified: Secondary | ICD-10-CM

## 2016-05-19 DIAGNOSIS — Z85038 Personal history of other malignant neoplasm of large intestine: Secondary | ICD-10-CM | POA: Insufficient documentation

## 2016-05-19 DIAGNOSIS — J45909 Unspecified asthma, uncomplicated: Secondary | ICD-10-CM | POA: Insufficient documentation

## 2016-05-19 DIAGNOSIS — R918 Other nonspecific abnormal finding of lung field: Secondary | ICD-10-CM | POA: Insufficient documentation

## 2016-05-19 DIAGNOSIS — E119 Type 2 diabetes mellitus without complications: Secondary | ICD-10-CM | POA: Diagnosis not present

## 2016-05-19 DIAGNOSIS — Z6822 Body mass index (BMI) 22.0-22.9, adult: Secondary | ICD-10-CM | POA: Insufficient documentation

## 2016-05-19 DIAGNOSIS — I700 Atherosclerosis of aorta: Secondary | ICD-10-CM | POA: Diagnosis not present

## 2016-05-19 DIAGNOSIS — Z87891 Personal history of nicotine dependence: Secondary | ICD-10-CM | POA: Insufficient documentation

## 2016-05-19 DIAGNOSIS — D539 Nutritional anemia, unspecified: Secondary | ICD-10-CM | POA: Diagnosis not present

## 2016-05-19 DIAGNOSIS — Z7902 Long term (current) use of antithrombotics/antiplatelets: Secondary | ICD-10-CM | POA: Insufficient documentation

## 2016-05-19 DIAGNOSIS — I480 Paroxysmal atrial fibrillation: Secondary | ICD-10-CM | POA: Diagnosis not present

## 2016-05-19 DIAGNOSIS — F329 Major depressive disorder, single episode, unspecified: Secondary | ICD-10-CM | POA: Insufficient documentation

## 2016-05-19 DIAGNOSIS — R0989 Other specified symptoms and signs involving the circulatory and respiratory systems: Secondary | ICD-10-CM | POA: Diagnosis not present

## 2016-05-19 DIAGNOSIS — Z955 Presence of coronary angioplasty implant and graft: Secondary | ICD-10-CM | POA: Insufficient documentation

## 2016-05-19 DIAGNOSIS — I350 Nonrheumatic aortic (valve) stenosis: Secondary | ICD-10-CM | POA: Diagnosis not present

## 2016-05-19 DIAGNOSIS — D469 Myelodysplastic syndrome, unspecified: Secondary | ICD-10-CM | POA: Insufficient documentation

## 2016-05-19 DIAGNOSIS — Z8249 Family history of ischemic heart disease and other diseases of the circulatory system: Secondary | ICD-10-CM | POA: Insufficient documentation

## 2016-05-19 DIAGNOSIS — I119 Hypertensive heart disease without heart failure: Secondary | ICD-10-CM

## 2016-05-19 LAB — CBC
HCT: 40.8 % (ref 39.0–52.0)
Hemoglobin: 13.7 g/dL (ref 13.0–17.0)
MCH: 34.9 pg — AB (ref 26.0–34.0)
MCHC: 33.6 g/dL (ref 30.0–36.0)
MCV: 104.1 fL — ABNORMAL HIGH (ref 78.0–100.0)
PLATELETS: 184 10*3/uL (ref 150–400)
RBC: 3.92 MIL/uL — ABNORMAL LOW (ref 4.22–5.81)
RDW: 13.7 % (ref 11.5–15.5)
WBC: 7.6 10*3/uL (ref 4.0–10.5)

## 2016-05-19 LAB — BASIC METABOLIC PANEL
ANION GAP: 9 (ref 5–15)
BUN: 18 mg/dL (ref 6–20)
CALCIUM: 9.7 mg/dL (ref 8.9–10.3)
CO2: 24 mmol/L (ref 22–32)
CREATININE: 1.2 mg/dL (ref 0.61–1.24)
Chloride: 107 mmol/L (ref 101–111)
GFR calc Af Amer: 60 mL/min — ABNORMAL LOW (ref >60)
GFR, EST NON AFRICAN AMERICAN: 51 mL/min — AB (ref >60)
GLUCOSE: 243 mg/dL — AB (ref 65–99)
Potassium: 4.1 mmol/L (ref 3.5–5.1)
Sodium: 140 mmol/L (ref 135–145)

## 2016-05-19 LAB — I-STAT TROPONIN, ED: TROPONIN I, POC: 0.02 ng/mL (ref 0.00–0.08)

## 2016-05-19 LAB — D-DIMER, QUANTITATIVE: D-Dimer, Quant: 0.29 ug/mL-FEU (ref 0.00–0.50)

## 2016-05-19 LAB — PROTIME-INR
INR: 1.59
Prothrombin Time: 19.1 seconds — ABNORMAL HIGH (ref 11.4–15.2)

## 2016-05-19 LAB — GLUCOSE, CAPILLARY: Glucose-Capillary: 143 mg/dL — ABNORMAL HIGH (ref 65–99)

## 2016-05-19 MED ORDER — SODIUM CHLORIDE 0.9% FLUSH: 3.0000 mL | INTRAVENOUS | Status: DC | PRN

## 2016-05-19 MED ORDER — SODIUM CHLORIDE 0.9% FLUSH
3.0000 mL | Freq: Two times a day (BID) | INTRAVENOUS | Status: DC
  Administered 2016-05-20 – 2016-05-21 (×3): 3 mL via INTRAVENOUS

## 2016-05-19 MED ORDER — ONDANSETRON HCL 4 MG/2ML IJ SOLN: 4.0000 mg | Freq: Four times a day (QID) | INTRAMUSCULAR | Status: DC | PRN

## 2016-05-19 MED ORDER — ASPIRIN 81 MG PO CHEW: 81.0000 mg | CHEWABLE_TABLET | ORAL | Status: DC

## 2016-05-19 MED ORDER — SODIUM CHLORIDE 0.9 % IV SOLN
250.0000 mL | INTRAVENOUS | Status: DC | PRN
Start: 2016-05-19 — End: 2016-05-20

## 2016-05-19 MED ORDER — ACETAMINOPHEN 325 MG PO TABS: 650.0000 mg | ORAL_TABLET | ORAL | Status: DC | PRN

## 2016-05-19 MED ORDER — ASPIRIN EC 81 MG PO TBEC: 81.0000 mg | DELAYED_RELEASE_TABLET | Freq: Every day | ORAL | Status: DC

## 2016-05-19 MED ORDER — ASPIRIN 81 MG PO CHEW
324.0000 mg | CHEWABLE_TABLET | ORAL | Status: AC
  Administered 2016-05-20: 324 mg via ORAL
  Filled 2016-05-19: qty 4

## 2016-05-19 MED ORDER — ASPIRIN 300 MG RE SUPP: 300.0000 mg | RECTAL | Status: AC

## 2016-05-19 MED ORDER — NITROGLYCERIN 0.4 MG SL SUBL: 0.4000 mg | SUBLINGUAL_TABLET | SUBLINGUAL | Status: DC | PRN

## 2016-05-19 MED ORDER — SODIUM CHLORIDE 0.9 % IV SOLN
INTRAVENOUS | Status: DC
  Administered 2016-05-20 (×2): via INTRAVENOUS

## 2016-05-19 MED ORDER — HEPARIN SODIUM (PORCINE) 5000 UNIT/ML IJ SOLN
5000.0000 [IU] | Freq: Three times a day (TID) | INTRAMUSCULAR | Status: DC
  Administered 2016-05-20 – 2016-05-21 (×3): 5000 [IU] via SUBCUTANEOUS
  Filled 2016-05-19 (×3): qty 1

## 2016-05-19 NOTE — ED Notes (Signed)
Called lab to add on d-dimer.  

## 2016-05-19 NOTE — ED Notes (Addendum)
Pt provided with bag lunch and drink. When I asked pt if I could get him anything else he responded  "not a damn thing".

## 2016-05-19 NOTE — Progress Notes (Signed)
Cardiology Office Note    Date:  05/19/2016   ID:  [name], DOB [birth year], MRN [id number]  PCP:  Irven Shelling, MD  Cardiologist:  Dr. [name]  Chief Complaint: Chest pain  History of Present Illness:   [name] is a 83 y.o. male CAD s/p DES to [place] in 2012, PAF, HTN, HLD, DM, left carotid bruit, lower GI bleed  and chronic diastolic CHF presents for chest pain.   Last cath 08/2013 showed widely patent stent, 80% prox D1 (small vessel) with recommendation for medical management, residual 60% ostial PDA, <20% LCx.  Last carotid doppler showed Bilateral - 1% to 39% ICA stenosis.  Last echo 11/2015 showed normal systolic function of LV, grade 1 DD.   Last seen by Dr. [name] 02/23/16. She has some occasional cp on Ranexa. Not required to take SL nitro.   Pt has been having worsening chest pain for the past few weeks. He is taking 2-3 SL nitro at time with resolution.  He requires nitro multiple times in week. Today he woke up with a 10 out of 10 substernal chest tightness. No other associated symptoms. Here he complains of 8 out of 10 chest tightness. Took his one sublingual nitroglycerin while I was with him --> improved chest tightness to 5 out of 10 after 5 minutes. His chest tightness still remained 5/10 after another nitro. BP reduced to 100/60 and felt dizzy. Chest chest tightness resolved completely after another 10 minutes. He feels heaviness in his eyes prior to chest tightness. Not checking his BP at home.   He is very independent and lives by himself. He takes warfarin for PAF. INR managed by primary care provider however he states that he goes for blood work once every 2 months or so. ? what medication he is taking.   Past Medical History:  Diagnosis Date  . Aortic stenosis    a. Mild-mod by echo 2013.  . Barrett esophagus   . Chronic anticoagulation   . Chronic systolic CHF (congestive heart failure) (HCC)    a. EF previously 35-40%. b. improved to  55-65% by cath 08/2013.  [name] Colon cancer ([place])    a. 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 b.  "graduated" from their practice 06/2013, remains free of any new disease per those notes.  . Coronary artery disease    a. s/p PCI w/ DES to mLAD 08/05/10. b. NSTEMI 08/2013 (mildly elev trop) - cath showing widely patent stent, 80% prox D1 (small vessel) with recommendation for medical management, residual 60% ostial PDA, <20% LCx, LVEF 55-65%.   . Depression   . Gallstone pancreatitis 2011   a. s/p chole.  [name] GERD (gastroesophageal reflux disease)   . Hyperlipidemia   . Hypertension   . Macrocytic anemia 06/25/2011  . Myelodysplastic disease ([place])    a. Suspected low-grade  myelodysplastic disease per onc notes.  . Other pancytopenia ([place]) 06/18/2014   Mild, fluctuating, likely med related. Rule out early MDS from prior chemo/RT or colon cancer  . PAF (paroxysmal atrial fibrillation) ([place])    a. failed TEE due to inability to pass probe into the esophagus in March 2013. b. On Coumadin. Was in NSR 08/2013 admission.  . Premature atrial contractions   . Premature ventricular contractions   . SBO (small bowel obstruction) 2005  . Type II diabetes mellitus ([place])     Past Surgical History:  Procedure Laterality Date  . ABDOMINAL MASS RESECTION  Mass near mesentery  . APPENDECTOMY     "took out during colon surgery"  . CARDIAC CATHETERIZATION  08/03/10  . CARDIAC CATHETERIZATION  08/09/10   LAD 30, stent OK, CFX 40, RCA < 20  . CATARACT EXTRACTION Bilateral   . CHOLECYSTECTOMY  2011  . COLON SURGERY    . CORONARY ANGIOPLASTY WITH STENT PLACEMENT  08/05/10   DES to the LAD  . CYSTOSCOPY     "with removal of kidney stone in office"  . CYSTOSCOPY WITH RETROGRADE PYELOGRAM, URETEROSCOPY AND STENT PLACEMENT Bilateral 05/18/2013   Procedure: CYSTOSCOPY WITH BILATERAL RETROGRADE PYELOGRAM, LEFT URETEROSCOPY AND LEFT STENT PLACEMENT;  Surgeon: Alexis Frock, MD;  Location: [facility];  Service:  Urology;  Laterality: Bilateral;  . LEFT HEART CATHETERIZATION WITH CORONARY ANGIOGRAM N/A 08/15/2013   Procedure: LEFT HEART CATHETERIZATION WITH CORONARY ANGIOGRAM;  Surgeon: Peter M Martinique, MD;  Location: [facility] CATH LAB;  Service: Cardiovascular;  Laterality: N/A;  . RIGHT COLECTOMY    . TONSILLECTOMY      Current Medications: Prior to Admission medications   Medication Sig Start Date End Date Taking? Authorizing Provider  atorvastatin (LIPITOR) 40 MG tablet TAKE ONE TABLET BY MOUTH ONCE DAILY AT  6  PM 01/16/16   Thayer Headings, MD  carvedilol (COREG) 12.5 MG tablet Take 1 tablet (12.5 mg total) by mouth 2 (two) times daily with a meal. 11/04/15   Clanford Marisa Hua, MD  Cholecalciferol (VITAMIN D-3 PO) Take 1 tablet by mouth daily.     Historical Provider, MD  donepezil (ARICEPT) 5 MG tablet Take 5 mg by mouth at bedtime.  03/16/14   Historical Provider, MD  finasteride (PROSCAR) 5 MG tablet Take 5 mg by mouth at bedtime.  04/01/14   Historical Provider, MD  FLUZONE HIGH-DOSE 0.5 ML SUSY Inject as directed once. 01/21/16   Historical Provider, MD  glimepiride (AMARYL) 4 MG tablet Take 4 mg by mouth daily after breakfast.     Historical Provider, MD  isosorbide mononitrate (IMDUR) 60 MG 24 hr tablet Take 1 tablet (60 mg total) by mouth daily. 11/04/15   Clanford Marisa Hua, MD  linagliptin (TRADJENTA) 5 MG TABS tablet Take 1 tablet (5 mg total) by mouth daily. For blood sugar 11/04/15   Clanford Marisa Hua, MD  lisinopril (PRINIVIL,ZESTRIL) 5 MG tablet Take 5 mg by mouth daily. 01/27/15   Historical Provider, MD  loratadine (CLARITIN) 10 MG tablet Take 10 mg by mouth daily as needed for allergies.    Historical Provider, MD  nitroGLYCERIN (NITROSTAT) 0.4 MG SL tablet Place 1 tablet (0.4 mg total) under the tongue every 5 (five) minutes as needed for chest pain. 07/30/15   Thayer Headings, MD  omeprazole (PRILOSEC) 20 MG capsule Take 20 mg by mouth daily.    Historical Provider, MD  Polyethyl  Glycol-Propyl Glycol (SYSTANE OP) Place 1 drop into both eyes as directed. 2-4 times a day    Historical Provider, MD  ranolazine (RANEXA) 500 MG 12 hr tablet Take 1 tablet (500 mg total) by mouth daily. 01/19/16   Thayer Headings, MD  warfarin (COUMADIN) 4 MG tablet Take 0.5-1 tablets (2-4 mg total) by mouth daily. Take 2 mg on Tues / Thurs ** Take 4 mg on Sun / Mon / Wed / Fri / Sat  START ON 09/18/14-Wednesday-MAKE SURE YOU GO TO YOUR PRIMARY DOCTOR'S OFFICE FOR CBC/INR CHECK WITHIN 1 WEEK Patient taking differently: Take 4 mg by mouth daily.  09/18/14   Shanker Jerilynn Mages  Ghimire, MD    Allergies:   Metformin and related   Social History   Social History  . Marital status: Single    Spouse name: N/A  . Number of children: 0  . Years of education: N/A   Social History Main Topics  . Smoking status: Former Smoker    Packs/day: 1.00    Years: 30.00    Types: Cigarettes    Quit date: 05/10/1994  . Smokeless tobacco: Never Used  . Alcohol use 1.2 oz/week    2 Shots of liquor per week     Comment: occasionally.  . Drug use: No  . Sexual activity: No   Other Topics Concern  . None   Social History Narrative   Lives alone.       Family History:  The patient's family history includes Cancer in his mother; Heart attack (age of onset: 52) in his brother; Ulcers (age of onset: 52) in his father.   ROS:   Please see the history of present illness.    ROS All other systems reviewed and are negative.   PHYSICAL EXAM:   VS:  BP (!) 160/60   Pulse 82   Ht 5\' 9"  (1.753 m)   Wt 153 lb 6.4 oz (69.6 kg)   SpO2 93%   BMI 22.65 kg/m    GEN: Well nourished, well developed, in no acute distress  HEENT: normal  Neck: no JVD, carotid bruits, or masses Cardiac: RRR; no murmurs, rubs, or gallops,no edema  Respiratory:  clear to auscultation bilaterally, normal work of breathing GI: soft, nontender, nondistended, + BS MS: no deformity or atrophy  Skin: warm and dry, no rash Neuro:  Alert and  Oriented x 3, Strength and sensation are intact Psych: euthymic mood, full affect  Wt Readings from Last 3 Encounters:  05/19/16 153 lb 6.4 oz (69.6 kg)  02/23/16 155 lb (70.3 kg)  11/24/15 152 lb 6.4 oz (69.1 kg)      Studies/Labs Reviewed:   EKG:  EKG is ordered today.  The ekg ordered today demonstrates Normal sinus rhythm at rate of 82 bpm with PACs and PVCs. Chronic RBBB. Recent Labs: 08/10/2015: B Natriuretic Peptide 223.9 11/03/2015: TSH 1.727 11/04/2015: BUN 14; Creatinine, Ser 1.10; Hemoglobin 12.3; Platelets 155; Potassium 4.4; Sodium 137   Lipid Panel    Component Value Date/Time   CHOL 101 11/03/2015 1908   TRIG 68 11/03/2015 1908   HDL 37 (L) 11/03/2015 1908   CHOLHDL 2.7 11/03/2015 1908   VLDL 14 11/03/2015 1908   LDLCALC 50 11/03/2015 1908    Additional studies/ records that were reviewed today include:   Echocardiogram: 11/14/15 Study Conclusions  - Left ventricle: The cavity size was normal. Systolic function was   normal. Wall motion was normal; there were no regional wall   motion abnormalities. Doppler parameters are consistent with   abnormal left ventricular relaxation (grade 1 diastolic   dysfunction). - Aortic valve: There was mild stenosis. - Right atrium: ? RA band cor triatriatum not clinically   significant - Atrial septum: No defect or patent foramen ovale was identified.  Carotid doppler 11/04/15 Summary: Bilateral - 1% to 39% ICA stenosis. Vertebral artery flow was antegrade.  Cardiac Catheterization: 08/15/13            Coronary angiography: Coronary dominance: right  Left mainstem: Normal.   Left anterior descending (LAD): The stent in the LAD is widely patent. There is 30% disease in the mid and distal vessel. The first  diagonal is small and has a 80% stenosis proximally.  Left circumflex (LCx): Mild disease less than 20%.  Right coronary artery (RCA): Very large dominant vessel with mild irregularities. There is focal 60%  stenosis in the ostium of the PDA.  Left ventriculography: Left ventricular systolic function is normal, LVEF is estimated at 55-65%, there is no significant mitral regurgitation   Final Conclusions:   1. Single vessel obstructive CAD involving a small diagonal branch. The LAD stent is patent. 2. Normal LV function.   Recommendations: Continue medical therapy. Will resume coumadin per pharmacy.    ASSESSMENT & PLAN:    1. Active unstable angina with hx of CAD - Recently requiring multiple doses of sublingual nitroglycerin with resolution. In clinic he required 2 SL nitro for chest tightness. Pain completley resolved after 20 mins. Prodrome of heaviness in his eye prior to chest tightness. ? High  blood pressure at that time. He has not been checking his BP at home. Last cath 08/2013 showed widely patent stent in mLAD, 80% prox D1 (small vessel) with recommendation for medical management, residual 60% ostial PDA, <20% LCx.  - He is also examined by Dr. Burt Knack (DOD). Symptoms is concerning. He will drive him self to the emergency room for further workup. He is chest pain-free with BP of 130/80 when he left the clinic. Transported to his car by staff.   2. HTN - AT presentation his BP is 160/60. Will need close follow up.   3. HLD - 11/03/2015: Cholesterol 101; HDL 37; LDL Cholesterol 50; Triglycerides 68; VLDL 14  - Continue statin.   4. PAF - Sinus rhythm today. On coumadin . Hx of prior lower GI bleed. ? INR check routinely.   I have spent over 45 minutes, face-to-face with the patient and over 50% was spent in counseling and/or coordination of care. This includes review of prior records, care discussion with DOD (Dr. Burt Knack), developing & discussing different plans, discussion of disease and its complications, life style changes and need of further evaluation.   Medication Adjustments/Labs and Tests Ordered: Current medicines are reviewed at length with the patient today.   Concerns regarding medicines are outlined above.  Medication changes, Labs and Tests ordered today are listed in the Patient Instructions below. There are no Patient Instructions on file for this visit.   Jarrett Soho, [place]  05/19/2016 12:12 PM    [address] [place], [place],   [address] Phone: [phone]; Fax: [phone]

## 2016-05-19 NOTE — ED Notes (Signed)
Pt requesting update. Dr. Maryan Rued made aware. PT updated that cardiology will be seeing him and most likely admit. Explained to pt we are just waiting for consult at this time.

## 2016-05-19 NOTE — ED Triage Notes (Signed)
Pt reports chest tightness ongoing for weeks with sob. Denies leg swelling or recent cough. Sent here by pcp today, pt has cardiac hx. ekg done. Airway intact.

## 2016-05-19 NOTE — ED Notes (Signed)
Pt sister on phone with me at this time questioning why no one had been in to update pt or feed him. I explained to pt family that myself and several other nurses had been in multiple times to update pt; however, not able to tell him when cardiology would be in to see him. I explained to her that food was offered and pt stated " I don't want that, i just want to leave".   Pt does seem to be more confused now than during initial encounter at 1900. Pt A&Ox 4 but becomes forgetful.

## 2016-05-19 NOTE — ED Provider Notes (Signed)
[name] DEPT Provider Note   CSN: [id number] Arrival date & time: 05/19/16  1218     History   Chief Complaint Chief Complaint  Patient presents with  . Chest Pain    HPI PALADIN Blackwell is a 83 y.o. male.  Patient is a 83 year old male with a history of coronary artery disease with drug-eluting stent in the LAD most recently had cardiac catheterization in 2015 founding widely patent stent, 80% proximal D1 stenosis and 60% ostial PDA presenting today from cardiology clinic with chest discomfort and unstable angina. Patient reported for the last few weeks he has had multiple episodes of chest tightness requiring him to take nitroglycerin. He normally takes Ranexa but had not had to take nitroglycerin for some time. In the last 2 weeks he's taken it multiple times. Usually takes 2-3 nitroglycerin to make the pain go away. He has mild shortness of breath with these events but denies any diaphoresis, nausea, vomiting. He states before the tightness starts he gets a heaviness in his eyes. He denies any cough, fever, medication changes, abdominal pain or swelling. When he was in the office today he was having 8 out of 10 pain. He took 2 nitroglycerin while in the office with resolution of his symptoms. He then drove himself to the emergency room instructed.   The history is provided by the patient and medical records.  Chest Pain   This is a recurrent problem.    Past Medical History:  Diagnosis Date  . Aortic stenosis    a. Mild-mod by echo 2013.  . Barrett esophagus   . Chronic anticoagulation   . Chronic systolic CHF (congestive heart failure) (HCC)    a. EF previously 35-40%. b. improved to 55-65% by cath 08/2013.  [name] Colon cancer ([place])    a. 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 b.  "graduated" from their practice 06/2013, remains free of any new disease per those notes.  . Coronary artery disease    a. s/p PCI w/ DES to mLAD 08/05/10. b. NSTEMI 08/2013 (mildly  elev trop) - cath showing widely patent stent, 80% prox D1 (small vessel) with recommendation for medical management, residual 60% ostial PDA, <20% LCx, LVEF 55-65%.   . Depression   . Gallstone pancreatitis 2011   a. s/p chole.  [name] GERD (gastroesophageal reflux disease)   . Hyperlipidemia   . Hypertension   . Macrocytic anemia 06/25/2011  . Myelodysplastic disease ([place])    a. Suspected low-grade  myelodysplastic disease per onc notes.  . Other pancytopenia ([place]) 06/18/2014   Mild, fluctuating, likely med related. Rule out early MDS from prior chemo/RT or colon cancer  . PAF (paroxysmal atrial fibrillation) ([place])    a. failed TEE due to inability to pass probe into the esophagus in March 2013. b. On Coumadin. Was in NSR 08/2013 admission.  . Premature atrial contractions   . Premature ventricular contractions   . SBO (small bowel obstruction) 2005  . Type II diabetes mellitus [facility])     Patient Active Problem List   Diagnosis Date Noted  . Near syncope 11/03/2015  . Chest discomfort 11/03/2015  . Transient Hypotension 11/03/2015  . Generalized weakness 11/03/2015  . Supratherapeutic INR 11/03/2015  . Hyperglycemia, unspecified 11/03/2015  . Anemia, unspecified 11/03/2015  . Chronic anticoagulation 11/03/2015  . Altered awareness, transient 11/03/2015  . Thrombocytopenia ([place]) 08/10/2015  . Non-insulin dependent type 2 diabetes mellitus ([place])   . Pain in the chest   . History of  colon cancer, stage III 09/11/2014  . BRBPR (bright red blood per rectum) 09/11/2014  . Other pancytopenia ([place]) 06/18/2014  . Chronic systolic CHF (congestive heart failure) ([place]) 06/07/2014  . Chest pain 12/14/2012  . Colon cancer metastasized to mesenteric lymph nodes ([place]) 06/16/2012  . Paroxysmal atrial fibrillation ([place]) 07/20/2011  . Cardiomyopathy ([place]) 07/20/2011  . SOB (shortness of breath) 07/17/2011  . Macrocytic anemia 06/25/2011  . Bruising 09/18/2010  . Coronary artery disease 08/13/2010    . Hypertension   . Hyperlipidemia     Past Surgical History:  Procedure Laterality Date  . ABDOMINAL MASS RESECTION     Mass near mesentery  . APPENDECTOMY     "took out during colon surgery"  . CARDIAC CATHETERIZATION  08/03/10  . CARDIAC CATHETERIZATION  08/09/10   LAD 30, stent OK, CFX 40, RCA < 20  . CATARACT EXTRACTION Bilateral   . CHOLECYSTECTOMY  2011  . COLON SURGERY    . CORONARY ANGIOPLASTY WITH STENT PLACEMENT  08/05/10   DES to the LAD  . CYSTOSCOPY     "with removal of kidney stone in office"  . CYSTOSCOPY WITH RETROGRADE PYELOGRAM, URETEROSCOPY AND STENT PLACEMENT Bilateral 05/18/2013   Procedure: CYSTOSCOPY WITH BILATERAL RETROGRADE PYELOGRAM, LEFT URETEROSCOPY AND LEFT STENT PLACEMENT;  Surgeon: Alexis Frock, MD;  Location: [facility] ORS;  Service: Urology;  Laterality: Bilateral;  . LEFT HEART CATHETERIZATION WITH CORONARY ANGIOGRAM N/A 08/15/2013   Procedure: LEFT HEART CATHETERIZATION WITH CORONARY ANGIOGRAM;  Surgeon: Peter M Martinique, MD;  Location: [facility] CATH LAB;  Service: Cardiovascular;  Laterality: N/A;  . RIGHT COLECTOMY    . TONSILLECTOMY         Home Medications    Prior to Admission medications   Medication Sig Start Date End Date Taking? Authorizing Provider  atorvastatin (LIPITOR) 40 MG tablet TAKE ONE TABLET BY MOUTH ONCE DAILY AT  6  PM 01/16/16   Thayer Headings, MD  carvedilol (COREG) 12.5 MG tablet Take 1 tablet (12.5 mg total) by mouth 2 (two) times daily with a meal. 11/04/15   Clanford Marisa Hua, MD  Cholecalciferol (VITAMIN D-3 PO) Take 1 tablet by mouth daily.     Historical Provider, MD  donepezil (ARICEPT) 10 MG tablet Take 10 mg by mouth daily. 05/04/16   Historical Provider, MD  donepezil (ARICEPT) 5 MG tablet Take 5 mg by mouth at bedtime.  03/16/14   Historical Provider, MD  finasteride (PROSCAR) 5 MG tablet Take 5 mg by mouth at bedtime.  04/01/14   Historical Provider, MD  FLUZONE HIGH-DOSE 0.5 ML SUSY Inject as directed once. 01/21/16    Historical Provider, MD  glimepiride (AMARYL) 4 MG tablet Take 4 mg by mouth daily after breakfast.     Historical Provider, MD  isosorbide mononitrate (IMDUR) 60 MG 24 hr tablet Take 1 tablet (60 mg total) by mouth daily. 11/04/15   Clanford Marisa Hua, MD  linagliptin (TRADJENTA) 5 MG TABS tablet Take 1 tablet (5 mg total) by mouth daily. For blood sugar 11/04/15   Clanford Marisa Hua, MD  lisinopril (PRINIVIL,ZESTRIL) 5 MG tablet Take 5 mg by mouth daily. 01/27/15   Historical Provider, MD  loratadine (CLARITIN) 10 MG tablet Take 10 mg by mouth daily as needed for allergies.    Historical Provider, MD  nitroGLYCERIN (NITROSTAT) 0.4 MG SL tablet Place 1 tablet (0.4 mg total) under the tongue every 5 (five) minutes as needed for chest pain. 07/30/15   Thayer Headings, MD  omeprazole ([place])  20 MG capsule Take 20 mg by mouth daily.    Historical Provider, MD  Polyethyl Glycol-Propyl Glycol (SYSTANE OP) Place 1 drop into both eyes as directed. 2-4 times a day    Historical Provider, MD  RANEXA 1000 MG SR tablet Take 1,000 mg by mouth daily. 05/10/16   Historical Provider, MD  warfarin (COUMADIN) 4 MG tablet Take 0.5-1 tablets (2-4 mg total) by mouth daily. Take 2 mg on Tues / Thurs ** Take 4 mg on Sun / Mon / Wed / Fri / Sat  START ON 09/18/14-Wednesday-MAKE SURE YOU GO TO YOUR PRIMARY DOCTOR'S OFFICE FOR CBC/INR CHECK WITHIN 1 WEEK Patient taking differently: Take 4 mg by mouth daily.  09/18/14   Shanker Kristeen Mans, MD    Family History Family History  Problem Relation Age of Onset  . Cancer Mother   . Ulcers Father 53  . Heart attack Brother 71    Social History Social History  Substance Use Topics  . Smoking status: Former Smoker    Packs/day: 1.00    Years: 30.00    Types: Cigarettes    Quit date: 05/10/1994  . Smokeless tobacco: Never Used  . Alcohol use 1.2 oz/week    2 Shots of liquor per week     Comment: occasionally.     Allergies   Metformin and related   Review of  Systems Review of Systems  Cardiovascular: Positive for chest pain.  All other systems reviewed and are negative.    Physical Exam Updated Vital Signs BP 165/81 (BP Location: Right Arm)   Pulse 69   Temp 98.1 F (36.7 C) (Oral)   Resp 15   Ht 5\' 9"  (1.753 m)   Wt 153 lb (69.4 kg)   SpO2 97%   BMI 22.59 kg/m   Physical Exam  Constitutional: He appears well-developed and well-nourished. No distress.  HENT:  Head: Normocephalic and atraumatic.  Mouth/Throat: Oropharynx is clear and moist.  Eyes: Conjunctivae and EOM are normal. Pupils are equal, round, and reactive to light.  Neck: Normal range of motion. Neck supple.  Cardiovascular: Normal rate, regular rhythm and intact distal pulses.   No murmur heard. Pulmonary/Chest: Effort normal and breath sounds normal. No respiratory distress. He has no wheezes. He has no rales.  Abdominal: Soft. He exhibits no distension. There is no tenderness. There is no rebound and no guarding.  Musculoskeletal: Normal range of motion. He exhibits no edema or tenderness.  Neurological: He is alert.  Oriented to person and place but has a little difficulty with time  Skin: Skin is warm and dry. No rash noted. No erythema.  Psychiatric: He has a normal mood and affect. His behavior is normal.  Nursing note and vitals reviewed.    ED Treatments / Results  Labs (all labs ordered are listed, but only abnormal results are displayed) Labs Reviewed  BASIC METABOLIC PANEL - Abnormal; Notable for the following:       Result Value   Glucose, Bld 243 (*)    GFR calc non Af Amer 51 (*)    GFR calc Af Amer 60 (*)    All other components within normal limits  CBC - Abnormal; Notable for the following:    RBC 3.92 (*)    MCV 104.1 (*)    MCH 34.9 (*)    All other components within normal limits  Alphonzo Lemmings, ED    EKG  EKG Interpretation  Date/Time:  Wednesday May 19 2016 12:27:02  EST Ventricular Rate:  84 PR  Interval:  204 QRS Duration: 138 QT Interval:  418 QTC Calculation: 493 R Axis:   122 Text Interpretation:  Sinus rhythm with frequent and consecutive Premature ventricular complexes and Premature atrial complexes Right bundle branch block No significant change since last tracing Confirmed by Maryan Rued  MD, Loree Fee ([id number]) on 05/19/2016 4:30:42 PM       Radiology Dg Chest 2 View  Result Date: 05/19/2016 CLINICAL DATA:  83 year old male with progressive intermittent chest pain for few weeks. Initial encounter. EXAM: CHEST  2 VIEW COMPARISON:  Chest radiograph 11/03/2015 and earlier. FINDINGS: Stable lung volumes. Chronic perihilar and peripheral increased pulmonary interstitial opacity worse on the right. No superimposed pneumothorax, pulmonary edema, pleural effusion or acute pulmonary opacity. Stable cardiac size and mediastinal contours. Calcified aortic atherosclerosis. Mild tortuosity of the aorta. Visualized tracheal air column is within normal limits. No acute osseous abnormality identified. Stable cholecystectomy clips. Negative visible bowel gas pattern. IMPRESSION: 1. Stable chronic lung disease. No superimposed acute findings are identified. 2. Calcified aortic atherosclerosis. Electronically Signed   By: Genevie Ann M.D.   On: 05/19/2016 12:56    Procedures Procedures (including critical care time)  Medications Ordered in ED Medications - No data to display   Initial Impression / Assessment and Plan / ED Course  I have reviewed the triage vital signs and the nursing notes.  Pertinent labs & imaging results that were available during my care of the patient were reviewed by me and considered in my medical decision making (see chart for details).  Clinical Course    Patient presenting from cardiology office with concerning symptoms of chest tightness that's worsened over the last 2 weeks requiring him to take nitroglycerin. He was seen in the cardiology office today and required 2  nitroglycerin to make his chest tightness resolved. He currently is pain-free and has no symptoms. Initial labs without acute findings. EKG unchanged other than frequent PVCs. Patient has not taken his medications today.  Cardiology consult that for further care.  He has no infectious symptoms concerning for pneumonia he has no evidence of fluid overload and low suspicion for PE.  Final Clinical Impressions(s) / ED Diagnoses   Final diagnoses:  Unstable angina [facility])    New Prescriptions New Prescriptions   No medications on file     Blanchie Dessert, MD 05/19/16 1758

## 2016-05-19 NOTE — ED Notes (Signed)
Cardiology at bedside now visiting with pt

## 2016-05-19 NOTE — ED Notes (Signed)
Pt voicing concern about being not seeing cardiologist yet. I explained to pt the cardiologist was aware of his needs and would be in to see him as soon as possible.

## 2016-05-19 NOTE — ED Notes (Signed)
This nurse secretary has observed the patient's increased confusion for the last (give or take) 5 hours.Patient has called out for clarity on what is going on, frustrated that he's been waiting all day, frustrated that he can't eat.  He states that I am not trying to help and that he will call the [facility] [facility] to tell them about this "treatment."  Patient was friendly and aware of the situation when he was brought to the room at first.

## 2016-05-20 ENCOUNTER — Encounter (HOSPITAL_COMMUNITY)
Admission: EM | Disposition: A | Discharge status: Home / Self Care | Payer: Self-pay | Source: Home / Self Care | Attending: Emergency Medicine

## 2016-05-20 DIAGNOSIS — E440 Moderate protein-calorie malnutrition: Secondary | ICD-10-CM | POA: Insufficient documentation

## 2016-05-20 DIAGNOSIS — I214 Non-ST elevation (NSTEMI) myocardial infarction: Secondary | ICD-10-CM

## 2016-05-20 DIAGNOSIS — I25110 Atherosclerotic heart disease of native coronary artery with unstable angina pectoris: Secondary | ICD-10-CM | POA: Diagnosis not present

## 2016-05-20 DIAGNOSIS — I480 Paroxysmal atrial fibrillation: Secondary | ICD-10-CM | POA: Diagnosis not present

## 2016-05-20 DIAGNOSIS — I119 Hypertensive heart disease without heart failure: Secondary | ICD-10-CM

## 2016-05-20 DIAGNOSIS — I5042 Chronic combined systolic (congestive) and diastolic (congestive) heart failure: Secondary | ICD-10-CM

## 2016-05-20 DIAGNOSIS — I5032 Chronic diastolic (congestive) heart failure: Secondary | ICD-10-CM

## 2016-05-20 DIAGNOSIS — E7800 Pure hypercholesterolemia, unspecified: Secondary | ICD-10-CM

## 2016-05-20 DIAGNOSIS — I200 Unstable angina: Secondary | ICD-10-CM | POA: Diagnosis not present

## 2016-05-20 HISTORY — PX: CARDIAC CATHETERIZATION: SHX172

## 2016-05-20 LAB — LIPID PANEL
CHOL/HDL RATIO: 2.5 ratio
Cholesterol: 122 mg/dL (ref 0–200)
HDL: 48 mg/dL (ref >40)
LDL CALC: 60 mg/dL (ref 0–99)
Triglycerides: 70 mg/dL (ref <150)
VLDL: 14 mg/dL (ref 0–40)

## 2016-05-20 LAB — CBC
HEMATOCRIT: 38.6 % — AB (ref 39.0–52.0)
Hemoglobin: 13 g/dL (ref 13.0–17.0)
MCH: 34.2 pg — ABNORMAL HIGH (ref 26.0–34.0)
MCHC: 33.7 g/dL (ref 30.0–36.0)
MCV: 101.6 fL — ABNORMAL HIGH (ref 78.0–100.0)
PLATELETS: 152 10*3/uL (ref 150–400)
RBC: 3.8 MIL/uL — AB (ref 4.22–5.81)
RDW: 13.5 % (ref 11.5–15.5)
WBC: 5.2 10*3/uL (ref 4.0–10.5)

## 2016-05-20 LAB — BASIC METABOLIC PANEL
Anion gap: 6 (ref 5–15)
BUN: 14 mg/dL (ref 6–20)
CO2: 24 mmol/L (ref 22–32)
Calcium: 8.8 mg/dL — ABNORMAL LOW (ref 8.9–10.3)
Chloride: 106 mmol/L (ref 101–111)
Creatinine, Ser: 0.98 mg/dL (ref 0.61–1.24)
GFR calc Af Amer: >60 mL/min (ref >60)
GLUCOSE: 125 mg/dL — AB (ref 65–99)
Potassium: 3.7 mmol/L (ref 3.5–5.1)
Sodium: 136 mmol/L (ref 135–145)

## 2016-05-20 LAB — GLUCOSE, CAPILLARY
GLUCOSE-CAPILLARY: 182 mg/dL — AB (ref 65–99)
GLUCOSE-CAPILLARY: 132 mg/dL — AB (ref 65–99)
Glucose-Capillary: 134 mg/dL — ABNORMAL HIGH (ref 65–99)
Glucose-Capillary: 179 mg/dL — ABNORMAL HIGH (ref 65–99)

## 2016-05-20 LAB — PROTIME-INR
INR: 1.45
Prothrombin Time: 17.8 seconds — ABNORMAL HIGH (ref 11.4–15.2)

## 2016-05-20 LAB — TROPONIN I
TROPONIN I: 0.05 ng/mL — AB (ref <0.03)
TROPONIN I: 0.04 ng/mL — AB (ref <0.03)
Troponin I: 0.05 ng/mL (ref <0.03)

## 2016-05-20 LAB — TSH: TSH: 2.691 u[IU]/mL (ref 0.350–4.500)

## 2016-05-20 SURGERY — LEFT HEART CATH AND CORONARY ANGIOGRAPHY
Anesthesia: LOCAL

## 2016-05-20 MED ORDER — LIDOCAINE HCL (PF) 1 % IJ SOLN
INTRAMUSCULAR | Status: DC | PRN
  Administered 2016-05-20: 15 mL

## 2016-05-20 MED ORDER — SODIUM CHLORIDE 0.9% FLUSH
3.0000 mL | Freq: Two times a day (BID) | INTRAVENOUS | Status: DC
  Administered 2016-05-20 – 2016-05-21 (×2): 3 mL via INTRAVENOUS

## 2016-05-20 MED ORDER — LINAGLIPTIN 5 MG PO TABS
5.0000 mg | ORAL_TABLET | Freq: Every day | ORAL | Status: DC
  Administered 2016-05-21: 5 mg via ORAL
  Filled 2016-05-20: qty 1

## 2016-05-20 MED ORDER — ACETAMINOPHEN 325 MG PO TABS: 650.0000 mg | ORAL_TABLET | ORAL | Status: DC | PRN

## 2016-05-20 MED ORDER — CARVEDILOL 25 MG PO TABS: 25.0000 mg | ORAL_TABLET | Freq: Two times a day (BID) | ORAL | Status: DC

## 2016-05-20 MED ORDER — IOPAMIDOL (ISOVUE-370) INJECTION 76%
INTRAVENOUS | Status: AC
  Filled 2016-05-20: qty 100

## 2016-05-20 MED ORDER — MORPHINE SULFATE (PF) 4 MG/ML IV SOLN: 2.0000 mg | INTRAVENOUS | Status: DC | PRN

## 2016-05-20 MED ORDER — CARVEDILOL 12.5 MG PO TABS
12.5000 mg | ORAL_TABLET | Freq: Two times a day (BID) | ORAL | Status: DC
  Administered 2016-05-20 – 2016-05-21 (×3): 12.5 mg via ORAL
  Filled 2016-05-20 (×3): qty 1

## 2016-05-20 MED ORDER — DONEPEZIL HCL 10 MG PO TABS
10.0000 mg | ORAL_TABLET | Freq: Every day | ORAL | Status: DC
  Administered 2016-05-20 (×2): 10 mg via ORAL
  Filled 2016-05-20 (×2): qty 1

## 2016-05-20 MED ORDER — ISOSORBIDE MONONITRATE ER 60 MG PO TB24
120.0000 mg | ORAL_TABLET | Freq: Every day | ORAL | Status: DC
  Administered 2016-05-20 – 2016-05-21 (×2): 120 mg via ORAL
  Filled 2016-05-20 (×2): qty 2

## 2016-05-20 MED ORDER — WARFARIN SODIUM 2 MG PO TABS
2.0000 mg | ORAL_TABLET | ORAL | Status: DC
  Administered 2016-05-20: 2 mg via ORAL
  Filled 2016-05-20: qty 1

## 2016-05-20 MED ORDER — HEPARIN (PORCINE) IN NACL 2-0.9 UNIT/ML-% IJ SOLN
INTRAMUSCULAR | Status: DC | PRN
  Administered 2016-05-20: 1000 mL

## 2016-05-20 MED ORDER — SODIUM CHLORIDE 0.9 % IV SOLN: 250.0000 mL | INTRAVENOUS | Status: DC | PRN

## 2016-05-20 MED ORDER — NITROGLYCERIN 0.4 MG SL SUBL: 0.4000 mg | SUBLINGUAL_TABLET | SUBLINGUAL | Status: DC | PRN

## 2016-05-20 MED ORDER — SODIUM CHLORIDE 0.9% FLUSH: 3.0000 mL | INTRAVENOUS | Status: DC | PRN

## 2016-05-20 MED ORDER — SODIUM CHLORIDE 0.9 % IV SOLN
INTRAVENOUS | Status: AC
  Administered 2016-05-20: 18:00:00 via INTRAVENOUS

## 2016-05-20 MED ORDER — HEPARIN (PORCINE) IN NACL 2-0.9 UNIT/ML-% IJ SOLN
INTRAMUSCULAR | Status: AC
  Filled 2016-05-20: qty 1500

## 2016-05-20 MED ORDER — FINASTERIDE 5 MG PO TABS
5.0000 mg | ORAL_TABLET | Freq: Every day | ORAL | Status: DC
  Administered 2016-05-20 (×2): 5 mg via ORAL
  Filled 2016-05-20 (×2): qty 1

## 2016-05-20 MED ORDER — RANOLAZINE ER 500 MG PO TB12
1000.0000 mg | ORAL_TABLET | Freq: Two times a day (BID) | ORAL | Status: DC
  Administered 2016-05-20 – 2016-05-21 (×3): 1000 mg via ORAL
  Filled 2016-05-20 (×4): qty 2

## 2016-05-20 MED ORDER — IOPAMIDOL (ISOVUE-370) INJECTION 76%
INTRAVENOUS | Status: DC | PRN
  Administered 2016-05-20: 50 mL via INTRAVENOUS

## 2016-05-20 MED ORDER — WARFARIN - PHYSICIAN DOSING INPATIENT: Freq: Every day | Status: DC

## 2016-05-20 MED ORDER — ATORVASTATIN CALCIUM 40 MG PO TABS
40.0000 mg | ORAL_TABLET | Freq: Every day | ORAL | Status: DC
  Administered 2016-05-20: 40 mg via ORAL
  Filled 2016-05-20: qty 1

## 2016-05-20 MED ORDER — LISINOPRIL 5 MG PO TABS
5.0000 mg | ORAL_TABLET | Freq: Every day | ORAL | Status: DC
  Administered 2016-05-21: 5 mg via ORAL
  Filled 2016-05-20: qty 1

## 2016-05-20 MED ORDER — WARFARIN SODIUM 2 MG PO TABS: 4.0000 mg | ORAL_TABLET | Freq: Every day | ORAL | Status: DC

## 2016-05-20 MED ORDER — WARFARIN SODIUM 2 MG PO TABS: 4.0000 mg | ORAL_TABLET | ORAL | Status: DC

## 2016-05-20 MED ORDER — ONDANSETRON HCL 4 MG/2ML IJ SOLN: 4.0000 mg | Freq: Four times a day (QID) | INTRAMUSCULAR | Status: DC | PRN

## 2016-05-20 MED ORDER — LIDOCAINE HCL (PF) 1 % IJ SOLN
INTRAMUSCULAR | Status: AC
  Filled 2016-05-20: qty 30

## 2016-05-20 SURGICAL SUPPLY — 7 items
CATH INFINITI 5FR MULTPACK ANG (CATHETERS) ×1 IMPLANT
DEVICE CLOSURE MYNXGRIP 5F (Vascular Products) ×1 IMPLANT
KIT HEART LEFT (KITS) ×2 IMPLANT
PACK CARDIAC CATHETERIZATION (CUSTOM PROCEDURE TRAY) ×2 IMPLANT
SHEATH PINNACLE 5F 10CM (SHEATH) ×1 IMPLANT
SYR MEDRAD MARK V 150ML (SYRINGE) ×2 IMPLANT
TRANSDUCER W/STOPCOCK (MISCELLANEOUS) ×2 IMPLANT

## 2016-05-20 NOTE — Progress Notes (Signed)
Patient with no complaints or concerns during 7pm - 7am shift.  Adar Rase, RN 

## 2016-05-20 NOTE — Progress Notes (Signed)
New Admission Note:   Arrival Method: Bed Mental Orientation: AOX4 Telemetry:Box 28 Assessment: Completed Skin: Intact IV: Right arm 22 G Pain:0/10 Tubes: None Safety Measures: Safety Fall Prevention Plan has been discussed  Admission:  [address] [place] Orientation: Patient has been orientated to the room, unit and staff.  Family: None at bedside  Orders to be reviewed and implemented. Will continue to monitor the patient. Call light has been placed within reach and bed alarm has been activated.   Venetia Night, RN Phone: [phone]

## 2016-05-20 NOTE — Progress Notes (Signed)
Initial Nutrition Assessment  DOCUMENTATION CODES:   Non-severe (moderate) malnutrition in context of chronic illness  INTERVENTION:  Diet advancement per MD Add Ensure Enlive BID when diet advanced Multivitamin with minerals daily   NUTRITION DIAGNOSIS:   Malnutrition related to poor appetite as evidenced by energy intake < 75% for > or equal to 1 month, moderate depletions of muscle mass.   GOAL:   Patient will meet greater than or equal to 90% of their needs   MONITOR:   PO intake, Supplement acceptance, Labs, Weight trends, Skin, I & O's  REASON FOR ASSESSMENT:   Malnutrition Screening Tool    ASSESSMENT:   83 year old male with a history of coronary artery disease post drug-eluting stent to the mid LAD in 2012, diabetes, hypertension, hyperlipidemia, carotid bruit, PAF on Coumadin, diastolic heart failure, and a history of lower GI bleeding who presents with worsening fullness in his chest.  Pt states that he has had a decreased appetite for the past few months, eating 2 meals instead of 3. He eats breakfast and lunch, but has no appetite at dinner time. He reports losing 8 lbs in the past few months. He started drinking Boost at night with a small cake. RD discussed the importance of adequate calorie and protein intake as well as the importance of weight maintenance. RD recommended increasing intake of Boost and snacks until weight stabilizes. Pt agreeable. Pt NPO today.  Limited physical exam (due to lab draws) shows moderate muscle wasting and mild fat wasting.   Labs: glucose ranging 134 to 243 mg/dL  Diet Order:  Diet NPO time specified Except for: Sips with Meds  Skin:  Reviewed, no issues  Last BM:  1/10  Height:   Ht Readings from Last 1 Encounters:  05/19/16 5\' 9"  (1.753 m)    Weight:   Wt Readings from Last 1 Encounters:  05/19/16 145 lb (65.8 kg)    Ideal Body Weight:  72.7 kg  BMI:  Body mass index is 21.41 kg/m.  Estimated Nutritional  Needs:   Kcal:  1600-1800  Protein:  80-90 grams  Fluid:  1.6-1.8 L/day  EDUCATION NEEDS:   No education needs identified at this time  [place], CSP, LDN Inpatient Clinical Dietitian Pager: [phone] After Hours Pager: [phone]

## 2016-05-20 NOTE — Progress Notes (Signed)
Pt returns from cathlab, right groin site level 0, pt is c/a/ox3, he did not receive sedation for the procedure. it was tolerated well. Pt expresses hunger, tolerates clear liquids promptly after returning.    Pt as mynx'd closure in place; no intervention necessary as long as site remains zero; per cath lab.   Pt denies discomfort, his vitals are all WNL. Pt to remain on bedrest for 2 hours with mynx'd closure; shorter period than standard protocol per cath lab report.

## 2016-05-20 NOTE — Progress Notes (Signed)
Patient Name: [name] Date of Encounter: 05/20/2016  Primary Cardiologist: Dr. Antonieta Loveless Problem List     Active Problems:   Chest pain    Subjective   No current c/o chest pain. Somewhat difficult historian, seems confused at times.   Inpatient Medications    Scheduled Meds: . atorvastatin  40 mg Oral q1800  . carvedilol  12.5 mg Oral BID WC  . donepezil  10 mg Oral QHS  . finasteride  5 mg Oral QHS  . heparin  5,000 Units Subcutaneous Q8H  . isosorbide mononitrate  120 mg Oral Daily  . linagliptin  5 mg Oral Daily  . ranolazine  1,000 mg Oral BID  . sodium chloride flush  3 mL Intravenous Q12H  . warfarin  2 mg Oral Once per day on Tue Thu  . [START ON 05/21/2016] warfarin  4 mg Oral Once per day on Sun Mon Wed Fri Sat  . Warfarin - Physician Dosing Inpatient   Does not apply q1800   Continuous Infusions: . sodium chloride 70 mL/hr at 05/20/16 0044   PRN Meds: acetaminophen, nitroGLYCERIN, ondansetron (ZOFRAN) IV, sodium chloride flush   Vital Signs    Vitals:   05/19/16 2115 05/19/16 2245 05/19/16 2345 05/20/16 0616  BP: (!) 154/107 (!) 158/105 (!) 155/85 (!) 155/77  Pulse: 82 82 70 82  Resp: 17  18 16   Temp:   97.9 F (36.6 C) 98.2 F (36.8 C)  TempSrc:   Oral Oral  SpO2: 93% 95% 97% 94%  Weight:   145 lb (65.8 kg)   Height:   5\' 9"  (1.753 m)     Intake/Output Summary (Last 24 hours) at 05/20/16 0952 Last data filed at 05/20/16 [id number]  Gross per 24 hour  Intake              441 ml  Output              350 ml  Net               91 ml   Filed Weights   05/19/16 1230 05/19/16 2345  Weight: 153 lb (69.4 kg) 145 lb (65.8 kg)    Physical Exam    GEN: Well nourished, well developed, older WM in no acute distress.  HEENT: Grossly normal.  Neck: Supple, no JVD, carotid bruits, or masses. Cardiac: RRR, no murmurs, rubs, or gallops. No clubbing, cyanosis, edema.  Radials/DP/PT 2+ and equal bilaterally.  Respiratory:  Respirations  regular and unlabored, clear to auscultation bilaterally. GI: Soft, nontender, nondistended, BS + x 4. MS: no deformity or atrophy. Skin: warm and dry, no rash. Neuro:  Strength and sensation are intact. Psych: AAOx3.  Normal affect.  Labs    CBC  Recent Labs  05/19/16 1228 05/20/16 0555  WBC 7.6 5.2  HGB 13.7 13.0  HCT 40.8 38.6*  MCV 104.1* 101.6*  PLT 184 0000000   Basic Metabolic Panel  Recent Labs  05/19/16 1228 05/20/16 0555  NA 140 136  K 4.1 3.7  CL 107 106  CO2 24 24  GLUCOSE 243* 125*  BUN 18 14  CREATININE 1.20 0.98  CALCIUM 9.7 8.8*   Liver Function Tests No results for input(s): AST, ALT, ALKPHOS, BILITOT, PROT, ALBUMIN in the last 72 hours. No results for input(s): LIPASE, AMYLASE in the last 72 hours. Cardiac Enzymes  Recent Labs  05/20/16 0101 05/20/16 0555  TROPONINI 0.05* 0.05*   BNP Invalid input(s): POCBNP D-Dimer  Recent Labs  05/19/16 1228  DDIMER 0.29   Hemoglobin A1C No results for input(s): HGBA1C in the last 72 hours. Fasting Lipid Panel  Recent Labs  05/20/16 0101  CHOL 122  HDL 48  LDLCALC 60  TRIG 70  CHOLHDL 2.5   Thyroid Function Tests  Recent Labs  05/20/16 0200  TSH 2.691    Telemetry    SR with freq PACs, PVCs- Personally Reviewed  ECG    SR with freq PVCs, PACs- Personally Reviewed  Radiology    Dg Chest 2 View  Result Date: 05/19/2016 CLINICAL DATA:  83 year old male with progressive intermittent chest pain for few weeks. Initial encounter. EXAM: CHEST  2 VIEW COMPARISON:  Chest radiograph 11/03/2015 and earlier. FINDINGS: Stable lung volumes. Chronic perihilar and peripheral increased pulmonary interstitial opacity worse on the right. No superimposed pneumothorax, pulmonary edema, pleural effusion or acute pulmonary opacity. Stable cardiac size and mediastinal contours. Calcified aortic atherosclerosis. Mild tortuosity of the aorta. Visualized tracheal air column is within normal limits. No acute  osseous abnormality identified. Stable cholecystectomy clips. Negative visible bowel gas pattern. IMPRESSION: 1. Stable chronic lung disease. No superimposed acute findings are identified. 2. Calcified aortic atherosclerosis. Electronically Signed   By: Genevie Ann M.D.   On: 05/19/2016 12:56    Cardiac Studies     Patient Profile     83 y.o. male CAD s/p DES to mLAD in 2012, PAF, HTN, HLD, DM, left carotid bruit, lower GI bleed  and chronic diastolic CHF who presented to the office for chest pain and reported taking SL intermittently over the past month for chest tightness. He was sent to the ED for further work up, and cardiology admission.  Assessment & Plan    1. Active unstable angina with hx of CAD - Recently requiring multiple doses of sublingual nitroglycerin with resolution per his report. While in clinic yesterday he required 2 SL nitro for chest tightness. Pain completley resolved after 20 mins. --Last cath 08/2013 showed widely patent stent in mLAD, 80% prox D1 (small vessel) with recommendation for medical management, residual 60% ostial PDA, <20% LCx.  --Last saw Dr. [name] on 10/17 when he reported taking intermittent angina, and encouraged to use his SL nitro as needed. Noted hesitancy to place on Plavix given GI bleed in the past and hx of myelodysplastic syndrome. Does have a remote hx of brief GI bleed in 5/16 that resolved, seen by GI with no invasive work up.  -- planned for LHC today. Will discuss with MD whether to proceed with cath today.  2. HTN - Remains elevated. Lisinopril was held on admission, and Imdur increased to 120mg  daily. Planned to restart lisinopril   3. HLD - 11/03/2015: Cholesterol 101; HDL 37; LDL Cholesterol 50; Triglycerides 68; VLDL 14  - Continue statin.   4. PAF - Sinus rhythm with PVCs, PACs on telemetry. On coumadin, though INR is subtherapeutic. States he is compliant with home meds?   5. Elevated Trop: Mild, flat non ACS trend. Currently  without chest tightness, blood pressure was elevated on admission. Could be from demand ischemia.   Signed, Reino Bellis, NP  05/20/2016, 9:52 AM

## 2016-05-20 NOTE — Interval H&P Note (Signed)
Cath Lab Visit (complete for each Cath Lab visit)  Clinical Evaluation Leading to the Procedure:   ACS: Yes.    Non-ACS:    Anginal Classification: CCS III  Anti-ischemic medical therapy: Minimal Therapy (1 class of medications)  Non-Invasive Test Results: No non-invasive testing performed  Prior CABG: No previous CABG      History and Physical Interval Note:  05/20/2016 3:23 PM  [name]  has presented today for surgery, with the diagnosis of unstable angina  The various methods of treatment have been discussed with the patient and family. After consideration of risks, benefits and other options for treatment, the patient has consented to  Procedure(s): Left Heart Cath and Coronary Angiography (N/A) as a surgical intervention .  The patient's history has been reviewed, patient examined, no change in status, stable for surgery.  I have reviewed the patient's chart and labs.  Questions were answered to the patient's satisfaction.     Quay Burow

## 2016-05-20 NOTE — H&P (View-Only) (Signed)
Patient Name: [name] Date of Encounter: 05/20/2016  Primary Cardiologist: Dr. Antonieta Loveless Problem List     Active Problems:   Chest pain    Subjective   No current c/o chest pain. Somewhat difficult historian, seems confused at times.   Inpatient Medications    Scheduled Meds: . atorvastatin  40 mg Oral q1800  . carvedilol  12.5 mg Oral BID WC  . donepezil  10 mg Oral QHS  . finasteride  5 mg Oral QHS  . heparin  5,000 Units Subcutaneous Q8H  . isosorbide mononitrate  120 mg Oral Daily  . linagliptin  5 mg Oral Daily  . ranolazine  1,000 mg Oral BID  . sodium chloride flush  3 mL Intravenous Q12H  . warfarin  2 mg Oral Once per day on Tue Thu  . [START ON 05/21/2016] warfarin  4 mg Oral Once per day on Sun Mon Wed Fri Sat  . Warfarin - Physician Dosing Inpatient   Does not apply q1800   Continuous Infusions: . sodium chloride 70 mL/hr at 05/20/16 0044   PRN Meds: acetaminophen, nitroGLYCERIN, ondansetron (ZOFRAN) IV, sodium chloride flush   Vital Signs    Vitals:   05/19/16 2115 05/19/16 2245 05/19/16 2345 05/20/16 0616  BP: (!) 154/107 (!) 158/105 (!) 155/85 (!) 155/77  Pulse: 82 82 70 82  Resp: 17  18 16   Temp:   97.9 F (36.6 C) 98.2 F (36.8 C)  TempSrc:   Oral Oral  SpO2: 93% 95% 97% 94%  Weight:   145 lb (65.8 kg)   Height:   5\' 9"  (1.753 m)     Intake/Output Summary (Last 24 hours) at 05/20/16 0952 Last data filed at 05/20/16 [id number]  Gross per 24 hour  Intake              441 ml  Output              350 ml  Net               91 ml   Filed Weights   05/19/16 1230 05/19/16 2345  Weight: 153 lb (69.4 kg) 145 lb (65.8 kg)    Physical Exam    GEN: Well nourished, well developed, older WM in no acute distress.  HEENT: Grossly normal.  Neck: Supple, no JVD, carotid bruits, or masses. Cardiac: RRR, no murmurs, rubs, or gallops. No clubbing, cyanosis, edema.  Radials/DP/PT 2+ and equal bilaterally.  Respiratory:  Respirations  regular and unlabored, clear to auscultation bilaterally. GI: Soft, nontender, nondistended, BS + x 4. MS: no deformity or atrophy. Skin: warm and dry, no rash. Neuro:  Strength and sensation are intact. Psych: AAOx3.  Normal affect.  Labs    CBC  Recent Labs  05/19/16 1228 05/20/16 0555  WBC 7.6 5.2  HGB 13.7 13.0  HCT 40.8 38.6*  MCV 104.1* 101.6*  PLT 184 0000000   Basic Metabolic Panel  Recent Labs  05/19/16 1228 05/20/16 0555  NA 140 136  K 4.1 3.7  CL 107 106  CO2 24 24  GLUCOSE 243* 125*  BUN 18 14  CREATININE 1.20 0.98  CALCIUM 9.7 8.8*   Liver Function Tests No results for input(s): AST, ALT, ALKPHOS, BILITOT, PROT, ALBUMIN in the last 72 hours. No results for input(s): LIPASE, AMYLASE in the last 72 hours. Cardiac Enzymes  Recent Labs  05/20/16 0101 05/20/16 0555  TROPONINI 0.05* 0.05*   BNP Invalid input(s): POCBNP D-Dimer  Recent Labs  05/19/16 1228  DDIMER 0.29   Hemoglobin A1C No results for input(s): HGBA1C in the last 72 hours. Fasting Lipid Panel  Recent Labs  05/20/16 0101  CHOL 122  HDL 48  LDLCALC 60  TRIG 70  CHOLHDL 2.5   Thyroid Function Tests  Recent Labs  05/20/16 0200  TSH 2.691    Telemetry    SR with freq PACs, PVCs- Personally Reviewed  ECG    SR with freq PVCs, PACs- Personally Reviewed  Radiology    Dg Chest 2 View  Result Date: 05/19/2016 CLINICAL DATA:  83 year old male with progressive intermittent chest pain for few weeks. Initial encounter. EXAM: CHEST  2 VIEW COMPARISON:  Chest radiograph 11/03/2015 and earlier. FINDINGS: Stable lung volumes. Chronic perihilar and peripheral increased pulmonary interstitial opacity worse on the right. No superimposed pneumothorax, pulmonary edema, pleural effusion or acute pulmonary opacity. Stable cardiac size and mediastinal contours. Calcified aortic atherosclerosis. Mild tortuosity of the aorta. Visualized tracheal air column is within normal limits. No acute  osseous abnormality identified. Stable cholecystectomy clips. Negative visible bowel gas pattern. IMPRESSION: 1. Stable chronic lung disease. No superimposed acute findings are identified. 2. Calcified aortic atherosclerosis. Electronically Signed   By: Genevie Ann M.D.   On: 05/19/2016 12:56    Cardiac Studies     Patient Profile     83 y.o. male CAD s/p DES to mLAD in 2012, PAF, HTN, HLD, DM, left carotid bruit, lower GI bleed  and chronic diastolic CHF who presented to the office for chest pain and reported taking SL intermittently over the past month for chest tightness. He was sent to the ED for further work up, and cardiology admission.  Assessment & Plan    1. Active unstable angina with hx of CAD - Recently requiring multiple doses of sublingual nitroglycerin with resolution per his report. While in clinic yesterday he required 2 SL nitro for chest tightness. Pain completley resolved after 20 mins. --Last cath 08/2013 showed widely patent stent in mLAD, 80% prox D1 (small vessel) with recommendation for medical management, residual 60% ostial PDA, <20% LCx.  --Last saw Dr. [name] on 10/17 when he reported taking intermittent angina, and encouraged to use his SL nitro as needed. Noted hesitancy to place on Plavix given GI bleed in the past and hx of myelodysplastic syndrome. Does have a remote hx of brief GI bleed in 5/16 that resolved, seen by GI with no invasive work up.  -- planned for LHC today. Will discuss with MD whether to proceed with cath today.  2. HTN - Remains elevated. Lisinopril was held on admission, and Imdur increased to 120mg  daily. Planned to restart lisinopril   3. HLD - 11/03/2015: Cholesterol 101; HDL 37; LDL Cholesterol 50; Triglycerides 68; VLDL 14  - Continue statin.   4. PAF - Sinus rhythm with PVCs, PACs on telemetry. On coumadin, though INR is subtherapeutic. States he is compliant with home meds?   5. Elevated Trop: Mild, flat non ACS trend. Currently  without chest tightness, blood pressure was elevated on admission. Could be from demand ischemia.   Signed, Reino Bellis, NP  05/20/2016, 9:52 AM

## 2016-05-20 NOTE — H&P (Signed)
CARDIOLOGY H&P  CC: Fullness in the chest  HPI: [name] is a 83 year old male with a history of coronary artery disease post drug-eluting stent to the mid LAD in 2012, diabetes, hypertension, hyperlipidemia, carotid bruit, PAF on Coumadin, diastolic heart failure, and a history of lower GI bleeding who presents with worsening fullness in his chest. Notably when I saw the patient, he was mildly confused and was only able to provide limited history.  He was last seen here in 10/2015 for dizziness in the setting of hypotension and bradycardia. At that time both his Carvedilol and Imdur were decreased and he was discharged to outpatient follow-up. Since then he has noted worsening fullness in his chest which limits his ability to walk after more than half a block. He has been using more sublingual Nitroglycerin when necessary. He notes that his pain fully resolves after 2 nitroglycerin and rest. This is been a chronic issue for at least the past 3 months and perhaps as long as one year. His medical history notes that he is not exerting himself much since at least 06/27/2014.   His last note from Dr. Cathie Olden in 02/2016 notes that he has been hospitalized in the recent past for GI bleeding and for this reason Dr. Katharina Caper is reluctant to add Clopidogrel (e.g. pursue invasive interventions). He also has low-grade myelodysplastic syndrome.  He presently denies any chest pain or as he puts it fullness in his chest, shortness of breath, dyspnea on exertion, palpitations, orthopnea, PND, claudication symptoms, stroke or TIA symptoms. His blood pressure in the emergency department was as high as 192/83.  He currently lives alone and cares for himself, including his medications. He wishes to go home as soon as possible.  Review of Systems:     Cardiac Review of Systems: {Y] = yes [ ]  = no  Chest Pain [X   Resting SOB [   ] Exertional SOB  [X]   Orthopnea [  ]   Pedal Edema [   ]    Palpitations [   ] Syncope  [  ]   Presyncope [   ]  General Review of Systems: [Y] = yes [  ]=no Constitional: recent weight change [  ]; anorexia [  ]; fatigue [  ]; nausea [  ]; night sweats [  ]; fever [  ]; or chills [  ];                                                                     Dental: poor dentition[  ];   Eye : blurred vision [  ]; diplopia [   ]; vision changes [  ];  Amaurosis fugax[  ]; Resp: cough [  ];  wheezing[  ];  hemoptysis[  ]; shortness of breath[  ]; paroxysmal nocturnal dyspnea[  ]; dyspnea on exertion[  ]; or orthopnea[  ];  GI:  gallstones[  ], vomiting[  ];  dysphagia[  ]; melena[  ];  hematochezia [  ]; heartburn[  ];   GU: kidney stones [  ]; hematuria[  ];   dysuria [  ];  nocturia[  ];               Skin: rash [  ],  swelling[  ];, hair loss[  ];  peripheral edema[  ];  or itching[  ]; Musculosketetal: myalgias[  ];  joint swelling[  ];  joint erythema[  ];  joint pain[  ];  back pain[  ];  Heme/Lymph: bruising[  ];  bleeding[  ];  anemia[  ];  Neuro: TIA[  ];  headaches[  ];  stroke[  ];  vertigo[  ];  seizures[  ];   paresthesias[  ];  difficulty walking[  ];  Psych:depression[  ]; anxiety[  ];  Endocrine: diabetes[  ];  thyroid dysfunction[  ];  Other:  Past Medical History:  Diagnosis Date  . Aortic stenosis    a. Mild-mod by echo 2013.  . Barrett esophagus   . Chronic anticoagulation   . Chronic systolic CHF (congestive heart failure) (HCC)    a. EF previously 35-40%. b. improved to 55-65% by cath 08/2013.  [name] Colon cancer ([place])    a. 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 b.  "graduated" from their practice 06/2013, remains free of any new disease per those notes.  . Coronary artery disease    a. s/p PCI w/ DES to mLAD 08/05/10. b. NSTEMI 08/2013 (mildly elev trop) - cath showing widely patent stent, 80% prox D1 (small vessel) with recommendation for medical management, residual 60% ostial PDA, <20% LCx, LVEF 55-65%.   . Depression   . Gallstone  pancreatitis 2011   a. s/p chole.  [name] GERD (gastroesophageal reflux disease)   . Hyperlipidemia   . Hypertension   . Macrocytic anemia 06/25/2011  . Myelodysplastic disease ([place])    a. Suspected low-grade  myelodysplastic disease per onc notes.  . Other pancytopenia ([place]) 06/18/2014   Mild, fluctuating, likely med related. Rule out early MDS from prior chemo/RT or colon cancer  . PAF (paroxysmal atrial fibrillation) ([place])    a. failed TEE due to inability to pass probe into the esophagus in March 2013. b. On Coumadin. Was in NSR 08/2013 admission.  . Premature atrial contractions   . Premature ventricular contractions   . SBO (small bowel obstruction) 2005  . Type II diabetes mellitus ([place])     Prior to Admission medications   Medication Sig Start Date End Date Taking? Authorizing Provider  atorvastatin (LIPITOR) 40 MG tablet TAKE ONE TABLET BY MOUTH ONCE DAILY AT  6  PM 01/16/16  Yes Thayer Headings, MD  B Complex-C (B-COMPLEX WITH VITAMIN C) tablet Take 1 tablet by mouth daily.   Yes Historical Provider, MD  carvedilol (COREG) 12.5 MG tablet Take 1 tablet (12.5 mg total) by mouth 2 (two) times daily with a meal. Patient taking differently: Take 25 mg by mouth 2 (two) times daily with a meal.  11/04/15  Yes Clanford Marisa Hua, MD  Cholecalciferol (VITAMIN D-3 PO) Take 1 tablet by mouth daily.    Yes Historical Provider, MD  donepezil (ARICEPT) 10 MG tablet Take 10 mg by mouth at bedtime.  05/04/16  Yes Historical Provider, MD  finasteride (PROSCAR) 5 MG tablet Take 5 mg by mouth at bedtime.  04/01/14  Yes Historical Provider, MD  isosorbide mononitrate (IMDUR) 60 MG 24 hr tablet Take 1 tablet (60 mg total) by mouth daily. Patient taking differently: Take 90 mg by mouth daily.  11/04/15  Yes Clanford Marisa Hua, MD  linagliptin (TRADJENTA) 5 MG TABS tablet Take 1 tablet (5 mg total) by mouth daily. For blood sugar 11/04/15  Yes Clanford Marisa Hua, MD  lisinopril (PRINIVIL,ZESTRIL)  5 MG tablet Take  5 mg by mouth daily. 01/27/15  Yes Historical Provider, MD  nitroGLYCERIN (NITROSTAT) 0.4 MG SL tablet Place 1 tablet (0.4 mg total) under the tongue every 5 (five) minutes as needed for chest pain. 07/30/15  Yes Thayer Headings, MD  Omega-3 Fatty Acids (FISH OIL) 1000 MG CAPS Take 1,000 mg by mouth daily.   Yes Historical Provider, MD  RANEXA 1000 MG SR tablet Take 1,000 mg by mouth 2 (two) times daily.  05/10/16  Yes Historical Provider, MD  warfarin (COUMADIN) 4 MG tablet Take 0.5-1 tablets (2-4 mg total) by mouth daily. Take 2 mg on Tues / Thurs ** Take 4 mg on Sun / Mon / Wed / Fri / Sat  START ON 09/18/14-Wednesday-MAKE SURE YOU GO TO YOUR PRIMARY DOCTOR'S OFFICE FOR CBC/INR CHECK WITHIN 1 WEEK Patient taking differently: Take 4 mg by mouth daily.  09/18/14  Yes Shanker Kristeen Mans, MD    Allergies  Allergen Reactions  . Metformin And Related Nausea And Vomiting    Social History   Social History  . Marital status: Single    Spouse name: N/A  . Number of children: 0  . Years of education: N/A   Occupational History  . Not on file.   Social History Main Topics  . Smoking status: Former Smoker    Packs/day: 1.00    Years: 30.00    Types: Cigarettes    Quit date: 05/10/1994  . Smokeless tobacco: Never Used  . Alcohol use 1.2 oz/week    2 Shots of liquor per week     Comment: occasionally.  . Drug use: No  . Sexual activity: No   Other Topics Concern  . Not on file   Social History Narrative   Lives alone.      Family History  Problem Relation Age of Onset  . Cancer Mother   . Ulcers Father 43  . Heart attack Brother 70    PHYSICAL EXAM: Vitals:   05/19/16 2245 05/19/16 2345  BP: (!) 158/105 (!) 155/85  Pulse: 82 70  Resp:  18  Temp:  97.9 F (36.6 C)   General:  Well appearing. No respiratory difficulty. Pleasant. Has occasional difficulty with finding words. HEENT: Normal Neck: Supple. no JVD. Carotids 2+ bilat; L carotid bruits. No lymphadenopathy or  thryomegaly appreciated. Cor: PMI nondisplaced. Irregular rate & rhythm. No rubs, gallops or murmurs. Lungs: clear Abdomen: Soft, nontender, nondistended. No hepatosplenomegaly. No bruits or masses. Good bowel sounds. Extremities: No cyanosis, clubbing, rash, edema Neuro: Alert & oriented x 3, cranial nerves grossly intact. moves all 4 extremities w/o difficulty. Affect pleasant.  ECG: Sinus rhythm, right bundle branch block, frequent PACs and PVCs  Results for orders placed or performed during the hospital encounter of 05/19/16 (from the past 24 hour(s))  Basic metabolic panel     Status: Abnormal   Collection Time: 05/19/16 12:28 PM  Result Value Ref Range   Sodium 140 135 - 145 mmol/L   Potassium 4.1 3.5 - 5.1 mmol/L   Chloride 107 101 - 111 mmol/L   CO2 24 22 - 32 mmol/L   Glucose, Bld 243 (H) 65 - 99 mg/dL   BUN 18 6 - 20 mg/dL   Creatinine, Ser 1.20 0.61 - 1.24 mg/dL   Calcium 9.7 8.9 - 10.3 mg/dL   GFR calc non Af Amer 51 (L) >60 mL/min   GFR calc Af Amer 60 (L) >60 mL/min   Anion gap 9 5 -  15  CBC     Status: Abnormal   Collection Time: 05/19/16 12:28 PM  Result Value Ref Range   WBC 7.6 4.0 - 10.5 K/uL   RBC 3.92 (L) 4.22 - 5.81 MIL/uL   Hemoglobin 13.7 13.0 - 17.0 g/dL   HCT 40.8 39.0 - 52.0 %   MCV 104.1 (H) 78.0 - 100.0 fL   MCH 34.9 (H) 26.0 - 34.0 pg   MCHC 33.6 30.0 - 36.0 g/dL   RDW 13.7 11.5 - 15.5 %   Platelets 184 150 - 400 K/uL  Protime-INR     Status: Abnormal   Collection Time: 05/19/16 12:28 PM  Result Value Ref Range   Prothrombin Time 19.1 (H) 11.4 - 15.2 seconds   INR 1.59   D-dimer, quantitative (not at [facility])     Status: None   Collection Time: 05/19/16 12:28 PM  Result Value Ref Range   D-Dimer, Quant 0.29 0.00 - 0.50 ug/mL-FEU  I-stat troponin, ED     Status: None   Collection Time: 05/19/16 12:40 PM  Result Value Ref Range   Troponin i, poc 0.02 0.00 - 0.08 ng/mL   Comment 3          Glucose, capillary     Status: Abnormal   Collection  Time: 05/19/16 11:58 PM  Result Value Ref Range   Glucose-Capillary 143 (H) 65 - 99 mg/dL   Dg Chest 2 View  Result Date: 05/19/2016 CLINICAL DATA:  83 year old male with progressive intermittent chest pain for few weeks. Initial encounter. EXAM: CHEST  2 VIEW COMPARISON:  Chest radiograph 11/03/2015 and earlier. FINDINGS: Stable lung volumes. Chronic perihilar and peripheral increased pulmonary interstitial opacity worse on the right. No superimposed pneumothorax, pulmonary edema, pleural effusion or acute pulmonary opacity. Stable cardiac size and mediastinal contours. Calcified aortic atherosclerosis. Mild tortuosity of the aorta. Visualized tracheal air column is within normal limits. No acute osseous abnormality identified. Stable cholecystectomy clips. Negative visible bowel gas pattern. IMPRESSION: 1. Stable chronic lung disease. No superimposed acute findings are identified. 2. Calcified aortic atherosclerosis. Electronically Signed   By: Genevie Ann M.D.   On: 05/19/2016 12:56    ASSESSMENT: Mr. Hambelton is a 83 year old male with a history of coronary artery disease post drug-eluting stent to the mid LAD in 2012, diabetes, hypertension, hyperlipidemia, carotid bruit, PAF on Coumadin, diastolic heart failure, and a history of lower GI bleeding who presents with accelerating angina since the decrease of his Coreg and Imdur in 10/2015. While in an ideal setting he would be a good candidate for invasive interventions, given his history of lower GI bleeding which has made his primary cardiologist reluctant to add on Clopidogrel, his mild myelodysplastic syndrome, and his fluctuating INRs which may indicate a variable compliance with medications, the better part of valor here may be to optimize his medication regimen and only then pursue invasive interventions if his symptoms worsen.  PLAN/DISCUSSION: #) Cv(i) - accelerating angina, worsened since the decrease of his Coreg and Imdur in 10/2015 Dx: -  CBC, Chem 7, Trops TSH, A1c, Lipid panel - EKG, Tele - Limited TTE eval LVEF in the AM Tx: - ASA 81 mg daily - No need for Heparin gtt (either for ACS or bridge) - Atorvastatin 40 mg daily - Coreg 12.5 mg b.i.d. (confirm dose in AM) - STOP Lisinopril 5 mg daily and begin Imdur 120 mg daily; add back Lisinopril as BP tolerates - Increase Imdur to 120 mg daily  - Ranexa 1000 mg b.i.d.  #)  Cv(r) - PAF, currently in sinus rhythm Dx: - EKG, Tele Tx: - Coreg 25 mg b.i.d. Instead of 12.5 mg b.i.d. - Coumadin, goal 2-3 - No need for Heparin gtt (either for ACS or bridge)  #) Cv(p) - normal LVEF most recently, LVEF 40% in 2013 (I'm unable to see the study, was it done when he was in PAF?) Dx: - Limited TTE eval LVEF in the AM Tx: - Coreg 12.5 mg b.i.d. (confirm dose in AM) - STOP Lisinopril 5 mg daily and begin Imdur 120 mg daily; add back Lisinopril as BP tolerates  #) Cv(v) - n/a  #) DM - continue home regimen  #) HTN - titrate BP as above  #) HL - Atorvastatin  #) h/o LGIB - CBC  #) FULL CODE  #) ATO  Allyson Sabal, MD Cardiology

## 2016-05-20 NOTE — Progress Notes (Signed)
CRITICAL VALUE ALERT  Critical value received:  Troponin 0.05  Date of notification:  05/20/2016  Time of notification:  2 am  Critical value read back:Yes.    Nurse who received alert:  Kahley Leib  MD notified (1st page):  Yes  Time of first page:  2:05  MD notified (2nd page):  Time of second page:  Responding MD:  [place], Cardiology   Time MD responded:  2:06am  Pt asymptomatic, asleep. Will continue to monitor. No orders at this time.  Yaman Grauberger, RN

## 2016-05-21 ENCOUNTER — Encounter (HOSPITAL_COMMUNITY): Payer: Self-pay | Admitting: Cardiovascular Disease

## 2016-05-21 ENCOUNTER — Telehealth: Payer: Self-pay | Admitting: Physician Assistant

## 2016-05-21 ENCOUNTER — Other Ambulatory Visit: Payer: Self-pay | Admitting: Cardiology

## 2016-05-21 DIAGNOSIS — I119 Hypertensive heart disease without heart failure: Secondary | ICD-10-CM | POA: Diagnosis not present

## 2016-05-21 DIAGNOSIS — E7800 Pure hypercholesterolemia, unspecified: Secondary | ICD-10-CM | POA: Diagnosis not present

## 2016-05-21 DIAGNOSIS — I5032 Chronic diastolic (congestive) heart failure: Secondary | ICD-10-CM | POA: Diagnosis not present

## 2016-05-21 DIAGNOSIS — Z7901 Long term (current) use of anticoagulants: Secondary | ICD-10-CM

## 2016-05-21 DIAGNOSIS — I214 Non-ST elevation (NSTEMI) myocardial infarction: Secondary | ICD-10-CM | POA: Diagnosis not present

## 2016-05-21 LAB — GLUCOSE, CAPILLARY
GLUCOSE-CAPILLARY: 131 mg/dL — AB (ref 65–99)
Glucose-Capillary: 164 mg/dL — ABNORMAL HIGH (ref 65–99)

## 2016-05-21 LAB — BASIC METABOLIC PANEL
ANION GAP: 9 (ref 5–15)
BUN: 17 mg/dL (ref 6–20)
CO2: 22 mmol/L (ref 22–32)
Calcium: 8.7 mg/dL — ABNORMAL LOW (ref 8.9–10.3)
Chloride: 108 mmol/L (ref 101–111)
Creatinine, Ser: 1.07 mg/dL (ref 0.61–1.24)
GFR calc Af Amer: >60 mL/min (ref >60)
GFR calc non Af Amer: 59 mL/min — ABNORMAL LOW (ref >60)
GLUCOSE: 109 mg/dL — AB (ref 65–99)
Potassium: 3.8 mmol/L (ref 3.5–5.1)
Sodium: 139 mmol/L (ref 135–145)

## 2016-05-21 LAB — CBC
HCT: 34.5 % — ABNORMAL LOW (ref 39.0–52.0)
Hemoglobin: 11.7 g/dL — ABNORMAL LOW (ref 13.0–17.0)
MCH: 34.3 pg — AB (ref 26.0–34.0)
MCHC: 33.9 g/dL (ref 30.0–36.0)
MCV: 101.2 fL — AB (ref 78.0–100.0)
PLATELETS: 149 10*3/uL — AB (ref 150–400)
RBC: 3.41 MIL/uL — ABNORMAL LOW (ref 4.22–5.81)
RDW: 13.4 % (ref 11.5–15.5)
WBC: 4.6 10*3/uL (ref 4.0–10.5)

## 2016-05-21 LAB — PROTIME-INR
INR: 1.3
Prothrombin Time: 16.2 seconds — ABNORMAL HIGH (ref 11.4–15.2)

## 2016-05-21 LAB — HEMOGLOBIN A1C
HEMOGLOBIN A1C: 6.7 % — AB (ref 4.8–5.6)
MEAN PLASMA GLUCOSE: 146 mg/dL

## 2016-05-21 MED ORDER — WARFARIN SODIUM 4 MG PO TABS: 2.0000 mg | ORAL_TABLET | Freq: Every day | ORAL | Status: DC

## 2016-05-21 MED ORDER — ISOSORBIDE MONONITRATE ER 60 MG PO TB24: 120.0000 mg | ORAL_TABLET | Freq: Every day | ORAL | 3 refills | Status: DC

## 2016-05-21 MED ORDER — ENSURE ENLIVE PO LIQD
237.0000 mL | Freq: Two times a day (BID) | ORAL | Status: DC
  Administered 2016-05-21: 237 mL via ORAL

## 2016-05-21 MED ORDER — ADULT MULTIVITAMIN W/MINERALS CH
1.0000 | ORAL_TABLET | Freq: Every day | ORAL | Status: DC
  Administered 2016-05-21: 1 via ORAL
  Filled 2016-05-21: qty 1

## 2016-05-21 MED ORDER — LISINOPRIL 5 MG PO TABS: 5.0000 mg | ORAL_TABLET | Freq: Every day | ORAL | 6 refills | Status: DC

## 2016-05-21 NOTE — Progress Notes (Signed)
Pt continues to ambulate independently to the restroom. If path is clear pt does ambulate well on his own; however exhibits difficulty getting out of sheets/etc when in bed at take off. Pt continues to express understanding of safety plan, but fails to comply. Pt seems very mildly confused this am, night shift reported no problems; yesterday evening pt began to exhibit signs of confusion at sunset.

## 2016-05-21 NOTE — Progress Notes (Signed)
Upon discharge Pt is stable, c/a/ox4 with no signs of distress. Pt did arrive through the emergency department via private vehicle; security contacted to meet pt and Roff at ED entrance. Per security, pt will be picked up at ED door and taken to their car by security vehicle.

## 2016-05-21 NOTE — Progress Notes (Signed)
Call placed to C[facility] to notify of telemetry monitoring d/c.   

## 2016-05-21 NOTE — Progress Notes (Signed)
Patient Name: [name] Date of Encounter: 05/21/2016  Primary Cardiologist: Dr. Antonieta Loveless Problem List     Active Problems:   Chest pain   Malnutrition of moderate degree   Hypertensive heart disease without heart failure   Pure hypercholesterolemia   Chronic diastolic heart failure (HCC)    Subjective   Feels well this morning. Ready to go home.   Inpatient Medications    Scheduled Meds: . atorvastatin  40 mg Oral q1800  . carvedilol  12.5 mg Oral BID WC  . donepezil  10 mg Oral QHS  . feeding supplement (ENSURE ENLIVE)  237 mL Oral BID PC  . finasteride  5 mg Oral QHS  . heparin  5,000 Units Subcutaneous Q8H  . isosorbide mononitrate  120 mg Oral Daily  . linagliptin  5 mg Oral Daily  . lisinopril  5 mg Oral Daily  . multivitamin with minerals  1 tablet Oral Daily  . ranolazine  1,000 mg Oral BID  . sodium chloride flush  3 mL Intravenous Q12H  . sodium chloride flush  3 mL Intravenous Q12H  . warfarin  2 mg Oral Once per day on Tue Thu  . warfarin  4 mg Oral Once per day on Sun Mon Wed Fri Sat  . Warfarin - Physician Dosing Inpatient   Does not apply q1800   Continuous Infusions:  PRN Meds: sodium chloride, acetaminophen, morphine injection, nitroGLYCERIN, ondansetron (ZOFRAN) IV, sodium chloride flush, sodium chloride flush   Vital Signs    Vitals:   05/20/16 1750 05/20/16 1833 05/20/16 2023 05/21/16 0511  BP: 120/78 (!) 143/64 128/72 (!) 162/67  Pulse: 72 75 79 81  Resp:   18 18  Temp:   98.2 F (36.8 C) 98.4 F (36.9 C)  TempSrc:   Oral Oral  SpO2:   91% 95%  Weight:    146 lb (66.2 kg)  Height:        Intake/Output Summary (Last 24 hours) at 05/21/16 0927 Last data filed at 05/21/16 0847  Gross per 24 hour  Intake              440 ml  Output              600 ml  Net             -160 ml   Filed Weights   05/19/16 1230 05/19/16 2345 05/21/16 0511  Weight: 153 lb (69.4 kg) 145 lb (65.8 kg) 146 lb (66.2 kg)    Physical Exam     GEN: Well nourished, well developed, older WM in no acute distress.  HEENT: Grossly normal.  Neck: Supple, no JVD, carotid bruits, or masses. Cardiac: RRR, no murmurs, rubs, or gallops. No clubbing, cyanosis, edema.  Radials/DP/PT 2+ and equal bilaterally.  Respiratory:  Respirations regular and unlabored, clear to auscultation bilaterally. GI: Soft, nontender, nondistended, BS + x 4. MS: no deformity or atrophy. Right groin without bruising or hematoma Skin: warm and dry, no rash. Neuro:  Strength and sensation are intact. Psych: AAOx3.  Normal affect.  Labs    CBC  Recent Labs  05/20/16 0555 05/21/16 0539  WBC 5.2 4.6  HGB 13.0 11.7*  HCT 38.6* 34.5*  MCV 101.6* 101.2*  PLT 152 123456*   Basic Metabolic Panel  Recent Labs  05/20/16 0555 05/21/16 0539  NA 136 139  K 3.7 3.8  CL 106 108  CO2 24 22  GLUCOSE 125* 109*  BUN 14 17  CREATININE 0.98 1.07  CALCIUM 8.8* 8.7*   Liver Function Tests No results for input(s): AST, ALT, ALKPHOS, BILITOT, PROT, ALBUMIN in the last 72 hours. No results for input(s): LIPASE, AMYLASE in the last 72 hours. Cardiac Enzymes  Recent Labs  05/20/16 0101 05/20/16 0555 05/20/16 1051  TROPONINI 0.05* 0.05* 0.04*   BNP Invalid input(s): POCBNP D-Dimer  Recent Labs  05/19/16 1228  DDIMER 0.29   Hemoglobin A1C  Recent Labs  05/20/16 0200  HGBA1C 6.7*   Fasting Lipid Panel  Recent Labs  05/20/16 0101  CHOL 122  HDL 48  LDLCALC 60  TRIG 70  CHOLHDL 2.5   Thyroid Function Tests  Recent Labs  05/20/16 0200  TSH 2.691    Telemetry    SR with freq PACs, PVCs- Personally Reviewed  ECG    SR with freq PVCs, PACs- Personally Reviewed  Radiology    Dg Chest 2 View  Result Date: 05/19/2016 CLINICAL DATA:  83 year old male with progressive intermittent chest pain for few weeks. Initial encounter. EXAM: CHEST  2 VIEW COMPARISON:  Chest radiograph 11/03/2015 and earlier. FINDINGS: Stable lung volumes.  Chronic perihilar and peripheral increased pulmonary interstitial opacity worse on the right. No superimposed pneumothorax, pulmonary edema, pleural effusion or acute pulmonary opacity. Stable cardiac size and mediastinal contours. Calcified aortic atherosclerosis. Mild tortuosity of the aorta. Visualized tracheal air column is within normal limits. No acute osseous abnormality identified. Stable cholecystectomy clips. Negative visible bowel gas pattern. IMPRESSION: 1. Stable chronic lung disease. No superimposed acute findings are identified. 2. Calcified aortic atherosclerosis. Electronically Signed   By: Genevie Ann M.D.   On: 05/19/2016 12:56    Cardiac Studies   LHC: 05/20/16  Conclusion     The left ventricular systolic function is normal.  LV end diastolic pressure is normal.  Prox LAD to Mid LAD lesion, 0 %stenosed.  2nd Diag lesion, 80 %stenosed.  Ost RCA lesion, 50 %stenosed   IMPRESSION: Mr. Lamke has a widely patent LAD stent with no significant CAD and unchanged anatomy compared to his last cath 3 years ago. He has chronic stable angina possibly related to a small diagonal branch stenosis. His groin was successfully sealed with a minx device and he left the lab in stable condition. He can be discharged home tomorrow and begin Coumadin anticoagulation as an outpatient. He is currently in sinus rhythm but has a history of PAF. His attending cardiologist Dr. Liam Rogers is aware of these results.   Patient Profile     83 y.o. male CAD s/p DES to mLAD in 2012, PAF, HTN, HLD, DM, left carotid bruit, lower GI bleed  and chronic diastolic CHF who presented to the office for chest pain and reported taking SL intermittently over the past month for chest tightness. He was sent to the ED for further work up, and cardiology admission.  Assessment & Plan    1. Active unstable angina with hx of CAD - Recently requiring multiple doses of sublingual nitroglycerin with resolution per his  report. While in clinic he required 2 SL nitro for chest tightness. Pain completley resolved after 20 mins. --Last cath 08/2013 showed widely patent stent in mLAD, 80% prox D1 (small vessel) with recommendation for medical management, residual 60% ostial PDA, <20% LCx.  --Last saw Dr. [name] on 10/17 when he reported taking intermittent angina, and encouraged to use his SL nitro as needed. Noted hesitancy to place on Plavix given GI bleed in the past and hx of myelodysplastic  syndrome. Does have a remote hx of brief GI bleed in 5/16 that resolved, seen by GI with no invasive work up.  -- Underwent LHC yesterday showing widely patent LAD stent, with 80% small diag branch. He will be continued on medical therapy. Coumadin was resumed per pharmacy dosing.   2. HTN - Remains elevated. Lisinopril was held on admission, and Imdur increased to 120mg  daily. Lisinopril restarted yesterday afternoon. Hypertensive this morning.   3. HLD - 11/03/2015: Cholesterol 101; HDL 37; LDL Cholesterol 50; Triglycerides 68; VLDL 14  - Continue statin.   4. PAF - Sinus rhythm with PVCs, PACs on telemetry. On coumadin, though INR is subtherapeutic. INR is 1.3 today. Will arrange follow up INR for next week.   5. Elevated Trop: Mild, flat non ACS trend. Currently without chest tightness, blood pressure was elevated on admission. Cath as above.  Signed, Reino Bellis, NP  05/21/2016, 9:27 AM

## 2016-05-21 NOTE — Telephone Encounter (Signed)
New message       TCM appt on 06-02-16 with Vin per Ria Comment

## 2016-05-21 NOTE — Discharge Summary (Signed)
Discharge Summary    Patient ID: [name],  MRN: [id number], DOB/AGE: 06-15-1923 83 y.o.  Admit date: 05/19/2016 Discharge date: 05/21/2016  Primary Care Provider: Irven Shelling Primary Cardiologist: Dr. [name]  Discharge Diagnoses    Active Problems:   Chest pain   Malnutrition of moderate degree   Hypertensive heart disease without heart failure   Pure hypercholesterolemia   Chronic diastolic heart failure (HCC)   Allergies Allergies  Allergen Reactions  . Metformin And Related Nausea And Vomiting    Diagnostic Studies/Procedures    LHC: 05/20/16   Conclusion     The left ventricular systolic function is normal.  LV end diastolic pressure is normal.  Prox LAD to Mid LAD lesion, 0 %stenosed.  2nd Diag lesion, 80 %stenosed.  Ost RCA lesion, 50 %stenosed.   IMPRESSION: Mr. [name] has a widely patent LAD stent with no significant CAD and unchanged anatomy compared to his last cath 3 years ago. He has chronic stable angina possibly related to a small diagonal branch stenosis. His groin was successfully sealed with a minx device and he left the lab in stable condition. He can be discharged home tomorrow and begin Coumadin anticoagulation as an outpatient. He is currently in sinus rhythm but has a history of PAF. His attending cardiologist Dr. Liam Rogers is aware of these results.  Diagnostic Diagram       _____________   History of Present Illness     83 yo male with PMH of  CAD s/p DES to mLAD in 2012, PAF, HTN, HLD, DM, left carotid bruit, lower GI bleed  and chronic diastolic CHF presents for chest pain. He was seen in the office on 05/19/16 and reported worsening chest pain for the past few weeks. He was taking 2-3 SL nitro at time with resolution.  He requires nitro multiple times in one week. On the day of admission he woke up with a 10 out of 10 substernal chest tightness. No other associated symptoms. Here he complains of 8 out of 10  chest tightness. Took his one sublingual nitroglycerin while in the office > improved chest tightness to 5 out of 10 after 5 minutes. His chest tightness still remained 5/10 after another nitro. BP reduced to 100/60 and felt dizzy. Chest chest tightness resolved completely after another 10 minutes. Given these symptoms he was sent to the ED for further work up and admission.     Hospital Course     Consultants: None   He was admitted and planned for LHC to redefine anatomy. Troponins cycled with flat non diagnostic trend. On admission his Imdur was increased to 120mg  daily and lisinopril held. There was concern regarding a hx of GI bleed from 2016, but this was brief and has not had any complications since that time.   He underwent LHC on 05/20/16 showing widely patent LAD stent with 80% second diag lesion. He was continued on medical therapy and his coumadin resumed. Lisinopril 5mg  daily was resumed. Labs were stable post procedure with Hgb 11.7. His INR was subtherapeutic on admission, though he reported being compliant with his home medications. Discussed with pharmacy, will plan to have him take 4mg  daily for the next 3 days, and have INR checked on Monday. Noted to be slightly hypertensive on the morning of discharge but had not received his daily blood pressure medications.   He was seen and assessed by Dr. Oval Linsey on 05/21/16 and determined stable for discharge home. He has follow  up appt arranged in the office. Need to follow up regarding blood pressure management in the outpatient setting  _____________  Discharge Vitals Blood pressure 132/67, pulse 71, temperature 97.2 F (36.2 C), temperature source Oral, resp. rate 18, height 5\' 9"  (1.753 m), weight 146 lb (66.2 kg), SpO2 96 %.  Filed Weights   05/19/16 1230 05/19/16 2345 05/21/16 0511  Weight: 153 lb (69.4 kg) 145 lb (65.8 kg) 146 lb (66.2 kg)    Labs & Radiologic Studies    CBC  Recent Labs  05/20/16 0555 05/21/16 0539  WBC  5.2 4.6  HGB 13.0 11.7*  HCT 38.6* 34.5*  MCV 101.6* 101.2*  PLT 152 123456*   Basic Metabolic Panel  Recent Labs  05/20/16 0555 05/21/16 0539  NA 136 139  K 3.7 3.8  CL 106 108  CO2 24 22  GLUCOSE 125* 109*  BUN 14 17  CREATININE 0.98 1.07  CALCIUM 8.8* 8.7*   Liver Function Tests No results for input(s): AST, ALT, ALKPHOS, BILITOT, PROT, ALBUMIN in the last 72 hours. No results for input(s): LIPASE, AMYLASE in the last 72 hours. Cardiac Enzymes  Recent Labs  05/20/16 0101 05/20/16 0555 05/20/16 1051  TROPONINI 0.05* 0.05* 0.04*   BNP Invalid input(s): POCBNP D-Dimer  Recent Labs  05/19/16 1228  DDIMER 0.29   Hemoglobin A1C  Recent Labs  05/20/16 0200  HGBA1C 6.7*   Fasting Lipid Panel  Recent Labs  05/20/16 0101  CHOL 122  HDL 48  LDLCALC 60  TRIG 70  CHOLHDL 2.5   Thyroid Function Tests  Recent Labs  05/20/16 0200  TSH 2.691   _____________  Dg Chest 2 View  Result Date: 05/19/2016 CLINICAL DATA:  83 year old male with progressive intermittent chest pain for few weeks. Initial encounter. EXAM: CHEST  2 VIEW COMPARISON:  Chest radiograph 11/03/2015 and earlier. FINDINGS: Stable lung volumes. Chronic perihilar and peripheral increased pulmonary interstitial opacity worse on the right. No superimposed pneumothorax, pulmonary edema, pleural effusion or acute pulmonary opacity. Stable cardiac size and mediastinal contours. Calcified aortic atherosclerosis. Mild tortuosity of the aorta. Visualized tracheal air column is within normal limits. No acute osseous abnormality identified. Stable cholecystectomy clips. Negative visible bowel gas pattern. IMPRESSION: 1. Stable chronic lung disease. No superimposed acute findings are identified. 2. Calcified aortic atherosclerosis. Electronically Signed   By: Genevie Ann M.D.   On: 05/19/2016 12:56   Disposition   Pt is being discharged home today in good condition.  Follow-up Plans & Appointments     Follow-up Information    Bhagat,Bhavinkumar, PA Follow up on 06/02/2016.   Specialty:  Cardiology Why:  at 2:45pm for your follow up appt.  Contact information: [address] [place] [address] [phone]        [place] Follow up on 05/24/2016.   Specialty:  Cardiology Why:  at 1pm to have your INR checked.  Contact information: [address] [place] [phone]         Discharge Instructions    Call MD for:  redness, tenderness, or signs of infection (pain, swelling, redness, odor or green/yellow discharge around incision site)    Complete by:  As directed    Diet - low sodium heart healthy    Complete by:  As directed    Discharge instructions    Complete by:  As directed    Groin Site Care Refer to this sheet in the next few weeks. These  instructions provide you with information on caring for yourself after your procedure. Your caregiver may also give you more specific instructions. Your treatment has been planned according to current medical practices, but problems sometimes occur. Call your caregiver if you have any problems or questions after your procedure. HOME CARE INSTRUCTIONS You may shower 24 hours after the procedure. Remove the bandage (dressing) and gently wash the site with plain soap and water. Gently pat the site dry.  Do not apply powder or lotion to the site.  Do not sit in a bathtub, swimming pool, or whirlpool for 5 to 7 days.  No bending, squatting, or lifting anything over 10 pounds (4.5 kg) as directed by your caregiver.  Inspect the site at least twice daily.  Do not drive home if you are discharged the same day of the procedure. Have someone else drive you.  You may drive 24 hours after the procedure unless otherwise instructed by your caregiver.  What to expect: Any bruising will usually fade within 1 to 2 weeks.  Blood that collects in the tissue (hematoma) may be  painful to the touch. It should usually decrease in size and tenderness within 1 to 2 weeks.  SEEK IMMEDIATE MEDICAL CARE IF: You have unusual pain at the groin site or down the affected leg.  You have redness, warmth, swelling, or pain at the groin site.  You have drainage (other than a small amount of blood on the dressing).  You have chills.  You have a fever or persistent symptoms for more than 72 hours.  You have a fever and your symptoms suddenly get worse.  Your leg becomes pale, cool, tingly, or numb.  You have heavy bleeding from the site. Hold pressure on the site. .  Please take 4mg  coumadin for the next 3 days (until 05/24/16) then you have an appt with the lab at Dr. Elmarie Shiley office to have your INR checked. You also have a follow appt in the office listed. Please call with questions.   Increase activity slowly    Complete by:  As directed       Discharge Medications   Current Discharge Medication List    START taking these medications   Details  lisinopril (PRINIVIL,ZESTRIL) 5 MG tablet Take 1 tablet (5 mg total) by mouth daily. Qty: 30 tablet, Refills: 6      CONTINUE these medications which have CHANGED   Details  isosorbide mononitrate (IMDUR) 60 MG 24 hr tablet Take 2 tablets (120 mg total) by mouth daily. Qty: 135 tablet, Refills: 3    warfarin (COUMADIN) 4 MG tablet Take 0.5-1 tablets (2-4 mg total) by mouth daily. Take 4mg  for the next 3 days (until 05/24/16), then resume regular dosing.   Take 2 mg on Tues / Thurs ** Take 4 mg on Sun / Mon / Wed / Fri / Sat      CONTINUE these medications which have NOT CHANGED   Details  atorvastatin (LIPITOR) 40 MG tablet TAKE ONE TABLET BY MOUTH ONCE DAILY AT  6  PM Qty: 90 tablet, Refills: 2    B Complex-C (B-COMPLEX WITH VITAMIN C) tablet Take 1 tablet by mouth daily.    carvedilol (COREG) 12.5 MG tablet Take 1 tablet (12.5 mg total) by mouth 2 (two) times daily with a meal. Qty: 30 tablet, Refills: 0     Cholecalciferol (VITAMIN D-3 PO) Take 1 tablet by mouth daily.     donepezil (ARICEPT) 10 MG tablet Take 10 mg  by mouth at bedtime.     finasteride (PROSCAR) 5 MG tablet Take 5 mg by mouth at bedtime.     linagliptin (TRADJENTA) 5 MG TABS tablet Take 1 tablet (5 mg total) by mouth daily. For blood sugar Qty: 30 tablet, Refills: 1    nitroGLYCERIN (NITROSTAT) 0.4 MG SL tablet Place 1 tablet (0.4 mg total) under the tongue every 5 (five) minutes as needed for chest pain. Qty: 100 tablet, Refills: 5    Omega-3 Fatty Acids (FISH OIL) 1000 MG CAPS Take 1,000 mg by mouth daily.    RANEXA 1000 MG SR tablet Take 1,000 mg by mouth 2 (two) times daily.           Outstanding Labs/Studies   INR on 1/15  Duration of Discharge Encounter   Greater than 30 minutes including physician time.  Signed, Reino Bellis NP-C 05/21/2016, 12:14 PM

## 2016-05-21 NOTE — Progress Notes (Signed)
Pt meets 24 hours post cath criteria to drive himself home. Pt states that his sister is the only person that can get him; and she has been ill so he doesn't want her to come here, plus his vehicle is here from his arrival at the [facility].

## 2016-05-24 ENCOUNTER — Ambulatory Visit (INDEPENDENT_AMBULATORY_CARE_PROVIDER_SITE_OTHER): Payer: Medicare Other | Admitting: *Deleted

## 2016-05-24 DIAGNOSIS — I480 Paroxysmal atrial fibrillation: Secondary | ICD-10-CM | POA: Diagnosis not present

## 2016-05-24 DIAGNOSIS — Z7901 Long term (current) use of anticoagulants: Secondary | ICD-10-CM

## 2016-05-24 DIAGNOSIS — Z5181 Encounter for therapeutic drug level monitoring: Secondary | ICD-10-CM | POA: Diagnosis not present

## 2016-05-24 LAB — POCT INR: INR: 1.7

## 2016-05-24 NOTE — Telephone Encounter (Signed)
Patient contacted regarding discharge from [facility] on 05/21/16.  Patient understands to follow up with [facility] today at 1:15PM, Vin Bhagat 06/02/16 at [facility]. Patient understands discharge instructions? yes Patient understands medications and regiment? yes Patient understands to bring all medications to this visit? yes

## 2016-05-25 MED FILL — Heparin Sodium (Porcine) 2 Unit/ML in Sodium Chloride 0.9%: INTRAMUSCULAR | Qty: 500 | Status: AC

## 2016-05-27 NOTE — Progress Notes (Signed)
Cardiology Office Note    Date:  06/02/2016   ID:  EDLEY OH, DOB [birth year], MRN [id number]  PCP:  Irven Shelling, MD  Cardiologist:  Dr. [name]  Chief Complaint: Hospital follow up s/p  Cath   History of Present Illness:   [name] is a 83 y.o. male PMH of CAD s/p DES to [place] in 2012, PAF, HTN, HLD, DM, left carotid bruit, lower GI bleed and chronic diastolicCHF presents for hospital follow up.   Seen in clinic by me 05/19/16 for chest pain. He was taking 2-3SL nitro at time with resolution. He requires nitro multiple times in one week. On the day of admission he woke up with a 10 out of 10 substernal chest tightness. In clinic, he had 8/10 chest pain that resolved after 2L nitro x 2. Sent to ER for further evaluation. Troponin was flat. He underwent LHC on 05/20/16 showing widely patent LAD stent with 80% second diag lesion. He was continued on medical therapy and his coumadin resumed. Advised to keep log of his BP. Imdur increased.   Here today for follow-up. He lives by himself. No family members during office visit again today. Patient chest pain has been improving. However, still requiring 2-3 sublingual nitroglycerin in a week. Last took this afternoon. Has intermittent heaviness of head congestion. Questionable medication he is taking. Confirmed with the pharmacy he is regularly feeling all of his medication accept Tradjenta. Last filled up in September.   Past Medical History:  Diagnosis Date  . Aortic stenosis    a. Mild-mod by echo 2013.  . Barrett esophagus   . Chronic anticoagulation   . Chronic systolic CHF (congestive heart failure) (HCC)    a. EF previously 35-40%. b. improved to 55-65% by cath 08/2013.  [name] Colon cancer ([place])    a. 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 b.  "graduated" from their practice 06/2013, remains free of any new disease per those notes.  . Coronary artery disease    a. s/p PCI w/ DES to mLAD 08/05/10. b.  NSTEMI 08/2013 (mildly elev trop) - cath showing widely patent stent, 80% prox D1 (small vessel) with recommendation for medical management, residual 60% ostial PDA, <20% LCx, LVEF 55-65%.   . Depression   . Gallstone pancreatitis 2011   a. s/p chole.  [name] GERD (gastroesophageal reflux disease)   . Hyperlipidemia   . Hypertension   . Macrocytic anemia 06/25/2011  . Myelodysplastic disease ([place])    a. Suspected low-grade  myelodysplastic disease per onc notes.  . Other pancytopenia ([place]) 06/18/2014   Mild, fluctuating, likely med related. Rule out early MDS from prior chemo/RT or colon cancer  . PAF (paroxysmal atrial fibrillation) ([place])    a. failed TEE due to inability to pass probe into the esophagus in March 2013. b. On Coumadin. Was in NSR 08/2013 admission.  . Premature atrial contractions   . Premature ventricular contractions   . SBO (small bowel obstruction) 2005  . Type II diabetes mellitus ([place])     Past Surgical History:  Procedure Laterality Date  . ABDOMINAL MASS RESECTION     Mass near mesentery  . APPENDECTOMY     "took out during colon surgery"  . CARDIAC CATHETERIZATION  08/03/10  . CARDIAC CATHETERIZATION  08/09/10   LAD 30, stent OK, CFX 40, RCA < 20  . CARDIAC CATHETERIZATION N/A 05/20/2016   Procedure: Left Heart Cath and Coronary Angiography;  Surgeon: Lorretta Harp, MD;  Location: [facility];  Service: Cardiovascular;  Laterality: N/A;  . CATARACT EXTRACTION Bilateral   . CHOLECYSTECTOMY  2011  . COLON SURGERY    . CORONARY ANGIOPLASTY WITH STENT PLACEMENT  08/05/10   DES to the LAD  . CYSTOSCOPY     "with removal of kidney stone in office"  . CYSTOSCOPY WITH RETROGRADE PYELOGRAM, URETEROSCOPY AND STENT PLACEMENT Bilateral 05/18/2013   Procedure: CYSTOSCOPY WITH BILATERAL RETROGRADE PYELOGRAM, LEFT URETEROSCOPY AND LEFT STENT PLACEMENT;  Surgeon: Alexis Frock, MD;  Location: [facility];  Service: Urology;  Laterality: Bilateral;  . LEFT HEART CATHETERIZATION  WITH CORONARY ANGIOGRAM N/A 08/15/2013   Procedure: LEFT HEART CATHETERIZATION WITH CORONARY ANGIOGRAM;  Surgeon: Peter M Martinique, MD;  Location: [facility];  Service: Cardiovascular;  Laterality: N/A;  . RIGHT COLECTOMY    . TONSILLECTOMY      Current Medications: Prior to Admission medications   Medication Sig Start Date End Date Taking? Authorizing Provider  atorvastatin (LIPITOR) 40 MG tablet TAKE ONE TABLET BY MOUTH ONCE DAILY AT  6  PM 01/16/16   Thayer Headings, MD  B Complex-C (B-COMPLEX WITH VITAMIN C) tablet Take 1 tablet by mouth daily.    Historical Provider, MD  carvedilol (COREG) 12.5 MG tablet Take 1 tablet (12.5 mg total) by mouth 2 (two) times daily with a meal. Patient taking differently: Take 25 mg by mouth 2 (two) times daily with a meal.  11/04/15   Clanford Marisa Hua, MD  Cholecalciferol (VITAMIN D-3 PO) Take 1 tablet by mouth daily.     Historical Provider, MD  donepezil (ARICEPT) 10 MG tablet Take 10 mg by mouth at bedtime.  05/04/16   Historical Provider, MD  finasteride (PROSCAR) 5 MG tablet Take 5 mg by mouth at bedtime.  04/01/14   Historical Provider, MD  isosorbide mononitrate (IMDUR) 60 MG 24 hr tablet Take 2 tablets (120 mg total) by mouth daily. 05/21/16   Cheryln Manly, NP  linagliptin (TRADJENTA) 5 MG TABS tablet Take 1 tablet (5 mg total) by mouth daily. For blood sugar 11/04/15   Clanford Marisa Hua, MD  lisinopril (PRINIVIL,ZESTRIL) 5 MG tablet Take 1 tablet (5 mg total) by mouth daily. 05/22/16   Cheryln Manly, NP  nitroGLYCERIN (NITROSTAT) 0.4 MG SL tablet Place 1 tablet (0.4 mg total) under the tongue every 5 (five) minutes as needed for chest pain. 07/30/15   Thayer Headings, MD  Omega-3 Fatty Acids (FISH OIL) 1000 MG CAPS Take 1,000 mg by mouth daily.    Historical Provider, MD  RANEXA 1000 MG SR tablet Take 1,000 mg by mouth 2 (two) times daily.  05/10/16   Historical Provider, MD  warfarin (COUMADIN) 4 MG tablet Take 0.5-1 tablets (2-4 mg total) by mouth  daily. Take 4mg  for the next 3 days (until 05/24/16), then resume regular dosing.   Take 2 mg on Tues / Thurs ** Take 4 mg on Sun / Mon / Wed / Fri / Sat 05/21/16   Cheryln Manly, NP    Allergies:   Metformin and related   Social History   Social History  . Marital status: Single    Spouse name: N/A  . Number of children: 0  . Years of education: N/A   Social History Main Topics  . Smoking status: Former Smoker    Packs/day: 1.00    Years: 30.00    Types: Cigarettes    Quit date: 05/10/1994  . Smokeless tobacco: Never Used  .  Alcohol use 1.2 oz/week    2 Shots of liquor per week     Comment: occasionally.  . Drug use: No  . Sexual activity: No   Other Topics Concern  . None   Social History Narrative   Lives alone.       Family History:  The patient's family history includes Cancer in his mother; Heart attack (age of onset: 60) in his brother; Ulcers (age of onset: 67) in his father.   ROS:   Please see the history of present illness.    ROS All other systems reviewed and are negative.   PHYSICAL EXAM:   VS:  BP 140/80 (BP Location: Right Arm, Patient Position: Sitting, Cuff Size: Normal)   Pulse 68   Ht 5\' 9"  (1.753 m)   Wt 155 lb (70.3 kg)   BMI 22.89 kg/m    GEN: Well nourished, well developed, in no acute distress  HEENT: normal  Neck: no JVD, carotid bruits, or masses Cardiac: RRR; no murmurs, rubs, or gallops,no edema  Respiratory:  clear to auscultation bilaterally, normal work of breathing GI: soft, nontender, nondistended, + BS MS: no deformity or atrophy  Skin: warm and dry, no rash Neuro:  Alert and Oriented x 3, Strength and sensation are intact Psych: euthymic mood, full affect  Wt Readings from Last 3 Encounters:  06/02/16 155 lb (70.3 kg)  05/21/16 146 lb (66.2 kg)  05/19/16 153 lb 6.4 oz (69.6 kg)      Studies/Labs Reviewed:   EKG:  EKG is not  ordered today.    Recent Labs: 08/10/2015: B Natriuretic Peptide 223.9 05/20/2016: TSH  2.691 05/21/2016: BUN 17; Creatinine, Ser 1.07; Hemoglobin 11.7; Platelets 149; Potassium 3.8; Sodium 139   Lipid Panel    Component Value Date/Time   CHOL 122 05/20/2016 0101   TRIG 70 05/20/2016 0101   HDL 48 05/20/2016 0101   CHOLHDL 2.5 05/20/2016 0101   VLDL 14 05/20/2016 0101   LDLCALC 60 05/20/2016 0101    Additional studies/ records that were reviewed today include:   Echocardiogram:  LHC: 05/20/16   Conclusion     The left ventricular systolic function is normal.  LV end diastolic pressure is normal.  Prox LAD to Mid LAD lesion, 0 %stenosed.  2nd Diag lesion, 80 %stenosed.  Ost RCA lesion, 50 %stenosed.   IMPRESSION:Mr. Shehee has a widely patent LAD stent with no significant CAD and unchanged anatomy compared to his last cath 3 years ago. He has chronic stable angina possibly related to a small diagonal branch stenosis. His groin was successfully sealed with a minx device and he left the lab in stable condition. He can be discharged home tomorrow and begin Coumadin anticoagulation as an outpatient. He is currently in sinus rhythm but has a history of PAF. His attending cardiologist Dr. Liam Rogers is aware of these results.  Diagnostic Diagram          ASSESSMENT & PLAN:    1. Unstable angina with CAD s/p DES to mLAD in 2012 - Recently required multiple SL nitro with resolution of chest pain. Cath showed widely patent LAD stent, with 80% small diag branch. Medical therapy. Noted hesitancy to place on Plavix given GI bleed in the past and hx of myelodysplastic syndrome. Does have a remote hx of brief GI bleed in 5/16 that resolved, seen by GI with no invasive work up.  - His chest pain has been improving. Continue carvedilol, atorvastatin, lisinopril, Imdurand Ranexa.  2. PAF -  Continue coumadin. Sinus on exam today.   3. HTN - Stable on current medications.   4. HLD -05/20/2016: Cholesterol 122; HDL 48; LDL Cholesterol 60; Triglycerides 70;  VLDL 14  - Continue statin.   5. Social situation - He lives by himself and takes care of his own medication. Confirmed with the pharmacy he is regularly feeling all of his medication accept Tradjenta. Last filled up in September. Will forward note.      Medication Adjustments/Labs and Tests Ordered: Current medicines are reviewed at length with the patient today.  Concerns regarding medicines are outlined above.  Medication changes, Labs and Tests ordered today are listed in the Patient Instructions below. Patient Instructions  Medication Instructions:   Your physician recommends that you continue on your current medications as directed. Please refer to the Current Medication list given to you today.   If you need a refill on your cardiac medications before your next appointment, please call your pharmacy.  Labwork: NONE ORDERED  TODAY    Testing/Procedures: NONE ORDERED  TODAY    Follow-Up:  WITH DR NAHSER IN ONE TO 2 MONTHS    Any Other Special Instructions Will Be Listed Below (If Applicable).                                                                                                                                                      Jarrett Soho, [place]  06/02/2016 3:32 PM    [facility] [place], [place], [place]  [address] Phone: [phone]; Fax: [phone]

## 2016-06-02 ENCOUNTER — Encounter: Payer: Self-pay | Admitting: Physician Assistant

## 2016-06-02 ENCOUNTER — Ambulatory Visit: Payer: Self-pay | Admitting: Cardiology

## 2016-06-02 ENCOUNTER — Ambulatory Visit (INDEPENDENT_AMBULATORY_CARE_PROVIDER_SITE_OTHER): Payer: Medicare Other | Admitting: Physician Assistant

## 2016-06-02 VITALS — BP 140/80 | HR 68 | Ht 69.0 in | Wt 155.0 lb

## 2016-06-02 DIAGNOSIS — I10 Essential (primary) hypertension: Secondary | ICD-10-CM

## 2016-06-02 DIAGNOSIS — Z7901 Long term (current) use of anticoagulants: Secondary | ICD-10-CM | POA: Diagnosis not present

## 2016-06-02 DIAGNOSIS — I25110 Atherosclerotic heart disease of native coronary artery with unstable angina pectoris: Secondary | ICD-10-CM | POA: Diagnosis not present

## 2016-06-02 DIAGNOSIS — I200 Unstable angina: Secondary | ICD-10-CM | POA: Diagnosis not present

## 2016-06-02 DIAGNOSIS — E785 Hyperlipidemia, unspecified: Secondary | ICD-10-CM

## 2016-06-02 DIAGNOSIS — I480 Paroxysmal atrial fibrillation: Secondary | ICD-10-CM

## 2016-06-02 DIAGNOSIS — Z5181 Encounter for therapeutic drug level monitoring: Secondary | ICD-10-CM

## 2016-06-02 NOTE — Patient Instructions (Signed)
Medication Instructions:   Your physician recommends that you continue on your current medications as directed. Please refer to the Current Medication list given to you today.   If you need a refill on your cardiac medications before your next appointment, please call your pharmacy.  Labwork: NONE ORDERED  TODAY    Testing/Procedures: NONE ORDERED  TODAY    Follow-Up:  WITH DR NAHSER IN ONE TO 2 MONTHS    Any Other Special Instructions Will Be Listed Below (If Applicable).

## 2016-06-22 ENCOUNTER — Telehealth: Payer: Self-pay | Admitting: Cardiovascular Disease

## 2016-06-22 NOTE — Telephone Encounter (Signed)
Left detailed message on the confidential voicemail of Janett Billow, RN with [facility] with last vital signs, EF of 40% last measured 7/17 and dx of chronic systolic CHF. I advised her to call back with further questions.

## 2016-06-22 NOTE — Telephone Encounter (Signed)
New Message    Needs Confirmation of diagnosis of CHF and needs recent vital signs and recent echo results please call

## 2016-07-14 ENCOUNTER — Other Ambulatory Visit: Payer: Self-pay | Admitting: Cardiovascular Disease

## 2016-07-15 ENCOUNTER — Other Ambulatory Visit: Payer: Self-pay | Admitting: Nurse Practitioner

## 2016-07-15 NOTE — Telephone Encounter (Signed)
Please advise on what patients current dose of this medication should be. Thanks, MI

## 2016-07-17 ENCOUNTER — Emergency Department (HOSPITAL_COMMUNITY): Payer: Medicare Other

## 2016-07-17 ENCOUNTER — Observation Stay (HOSPITAL_COMMUNITY)
Admission: EM | Admit: 2016-07-17 | Discharge: 2016-07-19 | Disposition: A | Discharge status: Home / Self Care | Payer: Medicare Other | Attending: Internal Medicine | Admitting: Internal Medicine

## 2016-07-17 ENCOUNTER — Encounter (HOSPITAL_COMMUNITY): Payer: Self-pay | Admitting: *Deleted

## 2016-07-17 DIAGNOSIS — I252 Old myocardial infarction: Secondary | ICD-10-CM | POA: Diagnosis not present

## 2016-07-17 DIAGNOSIS — I110 Hypertensive heart disease with heart failure: Secondary | ICD-10-CM | POA: Insufficient documentation

## 2016-07-17 DIAGNOSIS — Z7984 Long term (current) use of oral hypoglycemic drugs: Secondary | ICD-10-CM | POA: Diagnosis not present

## 2016-07-17 DIAGNOSIS — C189 Malignant neoplasm of colon, unspecified: Secondary | ICD-10-CM | POA: Diagnosis present

## 2016-07-17 DIAGNOSIS — I5042 Chronic combined systolic (congestive) and diastolic (congestive) heart failure: Secondary | ICD-10-CM | POA: Insufficient documentation

## 2016-07-17 DIAGNOSIS — I5020 Unspecified systolic (congestive) heart failure: Secondary | ICD-10-CM | POA: Diagnosis present

## 2016-07-17 DIAGNOSIS — C772 Secondary and unspecified malignant neoplasm of intra-abdominal lymph nodes: Secondary | ICD-10-CM

## 2016-07-17 DIAGNOSIS — Z7901 Long term (current) use of anticoagulants: Secondary | ICD-10-CM | POA: Diagnosis not present

## 2016-07-17 DIAGNOSIS — R079 Chest pain, unspecified: Secondary | ICD-10-CM

## 2016-07-17 DIAGNOSIS — I200 Unstable angina: Secondary | ICD-10-CM | POA: Diagnosis not present

## 2016-07-17 DIAGNOSIS — Z87891 Personal history of nicotine dependence: Secondary | ICD-10-CM | POA: Diagnosis not present

## 2016-07-17 DIAGNOSIS — Z85038 Personal history of other malignant neoplasm of large intestine: Secondary | ICD-10-CM | POA: Insufficient documentation

## 2016-07-17 DIAGNOSIS — R0789 Other chest pain: Secondary | ICD-10-CM | POA: Diagnosis present

## 2016-07-17 DIAGNOSIS — E119 Type 2 diabetes mellitus without complications: Secondary | ICD-10-CM

## 2016-07-17 DIAGNOSIS — I25118 Atherosclerotic heart disease of native coronary artery with other forms of angina pectoris: Secondary | ICD-10-CM

## 2016-07-17 DIAGNOSIS — I2510 Atherosclerotic heart disease of native coronary artery without angina pectoris: Secondary | ICD-10-CM | POA: Diagnosis not present

## 2016-07-17 DIAGNOSIS — I480 Paroxysmal atrial fibrillation: Secondary | ICD-10-CM | POA: Diagnosis not present

## 2016-07-17 DIAGNOSIS — Z955 Presence of coronary angioplasty implant and graft: Secondary | ICD-10-CM | POA: Insufficient documentation

## 2016-07-17 DIAGNOSIS — I5022 Chronic systolic (congestive) heart failure: Secondary | ICD-10-CM | POA: Diagnosis not present

## 2016-07-17 DIAGNOSIS — K2270 Barrett's esophagus without dysplasia: Secondary | ICD-10-CM | POA: Diagnosis present

## 2016-07-17 DIAGNOSIS — K22719 Barrett's esophagus with dysplasia, unspecified: Secondary | ICD-10-CM | POA: Diagnosis not present

## 2016-07-17 LAB — BASIC METABOLIC PANEL
ANION GAP: 9 (ref 5–15)
BUN: 24 mg/dL — AB (ref 6–20)
CALCIUM: 8.8 mg/dL — AB (ref 8.9–10.3)
CO2: 23 mmol/L (ref 22–32)
Chloride: 106 mmol/L (ref 101–111)
Creatinine, Ser: 1.14 mg/dL (ref 0.61–1.24)
GFR calc Af Amer: >60 mL/min (ref >60)
GFR, EST NON AFRICAN AMERICAN: 54 mL/min — AB (ref >60)
GLUCOSE: 127 mg/dL — AB (ref 65–99)
POTASSIUM: 4 mmol/L (ref 3.5–5.1)
SODIUM: 138 mmol/L (ref 135–145)

## 2016-07-17 LAB — PROTIME-INR
INR: 3.71
PROTHROMBIN TIME: 37.7 s — AB (ref 11.4–15.2)

## 2016-07-17 LAB — CBC
HEMATOCRIT: 35.6 % — AB (ref 39.0–52.0)
Hemoglobin: 11.9 g/dL — ABNORMAL LOW (ref 13.0–17.0)
MCH: 35.4 pg — ABNORMAL HIGH (ref 26.0–34.0)
MCHC: 33.4 g/dL (ref 30.0–36.0)
MCV: 106 fL — ABNORMAL HIGH (ref 78.0–100.0)
Platelets: 152 10*3/uL (ref 150–400)
RBC: 3.36 MIL/uL — ABNORMAL LOW (ref 4.22–5.81)
RDW: 14 % (ref 11.5–15.5)
WBC: 5.6 10*3/uL (ref 4.0–10.5)

## 2016-07-17 LAB — GLUCOSE, CAPILLARY: GLUCOSE-CAPILLARY: 180 mg/dL — AB (ref 65–99)

## 2016-07-17 LAB — TROPONIN I: TROPONIN I: 0.03 ng/mL — AB (ref <0.03)

## 2016-07-17 LAB — I-STAT TROPONIN, ED
TROPONIN I, POC: 0.01 ng/mL (ref 0.00–0.08)
Troponin i, poc: 0.01 ng/mL (ref 0.00–0.08)

## 2016-07-17 LAB — CBG MONITORING, ED: GLUCOSE-CAPILLARY: 117 mg/dL — AB (ref 65–99)

## 2016-07-17 MED ORDER — WARFARIN - PHARMACIST DOSING INPATIENT: Freq: Every day | Status: DC

## 2016-07-17 MED ORDER — ISOSORBIDE MONONITRATE ER 30 MG PO TB24: 90.0000 mg | ORAL_TABLET | Freq: Every day | ORAL | Status: DC

## 2016-07-17 MED ORDER — PANTOPRAZOLE SODIUM 40 MG PO TBEC
80.0000 mg | DELAYED_RELEASE_TABLET | Freq: Every day | ORAL | Status: DC
  Administered 2016-07-17 – 2016-07-19 (×3): 80 mg via ORAL
  Filled 2016-07-17 (×3): qty 2

## 2016-07-17 MED ORDER — B COMPLEX-C PO TABS
1.0000 | ORAL_TABLET | Freq: Every day | ORAL | Status: DC
  Administered 2016-07-19: 1 via ORAL
  Filled 2016-07-17 (×2): qty 1

## 2016-07-17 MED ORDER — ISOSORBIDE MONONITRATE ER 60 MG PO TB24
120.0000 mg | ORAL_TABLET | Freq: Every day | ORAL | Status: DC
  Administered 2016-07-18 – 2016-07-19 (×2): 120 mg via ORAL
  Filled 2016-07-17 (×3): qty 2

## 2016-07-17 MED ORDER — PANTOPRAZOLE SODIUM 40 MG PO TBEC: 80.0000 mg | DELAYED_RELEASE_TABLET | Freq: Every day | ORAL | Status: DC

## 2016-07-17 MED ORDER — CARVEDILOL 12.5 MG PO TABS
12.5000 mg | ORAL_TABLET | Freq: Two times a day (BID) | ORAL | Status: DC
  Administered 2016-07-18 – 2016-07-19 (×3): 12.5 mg via ORAL
  Filled 2016-07-17 (×3): qty 1

## 2016-07-17 MED ORDER — FINASTERIDE 5 MG PO TABS
5.0000 mg | ORAL_TABLET | Freq: Every day | ORAL | Status: DC
  Administered 2016-07-17 – 2016-07-18 (×2): 5 mg via ORAL
  Filled 2016-07-17 (×2): qty 1

## 2016-07-17 MED ORDER — ISOSORBIDE MONONITRATE ER 30 MG PO TB24: 120.0000 mg | ORAL_TABLET | Freq: Every day | ORAL | Status: DC

## 2016-07-17 MED ORDER — DONEPEZIL HCL 10 MG PO TABS
10.0000 mg | ORAL_TABLET | Freq: Every day | ORAL | Status: DC
  Administered 2016-07-17 – 2016-07-18 (×2): 10 mg via ORAL
  Filled 2016-07-17 (×2): qty 1

## 2016-07-17 MED ORDER — ACETAMINOPHEN 325 MG PO TABS: 650.0000 mg | ORAL_TABLET | ORAL | Status: DC | PRN

## 2016-07-17 MED ORDER — ATORVASTATIN CALCIUM 40 MG PO TABS
40.0000 mg | ORAL_TABLET | Freq: Every day | ORAL | Status: DC
  Administered 2016-07-18: 40 mg via ORAL
  Filled 2016-07-17: qty 1

## 2016-07-17 MED ORDER — LINAGLIPTIN 5 MG PO TABS
5.0000 mg | ORAL_TABLET | Freq: Every day | ORAL | Status: DC
  Administered 2016-07-18 – 2016-07-19 (×2): 5 mg via ORAL
  Filled 2016-07-17 (×2): qty 1

## 2016-07-17 MED ORDER — LISINOPRIL 10 MG PO TABS: 5.0000 mg | ORAL_TABLET | Freq: Every day | ORAL | Status: DC

## 2016-07-17 MED ORDER — GLIMEPIRIDE 1 MG PO TABS
1.0000 mg | ORAL_TABLET | Freq: Every day | ORAL | Status: DC
  Administered 2016-07-18 – 2016-07-19 (×2): 1 mg via ORAL
  Filled 2016-07-17 (×2): qty 1

## 2016-07-17 MED ORDER — ONDANSETRON HCL 4 MG/2ML IJ SOLN: 4.0000 mg | Freq: Four times a day (QID) | INTRAMUSCULAR | Status: DC | PRN

## 2016-07-17 MED ORDER — RANOLAZINE ER 500 MG PO TB12
1000.0000 mg | ORAL_TABLET | Freq: Two times a day (BID) | ORAL | Status: DC
  Administered 2016-07-17 – 2016-07-19 (×4): 1000 mg via ORAL
  Filled 2016-07-17 (×4): qty 2

## 2016-07-17 MED ORDER — NITROGLYCERIN 0.4 MG SL SUBL: 0.4000 mg | SUBLINGUAL_TABLET | SUBLINGUAL | Status: DC | PRN

## 2016-07-17 NOTE — ED Provider Notes (Signed)
The patient is a 83 year old male, he has a known history of coronary disease status post stenting, he had a recent heart catheterization showed a widely patent LAD however there were some other branches that were highly occlusive. Medical management was preferred, the patient is on Coumadin. He presents with having 2 days of worsening chest pain, taking nitroglycerin with improvement. On exam the patient has clear heart and lung sounds, there is some slight irregularity to his heart beat, he has good pulses, scant if any edema, no JVD. Abdomen is soft, EKG is abnormal but seems to be at baseline with right bundle branch block with PVCs.  Will initiate cardiac workup, there is always concern for increased ischemia with the patient has increasing symptoms with his known heart disease. Anticipate admission.  ED ECG REPORT  I personally interpreted this EKG   Date: 07/17/2016   Rate: 77  Rhythm: normal sinus rhythm  QRS Axis: normal  Intervals: normal  ST/T Wave abnormalities: nonspecific ST/T changes  Conduction Disutrbances:right bundle branch block  Narrative Interpretation: has some PVCs  Old EKG Reviewed: none available   I saw and evaluated the patient, reviewed the resident's note and I agree with the findings and plan.   I personally interpreted the EKG as well as the resident and agree with the interpretation on the resident's chart.  Final diagnoses:  Chest pain, unspecified type      Noemi Chapel, MD 07/18/16 1007

## 2016-07-17 NOTE — ED Notes (Signed)
Pt aware that urine sample is needed, but is unable to provide one at this time 

## 2016-07-17 NOTE — ED Provider Notes (Signed)
[facility] DEPT Provider Note   CSN: [id number] Arrival date & time: 07/17/16  1649     History   Chief Complaint No chief complaint on file.   HPI [name] is a 83 y.o. male.  HPI   83 year old male with history of CAD status post PCI with DES to the LAD in 2012, NSTEMI in 2015, and recent cath a couple weeks ago showing widely patent LAD stent but 80% stenosis to the second diag and 50% stenosis to the RCA, who presents with chest tightness. Patient reports having intermittent chest tightness for years, but states that over the past couple of days he's had more frequent pain that has been of greater intensity. States that he has required four sublingual nitroglycerin today, which is very unusual for him. Pain does resolve with nitroglycerin. When the pain is at its worst he reports a feeling of lightheadedness. Denies any shortness of breath, nausea, vomiting, or diaphoresis. Last nitroglycerin was taken at around 2 PM this afternoon. Patient denies exertional or pleuritic component to his pain. Pain does not radiate to the back, jaw, or arms. He also has a history of paroxysmal A. fib and is on Coumadin. No other aggravating or relieving factors or associated symptoms. He is currently pain-free.  Past Medical History:  Diagnosis Date  . Aortic stenosis    a. Mild-mod by echo 2013.  . Barrett esophagus   . Chronic anticoagulation   . Chronic systolic CHF (congestive heart failure) (HCC)    a. EF previously 35-40%. b. improved to 55-65% by cath 08/2013.  [name] Colon cancer ([place])    a. 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 b.  "graduated" from their practice 06/2013, remains free of any new disease per those notes.  . Coronary artery disease    a. s/p PCI w/ DES to mLAD 08/05/10. b. NSTEMI 08/2013 (mildly elev trop) - cath showing widely patent stent, 80% prox D1 (small vessel) with recommendation for medical management, residual 60% ostial PDA, <20% LCx, LVEF 55-65%.    . Depression   . Gallstone pancreatitis 2011   a. s/p chole.  [name] GERD (gastroesophageal reflux disease)   . Hyperlipidemia   . Hypertension   . Macrocytic anemia 06/25/2011  . Myelodysplastic disease ([place])    a. Suspected low-grade  myelodysplastic disease per onc notes.  . Other pancytopenia ([place]) 06/18/2014   Mild, fluctuating, likely med related. Rule out early MDS from prior chemo/RT or colon cancer  . PAF (paroxysmal atrial fibrillation) ([place])    a. failed TEE due to inability to pass probe into the esophagus in March 2013. b. On Coumadin. Was in NSR 08/2013 admission.  . Premature atrial contractions   . Premature ventricular contractions   . SBO (small bowel obstruction) 2005  . Type II diabetes mellitus [facility])     Patient Active Problem List   Diagnosis Date Noted  . Barrett esophagus 07/17/2016  . Encounter for therapeutic drug monitoring 05/24/2016  . Malnutrition of moderate degree 05/20/2016  . Hypertensive heart disease without heart failure   . Pure hypercholesterolemia   . Chronic diastolic heart failure ([place])   . Near syncope 11/03/2015  . Chest discomfort 11/03/2015  . Transient Hypotension 11/03/2015  . Generalized weakness 11/03/2015  . Supratherapeutic INR 11/03/2015  . Hyperglycemia, unspecified 11/03/2015  . Anemia, unspecified 11/03/2015  . Chronic anticoagulation 11/03/2015  . Altered awareness, transient 11/03/2015  . Thrombocytopenia ([place]) 08/10/2015  . Non-insulin dependent type 2 diabetes mellitus ([place])   .  Pain in the chest   . History of colon cancer, stage III 09/11/2014  . BRBPR (bright red blood per rectum) 09/11/2014  . Other pancytopenia ([place]) 06/18/2014  . Chronic systolic CHF (congestive heart failure) ([place]) 06/07/2014  . NSTEMI (non-ST elevated myocardial infarction) ([place]) 08/15/2013  . Chest pain 12/14/2012  . 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 Paroxysmal atrial fibrillation ([place]) 07/20/2011  .  Cardiomyopathy ([place]) 07/20/2011  . SOB (shortness of breath) 07/17/2011  . Macrocytic anemia 06/25/2011  . Bruising 09/18/2010  . Atherosclerosis of native coronary artery of native heart with stable angina pectoris ([place]) 08/13/2010  . Hypertension   . Hyperlipidemia     Past Surgical History:  Procedure Laterality Date  . ABDOMINAL MASS RESECTION     Mass near mesentery  . APPENDECTOMY     "took out during colon surgery"  . CARDIAC CATHETERIZATION  08/03/10  . CARDIAC CATHETERIZATION  08/09/10   LAD 30, stent OK, CFX 40, RCA < 20  . CARDIAC CATHETERIZATION N/A 05/20/2016   Procedure: Left Heart Cath and Coronary Angiography;  Surgeon: Lorretta Harp, MD;  Location: [facility] CV LAB;  Service: Cardiovascular;  Laterality: N/A;  . CATARACT EXTRACTION Bilateral   . CHOLECYSTECTOMY  2011  . COLON SURGERY    . CORONARY ANGIOPLASTY WITH STENT PLACEMENT  08/05/10   DES to the LAD  . CYSTOSCOPY     "with removal of kidney stone in office"  . CYSTOSCOPY WITH RETROGRADE PYELOGRAM, URETEROSCOPY AND STENT PLACEMENT Bilateral 05/18/2013   Procedure: CYSTOSCOPY WITH BILATERAL RETROGRADE PYELOGRAM, LEFT URETEROSCOPY AND LEFT STENT PLACEMENT;  Surgeon: Alexis Frock, MD;  Location: [facility] ORS;  Service: Urology;  Laterality: Bilateral;  . LEFT HEART CATHETERIZATION WITH CORONARY ANGIOGRAM N/A 08/15/2013   Procedure: LEFT HEART CATHETERIZATION WITH CORONARY ANGIOGRAM;  Surgeon: Peter M Martinique, MD;  Location: [facility] CATH LAB;  Service: Cardiovascular;  Laterality: N/A;  . RIGHT COLECTOMY    . TONSILLECTOMY         Home Medications    Prior to Admission medications   Medication Sig Start Date End Date Taking? Authorizing Provider  atorvastatin (LIPITOR) 40 MG tablet TAKE ONE TABLET BY MOUTH ONCE DAILY AT  6  PM Patient taking differently: TAKE ONE TABLET BY MOUTH ONCE DAILY 01/16/16  Yes Thayer Headings, MD  B Complex-C (B-COMPLEX WITH VITAMIN C) tablet Take 1 tablet by mouth daily.   Yes Historical  Provider, MD  Cholecalciferol (VITAMIN D-3 PO) Take 1 tablet by mouth daily.    Yes Historical Provider, MD  donepezil (ARICEPT) 10 MG tablet Take 10 mg by mouth at bedtime.  05/04/16  Yes Historical Provider, MD  finasteride (PROSCAR) 5 MG tablet Take 5 mg by mouth at bedtime.  04/01/14  Yes Historical Provider, MD  glimepiride (AMARYL) 1 MG tablet Take 1 mg by mouth daily.  05/29/16  Yes Historical Provider, MD  isosorbide mononitrate (IMDUR) 60 MG 24 hr tablet Take 2 tablets (120 mg total) by mouth daily. Patient taking differently: Take 90 mg by mouth daily.  05/21/16  Yes Cheryln Manly, NP  linagliptin (TRADJENTA) 5 MG TABS tablet Take 1 tablet (5 mg total) by mouth daily. For blood sugar 11/04/15  Yes [place] Marisa Hua, MD  nitroGLYCERIN (NITROSTAT) 0.4 MG SL tablet Place 1 tablet (0.4 mg total) under the tongue every 5 (five) minutes as needed for chest pain. 07/30/15  Yes Thayer Headings, MD  OMEPRAZOLE PO Take 1 capsule by  mouth daily.   Yes Historical Provider, MD  OVER THE COUNTER MEDICATION Place 1 drop into both eyes 2 (two) times daily as needed (dry eyes).   Yes Historical Provider, MD  ranolazine (RANEXA) 1000 MG SR tablet Take 1,000 mg by mouth 2 (two) times daily.    Yes Historical Provider, MD  warfarin (COUMADIN) 4 MG tablet Take 0.5-1 tablets (2-4 mg total) by mouth daily. Take 4mg  for the next 3 days (until 05/24/16), then resume regular dosing.   Take 2 mg on Tues / Thurs ** Take 4 mg on Sun / Mon / Wed / Fri / Sat Patient taking differently: Take 4 mg by mouth daily after breakfast.  05/21/16  Yes Cheryln Manly, NP  carvedilol (COREG) 25 MG tablet Take 0.5 tablets (12.5 mg total) by mouth 2 (two) times daily with a meal. 07/15/16 07/15/17  Thayer Headings, MD  lisinopril (PRINIVIL,ZESTRIL) 5 MG tablet Take 1 tablet (5 mg total) by mouth daily. 05/22/16   Cheryln Manly, NP    Family History Family History  Problem Relation Age of Onset  . Cancer Mother   . Ulcers  Father 60  . Heart attack Brother 5    Social History Social History  Substance Use Topics  . Smoking status: Former Smoker    Packs/day: 1.00    Years: 30.00    Types: Cigarettes    Quit date: 05/10/1994  . Smokeless tobacco: Never Used  . Alcohol use 1.2 oz/week    2 Shots of liquor per week     Comment: occasionally.     Allergies   Metformin and related   Review of Systems Review of Systems  Constitutional: Negative for chills and fever.  HENT: Negative for congestion.   Eyes: Negative for visual disturbance.  Respiratory: Negative for cough and shortness of breath.   Cardiovascular: Positive for chest pain (resolved). Negative for palpitations.  Gastrointestinal: Negative for abdominal pain, nausea and vomiting.  Genitourinary: Negative for dysuria and hematuria.  Musculoskeletal: Negative for arthralgias and back pain.  Skin: Negative for color change and rash.  Neurological: Positive for light-headedness (resolved). Negative for seizures, syncope, weakness and headaches.  Psychiatric/Behavioral: Negative for agitation, behavioral problems and confusion.  All other systems reviewed and are negative.    Physical Exam Updated Vital Signs BP (!) 155/80   Pulse 73   Temp 98.1 F (36.7 C) (Oral)   Resp 16   Ht 5\' 9"  (1.753 m)   Wt 56.9 kg   SpO2 95%   BMI 18.52 kg/m   Physical Exam  Constitutional: He is oriented to person, place, and time. He appears well-developed and well-nourished. No distress.  HENT:  Head: Normocephalic and atraumatic.  Right Ear: External ear normal.  Left Ear: External ear normal.  Nose: Nose normal.  Mouth/Throat: Oropharynx is clear and moist. No oropharyngeal exudate.  Eyes: Conjunctivae are normal. Pupils are equal, round, and reactive to light. Right eye exhibits no discharge. Left eye exhibits no discharge. No scleral icterus.  Neck: Normal range of motion. Neck supple.  Cardiovascular: Normal rate, regular rhythm, normal  heart sounds and intact distal pulses.  Exam reveals no gallop and no friction rub.   No murmur heard. Pulmonary/Chest: Effort normal and breath sounds normal. No respiratory distress. He has no wheezes. He has no rales.  Abdominal: Soft. Bowel sounds are normal. He exhibits no distension. There is no tenderness. There is no guarding.  Musculoskeletal: Normal range of motion. He exhibits no  edema or tenderness.  Neurological: He is alert and oriented to person, place, and time. He exhibits normal muscle tone.  Skin: Skin is warm and dry. No rash noted. He is not diaphoretic.  Psychiatric: He has a normal mood and affect. His behavior is normal. Judgment and thought content normal.     ED Treatments / Results  Labs (all labs ordered are listed, but only abnormal results are displayed) Labs Reviewed  BASIC METABOLIC PANEL - Abnormal; Notable for the following:       Result Value   Glucose, Bld 127 (*)    BUN 24 (*)    Calcium 8.8 (*)    GFR calc non Af Amer 54 (*)    All other components within normal limits  CBC - Abnormal; Notable for the following:    RBC 3.36 (*)    Hemoglobin 11.9 (*)    HCT 35.6 (*)    MCV 106.0 (*)    MCH 35.4 (*)    All other components within normal limits  TROPONIN I - Abnormal; Notable for the following:    Troponin I 0.03 (*)    All other components within normal limits  PROTIME-INR - Abnormal; Notable for the following:    Prothrombin Time 37.7 (*)    All other components within normal limits  GLUCOSE, CAPILLARY - Abnormal; Notable for the following:    Glucose-Capillary 180 (*)    All other components within normal limits  CBG MONITORING, ED - Abnormal; Notable for the following:    Glucose-Capillary 117 (*)    All other components within normal limits  URINALYSIS, ROUTINE W REFLEX MICROSCOPIC  TROPONIN I  TROPONIN I  PROTIME-INR  I-STAT TROPOININ, ED  I-STAT TROPOININ, ED    EKG  EKG Interpretation None       Radiology Dg Chest 2  View  Result Date: 07/17/2016 CLINICAL DATA:  Chest pain EXAM: CHEST  2 VIEW COMPARISON:  Chest radiograph 05/19/2016 FINDINGS: Mild cardiomegaly and aortic atherosclerosis are unchanged. Bilateral multifocal pulmonary opacities are again seen, but the degree of opacity in the right upper lobe has worsened. There is no pneumothorax or sizable pleural effusion. No acute osseous abnormality. IMPRESSION: Increased opacities in the right upper lobe superimposed on a background of chronic parenchymal lung disease may indicate developing consolidation, including the possibility of pneumonia. Unchanged cardiomegaly and calcific aortic atherosclerosis. Electronically Signed   By: Ulyses Jarred M.D.   On: 07/17/2016 17:49    Procedures Procedures (including critical care time)  Medications Ordered in ED Medications  atorvastatin (LIPITOR) tablet 40 mg (not administered)  B-complex with vitamin C tablet 1 tablet (not administered)  carvedilol (COREG) tablet 12.5 mg (not administered)  donepezil (ARICEPT) tablet 10 mg (10 mg Oral Given 07/17/16 2137)  finasteride (PROSCAR) tablet 5 mg (5 mg Oral Given 07/17/16 2137)  glimepiride (AMARYL) tablet 1 mg (not administered)  linagliptin (TRADJENTA) tablet 5 mg (not administered)  nitroGLYCERIN (NITROSTAT) SL tablet 0.4 mg (not administered)  ranolazine (RANEXA) 12 hr tablet 1,000 mg (1,000 mg Oral Given 07/17/16 2136)  Warfarin - Pharmacist Dosing Inpatient (not administered)  acetaminophen (TYLENOL) tablet 650 mg (not administered)  ondansetron (ZOFRAN) injection 4 mg (not administered)  isosorbide mononitrate (IMDUR) 24 hr tablet 120 mg (not administered)  pantoprazole (PROTONIX) EC tablet 80 mg (80 mg Oral Given 07/17/16 2145)     Initial Impression / Assessment and Plan / ED Course  I have reviewed the triage vital signs and the nursing notes.  Pertinent  labs & imaging results that were available during my care of the patient were reviewed by me and  considered in my medical decision making (see chart for details).     Generally well-appearing. Afebrile and HDS. EKG with RBBB, sinus rhythm, unchanged from prior. In the absence of cough, fever, or dyspnea, CXR is not concerning for PNA. First trop is 0.01.   Given multiple comorbidities and known history of CAD, increasing severity and frequency of pain, will admit to medicine for high risk CP rule out. Delta troponin ordered. Pt admitted to medicine in stable condition.   Care of pt overseen by my attending, Dr. Sabra Heck.   Final Clinical Impressions(s) / ED Diagnoses   Final diagnoses:  Chest pain, unspecified type    New Prescriptions Current Discharge Medication List       Zipporah Plants, MD 07/18/16 0023    Noemi Chapel, MD 07/18/16 1007

## 2016-07-17 NOTE — H&P (Signed)
History and Physical    [name] ZDG:[id number] DOB: 1923-06-07 DOA: 07/17/2016   PCP: Irven Shelling, MD Chief Complaint: No chief complaint on file.   HPI: [name] is a 83 y.o. male with medical history significant of CAD s/p DES to mLAD in 2012, NSTEMI 2015, cath shows widely patent stent, 80% prox D1, 50% ost PDA.  Admitted in Jan this year with chest pain, Cath performed which was unchanged, felt to be stable angina from the 80% lesion.  Patient presents to the ED with 2 day history of worsening chest pain.  Taking NTG with improvement, but the amount of NTG he has been taking is unusual for him so he presents to the ED.  CP is left sided, worsening, resolves with NTG, no associated SOB, N/V.  Sounds like patient didn't end up filling new scripts after Jan discharge, and meds may be different than initial med rec: 1) still taking 90 imdur not 120 (script got written Jan 12th discharge). 2) hasnt filled lisinopril since July last year 3) we think hes taking a full coreg BID instead of a half BID 4) is taking omeprazole, no fill records though  ED Course: CP resolved with NTG.  Trop neg.  Review of Systems: As per HPI otherwise 10 point review of systems negative.    Past Medical History:  Diagnosis Date  . Aortic stenosis    a. Mild-mod by echo 2013.  . Barrett esophagus   . Chronic anticoagulation   . Chronic systolic CHF (congestive heart failure) (HCC)    a. EF previously 35-40%. b. improved to 55-65% by cath 08/2013.  [name] Colon cancer ([place])    a. 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 b.  "graduated" from their practice 06/2013, remains free of any new disease per those notes.  . Coronary artery disease    a. s/p PCI w/ DES to mLAD 08/05/10. b. NSTEMI 08/2013 (mildly elev trop) - cath showing widely patent stent, 80% prox D1 (small vessel) with recommendation for medical management, residual 60% ostial PDA, <20% LCx, LVEF 55-65%.   .  Depression   . Gallstone pancreatitis 2011   a. s/p chole.  [name] GERD (gastroesophageal reflux disease)   . Hyperlipidemia   . Hypertension   . Macrocytic anemia 06/25/2011  . Myelodysplastic disease ([place])    a. Suspected low-grade  myelodysplastic disease per onc notes.  . Other pancytopenia ([place]) 06/18/2014   Mild, fluctuating, likely med related. Rule out early MDS from prior chemo/RT or colon cancer  . PAF (paroxysmal atrial fibrillation) ([place])    a. failed TEE due to inability to pass probe into the esophagus in March 2013. b. On Coumadin. Was in NSR 08/2013 admission.  . Premature atrial contractions   . Premature ventricular contractions   . SBO (small bowel obstruction) 2005  . Type II diabetes mellitus ([place])     Past Surgical History:  Procedure Laterality Date  . ABDOMINAL MASS RESECTION     Mass near mesentery  . APPENDECTOMY     "took out during colon surgery"  . CARDIAC CATHETERIZATION  08/03/10  . CARDIAC CATHETERIZATION  08/09/10   LAD 30, stent OK, CFX 40, RCA < 20  . CARDIAC CATHETERIZATION N/A 05/20/2016   Procedure: Left Heart Cath and Coronary Angiography;  Surgeon: Lorretta Harp, MD;  Location: [facility];  Service: Cardiovascular;  Laterality: N/A;  . CATARACT EXTRACTION Bilateral   . CHOLECYSTECTOMY  2011  . COLON SURGERY    .  CORONARY ANGIOPLASTY WITH STENT PLACEMENT  08/05/10   DES to the LAD  . CYSTOSCOPY     "with removal of kidney stone in office"  . CYSTOSCOPY WITH RETROGRADE PYELOGRAM, URETEROSCOPY AND STENT PLACEMENT Bilateral 05/18/2013   Procedure: CYSTOSCOPY WITH BILATERAL RETROGRADE PYELOGRAM, LEFT URETEROSCOPY AND LEFT STENT PLACEMENT;  Surgeon: Alexis Frock, MD;  Location: [facility];  Service: Urology;  Laterality: Bilateral;  . LEFT HEART CATHETERIZATION WITH CORONARY ANGIOGRAM N/A 08/15/2013   Procedure: LEFT HEART CATHETERIZATION WITH CORONARY ANGIOGRAM;  Surgeon: Peter M Martinique, MD;  Location: [facility];  Service: Cardiovascular;   Laterality: N/A;  . RIGHT COLECTOMY    . TONSILLECTOMY       reports that he quit smoking about 22 years ago. His smoking use included Cigarettes. He has a 30.00 pack-year smoking history. He has never used smokeless tobacco. He reports that he drinks about 1.2 oz of alcohol per week . He reports that he does not use drugs.  Allergies  Allergen Reactions  . Metformin And Related Nausea And Vomiting    Family History  Problem Relation Age of Onset  . Cancer Mother   . Ulcers Father 19  . Heart attack Brother 41      Prior to Admission medications   Medication Sig Start Date End Date Taking? Authorizing Provider  atorvastatin (LIPITOR) 40 MG tablet TAKE ONE TABLET BY MOUTH ONCE DAILY AT  6  PM Patient taking differently: TAKE ONE TABLET BY MOUTH ONCE DAILY 01/16/16  Yes Thayer Headings, MD  B Complex-C (B-COMPLEX WITH VITAMIN C) tablet Take 1 tablet by mouth daily.   Yes Historical Provider, MD  Cholecalciferol (VITAMIN D-3 PO) Take 1 tablet by mouth daily.    Yes Historical Provider, MD  donepezil (ARICEPT) 10 MG tablet Take 10 mg by mouth at bedtime.  05/04/16  Yes Historical Provider, MD  finasteride (PROSCAR) 5 MG tablet Take 5 mg by mouth at bedtime.  04/01/14  Yes Historical Provider, MD  glimepiride (AMARYL) 1 MG tablet Take 1 mg by mouth daily.  05/29/16  Yes Historical Provider, MD  isosorbide mononitrate (IMDUR) 60 MG 24 hr tablet Take 2 tablets (120 mg total) by mouth daily. Patient taking differently: Take 90 mg by mouth daily.  05/21/16  Yes Cheryln Manly, NP  linagliptin (TRADJENTA) 5 MG TABS tablet Take 1 tablet (5 mg total) by mouth daily. For blood sugar 11/04/15  Yes [place] Marisa Hua, MD  nitroGLYCERIN (NITROSTAT) 0.4 MG SL tablet Place 1 tablet (0.4 mg total) under the tongue every 5 (five) minutes as needed for chest pain. 07/30/15  Yes Thayer Headings, MD  OMEPRAZOLE PO Take 1 capsule by mouth daily.   Yes Historical Provider, MD  OVER THE COUNTER MEDICATION Place  1 drop into both eyes 2 (two) times daily as needed (dry eyes).   Yes Historical Provider, MD  ranolazine (RANEXA) 1000 MG SR tablet Take 1,000 mg by mouth 2 (two) times daily.    Yes Historical Provider, MD  warfarin (COUMADIN) 4 MG tablet Take 0.5-1 tablets (2-4 mg total) by mouth daily. Take 4mg  for the next 3 days (until 05/24/16), then resume regular dosing.   Take 2 mg on Tues / Thurs ** Take 4 mg on Sun / Mon / Wed / Fri / Sat Patient taking differently: Take 4 mg by mouth daily after breakfast.  05/21/16  Yes Cheryln Manly, NP  carvedilol (COREG) 25 MG tablet Take 0.5 tablets (12.5 mg total) by  mouth 2 (two) times daily with a meal. 07/15/16 07/15/17  Thayer Headings, MD  lisinopril (PRINIVIL,ZESTRIL) 5 MG tablet Take 1 tablet (5 mg total) by mouth daily. 05/22/16   Cheryln Manly, NP    Physical Exam: Vitals:   07/17/16 1845 07/17/16 1853 07/17/16 1930  BP: 119/71 119/71 149/71  Pulse: 76 61 67  Resp: 19 21 14   SpO2: 95% 97% 97%      Constitutional: NAD, calm, comfortable Eyes: PERRL, lids and conjunctivae normal ENMT: Mucous membranes are moist. Posterior pharynx clear of any exudate or lesions.Normal dentition.  Neck: normal, supple, no masses, no thyromegaly Respiratory: clear to auscultation bilaterally, no wheezing, no crackles. Normal respiratory effort. No accessory muscle use.  Cardiovascular: Regular rate and rhythm, no murmurs / rubs / gallops. No extremity edema. 2+ pedal pulses. No carotid bruits.  Abdomen: no tenderness, no masses palpated. No hepatosplenomegaly. Bowel sounds positive.  Musculoskeletal: no clubbing / cyanosis. No joint deformity upper and lower extremities. Good ROM, no contractures. Normal muscle tone.  Skin: no rashes, lesions, ulcers. No induration Neurologic: CN 2-12 grossly intact. Sensation intact, DTR normal. Strength 5/5 in all 4.  Psychiatric: Normal judgment and insight. Alert and oriented x 3. Normal mood.    Labs on Admission: I  have personally reviewed following labs and imaging studies  CBC:  Recent Labs Lab 07/17/16 1716  WBC 5.6  HGB 11.9*  HCT 35.6*  MCV 106.0*  PLT 071   Basic Metabolic Panel:  Recent Labs Lab 07/17/16 1716  NA 138  K 4.0  CL 106  CO2 23  GLUCOSE 127*  BUN 24*  CREATININE 1.14  CALCIUM 8.8*   GFR: CrCl cannot be calculated (Unknown ideal weight.). Liver Function Tests: No results for input(s): AST, ALT, ALKPHOS, BILITOT, PROT, ALBUMIN in the last 168 hours. No results for input(s): LIPASE, AMYLASE in the last 168 hours. No results for input(s): AMMONIA in the last 168 hours. Coagulation Profile: No results for input(s): INR, PROTIME in the last 168 hours. Cardiac Enzymes: No results for input(s): CKTOTAL, CKMB, CKMBINDEX, TROPONINI in the last 168 hours. BNP (last 3 results) No results for input(s): PROBNP in the last 8760 hours. HbA1C: No results for input(s): HGBA1C in the last 72 hours. CBG:  Recent Labs Lab 07/17/16 1720  GLUCAP 117*   Lipid Profile: No results for input(s): CHOL, HDL, LDLCALC, TRIG, CHOLHDL, LDLDIRECT in the last 72 hours. Thyroid Function Tests: No results for input(s): TSH, T4TOTAL, FREET4, T3FREE, THYROIDAB in the last 72 hours. Anemia Panel: No results for input(s): VITAMINB12, FOLATE, FERRITIN, TIBC, IRON, RETICCTPCT in the last 72 hours. Urine analysis:    Component Value Date/Time   COLORURINE YELLOW 11/04/2015 0059   APPEARANCEUR CLOUDY (A) 11/04/2015 0059   LABSPEC 1.018 11/04/2015 0059   PHURINE 5.5 11/04/2015 0059   GLUCOSEU 100 (A) 11/04/2015 0059   HGBUR NEGATIVE 11/04/2015 0059   BILIRUBINUR NEGATIVE 11/04/2015 0059   KETONESUR NEGATIVE 11/04/2015 0059   PROTEINUR NEGATIVE 11/04/2015 0059   UROBILINOGEN 0.2 06/07/2014 1924   NITRITE NEGATIVE 11/04/2015 0059   LEUKOCYTESUR NEGATIVE 11/04/2015 0059   Sepsis Labs: @LABRCNTIP (procalcitonin:4,lacticidven:4) )No results found for this or any previous visit (from the  past 240 hour(s)).   Radiological Exams on Admission: Dg Chest 2 View  Result Date: 07/17/2016 CLINICAL DATA:  Chest pain EXAM: CHEST  2 VIEW COMPARISON:  Chest radiograph 05/19/2016 FINDINGS: Mild cardiomegaly and aortic atherosclerosis are unchanged. Bilateral multifocal pulmonary opacities are again seen, but the degree  of opacity in the right upper lobe has worsened. There is no pneumothorax or sizable pleural effusion. No acute osseous abnormality. IMPRESSION: Increased opacities in the right upper lobe superimposed on a background of chronic parenchymal lung disease may indicate developing consolidation, including the possibility of pneumonia. Unchanged cardiomegaly and calcific aortic atherosclerosis. Electronically Signed   By: Ulyses Jarred M.D.   On: 07/17/2016 17:49    EKG: Independently reviewed.  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 CP - probably stable angina from the 80% D1 lesion 1. Putting patient back on discharge meds from Jan 12th (apparently had been taking Imdur 90, not taking Lisinopril since mid year last year, and taking Coreg 25 BID instead of 12.5). 2. CP obs pathway 3. Serial trops 4. Tele monitor 5. Will go ahead and let him eat as I am doubtful that unless trop elevates that cards will re-cath him (I assume if they could have stented the 80% lesion they would have). 2. PAF - 1. Continue coumadin 2. Continue coreg but at 12.5 BID for now 3. DM2 - 1. Continue home PO hypoglycemics 4. Barrett's esophagus - 1. Start / continue PPI (he says he takes nexium but it isnt on his  list, he does need to be on a PPI with this history though). 5. Colon Cancer mets to lymph nodes - "graduated" from oncology surveillance in 2015 6. CHF - continue home meds as above   DVT prophylaxis: Coumadin Code Status: Full Family Communication: No family in room Consults called: None Admission status: Place in 25, [place] [facility] Pager [phone] from 7PM-7AM  If 7AM-7PM, please contact the day physician for the patient [email or web address] Password [email or web address]  07/17/2016, 8:15 PM

## 2016-07-17 NOTE — Progress Notes (Addendum)
[name] for Warfarin Indication: atrial fibrillation  Allergies  Allergen Reactions  . Metformin And Related Nausea And Vomiting    Patient Measurements: Height: 5\' 9"  (175.3 cm) Weight: 125 lb 6.4 oz (56.9 kg) IBW/kg (Calculated) : 70.7  Vital Signs: Temp: 98.1 F (36.7 C) (03/10 2055) Temp Source: Oral (03/10 2055) BP: 155/80 (03/10 2055) Pulse Rate: 73 (03/10 2055)  Labs:  Recent Labs  07/17/16 1716 07/17/16 2021  HGB 11.9*  --   HCT 35.6*  --   PLT 152  --   LABPROT  --  37.7*  INR  --  3.71  CREATININE 1.14  --   TROPONINI  --  0.03*    Estimated Creatinine Clearance: 34 mL/min (by C-G formula based on SCr of 1.14 mg/dL).   Medical History: Past Medical History:  Diagnosis Date  . Aortic stenosis    a. Mild-mod by echo 2013.  . Barrett esophagus   . Chronic anticoagulation   . Chronic systolic CHF (congestive heart failure) (HCC)    a. EF previously 35-40%. b. improved to 55-65% by cath 08/2013.  [name] Colon cancer ([place])    a. 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 b.  "graduated" from their practice 06/2013, remains free of any new disease per those notes.  . Coronary artery disease    a. s/p PCI w/ DES to mLAD 08/05/10. b. NSTEMI 08/2013 (mildly elev trop) - cath showing widely patent stent, 80% prox D1 (small vessel) with recommendation for medical management, residual 60% ostial PDA, <20% LCx, LVEF 55-65%.   . Depression   . Gallstone pancreatitis 2011   a. s/p chole.  [name] GERD (gastroesophageal reflux disease)   . Hyperlipidemia   . Hypertension   . Macrocytic anemia 06/25/2011  . Myelodysplastic disease ([place])    a. Suspected low-grade  myelodysplastic disease per onc notes.  . Other pancytopenia ([place]) 06/18/2014   Mild, fluctuating, likely med related. Rule out early MDS from prior chemo/RT or colon cancer  . PAF (paroxysmal atrial fibrillation) ([place])    a. failed TEE due to inability to pass probe into  the esophagus in March 2013. b. On Coumadin. Was in NSR 08/2013 admission.  . Premature atrial contractions   . Premature ventricular contractions   . SBO (small bowel obstruction) 2005  . Type II diabetes mellitus (HCC)     Medications:  Prescriptions Prior to Admission  Medication Sig Dispense Refill Last Dose  . atorvastatin (LIPITOR) 40 MG tablet TAKE ONE TABLET BY MOUTH ONCE DAILY AT  6  PM (Patient taking differently: TAKE ONE TABLET BY MOUTH ONCE DAILY) 90 tablet 2 07/17/2016 at noon  . B Complex-C (B-COMPLEX WITH VITAMIN C) tablet Take 1 tablet by mouth daily.   07/17/2016 at Unknown time  . Cholecalciferol (VITAMIN D-3 PO) Take 1 tablet by mouth daily.    07/17/2016 at Unknown time  . donepezil (ARICEPT) 10 MG tablet Take 10 mg by mouth at bedtime.    07/16/2016 at Unknown time  . finasteride (PROSCAR) 5 MG tablet Take 5 mg by mouth at bedtime.    07/16/2016 at Unknown time  . glimepiride (AMARYL) 1 MG tablet Take 1 mg by mouth daily.    07/17/2016 at noon  . isosorbide mononitrate (IMDUR) 60 MG 24 hr tablet Take 2 tablets (120 mg total) by mouth daily. (Patient taking differently: Take 90 mg by mouth daily. ) 135 tablet 3 07/17/2016 at noon  . linagliptin (TRADJENTA) 5  MG TABS tablet Take 1 tablet (5 mg total) by mouth daily. For blood sugar 30 tablet 1 07/17/2016 at noon  . nitroGLYCERIN (NITROSTAT) 0.4 MG SL tablet Place 1 tablet (0.4 mg total) under the tongue every 5 (five) minutes as needed for chest pain. 100 tablet 5 07/17/2016 at mid afternoon  . OMEPRAZOLE PO Take 1 capsule by mouth daily.   07/17/2016 at noon  . OVER THE COUNTER MEDICATION Place 1 drop into both eyes 2 (two) times daily as needed (dry eyes).   07/17/2016 at Unknown time  . ranolazine (RANEXA) 1000 MG SR tablet Take 1,000 mg by mouth 2 (two) times daily.    07/17/2016 at noon  . warfarin (COUMADIN) 4 MG tablet Take 0.5-1 tablets (2-4 mg total) by mouth daily. Take 4mg  for the next 3 days (until 05/24/16), then resume regular  dosing.   Take 2 mg on Tues / Thurs ** Take 4 mg on Sun / Mon / Wed / Fri / Sat (Patient taking differently: Take 4 mg by mouth daily after breakfast. )   07/17/2016 at betw 11a and 12p  . carvedilol (COREG) 25 MG tablet Take 0.5 tablets (12.5 mg total) by mouth 2 (two) times daily with a meal. 90 tablet 3 ??  . lisinopril (PRINIVIL,ZESTRIL) 5 MG tablet Take 1 tablet (5 mg total) by mouth daily. 30 tablet 6 ??   Scheduled:  Infusions:   Assessment: 83yo male with extensive cardiac history and Afib on warfarin PTA presents with chest tightness. Pharmacy is consulted to dose warfarin for atrial fibrillation.  -INR= 3.71  PTA Warfarin Dose: 4mg /d with last dose 3/10  Goal of Therapy:  INR 2-3 Monitor platelets by anticoagulation protocol: Yes   Plan:  -hold coumadin -daily PT/INR  [name], Pharm D 07/17/2016 9:20 PM

## 2016-07-17 NOTE — ED Triage Notes (Signed)
Pt initially called [facility] for chest pain that resolved with nitro.  He then stated dizziness and problems with gait.  [facility] then did orthostatics and pt was neg for orthostatics and negative for stroke screen.

## 2016-07-17 NOTE — Progress Notes (Signed)
ANTICOAGULATION CONSULT NOTE - Initial Consult  Pharmacy Consult for Warfarin Indication: atrial fibrillation  Allergies  Allergen Reactions  . Metformin And Related Nausea And Vomiting    Patient Measurements:    Vital Signs: BP: 119/71 (03/10 1853) Pulse Rate: 61 (03/10 1853)  Labs:  Recent Labs  07/17/16 1716  HGB 11.9*  HCT 35.6*  PLT 152  CREATININE 1.14    CrCl cannot be calculated (Unknown ideal weight.).   Medical History: Past Medical History:  Diagnosis Date  . Aortic stenosis    a. Mild-mod by echo 2013.  . Barrett esophagus   . Chronic anticoagulation   . Chronic systolic CHF (congestive heart failure) (HCC)    a. EF previously 35-40%. b. improved to 55-65% by cath 08/2013.  [name] Colon cancer ([place])    a. 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 b.  "graduated" from their practice 06/2013, remains free of any new disease per those notes.  . Coronary artery disease    a. s/p PCI w/ DES to mLAD 08/05/10. b. NSTEMI 08/2013 (mildly elev trop) - cath showing widely patent stent, 80% prox D1 (small vessel) with recommendation for medical management, residual 60% ostial PDA, <20% LCx, LVEF 55-65%.   . Depression   . Gallstone pancreatitis 2011   a. s/p chole.  [name] GERD (gastroesophageal reflux disease)   . Hyperlipidemia   . Hypertension   . Macrocytic anemia 06/25/2011  . Myelodysplastic disease ([place])    a. Suspected low-grade  myelodysplastic disease per onc notes.  . Other pancytopenia ([place]) 06/18/2014   Mild, fluctuating, likely med related. Rule out early MDS from prior chemo/RT or colon cancer  . PAF (paroxysmal atrial fibrillation) ([place])    a. failed TEE due to inability to pass probe into the esophagus in March 2013. b. On Coumadin. Was in NSR 08/2013 admission.  . Premature atrial contractions   . Premature ventricular contractions   . SBO (small bowel obstruction) 2005  . Type II diabetes mellitus (HCC)     Medications:   (Not in a hospital  admission) Scheduled:  Infusions:   Assessment: 83yo male with extensive cardiac history and Afib on warfarin PTA presents with chest tightness. Pharmacy is consulted to dose warfarin for atrial fibrillation.   PTA Warfarin Dose: 4mg /d with last dose 3/10  Goal of Therapy:  INR 2-3 Monitor platelets by anticoagulation protocol: Yes   Plan:  STAT INR and will determine if additional warfarin is needed tonight Daily INR Monitor s/sx of bleeding  Andrey Cota. Diona Foley, PharmD, BCPS Clinical Pharmacist [phone] 07/17/2016,7:23 PM

## 2016-07-18 DIAGNOSIS — I200 Unstable angina: Secondary | ICD-10-CM | POA: Diagnosis not present

## 2016-07-18 DIAGNOSIS — Z7901 Long term (current) use of anticoagulants: Secondary | ICD-10-CM | POA: Diagnosis not present

## 2016-07-18 DIAGNOSIS — I25118 Atherosclerotic heart disease of native coronary artery with other forms of angina pectoris: Secondary | ICD-10-CM | POA: Diagnosis not present

## 2016-07-18 DIAGNOSIS — K2270 Barrett's esophagus without dysplasia: Secondary | ICD-10-CM

## 2016-07-18 LAB — URINALYSIS, ROUTINE W REFLEX MICROSCOPIC
BILIRUBIN URINE: NEGATIVE
Bacteria, UA: NONE SEEN
GLUCOSE, UA: NEGATIVE mg/dL
HGB URINE DIPSTICK: NEGATIVE
Ketones, ur: NEGATIVE mg/dL
LEUKOCYTES UA: NEGATIVE
NITRITE: NEGATIVE
PROTEIN: 30 mg/dL — AB
Specific Gravity, Urine: 1.018 (ref 1.005–1.030)
Squamous Epithelial / LPF: NONE SEEN
pH: 5 (ref 5.0–8.0)

## 2016-07-18 LAB — HEPATIC FUNCTION PANEL
ALBUMIN: 2.7 g/dL — AB (ref 3.5–5.0)
ALT: 20 U/L (ref 17–63)
AST: 24 U/L (ref 15–41)
Alkaline Phosphatase: 40 U/L (ref 38–126)
BILIRUBIN TOTAL: 0.8 mg/dL (ref 0.3–1.2)
Bilirubin, Direct: 0.2 mg/dL (ref 0.1–0.5)
Indirect Bilirubin: 0.6 mg/dL (ref 0.3–0.9)
TOTAL PROTEIN: 6.8 g/dL (ref 6.5–8.1)

## 2016-07-18 LAB — TROPONIN I
TROPONIN I: 0.03 ng/mL — AB (ref <0.03)
TROPONIN I: 0.03 ng/mL — AB (ref <0.03)

## 2016-07-18 LAB — GLUCOSE, CAPILLARY
GLUCOSE-CAPILLARY: 146 mg/dL — AB (ref 65–99)
GLUCOSE-CAPILLARY: 177 mg/dL — AB (ref 65–99)
GLUCOSE-CAPILLARY: 134 mg/dL — AB (ref 65–99)
GLUCOSE-CAPILLARY: 138 mg/dL — AB (ref 65–99)

## 2016-07-18 LAB — PROTIME-INR
INR: 4.58
Prothrombin Time: 44.6 seconds — ABNORMAL HIGH (ref 11.4–15.2)

## 2016-07-18 LAB — LIPASE, BLOOD: LIPASE: 29 U/L (ref 11–51)

## 2016-07-18 MED ORDER — PHYTONADIONE 1 MG/0.5 ML ORAL SOLUTION
1.0000 mg | Freq: Once | ORAL | Status: AC
  Administered 2016-07-18: 1 mg via ORAL
  Filled 2016-07-18: qty 0.5

## 2016-07-18 NOTE — Progress Notes (Signed)
[facility] PROGRESS NOTE  KAGEN KUNATH LGX:[id number] DOB: 03/15/1924 DOA: 07/17/2016   PCP: Irven Shelling, MD     Assessment/Plan: Principal Problem:   Chest pain Active Problems:   Atherosclerosis of native coronary artery of native heart with stable angina pectoris (HCC)   Paroxysmal atrial fibrillation (HCC)   Colon cancer metastasized to mesenteric lymph nodes (HCC)   Chronic systolic CHF (congestive heart failure) (HCC)   Non-insulin dependent type 2 diabetes mellitus ([place])   Chronic anticoagulation   Barrett esophagus   83 year old male with history of CAD status post PCI with DES to the LAD in 2012, NSTEMI in 2015, and recent cath a couple weeks ago showing widely patent LAD stent but 80% stenosis to the second diag and 50% stenosis to the RCA, who presents with chest tightness. Patient reports having intermittent chest tightness  requiring four sublingual nitroglycerin today, which is very unusual for him.   He also has a history of paroxysmal A. fib and is on Coumadin. No other aggravating or relieving factors or associated symptoms. He is currently pain-free.  Assessment and plan Chest pain, atypical CAD s/p DES to mLAD in 2012 Cardiac enzymes unchanged, telemetry shows few PVCs Patient is status postcardiac cath on 05/20/16-showing widely patent LAD stent with 80% second diag lesion. He was continued on medical therapy and his coumadin resumed. Noted hesitancy to place on Plavix given GI bleed in the past and hx of myelodysplastic syndrome. Does have a remote hx of brief GI bleed in 5/16 that resolved, seen by GI with no invasive work up. -Continue carvedilol, atorvastatin, lisinopril, Imdurand Ranexa. Patient very anxious and not sure why he keeps having the chest pain Will request cardiology to talk to him   PAF/PVC's  - Continue coumadin. Currently normal sinus rhythm with PVCs Repeat ECHO to r/o cardiomyopathy INR supratherapeutic , will  repeat in am and hold coumadin, given one dose of 1 mf vit k   HTN - Stable on current medications.     HLD -05/20/2016: Cholesterol 122; HDL 48; LDL Cholesterol 60; Triglycerides 70; VLDL 14  - Continue statin.   Diabetes mellitus Continue home medications. Most recent hemoglobin A1c 6.7 on 05/20/16.  Barrett's esophagus -continue PPI  History of Colon Cancer mets to lymph nodes - "graduated" from oncology surveillance in 2015   DVT prophylaxsis Coumadin  Code Status:  Full code   Family Communication: Discussed in detail with the patient, all imaging results, lab results explained to the patient   Disposition Plan:  In am       Consultants:  None  Procedures:  None  Antibiotics: Anti-infectives    None         HPI/Subjective: Chest pain free, very concerned about the episode yesterday  Objective: Vitals:   07/17/16 1930 07/17/16 2015 07/17/16 2055 07/18/16 0548  BP: 149/71 154/76 (!) 155/80   Pulse: 67 (!) 57 73 79  Resp: 14 16    Temp:   98.1 F (36.7 C) 98.4 F (36.9 C)  TempSrc:   Oral Oral  SpO2: 97% 96% 95% 93%  Weight:   56.9 kg (125 lb 6.4 oz) 68 kg (149 lb 14.4 oz)  Height:   5\' 9"  (1.753 m)     Intake/Output Summary (Last 24 hours) at 07/18/16 0906 Last data filed at 07/18/16 0559  Gross per 24 hour  Intake              480 ml  Output              400 ml  Net               80 ml    Exam:  Examination:  General exam: Appears calm and comfortable  Respiratory system: Clear to auscultation. Respiratory effort normal. Cardiovascular system: S1 & S2 heard, RRR. No JVD, murmurs, rubs, gallops or clicks. No pedal edema. Gastrointestinal system: Abdomen is nondistended, soft and nontender. No organomegaly or masses felt. Normal bowel sounds heard. Central nervous system: Alert and oriented. No focal neurological deficits. Extremities: Symmetric 5 x 5 power. Skin: No rashes, lesions or ulcers Psychiatry: Judgement and insight appear  normal. Mood & affect appropriate.     Data Reviewed: I have personally reviewed following labs and imaging studies  Micro Results No results found for this or any previous visit (from the past 240 hour(s)).  Radiology Reports Dg Chest 2 View  Result Date: 07/17/2016 CLINICAL DATA:  Chest pain EXAM: CHEST  2 VIEW COMPARISON:  Chest radiograph 05/19/2016 FINDINGS: Mild cardiomegaly and aortic atherosclerosis are unchanged. Bilateral multifocal pulmonary opacities are again seen, but the degree of opacity in the right upper lobe has worsened. There is no pneumothorax or sizable pleural effusion. No acute osseous abnormality. IMPRESSION: Increased opacities in the right upper lobe superimposed on a background of chronic parenchymal lung disease may indicate developing consolidation, including the possibility of pneumonia. Unchanged cardiomegaly and calcific aortic atherosclerosis. Electronically Signed   By: Ulyses Jarred M.D.   On: 07/17/2016 17:49     CBC  Recent Labs Lab 07/17/16 1716  WBC 5.6  HGB 11.9*  HCT 35.6*  PLT 152  MCV 106.0*  MCH 35.4*  MCHC 33.4  RDW 14.0    Chemistries   Recent Labs Lab 07/17/16 1716  NA 138  K 4.0  CL 106  CO2 23  GLUCOSE 127*  BUN 24*  CREATININE 1.14  CALCIUM 8.8*   ------------------------------------------------------------------------------------------------------------------ estimated creatinine clearance is 40.6 mL/min (by C-G formula based on SCr of 1.14 mg/dL). ------------------------------------------------------------------------------------------------------------------ No results for input(s): HGBA1C in the last 72 hours. ------------------------------------------------------------------------------------------------------------------ No results for input(s): CHOL, HDL, LDLCALC, TRIG, CHOLHDL, LDLDIRECT in the last 72  hours. ------------------------------------------------------------------------------------------------------------------ No results for input(s): TSH, T4TOTAL, T3FREE, THYROIDAB in the last 72 hours.  Invalid input(s): FREET3 ------------------------------------------------------------------------------------------------------------------ No results for input(s): VITAMINB12, FOLATE, FERRITIN, TIBC, IRON, RETICCTPCT in the last 72 hours.  Coagulation profile  Recent Labs Lab 07/17/16 2021  INR 3.71    No results for input(s): DDIMER in the last 72 hours.  Cardiac Enzymes  Recent Labs Lab 07/17/16 2021 07/18/16 0301  TROPONINI 0.03* 0.03*   ------------------------------------------------------------------------------------------------------------------ Invalid input(s): POCBNP   CBG:  Recent Labs Lab 07/17/16 1720 07/17/16 2226 07/18/16 0734  GLUCAP 117* 180* 146*       Studies: Dg Chest 2 View  Result Date: 07/17/2016 CLINICAL DATA:  Chest pain EXAM: CHEST  2 VIEW COMPARISON:  Chest radiograph 05/19/2016 FINDINGS: Mild cardiomegaly and aortic atherosclerosis are unchanged. Bilateral multifocal pulmonary opacities are again seen, but the degree of opacity in the right upper lobe has worsened. There is no pneumothorax or sizable pleural effusion. No acute osseous abnormality. IMPRESSION: Increased opacities in the right upper lobe superimposed on a background of chronic parenchymal lung disease may indicate developing consolidation, including the possibility of pneumonia. Unchanged cardiomegaly and calcific aortic atherosclerosis. Electronically Signed   By: Ulyses Jarred M.D.   On: 07/17/2016 17:49  Lab Results  Component Value Date   HGBA1C 6.7 (H) 05/20/2016   HGBA1C 6.5 (H) 11/03/2015   HGBA1C 6.4 (H) 08/10/2015   Lab Results  Component Value Date   LDLCALC 60 05/20/2016   CREATININE 1.14 07/17/2016       Scheduled Meds: . atorvastatin  40 mg  Oral q1800  . B-complex with vitamin C  1 tablet Oral Daily  . carvedilol  12.5 mg Oral BID WC  . donepezil  10 mg Oral QHS  . finasteride  5 mg Oral QHS  . glimepiride  1 mg Oral Q breakfast  . isosorbide mononitrate  120 mg Oral Daily  . linagliptin  5 mg Oral Daily  . pantoprazole  80 mg Oral Daily  . ranolazine  1,000 mg Oral BID  . Warfarin - Pharmacist Dosing Inpatient   Does not apply q1800   Continuous Infusions:   LOS: 0 days    Time spent: >30 MINS    [facility]  [facility] Pager [phone]. If 7PM-7AM, please contact night-coverage at [email or web address], password [email or web address] 07/18/2016, 9:06 AM  LOS: 0 days

## 2016-07-18 NOTE — Progress Notes (Signed)
[place] for Warfarin Indication: atrial fibrillation  Allergies  Allergen Reactions  . Metformin And Related Nausea And Vomiting    Patient Measurements: Height: 5\' 9"  (175.3 cm) Weight: 149 lb 14.4 oz (68 kg) IBW/kg (Calculated) : 70.7  Vital Signs: Temp: 98.4 F (36.9 C) (03/11 0548) Temp Source: Oral (03/11 0548) Pulse Rate: 79 (03/11 0548)  Labs:  Recent Labs  07/17/16 1716 07/17/16 2021 07/18/16 0301 07/18/16 0807  HGB 11.9*  --   --   --   HCT 35.6*  --   --   --   PLT 152  --   --   --   LABPROT  --  37.7*  --  44.6*  INR  --  3.71  --  4.58*  CREATININE 1.14  --   --   --   TROPONINI  --  0.03* 0.03* 0.03*    Estimated Creatinine Clearance: 40.6 mL/min (by C-G formula based on SCr of 1.14 mg/dL).    Assessment: 83yo male with extensive cardiac history and Afib on warfarin PTA presents with chest tightness. Pharmacy is consulted to dose warfarin for atrial fibrillation. INR trended up to 4.58 likely reflective of dose taken yesterday. CBC was low but within normal limits last night. No overt signs of bleeding noted.   PTA Warfarin Dose: 4mg /d with last dose 3/10  Goal of Therapy:  INR 2-3 Monitor platelets by anticoagulation protocol: Yes   Plan:  - Hold warfarin tonight - Daily INR - Monitor for signs/symptoms of bleeding   Uvaldo Bristle, PharmD PGY1 Pharmacy Resident Pager: [phone]  07/18/2016 10:46 AM

## 2016-07-18 NOTE — Discharge Instructions (Signed)

## 2016-07-18 NOTE — Plan of Care (Signed)
Problem: Safety: Goal: Ability to remain free from injury will improve Outcome: Progressing Reviewed safety issues, call light, white board, phone numbers to reach RN/NT and patient verbalized understanding. Explained also to patient the bed alarm system and why it is being used and patient verbalized understanding and was okay with it being on, will continue to monitor.

## 2016-07-18 NOTE — Progress Notes (Signed)
CRITICAL VALUE ALERT  Critical value received:  INR: 4.58  Date of notification:  07/18/2016  Time of notification:  8110  Critical value read back:Yes.    Nurse who received alert:  Clint Lipps RN  MD notified (1st page):  Dr. Allyson Sabal  Time of first page:  1017  MD notified (2nd page):  Time of second page:  Responding MD:  Dr. Allyson Sabal  Time MD responded:  No response

## 2016-07-19 DIAGNOSIS — I25118 Atherosclerotic heart disease of native coronary artery with other forms of angina pectoris: Secondary | ICD-10-CM | POA: Diagnosis not present

## 2016-07-19 DIAGNOSIS — I480 Paroxysmal atrial fibrillation: Secondary | ICD-10-CM | POA: Diagnosis not present

## 2016-07-19 DIAGNOSIS — R079 Chest pain, unspecified: Secondary | ICD-10-CM

## 2016-07-19 DIAGNOSIS — E119 Type 2 diabetes mellitus without complications: Secondary | ICD-10-CM | POA: Diagnosis not present

## 2016-07-19 DIAGNOSIS — I200 Unstable angina: Secondary | ICD-10-CM | POA: Diagnosis not present

## 2016-07-19 DIAGNOSIS — Z7901 Long term (current) use of anticoagulants: Secondary | ICD-10-CM | POA: Diagnosis not present

## 2016-07-19 LAB — COMPREHENSIVE METABOLIC PANEL
ALT: 18 U/L (ref 17–63)
AST: 22 U/L (ref 15–41)
Albumin: 2.9 g/dL — ABNORMAL LOW (ref 3.5–5.0)
Alkaline Phosphatase: 42 U/L (ref 38–126)
Anion gap: 7 (ref 5–15)
BILIRUBIN TOTAL: 0.9 mg/dL (ref 0.3–1.2)
BUN: 15 mg/dL (ref 6–20)
CALCIUM: 8.6 mg/dL — AB (ref 8.9–10.3)
CO2: 24 mmol/L (ref 22–32)
CREATININE: 1 mg/dL (ref 0.61–1.24)
Chloride: 106 mmol/L (ref 101–111)
Glucose, Bld: 113 mg/dL — ABNORMAL HIGH (ref 65–99)
Potassium: 3.5 mmol/L (ref 3.5–5.1)
Sodium: 137 mmol/L (ref 135–145)
TOTAL PROTEIN: 6.5 g/dL (ref 6.5–8.1)

## 2016-07-19 LAB — GLUCOSE, CAPILLARY
GLUCOSE-CAPILLARY: 115 mg/dL — AB (ref 65–99)
GLUCOSE-CAPILLARY: 130 mg/dL — AB (ref 65–99)

## 2016-07-19 LAB — CBC
HEMATOCRIT: 35.1 % — AB (ref 39.0–52.0)
Hemoglobin: 11.9 g/dL — ABNORMAL LOW (ref 13.0–17.0)
MCH: 35 pg — AB (ref 26.0–34.0)
MCHC: 33.9 g/dL (ref 30.0–36.0)
MCV: 103.2 fL — AB (ref 78.0–100.0)
Platelets: 169 10*3/uL (ref 150–400)
RBC: 3.4 MIL/uL — AB (ref 4.22–5.81)
RDW: 13.3 % (ref 11.5–15.5)
WBC: 5.9 10*3/uL (ref 4.0–10.5)

## 2016-07-19 LAB — PROTIME-INR
INR: 2.79
Prothrombin Time: 30 seconds — ABNORMAL HIGH (ref 11.4–15.2)

## 2016-07-19 MED ORDER — ISOSORBIDE MONONITRATE ER 120 MG PO TB24
120.0000 mg | ORAL_TABLET | Freq: Every day | ORAL | 1 refills | Status: DC
Start: 2016-07-20 — End: 2017-01-04

## 2016-07-19 MED ORDER — PANTOPRAZOLE SODIUM 40 MG PO TBEC: 40.0000 mg | DELAYED_RELEASE_TABLET | Freq: Every day | ORAL | 1 refills | Status: DC

## 2016-07-19 NOTE — Progress Notes (Signed)
Patient discharge home, self care. Home Health services arranged per CM. Emphasis placed on picking up prescriptions at [facility]. Patient acknowledged change made in medication regimen; verbalized and demonstrated understanding. Called [place] [facility] to transport home, and patient to pay for service. Transferred out of unit via wheelchair by volunteer, no family member present at D/C.

## 2016-07-19 NOTE — Consult Note (Signed)
CARDIOLOGY CONSULT NOTE   Patient ID: [name] MRN: [id number] DOB/AGE: 1923-07-02 83 y.o.  Admit date: 07/17/2016  Requesting Physician: Primary Physician:   Irven Shelling, MD Primary Cardiologist:   Dr. [name] Reason for Consultation:   Chest pain  HPI: [name] is a 83 y.o. male with a history of CAD status post PCI with DES to the LAD in 2012, NSTEMI in 2015, and recent cath a couple weeks ago showing widely patent LAD stent but 80% stenosis to the second diag and 50% stenosis to the RCA, CAD, hx of metastatic cancer, PAF on chronic coumadin, prior chronic CHF with improved EF who presents with chest tightness. Per chart review the patient went to the ER for having intermittent chest tightness  requiring foursublingual nitroglycerin today, which is very unusual for him.   He also has a history of paroxysmal A. fib and is on Coumadin. He has dementia and and initially does not remember why he went to the ER, why he is in the hospital. This is his baseline per notes and RN.  On repeat interview, he admits he has been taking more Nitro than normal, that he is not very active and that the pain comes on suddenly. He describes it as a severe lest sided chest pressure.[name]  He currently is pain free Troponins are negative. His chest xray, shows chronic lung disease vs superimposed pneumonia, strongly favoring chronic lung disease as he does not have an elevated WBC, is not febrile, or weak. No N/V/D, eating well.      Past Medical History:  Diagnosis Date  . Aortic stenosis    a. Mild-mod by echo 2013.  . Barrett esophagus   . Chronic anticoagulation   . Chronic systolic CHF (congestive heart failure) (HCC)    a. EF previously 35-40%. b. improved to 55-65% by cath 08/2013.  [name] Colon cancer ([place])    a. 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 b.  "graduated" from their practice 06/2013, remains free of any new disease per those notes.  . Coronary  artery disease    a. s/p PCI w/ DES to mLAD 08/05/10. b. NSTEMI 08/2013 (mildly elev trop) - cath showing widely patent stent, 80% prox D1 (small vessel) with recommendation for medical management, residual 60% ostial PDA, <20% LCx, LVEF 55-65%.   . Depression   . Gallstone pancreatitis 2011   a. s/p chole.  [name] GERD (gastroesophageal reflux disease)   . Hyperlipidemia   . Hypertension   . Macrocytic anemia 06/25/2011  . Myelodysplastic disease ([place])    a. Suspected low-grade  myelodysplastic disease per onc notes.  . Other pancytopenia ([place]) 06/18/2014   Mild, fluctuating, likely med related. Rule out early MDS from prior chemo/RT or colon cancer  . PAF (paroxysmal atrial fibrillation) ([place])    a. failed TEE due to inability to pass probe into the esophagus in March 2013. b. On Coumadin. Was in NSR 08/2013 admission.  . Premature atrial contractions   . Premature ventricular contractions   . SBO (small bowel obstruction) 2005  . Type II diabetes mellitus ([place])      Past Surgical History:  Procedure Laterality Date  . ABDOMINAL MASS RESECTION     Mass near mesentery  . APPENDECTOMY     "took out during colon surgery"  . CARDIAC CATHETERIZATION  08/03/10  . CARDIAC CATHETERIZATION  08/09/10   LAD 30, stent OK, CFX 40, RCA < 20  . CARDIAC  CATHETERIZATION N/A 05/20/2016   Procedure: Left Heart Cath and Coronary Angiography;  Surgeon: Lorretta Harp, MD;  Location: [place] [facility];  Service: Cardiovascular;  Laterality: N/A;  . CATARACT EXTRACTION Bilateral   . CHOLECYSTECTOMY  2011  . COLON SURGERY    . CORONARY ANGIOPLASTY WITH STENT PLACEMENT  08/05/10   DES to the LAD  . CYSTOSCOPY     "with removal of kidney stone in office"  . CYSTOSCOPY WITH RETROGRADE PYELOGRAM, URETEROSCOPY AND STENT PLACEMENT Bilateral 05/18/2013   Procedure: CYSTOSCOPY WITH BILATERAL RETROGRADE PYELOGRAM, LEFT URETEROSCOPY AND LEFT STENT PLACEMENT;  Surgeon: Alexis Frock, MD;  Location: [facility];  Service:  Urology;  Laterality: Bilateral;  . LEFT HEART CATHETERIZATION WITH CORONARY ANGIOGRAM N/A 08/15/2013   Procedure: LEFT HEART CATHETERIZATION WITH CORONARY ANGIOGRAM;  Surgeon: Peter M Martinique, MD;  Location: [facility];  Service: Cardiovascular;  Laterality: N/A;  . RIGHT COLECTOMY    . TONSILLECTOMY      Allergies  Allergen Reactions  . Metformin And Related Nausea And Vomiting    I have reviewed the patient's current medications . atorvastatin  40 mg Oral q1800  . B-complex with vitamin C  1 tablet Oral Daily  . carvedilol  12.5 mg Oral BID WC  . donepezil  10 mg Oral QHS  . finasteride  5 mg Oral QHS  . glimepiride  1 mg Oral Q breakfast  . isosorbide mononitrate  120 mg Oral Daily  . linagliptin  5 mg Oral Daily  . pantoprazole  80 mg Oral Daily  . ranolazine  1,000 mg Oral BID  . Warfarin - Pharmacist Dosing Inpatient   Does not apply q1800    acetaminophen, nitroGLYCERIN, ondansetron (ZOFRAN) IV  Prior to Admission medications   Medication Sig Start Date End Date Taking? Authorizing Provider  atorvastatin (LIPITOR) 40 MG tablet TAKE ONE TABLET BY MOUTH ONCE DAILY AT  6  PM Patient taking differently: TAKE ONE TABLET BY MOUTH ONCE DAILY 01/16/16  Yes Thayer Headings, MD  B Complex-C (B-COMPLEX WITH VITAMIN C) tablet Take 1 tablet by mouth daily.   Yes Historical Provider, MD  carvedilol (COREG) 25 MG tablet Take 0.5 tablets (12.5 mg total) by mouth 2 (two) times daily with a meal. 07/15/16 07/15/17 Yes Thayer Headings, MD  Cholecalciferol (VITAMIN D-3 PO) Take 1 tablet by mouth daily.    Yes Historical Provider, MD  donepezil (ARICEPT) 10 MG tablet Take 10 mg by mouth at bedtime.  05/04/16  Yes Historical Provider, MD  finasteride (PROSCAR) 5 MG tablet Take 5 mg by mouth at bedtime.  04/01/14  Yes Historical Provider, MD  glimepiride (AMARYL) 1 MG tablet Take 1 mg by mouth daily.  05/29/16  Yes Historical Provider, MD  isosorbide mononitrate (IMDUR) 60 MG 24 hr tablet Take 2  tablets (120 mg total) by mouth daily. Patient taking differently: Take 90 mg by mouth daily.  05/21/16  Yes Cheryln Manly, NP  linagliptin (TRADJENTA) 5 MG TABS tablet Take 1 tablet (5 mg total) by mouth daily. For blood sugar 11/04/15  Yes Clanford Marisa Hua, MD  nitroGLYCERIN (NITROSTAT) 0.4 MG SL tablet Place 1 tablet (0.4 mg total) under the tongue every 5 (five) minutes as needed for chest pain. 07/30/15  Yes Thayer Headings, MD  OMEPRAZOLE PO Take 1 capsule by mouth daily.   Yes Historical Provider, MD  OVER THE COUNTER MEDICATION Place 1 drop into both eyes 2 (two) times daily as needed (dry eyes).  Yes Historical Provider, MD  ranolazine (RANEXA) 1000 MG SR tablet Take 1,000 mg by mouth 2 (two) times daily.    Yes Historical Provider, MD  warfarin (COUMADIN) 4 MG tablet Take 0.5-1 tablets (2-4 mg total) by mouth daily. Take 4mg  for the next 3 days (until 05/24/16), then resume regular dosing.   Take 2 mg on Tues / Thurs ** Take 4 mg on Sun / Mon / Wed / Fri / Sat Patient taking differently: Take 4 mg by mouth daily after breakfast.  05/21/16  Yes Cheryln Manly, NP  lisinopril (PRINIVIL,ZESTRIL) 5 MG tablet Take 1 tablet (5 mg total) by mouth daily. 05/22/16   Cheryln Manly, NP     Social History   Social History  . Marital status: Single    Spouse name: N/A  . Number of children: 0  . Years of education: N/A   Occupational History  . Not on file.   Social History Main Topics  . Smoking status: Former Smoker    Packs/day: 1.00    Years: 30.00    Types: Cigarettes    Quit date: 05/10/1994  . Smokeless tobacco: Never Used  . Alcohol use 1.2 oz/week    2 Shots of liquor per week     Comment: occasionally.  . Drug use: No  . Sexual activity: No   Other Topics Concern  . Not on file   Social History Narrative   Lives alone.      Family Status  Relation Status  . Mother Deceased  . Father Deceased  . Paternal Grandfather Deceased  . Paternal Grandmother  Deceased  . Maternal Grandfather Deceased  . Maternal Grandmother Deceased  . Brother    Family History  Problem Relation Age of Onset  . Cancer Mother   . Ulcers Father 91  . Heart attack Brother 70          ROS:  Full 14 point review of systems complete and found to be negative unless listed above.  Physical Exam: Blood pressure (!) 172/92, pulse 90, temperature 98.3 F (36.8 C), temperature source Oral, resp. rate 18, height 5\' 9"  (1.753 m), weight 147 lb 3.2 oz (66.8 kg), SpO2 94 %.  General: Well developed, well nourished, male in no acute distress Head: Eyes PERRLA, No xanthomas.   Normocephalic and atraumatic, oropharynx without edema or exudate. Dentition:  Lungs: normal effort and rate of breathing. + intermittent cough Heart:: normal rate and rhythm.  Neck: No carotid bruits. No lymphadenopathy. Abdomen: Bowel sounds present, abdomen soft and non-tender without masses or hernias noted. Msk:  No spine or cva tenderness. No weakness, no joint deformities or effusions. Extremities: No clubbing or cyanosis.  edema.  Neuro: Alert and oriented X 3. No focal deficits noted. (patient has intermittent confusion) Psych:  Good affect, responds appropriately Skin: No rashes or lesions noted.     Labs:   Lab Results  Component Value Date   WBC 5.9 07/19/2016   HGB 11.9 (L) 07/19/2016   HCT 35.1 (L) 07/19/2016   MCV 103.2 (H) 07/19/2016   PLT 169 07/19/2016    Recent Labs  07/19/16 0251  INR 2.79     Recent Labs Lab 07/19/16 0251  NA 137  K 3.5  CL 106  CO2 24  BUN 15  CREATININE 1.00  CALCIUM 8.6*  PROT 6.5  BILITOT 0.9  ALKPHOS 42  ALT 18  AST 22  GLUCOSE 113*  ALBUMIN 2.9*    Recent Labs  07/17/16 2021 07/18/16 0301 07/18/16 0807  TROPONINI 0.03* 0.03* 0.03*    Recent Labs  07/17/16 1728 07/17/16 2027  TROPIPOC 0.01 0.01   Lab Results  Component Value Date   CHOL 122 05/20/2016   HDL 48 05/20/2016   LDLCALC 60 05/20/2016   TRIG  70 05/20/2016   Lab Results  Component Value Date   DDIMER 0.29 05/19/2016   Lipase  Date/Time Value Ref Range Status  07/18/2016 09:47 AM 29 11 - 51 U/L Final      Echo   Cardiac catheterization 05/2016  IMPRESSION: [name] has a widely patent LAD stent with no significant CAD and unchanged anatomy compared to his last cath 3 years ago. He has chronic stable angina possibly related to a small diagonal branch stenosis. His groin was successfully sealed with a minx device and he left the lab in stable condition. He can be discharged home tomorrow and begin Coumadin anticoagulation as an outpatient. He is currently in sinus rhythm but has a history of PAF. His attending cardiologist Dr. Liam Rogers is aware of these results. --- Diagnostic Diagram            ECG:   2D Echo 11/2015  Study Conclusions - EF 40%  - Left ventricle: The cavity size was normal. Systolic function was   normal. Wall motion was normal; there were no regional wall   motion abnormalities. Doppler parameters are consistent with   abnormal left ventricular relaxation (grade 1 diastolic   dysfunction). - Aortic valve: There was mild stenosis. - Right atrium: ? RA band cor triatriatum not clinically   significant - Atrial septum: No defect or patent foramen ovale was identified.      Radiology:   Dg Chest 2 View Result Date: 07/17/2016   IMPRESSION: Increased opacities in the right upper lobe superimposed on a background of chronic parenchymal lung disease may indicate developing consolidation, including the possibility of pneumonia. Unchanged cardiomegaly and calcific aortic atherosclerosis. Electronically Signed   By: Ulyses Jarred M.D.   On: 07/17/2016 17:49     ASSESSMENT AND PLAN:      Chest pain, atypical CAD s/p DES to mLAD in 2012 Cardiac enzymes unchanged 0.03 times 3 , telemetry shows few PVCs Patient is status postcardiac cath on 05/20/16-showing widely patent LAD stent with  80% second diag lesion. He was continued on medical therapy and his coumadin resumed. - recommend changing Imdur from 90 mg to 120 mg- This change was made at previous admission but the patient is not taking the new dosage at home. Close follow-up with Dr. [name] as outpatient.   PAF/PVC's  - Continue coumadin. Currently normal sinus rhythm with PVCs - No further recommendations from cardiology at this time.   HTN - Stable on current medications.   HLD -05/20/2016: Cholesterol 122; HDL 48; LDL Cholesterol 60; Triglycerides 70; VLDL 14 - Continue statin.   Diabetes mellitus Per internal Medicine  Barrett's esophagus -continue PPI, per internal medicine  History of Colon Cancer mets to lymph nodes - "graduated" from oncology surveillance in 2015, per internal medicine.   SignedLinus Mako, PA-C 07/19/2016 9:16 AM

## 2016-07-19 NOTE — Care Management Note (Addendum)
Case Management Note  Patient Details  Name: FINNEGAN GATTA MRN: [id number] Date of Birth: December 30, 1923  Subjective/Objective: Pt presented for chest pain. Plan will be for d/c home today. Pt has family support. CM did speak with pt in regards to [facility] and pt refused for medication assistance/ disease management.                    Action/Plan: Pt plans to pay for cab for transportation home. No further needs from CM at this time.   Expected Discharge Date:  07/19/16               Expected Discharge Plan:  Home/Self Care  In-House Referral:  NA  Discharge planning Services  CM Consult  Post Acute Care Choice:  Home Health Choice offered to:  Patient  DME Arranged:  N/A DME Agency:  NA  HH Arranged:  RN, SW HH Agency:  [facility]  Status of Service:  Completed, signed off  If discussed at [employer] of Stay Meetings, dates discussed:    Additional Comments: 1209 07-19-16 Jacqlyn Krauss, RN,BSN [phone]  Pt with new medications dosage- pt is agreeable to [facility] for medications and disease management. CM did make referral with [facility] for [facility]. Awaiting for confirmation. -Received call from [facility] that will be able to accept patient. No further needs from CM.  Bethena Roys, RN 07/19/2016, 11:53 AM

## 2016-07-19 NOTE — Progress Notes (Signed)
[facility] for Warfarin Indication: atrial fibrillation  Allergies  Allergen Reactions  . Metformin And Related Nausea And Vomiting    Patient Measurements: Height: 5\' 9"  (175.3 cm) Weight: 147 lb 3.2 oz (66.8 kg) IBW/kg (Calculated) : 70.7  Vital Signs: Temp: 98.3 F (36.8 C) (03/12 0610) Temp Source: Oral (03/12 0610) BP: 172/92 (03/12 0610) Pulse Rate: 90 (03/12 0610)  Labs:  Recent Labs  07/17/16 1716 07/17/16 2021 07/18/16 0301 07/18/16 0807 07/19/16 0251  HGB 11.9*  --   --   --  11.9*  HCT 35.6*  --   --   --  35.1*  PLT 152  --   --   --  169  LABPROT  --  37.7*  --  44.6* 30.0*  INR  --  3.71  --  4.58* 2.79  CREATININE 1.14  --   --   --  1.00  TROPONINI  --  0.03* 0.03* 0.03*  --     Estimated Creatinine Clearance: 45.5 mL/min (by C-G formula based on SCr of 1 mg/dL).  Assessment: 83yo male with extensive cardiac history and Afib on warfarin PTA presents with chest tightness. Pharmacy is consulted to dose warfarin for atrial fibrillation.   INR elevated on admission to 3.7 trended up to 4.5 yesterday, now down to 2.79 with low dose given last night. Hgb stable overnight. No overt signs of bleeding noted.   PTA Warfarin Dose: 4mg /d with last dose 3/10  Goal of Therapy:  INR 2-3 Monitor platelets by anticoagulation protocol: Yes   Plan:   Would restart back with home dose tonight (4mg ), going forward patient may need weekly dose adjustment cutting back a couple days to just 2mg .  Per chart notes patient's anticoagulation is being followed by Dr.Griffin.   Erin Hearing PharmD., BCPS Clinical Pharmacist Phone [phone] 07/19/2016 10:47 AM

## 2016-07-19 NOTE — Care Management Obs Status (Signed)
[facility] NOTIFICATION   Patient Details  Name: [name] MRN: [id number] Date of Birth: [birth year]   Medicare Observation Status Notification Given:  Yes    Bethena Roys, RN 07/19/2016, 11:52 AM

## 2016-07-19 NOTE — Discharge Summary (Signed)
Physician Discharge Summary  [name] MRN: [id number] DOB/AGE: November 02, 1923 83 y.o.  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 Qty: 30 tablet, Refills: 1      CONTINUE these medications which have CHANGED   Details  isosorbide mononitrate (IMDUR) 120 MG 24 hr tablet Take 1 tablet (120 mg total) by mouth daily. Qty: 30 tablet, Refills: 1      CONTINUE these medications which have NOT CHANGED   Details  atorvastatin (LIPITOR) 40 MG tablet TAKE ONE TABLET BY MOUTH ONCE DAILY AT  6  PM Qty: 90 tablet, Refills: 2    B Complex-C (B-COMPLEX WITH VITAMIN C) tablet Take 1 tablet by mouth daily.    carvedilol (COREG) 25 MG tablet Take 0.5 tablets (12.5 mg total) by mouth 2 (two) times daily with a meal. Qty: 90 tablet, Refills: 3    Cholecalciferol (VITAMIN D-3 PO) Take 1 tablet by mouth daily.     donepezil (ARICEPT) 10 MG tablet Take 10 mg by mouth at bedtime.     finasteride (PROSCAR) 5 MG tablet Take 5 mg by mouth at bedtime.     glimepiride (AMARYL) 1 MG tablet Take 1 mg by mouth daily.     linagliptin (TRADJENTA) 5 MG TABS tablet Take 1 tablet (5  mg total) by mouth daily. For blood sugar Qty: 30 tablet, Refills: 1    nitroGLYCERIN (NITROSTAT) 0.4 MG SL tablet Place 1 tablet (0.4 mg total) under the tongue every 5 (five) minutes as needed for chest pain. Qty: 100 tablet, Refills: 5    OMEPRAZOLE PO Take 1 capsule by mouth daily.    OVER THE COUNTER MEDICATION Place 1 drop into both eyes 2 (two) times daily as needed (dry eyes).    ranolazine (RANEXA) 1000 MG SR tablet Take 1,000 mg by mouth 2 (two) times daily.     warfarin (COUMADIN) 4 MG tablet Take 0.5-1 tablets (2-4 mg total) by mouth daily. Take 50m for the next 3 days (until 05/24/16), then resume regular dosing.   Take 2 mg on Tues / Thurs ** Take 4 mg on Sun / Mon / Wed / Fri / Sat    lisinopril (PRINIVIL,ZESTRIL) 5 MG tablet Take 1 tablet (5 mg total) by mouth daily. Qty: 30 tablet, Refills: 6          Discharge Condition:  Stable    Discharge Instructions Get Medicines reviewed and adjusted: Please take all your medications with you for your next visit with your Primary MD  Please request your Primary MD to go over all hospital tests and procedure/radiological results at the follow up, please ask your Primary MD to get all Hospital records  sent to his/her office.  If you experience worsening of your admission symptoms, develop shortness of breath, life threatening emergency, suicidal or homicidal thoughts you must seek medical attention immediately by calling 911 or calling your MD immediately if symptoms less severe.  You must read complete instructions/literature along with all the possible adverse reactions/side effects for all the Medicines you take and that have been prescribed to you. Take any new Medicines after you have completely understood and accpet all the possible adverse reactions/side effects.   Do not drive when taking Pain medications.   Do not take more than prescribed Pain, Sleep and Anxiety Medications  Special Instructions: If you have  smoked or chewed Tobacco in the last 2 yrs please stop smoking, stop any regular Alcohol and or any Recreational drug use.  Wear Seat belts while driving.  Please note  You were cared for by a hospitalist during your hospital stay. Once you are discharged, your primary care physician will handle any further medical issues. Please note that NO REFILLS for any discharge medications will be authorized once you are discharged, as it is imperative that you return to your primary care physician (or establish a relationship with a primary care physician if you do not have one) for your aftercare needs so that they can reassess your need for medications and monitor your lab values.     Allergies  Allergen Reactions  . Metformin And Related Nausea And Vomiting      Disposition: 01-Home or Self Care   Consults: Cardiology    Significant Diagnostic Studies:  Dg Chest 2 View  Result Date: 07/17/2016 CLINICAL DATA:  Chest pain EXAM: CHEST  2 VIEW COMPARISON:  Chest radiograph 05/19/2016 FINDINGS: Mild cardiomegaly and aortic atherosclerosis are unchanged. Bilateral multifocal pulmonary opacities are again seen, but the degree of opacity in the right upper lobe has worsened. There is no pneumothorax or sizable pleural effusion. No acute osseous abnormality. IMPRESSION: Increased opacities in the right upper lobe superimposed on a background of chronic parenchymal lung disease may indicate developing consolidation, including the possibility of pneumonia. Unchanged cardiomegaly and calcific aortic atherosclerosis. Electronically Signed   By: Ulyses Jarred M.D.   On: 07/17/2016 17:49        Filed Weights   07/17/16 2055 07/18/16 0548 07/19/16 0610  Weight: 56.9 kg (125 lb 6.4 oz) 68 kg (149 lb 14.4 oz) 66.8 kg (147 lb 3.2 oz)     Microbiology: No results found for this or any previous visit (from the past 240 hour(s)).     Blood Culture    Component Value Date/Time   SDES URINE,  CLEAN CATCH 06/07/2014 1924   SPECREQUEST NONE 06/07/2014 1924   CULT  06/07/2014 1924    INSIGNIFICANT GROWTH Performed at [place] 06/09/2014 FINAL 06/07/2014 1924      Labs: Results for orders placed or performed during the hospital encounter of 07/17/16 (from the past 48 hour(s))  Basic metabolic panel     Status: Abnormal   Collection Time: 07/17/16  5:16 PM  Result Value Ref Range   Sodium 138 135 - 145 mmol/L   Potassium 4.0 3.5 - 5.1 mmol/L   Chloride 106 101 - 111 mmol/L   CO2 23 22 - 32 mmol/L   Glucose, Bld 127 (H) 65 - 99 mg/dL   BUN 24 (H) 6 - 20 mg/dL   Creatinine, Ser 1.14 0.61 - 1.24 mg/dL   Calcium 8.8 (L) 8.9 - 10.3 mg/dL  GFR calc non Af Amer 54 (L) >60 mL/min   GFR calc Af Amer >60 >60 mL/min    Comment: (NOTE) The eGFR has been calculated using the CKD EPI equation. This calculation has not been validated in all clinical situations. eGFR's persistently <60 mL/min signify possible Chronic Kidney Disease.    Anion gap 9 5 - 15  CBC     Status: Abnormal   Collection Time: 07/17/16  5:16 PM  Result Value Ref Range   WBC 5.6 4.0 - 10.5 K/uL   RBC 3.36 (L) 4.22 - 5.81 MIL/uL   Hemoglobin 11.9 (L) 13.0 - 17.0 g/dL   HCT 35.6 (L) 39.0 - 52.0 %   MCV 106.0 (H) 78.0 - 100.0 fL   MCH 35.4 (H) 26.0 - 34.0 pg   MCHC 33.4 30.0 - 36.0 g/dL   RDW 14.0 11.5 - 15.5 %   Platelets 152 150 - 400 K/uL  CBG monitoring, ED     Status: Abnormal   Collection Time: 07/17/16  5:20 PM  Result Value Ref Range   Glucose-Capillary 117 (H) 65 - 99 mg/dL  I-Stat Troponin, ED (not at [facility])     Status: None   Collection Time: 07/17/16  5:28 PM  Result Value Ref Range   Troponin i, poc 0.01 0.00 - 0.08 ng/mL   Comment 3            Comment: Due to the release kinetics of cTnI, a negative result within the first hours of the onset of symptoms does not rule out myocardial infarction with certainty. If myocardial infarction is still suspected, repeat  the test at appropriate intervals.   Troponin I (q 6hr x 3)     Status: Abnormal   Collection Time: 07/17/16  8:21 PM  Result Value Ref Range   Troponin I 0.03 (HH) <0.03 ng/mL    Comment: CRITICAL RESULT CALLED TO, READ BACK BY AND VERIFIED WITH: D.ROGERS,RN 07/17/16 _0  BY V.WILKINS   Protime-INR     Status: Abnormal   Collection Time: 07/17/16  8:21 PM  Result Value Ref Range   Prothrombin Time 37.7 (H) 11.4 - 15.2 seconds   INR 3.71   I-Stat Troponin, ED (not at [facility])     Status: None   Collection Time: 07/17/16  8:27 PM  Result Value Ref Range   Troponin i, poc 0.01 0.00 - 0.08 ng/mL   Comment 3            Comment: Due to the release kinetics of cTnI, a negative result within the first hours of the onset of symptoms does not rule out myocardial infarction with certainty. If myocardial infarction is still suspected, repeat the test at appropriate intervals.   Glucose, capillary     Status: Abnormal   Collection Time: 07/17/16 10:26 PM  Result Value Ref Range   Glucose-Capillary 180 (H) 65 - 99 mg/dL  Urinalysis, Routine w reflex microscopic     Status: Abnormal   Collection Time: 07/18/16  1:05 AM  Result Value Ref Range   Color, Urine YELLOW YELLOW   APPearance CLEAR CLEAR   Specific Gravity, Urine 1.018 1.005 - 1.030   pH 5.0 5.0 - 8.0   Glucose, UA NEGATIVE NEGATIVE mg/dL   Hgb urine dipstick NEGATIVE NEGATIVE   Bilirubin Urine NEGATIVE NEGATIVE   Ketones, ur NEGATIVE NEGATIVE mg/dL   Protein, ur 30 (A) NEGATIVE mg/dL   Nitrite NEGATIVE NEGATIVE   Leukocytes, UA NEGATIVE NEGATIVE   RBC / HPF 0-5  0 - 5 RBC/hpf   WBC, UA 0-5 0 - 5 WBC/hpf   Bacteria, UA NONE SEEN NONE SEEN   Squamous Epithelial / LPF NONE SEEN NONE SEEN   Mucous PRESENT   Troponin I (q 6hr x 3)     Status: Abnormal   Collection Time: 07/18/16  3:01 AM  Result Value Ref Range   Troponin I 0.03 (HH) <0.03 ng/mL    Comment: CRITICAL VALUE NOTED.  VALUE IS CONSISTENT WITH PREVIOUSLY REPORTED AND  CALLED VALUE.  Glucose, capillary     Status: Abnormal   Collection Time: 07/18/16  7:34 AM  Result Value Ref Range   Glucose-Capillary 146 (H) 65 - 99 mg/dL  Troponin I (q 6hr x 3)     Status: Abnormal   Collection Time: 07/18/16  8:07 AM  Result Value Ref Range   Troponin I 0.03 (HH) <0.03 ng/mL    Comment: CRITICAL VALUE NOTED.  VALUE IS CONSISTENT WITH PREVIOUSLY REPORTED AND CALLED VALUE.  Protime-INR     Status: Abnormal   Collection Time: 07/18/16  8:07 AM  Result Value Ref Range   Prothrombin Time 44.6 (H) 11.4 - 15.2 seconds   INR 4.58 (HH)     Comment: REPEATED TO VERIFY CRITICAL RESULT CALLED TO, READ BACK BY AND VERIFIED WITH: K.WEST,RN 07/18/16 _0  BY V.WILKINS   Hepatic function panel     Status: Abnormal   Collection Time: 07/18/16  9:47 AM  Result Value Ref Range   Total Protein 6.8 6.5 - 8.1 g/dL   Albumin 2.7 (L) 3.5 - 5.0 g/dL   AST 24 15 - 41 U/L   ALT 20 17 - 63 U/L   Alkaline Phosphatase 40 38 - 126 U/L   Total Bilirubin 0.8 0.3 - 1.2 mg/dL   Bilirubin, Direct 0.2 0.1 - 0.5 mg/dL   Indirect Bilirubin 0.6 0.3 - 0.9 mg/dL  Lipase, blood     Status: None   Collection Time: 07/18/16  9:47 AM  Result Value Ref Range   Lipase 29 11 - 51 U/L  Glucose, capillary     Status: Abnormal   Collection Time: 07/18/16 11:17 AM  Result Value Ref Range   Glucose-Capillary 177 (H) 65 - 99 mg/dL  Glucose, capillary     Status: Abnormal   Collection Time: 07/18/16  4:34 PM  Result Value Ref Range   Glucose-Capillary 134 (H) 65 - 99 mg/dL  Glucose, capillary     Status: Abnormal   Collection Time: 07/18/16 10:23 PM  Result Value Ref Range   Glucose-Capillary 138 (H) 65 - 99 mg/dL  Protime-INR     Status: Abnormal   Collection Time: 07/19/16  2:51 AM  Result Value Ref Range   Prothrombin Time 30.0 (H) 11.4 - 15.2 seconds   INR 2.79   CBC     Status: Abnormal   Collection Time: 07/19/16  2:51 AM  Result Value Ref Range   WBC 5.9 4.0 - 10.5 K/uL   RBC 3.40 (L)  4.22 - 5.81 MIL/uL   Hemoglobin 11.9 (L) 13.0 - 17.0 g/dL   HCT 35.1 (L) 39.0 - 52.0 %   MCV 103.2 (H) 78.0 - 100.0 fL   MCH 35.0 (H) 26.0 - 34.0 pg   MCHC 33.9 30.0 - 36.0 g/dL   RDW 13.3 11.5 - 15.5 %   Platelets 169 150 - 400 K/uL  Comprehensive metabolic panel     Status: Abnormal   Collection Time: 07/19/16  2:51 AM  Result Value  Ref Range   Sodium 137 135 - 145 mmol/L   Potassium 3.5 3.5 - 5.1 mmol/L   Chloride 106 101 - 111 mmol/L   CO2 24 22 - 32 mmol/L   Glucose, Bld 113 (H) 65 - 99 mg/dL   BUN 15 6 - 20 mg/dL   Creatinine, Ser 1.00 0.61 - 1.24 mg/dL   Calcium 8.6 (L) 8.9 - 10.3 mg/dL   Total Protein 6.5 6.5 - 8.1 g/dL   Albumin 2.9 (L) 3.5 - 5.0 g/dL   AST 22 15 - 41 U/L   ALT 18 17 - 63 U/L   Alkaline Phosphatase 42 38 - 126 U/L   Total Bilirubin 0.9 0.3 - 1.2 mg/dL   GFR calc non Af Amer >60 >60 mL/min   GFR calc Af Amer >60 >60 mL/min    Comment: (NOTE) The eGFR has been calculated using the CKD EPI equation. This calculation has not been validated in all clinical situations. eGFR's persistently <60 mL/min signify possible Chronic Kidney Disease.    Anion gap 7 5 - 15  Glucose, capillary     Status: Abnormal   Collection Time: 07/19/16  8:01 AM  Result Value Ref Range   Glucose-Capillary 115 (H) 65 - 99 mg/dL     Lipid Panel     Component Value Date/Time   CHOL 122 05/20/2016 0101   TRIG 70 05/20/2016 0101   HDL 48 05/20/2016 0101   CHOLHDL 2.5 05/20/2016 0101   VLDL 14 05/20/2016 0101   LDLCALC 60 05/20/2016 0101     Lab Results  Component Value Date   HGBA1C 6.7 (H) 05/20/2016   HGBA1C 6.5 (H) 11/03/2015   HGBA1C 6.4 (H) 08/10/2015     Lab Results  Component Value Date   LDLCALC 60 05/20/2016   CREATININE 1.00 07/19/2016     HPI :  83 year old male with history of CAD status post PCI with DES to the LAD in 2012, NSTEMI in 2015, and recent cath a couple weeks ago showing widely patent LAD stent but 80% stenosis to the second diag and  50% stenosis to the RCA, who presents with chest tightness. Patient reports having intermittent chest tightness  requiring foursublingual nitroglycerin today, which is very unusual for him.   He also has a history of paroxysmal A. fib and is on Coumadin. No other aggravating or relieving factors or associated symptoms. He is currently pain-free.  HOSPITAL COURSE:   Chest pain, atypical CAD s/p DES to mLAD in 2012 Cardiac enzymes unchanged 0.03 times 3 , telemetry shows few PVCs Patient is status postcardiac cath on 05/20/16-showing widely patent LAD stent with 80% second diag lesion. He was continued on medical therapy and his coumadin resumed. Noted hesitancy to place on aspirin/Plavix given GI bleed in the past and hx of myelodysplastic syndrome.  Does have a remote hx of brief GI bleed in 5/16 that resolved, seen by GI with no invasive work up. -Continue carvedilol, atorvastatin, lisinopril, Imdurand Ranexa. Patient was evaluated by cardiology during this admission and the recommendations were carried out  Today his chest pain has resolved. Increased  his imdur to 120 mg daily. He can be safely discharged home today per cardiology . Plan follow-up with Dr. [name] as an outpatient   PAF/PVC's  - Continue coumadin. Currently normal sinus rhythm with PVCs Repeat ECHO to r/o cardiomyopathy ? Defer to cardiology INR supra therapeutic 4.58 ,  Coumadin held for 2 days, INR now 2.79    HTN - Stable  on current medications.     HLD -05/20/2016: Cholesterol 122; HDL 48; LDL Cholesterol 60; Triglycerides 70; VLDL 14 - Continue statin.   Diabetes mellitus Continue home medications. Most recent hemoglobin A1c 6.7 on 05/20/16.  Barrett's esophagus -continue PPI  History of Colon Cancer mets to lymph nodes - "graduated" from oncology surveillance in 2015     Discharge Exam:  Blood pressure (!) 172/92, pulse 90, temperature 98.3 F (36.8 C), temperature source Oral, resp. rate 18,  height 5' 9" (1.753 m), weight 66.8 kg (147 lb 3.2 oz), SpO2 94 %.  General exam: Appears calm and comfortable  Respiratory system: Clear to auscultation. Respiratory effort normal. Cardiovascular system: S1 & S2 heard, RRR. No JVD, murmurs, rubs, gallops or clicks. No pedal edema. Gastrointestinal system: Abdomen is nondistended, soft and nontender. No organomegaly or masses felt. Normal bowel sounds heard. Central nervous system: Alert and oriented. No focal neurological deficits. Extremities: Symmetric 5 x 5 power. Skin: No rashes, lesions or ulcers Psychiatry: Judgement and insight appear normal. Mood & affect appropriate.     Follow-up Information    Irven Shelling, MD. Call.   Specialty:  Internal Medicine Why:  To establish appointment, you need INR check on 3/13, 3/15 Contact information: [address] [address] [phone]           Signed: Reyne Dumas 07/19/2016, 8:48 AM        Time spent >45 mins

## 2016-07-20 ENCOUNTER — Other Ambulatory Visit: Payer: Self-pay | Admitting: Cardiovascular Disease

## 2016-07-21 NOTE — Telephone Encounter (Signed)
Approved    Disp Refills Start End  carvedilol (COREG) 25 MG tablet 90 tablet 3 07/15/2016 07/15/2017  Sig - Route:  Take 0.5 tablets (12.5 mg total) by mouth 2 (two) times daily with a meal. - Oral  Class:  Normal  DAW:  No      Authorizing Provider:  Thayer Headings, MD  Ordering User:  Emmaline Life, RN  Visit Pharmacy   [facility] [address] [place] - [place] [address]

## 2016-08-03 ENCOUNTER — Ambulatory Visit (INDEPENDENT_AMBULATORY_CARE_PROVIDER_SITE_OTHER): Payer: Medicare Other | Admitting: Cardiovascular Disease

## 2016-08-03 ENCOUNTER — Encounter: Payer: Self-pay | Admitting: Cardiovascular Disease

## 2016-08-03 VITALS — BP 130/66 | HR 88 | Ht 69.0 in | Wt 153.0 lb

## 2016-08-03 DIAGNOSIS — I10 Essential (primary) hypertension: Secondary | ICD-10-CM

## 2016-08-03 DIAGNOSIS — I25118 Atherosclerotic heart disease of native coronary artery with other forms of angina pectoris: Secondary | ICD-10-CM

## 2016-08-03 NOTE — Progress Notes (Signed)
Cardiology Office Note   Date:  08/03/2016   ID:  [name], DOB 03/01/1924, MRN [id number]  PCP:  [name] Shelling, MD  Cardiologist:   [name] Moores, MD   Chief Complaint  Patient presents with  . Coronary Artery Disease  . Hyperlipidemia  . Hypertension   1. CAD - s/p stenting July 2012.  2. Hypertension  3. Hyperlipidemia  4. Diabetes Mellitus  5. Paroxysmal atrial fib.   6. Lower GI bleed -   previous notes:   [name] is seen today for a 2 week check. He has known CAD with prior PCI to the LAD last year. Other issues include chronic PVC's and PACs, HTN, DM, HLD and systolic heart failure. He now has atrial fib and will be managed with rate control and anticoagulation. He had a failed attempt at [place] in March due to inability to pass the probe into the esophagus. His EF is 35 to 40% with mild to moderate AS.  His INR has been therapeutic. He really is not interested in doing cardioversion.  December 23, 2011 - he was initially seen in the emergency room. He has progressive back pain associated with increasing shortness of breath. [name] noticed that if he takes a nitroglycerin at this back pain and his shortness of breath improved. He has these discomforts with minor levels of exertion. He is also having at rest.  March 20, 2012: [name] presented with some episodes of chest pain when I last saw him in August. He had a stress Myoview study that was normal. He still walks almost every day - he goes around [employer] 1-1 1/2 miles a day. He had a brief episode of CP yesterday - relieved with NTG. He had some food poisoning recently ( raw oysters)  Oct 03, 2012:  He has been having some mid abdominal pain with radiation around to the back. He took a NTG with relief. The pain was a dull pain and would last 30 minutes or so. He has been putting in his garden without any without any problems.   Oct. 1, 2014:  [name] is doing ok. He is having  problems with his memory. Has trouble getting the words out sometimes. No CVA symptoms. No focal neuro findings.  No  CP or dyspnea. 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   August 10, 2013:  [name] has been having some chest tightness . Occurs 1-2 times a week. The chest tightness is relieved if he takes a nitroglycerin. he also crushes up a full strength aspirin and takes that. The pains occur with rest. Not associated with eating Or drinking anything. Similar to his previous epidoses of CP but are not as severe.  December 24, 2013:  [name] has been hospitalized since I last saw him. He had some chest pain and a followup cardiac catheterization revealed that his stents were okay. [name] been on Ranexa but complains of the cost is a bit high. The Ranexa seems to be helping quite a bit.    Feb. 18, 2016:    [name] is a 83 y.o. male who presents for follow up of his CAD Fells great.  Has been feeling better on the Ranexa.  Wants to cut the Ranexa to every other day.   Enjoying life.  Not exercising much these days.  Gets his INR levels drawn at his primary medical doctors office.   Sept. 23, 2016: [name] is feeling great. No  CP No dyspnea  BP has been well controlled   Had some bloody stools  - has resolved.   July 30, 2015 Overall doing the same Has occasional CP , takes SL NTG once a week .  Seems to help Does not occur with exertion  Has not been walking much recently  -   October 10, 2015:  Doing ok. Having some left sided chest pain .   Has taken some SL NTG. Also has had some unrelated shoulder pain  Takes the SL NTG almost daily .  Is on Imdur 90 mg a day  And Ranexa 1000  mg BID   Not necessarily due to exertion.     Oct. 16, 2017  Overall doing well. Has some occasional CP, is on Ranexa Is in the doughnut hole and cannot afford it  Does not have to take SL NTG   August 03, 2016:  [name] is seen today   [name] had a heart catheterization in January which showed   The left ventricular systolic function is normal.  LV end diastolic pressure is normal.  Prox LAD to Mid LAD lesion, 0 %stenosed.  2nd Diag lesion, 80 %stenosed.  Ost RCA lesion, 50 %stenosed.   His isosorbide was supposed to be increased. He was seen again on March 12 and the hospital and it appeared that he had not increased his isosorbide. It was recommended that he increase his isosorbide at that time.  Still having intermittent chest pain  Has episodes of weakness.  Takes NTG on occasion - this seems to relieve the CP  Still lives independently .  Able to do his usual chores.  Complains of general weakness  Past Medical History:  Diagnosis Date  . Aortic stenosis    a. Mild-mod by echo 2013.  . Barrett esophagus   . Chronic anticoagulation   . Chronic systolic CHF (congestive heart failure) (HCC)    a. EF previously 35-40%. b. improved to 55-65% by cath 08/2013.  [name] Colon cancer ([place])    a. 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 b.  "graduated" from their practice 06/2013, remains free of any new disease per those notes.  . Coronary artery disease    a. s/p PCI w/ DES to mLAD 08/05/10. b. NSTEMI 08/2013 (mildly elev trop) - cath showing widely patent stent, 80% prox D1 (small vessel) with recommendation for medical management, residual 60% ostial PDA, <20% LCx, LVEF 55-65%.   . Depression   . Gallstone pancreatitis 2011   a. s/p chole.  [name] GERD (gastroesophageal reflux disease)   . Hyperlipidemia   . Hypertension   . Macrocytic anemia 06/25/2011  . Myelodysplastic disease ([place])    a. Suspected low-grade  myelodysplastic disease per onc notes.  . Other pancytopenia ([place]) 06/18/2014   Mild, fluctuating, likely med related. Rule out early MDS from prior chemo/RT or colon cancer  . PAF (paroxysmal atrial fibrillation) ([place])    a. failed TEE due to inability to pass probe into the esophagus in March 2013.  b. On Coumadin. Was in NSR 08/2013 admission.  . Premature atrial contractions   . Premature ventricular contractions   . SBO (small bowel obstruction) 2005  . Type II diabetes mellitus ([place])     Past Surgical History:  Procedure Laterality Date  . ABDOMINAL MASS RESECTION     Mass near mesentery  . APPENDECTOMY     "took out during colon surgery"  . CARDIAC CATHETERIZATION  08/03/10  . CARDIAC CATHETERIZATION  08/09/10   LAD 30, stent OK, CFX 40, RCA < 20  . CARDIAC CATHETERIZATION N/A 05/20/2016   Procedure: Left Heart Cath and Coronary Angiography;  Surgeon: Lorretta Harp, MD;  Location: [facility];  Service: Cardiovascular;  Laterality: N/A;  . CATARACT EXTRACTION Bilateral   . CHOLECYSTECTOMY  2011  . COLON SURGERY    . CORONARY ANGIOPLASTY WITH STENT PLACEMENT  08/05/10   DES to the LAD  . CYSTOSCOPY     "with removal of kidney stone in office"  . CYSTOSCOPY WITH RETROGRADE PYELOGRAM, URETEROSCOPY AND STENT PLACEMENT Bilateral 05/18/2013   Procedure: CYSTOSCOPY WITH BILATERAL RETROGRADE PYELOGRAM, LEFT URETEROSCOPY AND LEFT STENT PLACEMENT;  Surgeon: Alexis Frock, MD;  Location: [facility];  Service: Urology;  Laterality: Bilateral;  . LEFT HEART CATHETERIZATION WITH CORONARY ANGIOGRAM N/A 08/15/2013   Procedure: LEFT HEART CATHETERIZATION WITH CORONARY ANGIOGRAM;  Surgeon: Peter M Martinique, MD;  Location: [facility];  Service: Cardiovascular;  Laterality: N/A;  . RIGHT COLECTOMY    . TONSILLECTOMY       Current Outpatient Prescriptions  Medication Sig Dispense Refill  . atorvastatin (LIPITOR) 40 MG tablet TAKE ONE TABLET BY MOUTH ONCE DAILY AT  6  PM (Patient taking differently: TAKE ONE TABLET BY MOUTH ONCE DAILY) 90 tablet 2  . B Complex-C (B-COMPLEX WITH VITAMIN C) tablet Take 1 tablet by mouth daily.    . carvedilol (COREG) 25 MG tablet Take 0.5 tablets (12.5 mg total) by mouth 2 (two) times daily with a meal. 90 tablet 3  . Cholecalciferol (VITAMIN D-3 PO) Take 1  tablet by mouth daily.     [name] donepezil (ARICEPT) 10 MG tablet Take 10 mg by mouth at bedtime.     . finasteride (PROSCAR) 5 MG tablet Take 5 mg by mouth at bedtime.     [name] glimepiride (AMARYL) 1 MG tablet Take 1 mg by mouth daily.     . isosorbide mononitrate (IMDUR) 120 MG 24 hr tablet Take 1 tablet (120 mg total) by mouth daily. 30 tablet 1  . linagliptin (TRADJENTA) 5 MG TABS tablet Take 1 tablet (5 mg total) by mouth daily. For blood sugar 30 tablet 1  . lisinopril (PRINIVIL,ZESTRIL) 5 MG tablet Take 1 tablet (5 mg total) by mouth daily. 30 tablet 6  . nitroGLYCERIN (NITROSTAT) 0.4 MG SL tablet Place 1 tablet (0.4 mg total) under the tongue every 5 (five) minutes as needed for chest pain. 100 tablet 5  . OVER THE COUNTER MEDICATION Place 1 drop into both eyes 2 (two) times daily as needed (dry eyes).    . pantoprazole (PROTONIX) 40 MG tablet Take 1 tablet (40 mg total) by mouth daily. 30 tablet 1  . ranolazine (RANEXA) 1000 MG SR tablet Take 1,000 mg by mouth 2 (two) times daily.     [name] warfarin (COUMADIN) 4 MG tablet Take 0.5-1 tablets (2-4 mg total) by mouth daily. Take 4mg  for the next 3 days (until 05/24/16), then resume regular dosing.   Take 2 mg on Tues / Thurs ** Take 4 mg on Sun / Mon / Wed / Fri / Sat (Patient taking differently: Take 4 mg by mouth daily after breakfast. )     No current facility-administered medications for this visit.     Allergies:   Metformin and related    Social History:  The patient  reports that he quit smoking about 22 years ago. His smoking use included Cigarettes. He has a 30.00 pack-year smoking history.  He has never used smokeless tobacco. He reports that he drinks about 1.2 oz of alcohol per week . He reports that he does not use drugs.   Family History:  The patient's family history includes Cancer in his mother; Heart attack (age of onset: 51) in his brother; Ulcers (age of onset: 41) in his father.    ROS:  Please see the history of present  illness.    Review of Systems: Constitutional:  denies fever, chills, diaphoresis, appetite change and fatigue.  HEENT: denies photophobia, eye pain, redness, hearing loss, ear pain, congestion, sore throat, rhinorrhea, sneezing, neck pain, neck stiffness and tinnitus.  Respiratory: denies SOB, DOE, cough, chest tightness, and wheezing.  Cardiovascular: denies chest pain, palpitations and leg swelling.  Gastrointestinal: denies nausea, vomiting, abdominal pain, diarrhea, constipation, blood in stool.  Genitourinary: denies dysuria, urgency, frequency, hematuria, flank pain and difficulty urinating.  Musculoskeletal: denies  myalgias, back pain, joint swelling, arthralgias and gait problem.   Skin: denies pallor, rash and wound.  Neurological: denies dizziness, seizures, syncope, weakness, light-headedness, numbness and headaches.   Hematological: denies adenopathy, easy bruising, personal or family bleeding history.  Psychiatric/ Behavioral: denies suicidal ideation, mood changes, confusion, nervousness, sleep disturbance and agitation.       All other systems are reviewed and negative.    PHYSICAL EXAM: VS:  BP 130/66 (BP Location: Right Arm, Patient Position: Sitting, Cuff Size: Normal)   Pulse 88   Ht 5\' 9"  (1.753 m)   Wt 153 lb (69.4 kg)   SpO2 90%   BMI 22.59 kg/m  , BMI Body mass index is 22.59 kg/m. GEN: Well nourished, well developed, in no acute distress  HEENT: normal  Neck: no JVD, soft left carotid bruit  , no masses Cardiac: RR  Frequent premature beats. ;  Soft systolic  murmur, rubs, or gallops,no edema  Respiratory:  clear to auscultation bilaterally,   GI: soft, nontender, nondistended, + BS MS: no deformity or atrophy  Skin: warm and dry, no rash Neuro:  Strength and sensation are intact Psych: normal   EKG:  EKG is not ordered today.   Recent Labs: 08/10/2015: B Natriuretic Peptide 223.9 05/20/2016: TSH 2.691 07/19/2016: ALT 18; BUN 15; Creatinine, Ser  1.00; Hemoglobin 11.9; Platelets 169; Potassium 3.5; Sodium 137    Lipid Panel    Component Value Date/Time   CHOL 122 05/20/2016 0101   TRIG 70 05/20/2016 0101   HDL 48 05/20/2016 0101   CHOLHDL 2.5 05/20/2016 0101   VLDL 14 05/20/2016 0101   LDLCALC 60 05/20/2016 0101      Wt Readings from Last 3 Encounters:  08/03/16 153 lb (69.4 kg)  07/19/16 147 lb 3.2 oz (66.8 kg)  06/02/16 155 lb (70.3 kg)      Other studies Reviewed: Additional studies/ records that were reviewed today include: . Review of the above records demonstrates:    ASSESSMENT AND PLAN:  1. CAD - s/p stenting July 2012.  - he continues to have angina.   Has has diffuse irregularities as well as several stents.    On Imdur 120 mg a day and Ranexa.  Takes NTG on occasion  He has a hx of GI bleed.    I hesitate to add  plavix - he is currently on coumadin .   2. Hypertension - pressure is well-controlled. Continue same meds.   3. Hyperlipidemia - stable   4. Diabetes Mellitus   5. Lower GI bleed. He was admitted recently with a lower  GI bleed. He is on Coumadin for paroxysmal atrial fibrillation. His INR levels have been monitored by his primary medical doctor. GI bleeding has resolved.   6. Left carotid bruit .   Carotid doppler - shows bilateral mild Carotid disease. No further eval at this point   Current medicines are reviewed at length with the patient today.  The patient does not have concerns regarding medicines.  The following changes have been made:  no change   Disposition:   FU with me in 6 months      [name] Moores, MD  08/03/2016 11:33 AM    [address], [place], [place]  [address] Phone: [phone]; Fax: [phone]

## 2016-08-03 NOTE — Patient Instructions (Signed)
Medication Instructions:  Your physician recommends that you continue on your current medications as directed. Please refer to the Current Medication list given to you today.   Labwork: None Ordered   Testing/Procedures: None Ordered   Follow-Up: Your physician wants you to follow-up in: 6 months with Dr. Nahser.  You will receive a reminder letter in the mail two months in advance. If you don't receive a letter, please call our office to schedule the follow-up appointment.   If you need a refill on your cardiac medications before your next appointment, please call your pharmacy.   Thank you for choosing [facility]! Melady Chow, RN [phone]    

## 2016-08-10 ENCOUNTER — Ambulatory Visit
Admission: RE | Admit: 2016-08-10 | Discharge: 2016-08-10 | Disposition: A | Discharge status: Home / Self Care | Payer: Medicare Other | Source: Ambulatory Visit | Attending: Internal Medicine | Admitting: Internal Medicine

## 2016-08-10 ENCOUNTER — Other Ambulatory Visit: Payer: Self-pay | Admitting: Internal Medicine

## 2016-08-10 DIAGNOSIS — R9389 Abnormal findings on diagnostic imaging of other specified body structures: Secondary | ICD-10-CM

## 2016-08-14 ENCOUNTER — Other Ambulatory Visit: Payer: Self-pay | Admitting: Cardiovascular Disease

## 2016-08-19 ENCOUNTER — Other Ambulatory Visit: Payer: Self-pay | Admitting: Cardiovascular Disease

## 2016-08-31 ENCOUNTER — Observation Stay (HOSPITAL_COMMUNITY)
Admission: EM | Admit: 2016-08-31 | Discharge: 2016-09-02 | Disposition: A | Discharge status: Home / Self Care | Payer: Medicare Other | Attending: Internal Medicine | Admitting: Internal Medicine

## 2016-08-31 ENCOUNTER — Encounter (HOSPITAL_COMMUNITY): Payer: Self-pay | Admitting: Emergency Medicine

## 2016-08-31 ENCOUNTER — Emergency Department (HOSPITAL_COMMUNITY): Payer: Medicare Other

## 2016-08-31 DIAGNOSIS — Z955 Presence of coronary angioplasty implant and graft: Secondary | ICD-10-CM | POA: Diagnosis not present

## 2016-08-31 DIAGNOSIS — Z79899 Other long term (current) drug therapy: Secondary | ICD-10-CM | POA: Insufficient documentation

## 2016-08-31 DIAGNOSIS — I119 Hypertensive heart disease without heart failure: Secondary | ICD-10-CM | POA: Diagnosis not present

## 2016-08-31 DIAGNOSIS — R079 Chest pain, unspecified: Secondary | ICD-10-CM | POA: Diagnosis present

## 2016-08-31 DIAGNOSIS — R531 Weakness: Secondary | ICD-10-CM

## 2016-08-31 DIAGNOSIS — I110 Hypertensive heart disease with heart failure: Secondary | ICD-10-CM | POA: Insufficient documentation

## 2016-08-31 DIAGNOSIS — N400 Enlarged prostate without lower urinary tract symptoms: Secondary | ICD-10-CM | POA: Insufficient documentation

## 2016-08-31 DIAGNOSIS — Z888 Allergy status to other drugs, medicaments and biological substances status: Secondary | ICD-10-CM | POA: Insufficient documentation

## 2016-08-31 DIAGNOSIS — I5042 Chronic combined systolic (congestive) and diastolic (congestive) heart failure: Secondary | ICD-10-CM | POA: Insufficient documentation

## 2016-08-31 DIAGNOSIS — I2510 Atherosclerotic heart disease of native coronary artery without angina pectoris: Secondary | ICD-10-CM

## 2016-08-31 DIAGNOSIS — I480 Paroxysmal atrial fibrillation: Secondary | ICD-10-CM | POA: Diagnosis present

## 2016-08-31 DIAGNOSIS — I200 Unstable angina: Secondary | ICD-10-CM

## 2016-08-31 DIAGNOSIS — C779 Secondary and unspecified malignant neoplasm of lymph node, unspecified: Secondary | ICD-10-CM | POA: Diagnosis not present

## 2016-08-31 DIAGNOSIS — D649 Anemia, unspecified: Secondary | ICD-10-CM | POA: Diagnosis present

## 2016-08-31 DIAGNOSIS — I25119 Atherosclerotic heart disease of native coronary artery with unspecified angina pectoris: Principal | ICD-10-CM | POA: Insufficient documentation

## 2016-08-31 DIAGNOSIS — C189 Malignant neoplasm of colon, unspecified: Secondary | ICD-10-CM | POA: Diagnosis not present

## 2016-08-31 DIAGNOSIS — I10 Essential (primary) hypertension: Secondary | ICD-10-CM | POA: Diagnosis not present

## 2016-08-31 DIAGNOSIS — I5020 Unspecified systolic (congestive) heart failure: Secondary | ICD-10-CM | POA: Diagnosis present

## 2016-08-31 DIAGNOSIS — E119 Type 2 diabetes mellitus without complications: Secondary | ICD-10-CM

## 2016-08-31 DIAGNOSIS — C772 Secondary and unspecified malignant neoplasm of intra-abdominal lymph nodes: Secondary | ICD-10-CM

## 2016-08-31 DIAGNOSIS — Z7901 Long term (current) use of anticoagulants: Secondary | ICD-10-CM | POA: Diagnosis not present

## 2016-08-31 DIAGNOSIS — I4510 Unspecified right bundle-branch block: Secondary | ICD-10-CM | POA: Diagnosis not present

## 2016-08-31 DIAGNOSIS — Z87891 Personal history of nicotine dependence: Secondary | ICD-10-CM | POA: Insufficient documentation

## 2016-08-31 DIAGNOSIS — I5022 Chronic systolic (congestive) heart failure: Secondary | ICD-10-CM | POA: Diagnosis not present

## 2016-08-31 DIAGNOSIS — I2583 Coronary atherosclerosis due to lipid rich plaque: Secondary | ICD-10-CM | POA: Diagnosis not present

## 2016-08-31 DIAGNOSIS — E785 Hyperlipidemia, unspecified: Secondary | ICD-10-CM | POA: Insufficient documentation

## 2016-08-31 LAB — URINALYSIS, ROUTINE W REFLEX MICROSCOPIC
BILIRUBIN URINE: NEGATIVE
Bacteria, UA: NONE SEEN
Glucose, UA: NEGATIVE mg/dL
Hgb urine dipstick: NEGATIVE
Ketones, ur: NEGATIVE mg/dL
Leukocytes, UA: NEGATIVE
Nitrite: NEGATIVE
PH: 5 (ref 5.0–8.0)
Protein, ur: 100 mg/dL — AB
SPECIFIC GRAVITY, URINE: 1.025 (ref 1.005–1.030)

## 2016-08-31 LAB — COMPREHENSIVE METABOLIC PANEL
ALT: 14 U/L — AB (ref 17–63)
AST: 25 U/L (ref 15–41)
Albumin: 3 g/dL — ABNORMAL LOW (ref 3.5–5.0)
Alkaline Phosphatase: 44 U/L (ref 38–126)
Anion gap: 6 (ref 5–15)
BUN: 16 mg/dL (ref 6–20)
CHLORIDE: 106 mmol/L (ref 101–111)
CO2: 23 mmol/L (ref 22–32)
CREATININE: 1.29 mg/dL — AB (ref 0.61–1.24)
Calcium: 8.6 mg/dL — ABNORMAL LOW (ref 8.9–10.3)
GFR calc non Af Amer: 47 mL/min — ABNORMAL LOW (ref >60)
GFR, EST AFRICAN AMERICAN: 54 mL/min — AB (ref >60)
Glucose, Bld: 206 mg/dL — ABNORMAL HIGH (ref 65–99)
Potassium: 4.1 mmol/L (ref 3.5–5.1)
SODIUM: 135 mmol/L (ref 135–145)
Total Bilirubin: 0.8 mg/dL (ref 0.3–1.2)
Total Protein: 6.2 g/dL — ABNORMAL LOW (ref 6.5–8.1)

## 2016-08-31 LAB — CBC WITH DIFFERENTIAL/PLATELET
Basophils Absolute: 0 10*3/uL (ref 0.0–0.1)
Basophils Relative: 0 %
Eosinophils Absolute: 0.1 10*3/uL (ref 0.0–0.7)
Eosinophils Relative: 1 %
HEMATOCRIT: 34.8 % — AB (ref 39.0–52.0)
HEMOGLOBIN: 11.6 g/dL — AB (ref 13.0–17.0)
LYMPHS ABS: 0.6 10*3/uL — AB (ref 0.7–4.0)
Lymphocytes Relative: 9 %
MCH: 34.7 pg — AB (ref 26.0–34.0)
MCHC: 33.3 g/dL (ref 30.0–36.0)
MCV: 104.2 fL — AB (ref 78.0–100.0)
MONOS PCT: 9 %
Monocytes Absolute: 0.6 10*3/uL (ref 0.1–1.0)
NEUTROS PCT: 81 %
Neutro Abs: 5 10*3/uL (ref 1.7–7.7)
Platelets: 170 10*3/uL (ref 150–400)
RBC: 3.34 MIL/uL — ABNORMAL LOW (ref 4.22–5.81)
RDW: 13.2 % (ref 11.5–15.5)
WBC: 6.3 10*3/uL (ref 4.0–10.5)

## 2016-08-31 LAB — TROPONIN I: Troponin I: 0.04 ng/mL (ref <0.03)

## 2016-08-31 LAB — PROTIME-INR
INR: 1.57
Prothrombin Time: 19 seconds — ABNORMAL HIGH (ref 11.4–15.2)

## 2016-08-31 LAB — I-STAT TROPONIN, ED: TROPONIN I, POC: 0.01 ng/mL (ref 0.00–0.08)

## 2016-08-31 LAB — GLUCOSE, CAPILLARY: Glucose-Capillary: 154 mg/dL — ABNORMAL HIGH (ref 65–99)

## 2016-08-31 MED ORDER — RANOLAZINE ER 500 MG PO TB12: 1000.0000 mg | ORAL_TABLET | Freq: Two times a day (BID) | ORAL | Status: DC

## 2016-08-31 MED ORDER — ONDANSETRON HCL 4 MG/2ML IJ SOLN: 4.0000 mg | Freq: Four times a day (QID) | INTRAMUSCULAR | Status: DC | PRN

## 2016-08-31 MED ORDER — WARFARIN - PHARMACIST DOSING INPATIENT: Freq: Every day | Status: DC

## 2016-08-31 MED ORDER — HEPARIN (PORCINE) IN NACL 100-0.45 UNIT/ML-% IJ SOLN
900.0000 [IU]/h | INTRAMUSCULAR | Status: DC
  Administered 2016-08-31: 1100 [IU]/h via INTRAVENOUS
  Filled 2016-08-31: qty 250

## 2016-08-31 MED ORDER — VITAMIN D 1000 UNITS PO TABS
1000.0000 [IU] | ORAL_TABLET | Freq: Every day | ORAL | Status: DC
  Administered 2016-09-01 – 2016-09-02 (×2): 1000 [IU] via ORAL
  Filled 2016-08-31 (×2): qty 1

## 2016-08-31 MED ORDER — FINASTERIDE 5 MG PO TABS
5.0000 mg | ORAL_TABLET | Freq: Every day | ORAL | Status: DC
  Administered 2016-08-31 – 2016-09-01 (×2): 5 mg via ORAL
  Filled 2016-08-31 (×2): qty 1

## 2016-08-31 MED ORDER — RANOLAZINE ER 500 MG PO TB12
1000.0000 mg | ORAL_TABLET | Freq: Two times a day (BID) | ORAL | Status: DC
Start: 2016-08-31 — End: 2016-09-02
  Administered 2016-08-31 – 2016-09-02 (×4): 1000 mg via ORAL
  Filled 2016-08-31 (×4): qty 2

## 2016-08-31 MED ORDER — LINAGLIPTIN 5 MG PO TABS
5.0000 mg | ORAL_TABLET | Freq: Every day | ORAL | Status: DC
  Administered 2016-09-01 – 2016-09-02 (×2): 5 mg via ORAL
  Filled 2016-08-31 (×2): qty 1

## 2016-08-31 MED ORDER — ATORVASTATIN CALCIUM 40 MG PO TABS
40.0000 mg | ORAL_TABLET | Freq: Every day | ORAL | Status: DC
  Administered 2016-08-31 – 2016-09-01 (×2): 40 mg via ORAL
  Filled 2016-08-31 (×2): qty 1

## 2016-08-31 MED ORDER — ISOSORBIDE MONONITRATE ER 30 MG PO TB24
120.0000 mg | ORAL_TABLET | Freq: Every day | ORAL | Status: DC
  Administered 2016-09-01 – 2016-09-02 (×2): 120 mg via ORAL
  Filled 2016-08-31 (×2): qty 4

## 2016-08-31 MED ORDER — LISINOPRIL 2.5 MG PO TABS
2.5000 mg | ORAL_TABLET | Freq: Every day | ORAL | Status: DC
  Administered 2016-09-02: 2.5 mg via ORAL
  Filled 2016-08-31: qty 1

## 2016-08-31 MED ORDER — PANTOPRAZOLE SODIUM 40 MG PO TBEC
40.0000 mg | DELAYED_RELEASE_TABLET | Freq: Every day | ORAL | Status: DC
  Administered 2016-09-01 – 2016-09-02 (×2): 40 mg via ORAL
  Filled 2016-08-31 (×2): qty 1

## 2016-08-31 MED ORDER — INSULIN ASPART 100 UNIT/ML ~~LOC~~ SOLN: 0.0000 [IU] | Freq: Every day | SUBCUTANEOUS | Status: DC

## 2016-08-31 MED ORDER — B COMPLEX-C PO TABS
1.0000 | ORAL_TABLET | Freq: Every day | ORAL | Status: DC
  Administered 2016-09-01 – 2016-09-02 (×2): 1 via ORAL
  Filled 2016-08-31 (×2): qty 1

## 2016-08-31 MED ORDER — WARFARIN SODIUM 4 MG PO TABS: 4.0000 mg | ORAL_TABLET | Freq: Once | ORAL | Status: DC

## 2016-08-31 MED ORDER — SODIUM CHLORIDE 0.9 % IV SOLN: INTRAVENOUS | Status: DC

## 2016-08-31 MED ORDER — CARVEDILOL 12.5 MG PO TABS
12.5000 mg | ORAL_TABLET | Freq: Two times a day (BID) | ORAL | Status: DC
  Administered 2016-09-01 – 2016-09-02 (×3): 12.5 mg via ORAL
  Filled 2016-08-31 (×3): qty 1

## 2016-08-31 MED ORDER — DONEPEZIL HCL 10 MG PO TABS
10.0000 mg | ORAL_TABLET | Freq: Every day | ORAL | Status: DC
  Administered 2016-08-31 – 2016-09-01 (×2): 10 mg via ORAL
  Filled 2016-08-31 (×2): qty 1

## 2016-08-31 MED ORDER — GI COCKTAIL ~~LOC~~: 30.0000 mL | Freq: Four times a day (QID) | ORAL | Status: DC | PRN

## 2016-08-31 MED ORDER — GLIMEPIRIDE 1 MG PO TABS
1.0000 mg | ORAL_TABLET | Freq: Every day | ORAL | Status: DC
  Administered 2016-09-01 – 2016-09-02 (×2): 1 mg via ORAL
  Filled 2016-08-31 (×2): qty 1

## 2016-08-31 MED ORDER — ENOXAPARIN SODIUM 40 MG/0.4ML ~~LOC~~ SOLN: 40.0000 mg | SUBCUTANEOUS | Status: DC

## 2016-08-31 MED ORDER — INSULIN ASPART 100 UNIT/ML ~~LOC~~ SOLN: 0.0000 [IU] | Freq: Three times a day (TID) | SUBCUTANEOUS | Status: DC

## 2016-08-31 MED ORDER — SODIUM CHLORIDE 0.9 % IV SOLN
INTRAVENOUS | Status: AC
  Administered 2016-09-01: via INTRAVENOUS

## 2016-08-31 MED ORDER — ACETAMINOPHEN 325 MG PO TABS: 650.0000 mg | ORAL_TABLET | ORAL | Status: DC | PRN

## 2016-08-31 MED ORDER — NITROGLYCERIN 0.4 MG SL SUBL: 0.4000 mg | SUBLINGUAL_TABLET | SUBLINGUAL | Status: DC | PRN

## 2016-08-31 NOTE — ED Provider Notes (Signed)
[facility] DEPT Provider Note   CSN: [id number] Arrival date & time: 08/31/16  1439     History   Chief Complaint Chief Complaint  Patient presents with  . Chest Pain    HPI [name] is a 83 y.o. male.  HPI Patient with history of coronary artery disease status post stent placement in 2012 presents with chest tightness this morning. Described as central and nonradiating. Associated with mild shortness of breath. No diaphoresis. States he took 5 nitroglycerin at home which did not improve the chest tightness as it normally does. Was also associated with generalized weakness and feeling "wobbly". Given aspirin in route by EMS. Patient states his tightness is now improved. Has ongoing cough but denies any fever chills. No new lower extremity swelling or pain. Past Medical History:  Diagnosis Date  . Aortic stenosis    a. Mild-mod by echo 2013.  . Barrett esophagus   . Chronic anticoagulation   . Chronic systolic CHF (congestive heart failure) (HCC)    a. EF previously 35-40%. b. improved to 55-65% by cath 08/2013.  [name] Colon cancer ([place])    a. 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 b.  "graduated" from their practice 06/2013, remains free of any new disease per those notes.  . Coronary artery disease    a. s/p PCI w/ DES to mLAD 08/05/10. b. NSTEMI 08/2013 (mildly elev trop) - cath showing widely patent stent, 80% prox D1 (small vessel) with recommendation for medical management, residual 60% ostial PDA, <20% LCx, LVEF 55-65%.   . Depression   . Gallstone pancreatitis 2011   a. s/p chole.  [name] GERD (gastroesophageal reflux disease)   . Hyperlipidemia   . Hypertension   . Macrocytic anemia 06/25/2011  . Myelodysplastic disease ([place])    a. Suspected low-grade  myelodysplastic disease per onc notes.  . Other pancytopenia ([place]) 06/18/2014   Mild, fluctuating, likely med related. Rule out early MDS from prior chemo/RT or colon cancer  . PAF (paroxysmal atrial  fibrillation) ([place])    a. failed TEE due to inability to pass probe into the esophagus in March 2013. b. On Coumadin. Was in NSR 08/2013 admission.  . Premature atrial contractions   . Premature ventricular contractions   . SBO (small bowel obstruction) ([place]) 2005  . Type II diabetes mellitus [facility])     Patient Active Problem List   Diagnosis Date Noted  . Barrett esophagus 07/17/2016  . Encounter for therapeutic drug monitoring 05/24/2016  . Malnutrition of moderate degree 05/20/2016  . Hypertensive heart disease without heart failure   . Pure hypercholesterolemia   . Chronic diastolic heart failure ([place])   . Near syncope 11/03/2015  . Chest discomfort 11/03/2015  . Transient Hypotension 11/03/2015  . Generalized weakness 11/03/2015  . Supratherapeutic INR 11/03/2015  . Hyperglycemia, unspecified 11/03/2015  . Anemia, unspecified 11/03/2015  . Chronic anticoagulation 11/03/2015  . Altered awareness, transient 11/03/2015  . Thrombocytopenia ([place]) 08/10/2015  . Non-insulin dependent type 2 diabetes mellitus ([place])   . Pain in the chest   . History of colon cancer, stage III 09/11/2014  . BRBPR (bright red blood per rectum) 09/11/2014  . Other pancytopenia ([place]) 06/18/2014  . Chronic systolic CHF (congestive heart failure) ([place]) 06/07/2014  . NSTEMI (non-ST elevated myocardial infarction) ([place]) 08/15/2013  . Chest pain 12/14/2012  . 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 Paroxysmal atrial fibrillation ([place]) 07/20/2011  . Cardiomyopathy ([place]) 07/20/2011  . SOB (shortness of  breath) 07/17/2011  . Macrocytic anemia 06/25/2011  . Bruising 09/18/2010  . Atherosclerosis of native coronary artery of native heart with stable angina pectoris ([place]) 08/13/2010  . Hypertension   . Hyperlipidemia     Past Surgical History:  Procedure Laterality Date  . ABDOMINAL MASS RESECTION     Mass near mesentery  . APPENDECTOMY     "took out during colon surgery"    . CARDIAC CATHETERIZATION  08/03/10  . CARDIAC CATHETERIZATION  08/09/10   LAD 30, stent OK, CFX 40, RCA < 20  . CARDIAC CATHETERIZATION N/A 05/20/2016   Procedure: Left Heart Cath and Coronary Angiography;  Surgeon: Lorretta Harp, MD;  Location: [place] [facility] CV LAB;  Service: Cardiovascular;  Laterality: N/A;  . CATARACT EXTRACTION Bilateral   . CHOLECYSTECTOMY  2011  . COLON SURGERY    . CORONARY ANGIOPLASTY WITH STENT PLACEMENT  08/05/10   DES to the LAD  . CYSTOSCOPY     "with removal of kidney stone in office"  . CYSTOSCOPY WITH RETROGRADE PYELOGRAM, URETEROSCOPY AND STENT PLACEMENT Bilateral 05/18/2013   Procedure: CYSTOSCOPY WITH BILATERAL RETROGRADE PYELOGRAM, LEFT URETEROSCOPY AND LEFT STENT PLACEMENT;  Surgeon: Alexis Frock, MD;  Location: [facility] ORS;  Service: Urology;  Laterality: Bilateral;  . LEFT HEART CATHETERIZATION WITH CORONARY ANGIOGRAM N/A 08/15/2013   Procedure: LEFT HEART CATHETERIZATION WITH CORONARY ANGIOGRAM;  Surgeon: Peter M Martinique, MD;  Location: [facility] CATH LAB;  Service: Cardiovascular;  Laterality: N/A;  . RIGHT COLECTOMY    . TONSILLECTOMY         Home Medications    Prior to Admission medications   Medication Sig Start Date End Date Taking? Authorizing Provider  atorvastatin (LIPITOR) 40 MG tablet TAKE ONE TABLET BY MOUTH ONCE DAILY AT  6  PM Patient taking differently: Take 40 mg by mouth once a day at 6 PM 01/16/16  Yes Thayer Headings, MD  B Complex-C (B-COMPLEX WITH VITAMIN C) tablet Take 1 tablet by mouth daily.   Yes Historical Provider, MD  carvedilol (COREG) 25 MG tablet Take 0.5 tablets (12.5 mg total) by mouth 2 (two) times daily with a meal. 07/15/16 07/15/17 Yes Thayer Headings, MD  Cholecalciferol (VITAMIN D-3 PO) Take 1 tablet by mouth daily.    Yes Historical Provider, MD  finasteride (PROSCAR) 5 MG tablet Take 5 mg by mouth at bedtime.  04/01/14  Yes Historical Provider, MD  glimepiride (AMARYL) 1 MG tablet Take 1 mg by mouth daily.  05/29/16  Yes  Historical Provider, MD  isosorbide mononitrate (IMDUR) 120 MG 24 hr tablet Take 1 tablet (120 mg total) by mouth daily. 07/20/16  Yes Reyne Dumas, MD  lisinopril (PRINIVIL,ZESTRIL) 5 MG tablet Take 1 tablet (5 mg total) by mouth daily. 05/22/16  Yes Cheryln Manly, NP  mupirocin ointment (BACTROBAN) 2 % Apply 1 application topically daily as needed (to affected area on forehead).  08/20/16  Yes Historical Provider, MD  nitroGLYCERIN (NITROSTAT) 0.4 MG SL tablet PLACE ONE TABLET UNDER THE TONGUE EVERY 5 MINUTES AS NEEDED FOR CHEST PAIN. 08/16/16  Yes Thayer Headings, MD  pantoprazole (PROTONIX) 40 MG tablet Take 1 tablet (40 mg total) by mouth daily. Patient taking differently: Take 40 mg by mouth at bedtime.  07/19/16  Yes Reyne Dumas, MD  Propylene Glycol (SYSTANE BALANCE) 0.6 % SOLN Apply 1-2 drops to eye 3 (three) times daily as needed (for dry eyes).   Yes Historical Provider, MD  RANEXA 1000 MG SR tablet TAKE ONE TABLET  BY MOUTH TWICE DAILY 08/20/16  Yes Thayer Headings, MD  warfarin (COUMADIN) 4 MG tablet Take 0.5-1 tablets (2-4 mg total) by mouth daily. Take 4mg  for the next 3 days (until 05/24/16), then resume regular dosing.   Take 2 mg on Tues / Thurs ** Take 4 mg on Sun / Mon / Wed / Fri / Sat Patient taking differently: Take 2-4 mg by mouth See admin instructions. 4 mg in the morning on Sun/Mon/Wed/Fri/Sat and 2 mg on Tues/Thurs 05/21/16  Yes Cheryln Manly, NP  donepezil (ARICEPT) 10 MG tablet Take 10 mg by mouth at bedtime.  05/04/16   Historical Provider, MD  linagliptin (TRADJENTA) 5 MG TABS tablet Take 1 tablet (5 mg total) by mouth daily. For blood sugar Patient not taking: Reported on 08/31/2016 11/04/15   [place] Marisa Hua, MD    Family History Family History  Problem Relation Age of Onset  . Cancer Mother   . Ulcers Father 73  . Heart attack Brother 48    Social History Social History  Substance Use Topics  . Smoking status: Former Smoker    Packs/day: 1.00     Years: 30.00    Types: Cigarettes    Quit date: 05/10/1994  . Smokeless tobacco: Never Used  . Alcohol use 1.2 oz/week    2 Shots of liquor per week     Comment: occasionally.     Allergies   Metformin and related   Review of Systems Review of Systems  Constitutional: Positive for fatigue. Negative for chills and fever.  Respiratory: Positive for chest tightness and shortness of breath. Negative for cough and wheezing.   Cardiovascular: Negative for chest pain, palpitations and leg swelling.  Gastrointestinal: Negative for abdominal pain, constipation, diarrhea, nausea and vomiting.  Genitourinary: Negative for dysuria, flank pain and frequency.  Musculoskeletal: Negative for back pain, myalgias, neck pain and neck stiffness.  Skin: Negative for rash and wound.  Neurological: Positive for dizziness, weakness (generalized) and light-headedness. Negative for syncope, numbness and headaches.     Physical Exam Updated Vital Signs BP 122/72 (BP Location: Right Arm)   Pulse 81   Temp 98.1 F (36.7 C) (Oral)   Resp 18   Ht 5\' 9"  (1.753 m)   Wt 193 lb 11.2 oz (87.9 kg)   SpO2 94%   BMI 28.60 kg/m   Physical Exam   ED Treatments / Results  Labs (all labs ordered are listed, but only abnormal results are displayed) Labs Reviewed  COMPREHENSIVE METABOLIC PANEL - Abnormal; Notable for the following:       Result Value   Glucose, Bld 206 (*)    Creatinine, Ser 1.29 (*)    Calcium 8.6 (*)    Total Protein 6.2 (*)    Albumin 3.0 (*)    ALT 14 (*)    GFR calc non Af Amer 47 (*)    GFR calc Af Amer 54 (*)    All other components within normal limits  CBC WITH DIFFERENTIAL/PLATELET - Abnormal; Notable for the following:    RBC 3.34 (*)    Hemoglobin 11.6 (*)    HCT 34.8 (*)    MCV 104.2 (*)    MCH 34.7 (*)    Lymphs Abs 0.6 (*)    All other components within normal limits  PROTIME-INR - Abnormal; Notable for the following:    Prothrombin Time 19.0 (*)    All other  components within normal limits  URINALYSIS, ROUTINE W REFLEX MICROSCOPIC -  Abnormal; Notable for the following:    Color, Urine AMBER (*)    APPearance HAZY (*)    Protein, ur 100 (*)    Squamous Epithelial / LPF 0-5 (*)    All other components within normal limits  TROPONIN I - Abnormal; Notable for the following:    Troponin I 0.04 (*)    All other components within normal limits  GLUCOSE, CAPILLARY - Abnormal; Notable for the following:    Glucose-Capillary 154 (*)    All other components within normal limits  TROPONIN I  HEMOGLOBIN A1C  PROTIME-INR  HEPARIN LEVEL (UNFRACTIONATED)  CBC  TROPONIN I  I-STAT TROPOININ, ED    EKG  EKG Interpretation  Date/Time:  Tuesday August 31 2016 14:46:27 EDT Ventricular Rate:  72 PR Interval:    QRS Duration: 141 QT Interval:  459 QTC Calculation: 503 R Axis:   112 Text Interpretation:  Sinus arrhythmia Right bundle branch block Confirmed by Stevey Stapleton  MD, Daijah Scrivens ([id number]) on 08/31/2016 4:43:25 PM       Radiology Dg Chest 2 View  Result Date: 08/31/2016 CLINICAL DATA:  Left-sided chest pain for 2 weeks with shortness of Breath EXAM: CHEST  2 VIEW COMPARISON:  08/10/2016 FINDINGS: Cardiac shadow is stable. The lungs again demonstrate bilateral fibrotic changes similar to that seen on the prior exam. The previously seen changes along the minor fissure in the upper lobe are less prominent on the current exam. Degenerative change of the thoracic spine is noted. IMPRESSION: Chronic changes bilaterally. Nearly completely resolved changes in the right upper lobe when compare with the prior exam. Electronically Signed   By: Inez Catalina M.D.   On: 08/31/2016 15:31    Procedures Procedures (including critical care time)  Medications Ordered in ED Medications  cholecalciferol (VITAMIN D) tablet 1,000 Units (not administered)  finasteride (PROSCAR) tablet 5 mg (5 mg Oral Given 08/31/16 2105)  linagliptin (TRADJENTA) tablet 5 mg (not  administered)  atorvastatin (LIPITOR) tablet 40 mg (40 mg Oral Given 08/31/16 2105)  donepezil (ARICEPT) tablet 10 mg (10 mg Oral Given 08/31/16 2105)  B-complex with vitamin C tablet 1 tablet (not administered)  lisinopril (PRINIVIL,ZESTRIL) tablet 2.5 mg (not administered)  glimepiride (AMARYL) tablet 1 mg (not administered)  carvedilol (COREG) tablet 12.5 mg (not administered)  ranolazine (RANEXA) 12 hr tablet 1,000 mg (1,000 mg Oral Given 08/31/16 2105)  pantoprazole (PROTONIX) EC tablet 40 mg (not administered)  isosorbide mononitrate (IMDUR) 24 hr tablet 120 mg (not administered)  nitroGLYCERIN (NITROSTAT) SL tablet 0.4 mg (not administered)  acetaminophen (TYLENOL) tablet 650 mg (not administered)  ondansetron (ZOFRAN) injection 4 mg (not administered)  0.9 %  sodium chloride infusion (not administered)  insulin aspart (novoLOG) injection 0-9 Units (not administered)  insulin aspart (novoLOG) injection 0-5 Units (0 Units Subcutaneous Not Given 08/31/16 2246)  Warfarin - Pharmacist Dosing Inpatient (not administered)  heparin ADULT infusion 100 units/mL (25000 units/272mL sodium chloride 0.45%) (1,100 Units/hr Intravenous New Bag/Given 08/31/16 2106)     Initial Impression / Assessment and Plan / ED Course  I have reviewed the triage vital signs and the nursing notes.  Pertinent labs & imaging results that were available during my care of the patient were reviewed by me and considered in my medical decision making (see chart for details).     Discussed with cardiology will consult on the patient. [facility] hospitalist to admit. Patient remains chest pain-free at this time.  Final Clinical Impressions(s) / ED Diagnoses   Final diagnoses:  Unstable  angina [facility])    New Prescriptions Current Discharge Medication List       Julianne Rice, MD 08/31/16 2321

## 2016-08-31 NOTE — Consult Note (Signed)
CARDIOLOGY CONSULT NOTE   Patient ID: [name] MRN: [id number] DOB/AGE: 11-Dec-1923 83 y.o.  Admit date: 08/31/2016  Primary Physician   Irven Shelling, MD Primary Cardiologist   Dr [name] [name] for Consultation   Chest pain Requesting MD: Dr Candiss Norse  HPI: [name] is a 83 y.o. male with hx of mild-mod AS, S-CHF w/ EF improved from 35%-55% by 2015, DES LAD 2012, NSTEMI 2015 w/ med rx for distal D1 80% and 60^ PDA  Pt is being seen today for the evaluation of Chest pain at the request of Dr Candiss Norse.  Pt had just gotten out of bed, when he had onset of chest tightness, 7/10. He took his nitro, 3-5 of them. The pain got a little better, but he got light-headed. He called 911, and got ASA x 5. In the ER, the tightness had improved, he is now feeling it only with deep inspiration.   He feels the symptoms are like what he had when he had his heart attack.   He has been getting tightness about every day for the last 2 weeks. He was relieving it with SL NTG, has not had to take more than 2-3 till today. When his chest gets tight, he gets weak and his head seems full. All these symptoms are relieved by the nitro.  He gets out daily to go to lunch, is otherwise not very active. No hx exertional symptoms. No orthopnea or PND, no LE edema. He used to work around the house, but has not done that recently because he did not have the stamina.    Past Medical History:  Diagnosis Date  . Aortic stenosis    a. Mild-mod by echo 2013.  . Barrett esophagus   . Chronic anticoagulation   . Chronic systolic CHF (congestive heart failure) (HCC)    a. EF previously 35-40%. b. improved to 55-65% by cath 08/2013.  [name] Colon cancer ([place])    a. 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 b.  "graduated" from their practice 06/2013, remains free of any new disease per those notes.  . Coronary artery disease    a. s/p PCI w/ DES to mLAD 08/05/10. b. NSTEMI 08/2013 (mildly elev trop)  - cath showing widely patent stent, 80% prox D1 (small vessel) with recommendation for medical management, residual 60% ostial PDA, <20% LCx, LVEF 55-65%.   . Depression   . Gallstone pancreatitis 2011   a. s/p chole.  [name] GERD (gastroesophageal reflux disease)   . Hyperlipidemia   . Hypertension   . Macrocytic anemia 06/25/2011  . Myelodysplastic disease ([place])    a. Suspected low-grade  myelodysplastic disease per onc notes.  . Other pancytopenia ([place]) 06/18/2014   Mild, fluctuating, likely med related. Rule out early MDS from prior chemo/RT or colon cancer  . PAF (paroxysmal atrial fibrillation) ([place])    a. failed TEE due to inability to pass probe into the esophagus in March 2013. b. On Coumadin. Was in NSR 08/2013 admission.  . Premature atrial contractions   . Premature ventricular contractions   . SBO (small bowel obstruction) ([place]) 2005  . Type II diabetes mellitus ([place])      Past Surgical History:  Procedure Laterality Date  . ABDOMINAL MASS RESECTION     Mass near mesentery  . APPENDECTOMY     "took out during colon surgery"  . CARDIAC CATHETERIZATION  08/03/10  . CARDIAC CATHETERIZATION  08/09/10   LAD 30, stent OK,  CFX 40, RCA < 20  . CARDIAC CATHETERIZATION N/A 05/20/2016   Procedure: Left Heart Cath and Coronary Angiography;  Surgeon: Lorretta Harp, MD;  Location: [facility];  Service: Cardiovascular;  Laterality: N/A;  . CATARACT EXTRACTION Bilateral   . CHOLECYSTECTOMY  2011  . COLON SURGERY    . CORONARY ANGIOPLASTY WITH STENT PLACEMENT  08/05/10   DES to the LAD  . CYSTOSCOPY     "with removal of kidney stone in office"  . CYSTOSCOPY WITH RETROGRADE PYELOGRAM, URETEROSCOPY AND STENT PLACEMENT Bilateral 05/18/2013   Procedure: CYSTOSCOPY WITH BILATERAL RETROGRADE PYELOGRAM, LEFT URETEROSCOPY AND LEFT STENT PLACEMENT;  Surgeon: Alexis Frock, MD;  Location: [facility];  Service: Urology;  Laterality: Bilateral;  . LEFT HEART CATHETERIZATION WITH CORONARY ANGIOGRAM  N/A 08/15/2013   Procedure: LEFT HEART CATHETERIZATION WITH CORONARY ANGIOGRAM;  Surgeon: Peter M Martinique, MD;  Location: [facility];  Service: Cardiovascular;  Laterality: N/A;  . RIGHT COLECTOMY    . TONSILLECTOMY      Allergies  Allergen Reactions  . Metformin And Related Nausea And Vomiting    I have reviewed the patient's current medications . atorvastatin  40 mg Oral q1800  . [START ON 09/01/2016] B-complex with vitamin C  1 tablet Oral Daily  . carvedilol  12.5 mg Oral BID WC  . cholecalciferol  1,000 Units Oral Daily  . donepezil  10 mg Oral QHS  . enoxaparin (LOVENOX) injection  40 mg Subcutaneous Q24H  . finasteride  5 mg Oral QHS  . [START ON 09/01/2016] glimepiride  1 mg Oral Daily  . insulin aspart  0-5 Units Subcutaneous QHS  . [START ON 09/01/2016] insulin aspart  0-9 Units Subcutaneous TID WC  . [START ON 09/01/2016] isosorbide mononitrate  120 mg Oral Daily  . [START ON 09/01/2016] linagliptin  5 mg Oral Daily  . [START ON 09/02/2016] lisinopril  2.5 mg Oral Daily  . [START ON 09/01/2016] pantoprazole  40 mg Oral Daily  . ranolazine  1,000 mg Oral BID  . warfarin  4 mg Oral ONCE-1800  . [START ON 09/01/2016] Warfarin - Pharmacist Dosing Inpatient   Does not apply q1800   . sodium chloride     acetaminophen, nitroGLYCERIN, ondansetron (ZOFRAN) IV  Medication Sig  atorvastatin (LIPITOR) 40 MG tablet TAKE ONE TABLET BY MOUTH ONCE DAILY AT  6  PM Patient taking differently: Take 40 mg by mouth once a day at 6 PM  B Complex-C (B-COMPLEX WITH VITAMIN C) tablet Take 1 tablet by mouth daily.  carvedilol (COREG) 25 MG tablet Take 0.5 tablets (12.5 mg total) by mouth 2 (two) times daily with a meal.  Cholecalciferol (VITAMIN D-3 PO) Take 1 tablet by mouth daily.   finasteride (PROSCAR) 5 MG tablet Take 5 mg by mouth at bedtime.   glimepiride (AMARYL) 1 MG tablet Take 1 mg by mouth daily.   isosorbide mononitrate (IMDUR) 120 MG 24 hr tablet Take 1 tablet (120 mg total) by mouth  daily.  lisinopril (PRINIVIL,ZESTRIL) 5 MG tablet Take 1 tablet (5 mg total) by mouth daily.  mupirocin ointment (BACTROBAN) 2 % Apply 1 application topically daily as needed (to affected area on forehead).   nitroGLYCERIN (NITROSTAT) 0.4 MG SL tablet PLACE ONE TABLET UNDER THE TONGUE EVERY 5 MINUTES AS NEEDED FOR CHEST PAIN.  pantoprazole (PROTONIX) 40 MG tablet Take 1 tablet (40 mg total) by mouth daily. Patient taking differently: Take 40 mg by mouth at bedtime.   Propylene Glycol (SYSTANE BALANCE)  0.6 % SOLN Apply 1-2 drops to eye 3 (three) times daily as needed (for dry eyes).  RANEXA 1000 MG SR tablet TAKE ONE TABLET BY MOUTH TWICE DAILY  warfarin (COUMADIN) 4 MG tablet Take 0.5-1 tablets (2-4 mg total) by mouth daily. Take 4mg  for the next 3 days (until 05/24/16), then resume regular dosing.   Take 2 mg on Tues / Thurs ** Take 4 mg on Sun / Mon / Wed / Fri / Sat Patient taking differently: Take 2-4 mg by mouth See admin instructions. 4 mg in the morning on Sun/Mon/Wed/Fri/Sat and 2 mg on Tues/Thurs  donepezil (ARICEPT) 10 MG tablet Take 10 mg by mouth at bedtime.   linagliptin (TRADJENTA) 5 MG TABS tablet Take 1 tablet (5 mg total) by mouth daily. For blood sugar Patient not taking: Reported on 08/31/2016     Social History   Social History  . Marital status: Single    Spouse name: N/A  . Number of children: 0  . Years of education: N/A   Occupational History  . Retired    Social History Main Topics  . Smoking status: Former Smoker    Packs/day: 1.00    Years: 30.00    Types: Cigarettes    Quit date: 05/10/1994  . Smokeless tobacco: Never Used  . Alcohol use 1.2 oz/week    2 Shots of liquor per week     Comment: occasionally.  . Drug use: No  . Sexual activity: No   Other Topics Concern  . Not on file   Social History Narrative   Lives alone.      Family Status  Relation Status  . Mother Deceased  . Father Deceased  . Paternal Grandfather Deceased  . Paternal  Grandmother Deceased  . Maternal Grandfather Deceased  . Maternal Grandmother Deceased  . Brother    Family History  Problem Relation Age of Onset  . Cancer Mother   . Ulcers Father 41  . Heart attack Brother 70     ROS:  Full 14 point review of systems complete and found to be negative unless listed above.  Physical Exam: Blood pressure (!) 145/75, pulse 77, temperature 97.9 F (36.6 C), temperature source Oral, resp. rate 16, height 5\' 9"  (1.753 m), weight 193 lb 11.2 oz (87.9 kg), SpO2 96 %.  General: Well developed, well nourished, male in no acute distress Head: Eyes PERRLA, No xanthomas.   Normocephalic and atraumatic, oropharynx without edema or exudate. Dentition: fair Lungs: few rales bases Heart: Heart irregular rate and rhythm with S1, S2, 2/6 murmur. pulses are 2+ all 4 extrem.  Neck: No carotid bruits. No lymphadenopathy.  JVD not elevated Abdomen: Bowel sounds present, abdomen soft and non-tender without masses or hernias noted. Msk:  No spine or cva tenderness. No weakness, no joint deformities or effusions. Extremities: No clubbing or cyanosis. No edema.  Neuro: Alert and oriented X 3. No focal deficits noted. Psych:  Good affect, responds appropriately Skin: No rashes or lesions noted.  Labs:   Lab Results  Component Value Date   WBC 6.3 08/31/2016   HGB 11.6 (L) 08/31/2016   HCT 34.8 (L) 08/31/2016   MCV 104.2 (H) 08/31/2016   PLT 170 08/31/2016    Recent Labs  08/31/16 1706  INR 1.57     Recent Labs Lab 08/31/16 1453  NA 135  K 4.1  CL 106  CO2 23  BUN 16  CREATININE 1.29*  CALCIUM 8.6*  PROT 6.2*  BILITOT 0.8  ALKPHOS 44  ALT 14*  AST 25  GLUCOSE 206*  ALBUMIN 3.0*    Recent Labs  08/31/16 1459  TROPIPOC 0.01   Echo: 11/14/2015 - Left ventricle: The cavity size was normal. Systolic function was   normal. Wall motion was normal; there were no regional wall   motion abnormalities. Doppler parameters are consistent with    abnormal left ventricular relaxation (grade 1 diastolic   dysfunction). - Aortic valve: There was mild stenosis. - Right atrium: ? RA band cor triatriatum not clinically significant - Atrial septum: No defect or patent foramen ovale was identified.  ECG:  08/30/2016 SR, RBBB is old  (sinus arrhythmia and PVCs seen on telemetry)  Cath: 05/20/2016  The left ventricular systolic function is normal.  LV end diastolic pressure is normal.  Prox LAD to Mid LAD lesion, 0 %stenosed.  2nd Diag lesion, 80 %stenosed.  Ost RCA lesion, 50 %stenosed.  Med rx  Radiology:  Dg Chest 2 View Result Date: 08/31/2016 CLINICAL DATA:  Left-sided chest pain for 2 weeks with shortness of Breath EXAM: CHEST  2 VIEW COMPARISON:  08/10/2016 FINDINGS: Cardiac shadow is stable. The lungs again demonstrate bilateral fibrotic changes similar to that seen on the prior exam. The previously seen changes along the minor fissure in the upper lobe are less prominent on the current exam. Degenerative change of the thoracic spine is noted. IMPRESSION: Chronic changes bilaterally. Nearly completely resolved changes in the right upper lobe when compare with the prior exam. Electronically Signed   By: Inez Catalina M.D.   On: 08/31/2016 15:31    ASSESSMENT AND PLAN:   The patient was seen today by Dr Meda Coffee, the patient evaluated and the data reviewed.   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 - he is on high doses of Imdur and Ranexa - continue to cycle ez - if ez remain negative, may need cath since his sx have progressed so much    Chronic systolic CHF (congestive heart failure) ([place]) - EF had improved at last echo    Otherwise, per IM   Non-insulin dependent type 2 diabetes mellitus (HCC)   Generalized weakness   Anemia, unspecified  Signed: Lenoard Aden 08/31/2016 7:19 PM Beeper [phone] Co-Sign MD  The patient was seen, examined and discussed with Rosaria Ferries, PA-C and I agree with the above.   83 y.o. male with hx of mild-mod AS, S-CHF w/ EF improved from 35%-55% by 2015, DES LAD 2012, NSTEMI 2015 w/ med rx for distal D1 80% and 60% PDA, a cath in January 2018 for unstable angina with 80% D2 and 60% RCA. He has been on Imdur 120 mg po daily, Ranexa 1000 mg po BID, carvedilol 12.5 mg po BID. He presented today with worsening chest pain - now daily with activity requiring NTG, resolves within 10-15 minutes. His ECG shows SR, RBBB that is chronic, telemetry shows new frequent PVCs. The first troponin is negative.  We will plan for a stress test in the morning to evaluate for ischemia. We will add amlodipine 2.5 mg po daily to his regimen. He has been on warfarin, INR 1.5, we will  start Heparin drip in case he needs a cardiac cath.  Ena Dawley, MD 08/31/2016

## 2016-08-31 NOTE — Progress Notes (Addendum)
ANTICOAGULATION CONSULT NOTE - Initial Consult  Pharmacy Consult for Coumadin Indication: atrial fibrillation  Allergies  Allergen Reactions  . Metformin And Related Nausea And Vomiting   Patient Measurements: Height: 5\' 9"  (175.3 cm) Weight: 193 lb 11.2 oz (87.9 kg) IBW/kg (Calculated) : 70.7  Vital Signs: Temp: 97.9 F (36.6 C) (04/24 1848) Temp Source: Oral (04/24 1848) BP: 145/75 (04/24 1848) Pulse Rate: 77 (04/24 1848)  Assessment: 83 yo M presents on 4/24 with chest pain. On Coumadin 4mg  daily exc for 2mg  on Tues/Thurs PTA for Afib. INR on admit low at 1.57. Hgb 11.6, plts wnl.  Goal of Therapy:  INR 2-3 Monitor platelets by anticoagulation protocol: Yes   Plan:  Give Coumadin 4mg  PO x 1 tonight Monitor daily INR, CBC, s/s of bleed  ADDENDUM: Cards to add heparin and bridge with INR < 2.  Plan: Start heparin gtt at 1,100 units/hr Check 8 hr heparin level tomorrow Monitor daily heparin level, CBC, s/s of bleed   Elenor Quinones, PharmD, [facility] Clinical Pharmacist Pager [phone] 08/31/2016 6:58 PM

## 2016-08-31 NOTE — ED Triage Notes (Signed)
Pt reports chest pain for 2 days. Yesterday relieved with nitro. Today he took 5 nitros and no chest pain but "feels wobbly". BP 100/60 after 250cc saline. Patient A&Ox3. Significant hx.

## 2016-08-31 NOTE — ED Notes (Signed)
Report attempted 

## 2016-08-31 NOTE — H&P (Signed)
TRH H&P   Patient Demographics:    [name], is a 83 y.o. male  MRN: [id number]   DOB - May 18, 1923  Admit Date - 08/31/2016  Outpatient Primary MD for the patient is Irven Shelling, MD  Outpatient Specialists: Dr [name]     Patient coming from: Home  Chief Complaint  Patient presents with  . Chest Pain      HPI:    [name]  is a 83 y.o. male, History of CAD status post multiple stents last drug-eluting stent in LAD in 2012, known history of LAD and RCA disease with 80% stenosis in LAD, DM type II, paroxysmal atrial fibrillation on Coumadin, hypertension, dyslipidemia, history of Barrett's esophagus and colon cancer, history of GI bleed in the past who has been admitted recently for chest pain workup and is under the care of Dr. [name] comes back with an episode of left-sided chest pressure which she says usually happens on every 24-48 hours but goes away when he takes 1-2 nitroglycerin tablets, today he experienced left-sided chest pressure after waking up, not associated with any other symptoms, no aggravating or relieving factors, he took 3 nitroglycerin pills without any relief so he called 911.  EMS gave him 5 baby aspirins along with some more nitroglycerin, he was then brought to the ER where his chest pressure gradually eased off to the point that he is now almost completely symptom-free, EKG and initial lab work including troponin were negative, cardiology was consulted and cardiologist requested hospitalist to admit the patient. Patient is currently symptom free. Denies any headache, no cough phlegm shortness of breath, no abdominal pain or discomfort, no recent dysuria, no blood in stool or  urine or focal weakness.    Review of systems:    In addition to the HPI above,   No Fever-chills, No Headache, No changes with Vision or hearing, No problems swallowing food or Liquids, No Chest pain Or pressure at this time, Cough or Shortness of Breath, No Abdominal pain, No Nausea or Vommitting, Bowel movements are regular, No Blood in stool or Urine, No dysuria, No new skin rashes or bruises, No new joints pains-aches,  No new weakness, tingling, numbness in any extremity, No recent weight gain or loss, No polyuria, polydypsia or polyphagia, No significant Mental Stressors.  A full 10 point Review of  Systems was done, except as stated above, all other Review of Systems were negative.   With Past History of the following :    Past Medical History:  Diagnosis Date  . Aortic stenosis    a. Mild-mod by echo 2013.  . Barrett esophagus   . Chronic anticoagulation   . Chronic systolic CHF (congestive heart failure) (HCC)    a. EF previously 35-40%. b. improved to 55-65% by cath 08/2013.  [name] Colon cancer ([place])    a. 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 b.  "graduated" from their practice 06/2013, remains free of any new disease per those notes.  . Coronary artery disease    a. s/p PCI w/ DES to mLAD 08/05/10. b. NSTEMI 08/2013 (mildly elev trop) - cath showing widely patent stent, 80% prox D1 (small vessel) with recommendation for medical management, residual 60% ostial PDA, <20% LCx, LVEF 55-65%.   . Depression   . Gallstone pancreatitis 2011   a. s/p chole.  [name] GERD (gastroesophageal reflux disease)   . Hyperlipidemia   . Hypertension   . Macrocytic anemia 06/25/2011  . Myelodysplastic disease ([place])    a. Suspected low-grade  myelodysplastic disease per onc notes.  . Other pancytopenia ([place]) 06/18/2014   Mild, fluctuating, likely med related. Rule out early MDS from prior chemo/RT or colon cancer  . PAF (paroxysmal atrial fibrillation) ([place])    a. failed TEE due  to inability to pass probe into the esophagus in March 2013. b. On Coumadin. Was in NSR 08/2013 admission.  . Premature atrial contractions   . Premature ventricular contractions   . SBO (small bowel obstruction) ([place]) 2005  . Type II diabetes mellitus ([place])       Past Surgical History:  Procedure Laterality Date  . ABDOMINAL MASS RESECTION     Mass near mesentery  . APPENDECTOMY     "took out during colon surgery"  . CARDIAC CATHETERIZATION  08/03/10  . CARDIAC CATHETERIZATION  08/09/10   LAD 30, stent OK, CFX 40, RCA < 20  . CARDIAC CATHETERIZATION N/A 05/20/2016   Procedure: Left Heart Cath and Coronary Angiography;  Surgeon: Lorretta Harp, MD;  Location: [facility];  Service: Cardiovascular;  Laterality: N/A;  . CATARACT EXTRACTION Bilateral   . CHOLECYSTECTOMY  2011  . COLON SURGERY    . CORONARY ANGIOPLASTY WITH STENT PLACEMENT  08/05/10   DES to the LAD  . CYSTOSCOPY     "with removal of kidney stone in office"  . CYSTOSCOPY WITH RETROGRADE PYELOGRAM, URETEROSCOPY AND STENT PLACEMENT Bilateral 05/18/2013   Procedure: CYSTOSCOPY WITH BILATERAL RETROGRADE PYELOGRAM, LEFT URETEROSCOPY AND LEFT STENT PLACEMENT;  Surgeon: Alexis Frock, MD;  Location: [facility];  Service: Urology;  Laterality: Bilateral;  . LEFT HEART CATHETERIZATION WITH CORONARY ANGIOGRAM N/A 08/15/2013   Procedure: LEFT HEART CATHETERIZATION WITH CORONARY ANGIOGRAM;  Surgeon: Peter M Martinique, MD;  Location: [facility];  Service: Cardiovascular;  Laterality: N/A;  . RIGHT COLECTOMY    . TONSILLECTOMY        Social History:     Social History  Substance Use Topics  . Smoking status: Former Smoker    Packs/day: 1.00    Years: 30.00    Types: Cigarettes    Quit date: 05/10/1994  . Smokeless tobacco: Never Used  . Alcohol use 1.2 oz/week    2 Shots of liquor per week     Comment: occasionally.         Family History :  Family History  Problem Relation Age of Onset  . Cancer Mother   . Ulcers  Father 17  . Heart attack Brother 70       Home Medications:   Prior to Admission medications   Medication Sig Start Date End Date Taking? Authorizing Provider  atorvastatin (LIPITOR) 40 MG tablet TAKE ONE TABLET BY MOUTH ONCE DAILY AT  6  PM Patient taking differently: TAKE ONE TABLET BY MOUTH ONCE DAILY 01/16/16   Thayer Headings, MD  B Complex-C (B-COMPLEX WITH VITAMIN C) tablet Take 1 tablet by mouth daily.    Historical Provider, MD  carvedilol (COREG) 25 MG tablet Take 0.5 tablets (12.5 mg total) by mouth 2 (two) times daily with a meal. 07/15/16 07/15/17  Thayer Headings, MD  Cholecalciferol (VITAMIN D-3 PO) Take 1 tablet by mouth daily.     Historical Provider, MD  donepezil (ARICEPT) 10 MG tablet Take 10 mg by mouth at bedtime.  05/04/16   Historical Provider, MD  finasteride (PROSCAR) 5 MG tablet Take 5 mg by mouth at bedtime.  04/01/14   Historical Provider, MD  glimepiride (AMARYL) 1 MG tablet Take 1 mg by mouth daily.  05/29/16   Historical Provider, MD  isosorbide mononitrate (IMDUR) 120 MG 24 hr tablet Take 1 tablet (120 mg total) by mouth daily. 07/20/16   Reyne Dumas, MD  linagliptin (TRADJENTA) 5 MG TABS tablet Take 1 tablet (5 mg total) by mouth daily. For blood sugar 11/04/15   Clanford Marisa Hua, MD  lisinopril (PRINIVIL,ZESTRIL) 5 MG tablet Take 1 tablet (5 mg total) by mouth daily. 05/22/16   Cheryln Manly, NP  nitroGLYCERIN (NITROSTAT) 0.4 MG SL tablet PLACE ONE TABLET UNDER THE TONGUE EVERY 5 MINUTES AS NEEDED FOR CHEST PAIN. 08/16/16   Thayer Headings, MD  OVER THE COUNTER MEDICATION Place 1 drop into both eyes 2 (two) times daily as needed (dry eyes).    Historical Provider, MD  pantoprazole (PROTONIX) 40 MG tablet Take 1 tablet (40 mg total) by mouth daily. 07/19/16   Reyne Dumas, MD  RANEXA 1000 MG SR tablet TAKE ONE TABLET BY MOUTH TWICE DAILY 08/20/16   Thayer Headings, MD  ranolazine (RANEXA) 1000 MG SR tablet Take 1,000 mg by mouth 2 (two) times daily.     Historical  Provider, MD  warfarin (COUMADIN) 4 MG tablet Take 0.5-1 tablets (2-4 mg total) by mouth daily. Take 4mg  for the next 3 days (until 05/24/16), then resume regular dosing.   Take 2 mg on Tues / Thurs ** Take 4 mg on Sun / Mon / Wed / Fri / Sat Patient taking differently: Take 4 mg by mouth daily after breakfast.  05/21/16   Cheryln Manly, NP     Allergies:     Allergies  Allergen Reactions  . Metformin And Related Nausea And Vomiting     Physical Exam:   Vitals  Blood pressure 118/75, pulse 64, temperature 98 F (36.7 C), temperature source Oral, resp. rate 18, SpO2 97 %.   1. General Elderly white male lying in bed in NAD,     2. Normal affect and insight, Not Suicidal or Homicidal, Awake Alert, Oriented X 3.  3. No F.N deficits, ALL C.Nerves Intact, Strength 5/5 all 4 extremities, Sensation intact all 4 extremities, Plantars down going.  4. Ears and Eyes appear Normal, Conjunctivae clear, PERRLA. Moist Oral Mucosa.  5. Supple Neck, No JVD, No cervical lymphadenopathy appriciated, No Carotid Bruits.  6. Symmetrical  Chest wall movement, Good air movement bilaterally, CTAB.  7. RRR, No Gallops, Rubs or Murmurs, No Parasternal Heave.  8. Positive Bowel Sounds, Abdomen Soft, No tenderness, No organomegaly appriciated,No rebound -guarding or rigidity.  9.  No Cyanosis, Normal Skin Turgor, No Skin Rash or Bruise.  10. Good muscle tone,  joints appear normal , no effusions, Normal ROM.  11. No Palpable Lymph Nodes in Neck or Axillae      Data Review:    CBC  Recent Labs Lab 08/31/16 1453  WBC 6.3  HGB 11.6*  HCT 34.8*  PLT 170  MCV 104.2*  MCH 34.7*  MCHC 33.3  RDW 13.2  LYMPHSABS 0.6*  MONOABS 0.6  EOSABS 0.1  BASOSABS 0.0   ------------------------------------------------------------------------------------------------------------------  Chemistries   Recent Labs Lab 08/31/16 1453  NA 135  K 4.1  CL 106  CO2 23  GLUCOSE 206*  BUN 16    CREATININE 1.29*  CALCIUM 8.6*  AST 25  ALT 14*  ALKPHOS 44  BILITOT 0.8   ------------------------------------------------------------------------------------------------------------------ CrCl cannot be calculated (Unknown ideal weight.). ------------------------------------------------------------------------------------------------------------------ No results for input(s): TSH, T4TOTAL, T3FREE, THYROIDAB in the last 72 hours.  Invalid input(s): FREET3  Coagulation profile No results for input(s): INR, PROTIME in the last 168 hours. ------------------------------------------------------------------------------------------------------------------- No results for input(s): DDIMER in the last 72 hours. -------------------------------------------------------------------------------------------------------------------  Cardiac Enzymes No results for input(s): CKMB, TROPONINI, MYOGLOBIN in the last 168 hours.  Invalid input(s): CK ------------------------------------------------------------------------------------------------------------------    Component Value Date/Time   BNP 223.9 (H) 08/10/2015 0158   BNP 48.1 07/28/2010 1745   Lab Results  Component Value Date   INR 1.57 08/31/2016   INR 2.79 07/19/2016   INR 4.58 (HH) 07/18/2016     ---------------------------------------------------------------------------------------------------------------  Urinalysis    Component Value Date/Time   COLORURINE YELLOW 07/18/2016 0105   APPEARANCEUR CLEAR 07/18/2016 0105   LABSPEC 1.018 07/18/2016 0105   PHURINE 5.0 07/18/2016 0105   GLUCOSEU NEGATIVE 07/18/2016 0105   HGBUR NEGATIVE 07/18/2016 0105   BILIRUBINUR NEGATIVE 07/18/2016 0105   KETONESUR NEGATIVE 07/18/2016 0105   PROTEINUR 30 (A) 07/18/2016 0105   UROBILINOGEN 0.2 06/07/2014 1924   NITRITE NEGATIVE 07/18/2016 0105   LEUKOCYTESUR NEGATIVE 07/18/2016 0105     ----------------------------------------------------------------------------------------------------------------   Imaging Results:    Dg Chest 2 View  Result Date: 08/31/2016 CLINICAL DATA:  Left-sided chest pain for 2 weeks with shortness of Breath EXAM: CHEST  2 VIEW COMPARISON:  08/10/2016 FINDINGS: Cardiac shadow is stable. The lungs again demonstrate bilateral fibrotic changes similar to that seen on the prior exam. The previously seen changes along the minor fissure in the upper lobe are less prominent on the current exam. Degenerative change of the thoracic spine is noted. IMPRESSION: Chronic changes bilaterally. Nearly completely resolved changes in the right upper lobe when compare with the prior exam. Electronically Signed   By: Inez Catalina M.D.   On: 08/31/2016 15:31    My personal review of EKG: Rhythm NSR, RBBB , no Acute ST changes   Assessment & Plan:     1. Chest pain in a patient with known CAD. Does have known 80% LAD disease, Currently on medical management . He is currently symptom free and EKG is nonacute, we will continue him on Coreg, statin, Imdur, Ranexa, already on Coumadin continue, has received 5 baby aspirin today, as needed sublingual nitroglycerin, cycle troponin and consult cardiology. His medical management seems to have maximized, he should either be comfort care hospice or may be a coronary  intervention can be planned if he remains frequently symptomatic.  2. ARF. Minimal, hold ACE inhibitor for a day, hydrate for 12 hours and monitor.  3. Hypertension for now continue on present dose Coreg and Imdur, holding ACE inhibitor due to ARF.  4. Dyslipidemia. Continue home dose statin unchanged.  5. BPH. On Proscar continue.  6. Mild cognitive dysfunction. On Aricept which will be continued.  7. DM type II. Check A1c, continue home medications which include Amaryl and Trajenta, add sliding scale.   8. Paroxysmal atrial fibrillation. [place] vasc 2 score of  at least 3. Continue beta blocker, pharmacy to monitor Coumadin dose.  9. History of Barrett's esophagus, colon cancer and GI bleed. Currently on Coumadin and PPI which will be continued, note he is not on antiplatelets on a regular basis due to his past GI history.    DVT Prophylaxis Lovenox    AM Labs Ordered, also please review Full Orders  Family Communication: Admission, patients condition and plan of care including tests being ordered have been discussed with the patient  who indicates understanding and agree with the plan and Code Status.  Code Status Full  Likely DC to Home 1-2 days    Condition GUARDED    Consults called: Cards    Admission status: Obs    Time spent in minutes : 35   Lala Lund M.D on 08/31/2016 at 5:43 PM  Between 7am to 7pm - Pager - [phone] ( page via [facility], text pages only, please mention full 10 digit call back number).  After 7pm go to [email or web address] - password [facility]  [facility] - Office  [phone]

## 2016-08-31 NOTE — ED Notes (Signed)
Pt has urinal at bedside 

## 2016-08-31 NOTE — ED Notes (Signed)
Patient transported to X-ray 

## 2016-08-31 NOTE — Progress Notes (Signed)
Patient arrived to 2W room 17.  Telemetry monitor applied and CCMD notified.  Patient oriented to unit and room to include call light and phone.  Will continue to monitor.

## 2016-09-01 ENCOUNTER — Observation Stay (HOSPITAL_BASED_OUTPATIENT_CLINIC_OR_DEPARTMENT_OTHER): Payer: Medicare Other

## 2016-09-01 ENCOUNTER — Encounter (HOSPITAL_COMMUNITY): Payer: Self-pay | Admitting: General Practice

## 2016-09-01 DIAGNOSIS — C189 Malignant neoplasm of colon, unspecified: Secondary | ICD-10-CM | POA: Diagnosis not present

## 2016-09-01 DIAGNOSIS — I480 Paroxysmal atrial fibrillation: Secondary | ICD-10-CM | POA: Diagnosis not present

## 2016-09-01 DIAGNOSIS — I10 Essential (primary) hypertension: Secondary | ICD-10-CM

## 2016-09-01 DIAGNOSIS — I5022 Chronic systolic (congestive) heart failure: Secondary | ICD-10-CM

## 2016-09-01 DIAGNOSIS — I200 Unstable angina: Secondary | ICD-10-CM | POA: Diagnosis not present

## 2016-09-01 DIAGNOSIS — R079 Chest pain, unspecified: Secondary | ICD-10-CM | POA: Diagnosis not present

## 2016-09-01 DIAGNOSIS — C772 Secondary and unspecified malignant neoplasm of intra-abdominal lymph nodes: Secondary | ICD-10-CM | POA: Diagnosis not present

## 2016-09-01 LAB — NM MYOCAR MULTI W/SPECT W/WALL MOTION / EF
CHL CUP MPHR: 129 {beats}/min
CHL CUP NUCLEAR SDS: 1
CHL CUP NUCLEAR SSS: 6
CHL CUP STRESS STAGE 1 DBP: 98 mmHg
CHL CUP STRESS STAGE 1 GRADE: 0 %
CHL CUP STRESS STAGE 1 SBP: 128 mmHg
CHL CUP STRESS STAGE 2 HR: 75 {beats}/min
CHL CUP STRESS STAGE 3 HR: 89 {beats}/min
CHL CUP STRESS STAGE 3 SPEED: 0 mph
CHL CUP STRESS STAGE 4 SBP: 159 mmHg
CHL CUP STRESS STAGE 4 SPEED: 0 mph
Estimated workload: 1 METS
LV sys vol: 61 mL
LVDIAVOL: 117 mL (ref 62–150)
NUC STRESS TID: 1.02
Peak BP: 159 mmHg
Peak HR: 86 {beats}/min
Percent HR: 71 %
Percent of predicted max HR: 66 %
RATE: 0.42
Rest HR: 67 {beats}/min
SRS: 5
Stage 1 HR: 75 {beats}/min
Stage 1 Speed: 0 mph
Stage 2 Grade: 0 %
Stage 2 Speed: 0 mph
Stage 3 DBP: 85 mmHg
Stage 3 Grade: 0 %
Stage 3 SBP: 150 mmHg
Stage 4 DBP: 84 mmHg
Stage 4 Grade: 0 %
Stage 4 HR: 86 {beats}/min

## 2016-09-01 LAB — BASIC METABOLIC PANEL
Anion gap: 6 (ref 5–15)
BUN: 15 mg/dL (ref 6–20)
CO2: 25 mmol/L (ref 22–32)
CREATININE: 1.02 mg/dL (ref 0.61–1.24)
Calcium: 8.6 mg/dL — ABNORMAL LOW (ref 8.9–10.3)
Chloride: 105 mmol/L (ref 101–111)
GFR calc Af Amer: >60 mL/min (ref >60)
GLUCOSE: 180 mg/dL — AB (ref 65–99)
POTASSIUM: 4.5 mmol/L (ref 3.5–5.1)
SODIUM: 136 mmol/L (ref 135–145)

## 2016-09-01 LAB — CBC
HEMATOCRIT: 32.7 % — AB (ref 39.0–52.0)
HEMOGLOBIN: 11 g/dL — AB (ref 13.0–17.0)
MCH: 34.6 pg — ABNORMAL HIGH (ref 26.0–34.0)
MCHC: 33.6 g/dL (ref 30.0–36.0)
MCV: 102.8 fL — ABNORMAL HIGH (ref 78.0–100.0)
Platelets: 163 10*3/uL (ref 150–400)
RBC: 3.18 MIL/uL — AB (ref 4.22–5.81)
RDW: 13.2 % (ref 11.5–15.5)
WBC: 5.7 10*3/uL (ref 4.0–10.5)

## 2016-09-01 LAB — TROPONIN I
TROPONIN I: 0.04 ng/mL — AB (ref <0.03)
TROPONIN I: 0.03 ng/mL — AB (ref <0.03)

## 2016-09-01 LAB — GLUCOSE, CAPILLARY
GLUCOSE-CAPILLARY: 103 mg/dL — AB (ref 65–99)
GLUCOSE-CAPILLARY: 203 mg/dL — AB (ref 65–99)
GLUCOSE-CAPILLARY: 129 mg/dL — AB (ref 65–99)

## 2016-09-01 LAB — PROTIME-INR
INR: 1.57
Prothrombin Time: 18.9 seconds — ABNORMAL HIGH (ref 11.4–15.2)

## 2016-09-01 LAB — HEMOGLOBIN A1C
HEMOGLOBIN A1C: 6.3 % — AB (ref 4.8–5.6)
Mean Plasma Glucose: 134 mg/dL

## 2016-09-01 LAB — MAGNESIUM: Magnesium: 1.9 mg/dL (ref 1.7–2.4)

## 2016-09-01 LAB — HEPARIN LEVEL (UNFRACTIONATED)
HEPARIN UNFRACTIONATED: 1.02 [IU]/mL — AB (ref 0.30–0.70)
HEPARIN UNFRACTIONATED: 0.57 [IU]/mL (ref 0.30–0.70)
HEPARIN UNFRACTIONATED: 0.29 [IU]/mL — AB (ref 0.30–0.70)

## 2016-09-01 MED ORDER — WARFARIN SODIUM 4 MG PO TABS
4.0000 mg | ORAL_TABLET | Freq: Once | ORAL | Status: AC
  Administered 2016-09-01: 4 mg via ORAL
  Filled 2016-09-01: qty 1

## 2016-09-01 MED ORDER — AMINOPHYLLINE 25 MG/ML IV SOLN
INTRAVENOUS | Status: AC
  Filled 2016-09-01: qty 10

## 2016-09-01 MED ORDER — AMLODIPINE BESYLATE 5 MG PO TABS
2.5000 mg | ORAL_TABLET | Freq: Every day | ORAL | Status: DC
  Administered 2016-09-01 – 2016-09-02 (×2): 2.5 mg via ORAL
  Filled 2016-09-01 (×2): qty 1

## 2016-09-01 MED ORDER — TECHNETIUM TC 99M TETROFOSMIN IV KIT
10.0000 | PACK | Freq: Once | INTRAVENOUS | Status: AC | PRN
  Administered 2016-09-01: 10 via INTRAVENOUS

## 2016-09-01 MED ORDER — TECHNETIUM TC 99M TETROFOSMIN IV KIT
30.0000 | PACK | Freq: Once | INTRAVENOUS | Status: AC | PRN
  Administered 2016-09-01: 30 via INTRAVENOUS

## 2016-09-01 MED ORDER — REGADENOSON 0.4 MG/5ML IV SOLN
0.4000 mg | Freq: Once | INTRAVENOUS | Status: AC
  Administered 2016-09-01: 0.4 mg via INTRAVENOUS
  Filled 2016-09-01: qty 5

## 2016-09-01 MED ORDER — REGADENOSON 0.4 MG/5ML IV SOLN
INTRAVENOUS | Status: AC
  Filled 2016-09-01: qty 5

## 2016-09-01 MED ORDER — HEPARIN (PORCINE) IN NACL 100-0.45 UNIT/ML-% IJ SOLN
900.0000 [IU]/h | INTRAMUSCULAR | Status: DC
  Administered 2016-09-01: 900 [IU]/h via INTRAVENOUS
  Filled 2016-09-01: qty 250

## 2016-09-01 NOTE — Progress Notes (Signed)
Troponin of 0.04 and pause of 2.03 seconds while patient slept were paged to the MD on call. No new orders received at this time time. Will continue to montior.

## 2016-09-01 NOTE — Progress Notes (Signed)
Progress Note  Patient Name: [name] Date of Encounter: 09/01/2016  Primary Cardiologist:  Dr. [name]  Subjective   Pt seen in Nuclear Medicine for Deepstep. Has not had anymore CP over night. Denies SOB. Did report chest tightness during stress test with no significant EKG changes.  Inpatient Medications    Scheduled Meds: . aminophylline      . atorvastatin  40 mg Oral q1800  . B-complex with vitamin C  1 tablet Oral Daily  . carvedilol  12.5 mg Oral BID WC  . cholecalciferol  1,000 Units Oral Daily  . donepezil  10 mg Oral QHS  . finasteride  5 mg Oral QHS  . glimepiride  1 mg Oral Daily  . insulin aspart  0-5 Units Subcutaneous QHS  . insulin aspart  0-9 Units Subcutaneous TID WC  . isosorbide mononitrate  120 mg Oral Daily  . linagliptin  5 mg Oral Daily  . [START ON 09/02/2016] lisinopril  2.5 mg Oral Daily  . pantoprazole  40 mg Oral Daily  . ranolazine  1,000 mg Oral BID  . regadenoson      . Warfarin - Pharmacist Dosing Inpatient   Does not apply q1800   Continuous Infusions: . heparin 1,200 Units/hr (09/01/16 0200)   PRN Meds: acetaminophen, nitroGLYCERIN, ondansetron (ZOFRAN) IV   Vital Signs    Vitals:   09/01/16 1124 09/01/16 1125 09/01/16 1127 09/01/16 1129  BP: (!) 162/95 (!) 150/85 (!) 159/84   Pulse: 79 90 87 86  Resp:      Temp:      TempSrc:      SpO2:      Weight:      Height:       No intake or output data in the 24 hours ending 09/01/16 1148 Filed Weights   08/31/16 1848  Weight: 193 lb 11.2 oz (87.9 kg)    Telemetry    Seen in Nuc Med  ECG    RBBB, occasional PVCs, SR, HR 80's- Personally Reviewed  Physical Exam   General: Well developed, well nourished, male in no acute distress Head: Eyes PERRLA, No xanthomas.   Normocephalic and atraumatic, oropharynx without edema or exudate. Dentition: fair Lungs: few rales bases Heart: Heart irregular rate and rhythm with S1, S2, 2/6 murmur. pulses are 2+ all 4 extrem.    Neck: No carotid bruits. No lymphadenopathy.  JVD not elevated Abdomen: Bowel sounds present, abdomen soft and non-tender without masses or hernias noted. Msk:  No spine or cva tenderness. No weakness, no joint deformities or effusions. Extremities: No clubbing or cyanosis. No edema.  Neuro: Alert and oriented X 3. No focal deficits noted. Psych:  Good affect, responds appropriately Skin: No rashes or lesions noted  Labs    Chemistry Recent Labs Lab 08/31/16 1453  NA 135  K 4.1  CL 106  CO2 23  GLUCOSE 206*  BUN 16  CREATININE 1.29*  CALCIUM 8.6*  PROT 6.2*  ALBUMIN 3.0*  AST 25  ALT 14*  ALKPHOS 44  BILITOT 0.8  GFRNONAA 47*  GFRAA 54*  ANIONGAP 6     Hematology Recent Labs Lab 08/31/16 1453 09/01/16 0057  WBC 6.3 5.7  RBC 3.34* 3.18*  HGB 11.6* 11.0*  HCT 34.8* 32.7*  MCV 104.2* 102.8*  MCH 34.7* 34.6*  MCHC 33.3 33.6  RDW 13.2 13.2  PLT 170 163    Cardiac Enzymes Recent Labs Lab 08/31/16 1945 09/01/16 0057  TROPONINI 0.04* 0.04*  Recent Labs Lab 08/31/16 1459  TROPIPOC 0.01      Radiology    Dg Chest 2 View  Result Date: 08/31/2016 CLINICAL DATA:  Left-sided chest pain for 2 weeks with shortness of Breath EXAM: CHEST  2 VIEW COMPARISON:  08/10/2016 FINDINGS: Cardiac shadow is stable. The lungs again demonstrate bilateral fibrotic changes similar to that seen on the prior exam. The previously seen changes along the minor fissure in the upper lobe are less prominent on the current exam. Degenerative change of the thoracic spine is noted. IMPRESSION: Chronic changes bilaterally. Nearly completely resolved changes in the right upper lobe when compare with the prior exam. Electronically Signed   By: Inez Catalina M.D.   On: 08/31/2016 15:31    Cardiac Studies   Left Heart Cath and Coronary Angiography 1/ 2018   The left ventricular systolic function is normal.  LV end diastolic pressure is normal.  Prox LAD to Mid LAD lesion, 0  %stenosed.  2nd Diag lesion, 80 %stenosed.  Ost RCA lesion, 50 %stenosed.   widely patent LAD stent with no significant CAD and unchanged anatomy compared to his last cath 3 years ago. He has chronic stable angina possibly related to a small diagonal branch stenosis.  Patient Profile     [name] is a 83 y.o. male with hx of mild-mod AS, S-CHF w/ EF improved from 35%-55% by 2015, DES LAD 2012, NSTEMI 2015 w/ med rx for distal D1 80% and 60^ PDA. He presented to the ER on 08/31/2016 for chest pain.  Assessment & Plan     Active Problems:   1. Paroxysmal atrial fibrillation (HCC) - maintaining SR with frequent PVCs which is new - continue Coreg, morning dose held for Nuc Stress test    2. Colon cancer metastasized to mesenteric lymph nodes (HCC) - per IM    3. Chest pain - initial ez negative x 4.  ECG w/ RBBB and no change - known distal D1 stenosis and PDA dz plus hx LAD stent with med rx recommended at cath 05/2016. - he is Imdur 120 mg po daily, Ranexa 1000 mg po BID, carvedilol 12.5 mg po BID. Amlodipine 2.5 mg PO daily added to this yesterday. -- Heparin drip going in case he needs cath - Nuclear Stress test done today to assess for ischemia  3. Chronic systolic CHF (congestive heart failure) (HCC) - EF had improved at last echo  (2017)  Otherwise, per IM   Non-insulin dependent type 2 diabetes mellitus (HCC)   Generalized weakness   Anemia, unspecified  Pending stress test results for further recommendations.  Kristopher Glee, PA-C  09/01/2016, 11:48 AM    The patient was seen, examined and discussed with Delos Haring, PA-C and I agree with the above.   83 y.o. male with hx of mild-mod AS, S-CHF w/ EF improved from 35%-55% by 2015, DES LAD 2012, NSTEMI 2015 w/ med rx for distal D1 80% and 60% PDA, a cath in January 2018 for unstable angina with 80% D2 and 60% RCA. He has been on Imdur 120 mg po daily, Ranexa 1000 mg po BID, carvedilol 12.5 mg  po BID. He presented today with worsening chest pain - now daily with activity requiring NTG, resolves within 10-15 minutes. His ECG shows SR, RBBB that is chronic, telemetry shows new frequent PVCs. The first troponin is negative.  He underwent a stress test in the morning to evaluate for ischemia. We are awaiting results, he is chest  pain free today. We added amlodipine 2.5 mg po daily to his regimen, BP is still elevated, we will increase to 5 mg po daily. He has been on warfarin, INR 1.5, we started Heparin drip in case he needs a cardiac cath. If negative stress test, he can be discharged home.  Ena Dawley, MD 09/01/2016

## 2016-09-01 NOTE — Progress Notes (Addendum)
ANTICOAGULATION CONSULT NOTE - Follow Up Consult  Pharmacy Consult for Heparin and Coumadin Indication: chest pain/ACS and atrial fibrillation  Allergies  Allergen Reactions  . Metformin And Related Nausea And Vomiting    Patient Measurements: Height: 5\' 9"  (175.3 cm) Weight: 193 lb 11.2 oz (87.9 kg) IBW/kg (Calculated) : 70.7 Heparin Dosing Weight: 88 kg  Vital Signs: Temp: 98.1 F (36.7 C) (04/25 0508) Temp Source: Oral (04/25 0508) BP: 117/61 (04/25 1316) Pulse Rate: 76 (04/25 1316)  Labs:  Recent Labs  08/31/16 1453 08/31/16 1706 08/31/16 1945 09/01/16 0057 09/01/16 1353  HGB 11.6*  --   --  11.0*  --   HCT 34.8*  --   --  32.7*  --   PLT 170  --   --  163  --   LABPROT  --  19.0*  --  18.9*  --   INR  --  1.57  --  1.57  --   HEPARINUNFRC  --   --   --  0.29* 1.02*  CREATININE 1.29*  --   --   --  1.02  TROPONINI  --   --  0.04* 0.04* 0.03*    Estimated Creatinine Clearance: 51.8 mL/min (by C-G formula based on SCr of 1.02 mg/dL).  Assessment:  83 yr old male on admitted 4/24 with chest pain.  On Coumadin prior to admission for afib.  INR 1.57 on admit, IV heparin added.  Coumadin held last night, in case cath needed.  INR 1.57 again today.   Initial heparin level was on just below target range (0.29) on 1100 units/hr.  Drip increased to 1200 units/hr -> level 1.02 this afternoon.  Heparin level drawn from opposite arm per patient.   Spoke with Burr Medico, PA-C. Stress test results pending. Rx to defer Coumadin dosing until results are available, in case cath needed.  Goal of Therapy:  INR 2-3 Heparin level 0.3-0.7 units/ml Monitor platelets by anticoagulation protocol: Yes   Plan:   Decrease heparin drip to 900 units/hr.  Hold off on Coumadin dosing until stress test results available.  If no cath, plan usual Coumadin dose of 4 mg tonight. Would give 4 mg again on 4/26 instead of usual Thursday dose of 2 mg.  Also discussed with patient.  Heparin level ~6  hrs after rate change if still here tonight.  Daily heparin level, PT/INT and CBC.  Arty Baumgartner, [place] Pager: [phone] 09/01/2016,3:32 PM   Addendum:  Stress test results noted.  For medical treatment.  Coumadin 4 mg ordered for tonight.  Consuello Masse, RPh 09/01/2016. 5:20 PM

## 2016-09-01 NOTE — Progress Notes (Signed)
ANTICOAGULATION CONSULT NOTE - Follow Up Consult  Pharmacy Consult for Heparin  Indication: chest pain/ACS  Allergies  Allergen Reactions  . Metformin And Related Nausea And Vomiting    Patient Measurements: Height: 5\' 9"  (175.3 cm) Weight: 193 lb 11.2 oz (87.9 kg) IBW/kg (Calculated) : 70.7  Vital Signs: Temp: 98.1 F (36.7 C) (04/24 2049) Temp Source: Oral (04/24 2049) BP: 122/72 (04/24 2049) Pulse Rate: 81 (04/24 2049)  Labs:  Recent Labs  08/31/16 1453 08/31/16 1706 08/31/16 1945 09/01/16 0057  HGB 11.6*  --   --  11.0*  HCT 34.8*  --   --  32.7*  PLT 170  --   --  163  LABPROT  --  19.0*  --  18.9*  INR  --  1.57  --  1.57  HEPARINUNFRC  --   --   --  0.29*  CREATININE 1.29*  --   --   --   TROPONINI  --   --  0.04*  --     Estimated Creatinine Clearance: 40.9 mL/min (A) (by C-G formula based on SCr of 1.29 mg/dL (H)).   Assessment: Heparin for chest pain, plans for stress test and cath if needed, heparin level just below therapeutic range, no issues per RN.   Goal of Therapy:  Heparin level 0.3-0.7 units/ml Monitor platelets by anticoagulation protocol: Yes   Plan:  -Inc heparin to 1200 units/hr -1100 HL  Airica Schwartzkopf 09/01/2016,1:36 AM

## 2016-09-01 NOTE — Discharge Instructions (Signed)

## 2016-09-01 NOTE — Progress Notes (Signed)
[facility] PROGRESS NOTE  [name] EHM:[id number] DOB: 1924-04-02 DOA: 08/31/2016   PCP: Irven Shelling, MD     Assessment/Plan: Active Problems:   Paroxysmal atrial fibrillation ([place])   Colon cancer metastasized to mesenteric lymph nodes (HCC)   Chest pain   Chronic systolic CHF (congestive heart failure) (HCC)   Non-insulin dependent type 2 diabetes mellitus (HCC)   Generalized weakness   Anemia, unspecified    83 y.o. male, History of CAD status post multiple stents last drug-eluting stent in LAD in 2012, known history of LAD and RCA disease with 80% stenosis in LAD, DM type II, paroxysmal atrial fibrillation on Coumadin, hypertension, dyslipidemia, history of Barrett's esophagus and colon cancer, history of GI bleed in the past who has been admitted recently for chest pain workup and is under the care of Dr. [name] comes back with an episode of left-sided chest pressure which she says usually happens on every 24-48 hours but goes away when he takes 1-2 nitroglycerin tablets,. Patient evaluated by cardiology/24. His ECG shows SR, RBBB that is chronic, telemetry shows new frequent PVCs. The first troponin is negative  Assessment and plan 1. Chest pain in a patient with known CAD. Does have known 80% LAD disease, Currently on medical management .ECG w/ RBBB and no change  ,  continue   Coreg, statin, Imdur, Ranexa, already on Coumadin continue, heparin drip, as needed sublingual nitroglycerin, cardiology recommending stress test  . Patient is currently NPO. Troponin flat    2. elevated creatinine. Creatinine 1.29 patient hydrated overnight, will repeat  3. Hypertension for now continue on present dose Coreg and Imdur, holding ACE inhibitor due to ARF.  4. Dyslipidemia. Continue home dose statin unchanged.  5. BPH. On Proscar continue.  6. Mild cognitive dysfunction. On Aricept which will be continued.  7. DM type II. Hemoglobin A1c 6.3, continue  home medications which include Amaryl and Trajenta, add sliding scale.   8. Paroxysmal atrial fibrillation. [place] vasc 2 score of at least 3. Continue beta blocker, pharmacy to monitor Coumadin dose. Heparin drip as INR subtherapeutic  9. History of Barrett's esophagus, colon cancer and GI bleed. Currently on Coumadin and PPI which will be continued, note he is not on antiplatelets on a regular basis due to his past GI history.     DVT prophylaxsis heparin/Coumadin  Code Status:  Full code     Family Communication: Discussed in detail with the patient, all imaging results, lab results explained to the patient   Disposition Plan:  As per cardiology      Consultants:  Cardiology  Procedures:  None  Antibiotics: Anti-infectives    None         HPI/Subjective:  Chest pain-free this morning  Objective: Vitals:   08/31/16 1830 08/31/16 1848 08/31/16 2049 09/01/16 0508  BP: 138/82 (!) 145/75 122/72 (!) 154/77  Pulse: 71 77 81 73  Resp: 16 16 18 18   Temp:  97.9 F (36.6 C) 98.1 F (36.7 C) 98.1 F (36.7 C)  TempSrc:  Oral Oral Oral  SpO2: 95% 96% 94% 95%  Weight:  87.9 kg (193 lb 11.2 oz)    Height:  5\' 9"  (1.753 m)     No intake or output data in the 24 hours ending 09/01/16 3662  Exam:  Examination:  General exam: Appears calm and comfortable  Respiratory system: Clear to auscultation. Respiratory effort normal. Cardiovascular system: S1 & S2 heard, RRR. No JVD, murmurs, rubs, gallops  or clicks. No pedal edema. Gastrointestinal system: Abdomen is nondistended, soft and nontender. No organomegaly or masses felt. Normal bowel sounds heard. Central nervous system: Alert and oriented. No focal neurological deficits. Extremities: Symmetric 5 x 5 power. Skin: No rashes, lesions or ulcers Psychiatry: Judgement and insight appear normal. Mood & affect appropriate.     Data Reviewed: I have personally reviewed following labs and imaging studies  Micro  Results No results found for this or any previous visit (from the past 240 hour(s)).  Radiology Reports Dg Chest 2 View  Result Date: 08/31/2016 CLINICAL DATA:  Left-sided chest pain for 2 weeks with shortness of Breath EXAM: CHEST  2 VIEW COMPARISON:  08/10/2016 FINDINGS: Cardiac shadow is stable. The lungs again demonstrate bilateral fibrotic changes similar to that seen on the prior exam. The previously seen changes along the minor fissure in the upper lobe are less prominent on the current exam. Degenerative change of the thoracic spine is noted. IMPRESSION: Chronic changes bilaterally. Nearly completely resolved changes in the right upper lobe when compare with the prior exam. Electronically Signed   By: Inez Catalina M.D.   On: 08/31/2016 15:31   Dg Chest 2 View  Result Date: 08/10/2016 CLINICAL DATA:  Recent pneumonia.  Cough. EXAM: CHEST  2 VIEW COMPARISON:  May 19, 2016 and July 17, 2016 FINDINGS: There is underlying scarring and fibrosis, most severe in the right upper lobe. There is slightly less opacity in this area compared to the most recent study suggesting there has been partial clearing of infiltrate from the right upper lobe. There remains slight increased opacity in this area compared to 3 months prior, however. This finding suggests that infiltrate is not completely cleared from the right upper lobe. There is no new opacity elsewhere. Heart size and pulmonary vascularity are normal. No adenopathy. There is aortic atherosclerosis. There is degenerative change in the mid thoracic region. IMPRESSION: Partial but incomplete clearing of infiltrate from the right upper lobe. Underlying scarring and fibrosis. No new opacity elsewhere. Stable cardiac silhouette. There is aortic atherosclerosis. Followup PA and lateral chest radiographs recommended in 3-4 weeks following trial of antibiotic therapy to ensure resolution and exclude underlying malignancy. Electronically Signed   By: Lowella Grip III M.D.   On: 08/10/2016 16:46     CBC  Recent Labs Lab 08/31/16 1453 09/01/16 0057  WBC 6.3 5.7  HGB 11.6* 11.0*  HCT 34.8* 32.7*  PLT 170 163  MCV 104.2* 102.8*  MCH 34.7* 34.6*  MCHC 33.3 33.6  RDW 13.2 13.2  LYMPHSABS 0.6*  --   MONOABS 0.6  --   EOSABS 0.1  --   BASOSABS 0.0  --     Chemistries   Recent Labs Lab 08/31/16 1453  NA 135  K 4.1  CL 106  CO2 23  GLUCOSE 206*  BUN 16  CREATININE 1.29*  CALCIUM 8.6*  AST 25  ALT 14*  ALKPHOS 44  BILITOT 0.8   ------------------------------------------------------------------------------------------------------------------ estimated creatinine clearance is 40.9 mL/min (A) (by C-G formula based on SCr of 1.29 mg/dL (H)). ------------------------------------------------------------------------------------------------------------------  Recent Labs  08/31/16 1945  HGBA1C 6.3*   ------------------------------------------------------------------------------------------------------------------ No results for input(s): CHOL, HDL, LDLCALC, TRIG, CHOLHDL, LDLDIRECT in the last 72 hours. ------------------------------------------------------------------------------------------------------------------ No results for input(s): TSH, T4TOTAL, T3FREE, THYROIDAB in the last 72 hours.  Invalid input(s): FREET3 ------------------------------------------------------------------------------------------------------------------ No results for input(s): VITAMINB12, FOLATE, FERRITIN, TIBC, IRON, RETICCTPCT in the last 72 hours.  Coagulation profile  Recent Labs Lab 08/31/16 1706 09/01/16  0057  INR 1.57 1.57    No results for input(s): DDIMER in the last 72 hours.  Cardiac Enzymes  Recent Labs Lab 08/31/16 1945 09/01/16 0057  TROPONINI 0.04* 0.04*   ------------------------------------------------------------------------------------------------------------------ Invalid input(s): POCBNP   CBG:  Recent  Labs Lab 08/31/16 2058 09/01/16 0702  GLUCAP 154* 103*       Studies: Dg Chest 2 View  Result Date: 08/31/2016 CLINICAL DATA:  Left-sided chest pain for 2 weeks with shortness of Breath EXAM: CHEST  2 VIEW COMPARISON:  08/10/2016 FINDINGS: Cardiac shadow is stable. The lungs again demonstrate bilateral fibrotic changes similar to that seen on the prior exam. The previously seen changes along the minor fissure in the upper lobe are less prominent on the current exam. Degenerative change of the thoracic spine is noted. IMPRESSION: Chronic changes bilaterally. Nearly completely resolved changes in the right upper lobe when compare with the prior exam. Electronically Signed   By: Inez Catalina M.D.   On: 08/31/2016 15:31      Lab Results  Component Value Date   HGBA1C 6.3 (H) 08/31/2016   HGBA1C 6.7 (H) 05/20/2016   HGBA1C 6.5 (H) 11/03/2015   Lab Results  Component Value Date   LDLCALC 60 05/20/2016   CREATININE 1.29 (H) 08/31/2016       Scheduled Meds: . atorvastatin  40 mg Oral q1800  . B-complex with vitamin C  1 tablet Oral Daily  . carvedilol  12.5 mg Oral BID WC  . cholecalciferol  1,000 Units Oral Daily  . donepezil  10 mg Oral QHS  . finasteride  5 mg Oral QHS  . glimepiride  1 mg Oral Daily  . insulin aspart  0-5 Units Subcutaneous QHS  . insulin aspart  0-9 Units Subcutaneous TID WC  . isosorbide mononitrate  120 mg Oral Daily  . linagliptin  5 mg Oral Daily  . [START ON 09/02/2016] lisinopril  2.5 mg Oral Daily  . pantoprazole  40 mg Oral Daily  . ranolazine  1,000 mg Oral BID  . Warfarin - Pharmacist Dosing Inpatient   Does not apply q1800   Continuous Infusions: . heparin 1,200 Units/hr (09/01/16 0200)     LOS: 0 days    Time spent: >30 MINS    Reyne Dumas  [facility] Pager [phone]. If 7PM-7AM, please contact night-coverage at [email or web address], password [facility] 09/01/2016, 9:29 AM  LOS: 0 days

## 2016-09-01 NOTE — Care Management Obs Status (Signed)
[facility] NOTIFICATION   Patient Details  Name: [name] MRN: [id number] Date of Birth: 1924/02/10   Medicare Observation Status Notification Given:  Yes    Dawayne Patricia, RN 09/01/2016, 4:24 PM

## 2016-09-01 NOTE — Progress Notes (Signed)
NM Myocar Multi W/Spect W/Wall Motion / EF    There was no ST segment deviation noted during stress. PVC's noted.  Defect 1: There is a medium defect of moderate severity present in the basal inferolateral and mid inferolateral location.  Findings consistent with prior myocardial infarction.  This is an intermediate risk study. No significant ischemia identified.  Nuclear stress EF: 48%. Diffuse hypokinesis.  The left ventricular ejection fraction is mildly decreased (45-54%).   Candee Furbish, MD   The patient has a moderate risk study with no significant ischemia noted. He is 83 years old with multiple comorbidities, recent cath in 05/2016 recommended medical treatment. Continuing with medical treatment for anginal pain is our current recommendation. He is on Imdur and Ranexa as well as SL nitro PRN. Amlodipine 2.5 mg was added by Dr. Orlean Patten yesterday but did not get ordered so it was not increased to 5 mg. Recommend prescribing Amlodipine 2.5 mg daily at discharge. Follow-up appointment scheduled with cardiology for 1 week from now.

## 2016-09-02 DIAGNOSIS — I200 Unstable angina: Secondary | ICD-10-CM | POA: Diagnosis not present

## 2016-09-02 DIAGNOSIS — C189 Malignant neoplasm of colon, unspecified: Secondary | ICD-10-CM

## 2016-09-02 DIAGNOSIS — C772 Secondary and unspecified malignant neoplasm of intra-abdominal lymph nodes: Secondary | ICD-10-CM | POA: Diagnosis not present

## 2016-09-02 LAB — GLUCOSE, CAPILLARY
GLUCOSE-CAPILLARY: 108 mg/dL — AB (ref 65–99)
Glucose-Capillary: 130 mg/dL — ABNORMAL HIGH (ref 65–99)

## 2016-09-02 LAB — CBC
HCT: 34.7 % — ABNORMAL LOW (ref 39.0–52.0)
HEMOGLOBIN: 11.6 g/dL — AB (ref 13.0–17.0)
MCH: 34.3 pg — AB (ref 26.0–34.0)
MCHC: 33.4 g/dL (ref 30.0–36.0)
MCV: 102.7 fL — AB (ref 78.0–100.0)
Platelets: 158 10*3/uL (ref 150–400)
RBC: 3.38 MIL/uL — AB (ref 4.22–5.81)
RDW: 13.1 % (ref 11.5–15.5)
WBC: 5.2 10*3/uL (ref 4.0–10.5)

## 2016-09-02 LAB — COMPREHENSIVE METABOLIC PANEL
ALK PHOS: 42 U/L (ref 38–126)
ALT: 16 U/L — AB (ref 17–63)
AST: 20 U/L (ref 15–41)
Albumin: 3.1 g/dL — ABNORMAL LOW (ref 3.5–5.0)
Anion gap: 7 (ref 5–15)
BILIRUBIN TOTAL: 0.9 mg/dL (ref 0.3–1.2)
BUN: 13 mg/dL (ref 6–20)
CALCIUM: 8.7 mg/dL — AB (ref 8.9–10.3)
CHLORIDE: 108 mmol/L (ref 101–111)
CO2: 23 mmol/L (ref 22–32)
CREATININE: 1.01 mg/dL (ref 0.61–1.24)
Glucose, Bld: 95 mg/dL (ref 65–99)
Potassium: 3.5 mmol/L (ref 3.5–5.1)
Sodium: 138 mmol/L (ref 135–145)
TOTAL PROTEIN: 6.4 g/dL — AB (ref 6.5–8.1)

## 2016-09-02 LAB — PROTIME-INR
INR: 1.68
PROTHROMBIN TIME: 20 s — AB (ref 11.4–15.2)

## 2016-09-02 MED ORDER — ENOXAPARIN SODIUM 100 MG/ML ~~LOC~~ SOLN: 1.0000 mg/kg | Freq: Two times a day (BID) | SUBCUTANEOUS | Status: DC

## 2016-09-02 MED ORDER — LISINOPRIL 2.5 MG PO TABS: 2.5000 mg | ORAL_TABLET | Freq: Every day | ORAL | 2 refills | Status: DC

## 2016-09-02 MED ORDER — POTASSIUM CHLORIDE CRYS ER 20 MEQ PO TBCR
40.0000 meq | EXTENDED_RELEASE_TABLET | Freq: Once | ORAL | Status: AC
  Administered 2016-09-02: 40 meq via ORAL
  Filled 2016-09-02: qty 2

## 2016-09-02 MED ORDER — ENOXAPARIN SODIUM 120 MG/0.8ML ~~LOC~~ SOLN
120.0000 mg | SUBCUTANEOUS | Status: DC
  Administered 2016-09-02: 120 mg via SUBCUTANEOUS
  Filled 2016-09-02: qty 0.8

## 2016-09-02 MED ORDER — AMLODIPINE BESYLATE 2.5 MG PO TABS: 2.5000 mg | ORAL_TABLET | Freq: Every day | ORAL | 2 refills | Status: DC

## 2016-09-02 MED ORDER — WARFARIN SODIUM 4 MG PO TABS: 4.0000 mg | ORAL_TABLET | Freq: Once | ORAL | Status: DC

## 2016-09-02 MED ORDER — ENOXAPARIN (LOVENOX) PATIENT EDUCATION KIT
PACK | Freq: Once | Status: DC
  Filled 2016-09-02: qty 1

## 2016-09-02 MED ORDER — ENOXAPARIN SODIUM 120 MG/0.8ML ~~LOC~~ SOLN: 120.0000 mg | SUBCUTANEOUS | 0 refills | Status: DC

## 2016-09-02 MED ORDER — WARFARIN SODIUM 4 MG PO TABS: ORAL_TABLET | ORAL | 1 refills | Status: DC

## 2016-09-02 MED ORDER — ENOXAPARIN SODIUM 100 MG/ML ~~LOC~~ SOLN: 90.0000 mg | Freq: Two times a day (BID) | SUBCUTANEOUS | 0 refills | Status: DC

## 2016-09-02 NOTE — Evaluation (Signed)
Physical Therapy Evaluation Patient Details Name: [name] MRN: [id number] DOB: 1923-09-06 Today's Date: 09/02/2016   History of Present Illness  Pt adm with chest pain. PMH - CAD, HTN, DM, chf, colon CA  Clinical Impression  Pt doing well with mobility and no further PT needed.  Ready for dc from PT standpoint.      Follow Up Recommendations No PT follow up    Equipment Recommendations  None recommended by PT    Recommendations for Other Services       Precautions / Restrictions Precautions Precautions: None Restrictions Weight Bearing Restrictions: No      Mobility  Bed Mobility               General bed mobility comments: Pt up in chair  Transfers Overall transfer level: Independent Equipment used: None                Ambulation/Gait Ambulation/Gait assistance: Independent Ambulation Distance (Feet): 600 Feet Assistive device: None Gait Pattern/deviations: WFL(Within Functional Limits)   Gait velocity interpretation: at or above normal speed for age/gender General Gait Details: Steady gait. HR 69 at rest and 83 with amb  Stairs            Wheelchair Mobility    Modified Rankin (Stroke Patients Only)       Balance Overall balance assessment: Independent (turns, stops, changes directions without difficulty)                                           Pertinent Vitals/Pain Pain Assessment: No/denies pain    Home Living Family/patient expects to be discharged to:: Private residence Living Arrangements: Alone   Type of Home: Other(Comment) (condo) Home Access: Level entry     Home Layout: One level Home Equipment: None      Prior Function Level of Independence: Independent         Comments: Drives     Hand Dominance        Extremity/Trunk Assessment   Upper Extremity Assessment Upper Extremity Assessment: Overall WFL for tasks assessed    Lower Extremity Assessment Lower Extremity  Assessment: Overall WFL for tasks assessed       Communication   Communication: No difficulties  Cognition Arousal/Alertness: Awake/alert Behavior During Therapy: WFL for tasks assessed/performed Overall Cognitive Status: Within Functional Limits for tasks assessed                                        General Comments      Exercises     Assessment/Plan    PT Assessment Patent does not need any further PT services  PT Problem List         PT Treatment Interventions      PT Goals (Current goals can be found in the Care Plan section)  Acute Rehab PT Goals PT Goal Formulation: All assessment and education complete, DC therapy    Frequency     Barriers to discharge        Co-evaluation               End of Session   Activity Tolerance: Patient tolerated treatment well Patient left: in chair;with call bell/phone within reach Nurse Communication: Mobility status PT Visit Diagnosis: Other (comment)    Time: 8921-1941  PT Time Calculation (min) (ACUTE ONLY): 8 min   Charges:   PT Evaluation $PT Eval Low Complexity: 1 Procedure     PT G Codes:   PT G-Codes **NOT FOR INPATIENT CLASS** Functional Assessment Tool Used: AM-PAC 6 Clicks Basic Mobility Functional Limitation: Mobility: Walking and moving around Mobility: Walking and Moving Around Current Status (D3143): 0 percent impaired, limited or restricted Mobility: Walking and Moving Around Goal Status (O8875): 0 percent impaired, limited or restricted Mobility: Walking and Moving Around Discharge Status [phone]): 0 percent impaired, limited or restricted    [facility] PT [place] 09/02/2016, 10:20 AM

## 2016-09-02 NOTE — Discharge Summary (Addendum)
Physician Discharge Summary  DAXX TIGGS MRN: [id number] DOB/AGE: 1924/05/08 83 y.o.  PCP: Irven Shelling, MD   Admit date: 08/31/2016 Discharge date: 09/02/2016  Discharge Diagnoses:    Principal Problem:   Chest pain Active Problems:   Paroxysmal atrial fibrillation (HCC)   Colon cancer metastasized to mesenteric lymph nodes (HCC)   Chronic systolic CHF (congestive heart failure) (HCC)   Non-insulin dependent type 2 diabetes mellitus (HCC)   Generalized weakness   Anemia, unspecified    Follow-up recommendations Follow-up with PCP in 3-5 days , including all  additional recommended appointments as below Follow-up CBC, CMP in 3-5 days  Patient to follow-up with cardiology as scheduled-primary cardiologist Thayer Headings, MD Patient needs INR check on 4/27     Current Discharge Medication List    START taking these medications   Details  amLODipine (NORVASC) 2.5 MG tablet Take 1 tablet (2.5 mg total) by mouth daily. Qty: 30 tablet, Refills: 2    enoxaparin (LOVENOX) 120 MG/0.8ML injection Inject 0.8 mLs (120 mg total) into the skin daily. Qty: 5 Syringe, Refills: 0      CONTINUE these medications which have CHANGED   Details  lisinopril (PRINIVIL,ZESTRIL) 2.5 MG tablet Take 1 tablet (2.5 mg total) by mouth daily. Qty: 30 tablet, Refills: 2    warfarin (COUMADIN) 4 MG tablet 4 mg-Friday, Saturday, Sunday, Monday, Wednesday  2 mg-Tuesday/Thursday Qty: 90 tablet, Refills: 1      CONTINUE these medications which have NOT CHANGED   Details  atorvastatin (LIPITOR) 40 MG tablet TAKE ONE TABLET BY MOUTH ONCE DAILY AT  6  PM Qty: 90 tablet, Refills: 2    B Complex-C (B-COMPLEX WITH VITAMIN C) tablet Take 1 tablet by mouth daily.    carvedilol (COREG) 25 MG tablet Take 0.5 tablets (12.5 mg total) by mouth 2 (two) times daily with a meal. Qty: 90 tablet, Refills: 3    Cholecalciferol (VITAMIN D-3 PO) Take 1 tablet by mouth daily.     finasteride  (PROSCAR) 5 MG tablet Take 5 mg by mouth at bedtime.     glimepiride (AMARYL) 1 MG tablet Take 1 mg by mouth daily.     isosorbide mononitrate (IMDUR) 120 MG 24 hr tablet Take 1 tablet (120 mg total) by mouth daily. Qty: 30 tablet, Refills: 1    mupirocin ointment (BACTROBAN) 2 % Apply 1 application topically daily as needed (to affected area on forehead).     nitroGLYCERIN (NITROSTAT) 0.4 MG SL tablet PLACE ONE TABLET UNDER THE TONGUE EVERY 5 MINUTES AS NEEDED FOR CHEST PAIN. Qty: 25 tablet, Refills: 23    pantoprazole (PROTONIX) 40 MG tablet Take 1 tablet (40 mg total) by mouth daily. Qty: 30 tablet, Refills: 1    Propylene Glycol (SYSTANE BALANCE) 0.6 % SOLN Apply 1-2 drops to eye 3 (three) times daily as needed (for dry eyes).    RANEXA 1000 MG SR tablet TAKE ONE TABLET BY MOUTH TWICE DAILY Qty: 180 tablet, Refills: 3    donepezil (ARICEPT) 10 MG tablet Take 10 mg by mouth at bedtime.     linagliptin (TRADJENTA) 5 MG TABS tablet Take 1 tablet (5 mg total) by mouth daily. For blood sugar Qty: 30 tablet, Refills: 1          Discharge Condition: Stable   Discharge Instructions Get Medicines reviewed and adjusted: Please take all your medications with you for your next visit with your Primary MD  Please request your Primary MD to go over all  hospital tests and procedure/radiological results at the follow up, please ask your Primary MD to get all Hospital records sent to his/her office.  If you experience worsening of your admission symptoms, develop shortness of breath, life threatening emergency, suicidal or homicidal thoughts you must seek medical attention immediately by calling 911 or calling your MD immediately if symptoms less severe.  You must read complete instructions/literature along with all the possible adverse reactions/side effects for all the Medicines you take and that have been prescribed to you. Take any new Medicines after you have completely understood  and accpet all the possible adverse reactions/side effects.   Do not drive when taking Pain medications.   Do not take more than prescribed Pain, Sleep and Anxiety Medications  Special Instructions: If you have smoked or chewed Tobacco in the last 2 yrs please stop smoking, stop any regular Alcohol and or any Recreational drug use.  Wear Seat belts while driving.  Please note  You were cared for by a hospitalist during your hospital stay. Once you are discharged, your primary care physician will handle any further medical issues. Please note that NO REFILLS for any discharge medications will be authorized once you are discharged, as it is imperative that you return to your primary care physician (or establish a relationship with a primary care physician if you do not have one) for your aftercare needs so that they can reassess your need for medications and monitor your lab values.     Allergies  Allergen Reactions  . Metformin And Related Nausea And Vomiting      Disposition: 01-Home or Self Care   Consults:  Cardiology    Significant Diagnostic Studies:  Dg Chest 2 View  Result Date: 08/31/2016 CLINICAL DATA:  Left-sided chest pain for 2 weeks with shortness of Breath EXAM: CHEST  2 VIEW COMPARISON:  08/10/2016 FINDINGS: Cardiac shadow is stable. The lungs again demonstrate bilateral fibrotic changes similar to that seen on the prior exam. The previously seen changes along the minor fissure in the upper lobe are less prominent on the current exam. Degenerative change of the thoracic spine is noted. IMPRESSION: Chronic changes bilaterally. Nearly completely resolved changes in the right upper lobe when compare with the prior exam. Electronically Signed   By: Inez Catalina M.D.   On: 08/31/2016 15:31   Dg Chest 2 View  Result Date: 08/10/2016 CLINICAL DATA:  Recent pneumonia.  Cough. EXAM: CHEST  2 VIEW COMPARISON:  May 19, 2016 and July 17, 2016 FINDINGS: There is  underlying scarring and fibrosis, most severe in the right upper lobe. There is slightly less opacity in this area compared to the most recent study suggesting there has been partial clearing of infiltrate from the right upper lobe. There remains slight increased opacity in this area compared to 3 months prior, however. This finding suggests that infiltrate is not completely cleared from the right upper lobe. There is no new opacity elsewhere. Heart size and pulmonary vascularity are normal. No adenopathy. There is aortic atherosclerosis. There is degenerative change in the mid thoracic region. IMPRESSION: Partial but incomplete clearing of infiltrate from the right upper lobe. Underlying scarring and fibrosis. No new opacity elsewhere. Stable cardiac silhouette. There is aortic atherosclerosis. Followup PA and lateral chest radiographs recommended in 3-4 weeks following trial of antibiotic therapy to ensure resolution and exclude underlying malignancy. Electronically Signed   By: Lowella Grip III M.D.   On: 08/10/2016 16:46     Nm Myocar Multi W/spect  W/wall Motion / Ef  Result Date: 09/01/2016  There was no ST segment deviation noted during stress. PVC's noted.  Defect 1: There is a medium defect of moderate severity present in the basal inferolateral and mid inferolateral location.  Findings consistent with prior myocardial infarction.  This is an intermediate risk study. No significant ischemia identified.  Nuclear stress EF: 48%. Diffuse hypokinesis.  The left ventricular ejection fraction is mildly decreased (45-54%).  Candee Furbish, MD          Filed Weights   08/31/16 1848  Weight: 87.9 kg (193 lb 11.2 oz)     Microbiology: No results found for this or any previous visit (from the past 240 hour(s)).     Blood Culture    Component Value Date/Time   SDES URINE, CLEAN CATCH 06/07/2014 1924   SPECREQUEST NONE 06/07/2014 1924   CULT  06/07/2014 1924    INSIGNIFICANT  GROWTH Performed at [facility] 06/09/2014 FINAL 06/07/2014 1924      Labs: Results for orders placed or performed during the hospital encounter of 08/31/16 (from the past 48 hour(s))  Comprehensive metabolic panel     Status: Abnormal   Collection Time: 08/31/16  2:53 PM  Result Value Ref Range   Sodium 135 135 - 145 mmol/L   Potassium 4.1 3.5 - 5.1 mmol/L   Chloride 106 101 - 111 mmol/L   CO2 23 22 - 32 mmol/L   Glucose, Bld 206 (H) 65 - 99 mg/dL   BUN 16 6 - 20 mg/dL   Creatinine, Ser 1.29 (H) 0.61 - 1.24 mg/dL   Calcium 8.6 (L) 8.9 - 10.3 mg/dL   Total Protein 6.2 (L) 6.5 - 8.1 g/dL   Albumin 3.0 (L) 3.5 - 5.0 g/dL   AST 25 15 - 41 U/L   ALT 14 (L) 17 - 63 U/L   Alkaline Phosphatase 44 38 - 126 U/L   Total Bilirubin 0.8 0.3 - 1.2 mg/dL   GFR calc non Af Amer 47 (L) >60 mL/min   GFR calc Af Amer 54 (L) >60 mL/min    Comment: (NOTE) The eGFR has been calculated using the CKD EPI equation. This calculation has not been validated in all clinical situations. eGFR's persistently <60 mL/min signify possible Chronic Kidney Disease.    Anion gap 6 5 - 15  CBC with Differential     Status: Abnormal   Collection Time: 08/31/16  2:53 PM  Result Value Ref Range   WBC 6.3 4.0 - 10.5 K/uL   RBC 3.34 (L) 4.22 - 5.81 MIL/uL   Hemoglobin 11.6 (L) 13.0 - 17.0 g/dL   HCT 34.8 (L) 39.0 - 52.0 %   MCV 104.2 (H) 78.0 - 100.0 fL   MCH 34.7 (H) 26.0 - 34.0 pg   MCHC 33.3 30.0 - 36.0 g/dL   RDW 13.2 11.5 - 15.5 %   Platelets 170 150 - 400 K/uL   Neutrophils Relative % 81 %   Neutro Abs 5.0 1.7 - 7.7 K/uL   Lymphocytes Relative 9 %   Lymphs Abs 0.6 (L) 0.7 - 4.0 K/uL   Monocytes Relative 9 %   Monocytes Absolute 0.6 0.1 - 1.0 K/uL   Eosinophils Relative 1 %   Eosinophils Absolute 0.1 0.0 - 0.7 K/uL   Basophils Relative 0 %   Basophils Absolute 0.0 0.0 - 0.1 K/uL  I-stat troponin, ED     Status: None   Collection Time: 08/31/16  2:59 PM  Result Value Ref  Range   Troponin i, poc 0.01 0.00 - 0.08 ng/mL   Comment 3            Comment: Due to the release kinetics of cTnI, a negative result within the first hours of the onset of symptoms does not rule out myocardial infarction with certainty. If myocardial infarction is still suspected, repeat the test at appropriate intervals.   Protime-INR     Status: Abnormal   Collection Time: 08/31/16  5:06 PM  Result Value Ref Range   Prothrombin Time 19.0 (H) 11.4 - 15.2 seconds   INR 1.57   Urinalysis, Routine w reflex microscopic     Status: Abnormal   Collection Time: 08/31/16  5:46 PM  Result Value Ref Range   Color, Urine AMBER (A) YELLOW    Comment: BIOCHEMICALS MAY BE AFFECTED BY COLOR   APPearance HAZY (A) CLEAR   Specific Gravity, Urine 1.025 1.005 - 1.030   pH 5.0 5.0 - 8.0   Glucose, UA NEGATIVE NEGATIVE mg/dL   Hgb urine dipstick NEGATIVE NEGATIVE   Bilirubin Urine NEGATIVE NEGATIVE   Ketones, ur NEGATIVE NEGATIVE mg/dL   Protein, ur 100 (A) NEGATIVE mg/dL   Nitrite NEGATIVE NEGATIVE   Leukocytes, UA NEGATIVE NEGATIVE   RBC / HPF 0-5 0 - 5 RBC/hpf   WBC, UA 0-5 0 - 5 WBC/hpf   Bacteria, UA NONE SEEN NONE SEEN   Squamous Epithelial / LPF 0-5 (A) NONE SEEN   Mucous PRESENT    Hyaline Casts, UA PRESENT   Troponin I     Status: Abnormal   Collection Time: 08/31/16  7:45 PM  Result Value Ref Range   Troponin I 0.04 (HH) <0.03 ng/mL    Comment: CRITICAL RESULT CALLED TO, READ BACK BY AND VERIFIED WITHDudley Major RN [phone] [phone]   Hemoglobin A1c     Status: Abnormal   Collection Time: 08/31/16  7:45 PM  Result Value Ref Range   Hgb A1c MFr Bld 6.3 (H) 4.8 - 5.6 %    Comment: (NOTE)         Pre-diabetes: 5.7 - 6.4         Diabetes: >6.4         Glycemic control for adults with diabetes: <7.0    Mean Plasma Glucose 134 mg/dL    Comment: (NOTE) Performed At: [facility] [address] [place], [place] [id number] Lindon Romp MD UR:[id number]    Glucose, capillary     Status: Abnormal   Collection Time: 08/31/16  8:58 PM  Result Value Ref Range   Glucose-Capillary 154 (H) 65 - 99 mg/dL   Comment 1 Notify RN   Troponin I     Status: Abnormal   Collection Time: 09/01/16 12:57 AM  Result Value Ref Range   Troponin I 0.04 (HH) <0.03 ng/mL    Comment: CRITICAL VALUE NOTED.  VALUE IS CONSISTENT WITH PREVIOUSLY REPORTED AND CALLED VALUE.  Protime-INR     Status: Abnormal   Collection Time: 09/01/16 12:57 AM  Result Value Ref Range   Prothrombin Time 18.9 (H) 11.4 - 15.2 seconds   INR 1.57   Heparin level (unfractionated)     Status: Abnormal   Collection Time: 09/01/16 12:57 AM  Result Value Ref Range   Heparin Unfractionated 0.29 (L) 0.30 - 0.70 IU/mL    Comment:        IF HEPARIN RESULTS ARE BELOW EXPECTED VALUES, AND PATIENT DOSAGE HAS BEEN CONFIRMED,  SUGGEST FOLLOW UP TESTING OF ANTITHROMBIN III LEVELS.   CBC     Status: Abnormal   Collection Time: 09/01/16 12:57 AM  Result Value Ref Range   WBC 5.7 4.0 - 10.5 K/uL   RBC 3.18 (L) 4.22 - 5.81 MIL/uL   Hemoglobin 11.0 (L) 13.0 - 17.0 g/dL   HCT 32.7 (L) 39.0 - 52.0 %   MCV 102.8 (H) 78.0 - 100.0 fL   MCH 34.6 (H) 26.0 - 34.0 pg   MCHC 33.6 30.0 - 36.0 g/dL   RDW 13.2 11.5 - 15.5 %   Platelets 163 150 - 400 K/uL  Glucose, capillary     Status: Abnormal   Collection Time: 09/01/16  7:02 AM  Result Value Ref Range   Glucose-Capillary 103 (H) 65 - 99 mg/dL   Comment 1 Notify RN    Comment 2 Document in Chart   Troponin I     Status: Abnormal   Collection Time: 09/01/16  1:53 PM  Result Value Ref Range   Troponin I 0.03 (HH) <0.03 ng/mL    Comment: CRITICAL VALUE NOTED.  VALUE IS CONSISTENT WITH PREVIOUSLY REPORTED AND CALLED VALUE.  Heparin level (unfractionated)     Status: Abnormal   Collection Time: 09/01/16  1:53 PM  Result Value Ref Range   Heparin Unfractionated 1.02 (H) 0.30 - 0.70 IU/mL    Comment:        IF HEPARIN RESULTS ARE BELOW EXPECTED VALUES,  AND PATIENT DOSAGE HAS BEEN CONFIRMED, SUGGEST FOLLOW UP TESTING OF ANTITHROMBIN III LEVELS.   Basic metabolic panel     Status: Abnormal   Collection Time: 09/01/16  1:53 PM  Result Value Ref Range   Sodium 136 135 - 145 mmol/L   Potassium 4.5 3.5 - 5.1 mmol/L   Chloride 105 101 - 111 mmol/L   CO2 25 22 - 32 mmol/L   Glucose, Bld 180 (H) 65 - 99 mg/dL   BUN 15 6 - 20 mg/dL   Creatinine, Ser 1.02 0.61 - 1.24 mg/dL   Calcium 8.6 (L) 8.9 - 10.3 mg/dL   GFR calc non Af Amer >60 >60 mL/min   GFR calc Af Amer >60 >60 mL/min    Comment: (NOTE) The eGFR has been calculated using the CKD EPI equation. This calculation has not been validated in all clinical situations. eGFR's persistently <60 mL/min signify possible Chronic Kidney Disease.    Anion gap 6 5 - 15  Magnesium     Status: None   Collection Time: 09/01/16  1:53 PM  Result Value Ref Range   Magnesium 1.9 1.7 - 2.4 mg/dL  Glucose, capillary     Status: Abnormal   Collection Time: 09/01/16  4:03 PM  Result Value Ref Range   Glucose-Capillary 203 (H) 65 - 99 mg/dL  Glucose, capillary     Status: Abnormal   Collection Time: 09/01/16  8:24 PM  Result Value Ref Range   Glucose-Capillary 129 (H) 65 - 99 mg/dL  Heparin level (unfractionated)     Status: None   Collection Time: 09/01/16 11:13 PM  Result Value Ref Range   Heparin Unfractionated 0.57 0.30 - 0.70 IU/mL    Comment:        IF HEPARIN RESULTS ARE BELOW EXPECTED VALUES, AND PATIENT DOSAGE HAS BEEN CONFIRMED, SUGGEST FOLLOW UP TESTING OF ANTITHROMBIN III LEVELS.   Protime-INR     Status: Abnormal   Collection Time: 09/02/16  3:59 AM  Result Value Ref Range   Prothrombin Time 20.0 (  H) 11.4 - 15.2 seconds   INR 1.68   CBC     Status: Abnormal   Collection Time: 09/02/16  3:59 AM  Result Value Ref Range   WBC 5.2 4.0 - 10.5 K/uL   RBC 3.38 (L) 4.22 - 5.81 MIL/uL   Hemoglobin 11.6 (L) 13.0 - 17.0 g/dL   HCT 34.7 (L) 39.0 - 52.0 %   MCV 102.7 (H) 78.0 - 100.0  fL   MCH 34.3 (H) 26.0 - 34.0 pg   MCHC 33.4 30.0 - 36.0 g/dL   RDW 13.1 11.5 - 15.5 %   Platelets 158 150 - 400 K/uL  Comprehensive metabolic panel     Status: Abnormal   Collection Time: 09/02/16  3:59 AM  Result Value Ref Range   Sodium 138 135 - 145 mmol/L   Potassium 3.5 3.5 - 5.1 mmol/L    Comment: DELTA CHECK NOTED   Chloride 108 101 - 111 mmol/L   CO2 23 22 - 32 mmol/L   Glucose, Bld 95 65 - 99 mg/dL   BUN 13 6 - 20 mg/dL   Creatinine, Ser 1.01 0.61 - 1.24 mg/dL   Calcium 8.7 (L) 8.9 - 10.3 mg/dL   Total Protein 6.4 (L) 6.5 - 8.1 g/dL   Albumin 3.1 (L) 3.5 - 5.0 g/dL   AST 20 15 - 41 U/L   ALT 16 (L) 17 - 63 U/L   Alkaline Phosphatase 42 38 - 126 U/L   Total Bilirubin 0.9 0.3 - 1.2 mg/dL   GFR calc non Af Amer >60 >60 mL/min   GFR calc Af Amer >60 >60 mL/min    Comment: (NOTE) The eGFR has been calculated using the CKD EPI equation. This calculation has not been validated in all clinical situations. eGFR's persistently <60 mL/min signify possible Chronic Kidney Disease.    Anion gap 7 5 - 15  Glucose, capillary     Status: Abnormal   Collection Time: 09/02/16  6:21 AM  Result Value Ref Range   Glucose-Capillary 108 (H) 65 - 99 mg/dL     Lipid Panel     Component Value Date/Time   CHOL 122 05/20/2016 0101   TRIG 70 05/20/2016 0101   HDL 48 05/20/2016 0101   CHOLHDL 2.5 05/20/2016 0101   VLDL 14 05/20/2016 0101   LDLCALC 60 05/20/2016 0101     Lab Results  Component Value Date   HGBA1C 6.3 (H) 08/31/2016   HGBA1C 6.7 (H) 05/20/2016   HGBA1C 6.5 (H) 11/03/2015      HPI :  83 y.o.male,History of CAD status post multiple stents last drug-eluting stent in LAD in 2012, known history of LAD and RCA disease with 80% stenosis in LAD, DM type II, paroxysmal atrial fibrillation on Coumadin, hypertension, dyslipidemia, history of Barrett's esophagus and colon cancer, history of GI bleed in the past who has been admitted recently for chest pain workup and is  under the care of Dr. [name] comes back with an episode of left-sided chest pressure which she says usually happens on every 24-48 hours but goes away when he takes 1-2 nitroglycerin tablets,. Patient evaluated by cardiology 4/24. His ECG shows SR, RBBB that is chronic, telemetry shows new frequent PVCs  HOSPITAL COURSE:  1. Chest pain in a patient with known CAD. Does have known 80% LAD disease, Currently on medical management .ECG w/ RBBB and no change  , nuclear stress test results from  4/25  as above.He is on Imdur and Ranexa as well  as SL nitro PRN. Amlodipine 2.5 mg was added .  continue   Coreg, statin, . Patient is on Coumadin at home, INR was subtherapeutic upon admission and the patient was initiated on heparin drip . Heparin drip has been discontinued for stress test. As per cardiology patient has a moderate risk study with no significant ischemia noted. He is 83 years old with multiple comorbidities, recent cath in 05/2016 recommended medical treatment. Continuing with medical treatment for anginal pain is our current recommendation. .   2. elevated creatinine. Creatinine 1.29 patient hydrated overnight,  repeat creatinine 1.01  3.Hypertension for now continue on present dose Coreg, Imdur, ACE inhibitor,  4.Dyslipidemia. Continue home dose statin unchanged.  5.BPH. On Proscar continue.  6.Mild cognitive dysfunction. On Aricept which will be continued.  7.DM type II. Hemoglobin A1c 6.3, continue home medications which include Amaryl and Trajenta, add sliding scale.   8.Paroxysmal atrial fibrillation. [place] vasc 2 score of at least 3. Continue beta blocker, pharmacy to monitor Coumadin dose. Heparin drip as INR subtherapeutic. INR was 1.68 on 4/26. Heparin drip has been discontinued and the patient has been transitioned to weight-based Lovenox until INR is greater than 2.0. Next INR check. 4/27  9.History of Barrett's esophagus, colon cancer and GI bleed. Currently on  Coumadin and PPI which will be continued, note he is not on antiplatelets on a regular basis due to his past GI history.      Discharge Exam:   Blood pressure (!) 131/53, pulse 80, temperature 98.1 F (36.7 C), temperature source Oral, resp. rate 18, height '5\' 9"'$  (1.753 m), weight 87.9 kg (193 lb 11.2 oz), SpO2 96 %.  General: Well developed, well nourished, male in no acute distress Head: Eyes PERRLA, No xanthomas.   Normocephalic and atraumatic, oropharynx without edema or exudate. Dentition: fair Lungs: few rales bases Heart: Heart irregular rate and rhythm with S1, S2, 2/6 murmur. pulses are 2+ all 4 extrem.  Neck: No carotid bruits. No lymphadenopathy.  JVD not elevated Abdomen: Bowel sounds present, abdomen soft and non-tender without masses or hernias noted. Msk:  No spine or cva tenderness. No weakness, no joint deformities or effusions. Extremities: No clubbing or cyanosis. No edema.  Neuro: Alert and oriented X 3. No focal deficits noted    Follow-up Information    Irven Shelling, MD. Call.   Specialty:  Internal Medicine Why:  Hospital follow-up in 3-5 days INR check 4/27 Contact information: [address] [place] [address] [phone]        Mertie Moores, MD. Call.   Specialty:  Cardiology Why:  Established follow-up appointment Contact information: [address] [place] [address] [phone]           Signed: Reyne Dumas 09/02/2016, 8:21 AM        Time spent >45 mins

## 2016-09-02 NOTE — Care Management Note (Signed)
Case Management Note Marvetta Gibbons RN, BSN Unit 2W-Case Manager [phone]  Patient Details  Name: DENZELL COLASANTI MRN: [id number] Date of Birth: 1923-09-28  Subjective/Objective:  Pt presented with chest pain/afib                  Action/Plan: PTA pt lived at home, f/u appointment made for INR check with Dr. Laurann Montana on 4/27 between 9:30 and 11:30 also made an appointment for hospital f/u for May 3 at 12:45- placed these on AVS- pt also being d/c'd on Lovenox - call made to Pharmacy to check if drug in stock- they do have 4 syringes to fill and can order more to be in stock tomorrow. Cost for pt is $23.20.   Expected Discharge Date:  09/02/16               Expected Discharge Plan:  Home/Self Care  In-House Referral:     Discharge planning Services  CM Consult, Follow-up appt scheduled, Medication Assistance  Post Acute Care Choice:    Choice offered to:     DME Arranged:    DME Agency:     HH Arranged:    HH Agency:     Status of Service:  Completed, signed off  If discussed at [employer] of Stay Meetings, dates discussed:    Additional Comments:  Dawayne Patricia, RN 09/02/2016, 11:54 AM

## 2016-09-02 NOTE — Progress Notes (Signed)
ANTICOAGULATION CONSULT NOTE - Follow Up Consult  Pharmacy Consult for heparin Indication: atrial fibrillation  Labs:  Recent Labs  08/31/16 1453 08/31/16 1706 08/31/16 1945 09/01/16 0057 09/01/16 1353 09/01/16 2313  HGB 11.6*  --   --  11.0*  --   --   HCT 34.8*  --   --  32.7*  --   --   PLT 170  --   --  163  --   --   LABPROT  --  19.0*  --  18.9*  --   --   INR  --  1.57  --  1.57  --   --   HEPARINUNFRC  --   --   --  0.29* 1.02* 0.57  CREATININE 1.29*  --   --   --  1.02  --   TROPONINI  --   --  0.04* 0.04* 0.03*  --     Assessment/Plan:  83yo male therapeutic on heparin after rate changes. Will continue gtt at current rate and confirm stable with am labs.   Wynona Neat, PharmD, BCPS  09/02/2016,12:00 AM

## 2016-09-02 NOTE — Progress Notes (Addendum)
ANTICOAGULATION CONSULT NOTE - Initial Consult  Pharmacy Consult for Coumadin / Lovenox Indication: atrial fibrillation  Allergies  Allergen Reactions  . Metformin And Related Nausea And Vomiting   Patient Measurements: Height: 5\' 9"  (175.3 cm) Weight: 193 lb 11.2 oz (87.9 kg) IBW/kg (Calculated) : 70.7  Vital Signs: Temp: 98.1 F (36.7 C) (04/26 0422) Temp Source: Oral (04/26 0422) BP: 131/53 (04/26 0422) Pulse Rate: 80 (04/26 0422)  Assessment: 83 yo Blackwell presents on 4/24 with chest pain. On Coumadin 4mg  daily PTA for afib except for 2mg  on Tues/Thurs PTA for Afib. INR on admit low at 1.57, now up to 1.68. Pharmacy consulted to dose heparin bridge + coumadin, now consulted to transition from heparin to Lovenox. CBC stable since admit, CrCl~50. No bleeding reported - noted hx of GIB in the past. Spoke with MD - feels this patient does need bridging on d/c. Will dose once daily but will round dose down for conservative dosing with advanced age.  Communicated with RN to start Lovenox 1 hr after heparin drip d/c'd.  Goal of Therapy:  INR 2-3 Monitor platelets by anticoagulation protocol: Yes   Plan:  D/c heparin IV > start Lovenox 120mg  (~1.5 mg/kg) [place] q24hr Coumadin 4mg  PO x 1 tonight Monitor daily INR, CBC at least q72h on Lovenox, s/s of bleed   Elicia Lamp, PharmD, BCPS Clinical Pharmacist 09/02/2016 8:25 AM

## 2016-09-08 ENCOUNTER — Encounter (INDEPENDENT_AMBULATORY_CARE_PROVIDER_SITE_OTHER): Payer: Self-pay

## 2016-09-08 ENCOUNTER — Ambulatory Visit (INDEPENDENT_AMBULATORY_CARE_PROVIDER_SITE_OTHER): Payer: Medicare Other | Admitting: Physician Assistant

## 2016-09-08 ENCOUNTER — Encounter: Payer: Self-pay | Admitting: Physician Assistant

## 2016-09-08 VITALS — Ht 69.0 in | Wt 150.1 lb

## 2016-09-08 DIAGNOSIS — I5032 Chronic diastolic (congestive) heart failure: Secondary | ICD-10-CM

## 2016-09-08 DIAGNOSIS — E785 Hyperlipidemia, unspecified: Secondary | ICD-10-CM

## 2016-09-08 DIAGNOSIS — I480 Paroxysmal atrial fibrillation: Secondary | ICD-10-CM | POA: Diagnosis not present

## 2016-09-08 DIAGNOSIS — I10 Essential (primary) hypertension: Secondary | ICD-10-CM

## 2016-09-08 DIAGNOSIS — I25119 Atherosclerotic heart disease of native coronary artery with unspecified angina pectoris: Secondary | ICD-10-CM | POA: Diagnosis not present

## 2016-09-08 DIAGNOSIS — E119 Type 2 diabetes mellitus without complications: Secondary | ICD-10-CM

## 2016-09-08 MED ORDER — AMLODIPINE BESYLATE 5 MG PO TABS: 5.0000 mg | ORAL_TABLET | Freq: Every day | ORAL | 3 refills | Status: DC

## 2016-09-08 NOTE — Progress Notes (Signed)
Cardiology Office Note:    Date:  09/08/2016   ID:  [name], DOB 1924-03-22, MRN [id number]  PCP:  Irven Shelling, MD  Cardiologist:  Dr. Liam Rogers   Electrophysiologist:  n/a  Referring MD: Lavone Orn, MD   Chief Complaint  Patient presents with  . Hospitalization Follow-up    chest pain    History of Present Illness:    [name] is a 83 y.o. male with a hx of CAD s/p prior PCI to the LAD in 2012, chronic PVCs/PACs, HTN, diabetes, HL, systolic CHF, PAF (failed TEE-DCCV), prior lower GI bleed, metastatic colon CA. LHC in 2015 demonstrated patent stent. [name] been managed with antianginal therapy for chest discomfort. He was admitted 6/17 with chest pain and near syncope with a blood pressure of 80/40. Beta blocker and nitrate dose were both adjusted. Echocardiogram demonstrated normal LV function. Patient was admitted in 1/18 with chest pain. LHC demonstrated patent LAD stent and stable anatomy. Medical therapy was continued. He was admitted again in 3/18 with chest pain. Medical therapy was continued. Last seen by Dr. [name] 08/03/16.  CHADS2-VASc= 6 (age, HTN, CHF, vascular disease, diabetes).    Patient was admitted 4/24-4/26 with chest pain. He had minimally elevated troponin levels with flat trend. Inpatient nuclear study demonstrated inferolateral infarct but no ischemia. Medical therapy was continued.  He returns for posthospitalization follow-up. He is here alone.  He notes he took NTG x 3 last night with relief of chest pain.  However, he does not L sided chest pain today that is more of a discomfort.  Overall, his chronic chest pain is stable without significant change.  He denies exertional chest pain.  He has chronic dyspnea on exertion without change.  He denies orthopnea, PND, edema, syncope.  He denies pleuritic chest pain.  BP is high today but he has not taken any medications.  He denies gross rectal bleeding but does have occ red blood per rectum  that is present on tissue only.    Prior CV studies:   The following studies were reviewed today:  Myoview 09/01/16 EF 48, PVCs, inferolateral infarct, no ischemia; intermediate risk  LHC 05/20/16 LAD proximal stent patent, D2 80 RCA ostial 50 Normal EF  Echo 06/15/03 Normal systolic function, grade 1 diastolic dysfunction, mild aortic stenosis (mean 13, peak 22), RA band cor triatriatum   Carotid US 11/04/15 Bilateral - 1% to 39% ICA stenosis. Vertebral artery flow was antegrade.   LHC 4/15 LM ok   LAD patent. Mid and dist 30, D1 80 prox LCx < 20 PDA ostial 60 EF 55-65   Myoview 8/14 No ischemia, EF 47  Past Medical History:  Diagnosis Date  . Aortic stenosis    a. Mild-mod by echo 2013.  . Barrett esophagus   . Chronic anticoagulation   . Chronic systolic CHF (congestive heart failure) (HCC)    a. EF previously 35-40%. b. improved to 55-65% by cath 08/2013.  [name] Colon cancer ([place])    a. 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 b.  "graduated" from their practice 06/2013, remains free of any new disease per those notes.  . Coronary artery disease    a. s/p PCI w/ DES to mLAD 08/05/10. b. NSTEMI 08/2013 (mildly elev trop) - cath showing widely patent stent, 80% prox D1 (small vessel) with recommendation for medical management, residual 60% ostial PDA, <20% LCx, LVEF 55-65%.   . Depression   . Gallstone pancreatitis 2011   a.  s/p chole.  [name] GERD (gastroesophageal reflux disease)   . Hyperlipidemia   . Hypertension   . Macrocytic anemia 06/25/2011  . Myelodysplastic disease ([place])    a. Suspected low-grade  myelodysplastic disease per onc notes.  . Other pancytopenia ([place]) 06/18/2014   Mild, fluctuating, likely med related. Rule out early MDS from prior chemo/RT or colon cancer  . PAF (paroxysmal atrial fibrillation) ([place])    a. failed TEE due to inability to pass probe into the esophagus in March 2013. b. On Coumadin. Was in NSR 08/2013 admission.  . Premature atrial  contractions   . Premature ventricular contractions   . SBO (small bowel obstruction) ([place]) 2005  . Type II diabetes mellitus ([place])     Past Surgical History:  Procedure Laterality Date  . ABDOMINAL MASS RESECTION     Mass near mesentery  . APPENDECTOMY     "took out during colon surgery"  . CARDIAC CATHETERIZATION  08/03/10  . CARDIAC CATHETERIZATION  08/09/10   LAD 30, stent OK, CFX 40, RCA < 20  . CARDIAC CATHETERIZATION N/A 05/20/2016   Procedure: Left Heart Cath and Coronary Angiography;  Surgeon: Lorretta Harp, MD;  Location: [facility];  Service: Cardiovascular;  Laterality: N/A;  . CATARACT EXTRACTION Bilateral   . CHOLECYSTECTOMY  2011  . COLON SURGERY    . CORONARY ANGIOPLASTY WITH STENT PLACEMENT  08/05/10   DES to the LAD  . CYSTOSCOPY     "with removal of kidney stone in office"  . CYSTOSCOPY WITH RETROGRADE PYELOGRAM, URETEROSCOPY AND STENT PLACEMENT Bilateral 05/18/2013   Procedure: CYSTOSCOPY WITH BILATERAL RETROGRADE PYELOGRAM, LEFT URETEROSCOPY AND LEFT STENT PLACEMENT;  Surgeon: Alexis Frock, MD;  Location: [facility];  Service: Urology;  Laterality: Bilateral;  . LEFT HEART CATHETERIZATION WITH CORONARY ANGIOGRAM N/A 08/15/2013   Procedure: LEFT HEART CATHETERIZATION WITH CORONARY ANGIOGRAM;  Surgeon: Peter M Martinique, MD;  Location[facility];  Service: Cardiovascular;  Laterality: N/A;  . RIGHT COLECTOMY    . TONSILLECTOMY      Current Medications: Current Meds  Medication Sig  . atorvastatin (LIPITOR) 40 MG tablet TAKE ONE TABLET BY MOUTH ONCE DAILY AT  6  PM  . B Complex-C (B-COMPLEX WITH VITAMIN C) tablet Take 1 tablet by mouth daily.  . carvedilol (COREG) 25 MG tablet Take 0.5 tablets (12.5 mg total) by mouth 2 (two) times daily with a meal.  . Cholecalciferol (VITAMIN D-3 PO) Take 1 tablet by mouth daily.   [name] donepezil (ARICEPT) 10 MG tablet Take 10 mg by mouth at bedtime.   . finasteride (PROSCAR) 5 MG tablet Take 5 mg by mouth at bedtime.   [name]  glimepiride (AMARYL) 1 MG tablet Take 1 mg by mouth daily.   . isosorbide mononitrate (IMDUR) 120 MG 24 hr tablet Take 1 tablet (120 mg total) by mouth daily.  [name] linagliptin (TRADJENTA) 5 MG TABS tablet Take 1 tablet (5 mg total) by mouth daily. For blood sugar  . lisinopril (PRINIVIL,ZESTRIL) 2.5 MG tablet Take 1 tablet (2.5 mg total) by mouth daily.  . mupirocin ointment (BACTROBAN) 2 % Apply 1 application topically daily as needed (to affected area on forehead).   . nitroGLYCERIN (NITROSTAT) 0.4 MG SL tablet PLACE ONE TABLET UNDER THE TONGUE EVERY 5 MINUTES AS NEEDED FOR CHEST PAIN.  [name] pantoprazole (PROTONIX) 40 MG tablet Take 1 tablet (40 mg total) by mouth daily.  [name] Propylene Glycol (SYSTANE BALANCE) 0.6 % SOLN Apply 1-2 drops to eye 3 (  three) times daily as needed (for dry eyes).  . RANEXA 1000 MG SR tablet TAKE ONE TABLET BY MOUTH TWICE DAILY  . warfarin (COUMADIN) 4 MG tablet 4 mg-Friday, Saturday, Sunday, Monday, Wednesday  2 mg-Tuesday/Thursday  . [DISCONTINUED] amLODipine (NORVASC) 2.5 MG tablet Take 1 tablet (2.5 mg total) by mouth daily.     Allergies:   Metformin and related   Social History   Social History  . Marital status: Single    Spouse name: N/A  . Number of children: 0  . Years of education: N/A   Occupational History  . Retired    Social History Main Topics  . Smoking status: Former Smoker    Packs/day: 1.00    Years: 30.00    Types: Cigarettes    Quit date: 05/10/1994  . Smokeless tobacco: Never Used  . Alcohol use 1.2 oz/week    2 Shots of liquor per week     Comment: occasionally.  . Drug use: No  . Sexual activity: No   Other Topics Concern  . None   Social History Narrative   Lives alone.       Family History  Problem Relation Age of Onset  . Cancer Mother   . Ulcers Father 48  . Heart attack Brother 1     ROS:   Please see the history of present illness.    ROS All other systems reviewed and are negative.   EKGs/Labs/Other Test  Reviewed:    EKG:  EKG is  ordered today.  The ekg ordered today demonstrates NSR, rightward axis, HR 73, RBBB, QTc 495 ms, no significant changes.   Recent Labs: 05/20/2016: TSH 2.691 09/01/2016: Magnesium 1.9 09/02/2016: ALT 16; BUN 13; Creatinine, Ser 1.01; Hemoglobin 11.6; Platelets 158; Potassium 3.5; Sodium 138   Recent Lipid Panel    Component Value Date/Time   CHOL 122 05/20/2016 0101   TRIG 70 05/20/2016 0101   HDL 48 05/20/2016 0101   CHOLHDL 2.5 05/20/2016 0101   VLDL 14 05/20/2016 0101   LDLCALC 60 05/20/2016 0101     Physical Exam:    VS:  Ht 5\' 9"  (1.753 m)   Wt 150 lb 1.9 oz (68.1 kg)   BMI 22.17 kg/m     Wt Readings from Last 3 Encounters:  09/08/16 150 lb 1.9 oz (68.1 kg)  08/31/16 193 lb 11.2 oz (87.9 kg)  08/03/16 153 lb (69.4 kg)     Physical Exam  Constitutional: He is oriented to person, place, and time. He appears well-developed and well-nourished. No distress.  HENT:  Head: Normocephalic and atraumatic.  Eyes: No scleral icterus.  Neck: Normal range of motion. No JVD present.  Cardiovascular: Normal rate, regular rhythm, S1 normal and S2 normal.   Murmur heard.  Low-pitched systolic murmur is present with a grade of 2/6  at the upper right sternal border Pulmonary/Chest: Effort normal and breath sounds normal. He has no wheezes. He has no rhonchi. He has no rales.  Abdominal: Soft. There is no tenderness.  Musculoskeletal: He exhibits no edema.  Neurological: He is alert and oriented to person, place, and time.  Skin: Skin is warm and dry.  Psychiatric: He has a normal mood and affect.    ASSESSMENT:    1. Coronary artery disease involving native coronary artery of native heart with angina pectoris ([place])   2. Chronic diastolic heart failure (HCC)   3. Paroxysmal atrial fibrillation ([place])   4. Essential hypertension   5. Hyperlipidemia, unspecified hyperlipidemia  type   6. Non-insulin dependent type 2 diabetes mellitus ([place])    PLAN:     In order of problems listed above:  1. Coronary artery disease involving native coronary artery of native heart with angina pectoris ([place]) -  He has a hx of prior LAD stenting.  He has been admitted several times with chest pain.  He has chronic angina.  LHC in 1/18 demonstrated patent LAD stent, oRCA 50% and D2 80% stenosis that was unchanged.  Myoview during recent admit with inf-lat infarct but no ischemia. He had more chest pain last night and took NTG.  He has some more vague chest pain today.  His ECG is unchanged.  Given his recent encouraging LHC and non-ischemic myoview, he does not need further testing.  As his BP is quite high today, he needs advancement in his medical regimen.  Although he has not taken medications today, I believe his BP can tolerate an increase in his Amlodipine.  -  No ASA as he is on Coumadin and hx of GI bleed  -  Continue statin, beta-blocker, nitrates, ranolazine.  -  Increase Amlodipine to 5 mg QD  -  Close FU in 2-3 weeks.  2. Chronic diastolic heart failure (HCC) - Volume is stable.  He is not currently on diuretics.   3. Paroxysmal atrial fibrillation (HCC) - Maintaining NSR.  Continue Coumadin.  4. Essential hypertension - BP uncontrolled.  Increase dose of Amlodipine as noted.   5. Hyperlipidemia, unspecified hyperlipidemia type - Continue statin. LDL in 1/18 was 60.  6. Non-insulin dependent type 2 diabetes mellitus ([place]) - FU with PCP.    Dispo:  Return in about 3 weeks (around 09/29/2016) for Close Follow Up, w/ Dr. [name] or Richardson Dopp, PA-C.   Medication Adjustments/Labs and Tests Ordered: Current medicines are reviewed at length with the patient today.  Concerns regarding medicines are outlined above.  Orders placed this visit:  Orders Placed This Encounter  Procedures  . EKG 12-Lead   Medication changes this visit: Meds ordered this encounter  Medications  . amLODipine (NORVASC) 5 MG tablet    Sig: Take 1 tablet (5 mg total)  by mouth daily.    Dispense:  90 tablet    Refill:  3    Signed, Richardson Dopp, PA-C  09/08/2016 7:57 PM    [facility] [address], [place], [place]  [address] Phone: [phone]; Fax: [phone]

## 2016-09-08 NOTE — Patient Instructions (Addendum)
Medication Instructions:  1. INCREASE AMLODIPINE TO 5 MG DAILY; NEW RX HAS BEEN SENT IN FOR THE 5 MG TABLET  Labwork: NONE ORDERED  Testing/Procedures: NONE ORDERED  Follow-Up: 10/01/16 @ 10:15 WITH SCOT WEAVER, PAC   Any Other Special Instructions Will Be Listed Below (If Applicable).  If you need a refill on your cardiac medications before your next appointment, please call your pharmacy.

## 2016-09-13 ENCOUNTER — Other Ambulatory Visit: Payer: Self-pay | Admitting: Cardiovascular Disease

## 2016-10-01 ENCOUNTER — Telehealth: Payer: Self-pay | Admitting: *Deleted

## 2016-10-01 ENCOUNTER — Encounter: Payer: Self-pay | Admitting: Physician Assistant

## 2016-10-01 ENCOUNTER — Encounter (INDEPENDENT_AMBULATORY_CARE_PROVIDER_SITE_OTHER): Payer: Self-pay

## 2016-10-01 ENCOUNTER — Ambulatory Visit (INDEPENDENT_AMBULATORY_CARE_PROVIDER_SITE_OTHER): Payer: Medicare Other | Admitting: Physician Assistant

## 2016-10-01 VITALS — BP 120/60 | HR 75 | Ht 69.0 in | Wt 151.1 lb

## 2016-10-01 DIAGNOSIS — I480 Paroxysmal atrial fibrillation: Secondary | ICD-10-CM | POA: Diagnosis not present

## 2016-10-01 DIAGNOSIS — I25119 Atherosclerotic heart disease of native coronary artery with unspecified angina pectoris: Secondary | ICD-10-CM | POA: Diagnosis not present

## 2016-10-01 DIAGNOSIS — I10 Essential (primary) hypertension: Secondary | ICD-10-CM | POA: Diagnosis not present

## 2016-10-01 DIAGNOSIS — I350 Nonrheumatic aortic (valve) stenosis: Secondary | ICD-10-CM | POA: Diagnosis not present

## 2016-10-01 DIAGNOSIS — I5032 Chronic diastolic (congestive) heart failure: Secondary | ICD-10-CM

## 2016-10-01 DIAGNOSIS — I493 Ventricular premature depolarization: Secondary | ICD-10-CM | POA: Diagnosis not present

## 2016-10-01 DIAGNOSIS — R5383 Other fatigue: Secondary | ICD-10-CM

## 2016-10-01 MED ORDER — CARVEDILOL 12.5 MG PO TABS: 18.7500 mg | ORAL_TABLET | Freq: Two times a day (BID) | ORAL | 3 refills | Status: DC

## 2016-10-01 NOTE — Patient Instructions (Addendum)
Medication Instructions:  INCREASE COREG TO 12.5 MG TABLET WITH THE DIRECTIONS TO TAKE : 1 AND 1/2 TABLETS TWICE DAILY = 18.75 MG  TWICE DAILY  Labwork: 1. TODAY BMET, MAGNESIUM LEVEL, CBC  Testing/Procedures: 1. Your physician has requested that you have an echocardiogram. Echocardiography is a painless test that uses sound waves to create images of your heart. It provides your doctor with information about the size and shape of your heart and how well your heart's chambers and valves are working. This procedure takes approximately one hour. There are no restrictions for this procedure.  2. Your physician has recommended that you wear a 24 HOUR holter monitor. Holter monitors are medical devices that record the heart's electrical activity. Doctors most often use these monitors to diagnose arrhythmias. Arrhythmias are problems with the speed or rhythm of the heartbeat. The monitor is a small, portable device. You can wear one while you do your normal daily activities. This is usually used to diagnose what is causing palpitations/syncope (passing out).    Follow-Up: DR. [name] IN 6-8 WEEKS  Any Other Special Instructions Will Be Listed Below (If Applicable).     If you need a refill on your cardiac medications before your next appointment, please call your pharmacy.

## 2016-10-01 NOTE — Telephone Encounter (Signed)
-----   Message from Liliane Shi, [place] sent at 10/01/2016  4:52 PM EDT ----- Please call the patient Kidney function is stable. Glucose is high.  The potassium and magnesium are normal. CBC is pending. Continue with current treatment plan. Richardson Dopp, PA-C   10/01/2016 4:52 PM

## 2016-10-01 NOTE — Progress Notes (Signed)
Cardiology Office Note:    Date:  10/01/2016   ID:  [name], DOB 02-21-1924, MRN [id number]  PCP:  Lavone Orn, MD  Cardiologist:  Dr. Liam Rogers    Referring MD: Lavone Orn, MD   Chief Complaint  Patient presents with  . Chest Pain    follow up    History of Present Illness:    [name] is a 83 y.o. male with a hx of CAD s/p prior PCI to the LAD in 2012, chronic PVCs/PACs, HTN, diabetes, HL, systolic CHF, PAF (failed TEE-DCCV), prior lower GI bleed, metastatic colon CA. LHC in 2015 demonstrated patent stent. [name] been managed with antianginal therapy for chest discomfort. He was admitted 6/17 with chest pain and near syncope with a blood pressure of 80/40. Beta blocker and nitrate dose were both adjusted. Echocardiogram demonstrated normal LV function. Patient was admitted in 1/18 with chest pain. LHC demonstrated patent LAD stent and stable anatomy. Medical therapy was continued. He was admitted again in 3/18 with chest pain. Medical therapy was continued.  He was admitted again in 08/2016 with minimally elevated troponin levels in the setting of chest pain. Inpatient nuclear study demonstrated inferolateral infarct but no ischemia. Medical therapy was continued.  CHADS2-VASc= 6 (age, HTN, CHF, vascular disease, diabetes).   I saw him in follow-up 09/08/16. He continued complaints of chest discomfort. His blood pressure was uncontrolled. I placed him on amlodipine. He returns for close follow-up.  Mr. [name] returns for follow up.  He is here alone.  He continues to feel poorly.  He states he feels "yucky."  He has chronic chest pain that seems to last all day long.  He does not note any worsening with activity.  He denies syncope or near syncope.  He denies orthopnea, PND, edema.  He denies any bleeding issues.  He denies fevers, hemoptysis, vomiting, diarrhea.    Prior CV studies:   The following studies were reviewed today:  Myoview 09/01/16 EF 48, PVCs,  inferolateral infarct, no ischemia; intermediate risk  LHC 05/20/16 LAD proximal stent patent, D2 80 RCA ostial 50 Normal EF  Echo 10/17/46 Normal systolic function, grade 1 diastolic dysfunction, mild aortic stenosis (mean 13, peak 22), RA band cor triatriatum  Carotid US 11/04/15 Bilateral - 1% to 39% ICA stenosis. Vertebral artery flow was antegrade.  LHC 4/15 LM ok  LAD patent. Mid and dist 30, D1 80 prox LCx < 20 PDA ostial 60 EF 55-65  Myoview 8/14 No ischemia, EF 47  Past Medical History:  Diagnosis Date  . Aortic stenosis    a. Mild-mod by echo 2013.  . Barrett esophagus   . Chronic anticoagulation   . Chronic systolic CHF (congestive heart failure) (HCC)    a. EF previously 35-40%. b. improved to 55-65% by cath 08/2013.  [name] Colon cancer ([place])    a. 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 b.  "graduated" from their practice 06/2013, remains free of any new disease per those notes.  . Coronary artery disease    a. s/p PCI w/ DES to mLAD 08/05/10. b. NSTEMI 08/2013 (mildly elev trop) - cath showing widely patent stent, 80% prox D1 (small vessel) with recommendation for medical management, residual 60% ostial PDA, <20% LCx, LVEF 55-65%.   . Depression   . Gallstone pancreatitis 2011   a. s/p chole.  [name] GERD (gastroesophageal reflux disease)   . Hyperlipidemia   . Hypertension   . Macrocytic anemia 06/25/2011  . Myelodysplastic  disease ([place])    a. Suspected low-grade  myelodysplastic disease per onc notes.  . Other pancytopenia ([place]) 06/18/2014   Mild, fluctuating, likely med related. Rule out early MDS from prior chemo/RT or colon cancer  . PAF (paroxysmal atrial fibrillation) ([place])    a. failed TEE due to inability to pass probe into the esophagus in March 2013. b. On Coumadin. Was in NSR 08/2013 admission.  . Premature atrial contractions   . Premature ventricular contractions   . SBO (small bowel obstruction) ([place]) 2005  . Type II diabetes mellitus ([place])      Past Surgical History:  Procedure Laterality Date  . ABDOMINAL MASS RESECTION     Mass near mesentery  . APPENDECTOMY     "took out during colon surgery"  . CARDIAC CATHETERIZATION  08/03/10  . CARDIAC CATHETERIZATION  08/09/10   LAD 30, stent OK, CFX 40, RCA < 20  . CARDIAC CATHETERIZATION N/A 05/20/2016   Procedure: Left Heart Cath and Coronary Angiography;  Surgeon: Lorretta Harp, MD;  Location: [facility];  Service: Cardiovascular;  Laterality: N/A;  . CATARACT EXTRACTION Bilateral   . CHOLECYSTECTOMY  2011  . COLON SURGERY    . CORONARY ANGIOPLASTY WITH STENT PLACEMENT  08/05/10   DES to the LAD  . CYSTOSCOPY     "with removal of kidney stone in office"  . CYSTOSCOPY WITH RETROGRADE PYELOGRAM, URETEROSCOPY AND STENT PLACEMENT Bilateral 05/18/2013   Procedure: CYSTOSCOPY WITH BILATERAL RETROGRADE PYELOGRAM, LEFT URETEROSCOPY AND LEFT STENT PLACEMENT;  Surgeon: Alexis Frock, MD;  Location: [facility];  Service: Urology;  Laterality: Bilateral;  . LEFT HEART CATHETERIZATION WITH CORONARY ANGIOGRAM N/A 08/15/2013   Procedure: LEFT HEART CATHETERIZATION WITH CORONARY ANGIOGRAM;  Surgeon: Peter M Martinique, MD;  Location: [facility];  Service: Cardiovascular;  Laterality: N/A;  . RIGHT COLECTOMY    . TONSILLECTOMY      Current Medications: Current Meds  Medication Sig  . amLODipine (NORVASC) 5 MG tablet Take 1 tablet (5 mg total) by mouth daily.  [name] atorvastatin (LIPITOR) 40 MG tablet TAKE ONE TABLET BY MOUTH ONCE DAILY AT 6 PM  . B Complex-C (B-COMPLEX WITH VITAMIN C) tablet Take 1 tablet by mouth daily.  . Cholecalciferol (VITAMIN D-3 PO) Take 1 tablet by mouth daily.   [name] donepezil (ARICEPT) 10 MG tablet Take 10 mg by mouth at bedtime.   . finasteride (PROSCAR) 5 MG tablet Take 5 mg by mouth at bedtime.   [name] glimepiride (AMARYL) 1 MG tablet Take 1 mg by mouth daily.   . isosorbide mononitrate (IMDUR) 120 MG 24 hr tablet Take 1 tablet (120 mg total) by mouth daily.  [name]  linagliptin (TRADJENTA) 5 MG TABS tablet Take 1 tablet (5 mg total) by mouth daily. For blood sugar  . lisinopril (PRINIVIL,ZESTRIL) 2.5 MG tablet Take 1 tablet (2.5 mg total) by mouth daily.  . mupirocin ointment (BACTROBAN) 2 % Apply 1 application topically daily as needed (to affected area on forehead).   . nitroGLYCERIN (NITROSTAT) 0.4 MG SL tablet PLACE ONE TABLET UNDER THE TONGUE EVERY 5 MINUTES AS NEEDED FOR CHEST PAIN.  [name] pantoprazole (PROTONIX) 40 MG tablet Take 1 tablet (40 mg total) by mouth daily.  [name] Propylene Glycol (SYSTANE BALANCE) 0.6 % SOLN Apply 1-2 drops to eye 3 (three) times daily as needed (for dry eyes).  . RANEXA 1000 MG SR tablet TAKE ONE TABLET BY MOUTH TWICE DAILY  . warfarin (COUMADIN) 4 MG tablet 4 mg-Friday, Saturday, Sunday,  Monday, Wednesday  2 mg-Tuesday/Thursday  . [DISCONTINUED] carvedilol (COREG) 25 MG tablet Take 0.5 tablets (12.5 mg total) by mouth 2 (two) times daily with a meal.     Allergies:   Metformin and related   Social History   Social History  . Marital status: Single    Spouse name: N/A  . Number of children: 0  . Years of education: N/A   Occupational History  . Retired    Social History Main Topics  . Smoking status: Former Smoker    Packs/day: 1.00    Years: 30.00    Types: Cigarettes    Quit date: 05/10/1994  . Smokeless tobacco: Never Used  . Alcohol use 1.2 oz/week    2 Shots of liquor per week     Comment: occasionally.  . Drug use: No  . Sexual activity: No   Other Topics Concern  . None   Social History Narrative   Lives alone.       Family Hx: The patient's family history includes Cancer in his mother; Heart attack (age of onset: 63) in his brother; Ulcers (age of onset: 55) in his father.  ROS:   Please see the history of present illness.    Review of Systems  Cardiovascular: Positive for dyspnea on exertion.   All other systems reviewed and are negative.   EKGs/Labs/Other Test Reviewed:    EKG:  EKG  is  ordered today.  The ekg ordered today demonstrates NSR, HR 75, PACs, PVCs, RBBB  Recent Labs: 05/20/2016: TSH 2.691 09/01/2016: Magnesium 1.9 09/02/2016: ALT 16; BUN 13; Creatinine, Ser 1.01; Hemoglobin 11.6; Platelets 158; Potassium 3.5; Sodium 138   Recent Lipid Panel    Component Value Date/Time   CHOL 122 05/20/2016 0101   TRIG 70 05/20/2016 0101   HDL 48 05/20/2016 0101   CHOLHDL 2.5 05/20/2016 0101   VLDL 14 05/20/2016 0101   LDLCALC 60 05/20/2016 0101     Physical Exam:    VS:  BP 120/60   Pulse 75   Ht 5\' 9"  (1.753 m)   Wt 151 lb 1.9 oz (68.5 kg)   SpO2 94%   BMI 22.32 kg/m     Wt Readings from Last 3 Encounters:  10/01/16 151 lb 1.9 oz (68.5 kg)  09/08/16 150 lb 1.9 oz (68.1 kg)  08/31/16 193 lb 11.2 oz (87.9 kg)     Physical Exam  Constitutional: He is oriented to person, place, and time. He appears well-developed and well-nourished. No distress.  HENT:  Head: Normocephalic and atraumatic.  Eyes: No scleral icterus.  Neck: Normal range of motion. No JVD present.  Cardiovascular: Normal rate, regular rhythm, S1 normal and S2 normal.   Murmur heard.  Harsh crescendo-decrescendo systolic murmur is present with a grade of 2/6  Pulmonary/Chest: Effort normal and breath sounds normal. He has no wheezes. He has no rhonchi. He has no rales.  Abdominal: Soft. There is no tenderness.  Musculoskeletal: He exhibits no edema.  Neurological: He is alert and oriented to person, place, and time.  Skin: Skin is warm and dry.  Psychiatric: He has a normal mood and affect.    ASSESSMENT:    1. Coronary artery disease involving native coronary artery of native heart with angina pectoris ([place])   2. PVC's (premature ventricular contractions)   3. Other fatigue   4. Paroxysmal atrial fibrillation (HCC)   5. Chronic diastolic heart failure ([place])   6. Essential hypertension   7. Aortic valve stenosis, etiology  of cardiac valve disease unspecified    PLAN:    In order  of problems listed above:  1. Coronary artery disease involving native coronary artery of native heart with angina pectoris ([place]) -  S/p prior LAD stenting.  LHC in 1/18 demonstrated patent LAD stent, oRCA 50% and D2 80% stenosis that was unchanged.  Myoview recently with inf-lat infarct but no ischemia.  He has had chronic chest pain.  He also notes fatigue.  I am not convinced his symptoms are all related to angina.  Question if his aortic stenosis has worsened.  Also, question if he is symptomatic from the PVCs.  It is possible he has developed a cardiomyopathy related to PVCs. Also, he has a hx of GI bleed and is on Coumadin for PAF.  I reviewed his case with Dr. Liam Rogers. We will try to advance his medical Rx further.  With recent LHC and Nuclear stress test, he does not require further ischemic testing.   -  Continue Amlodipine, statin, nitrates, Ranolazine  -  He is not on ASA as he is on Coumadin.   -  Increase Carvedilol to 18.75 mg bid   2. PVC's (premature ventricular contractions) -  He has several PVCs on ECG.  Question if he has developed PVC induced cardiomyopathy.  We need to assess his electrolytes as well.    -  BMET, Mg2+ today  -  Obtain follow up echocardiogram to recheck LVEF.  -  Get 24 Hr Holter to assess PVC burden >> If very high +/- depressed LVF, consider referral to EP  3. Other fatigue -  Etiology not clear.  Question if related to PVCs.  Obtain BMET, Mg2+, CBC today.  Arrange echocardiogram.   4. Paroxysmal atrial fibrillation (HCC) - Maintaining NSR.  He has a hx of GI bleed.  He is on Coumadin and this is managed by his PCP.  I will get a CBC today to check his hemoglobin.    5. Chronic diastolic heart failure (HCC) - Volume stable.  He is not on diuretics.    6. Essential hypertension  BP controlled.  7. Aortic stenosis - Obtain Echo to recheck ejection fraction and assess for worsening aortic stenosis.   Dispo:  Return in about 8 weeks (around  11/26/2016) for Close Follow Up, w/ Dr. [name].   Medication Adjustments/Labs and Tests Ordered: Current medicines are reviewed at length with the patient today.  Concerns regarding medicines are outlined above.  Orders/Tests:  Orders Placed This Encounter  Procedures  . Basic Metabolic Panel (BMET)  . Magnesium  . CBC  . Holter monitor - 24 hour  . EKG 12-Lead  . ECHOCARDIOGRAM COMPLETE   Medication changes: Meds ordered this encounter  Medications  . carvedilol (COREG) 12.5 MG tablet    Sig: Take 1.5 tablets (18.75 mg total) by mouth 2 (two) times daily.    Dispense:  270 tablet    Refill:  3   Signed, Richardson Dopp, PA-C  10/01/2016 [facility] [place], [place], [place]  [address] Phone: [phone]; Fax: [phone]

## 2016-10-01 NOTE — Telephone Encounter (Signed)
Pt notified of lab results by phone with verbal understanding. Pt aware CBC results not in yet, once results are in I will call with those results. Pt thanked me for my call tonight. Pt advised to keep an eye on his blood sugars, glucose in today's lab work 209.

## 2016-10-02 LAB — BASIC METABOLIC PANEL
BUN / CREAT RATIO: 18 (ref 10–24)
BUN: 21 mg/dL (ref 10–36)
CHLORIDE: 98 mmol/L (ref 96–106)
CO2: 22 mmol/L (ref 18–29)
Calcium: 9 mg/dL (ref 8.6–10.2)
Creatinine, Ser: 1.2 mg/dL (ref 0.76–1.27)
GFR calc Af Amer: 61 mL/min/{1.73_m2} (ref >59)
GFR calc non Af Amer: 53 mL/min/{1.73_m2} — ABNORMAL LOW (ref >59)
GLUCOSE: 209 mg/dL — AB (ref 65–99)
Potassium: 4.5 mmol/L (ref 3.5–5.2)
SODIUM: 135 mmol/L (ref 134–144)

## 2016-10-02 LAB — CBC
Hematocrit: 32.8 % — ABNORMAL LOW (ref 37.5–51.0)
Hemoglobin: 11 g/dL — ABNORMAL LOW (ref 13.0–17.7)
MCH: 35.3 pg — AB (ref 26.6–33.0)
MCHC: 33.5 g/dL (ref 31.5–35.7)
MCV: 105 fL — AB (ref 79–97)
PLATELETS: 198 10*3/uL (ref 150–379)
RBC: 3.12 x10E6/uL — ABNORMAL LOW (ref 4.14–5.80)
RDW: 13.7 % (ref 12.3–15.4)
WBC: 6.9 10*3/uL (ref 3.4–10.8)

## 2016-10-02 LAB — MAGNESIUM: MAGNESIUM: 1.9 mg/dL (ref 1.6–2.3)

## 2016-10-05 ENCOUNTER — Telehealth: Payer: Self-pay | Admitting: *Deleted

## 2016-10-05 NOTE — Telephone Encounter (Signed)
-----   Message from Liliane Shi, [place] sent at 10/04/2016  5:06 PM EDT ----- Please call the patient The hemoglobin is stable. Continue with current treatment plan. Richardson Dopp, PA-C   10/04/2016 5:06 PM

## 2016-10-05 NOTE — Telephone Encounter (Signed)
Pt notified of cbc lab results by phone with verbal understanding. Pt thanked me for my call.

## 2016-10-06 ENCOUNTER — Encounter (HOSPITAL_COMMUNITY): Payer: Self-pay | Admitting: Emergency Medicine

## 2016-10-06 ENCOUNTER — Emergency Department (HOSPITAL_COMMUNITY): Payer: Medicare Other

## 2016-10-06 ENCOUNTER — Observation Stay (HOSPITAL_COMMUNITY)
Admission: EM | Admit: 2016-10-06 | Discharge: 2016-10-09 | Disposition: A | Discharge status: Home / Self Care | Payer: Medicare Other | Attending: Internal Medicine | Admitting: Internal Medicine

## 2016-10-06 DIAGNOSIS — Z809 Family history of malignant neoplasm, unspecified: Secondary | ICD-10-CM | POA: Insufficient documentation

## 2016-10-06 DIAGNOSIS — K219 Gastro-esophageal reflux disease without esophagitis: Secondary | ICD-10-CM | POA: Insufficient documentation

## 2016-10-06 DIAGNOSIS — Z9841 Cataract extraction status, right eye: Secondary | ICD-10-CM | POA: Insufficient documentation

## 2016-10-06 DIAGNOSIS — R079 Chest pain, unspecified: Secondary | ICD-10-CM | POA: Insufficient documentation

## 2016-10-06 DIAGNOSIS — I110 Hypertensive heart disease with heart failure: Secondary | ICD-10-CM | POA: Diagnosis not present

## 2016-10-06 DIAGNOSIS — Z79899 Other long term (current) drug therapy: Secondary | ICD-10-CM | POA: Insufficient documentation

## 2016-10-06 DIAGNOSIS — I252 Old myocardial infarction: Secondary | ICD-10-CM | POA: Insufficient documentation

## 2016-10-06 DIAGNOSIS — I5032 Chronic diastolic (congestive) heart failure: Secondary | ICD-10-CM | POA: Diagnosis not present

## 2016-10-06 DIAGNOSIS — E785 Hyperlipidemia, unspecified: Secondary | ICD-10-CM | POA: Insufficient documentation

## 2016-10-06 DIAGNOSIS — E119 Type 2 diabetes mellitus without complications: Secondary | ICD-10-CM | POA: Insufficient documentation

## 2016-10-06 DIAGNOSIS — Z955 Presence of coronary angioplasty implant and graft: Secondary | ICD-10-CM | POA: Insufficient documentation

## 2016-10-06 DIAGNOSIS — D649 Anemia, unspecified: Secondary | ICD-10-CM | POA: Diagnosis present

## 2016-10-06 DIAGNOSIS — C772 Secondary and unspecified malignant neoplasm of intra-abdominal lymph nodes: Secondary | ICD-10-CM | POA: Insufficient documentation

## 2016-10-06 DIAGNOSIS — Z888 Allergy status to other drugs, medicaments and biological substances status: Secondary | ICD-10-CM | POA: Insufficient documentation

## 2016-10-06 DIAGNOSIS — D61818 Other pancytopenia: Secondary | ICD-10-CM | POA: Diagnosis not present

## 2016-10-06 DIAGNOSIS — I493 Ventricular premature depolarization: Secondary | ICD-10-CM | POA: Insufficient documentation

## 2016-10-06 DIAGNOSIS — N179 Acute kidney failure, unspecified: Secondary | ICD-10-CM | POA: Diagnosis not present

## 2016-10-06 DIAGNOSIS — K2270 Barrett's esophagus without dysplasia: Secondary | ICD-10-CM | POA: Insufficient documentation

## 2016-10-06 DIAGNOSIS — C946 Myelodysplastic disease, not classified: Secondary | ICD-10-CM | POA: Insufficient documentation

## 2016-10-06 DIAGNOSIS — Z9842 Cataract extraction status, left eye: Secondary | ICD-10-CM | POA: Insufficient documentation

## 2016-10-06 DIAGNOSIS — R791 Abnormal coagulation profile: Secondary | ICD-10-CM

## 2016-10-06 DIAGNOSIS — I429 Cardiomyopathy, unspecified: Secondary | ICD-10-CM | POA: Insufficient documentation

## 2016-10-06 DIAGNOSIS — I350 Nonrheumatic aortic (valve) stenosis: Secondary | ICD-10-CM | POA: Diagnosis not present

## 2016-10-06 DIAGNOSIS — J849 Interstitial pulmonary disease, unspecified: Secondary | ICD-10-CM | POA: Insufficient documentation

## 2016-10-06 DIAGNOSIS — Z9049 Acquired absence of other specified parts of digestive tract: Secondary | ICD-10-CM | POA: Insufficient documentation

## 2016-10-06 DIAGNOSIS — D539 Nutritional anemia, unspecified: Secondary | ICD-10-CM | POA: Diagnosis not present

## 2016-10-06 DIAGNOSIS — K629 Disease of anus and rectum, unspecified: Secondary | ICD-10-CM | POA: Diagnosis not present

## 2016-10-06 DIAGNOSIS — D62 Acute posthemorrhagic anemia: Secondary | ICD-10-CM | POA: Insufficient documentation

## 2016-10-06 DIAGNOSIS — I5042 Chronic combined systolic (congestive) and diastolic (congestive) heart failure: Secondary | ICD-10-CM | POA: Diagnosis present

## 2016-10-06 DIAGNOSIS — C189 Malignant neoplasm of colon, unspecified: Secondary | ICD-10-CM | POA: Diagnosis not present

## 2016-10-06 DIAGNOSIS — Z794 Long term (current) use of insulin: Secondary | ICD-10-CM | POA: Insufficient documentation

## 2016-10-06 DIAGNOSIS — K648 Other hemorrhoids: Secondary | ICD-10-CM | POA: Insufficient documentation

## 2016-10-06 DIAGNOSIS — Z8249 Family history of ischemic heart disease and other diseases of the circulatory system: Secondary | ICD-10-CM | POA: Insufficient documentation

## 2016-10-06 DIAGNOSIS — I2510 Atherosclerotic heart disease of native coronary artery without angina pectoris: Secondary | ICD-10-CM | POA: Diagnosis present

## 2016-10-06 DIAGNOSIS — I10 Essential (primary) hypertension: Secondary | ICD-10-CM

## 2016-10-06 DIAGNOSIS — Z7901 Long term (current) use of anticoagulants: Secondary | ICD-10-CM | POA: Insufficient documentation

## 2016-10-06 DIAGNOSIS — K625 Hemorrhage of anus and rectum: Secondary | ICD-10-CM

## 2016-10-06 DIAGNOSIS — I480 Paroxysmal atrial fibrillation: Secondary | ICD-10-CM | POA: Insufficient documentation

## 2016-10-06 DIAGNOSIS — I700 Atherosclerosis of aorta: Secondary | ICD-10-CM | POA: Insufficient documentation

## 2016-10-06 DIAGNOSIS — I25119 Atherosclerotic heart disease of native coronary artery with unspecified angina pectoris: Secondary | ICD-10-CM | POA: Diagnosis not present

## 2016-10-06 DIAGNOSIS — Z87891 Personal history of nicotine dependence: Secondary | ICD-10-CM | POA: Insufficient documentation

## 2016-10-06 DIAGNOSIS — K921 Melena: Principal | ICD-10-CM | POA: Insufficient documentation

## 2016-10-06 DIAGNOSIS — I491 Atrial premature depolarization: Secondary | ICD-10-CM | POA: Insufficient documentation

## 2016-10-06 DIAGNOSIS — E440 Moderate protein-calorie malnutrition: Secondary | ICD-10-CM | POA: Insufficient documentation

## 2016-10-06 DIAGNOSIS — F329 Major depressive disorder, single episode, unspecified: Secondary | ICD-10-CM | POA: Insufficient documentation

## 2016-10-06 LAB — CBC WITH DIFFERENTIAL/PLATELET
Basophils Absolute: 0 10*3/uL (ref 0.0–0.1)
Basophils Relative: 0 %
Eosinophils Absolute: 0.1 10*3/uL (ref 0.0–0.7)
Eosinophils Relative: 1 %
HCT: 30.6 % — ABNORMAL LOW (ref 39.0–52.0)
Hemoglobin: 10.2 g/dL — ABNORMAL LOW (ref 13.0–17.0)
Lymphocytes Relative: 7 %
Lymphs Abs: 0.4 10*3/uL — ABNORMAL LOW (ref 0.7–4.0)
MCH: 35.1 pg — ABNORMAL HIGH (ref 26.0–34.0)
MCHC: 33.3 g/dL (ref 30.0–36.0)
MCV: 105.2 fL — ABNORMAL HIGH (ref 78.0–100.0)
Monocytes Absolute: 0.9 10*3/uL (ref 0.1–1.0)
Monocytes Relative: 15 %
Neutro Abs: 4.6 10*3/uL (ref 1.7–7.7)
Neutrophils Relative %: 77 %
Platelets: 174 10*3/uL (ref 150–400)
RBC: 2.91 MIL/uL — ABNORMAL LOW (ref 4.22–5.81)
RDW: 13.9 % (ref 11.5–15.5)
WBC: 6 10*3/uL (ref 4.0–10.5)

## 2016-10-06 LAB — BASIC METABOLIC PANEL
Anion gap: 8 (ref 5–15)
BUN: 31 mg/dL — ABNORMAL HIGH (ref 6–20)
CO2: 21 mmol/L — ABNORMAL LOW (ref 22–32)
Calcium: 8.7 mg/dL — ABNORMAL LOW (ref 8.9–10.3)
Chloride: 106 mmol/L (ref 101–111)
Creatinine, Ser: 1.35 mg/dL — ABNORMAL HIGH (ref 0.61–1.24)
GFR calc Af Amer: 51 mL/min — ABNORMAL LOW (ref >60)
GFR calc non Af Amer: 44 mL/min — ABNORMAL LOW (ref >60)
Glucose, Bld: 169 mg/dL — ABNORMAL HIGH (ref 65–99)
Potassium: 4.4 mmol/L (ref 3.5–5.1)
Sodium: 135 mmol/L (ref 135–145)

## 2016-10-06 LAB — I-STAT TROPONIN, ED: TROPONIN I, POC: 0.02 ng/mL (ref 0.00–0.08)

## 2016-10-06 LAB — PROTIME-INR
INR: 4.09
Prothrombin Time: 40.7 seconds — ABNORMAL HIGH (ref 11.4–15.2)

## 2016-10-06 LAB — POC OCCULT BLOOD, ED: Fecal Occult Bld: POSITIVE — AB

## 2016-10-06 LAB — GLUCOSE, CAPILLARY
Glucose-Capillary: 100 mg/dL — ABNORMAL HIGH (ref 65–99)
Glucose-Capillary: 253 mg/dL — ABNORMAL HIGH (ref 65–99)

## 2016-10-06 MED ORDER — SODIUM CHLORIDE 0.9 % IV SOLN: INTRAVENOUS | Status: AC

## 2016-10-06 MED ORDER — PANTOPRAZOLE SODIUM 40 MG PO TBEC
40.0000 mg | DELAYED_RELEASE_TABLET | Freq: Every day | ORAL | Status: DC
  Administered 2016-10-07 – 2016-10-09 (×3): 40 mg via ORAL
  Filled 2016-10-06 (×3): qty 1

## 2016-10-06 MED ORDER — INSULIN ASPART 100 UNIT/ML ~~LOC~~ SOLN
0.0000 [IU] | Freq: Three times a day (TID) | SUBCUTANEOUS | Status: DC
  Administered 2016-10-07: 3 [IU] via SUBCUTANEOUS
  Administered 2016-10-08: 2 [IU] via SUBCUTANEOUS
  Administered 2016-10-08: 3 [IU] via SUBCUTANEOUS
  Administered 2016-10-09: 2 [IU] via SUBCUTANEOUS

## 2016-10-06 MED ORDER — SODIUM CHLORIDE 0.9% FLUSH
3.0000 mL | Freq: Two times a day (BID) | INTRAVENOUS | Status: DC
  Administered 2016-10-07 – 2016-10-08 (×3): 3 mL via INTRAVENOUS

## 2016-10-06 MED ORDER — DONEPEZIL HCL 10 MG PO TABS
10.0000 mg | ORAL_TABLET | Freq: Every day | ORAL | Status: DC
  Administered 2016-10-06 – 2016-10-08 (×3): 10 mg via ORAL
  Filled 2016-10-06 (×3): qty 1

## 2016-10-06 MED ORDER — RANOLAZINE ER 500 MG PO TB12
1000.0000 mg | ORAL_TABLET | Freq: Two times a day (BID) | ORAL | Status: DC
  Administered 2016-10-06 – 2016-10-09 (×6): 1000 mg via ORAL
  Filled 2016-10-06 (×7): qty 2

## 2016-10-06 MED ORDER — RANOLAZINE ER 500 MG PO TB12
1000.0000 mg | ORAL_TABLET | Freq: Two times a day (BID) | ORAL | Status: DC
Start: 2016-10-06 — End: 2016-10-06

## 2016-10-06 MED ORDER — ONDANSETRON HCL 4 MG/2ML IJ SOLN: 4.0000 mg | Freq: Four times a day (QID) | INTRAMUSCULAR | Status: DC | PRN

## 2016-10-06 MED ORDER — SODIUM CHLORIDE 0.9 % IV BOLUS (SEPSIS)
1000.0000 mL | Freq: Once | INTRAVENOUS | Status: AC
Start: 2016-10-06 — End: 2016-10-06
  Administered 2016-10-06: 1000 mL via INTRAVENOUS

## 2016-10-06 MED ORDER — ONDANSETRON HCL 4 MG PO TABS
4.0000 mg | ORAL_TABLET | Freq: Four times a day (QID) | ORAL | Status: DC | PRN
Start: 2016-10-06 — End: 2016-10-09

## 2016-10-06 MED ORDER — FINASTERIDE 5 MG PO TABS
5.0000 mg | ORAL_TABLET | Freq: Every day | ORAL | Status: DC
  Administered 2016-10-06 – 2016-10-08 (×3): 5 mg via ORAL
  Filled 2016-10-06 (×3): qty 1

## 2016-10-06 MED ORDER — PHYTONADIONE 5 MG PO TABS
10.0000 mg | ORAL_TABLET | Freq: Once | ORAL | Status: AC
  Administered 2016-10-06: 10 mg via ORAL
  Filled 2016-10-06: qty 2

## 2016-10-06 NOTE — ED Notes (Signed)
Got patient into a gown on the monitor patient is resting with family at beside

## 2016-10-06 NOTE — H&P (Signed)
History and Physical    [name] ZDG:[id number] DOB: 06-20-1923 DOA: 10/06/2016  PCP: Lavone Orn, MD Patient coming from: home  Chief Complaint: BRBPR/chest pain  HPI: [name] is a very pleasant 83 y.o. male with medical history significant for CAD status post PCI 2012, chronic PVCs, hypertension, diabetes, hyperlipidemia, systolic heart failure, A. fib, GI bleed, metastatic colon cancer presents to the emergency Department chief complaint bright red blood per rectum and chest pain. Initial evaluation concerning for lower GI bleed in setting of hypercoagulpathy.   Information is obtained from the chart and the patient. He states over the last several weeks he's experienced intermittent chest pain. He states the pain is sharp intermittent located anterior chest. He has been hospitalized on several occasions over the last several months for same. [name] been treated medically. Associated symptoms include generalized weakness persistent fatigue yesterday he developed bright red blood per rectum. He denies headache but does endorse some dizziness. He denies syncope or near-syncope. He denies shortness of breath diaphoresis abdominal pain nausea vomiting. He denies diarrhea constipation dysuria hematuria frequency or urgency. Compliance with his medications. He reports this blood is bright red no clots and a very small amount. He has this bleeding with bowel movements only. He denies NSAID use for EtOH abuse.    ED Course: In the emergency department is afebrile hemodynamically stable and not hypoxic. He is provided with vitamin K and FFP  Review of Systems: As per HPI otherwise all other systems reviewed and are negative.   Ambulatory Status: He ambulates independently is independent with ADLs  Past Medical History:  Diagnosis Date  . Aortic stenosis    a. Mild-mod by echo 2013.  . Barrett esophagus   . Chronic anticoagulation   . Chronic systolic CHF (congestive heart  failure) (HCC)    a. EF previously 35-40%. b. improved to 55-65% by cath 08/2013.  [name] Colon cancer ([place])    a. 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 b.  "graduated" from their practice 06/2013, remains free of any new disease per those notes.  . Coronary artery disease    a. s/p PCI w/ DES to mLAD 08/05/10. b. NSTEMI 08/2013 (mildly elev trop) - cath showing widely patent stent, 80% prox D1 (small vessel) with recommendation for medical management, residual 60% ostial PDA, <20% LCx, LVEF 55-65%.   . Depression   . Gallstone pancreatitis 2011   a. s/p chole.  [name] GERD (gastroesophageal reflux disease)   . Hyperlipidemia   . Hypertension   . Macrocytic anemia 06/25/2011  . Myelodysplastic disease ([place])    a. Suspected low-grade  myelodysplastic disease per onc notes.  . Other pancytopenia ([place]) 06/18/2014   Mild, fluctuating, likely med related. Rule out early MDS from prior chemo/RT or colon cancer  . PAF (paroxysmal atrial fibrillation) ([place])    a. failed TEE due to inability to pass probe into the esophagus in March 2013. b. On Coumadin. Was in NSR 08/2013 admission.  . Premature atrial contractions   . Premature ventricular contractions   . SBO (small bowel obstruction) ([place]) 2005  . Type II diabetes mellitus ([place])     Past Surgical History:  Procedure Laterality Date  . ABDOMINAL MASS RESECTION     Mass near mesentery  . APPENDECTOMY     "took out during colon surgery"  . CARDIAC CATHETERIZATION  08/03/10  . CARDIAC CATHETERIZATION  08/09/10   LAD 30, stent OK, CFX 40, RCA < 20  . CARDIAC  CATHETERIZATION N/A 05/20/2016   Procedure: Left Heart Cath and Coronary Angiography;  Surgeon: Lorretta Harp, MD;  Location: [facility];  Service: Cardiovascular;  Laterality: N/A;  . CATARACT EXTRACTION Bilateral   . CHOLECYSTECTOMY  2011  . COLON SURGERY    . CORONARY ANGIOPLASTY WITH STENT PLACEMENT  08/05/10   DES to the LAD  . CYSTOSCOPY     "with removal of kidney stone  in office"  . CYSTOSCOPY WITH RETROGRADE PYELOGRAM, URETEROSCOPY AND STENT PLACEMENT Bilateral 05/18/2013   Procedure: CYSTOSCOPY WITH BILATERAL RETROGRADE PYELOGRAM, LEFT URETEROSCOPY AND LEFT STENT PLACEMENT;  Surgeon: Alexis Frock, MD;  Location: [facility];  Service: Urology;  Laterality: Bilateral;  . LEFT HEART CATHETERIZATION WITH CORONARY ANGIOGRAM N/A 08/15/2013   Procedure: LEFT HEART CATHETERIZATION WITH CORONARY ANGIOGRAM;  Surgeon: Peter M Martinique, MD;  Location: [facility] CATH LAB;  Service: Cardiovascular;  Laterality: N/A;  . RIGHT COLECTOMY    . TONSILLECTOMY      Social History   Social History  . Marital status: Single    Spouse name: N/A  . Number of children: 0  . Years of education: N/A   Occupational History  . Retired    Social History Main Topics  . Smoking status: Former Smoker    Packs/day: 1.00    Years: 30.00    Types: Cigarettes    Quit date: 05/10/1994  . Smokeless tobacco: Never Used  . Alcohol use 1.2 oz/week    2 Shots of liquor per week     Comment: occasionally.  . Drug use: No  . Sexual activity: No   Other Topics Concern  . Not on file   Social History Narrative   Lives alone.      Allergies  Allergen Reactions  . Metformin And Related Nausea And Vomiting    Family History  Problem Relation Age of Onset  . Cancer Mother   . Ulcers Father 61  . Heart attack Brother 18    Prior to Admission medications   Medication Sig Start Date End Date Taking? Authorizing Provider  atorvastatin (LIPITOR) 40 MG tablet TAKE ONE TABLET BY MOUTH ONCE DAILY AT 6 PM 09/13/16  Yes Nahser, Wonda Cheng, MD  B Complex-C (B-COMPLEX WITH VITAMIN C) tablet Take 1 tablet by mouth daily.   Yes [provider]  carvedilol (COREG) 12.5 MG tablet Take 1.5 tablets (18.75 mg total) by mouth 2 (two) times daily. Patient taking differently: Take 12.5 mg by mouth 2 (two) times daily.  10/01/16 12/30/16 Yes Weaver, Scott T, PA-C  Cholecalciferol (VITAMIN D-3 PO) Take 1  tablet by mouth daily.    Yes [provider]  donepezil (ARICEPT) 10 MG tablet Take 10 mg by mouth at bedtime.  05/04/16  Yes [provider]  finasteride (PROSCAR) 5 MG tablet Take 5 mg by mouth at bedtime.  04/01/14  Yes [provider]  glimepiride (AMARYL) 1 MG tablet Take 1 mg by mouth daily.  05/29/16  Yes [provider]  isosorbide mononitrate (IMDUR) 120 MG 24 hr tablet Take 1 tablet (120 mg total) by mouth daily. 07/20/16  Yes Reyne Dumas, MD  linagliptin (TRADJENTA) 5 MG TABS tablet Take 1 tablet (5 mg total) by mouth daily. For blood sugar 11/04/15  Yes Johnson, Clanford L, MD  lisinopril (PRINIVIL,ZESTRIL) 2.5 MG tablet Take 1 tablet (2.5 mg total) by mouth daily. 09/02/16  Yes Reyne Dumas, MD  mupirocin ointment (BACTROBAN) 2 % Apply 1 application topically daily as needed (to  affected area on forehead).  08/20/16  Yes [provider]  nitroGLYCERIN (NITROSTAT) 0.4 MG SL tablet PLACE ONE TABLET UNDER THE TONGUE EVERY 5 MINUTES AS NEEDED FOR CHEST PAIN. 08/16/16  Yes Nahser, Wonda Cheng, MD  pantoprazole (PROTONIX) 40 MG tablet Take 1 tablet (40 mg total) by mouth daily. 07/19/16  Yes Reyne Dumas, MD  Propylene Glycol (SYSTANE BALANCE) 0.6 % SOLN Apply 1-2 drops to eye 3 (three) times daily as needed (for dry eyes).   Yes [provider]  RANEXA 1000 MG SR tablet TAKE ONE TABLET BY MOUTH TWICE DAILY 08/20/16  Yes Nahser, Wonda Cheng, MD  warfarin (COUMADIN) 4 MG tablet 4 mg-Friday, Saturday, Sunday, Monday, Wednesday  2 mg-Tuesday/Thursday 09/02/16  Yes Reyne Dumas, MD    Physical Exam: Vitals:   10/06/16 1300 10/06/16 1315 10/06/16 1330 10/06/16 1345  BP: (!) 99/50 102/62 (!) 123/59 116/62  Pulse: 61 61 68 (!) 147  Resp: 19 19 (!) 22 (!) 22  Temp:      TempSrc:      SpO2: 96% 96% 90% 97%     General:  Appears calm and comfortable thin slighty pale Eyes:  PERRL, EOMI, normal lids, iris, no scleral icterus ENT:  grossly normal  hearing, lips & tongue, mucous membranes of his mouth are pink slightly dry Neck:  no LAD, masses or thyromegaly Cardiovascular:  RRR, no m/r/g. No LE edema.  Respiratory:  CTA bilaterally, no w/r/r. Normal respiratory effort. Abdomen:  soft, ntnd, positive bowel sounds but somewhat sluggish no guarding or rebounding Skin:  no rash or induration seen on limited exam Musculoskeletal:  grossly normal tone BUE/BLE, good ROM, no bony abnormality Psychiatric:  grossly normal mood and affect, speech fluent and appropriate, AOx3 Neurologic:  CN 2-12 grossly intact, moves all extremities in coordinated fashion, sensation intact  Labs on Admission: I have personally reviewed following labs and imaging studies  CBC:  Recent Labs Lab 10/01/16 1122 10/06/16 1229  WBC 6.9 6.0  NEUTROABS  --  4.6  HGB  --  10.2*  HCT 32.8* 30.6*  MCV 105* 105.2*  PLT 198 174   Basic Metabolic Panel:  Recent Labs Lab 10/01/16 1122 10/06/16 1229  NA 135 135  K 4.5 4.4  CL 98 106  CO2 22 21*  GLUCOSE 209* 169*  BUN 21 31*  CREATININE 1.20 1.35*  CALCIUM 9.0 8.7*  MG 1.9  --    GFR: Estimated Creatinine Clearance: 34.5 mL/min (A) (by C-G formula based on SCr of 1.35 mg/dL (H)). Liver Function Tests: No results for input(s): AST, ALT, ALKPHOS, BILITOT, PROT, ALBUMIN in the last 168 hours. No results for input(s): LIPASE, AMYLASE in the last 168 hours. No results for input(s): AMMONIA in the last 168 hours. Coagulation Profile:  Recent Labs Lab 10/06/16 1229  INR 4.09*   Cardiac Enzymes: No results for input(s): CKTOTAL, CKMB, CKMBINDEX, TROPONINI in the last 168 hours. BNP (last 3 results) No results for input(s): PROBNP in the last 8760 hours. HbA1C: No results for input(s): HGBA1C in the last 72 hours. CBG: No results for input(s): GLUCAP in the last 168 hours. Lipid Profile: No results for input(s): CHOL, HDL, LDLCALC, TRIG, CHOLHDL, LDLDIRECT in the last 72 hours. Thyroid Function  Tests: No results for input(s): TSH, T4TOTAL, FREET4, T3FREE, THYROIDAB in the last 72 hours. Anemia Panel: No results for input(s): VITAMINB12, FOLATE, FERRITIN, TIBC, IRON, RETICCTPCT in the last 72 hours. Urine analysis:    Component Value Date/Time   COLORURINE  AMBER (A) 08/31/2016 1746   APPEARANCEUR HAZY (A) 08/31/2016 1746   LABSPEC 1.025 08/31/2016 1746   PHURINE 5.0 08/31/2016 1746   GLUCOSEU NEGATIVE 08/31/2016 1746   HGBUR NEGATIVE 08/31/2016 1746   BILIRUBINUR NEGATIVE 08/31/2016 1746   KETONESUR NEGATIVE 08/31/2016 1746   PROTEINUR 100 (A) 08/31/2016 1746   UROBILINOGEN 0.2 06/07/2014 1924   NITRITE NEGATIVE 08/31/2016 1746   LEUKOCYTESUR NEGATIVE 08/31/2016 1746    Creatinine Clearance: Estimated Creatinine Clearance: 34.5 mL/min (A) (by C-G formula based on SCr of 1.35 mg/dL (H)).  Sepsis Labs: @LABRCNTIP (procalcitonin:4,lacticidven:4) )No results found for this or any previous visit (from the past 240 hour(s)).   Radiological Exams on Admission: Dg Chest 2 View  Result Date: 10/06/2016 CLINICAL DATA:  Chest tightness and weakness. No shortness of breath or cough. EXAM: CHEST  2 VIEW COMPARISON:  Two-view chest x-ray 08/31/2016 and 05/19/2016. FINDINGS: The heart size is normal. Atherosclerotic changes are again noted in the aortic arch. Chronic interstitial coarsening is again seen in both lungs. No superimposed airspace disease or edema is evident. There are no significant effusions. Lung volumes are slightly lower. Degenerative changes are again noted in the thoracic spine. IMPRESSION: 1. Stable chronic interstitial lung disease without superimposed airspace disease or edema. 2. Aortic atherosclerosis. Electronically Signed   By: San Morelle M.D.   On: 10/06/2016 12:16    EKG: Independently reviewed. 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 Bright red blood per rectum. Patient is on Coumadin with elevated INR. Last colonoscopy 2016 were small serrated adenoma was removed so has a history of diverticulosis. No episodes since presentation. Hemodynamically stable. Hemoglobin stable. Evaluated by gastroenterology in the emergency department who opined bleeding likely due to a rectal outlet source and recommend flexible sigmoidoscopy to evaluate for malignancy.  -Admit to telemetry  -Serial CBCs  -Clear liquid diet  -Nothing by mouth past midnight  -appreciate GI assistance. Not indicates plan for colonoscopy tomorrow with possible biopsies.   #2. Pain in the chest. This appears to be chronic. Initial troponin negative. EKG without acute changes. Discharge 4 weeks ago after an inpatient workup for chest pain. Jeanett Schlein that time had a stress test that was moderate risk no significant ischemia. Note indicates cardiology opines as he is 32 with multiple comorbidities recent cath January 2018 recommended medical management. -Cycle troponins -Serial EKG -Supportive therapy  #3. Acute kidney injury. Recent creatinine within the limits of normal. On admission today creatinine 1.35. Medications include lisinopril -Hold home meds for now -Gentle IV fluids -Monitor urine output -Recheck in the morning  #4. A. fib. On Coumadin. INR greater than 4. [place] score 4. EKG as noted above -Continue home beta blocker setting  tomorrow -Monitor -Hold Coumadin  #5. Elevated INR. Patient has been compliant with Coumadin. He received vitamin K fresh frozen plasma in the emergency department -Hold Coumadin -Recheck INR in am  #6. Diabetes.  [name] on oral agents. Serum glucose 169 on admission. -Hold oral agents for now -Obtain hemoglobin A1c -Sliding scale insulin for optimal control  #7. Chronic diastolic heart failure/cardiomyopathy. appears compensated. Echo done last year reveals grade 1 diastolic dysfunction. Home meds include beta blocker -We'll resume home medications when appropriate -Daily weights -Monitor intake and output  #8. Anemia macrocytic. Hx of same -serial cbc -fobt -OP follow up    DVT prophylaxis: scd Code Status: full  Family Communication: sister at bedside  Disposition Plan: home  Consults called: [facility]  Admission status: inpatient    Radene Gunning MD [facility]  If 7PM-7AM, please contact night-coverage [email or web address] Password [email or web address]  10/06/2016, 4:50 PM

## 2016-10-06 NOTE — ED Triage Notes (Signed)
Pt sts chest tightness with SOB and some bright red rectal bleeding x 1 month; pt sts takes coumadin

## 2016-10-06 NOTE — Consult Note (Signed)
Referring Provider: Dr. Marily Memos Primary Care Physician:  Lavone Orn, MD Primary Gastroenterologist:  Dr. Oletta Lamas  Reason for Consultation:  Rectal bleeding  HPI: [name] is a 83 y.o. male with metastatic colon cancer (mesenteric lymph nodes) reportedly in remission in 2015 who is on chronic Coumadin due to Afib. He has been having a one month history of frequent rectal bleeding that is bright red blood with defecation. BMs have been loose BID since onset of this bleeding. Denies rectal pain or abdominal pain. Last colonoscopy in 2016 where a small serrated adenoma was removed (Dr. Amedeo Plenty). Also has a history of diverticulosis. Denies any N/V/dizziness. Hgb 10.2. MCV 105. INR 4.09. Sister at bedside.  Past Medical History:  Diagnosis Date  . Aortic stenosis    a. Mild-mod by echo 2013.  . Barrett esophagus   . Chronic anticoagulation   . Chronic systolic CHF (congestive heart failure) (HCC)    a. EF previously 35-40%. b. improved to 55-65% by cath 08/2013.  [name] Colon cancer ([place])    a. 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 b.  "graduated" from their practice 06/2013, remains free of any new disease per those notes.  . Coronary artery disease    a. s/p PCI w/ DES to mLAD 08/05/10. b. NSTEMI 08/2013 (mildly elev trop) - cath showing widely patent stent, 80% prox D1 (small vessel) with recommendation for medical management, residual 60% ostial PDA, <20% LCx, LVEF 55-65%.   . Depression   . Gallstone pancreatitis 2011   a. s/p chole.  [name] GERD (gastroesophageal reflux disease)   . Hyperlipidemia   . Hypertension   . Macrocytic anemia 06/25/2011  . Myelodysplastic disease ([place])    a. Suspected low-grade  myelodysplastic disease per onc notes.  . Other pancytopenia ([place]) 06/18/2014   Mild, fluctuating, likely med related. Rule out early MDS from prior chemo/RT or colon cancer  . PAF (paroxysmal atrial fibrillation) ([place])    a. failed TEE due to inability to pass probe into the  esophagus in March 2013. b. On Coumadin. Was in NSR 08/2013 admission.  . Premature atrial contractions   . Premature ventricular contractions   . SBO (small bowel obstruction) ([place]) 2005  . Type II diabetes mellitus ([place])     Past Surgical History:  Procedure Laterality Date  . ABDOMINAL MASS RESECTION     Mass near mesentery  . APPENDECTOMY     "took out during colon surgery"  . CARDIAC CATHETERIZATION  08/03/10  . CARDIAC CATHETERIZATION  08/09/10   LAD 30, stent OK, CFX 40, RCA < 20  . CARDIAC CATHETERIZATION N/A 05/20/2016   Procedure: Left Heart Cath and Coronary Angiography;  Surgeon: Lorretta Harp, MD;  Location: [place] [facility];  Service: Cardiovascular;  Laterality: N/A;  . CATARACT EXTRACTION Bilateral   . CHOLECYSTECTOMY  2011  . COLON SURGERY    . CORONARY ANGIOPLASTY WITH STENT PLACEMENT  08/05/10   DES to the LAD  . CYSTOSCOPY     "with removal of kidney stone in office"  . CYSTOSCOPY WITH RETROGRADE PYELOGRAM, URETEROSCOPY AND STENT PLACEMENT Bilateral 05/18/2013   Procedure: CYSTOSCOPY WITH BILATERAL RETROGRADE PYELOGRAM, LEFT URETEROSCOPY AND LEFT STENT PLACEMENT;  Surgeon: Alexis Frock, MD;  Location: [facility];  Service: Urology;  Laterality: Bilateral;  . LEFT HEART CATHETERIZATION WITH CORONARY ANGIOGRAM N/A 08/15/2013   Procedure: LEFT HEART CATHETERIZATION WITH CORONARY ANGIOGRAM;  Surgeon: Peter M Martinique, MD;  Location: [facility];  Service: Cardiovascular;  Laterality: N/A;  .  RIGHT COLECTOMY    . TONSILLECTOMY      Prior to Admission medications   Medication Sig Start Date End Date Taking? Authorizing Provider  atorvastatin (LIPITOR) 40 MG tablet TAKE ONE TABLET BY MOUTH ONCE DAILY AT 6 PM 09/13/16  Yes Nahser, Wonda Cheng, MD  B Complex-C (B-COMPLEX WITH VITAMIN C) tablet Take 1 tablet by mouth daily.   Yes [provider]  carvedilol (COREG) 12.5 MG tablet Take 1.5 tablets (18.75 mg total) by mouth 2 (two) times daily. Patient taking differently:  Take 12.5 mg by mouth 2 (two) times daily.  10/01/16 12/30/16 Yes Weaver, Scott T, PA-C  Cholecalciferol (VITAMIN D-3 PO) Take 1 tablet by mouth daily.    Yes [provider]  donepezil (ARICEPT) 10 MG tablet Take 10 mg by mouth at bedtime.  05/04/16  Yes [provider]  finasteride (PROSCAR) 5 MG tablet Take 5 mg by mouth at bedtime.  04/01/14  Yes [provider]  glimepiride (AMARYL) 1 MG tablet Take 1 mg by mouth daily.  05/29/16  Yes [provider]  isosorbide mononitrate (IMDUR) 120 MG 24 hr tablet Take 1 tablet (120 mg total) by mouth daily. 07/20/16  Yes Reyne Dumas, MD  linagliptin (TRADJENTA) 5 MG TABS tablet Take 1 tablet (5 mg total) by mouth daily. For blood sugar 11/04/15  Yes Johnson, Clanford L, MD  lisinopril (PRINIVIL,ZESTRIL) 2.5 MG tablet Take 1 tablet (2.5 mg total) by mouth daily. 09/02/16  Yes Reyne Dumas, MD  mupirocin ointment (BACTROBAN) 2 % Apply 1 application topically daily as needed (to affected area on forehead).  08/20/16  Yes [provider]  nitroGLYCERIN (NITROSTAT) 0.4 MG SL tablet PLACE ONE TABLET UNDER THE TONGUE EVERY 5 MINUTES AS NEEDED FOR CHEST PAIN. 08/16/16  Yes Nahser, Wonda Cheng, MD  pantoprazole (PROTONIX) 40 MG tablet Take 1 tablet (40 mg total) by mouth daily. 07/19/16  Yes Reyne Dumas, MD  Propylene Glycol (SYSTANE BALANCE) 0.6 % SOLN Apply 1-2 drops to eye 3 (three) times daily as needed (for dry eyes).   Yes [provider]  RANEXA 1000 MG SR tablet TAKE ONE TABLET BY MOUTH TWICE DAILY 08/20/16  Yes Nahser, Wonda Cheng, MD  warfarin (COUMADIN) 4 MG tablet 4 mg-Friday, Saturday, Sunday, Monday, Wednesday  2 mg-Tuesday/Thursday 09/02/16  Yes Reyne Dumas, MD  amLODipine (NORVASC) 5 MG tablet Take 1 tablet (5 mg total) by mouth daily. Patient not taking: Reported on 10/06/2016 09/08/16 12/07/16  Richardson Dopp T, PA-C    Scheduled Meds: Continuous Infusions: PRN Meds:.  Allergies as of 10/06/2016 -  Review Complete 10/06/2016  Allergen Reaction Noted  . Metformin and related Nausea And Vomiting 03/05/2011    Family History  Problem Relation Age of Onset  . Cancer Mother   . Ulcers Father 4  . Heart attack Brother 32    Social History   Social History  . Marital status: Single    Spouse name: N/A  . Number of children: 0  . Years of education: N/A   Occupational History  . Retired    Social History Main Topics  . Smoking status: Former Smoker    Packs/day: 1.00    Years: 30.00    Types: Cigarettes    Quit date: 05/10/1994  . Smokeless tobacco: Never Used  . Alcohol use 1.2 oz/week    2 Shots of liquor per week     Comment: occasionally.  . Drug use: No  . Sexual activity: No   Other  Topics Concern  . Not on file   Social History Narrative   Lives alone.      Review of Systems: All negative except as stated above in HPI.  Physical Exam: Vital signs:  T 98.2; P 68, BP 123/59, R 22   General:   Elderly, Alert,  Thin, pleasant and cooperative in NAD HEENT: anicteric sclera, oropharynx clear Neck: supple, nontender Lungs:  Clear throughout to auscultation.   No wheezes, crackles, or rhonchi. No acute distress. Heart:  Regular rate and rhythm; no murmurs, clicks, rubs,  or gallops. Abdomen: soft, nontender, nondistended, +BS  Rectal:  Firmness in anorectum with tenderness, brown loose stool noted on DRE, tender DRE, no thrombosed internal hemorrhoids, no blood on gloved finger, no fissure seen or palpated Ext: no edema  GI:  Lab Results:  Recent Labs  10/06/16 1229  WBC 6.0  HGB 10.2*  HCT 30.6*  PLT 174   BMET  Recent Labs  10/06/16 1229  NA 135  K 4.4  CL 106  CO2 21*  GLUCOSE 169*  BUN 31*  CREATININE 1.35*  CALCIUM 8.7*   LFT No results for input(s): PROT, ALBUMIN, AST, ALT, ALKPHOS, BILITOT, BILIDIR, IBILI in the last 72 hours. PT/INR  Recent Labs  10/06/16 1229  LABPROT 40.7*  INR 4.09*     Studies/Results: Dg Chest 2  View  Result Date: 10/06/2016 CLINICAL DATA:  Chest tightness and weakness. No shortness of breath or cough. EXAM: CHEST  2 VIEW COMPARISON:  Two-view chest x-ray 08/31/2016 and 05/19/2016. FINDINGS: The heart size is normal. Atherosclerotic changes are again noted in the aortic arch. Chronic interstitial coarsening is again seen in both lungs. No superimposed airspace disease or edema is evident. There are no significant effusions. Lung volumes are slightly lower. Degenerative changes are again noted in the thoracic spine. IMPRESSION: 1. Stable chronic interstitial lung disease without superimposed airspace disease or edema. 2. Aortic atherosclerosis. Electronically Signed   By: San Morelle M.D.   On: 10/06/2016 12:16    Impression/Plan: 83 yo with rectal bleeding on Coumadin with a history of metastatic colon cancer (in remission as of 2015). Last surveillance colonoscopy in 2016 with a small adenoma removed. Bleeding likely due to a rectal outlet source and flex sig needed to evaluate for malignancy. Correct INR with FFP and Vit K by primary team in case biopsies of rectum needed during tomorrow morning's flex sig (scheduled for 815AM). Full liquid diet. NPO p MN.    LOS: 0 days   Green C.  10/06/2016, 4:09 PM  Pager [phone]  AFTER 5 pm or on weekends please call [phone]

## 2016-10-06 NOTE — ED Provider Notes (Signed)
[facility] DEPT Provider Note   CSN: [id number] Arrival date & time: 10/06/16  1044     History   Chief Complaint Chief Complaint  Patient presents with  . Chest Pain  . Rectal Bleeding    HPI [name] is a 83 y.o. male.  HPI   83 year old male with history of CAD s/pprior PCI to the LAD in 2012, chronic PVCs/PACs, HTN, diabetes, HL, systolic CHF, PAF (failed TEE-DCCV), prior lower GI bleed, metastatic colonCA presents today at the request of primary care physician for evaluation. Patient notes over the last 2 weeks she's had chest tightness on the left side. He notes this is gradually worsened. He denies any radiation of symptoms, denies any nausea or vomiting, abdominal pain, cough, shortness of breath, or fever. Patient notes that the symptoms are associated with mild shortness of breath, worse with ambulation, also notes worsening fatigue with ambulation. He reports that this is inconsistent at times he is able to ambulate normally, and other times he feels significantly exhausted. Patient notes around the same time he started to have bright red blood per rectum. He notes this is only associated with bowel movements, no bleed without movement. Patient denies any history of the same ( chart review so GI bleed in the past), denies any history of hemorrhoids. Patient denies any significant weight loss, night sweats, fevers. Patient has numerous recent hospital admissions for chest pain, most recently seen 5 days ago by cardiology. She was on medical management including amlodipine, statin, nitrates, ranolazine, Coumadin, and a recent increase in carvedilol.   Pt had colon cancer in 1992 and 1993. Recent colonoscopy in Jan 2016 only showed one single benign polyp.    I spoke with Dr. Laurann Montana on the phone who reports he saw patient in the office yesterday for repeat INR. He notes he was extremely fatigued in the office and had an episode of hypotension and responded with  oral hydration. He called the patient this morning reports he continued be fatigued and recommended hospital evaluation. He notes his INR yesterday was 3.1.   Past Medical History:  Diagnosis Date  . Aortic stenosis    a. Mild-mod by echo 2013.  . Barrett esophagus   . Chronic anticoagulation   . Chronic systolic CHF (congestive heart failure) (HCC)    a. EF previously 35-40%. b. improved to 55-65% by cath 08/2013.  [name] Colon cancer ([place])    a. 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 b.  "graduated" from their practice 06/2013, remains free of any new disease per those notes.  . Coronary artery disease    a. s/p PCI w/ DES to mLAD 08/05/10. b. NSTEMI 08/2013 (mildly elev trop) - cath showing widely patent stent, 80% prox D1 (small vessel) with recommendation for medical management, residual 60% ostial PDA, <20% LCx, LVEF 55-65%.   . Depression   . Gallstone pancreatitis 2011   a. s/p chole.  [name] GERD (gastroesophageal reflux disease)   . Hyperlipidemia   . Hypertension   . Macrocytic anemia 06/25/2011  . Myelodysplastic disease ([place])    a. Suspected low-grade  myelodysplastic disease per onc notes.  . Other pancytopenia ([place]) 06/18/2014   Mild, fluctuating, likely med related. Rule out early MDS from prior chemo/RT or colon cancer  . PAF (paroxysmal atrial fibrillation) ([place])    a. failed TEE due to inability to pass probe into the esophagus in March 2013. b. On Coumadin. Was in NSR 08/2013 admission.  . Premature atrial contractions   .  Premature ventricular contractions   . SBO (small bowel obstruction) ([place]) 2005  . Type II diabetes mellitus [facility])     Patient Active Problem List   Diagnosis Date Noted  . Acute kidney injury ([place]) 10/06/2016  . Barrett esophagus 07/17/2016  . Encounter for therapeutic drug monitoring 05/24/2016  . Malnutrition of moderate degree 05/20/2016  . Hypertensive heart disease without heart failure   . Chronic diastolic heart failure ([place])   . Near  syncope 11/03/2015  . Generalized weakness 11/03/2015  . Elevated INR 11/03/2015  . Hyperglycemia, unspecified 11/03/2015  . Anemia, unspecified 11/03/2015  . Chronic anticoagulation 11/03/2015  . Altered awareness, transient 11/03/2015  . Thrombocytopenia ([place]) 08/10/2015  . Non-insulin dependent type 2 diabetes mellitus ([place])   . Pain in the chest   . History of colon cancer, stage III 09/11/2014  . BRBPR (bright red blood per rectum) 09/11/2014  . Other pancytopenia ([place]) 06/18/2014  . History of non-ST elevation myocardial infarction (NSTEMI) 08/15/2013  . Chest pain 12/14/2012  . 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 Paroxysmal atrial fibrillation ([place]) 07/20/2011  . Cardiomyopathy ([place]) 07/20/2011  . SOB (shortness of breath) 07/17/2011  . Macrocytic anemia 06/25/2011  . Bruising 09/18/2010  . Coronary artery disease involving native coronary artery with angina pectoris ([place]) 08/13/2010  . Hypertension   . Hyperlipidemia     Past Surgical History:  Procedure Laterality Date  . ABDOMINAL MASS RESECTION     Mass near mesentery  . APPENDECTOMY     "took out during colon surgery"  . CARDIAC CATHETERIZATION  08/03/10  . CARDIAC CATHETERIZATION  08/09/10   LAD 30, stent OK, CFX 40, RCA < 20  . CARDIAC CATHETERIZATION N/A 05/20/2016   Procedure: Left Heart Cath and Coronary Angiography;  Surgeon: Lorretta Harp, MD;  Location: [facility];  Service: Cardiovascular;  Laterality: N/A;  . CATARACT EXTRACTION Bilateral   . CHOLECYSTECTOMY  2011  . COLON SURGERY    . CORONARY ANGIOPLASTY WITH STENT PLACEMENT  08/05/10   DES to the LAD  . CYSTOSCOPY     "with removal of kidney stone in office"  . CYSTOSCOPY WITH RETROGRADE PYELOGRAM, URETEROSCOPY AND STENT PLACEMENT Bilateral 05/18/2013   Procedure: CYSTOSCOPY WITH BILATERAL RETROGRADE PYELOGRAM, LEFT URETEROSCOPY AND LEFT STENT PLACEMENT;  Surgeon: Alexis Frock, MD;  Location: [facility] OR[facility];   Service: Urology;  Laterality: Bilateral;  . LEFT HEART CATHETERIZATION WITH CORONARY ANGIOGRAM N/A 08/15/2013   Procedure: LEFT HEART CATHETERIZATION WITH CORONARY ANGIOGRAM;  Surgeon: Peter M Martinique, MD;  Location: [facility] CATH LAB;  Service: Cardiovascular;  Laterality: N/A;  . RIGHT COLECTOMY    . TONSILLECTOMY         Home Medications    Prior to Admission medications   Medication Sig Start Date End Date Taking? Authorizing Provider  atorvastatin (LIPITOR) 40 MG tablet TAKE ONE TABLET BY MOUTH ONCE DAILY AT 6 PM 09/13/16  Yes Nahser, Wonda Cheng, MD  B Complex-C (B-COMPLEX WITH VITAMIN C) tablet Take 1 tablet by mouth daily.   Yes [provider]  carvedilol (COREG) 12.5 MG tablet Take 1.5 tablets (18.75 mg total) by mouth 2 (two) times daily. Patient taking differently: Take 12.5 mg by mouth 2 (two) times daily.  10/01/16 12/30/16 Yes Weaver, Scott T, PA-C  Cholecalciferol (VITAMIN D-3 PO) Take 1 tablet by mouth daily.    Yes [provider]  donepezil (ARICEPT) 10 MG tablet Take 10 mg by mouth at bedtime.  05/04/16  Yes [provider]  finasteride (PROSCAR) 5 MG tablet Take 5 mg by mouth at bedtime.  04/01/14  Yes [provider]  glimepiride (AMARYL) 1 MG tablet Take 1 mg by mouth daily.  05/29/16  Yes [provider]  isosorbide mononitrate (IMDUR) 120 MG 24 hr tablet Take 1 tablet (120 mg total) by mouth daily. 07/20/16  Yes Reyne Dumas, MD  linagliptin (TRADJENTA) 5 MG TABS tablet Take 1 tablet (5 mg total) by mouth daily. For blood sugar 11/04/15  Yes Johnson, Clanford L, MD  lisinopril (PRINIVIL,ZESTRIL) 2.5 MG tablet Take 1 tablet (2.5 mg total) by mouth daily. 09/02/16  Yes Reyne Dumas, MD  mupirocin ointment (BACTROBAN) 2 % Apply 1 application topically daily as needed (to affected area on forehead).  08/20/16  Yes [provider]  nitroGLYCERIN (NITROSTAT) 0.4 MG SL tablet PLACE ONE TABLET UNDER THE TONGUE EVERY 5 MINUTES AS NEEDED  FOR CHEST PAIN. 08/16/16  Yes Nahser, Wonda Cheng, MD  pantoprazole (PROTONIX) 40 MG tablet Take 1 tablet (40 mg total) by mouth daily. 07/19/16  Yes Reyne Dumas, MD  Propylene Glycol (SYSTANE BALANCE) 0.6 % SOLN Apply 1-2 drops to eye 3 (three) times daily as needed (for dry eyes).   Yes [provider]  RANEXA 1000 MG SR tablet TAKE ONE TABLET BY MOUTH TWICE DAILY 08/20/16  Yes Nahser, Wonda Cheng, MD  warfarin (COUMADIN) 4 MG tablet 4 mg-Friday, Saturday, Sunday, Monday, Wednesday  2 mg-Tuesday/Thursday 09/02/16  Yes Reyne Dumas, MD    Family History Family History  Problem Relation Age of Onset  . Cancer Mother   . Ulcers Father 40  . Heart attack Brother 72    Social History Social History  Substance Use Topics  . Smoking status: Former Smoker    Packs/day: 1.00    Years: 30.00    Types: Cigarettes    Quit date: 05/10/1994  . Smokeless tobacco: Never Used  . Alcohol use 1.2 oz/week    2 Shots of liquor per week     Comment: occasionally.     Allergies   Metformin and related   Review of Systems Review of Systems   Physical Exam Updated Vital Signs BP 120/66   Pulse 72   Temp 98.2 F (36.8 C) (Oral)   Resp 20   SpO2 92%   Physical Exam  Constitutional: He is oriented to person, place, and time. He appears well-developed and well-nourished.  HENT:  Head: Normocephalic and atraumatic.  Eyes: Conjunctivae are normal. Pupils are equal, round, and reactive to light. Right eye exhibits no discharge. Left eye exhibits no discharge. No scleral icterus.  Neck: Normal range of motion. No JVD present. No tracheal deviation present.  Pulmonary/Chest: Effort normal. No stridor.  Abdominal: Soft. He exhibits no distension and no mass. There is no tenderness. There is no rebound and no guarding. No hernia.  Genitourinary:  Genitourinary Comments: Exquisite tenderness with digital rectal exam; firm irregularity noted along the rectal wall- small amount of bright red  blood per rectum  Neurological: He is alert and oriented to person, place, and time. Coordination normal.  Psychiatric: He has a normal mood and affect. His behavior is normal. Judgment and thought content normal.  Nursing note and vitals reviewed.    ED Treatments / Results  Labs (all labs ordered are listed, but only abnormal results are displayed) Labs Reviewed  CBC WITH DIFFERENTIAL/PLATELET - Abnormal; Notable for the following:       Result Value  RBC 2.91 (*)    Hemoglobin 10.2 (*)    HCT 30.6 (*)    MCV 105.2 (*)    MCH 35.1 (*)    Lymphs Abs 0.4 (*)    All other components within normal limits  BASIC METABOLIC PANEL - Abnormal; Notable for the following:    CO2 21 (*)    Glucose, Bld 169 (*)    BUN 31 (*)    Creatinine, Ser 1.35 (*)    Calcium 8.7 (*)    GFR calc non Af Amer 44 (*)    GFR calc Af Amer 51 (*)    All other components within normal limits  PROTIME-INR - Abnormal; Notable for the following:    Prothrombin Time 40.7 (*)    INR 4.09 (*)    All other components within normal limits  POC OCCULT BLOOD, ED - Abnormal; Notable for the following:    Fecal Occult Bld POSITIVE (*)    All other components within normal limits  HEMOGLOBIN A1C  CBC  TROPONIN I  BASIC METABOLIC PANEL  CBC  PROTIME-INR  TROPONIN I  I-STAT TROPOININ, ED  PREPARE FRESH FROZEN PLASMA    EKG  EKG Interpretation  Date/Time:  Wednesday Oct 06 2016 10:50:58 EDT Ventricular Rate:  76 PR Interval:  176 QRS Duration: 174 QT Interval:  448 QTC Calculation: 504 R Axis:   106 Text Interpretation:  Sinus rhythm with Premature supraventricular complexes Right bundle branch block No significant change since last tracing Abnormal ekg Confirmed by Carmin Muskrat [phone]) on 10/06/2016 11:08:26 AM       Radiology Dg Chest 2 View  Result Date: 10/06/2016 CLINICAL DATA:  Chest tightness and weakness. No shortness of breath or cough. EXAM: CHEST  2 VIEW COMPARISON:  Two-view chest  x-ray 08/31/2016 and 05/19/2016. FINDINGS: The heart size is normal. Atherosclerotic changes are again noted in the aortic arch. Chronic interstitial coarsening is again seen in both lungs. No superimposed airspace disease or edema is evident. There are no significant effusions. Lung volumes are slightly lower. Degenerative changes are again noted in the thoracic spine. IMPRESSION: 1. Stable chronic interstitial lung disease without superimposed airspace disease or edema. 2. Aortic atherosclerosis. Electronically Signed   By: San Morelle M.D.   On: 10/06/2016 12:16    Procedures Procedures (including critical care time)  Medications Ordered in ED Medications  ranolazine (RANEXA) 12 hr tablet 1,000 mg (not administered)  pantoprazole (PROTONIX) EC tablet 40 mg (not administered)  donepezil (ARICEPT) tablet 10 mg (not administered)  finasteride (PROSCAR) tablet 5 mg (not administered)  sodium chloride flush (NS) 0.9 % injection 3 mL (not administered)  0.9 %  sodium chloride infusion (not administered)  ondansetron (ZOFRAN) tablet 4 mg (not administered)    Or  ondansetron (ZOFRAN) injection 4 mg (not administered)  insulin aspart (novoLOG) injection 0-9 Units (not administered)  sodium chloride 0.9 % bolus 1,000 mL (1,000 mLs Intravenous New Bag/Given 10/06/16 1416)  phytonadione (VITAMIN K) tablet 10 mg (10 mg Oral Given 10/06/16 1658)     Initial Impression / Assessment and Plan / ED Course  I have reviewed the triage vital signs and the nursing notes.  Pertinent labs & imaging results that were available during my care of the patient were reviewed by me and considered in my medical decision making (see chart for details).      Final Clinical Impressions(s) / ED Diagnoses   Final diagnoses:  Rectal bleeding  Elevated INR     83 year old  male presents with several complaints. Most notably patient has an INR of 4.09 with rectal bleeding. This is bright red blood, not  brisk, very small amount on rectal exam. Patient's INR yesterday was 3.1 per primary care provider. On exam patient has questionable abnormality on DRE. Patient is seen By [facility] gastroenterology, consult will be placed. Patient has a drop in hemoglobin from 11.0-10.2 in 5 days.  Patient has had increased weakness recently, question if this is secondary to increasing carvedilol. I reviewed patient's no from cardiology, it appears that his symptoms have been stable over the last several weeks and cardiology is aware of his ongoing chest discomfort. Patient's initial troponin reassuring here, low suspicion for ACS at this time.  Dr. Michail Sermon will see pt in the ED and plans for scope tomorrow morning pending correction on INR.    [facility] consulted for admission.    New Prescriptions New Prescriptions   No medications on file     Francee Gentile 10/06/16 1715    Carmin Muskrat, MD 10/08/16 [phone]

## 2016-10-07 ENCOUNTER — Encounter (HOSPITAL_COMMUNITY)
Admission: EM | Disposition: A | Discharge status: Home / Self Care | Payer: Self-pay | Source: Home / Self Care | Attending: Emergency Medicine

## 2016-10-07 DIAGNOSIS — D649 Anemia, unspecified: Secondary | ICD-10-CM | POA: Diagnosis not present

## 2016-10-07 DIAGNOSIS — I480 Paroxysmal atrial fibrillation: Secondary | ICD-10-CM

## 2016-10-07 DIAGNOSIS — E119 Type 2 diabetes mellitus without complications: Secondary | ICD-10-CM

## 2016-10-07 DIAGNOSIS — K921 Melena: Secondary | ICD-10-CM | POA: Diagnosis not present

## 2016-10-07 DIAGNOSIS — K625 Hemorrhage of anus and rectum: Secondary | ICD-10-CM

## 2016-10-07 DIAGNOSIS — N179 Acute kidney failure, unspecified: Secondary | ICD-10-CM | POA: Diagnosis not present

## 2016-10-07 DIAGNOSIS — R079 Chest pain, unspecified: Secondary | ICD-10-CM | POA: Diagnosis not present

## 2016-10-07 DIAGNOSIS — K629 Disease of anus and rectum, unspecified: Secondary | ICD-10-CM | POA: Diagnosis not present

## 2016-10-07 DIAGNOSIS — I25119 Atherosclerotic heart disease of native coronary artery with unspecified angina pectoris: Secondary | ICD-10-CM | POA: Diagnosis not present

## 2016-10-07 HISTORY — PX: FLEXIBLE SIGMOIDOSCOPY: SHX5431

## 2016-10-07 LAB — GLUCOSE, CAPILLARY
Glucose-Capillary: 102 mg/dL — ABNORMAL HIGH (ref 65–99)
Glucose-Capillary: 120 mg/dL — ABNORMAL HIGH (ref 65–99)
Glucose-Capillary: 204 mg/dL — ABNORMAL HIGH (ref 65–99)

## 2016-10-07 LAB — BASIC METABOLIC PANEL
Anion gap: 8 (ref 5–15)
BUN: 17 mg/dL (ref 6–20)
CALCIUM: 8.7 mg/dL — AB (ref 8.9–10.3)
CO2: 24 mmol/L (ref 22–32)
CREATININE: 1 mg/dL (ref 0.61–1.24)
Chloride: 106 mmol/L (ref 101–111)
GFR calc non Af Amer: >60 mL/min (ref >60)
Glucose, Bld: 81 mg/dL (ref 65–99)
Potassium: 4.1 mmol/L (ref 3.5–5.1)
Sodium: 138 mmol/L (ref 135–145)

## 2016-10-07 LAB — CBC
HCT: 30.6 % — ABNORMAL LOW (ref 39.0–52.0)
Hemoglobin: 10.1 g/dL — ABNORMAL LOW (ref 13.0–17.0)
MCH: 34.6 pg — ABNORMAL HIGH (ref 26.0–34.0)
MCHC: 33 g/dL (ref 30.0–36.0)
MCV: 104.8 fL — ABNORMAL HIGH (ref 78.0–100.0)
PLATELETS: 145 10*3/uL — AB (ref 150–400)
RBC: 2.92 MIL/uL — ABNORMAL LOW (ref 4.22–5.81)
RDW: 13.6 % (ref 11.5–15.5)
WBC: 4.9 10*3/uL (ref 4.0–10.5)

## 2016-10-07 LAB — HEMOGLOBIN A1C
Hgb A1c MFr Bld: 6.3 % — ABNORMAL HIGH (ref 4.8–5.6)
MEAN PLASMA GLUCOSE: 134 mg/dL

## 2016-10-07 LAB — TROPONIN I: Troponin I: 0.03 ng/mL (ref <0.03)

## 2016-10-07 LAB — PROTIME-INR
INR: 1.77
PROTHROMBIN TIME: 20.8 s — AB (ref 11.4–15.2)

## 2016-10-07 SURGERY — SIGMOIDOSCOPY, FLEXIBLE
Anesthesia: Moderate Sedation

## 2016-10-07 MED ORDER — FENTANYL CITRATE (PF) 100 MCG/2ML IJ SOLN
INTRAMUSCULAR | Status: AC
  Filled 2016-10-07: qty 2

## 2016-10-07 MED ORDER — MIDAZOLAM HCL 5 MG/ML IJ SOLN
INTRAMUSCULAR | Status: AC
  Filled 2016-10-07: qty 1

## 2016-10-07 MED ORDER — MIDAZOLAM HCL 10 MG/2ML IJ SOLN
INTRAMUSCULAR | Status: DC | PRN
  Administered 2016-10-07: 1 mg via INTRAVENOUS

## 2016-10-07 MED ORDER — CARVEDILOL 12.5 MG PO TABS
12.5000 mg | ORAL_TABLET | Freq: Two times a day (BID) | ORAL | Status: DC
  Administered 2016-10-07 – 2016-10-08 (×3): 12.5 mg via ORAL
  Filled 2016-10-07 (×3): qty 1

## 2016-10-07 MED ORDER — SODIUM CHLORIDE 0.9 % IV SOLN
INTRAVENOUS | Status: DC
  Administered 2016-10-07: 05:00:00 via INTRAVENOUS

## 2016-10-07 MED ORDER — FENTANYL CITRATE (PF) 100 MCG/2ML IJ SOLN
INTRAMUSCULAR | Status: DC | PRN
  Administered 2016-10-07: 25 ug via INTRAVENOUS

## 2016-10-07 NOTE — Progress Notes (Signed)
[facility] PROGRESS NOTE  [name] PYP:[id number] DOB: 1924-02-15 DOA: 10/06/2016  PCP: Lavone Orn, MD  Brief History/Interval Summary: 83 year old male with a past medical history of coronary artery disease status post PCI, chronic PVCs, hypertension, diabetes, atrial fibrillation on warfarin, history of metastatic colon cancer in remission, presented with complaints of bright red blood per rectum and there was some mention of chest pain as well. Patient was hospitalized for further management.  Reason for Visit: Hematochezia  Consultants: Gastroenterology  Procedures:  Flexible sigmoidoscopy Impression:                - Firm lesion in anorectum with bleeding following DRE found on perianal exam. - Likely malignant tumor in the distal rectum. Biopsied. - Internal hemorrhoids. - Anal papilla(e) were hypertrophied. - Stool in the rectum, in the recto-sigmoid colon and in the sigmoid colon.  Antibiotics: None  Subjective/Interval History: Patient feels well this morning. Denies any abdominal pain. No further episodes of bleeding. No chest pain or shortness of breath.  ROS: No nausea, vomiting  Objective:  Vital Signs  Vitals:   10/07/16 0900 10/07/16 0908 10/07/16 0918 10/07/16 1011  BP: (!) 137/47 (!) 119/40 (!) 135/44 (!) 149/69  Pulse: 70 72 (!) 57 71  Resp: (!) 22 17 20 18   Temp:    97.7 F (36.5 C)  TempSrc:    Oral  SpO2: (!) 89% 92% 91% 95%  Weight:      Height:        Intake/Output Summary (Last 24 hours) at 10/07/16 1231 Last data filed at 10/07/16 1030  Gross per 24 hour  Intake             2108 ml  Output             1350 ml  Net              758 ml   Filed Weights   10/06/16 1815 10/07/16 0430  Weight: 67.1 kg (147 lb 14.9 oz) 67.1 kg (147 lb 14.4 oz)    General appearance: alert, cooperative, appears stated age and no distress Resp: clear to auscultation bilaterally Cardio: S1, S2 is normal, regular. No S3, S4. Systolic  murmur appreciated over the precordium GI: soft, non-tender; bowel sounds normal; no masses,  no organomegaly Extremities: extremities normal, atraumatic, no cyanosis or edema Awake and alert. No focal neurological deficits  Lab Results:  Data Reviewed: I have personally reviewed following labs and imaging studies  CBC:  Recent Labs Lab 10/01/16 1122 10/06/16 1229 10/07/16 0444  WBC 6.9 6.0 4.9  NEUTROABS  --  4.6  --   HGB  --  10.2* 10.1*  HCT 32.8* 30.6* 30.6*  MCV 105* 105.2* 104.8*  PLT 198 174 145*    Basic Metabolic Panel:  Recent Labs Lab 10/01/16 1122 10/06/16 1229 10/07/16 0444  NA 135 135 138  K 4.5 4.4 4.1  CL 98 106 106  CO2 22 21* 24  GLUCOSE 209* 169* 81  BUN 21 31* 17  CREATININE 1.20 1.35* 1.00  CALCIUM 9.0 8.7* 8.7*  MG 1.9  --   --     GFR: Estimated Creatinine Clearance: 45.7 mL/min (by C-G formula based on SCr of 1 mg/dL).  Coagulation Profile:  Recent Labs Lab 10/06/16 1229 10/07/16 0444  INR 4.09* 1.77    Cardiac Enzymes:  Recent Labs Lab 10/07/16 0444  TROPONINI 0.03*   HbA1C:  Recent Labs  10/06/16 1229  HGBA1C 6.3*  CBG:  Recent Labs Lab 10/06/16 1845 10/06/16 2105 10/07/16 0754 10/07/16 1227  GLUCAP 100* 253* 102* 120*     Radiology Studies: Dg Chest 2 View  Result Date: 10/06/2016 CLINICAL DATA:  Chest tightness and weakness. No shortness of breath or cough. EXAM: CHEST  2 VIEW COMPARISON:  Two-view chest x-ray 08/31/2016 and 05/19/2016. FINDINGS: The heart size is normal. Atherosclerotic changes are again noted in the aortic arch. Chronic interstitial coarsening is again seen in both lungs. No superimposed airspace disease or edema is evident. There are no significant effusions. Lung volumes are slightly lower. Degenerative changes are again noted in the thoracic spine. IMPRESSION: 1. Stable chronic interstitial lung disease without superimposed airspace disease or edema. 2. Aortic atherosclerosis.  Electronically Signed   By: San Morelle M.D.   On: 10/06/2016 12:16     Medications:  Scheduled: . donepezil  10 mg Oral QHS  . finasteride  5 mg Oral QHS  . insulin aspart  0-9 Units Subcutaneous TID WC  . pantoprazole  40 mg Oral Daily  . ranolazine  1,000 mg Oral BID  . sodium chloride flush  3 mL Intravenous Q12H   Continuous:  JOI:NOMVEHMCNOB **OR** ondansetron (ZOFRAN) IV  Assessment/Plan:  Principal Problem:   BRBPR (bright red blood per rectum) Active Problems:   Hypertension   Coronary artery disease involving native coronary artery with angina pectoris (HCC)   Macrocytic anemia   Paroxysmal atrial fibrillation (HCC)   Cardiomyopathy (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 Also, in a patient with previous history of metastatic colon cancer. Patient seen by gastroenterology. Patient underwent flexible sigmoidoscopy. A friable mass was appreciated. Biopsies taken. Await pathology  Chest pain in the setting of known coronary artery disease Appears to be chronic. EKG did not show any acute changes. Troponin wasn't significant. No chest pain currently. Patient was recently hospitalized in April for workup of chest pain. He underwent stress test at that time which was moderate risk without any significant ischemia. He also underwent cardiac catheterization in January 2018. He was found to have some obstructive disease, but had patent LAD stent. Anatomy was stable. Medical management was recommended. Do not anticipate any further testing at this time.  Acute kidney injury. Creatinine has improved this morning. Continue to hold lisinopril for  now.  History of atrial fibrillation on anticoagulation Anticoagulation on hold due to hematochezia. Continue to hold until biopsy results are available. Okay to resume beta blocker.  History of diabetes mellitus type 2. Patient is on oral agents at home, which is on hold currently. Continue sliding scale insulin coverage.  Macrocytic anemia with component of acute blood loss as well Hemoglobin stable.  DVT Prophylaxis: SCDs    Code Status: Full code  Family Communication: Discussed with the patient and his sister  Disposition Plan: Management as outlined above    LOS: 1 day   [facility] Pager [phone] 10/07/2016, 12:31 PM  If 7PM-7AM, please contact night-coverage at [email or web address], password [facility]

## 2016-10-07 NOTE — Op Note (Signed)
[facility] Patient Name: [name] Procedure Date : 10/07/2016 MRN: [id number] Attending MD: Lear Ng , MD Date of Birth: [birth year] CSN: [id number] Age: 83 Admit Type: Inpatient Procedure:                Flexible Sigmoidoscopy Indications:              Hematochezia Providers:                Lear Ng, MD, Burtis Junes, RN, Tinnie Gens, Technician Referring MD:              Medicines:                Fentanyl 25 micrograms IV, Midazolam 2 mg IV Complications:            No immediate complications. Estimated Blood Loss:     Estimated blood loss was minimal. Procedure:                Pre-Anesthesia Assessment:                           - Prior to the procedure, a History and Physical                            was performed, and patient medications and                            allergies were reviewed. The patient's tolerance of                            previous anesthesia was also reviewed. The risks                            and benefits of the procedure and the sedation                            options and risks were discussed with the patient.                            All questions were answered, and informed consent                            was obtained. Prior Anticoagulants: The patient has                            taken Coumadin (warfarin), last dose was 2 days                            prior to procedure. ASA Grade Assessment: III - A                            patient with severe systemic disease. After  reviewing the risks and benefits, the patient was                            deemed in satisfactory condition to undergo the                            procedure.                           After obtaining informed consent, the scope was                            passed under direct vision. The EC-[id number] ([id number])                            scope was introduced through the  anus and advanced                            to the the sigmoid colon. The flexible                            sigmoidoscopy was performed with difficulty due to                            excessive bleeding and unprepped colon. Successful                            completion of the procedure was aided by lavage and                            performing the maneuvers documented (below) in this                            report. The patient tolerated the procedure well.                            The quality of the bowel preparation was an                            unprepped procedure. Scope In: Scope Out: Findings:      The perianal exam findings include firm lesion in anorectum with       bleeding following DRE.      A frond-like/villous non-obstructing medium-sized mass was found in the       distal rectum. The mass was partially circumferential (involving       one-third of the lumen circumference). Oozing was present. Biopsies were       taken with a cold forceps for histology. Estimated blood loss was       minimal.      Internal hemorrhoids were found during retroflexion. The hemorrhoids       were medium-sized.      Anal papilla(e) were hypertrophied.      Extensive amounts of semi-liquid semi-solid solid stool was found in the       rectum, in the recto-sigmoid colon and in the sigmoid  colon, interfering       with visualization. Impression:               - Firm lesion in anorectum with bleeding following                            DRE found on perianal exam.                           - Likely malignant tumor in the distal rectum.                            Biopsied.                           - Internal hemorrhoids.                           - Anal papilla(e) were hypertrophied.                           - Stool in the rectum, in the recto-sigmoid colon                            and in the sigmoid colon. Recommendation:           - Await pathology results. Procedure  Code(s):        --- Professional ---                           [id number], Sigmoidoscopy, flexible; with biopsy, single                            or multiple Diagnosis Code(s):        --- Professional ---                           K92.1, Melena (includes Hematochezia)                           D49.0, Neoplasm of unspecified behavior of                            digestive system                           K64.8, Other hemorrhoids                           K62.89, Other specified diseases of anus and rectum CPT copyright 2016 American [employer] Association. All rights reserved. The codes documented in this report are preliminary and upon coder review may  be revised to meet current compliance requirements. Lear Ng, MD 10/07/2016 9:07:03 AM This report has been signed electronically. Number of Addenda: 0

## 2016-10-07 NOTE — Care Management CC44 (Signed)
Condition Code 44 Documentation Completed  Patient Details  Name: [name] MRN: [id number] Date of Birth: 02/12/1924   Condition Code 44 given:  Yes Patient signature on Condition Code 44 notice:  Yes Documentation of 2 MD's agreement:  Yes Code 44 added to claim:  Yes    Royston Bake, RN 10/07/2016, 4:55 PM

## 2016-10-07 NOTE — Interval H&P Note (Signed)
History and Physical Interval Note:  10/07/2016 8:27 AM  [name]  has presented today for surgery, with the diagnosis of rectal bleeding  The various methods of treatment have been discussed with the patient and family. After consideration of risks, benefits and other options for treatment, the patient has consented to  Procedure(s): FLEXIBLE SIGMOIDOSCOPY (N/A) as a surgical intervention .  The patient's history has been reviewed, patient examined, no change in status, stable for surgery.  I have reviewed the patient's chart and labs.  Questions were answered to the patient's satisfaction.     [place] C.

## 2016-10-07 NOTE — Brief Op Note (Signed)
Distal rectal lesion concerning for malignancy that was friable. Biopsies taken. Also had a hypertrophied anal papillae and medium-sized internal hemorrhoids. Continue to hold anticoagulation and await biopsies (rush placed). See endopro note for details. Full liquid diet and advance as tolerated.

## 2016-10-07 NOTE — Progress Notes (Signed)
4 units of plasma transfused. No transfusion reaction noted. Patient tolerated well. No complaints at this time. Patient in bed resting. Will continue to monitor.

## 2016-10-07 NOTE — Care Management Obs Status (Signed)
[place] NOTIFICATION   Patient Details  Name: [name] MRN: [id number] Date of Birth: 26-Nov-1923   Medicare Observation Status Notification Given:       Royston Bake, RN 10/07/2016, 4:54 PM

## 2016-10-07 NOTE — H&P (View-Only) (Signed)
Referring Provider: Dr. Marily Memos Primary Care Physician:  Lavone Orn, MD Primary Gastroenterologist:  Dr. Oletta Lamas  Reason for Consultation:  Rectal bleeding  HPI: [name] is a 83 y.o. male with metastatic colon cancer (mesenteric lymph nodes) reportedly in remission in 2015 who is on chronic Coumadin due to Afib. He has been having a one month history of frequent rectal bleeding that is bright red blood with defecation. BMs have been loose BID since onset of this bleeding. Denies rectal pain or abdominal pain. Last colonoscopy in 2016 where a small serrated adenoma was removed (Dr. Amedeo Plenty). Also has a history of diverticulosis. Denies any N/V/dizziness. Hgb 10.2. MCV 105. INR 4.09. Sister at bedside.  Past Medical History:  Diagnosis Date  . Aortic stenosis    a. Mild-mod by echo 2013.  . Barrett esophagus   . Chronic anticoagulation   . Chronic systolic CHF (congestive heart failure) (HCC)    a. EF previously 35-40%. b. improved to 55-65% by cath 08/2013.  [name] Colon cancer ([place])    a. 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 b.  "graduated" from their practice 06/2013, remains free of any new disease per those notes.  . Coronary artery disease    a. s/p PCI w/ DES to mLAD 08/05/10. b. NSTEMI 08/2013 (mildly elev trop) - cath showing widely patent stent, 80% prox D1 (small vessel) with recommendation for medical management, residual 60% ostial PDA, <20% LCx, LVEF 55-65%.   . Depression   . Gallstone pancreatitis 2011   a. s/p chole.  [name] GERD (gastroesophageal reflux disease)   . Hyperlipidemia   . Hypertension   . Macrocytic anemia 06/25/2011  . Myelodysplastic disease ([place])    a. Suspected low-grade  myelodysplastic disease per onc notes.  . Other pancytopenia ([place]) 06/18/2014   Mild, fluctuating, likely med related. Rule out early MDS from prior chemo/RT or colon cancer  . PAF (paroxysmal atrial fibrillation) ([place])    a. failed TEE due to inability to pass probe into the  esophagus in March 2013. b. On Coumadin. Was in NSR 08/2013 admission.  . Premature atrial contractions   . Premature ventricular contractions   . SBO (small bowel obstruction) ([place]) 2005  . Type II diabetes mellitus ([place])     Past Surgical History:  Procedure Laterality Date  . ABDOMINAL MASS RESECTION     Mass near mesentery  . APPENDECTOMY     "took out during colon surgery"  . CARDIAC CATHETERIZATION  08/03/10  . CARDIAC CATHETERIZATION  08/09/10   LAD 30, stent OK, CFX 40, RCA < 20  . CARDIAC CATHETERIZATION N/A 05/20/2016   Procedure: Left Heart Cath and Coronary Angiography;  Surgeon: Lorretta Harp, MD;  Location: [facility];  Service: Cardiovascular;  Laterality: N/A;  . CATARACT EXTRACTION Bilateral   . CHOLECYSTECTOMY  2011  . COLON SURGERY    . CORONARY ANGIOPLASTY WITH STENT PLACEMENT  08/05/10   DES to the LAD  . CYSTOSCOPY     "with removal of kidney stone in office"  . CYSTOSCOPY WITH RETROGRADE PYELOGRAM, URETEROSCOPY AND STENT PLACEMENT Bilateral 05/18/2013   Procedure: CYSTOSCOPY WITH BILATERAL RETROGRADE PYELOGRAM, LEFT URETEROSCOPY AND LEFT STENT PLACEMENT;  Surgeon: Alexis Frock, MD;  Location: [facility];  Service: Urology;  Laterality: Bilateral;  . LEFT HEART CATHETERIZATION WITH CORONARY ANGIOGRAM N/A 08/15/2013   Procedure: LEFT HEART CATHETERIZATION WITH CORONARY ANGIOGRAM;  Surgeon: Peter M Martinique, MD;  Location: [facility] CATH LAB;  Service: Cardiovascular;  Laterality: N/A;  .  RIGHT COLECTOMY    . TONSILLECTOMY      Prior to Admission medications   Medication Sig Start Date End Date Taking? Authorizing Provider  atorvastatin (LIPITOR) 40 MG tablet TAKE ONE TABLET BY MOUTH ONCE DAILY AT 6 PM 09/13/16  Yes Nahser, Wonda Cheng, MD  B Complex-C (B-COMPLEX WITH VITAMIN C) tablet Take 1 tablet by mouth daily.   Yes [provider]  carvedilol (COREG) 12.5 MG tablet Take 1.5 tablets (18.75 mg total) by mouth 2 (two) times daily. Patient taking differently:  Take 12.5 mg by mouth 2 (two) times daily.  10/01/16 12/30/16 Yes Weaver, Scott T, PA-C  Cholecalciferol (VITAMIN D-3 PO) Take 1 tablet by mouth daily.    Yes [provider]  donepezil (ARICEPT) 10 MG tablet Take 10 mg by mouth at bedtime.  05/04/16  Yes [provider]  finasteride (PROSCAR) 5 MG tablet Take 5 mg by mouth at bedtime.  04/01/14  Yes [provider]  glimepiride (AMARYL) 1 MG tablet Take 1 mg by mouth daily.  05/29/16  Yes [provider]  isosorbide mononitrate (IMDUR) 120 MG 24 hr tablet Take 1 tablet (120 mg total) by mouth daily. 07/20/16  Yes Reyne Dumas, MD  linagliptin (TRADJENTA) 5 MG TABS tablet Take 1 tablet (5 mg total) by mouth daily. For blood sugar 11/04/15  Yes Johnson, Clanford L, MD  lisinopril (PRINIVIL,ZESTRIL) 2.5 MG tablet Take 1 tablet (2.5 mg total) by mouth daily. 09/02/16  Yes Reyne Dumas, MD  mupirocin ointment (BACTROBAN) 2 % Apply 1 application topically daily as needed (to affected area on forehead).  08/20/16  Yes [provider]  nitroGLYCERIN (NITROSTAT) 0.4 MG SL tablet PLACE ONE TABLET UNDER THE TONGUE EVERY 5 MINUTES AS NEEDED FOR CHEST PAIN. 08/16/16  Yes Nahser, Wonda Cheng, MD  pantoprazole (PROTONIX) 40 MG tablet Take 1 tablet (40 mg total) by mouth daily. 07/19/16  Yes Reyne Dumas, MD  Propylene Glycol (SYSTANE BALANCE) 0.6 % SOLN Apply 1-2 drops to eye 3 (three) times daily as needed (for dry eyes).   Yes [provider]  RANEXA 1000 MG SR tablet TAKE ONE TABLET BY MOUTH TWICE DAILY 08/20/16  Yes Nahser, Wonda Cheng, MD  warfarin (COUMADIN) 4 MG tablet 4 mg-Friday, Saturday, Sunday, Monday, Wednesday  2 mg-Tuesday/Thursday 09/02/16  Yes Reyne Dumas, MD  amLODipine (NORVASC) 5 MG tablet Take 1 tablet (5 mg total) by mouth daily. Patient not taking: Reported on 10/06/2016 09/08/16 12/07/16  Richardson Dopp T, PA-C    Scheduled Meds: Continuous Infusions: PRN Meds:.  Allergies as of 10/06/2016 -  Review Complete 10/06/2016  Allergen Reaction Noted  . Metformin and related Nausea And Vomiting 03/05/2011    Family History  Problem Relation Age of Onset  . Cancer Mother   . Ulcers Father 25  . Heart attack Brother 60    Social History   Social History  . Marital status: Single    Spouse name: N/A  . Number of children: 0  . Years of education: N/A   Occupational History  . Retired    Social History Main Topics  . Smoking status: Former Smoker    Packs/day: 1.00    Years: 30.00    Types: Cigarettes    Quit date: 05/10/1994  . Smokeless tobacco: Never Used  . Alcohol use 1.2 oz/week    2 Shots of liquor per week     Comment: occasionally.  . Drug use: No  . Sexual activity: No   Other  Topics Concern  . Not on file   Social History Narrative   Lives alone.      Review of Systems: All negative except as stated above in HPI.  Physical Exam: Vital signs:  T 98.2; P 68, BP 123/59, R 22   General:   Elderly, Alert,  Thin, pleasant and cooperative in NAD HEENT: anicteric sclera, oropharynx clear Neck: supple, nontender Lungs:  Clear throughout to auscultation.   No wheezes, crackles, or rhonchi. No acute distress. Heart:  Regular rate and rhythm; no murmurs, clicks, rubs,  or gallops. Abdomen: soft, nontender, nondistended, +BS  Rectal:  Firmness in anorectum with tenderness, brown loose stool noted on DRE, tender DRE, no thrombosed internal hemorrhoids, no blood on gloved finger, no fissure seen or palpated Ext: no edema  GI:  Lab Results:  Recent Labs  10/06/16 1229  WBC 6.0  HGB 10.2*  HCT 30.6*  PLT 174   BMET  Recent Labs  10/06/16 1229  NA 135  K 4.4  CL 106  CO2 21*  GLUCOSE 169*  BUN 31*  CREATININE 1.35*  CALCIUM 8.7*   LFT No results for input(s): PROT, ALBUMIN, AST, ALT, ALKPHOS, BILITOT, BILIDIR, IBILI in the last 72 hours. PT/INR  Recent Labs  10/06/16 1229  LABPROT 40.7*  INR 4.09*     Studies/Results: Dg Chest 2  View  Result Date: 10/06/2016 CLINICAL DATA:  Chest tightness and weakness. No shortness of breath or cough. EXAM: CHEST  2 VIEW COMPARISON:  Two-view chest x-ray 08/31/2016 and 05/19/2016. FINDINGS: The heart size is normal. Atherosclerotic changes are again noted in the aortic arch. Chronic interstitial coarsening is again seen in both lungs. No superimposed airspace disease or edema is evident. There are no significant effusions. Lung volumes are slightly lower. Degenerative changes are again noted in the thoracic spine. IMPRESSION: 1. Stable chronic interstitial lung disease without superimposed airspace disease or edema. 2. Aortic atherosclerosis. Electronically Signed   By: San Morelle M.D.   On: 10/06/2016 12:16    Impression/Plan: 83 yo with rectal bleeding on Coumadin with a history of metastatic colon cancer (in remission as of 2015). Last surveillance colonoscopy in 2016 with a small adenoma removed. Bleeding likely due to a rectal outlet source and flex sig needed to evaluate for malignancy. Correct INR with FFP and Vit K by primary team in case biopsies of rectum needed during tomorrow morning's flex sig (scheduled for 815AM). Full liquid diet. NPO p MN.    LOS: 0 days   [place] C.  10/06/2016, 4:09 PM  Pager [phone]  AFTER 5 pm or on weekends please call [phone]

## 2016-10-07 NOTE — Progress Notes (Signed)
CRITICAL VALUE ALERT  Critical Value: Troponin 0.03  Date & Time Notied:  10/07/2016 1859 Provider Notified: Dr Hal Hope  Orders Received/Actions taken:

## 2016-10-08 ENCOUNTER — Encounter (HOSPITAL_COMMUNITY): Payer: Self-pay | Admitting: Gastroenterology

## 2016-10-08 DIAGNOSIS — K629 Disease of anus and rectum, unspecified: Secondary | ICD-10-CM | POA: Diagnosis not present

## 2016-10-08 DIAGNOSIS — D649 Anemia, unspecified: Secondary | ICD-10-CM | POA: Diagnosis not present

## 2016-10-08 DIAGNOSIS — K625 Hemorrhage of anus and rectum: Secondary | ICD-10-CM | POA: Diagnosis not present

## 2016-10-08 DIAGNOSIS — I480 Paroxysmal atrial fibrillation: Secondary | ICD-10-CM | POA: Diagnosis not present

## 2016-10-08 LAB — CBC
HCT: 33.4 % — ABNORMAL LOW (ref 39.0–52.0)
Hemoglobin: 11.3 g/dL — ABNORMAL LOW (ref 13.0–17.0)
MCH: 34.9 pg — AB (ref 26.0–34.0)
MCHC: 33.8 g/dL (ref 30.0–36.0)
MCV: 103.1 fL — ABNORMAL HIGH (ref 78.0–100.0)
PLATELETS: 157 10*3/uL (ref 150–400)
RBC: 3.24 MIL/uL — ABNORMAL LOW (ref 4.22–5.81)
RDW: 13.2 % (ref 11.5–15.5)
WBC: 5.4 10*3/uL (ref 4.0–10.5)

## 2016-10-08 LAB — BPAM FFP
Blood Product Expiration Date: 201806022359
Blood Product Expiration Date: 201806022359
Blood Product Expiration Date: 201806032359
Blood Product Expiration Date: 201806032359
ISSUE DATE / TIME: 201805301814
ISSUE DATE / TIME: 201805302050
ISSUE DATE / TIME: 201805302341
ISSUE DATE / TIME: 201805310230
Unit Type and Rh: 600
Unit Type and Rh: 6200
Unit Type and Rh: 6200
Unit Type and Rh: 6200

## 2016-10-08 LAB — GLUCOSE, CAPILLARY
GLUCOSE-CAPILLARY: 101 mg/dL — AB (ref 65–99)
GLUCOSE-CAPILLARY: 122 mg/dL — AB (ref 65–99)
Glucose-Capillary: 71 mg/dL (ref 65–99)
Glucose-Capillary: 198 mg/dL — ABNORMAL HIGH (ref 65–99)
Glucose-Capillary: 219 mg/dL — ABNORMAL HIGH (ref 65–99)

## 2016-10-08 LAB — PREPARE FRESH FROZEN PLASMA
UNIT DIVISION: 0
Unit division: 0
Unit division: 0
Unit division: 0

## 2016-10-08 LAB — BASIC METABOLIC PANEL
Anion gap: 8 (ref 5–15)
BUN: 13 mg/dL (ref 6–20)
CALCIUM: 8.8 mg/dL — AB (ref 8.9–10.3)
CHLORIDE: 103 mmol/L (ref 101–111)
CO2: 24 mmol/L (ref 22–32)
CREATININE: 0.95 mg/dL (ref 0.61–1.24)
GFR calc Af Amer: >60 mL/min (ref >60)
GFR calc non Af Amer: >60 mL/min (ref >60)
Glucose, Bld: 93 mg/dL (ref 65–99)
Potassium: 3.8 mmol/L (ref 3.5–5.1)
SODIUM: 135 mmol/L (ref 135–145)

## 2016-10-08 MED ORDER — CARVEDILOL 12.5 MG PO TABS
12.5000 mg | ORAL_TABLET | Freq: Two times a day (BID) | ORAL | Status: DC
  Administered 2016-10-09: 12.5 mg via ORAL
  Filled 2016-10-08: qty 1

## 2016-10-08 NOTE — Progress Notes (Signed)
[facility] Progress Note  [name] 83 y.o. Apr 30, 1924   Subjective: Denies any rectal bleeding or BMs overnight. Feels ok. Sister and nephew at bedside.  Objective: Vital signs in last 24 hours: Vitals:   10/07/16 2008 10/08/16 0610  BP: 135/62 133/61  Pulse: 60 75  Resp: 18 18  Temp: 97.8 F (36.6 C) 97.9 F (36.6 C)    Physical Exam: Gen: elderly, lethargic, well-nourished, no acute distress CV: RRR Chest: CTA B Abd: soft, nontender, nondistended, +BS Ext: no edema  Lab Results:  Recent Labs  10/07/16 0444 10/08/16 0332  NA 138 135  K 4.1 3.8  CL 106 103  CO2 24 24  GLUCOSE 81 93  BUN 17 13  CREATININE 1.00 0.95  CALCIUM 8.7* 8.8*   No results for input(s): AST, ALT, ALKPHOS, BILITOT, PROT, ALBUMIN in the last 72 hours.  Recent Labs  10/06/16 1229 10/07/16 0444 10/08/16 0332  WBC 6.0 4.9 5.4  NEUTROABS 4.6  --   --   HGB 10.2* 10.1* 11.3*  HCT 30.6* 30.6* 33.4*  MCV 105.2* 104.8* 103.1*  PLT 174 145* 157    Recent Labs  10/06/16 1229 10/07/16 0444  LABPROT 40.7* 20.8*  INR 4.09* 1.77      Assessment/Plan: Malignant-appearing rectal lesion that is likely the source of his recent rectal bleeding. Biopsies pending. Will need surgery consult after pathology results are back to discuss whether any role for surgical management. If a malignant rectal cancer, then may benefit from radiation treatment instead of surgery. The lesion is small and non-obstructing but he is at increased risk of further bleeding from this lesion especially if anticoagulation is resumed without management of this lesion. If biopsies are inconclusive, then he may need a rectal EUS to further evaluate this lesion but only if he is a candidate for treatment. Advance diet. Supportive care. D/W Dr. [place] Pink and family. Dr. Cristina Gong will f/u tomorrow.   [place] C. 10/08/2016, 10:24 AM   Pager [phone]  AFTER 5 pm or on weekends call  [phone] ID: [name], male   DOB: 1923-12-15, 83 y.o.   MRN: [id number]

## 2016-10-08 NOTE — Progress Notes (Signed)
Patient ID: [name], male   DOB: [birth year], 83 y.o.   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 Lesion not amenable to removal with the colonoscope. Would ask cardiology if anticoagulation can be discontinued indefinitely and if so would manage this polypoid lesion conservatively. If not then he will need to have it removed surgically. D/W Dr. [place] Pink. Dr. Cristina Gong from [facility] available to see tomorrow if needed. Call us back if necessary. Called his nurse and relayed results and plan to his nephew by phone, who was in his room.

## 2016-10-08 NOTE — Evaluation (Signed)
Physical Therapy Evaluation/ Discharge Patient Details Name: [name] MRN: [id number] DOB: Oct 31, 1923 Today's Date: 10/08/2016   History of Present Illness  83 yo admitted with BRBPR with rectal lesion. PMHx:CAD, HTN, DM, Afib, met colon CA  Clinical Impression  Pt in room with sister on arrival, very pleasant and eager to mobilize. Pt moving well, walking long hall distance without difficulty and currently at baseline functional status. Pt with quick gait, no fall history and manages well on his own at home with sister confirming all. Pt currently without need for therapy, at baseline and safe for return home. Recommend daily ambulation, no further needs, will sign off with pt aware and agreeable.     Follow Up Recommendations No PT follow up    Equipment Recommendations  None recommended by PT    Recommendations for Other Services       Precautions / Restrictions Precautions Precautions: None      Mobility  Bed Mobility Overal bed mobility: Modified Independent                Transfers Overall transfer level: Modified independent                  Ambulation/Gait Ambulation/Gait assistance: Independent Ambulation Distance (Feet): 600 Feet Assistive device: None Gait Pattern/deviations: WFL(Within Functional Limits)   Gait velocity interpretation: at or above normal speed for age/gender General Gait Details: pt with quick, steady gait including head turns and change of direction  Stairs            Wheelchair Mobility    Modified Rankin (Stroke Patients Only)       Balance Overall balance assessment: No apparent balance deficits (not formally assessed)                                           Pertinent Vitals/Pain Pain Assessment: No/denies pain    Home Living Family/patient expects to be discharged to:: Private residence Living Arrangements: Alone Available Help at Discharge: Family;Available  PRN/intermittently Type of Home: House Home Access: Level entry     Home Layout: One level Home Equipment: None      Prior Function Level of Independence: Independent         Comments: Drives, goes to lunch daily with group of friends     Hand Dominance        Extremity/Trunk Assessment   Upper Extremity Assessment Upper Extremity Assessment: Overall WFL for tasks assessed    Lower Extremity Assessment Lower Extremity Assessment: Overall WFL for tasks assessed    Cervical / Trunk Assessment Cervical / Trunk Assessment: Normal  Communication   Communication: No difficulties  Cognition Arousal/Alertness: Awake/alert Behavior During Therapy: WFL for tasks assessed/performed Overall Cognitive Status: Within Functional Limits for tasks assessed                                        General Comments      Exercises     Assessment/Plan    PT Assessment Patent does not need any further PT services  PT Problem List         PT Treatment Interventions      PT Goals (Current goals can be found in the Care Plan section)  Acute Rehab PT Goals PT Goal Formulation: All assessment  and education complete, DC therapy    Frequency     Barriers to discharge        Co-evaluation               AM-PAC PT "6 Clicks" Daily Activity  Outcome Measure Difficulty turning over in bed (including adjusting bedclothes, sheets and blankets)?: None Difficulty moving from lying on back to sitting on the side of the bed? : None Difficulty sitting down on and standing up from a chair with arms (e.g., wheelchair, bedside commode, etc,.)?: None Help needed moving to and from a bed to chair (including a wheelchair)?: None Help needed walking in hospital room?: None Help needed climbing 3-5 steps with a railing? : None 6 Click Score: 24    End of Session   Activity Tolerance: Patient tolerated treatment well Patient left: in chair;with call bell/phone  within reach;with family/visitor present Nurse Communication: Mobility status PT Visit Diagnosis: Other abnormalities of gait and mobility (R26.89)    Time: 9967-2277 PT Time Calculation (min) (ACUTE ONLY): 10 min   Charges:   PT Evaluation $PT Eval Low Complexity: 1 Procedure     PT G Codes:   PT G-Codes **NOT FOR INPATIENT CLASS** Functional Assessment Tool Used: AM-PAC 6 Clicks Basic Mobility Functional Limitation: Mobility: Walking and moving around Mobility: Walking and Moving Around Current Status (T7505): 0 percent impaired, limited or restricted Mobility: Walking and Moving Around Goal Status (J0712): 0 percent impaired, limited or restricted Mobility: Walking and Moving Around Discharge Status (R2479): 0 percent impaired, limited or restricted    Elwyn Reach, PT [place] 10/08/2016, 1:11 PM

## 2016-10-08 NOTE — Progress Notes (Signed)
Advance Beneficiary Notice of Noncoverage (ABN) given to patient as requested by Rod Holler RN UR Reviewer and Dr Burnard Bunting Medical Director. Letter explained in detail, all questions answered. Mindi Slicker [facility] [phone]

## 2016-10-08 NOTE — Progress Notes (Signed)
[facility] PROGRESS NOTE  [name] XTG:[id number] DOB: 11-13-1923 DOA: 10/06/2016  PCP: Lavone Orn, MD  Brief History/Interval Summary: 83 year old male with a past medical history of coronary artery disease status post PCI, chronic PVCs, hypertension, diabetes, atrial fibrillation on warfarin, history of metastatic colon cancer in remission, presented with complaints of bright red blood per rectum and there was some mention of chest pain as well. Patient was hospitalized for further management.  Reason for Visit: Hematochezia  Consultants: Gastroenterology  Procedures:  Flexible sigmoidoscopy Impression:                - Firm lesion in anorectum with bleeding following DRE found on perianal exam. - Likely malignant tumor in the distal rectum. Biopsied. - Internal hemorrhoids. - Anal papilla(e) were hypertrophied. - Stool in the rectum, in the recto-sigmoid colon and in the sigmoid colon.  Antibiotics: None  Subjective/Interval History: Patient feels well. Denies complaints. No further episodes of blood in stool. Denies nausea, vomiting. No abdominal pain.   ROS: Denies any chest pain or shortness of breath  Objective:  Vital Signs  Vitals:   10/07/16 0918 10/07/16 1011 10/07/16 2008 10/08/16 0610  BP: (!) 135/44 (!) 149/69 135/62 133/61  Pulse: (!) 57 71 60 75  Resp: 20 18 18 18   Temp:  97.7 F (36.5 C) 97.8 F (36.6 C) 97.9 F (36.6 C)  TempSrc:  Oral Oral Oral  SpO2: 91% 95% 94% 93%  Weight:    67.4 kg (148 lb 9.6 oz)  Height:        Intake/Output Summary (Last 24 hours) at 10/08/16 0815 Last data filed at 10/07/16 1804  Gross per 24 hour  Intake              120 ml  Output              320 ml  Net             -200 ml   Filed Weights   10/06/16 1815 10/07/16 0430 10/08/16 0610  Weight: 67.1 kg (147 lb 14.9 oz) 67.1 kg (147 lb 14.4 oz) 67.4 kg (148 lb 9.6 oz)    General appearance: Awake, alert. In no distress Resp: Clear to  auscultation bilaterally. No wheezing, rales, rhonchi Cardio: S1, S2 is normal. Regular. No S3, S4. Systolic murmur appreciated over the precordium GI: Abdomen is soft. Nontender, nondistended. Bowel sounds are present. No masses or organomegaly Extremities: No edema Awake and alert. No focal neurological deficits.  Lab Results:  Data Reviewed: I have personally reviewed following labs and imaging studies  CBC:  Recent Labs Lab 10/01/16 1122 10/06/16 1229 10/07/16 0444 10/08/16 0332  WBC 6.9 6.0 4.9 5.4  NEUTROABS  --  4.6  --   --   HGB  --  10.2* 10.1* 11.3*  HCT 32.8* 30.6* 30.6* 33.4*  MCV 105* 105.2* 104.8* 103.1*  PLT 198 174 145* 270    Basic Metabolic Panel:  Recent Labs Lab 10/01/16 1122 10/06/16 1229 10/07/16 0444 10/08/16 0332  NA 135 135 138 135  K 4.5 4.4 4.1 3.8  CL 98 106 106 103  CO2 22 21* 24 24  GLUCOSE 209* 169* 81 93  BUN 21 31* 17 13  CREATININE 1.20 1.35* 1.00 0.95  CALCIUM 9.0 8.7* 8.7* 8.8*  MG 1.9  --   --   --     GFR: Estimated Creatinine Clearance: 48.3 mL/min (by C-G formula based on SCr of 0.95 mg/dL).  Coagulation Profile:  Recent Labs Lab 10/06/16 1229 10/07/16 0444  INR 4.09* 1.77    Cardiac Enzymes:  Recent Labs Lab 10/07/16 0444  TROPONINI 0.03*   HbA1C:  Recent Labs  10/06/16 1229  HGBA1C 6.3*    CBG:  Recent Labs Lab 10/06/16 2105 10/07/16 0754 10/07/16 1227 10/07/16 1633 10/08/16 0805  GLUCAP 253* 102* 120* 204* 101*     Radiology Studies: Dg Chest 2 View  Result Date: 10/06/2016 CLINICAL DATA:  Chest tightness and weakness. No shortness of breath or cough. EXAM: CHEST  2 VIEW COMPARISON:  Two-view chest x-ray 08/31/2016 and 05/19/2016. FINDINGS: The heart size is normal. Atherosclerotic changes are again noted in the aortic arch. Chronic interstitial coarsening is again seen in both lungs. No superimposed airspace disease or edema is evident. There are no significant effusions. Lung volumes  are slightly lower. Degenerative changes are again noted in the thoracic spine. IMPRESSION: 1. Stable chronic interstitial lung disease without superimposed airspace disease or edema. 2. Aortic atherosclerosis. Electronically Signed   By: San Morelle M.D.   On: 10/06/2016 12:16     Medications:  Scheduled: . carvedilol  12.5 mg Oral BID WC  . donepezil  10 mg Oral QHS  . finasteride  5 mg Oral QHS  . insulin aspart  0-9 Units Subcutaneous TID WC  . pantoprazole  40 mg Oral Daily  . ranolazine  1,000 mg Oral BID  . sodium chloride flush  3 mL Intravenous Q12H   Continuous:  OAC:[id number] **OR** ondansetron (ZOFRAN) IV  Assessment/Plan:  Principal Problem:   BRBPR (bright red blood per rectum) Active Problems:   Hypertension   Coronary artery disease involving native coronary artery with angina pectoris (HCC)   Macrocytic anemia   Paroxysmal atrial fibrillation (HCC)   Cardiomyopathy ([place])   Colon cancer metastasized to mesenteric lymph nodes (HCC)   Non-insulin dependent type 2 diabetes mellitus (HCC)   Pain in the chest   Elevated INR   Anemia, unspecified   Chronic diastolic heart failure ([place])   Acute kidney injury ([place])    Hematochezia due to possible malignant tumor in the rectum This was in the setting of anticoagulation. Also, in a patient with previous history of metastatic colon cancer. Patient seen by gastroenterology. Patient underwent flexible sigmoidoscopy. A friable mass was appreciated. Biopsies taken. Await pathology.  Chest pain. In the setting of known coronary artery disease Appears to be chronic. EKG did not show any acute changes. Troponin wasn't significant. No chest pain currently. Patient was recently hospitalized in April for workup of chest pain. He underwent stress test at that time which was moderate risk without any significant ischemia. He also underwent cardiac catheterization in January 2018. He was found to have some obstructive  disease, but had patent LAD stent. Anatomy was stable. Medical management was recommended. Do not anticipate any further testing at this time.  Acute kidney injury. Renal failure has resolved. Creatinine is back to normal. Could resume his lisinopril soon.   History of atrial fibrillation on anticoagulation Anticoagulation on hold due to hematochezia. Continue to hold until pathology results are available. Back on beta blocker.  History of diabetes mellitus type 2. HbA1c 6.3. Patient is on oral agents at home, which is on hold currently. Continue sliding scale insulin coverage.  Macrocytic anemia with component of acute blood loss as well Hemoglobin stable. Has not required any blood transfusion.  DVT Prophylaxis: SCDs    Code Status: Full code  Family Communication: Discussed with the  patient Disposition Plan: Management as outlined above. Mobilize as tolerated.    LOS: 1 day   [facility] Pager [phone] 10/08/2016, 8:15 AM  If 7PM-7AM, please contact night-coverage at [email or web address], password [email or web address]

## 2016-10-08 NOTE — Progress Notes (Signed)
OT Cancellation Note  Patient Details Name: [name] MRN: [id number] DOB: [birth year]   Cancelled Treatment:    Reason Eval/Treat Not Completed: PT screened, no needs identified, will sign off. Pt is independent and has no concerns for OT  Keri Veale 10/08/2016, 1:22 PM  Lesle Chris, OTR/L [phone] 10/08/2016

## 2016-10-09 DIAGNOSIS — K629 Disease of anus and rectum, unspecified: Secondary | ICD-10-CM | POA: Diagnosis not present

## 2016-10-09 LAB — GLUCOSE, CAPILLARY: Glucose-Capillary: 158 mg/dL — ABNORMAL HIGH (ref 65–99)

## 2016-10-09 MED ORDER — ASPIRIN EC 81 MG PO TBEC: 81.0000 mg | DELAYED_RELEASE_TABLET | Freq: Every day | ORAL | 0 refills | Status: AC

## 2016-10-09 NOTE — Progress Notes (Signed)
Patient is discharge to home accompanied by patient's spouse  and NT via wheelchair. Prescriptions and discharge instructions given . Patient and spouse verbalizes understanding. All personal belongings given. Telemetry box and IV removed prior to discharge and site in good condition.

## 2016-10-09 NOTE — Discharge Summary (Signed)
[facility]  Physician Discharge Summary   Patient ID: [name] MRN: [id number] DOB/AGE: 1923/05/22 83 y.o.  Admit date: 10/06/2016 Discharge date: 10/09/2016  PCP: Lavone Orn, MD  DISCHARGE DIAGNOSES:  Principal Problem:   BRBPR (bright red blood per rectum) Active Problems:   Hypertension   Coronary artery disease involving native coronary artery with angina pectoris (HCC)   Macrocytic anemia   Paroxysmal atrial fibrillation (HCC)   Cardiomyopathy ([place])   Colon cancer metastasized to mesenteric lymph nodes (HCC)   Non-insulin dependent type 2 diabetes mellitus ([place])   Pain in the chest   Elevated INR   Anemia, unspecified   Chronic diastolic heart failure ([place])   Acute kidney injury ([place])   RECOMMENDATIONS FOR OUTPATIENT FOLLOW UP: 1. Close follow-up with primary care physician to discuss further management of the rectal mass 2. Anticoagulation placed on hold for now. See discussion below.  DISCHARGE CONDITION: fair  Diet recommendation: As before  Filed Weights   10/07/16 0430 10/08/16 0610 10/09/16 0500  Weight: 67.1 kg (147 lb 14.4 oz) 67.4 kg (148 lb 9.6 oz) 65.3 kg (144 lb)    INITIAL HISTORY: 83 year old male with a past medical history of coronary artery disease status post PCI, chronic PVCs, hypertension, diabetes, atrial fibrillation on warfarin, history of metastatic colon cancer in remission, presented with complaints of bright red blood per rectum and there was some mention of chest pain as well. Patient was hospitalized for further management.  Consultants: Gastroenterology  Procedures:  Flexible sigmoidoscopy Impression:  - Firm lesion in anorectum with bleeding following DRE found on perianal exam. - Likely malignant tumor in the distal rectum. Biopsied. - Internal hemorrhoids. - Anal papilla(e) were hypertrophied. - Stool in the rectum, in the recto-sigmoid colon and in the sigmoid colon.   HOSPITAL COURSE:     Hematochezia due to rectal mass This was in the setting of anticoagulation. Also, in a patient with previous history of metastatic colon cancer. Patient seen by gastroenterology. Patient underwent flexible sigmoidoscopy. A friable mass was appreciated. Biopsies taken. 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 Per gastroenterology this lesion can be managed conservatively and non-surgically if anticoagulation is discontinued. If not, the lesion will have to be surgically removed to prevent further episodes of bleeding while on anticoagulation. However, at the same time patient considered high surgical risk considering his advanced age and medical comorbidities. Discussed in detail with patient and his sister. Continuing to take anticoagulation will also place him at risk for recurrent bleeding which could result in anemia, which could worsen his angina. Stopping anticoagulation will increase his risk of stroke. These issues were also discussed with patient's cardiologist, Dr. [name]. Patient elects to stop taking anticoagulation for now. He will discuss this further with his primary care physician.  Chest pain in the setting of known coronary artery disease Appears to be chronic. EKG did not show any acute changes. Troponin wasn't significant. Patient was recently hospitalized in April for workup of chest pain. He underwent stress test at that time which was moderate risk without any significant ischemia. He also underwent cardiac catheterization in January 2018. He was found to have some obstructive disease, but had patent LAD stent. Anatomy was stable. Medical management was recommended.   Acute kidney injury. Renal failure has resolved. Creatinine is back to normal.   History of atrial fibrillation on anticoagulation Currently in sinus rhythm. Continue beta blocker. See discussion above regarding anticoagulation. Patient electing to discontinue warfarin for now. He  will discuss further with his primary care provider. Will initiate aspirin for now.  History of diabetes mellitus type 2. HbA1c 6.3. Continue home medication  Macrocytic anemia with component of acute blood loss as well Hemoglobin stable. Has not required any blood transfusion.  Overall, stable. Has not had any further episodes of rectal bleeding. Discussed in detail with patient and his sister. Okay for discharge home today.  PERTINENT LABS:  The results of significant diagnostics from this hospitalization (including imaging, microbiology, ancillary and laboratory) are listed below for reference.      Labs: Basic Metabolic Panel:  Recent Labs Lab 10/06/16 1229 10/07/16 0444 10/08/16 0332  NA 135 138 135  K 4.4 4.1 3.8  CL 106 106 103  CO2 21* 24 24  GLUCOSE 169* 81 93  BUN 31* 17 13  CREATININE 1.35* 1.00 0.95  CALCIUM 8.7* 8.7* 8.8*   CBC:  Recent Labs Lab 10/06/16 1229 10/07/16 0444 10/08/16 0332  WBC 6.0 4.9 5.4  NEUTROABS 4.6  --   --   HGB 10.2* 10.1* 11.3*  HCT 30.6* 30.6* 33.4*  MCV 105.2* 104.8* 103.1*  PLT 174 145* 157   Cardiac Enzymes:  Recent Labs Lab 10/07/16 0444  TROPONINI 0.03*    CBG:  Recent Labs Lab 10/08/16 0805 10/08/16 1241 10/08/16 1608 10/08/16 2122 10/09/16 0839  GLUCAP 101* 198* 219* 122* 158*     IMAGING STUDIES Dg Chest 2 View  Result Date: 10/06/2016 CLINICAL DATA:  Chest tightness and weakness. No shortness of breath or cough. EXAM: CHEST  2 VIEW COMPARISON:  Two-view chest x-ray 08/31/2016 and 05/19/2016. FINDINGS: The heart size is normal. Atherosclerotic changes are again noted in the aortic arch. Chronic interstitial coarsening is again seen in both lungs. No superimposed airspace disease or edema is evident. There are no significant effusions. Lung volumes are slightly lower. Degenerative changes are again noted in the thoracic spine. IMPRESSION: 1. Stable chronic interstitial lung disease without  superimposed airspace disease or edema. 2. Aortic atherosclerosis. Electronically Signed   By: San Morelle M.D.   On: 10/06/2016 12:16    DISCHARGE EXAMINATION: Vitals:   10/08/16 0610 10/08/16 2029 10/09/16 0500 10/09/16 0838  BP: 133/61 (!) 132/56 (!) 128/51 135/87  Pulse: 75 68 73 89  Resp: 18 18 18 18   Temp: 97.9 F (36.6 C) 98 F (36.7 C) 98 F (36.7 C) 98 F (36.7 C)  TempSrc: Oral Oral Oral   SpO2: 93% 95% 93% 95%  Weight: 67.4 kg (148 lb 9.6 oz)  65.3 kg (144 lb)   Height:       General appearance: alert, cooperative, appears stated age and no distress Resp: clear to auscultation bilaterally Cardio: regular rate and rhythm, S1, S2 normal, no murmur, click, rub or gallop GI: soft, non-tender; bowel sounds normal; no masses,  no organomegaly  DISPOSITION: Home with sister  Discharge Instructions    Call MD for:  difficulty breathing, headache or visual disturbances    Complete by:  As directed    Call MD for:  extreme fatigue    Complete by:  As directed    Call MD for:  persistant dizziness or light-headedness    Complete by:  As directed    Call MD for:  persistant nausea and vomiting    Complete by:  As directed    Call MD for:  severe uncontrolled pain    Complete by:  As directed    Call MD for:  temperature >100.4  Complete by:  As directed    Diet - low sodium heart healthy    Complete by:  As directed    Discharge instructions    Complete by:  As directed    Please stop taking warfarin (blood thinner) for now. Take aspirin instead. Please see Dr. Laurann Montana regarding rectal mass and to continue conversation regarding the best way to manage this. Seek attention if notice any bleeding again.  You were cared for by a hospitalist during your hospital stay. If you have any questions about your discharge medications or the care you received while you were in the hospital after you are discharged, you can call the unit and asked to speak with the  hospitalist on call if the hospitalist that took care of you is not available. Once you are discharged, your primary care physician will handle any further medical issues. Please note that NO REFILLS for any discharge medications will be authorized once you are discharged, as it is imperative that you return to your primary care physician (or establish a relationship with a primary care physician if you do not have one) for your aftercare needs so that they can reassess your need for medications and monitor your lab values. If you do not have a primary care physician, you can call [phone] for a physician referral.   Increase activity slowly    Complete by:  As directed       ALLERGIES:  Allergies  Allergen Reactions  . Metformin And Related Nausea And Vomiting     Discharge Medication List as of 10/09/2016 10:56 AM    START taking these medications   Details  aspirin EC 81 MG tablet Take 1 tablet (81 mg total) by mouth daily., Starting Sat 10/09/2016, Print      CONTINUE these medications which have NOT CHANGED   Details  atorvastatin (LIPITOR) 40 MG tablet TAKE ONE TABLET BY MOUTH ONCE DAILY AT 6 PM, Normal    B Complex-C (B-COMPLEX WITH VITAMIN C) tablet Take 1 tablet by mouth daily., Historical Med    carvedilol (COREG) 12.5 MG tablet Take 1.5 tablets (18.75 mg total) by mouth 2 (two) times daily., Starting Fri 10/01/2016, Until Thu 12/30/2016, Normal    Cholecalciferol (VITAMIN D-3 PO) Take 1 tablet by mouth daily. , Historical Med    donepezil (ARICEPT) 10 MG tablet Take 10 mg by mouth at bedtime. , Starting Tue 05/04/2016, Historical Med    finasteride (PROSCAR) 5 MG tablet Take 5 mg by mouth at bedtime. , Starting Mon 04/01/2014, Historical Med    glimepiride (AMARYL) 1 MG tablet Take 1 mg by mouth daily. , Starting Sat 05/29/2016, Historical Med    isosorbide mononitrate (IMDUR) 120 MG 24 hr tablet Take 1 tablet (120 mg total) by mouth daily., Starting Tue 07/20/2016, Normal      linagliptin (TRADJENTA) 5 MG TABS tablet Take 1 tablet (5 mg total) by mouth daily. For blood sugar, Starting Tue 11/04/2015, Normal    lisinopril (PRINIVIL,ZESTRIL) 2.5 MG tablet Take 1 tablet (2.5 mg total) by mouth daily., Starting Thu 09/02/2016, Normal    mupirocin ointment (BACTROBAN) 2 % Apply 1 application topically daily as needed (to affected area on forehead). , Starting Fri 08/20/2016, Historical Med    nitroGLYCERIN (NITROSTAT) 0.4 MG SL tablet PLACE ONE TABLET UNDER THE TONGUE EVERY 5 MINUTES AS NEEDED FOR CHEST PAIN., Normal    pantoprazole (PROTONIX) 40 MG tablet Take 1 tablet (40 mg total) by mouth daily., Starting Mon 07/19/2016, Normal  Propylene Glycol (SYSTANE BALANCE) 0.6 % SOLN Apply 1-2 drops to eye 3 (three) times daily as needed (for dry eyes)., Historical Med    RANEXA 1000 MG SR tablet TAKE ONE TABLET BY MOUTH TWICE DAILY, Normal      STOP taking these medications     warfarin (COUMADIN) 4 MG tablet          Follow-up Information    Lavone Orn, MD. Schedule an appointment as soon as possible for a visit in 1 week(s).   Specialty:  Internal Medicine Why:  to discuss further options for rectal mass Contact information: [address] [place] [address] [phone]           TOTAL DISCHARGE TIME: 35 mins  [facility] Pager [phone]  10/09/2016, 4:06 PM

## 2016-10-15 ENCOUNTER — Encounter: Payer: Self-pay | Admitting: Physician Assistant

## 2016-10-15 ENCOUNTER — Ambulatory Visit (HOSPITAL_COMMUNITY): Payer: Medicare Other | Attending: Cardiovascular Disease

## 2016-10-15 ENCOUNTER — Other Ambulatory Visit: Payer: Self-pay

## 2016-10-15 ENCOUNTER — Telehealth: Payer: Self-pay | Admitting: *Deleted

## 2016-10-15 ENCOUNTER — Ambulatory Visit (INDEPENDENT_AMBULATORY_CARE_PROVIDER_SITE_OTHER): Payer: Medicare Other

## 2016-10-15 DIAGNOSIS — I5030 Unspecified diastolic (congestive) heart failure: Secondary | ICD-10-CM | POA: Diagnosis not present

## 2016-10-15 DIAGNOSIS — I493 Ventricular premature depolarization: Secondary | ICD-10-CM

## 2016-10-15 DIAGNOSIS — I350 Nonrheumatic aortic (valve) stenosis: Secondary | ICD-10-CM

## 2016-10-15 DIAGNOSIS — I060 Rheumatic aortic stenosis: Secondary | ICD-10-CM | POA: Diagnosis not present

## 2016-10-15 DIAGNOSIS — I420 Dilated cardiomyopathy: Secondary | ICD-10-CM | POA: Diagnosis not present

## 2016-10-15 DIAGNOSIS — I25119 Atherosclerotic heart disease of native coronary artery with unspecified angina pectoris: Secondary | ICD-10-CM | POA: Diagnosis not present

## 2016-10-15 NOTE — Telephone Encounter (Signed)
-----   Message from Liliane Shi, [place] sent at 10/15/2016  4:43 PM EDT ----- Please call the patient. The echocardiogram demonstrates that the ejection fraction is normal.  The aortic stenosis is somewhat worse from the last study but not bad enough to be the cause of his chest pain.   Continue current medications and follow up as planned. Arrange repeat Limited Echo in 6 mos to recheck EF and aortic valve gradients.  Please fax a copy of this study result to his PCP:  Lavone Orn, MD  Thanks! Richardson Dopp, PA-C    10/15/2016 4:40 PM

## 2016-10-15 NOTE — Telephone Encounter (Signed)
Pt has been notified of echo results and findings by phone with verbal understanding. Pt agreeable to repeat echo in 6 months. I will fax a copy of results to PCP. Pt thanked me for my call today.

## 2016-10-25 ENCOUNTER — Ambulatory Visit: Payer: Medicare Other | Admitting: Cardiovascular Disease

## 2016-11-18 ENCOUNTER — Emergency Department (HOSPITAL_COMMUNITY): Payer: Medicare Other

## 2016-11-18 ENCOUNTER — Encounter (HOSPITAL_COMMUNITY): Payer: Self-pay

## 2016-11-18 ENCOUNTER — Emergency Department (HOSPITAL_COMMUNITY)
Admission: EM | Admit: 2016-11-18 | Discharge: 2016-11-19 | Disposition: A | Discharge status: Home / Self Care | Payer: Medicare Other | Attending: Emergency Medicine | Admitting: Emergency Medicine

## 2016-11-18 DIAGNOSIS — Z87891 Personal history of nicotine dependence: Secondary | ICD-10-CM | POA: Insufficient documentation

## 2016-11-18 DIAGNOSIS — I2510 Atherosclerotic heart disease of native coronary artery without angina pectoris: Secondary | ICD-10-CM | POA: Insufficient documentation

## 2016-11-18 DIAGNOSIS — K648 Other hemorrhoids: Secondary | ICD-10-CM | POA: Insufficient documentation

## 2016-11-18 DIAGNOSIS — K625 Hemorrhage of anus and rectum: Secondary | ICD-10-CM | POA: Diagnosis present

## 2016-11-18 DIAGNOSIS — Z85038 Personal history of other malignant neoplasm of large intestine: Secondary | ICD-10-CM | POA: Insufficient documentation

## 2016-11-18 DIAGNOSIS — Z7982 Long term (current) use of aspirin: Secondary | ICD-10-CM | POA: Diagnosis not present

## 2016-11-18 DIAGNOSIS — M25512 Pain in left shoulder: Secondary | ICD-10-CM | POA: Diagnosis not present

## 2016-11-18 DIAGNOSIS — K644 Residual hemorrhoidal skin tags: Secondary | ICD-10-CM

## 2016-11-18 DIAGNOSIS — I110 Hypertensive heart disease with heart failure: Secondary | ICD-10-CM | POA: Insufficient documentation

## 2016-11-18 DIAGNOSIS — E119 Type 2 diabetes mellitus without complications: Secondary | ICD-10-CM | POA: Diagnosis not present

## 2016-11-18 DIAGNOSIS — Z79899 Other long term (current) drug therapy: Secondary | ICD-10-CM | POA: Insufficient documentation

## 2016-11-18 DIAGNOSIS — I5042 Chronic combined systolic (congestive) and diastolic (congestive) heart failure: Secondary | ICD-10-CM | POA: Insufficient documentation

## 2016-11-18 DIAGNOSIS — K6289 Other specified diseases of anus and rectum: Secondary | ICD-10-CM

## 2016-11-18 LAB — CBC
HEMATOCRIT: 33.5 % — AB (ref 39.0–52.0)
HEMOGLOBIN: 11.2 g/dL — AB (ref 13.0–17.0)
MCH: 35.1 pg — AB (ref 26.0–34.0)
MCHC: 33.4 g/dL (ref 30.0–36.0)
MCV: 105 fL — ABNORMAL HIGH (ref 78.0–100.0)
Platelets: 205 10*3/uL (ref 150–400)
RBC: 3.19 MIL/uL — AB (ref 4.22–5.81)
RDW: 13.7 % (ref 11.5–15.5)
WBC: 6.5 10*3/uL (ref 4.0–10.5)

## 2016-11-18 LAB — BASIC METABOLIC PANEL
ANION GAP: 7 (ref 5–15)
BUN: 24 mg/dL — ABNORMAL HIGH (ref 6–20)
CO2: 24 mmol/L (ref 22–32)
Calcium: 9.3 mg/dL (ref 8.9–10.3)
Chloride: 105 mmol/L (ref 101–111)
Creatinine, Ser: 1.51 mg/dL — ABNORMAL HIGH (ref 0.61–1.24)
GFR calc Af Amer: 45 mL/min — ABNORMAL LOW (ref >60)
GFR, EST NON AFRICAN AMERICAN: 39 mL/min — AB (ref >60)
GLUCOSE: 161 mg/dL — AB (ref 65–99)
POTASSIUM: 4.2 mmol/L (ref 3.5–5.1)
Sodium: 136 mmol/L (ref 135–145)

## 2016-11-18 MED ORDER — SODIUM CHLORIDE 0.9 % IV BOLUS (SEPSIS)
500.0000 mL | Freq: Once | INTRAVENOUS | Status: AC
  Administered 2016-11-19: 500 mL via INTRAVENOUS

## 2016-11-18 NOTE — ED Triage Notes (Addendum)
Per Pt, Pt is coming from home with complaints of left shoulder pain that started two weeks ago. Pt reports, "I woke up and my left shoulder was out of place. It has hurt ever since. " Pt reports that he has also been constipated with some GI bleeding for three weeks. While assessing pt, reports using a cane for six days because he feels 'wobbly." Reports dizziness. Stated he has fallen multiple times at home in the last two weeks. Denies head injury or other injuries.

## 2016-11-18 NOTE — ED Provider Notes (Signed)
[facility] DEPT Provider Note   CSN: [id number] Arrival date & time: 11/18/16  1745     History   Chief Complaint Chief Complaint  Patient presents with  . Constipation  . Shoulder Injury  . GI Bleeding    HPI [name] is a 83 y.o. male.  [name] is a 83 y.o. Male with history of CAD, hypertension, paroxysmal atrial fibrillation, diabetes, CHF, and colon cancer who presents to the emergency department complaining of bright red blood per rectum for several weeks. He reports he's been having loose red stools. He is unsure why he is having this. He tells me he is using prune juice to help with his bowel movements. His last bowel movement was 2 days ago. He denies any rectal pain. He cannot provide any history from his most recent admission in May where they found a bleeding mass in his colon. He is not on anticoagulants and longer. Per chart review patient is admitted in May of this year for bright red blood per rectum. He was discovered to have a friable mass in his colon that was biopsied during flexible sigmoidoscopy. He is unable to provide any of this history. Patient also reports feeling unsteady on his feet when using his cane in his left hand. He reports he has pain in his left shoulder that causes him to feel unsteady on his feet when using his cane. He reports having this pain for several months. He denies lightheaded or dizzy at this time. He reports decreased appetite and reports he's not been eating and drinking well recently. He denies fevers, abdominal pain, nausea, vomiting, rashes, urinary symptoms, difficulty urinating, chest pain, shortness of breath, dizziness or headache.   The history is provided by the patient and medical records. No language interpreter was used.  Constipation   Pertinent negatives include no abdominal pain and no dysuria.  Shoulder Injury  Pertinent negatives include no chest pain, no abdominal pain, no headaches and no  shortness of breath.    Past Medical History:  Diagnosis Date  . Aortic stenosis    a. Mild-mod by echo 2013.//Echo 6/18: EF 50-55, no RWMA, Gr 1 DD, mod AS (mean 20, peak 34), mod LAE, mild RAE  . Barrett esophagus   . Chronic anticoagulation   . Chronic systolic CHF (congestive heart failure) (HCC)    a. EF previously 35-40%. b. improved to 55-65% by cath 08/2013.  [name] Colon cancer ([place])    a. 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 b.  "graduated" from their practice 06/2013, remains free of any new disease per those notes.  . Coronary artery disease    a. s/p PCI w/ DES to mLAD 08/05/10. b. NSTEMI 08/2013 (mildly elev trop) - cath showing widely patent stent, 80% prox D1 (small vessel) with recommendation for medical management, residual 60% ostial PDA, <20% LCx, LVEF 55-65%.   . Depression   . Gallstone pancreatitis 2011   a. s/p chole.  [name] GERD (gastroesophageal reflux disease)   . Hyperlipidemia   . Hypertension   . Macrocytic anemia 06/25/2011  . Myelodysplastic disease ([place])    a. Suspected low-grade  myelodysplastic disease per onc notes.  . Other pancytopenia ([place]) 06/18/2014   Mild, fluctuating, likely med related. Rule out early MDS from prior chemo/RT or colon cancer  . PAF (paroxysmal atrial fibrillation) ([place])    a. failed TEE due to inability to pass probe into the esophagus in March 2013. b. On Coumadin. Was in  NS[facility] 08/2013 admission.  . Premature atrial contractions   . Premature ventricular contractions   . SBO (small bowel obstruction) ([place]) 2005  . Type II diabetes mellitus [facility])     Patient Active Problem List   Diagnosis Date Noted  . Aortic stenosis 10/15/2016  . Acute kidney injury ([place]) 10/06/2016  . Barrett esophagus 07/17/2016  . Encounter for therapeutic drug monitoring 05/24/2016  . Malnutrition of moderate degree 05/20/2016  . Hypertensive heart disease without heart failure   . Chronic diastolic heart failure ([place])   . Near syncope  11/03/2015  . Generalized weakness 11/03/2015  . Elevated INR 11/03/2015  . Hyperglycemia, unspecified 11/03/2015  . Anemia, unspecified 11/03/2015  . Chronic anticoagulation 11/03/2015  . Altered awareness, transient 11/03/2015  . Thrombocytopenia ([place]) 08/10/2015  . Non-insulin dependent type 2 diabetes mellitus ([place])   . Pain in the chest   . History of colon cancer, stage III 09/11/2014  . BRBPR (bright red blood per rectum) 09/11/2014  . Other pancytopenia ([place]) 06/18/2014  . History of non-ST elevation myocardial infarction (NSTEMI) 08/15/2013  . Chest pain 12/14/2012  . 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 Paroxysmal atrial fibrillation ([place]) 07/20/2011  . Cardiomyopathy ([place]) 07/20/2011  . SOB (shortness of breath) 07/17/2011  . Macrocytic anemia 06/25/2011  . Bruising 09/18/2010  . Coronary artery disease involving native coronary artery with angina pectoris ([place]) 08/13/2010  . Hypertension   . Hyperlipidemia     Past Surgical History:  Procedure Laterality Date  . ABDOMINAL MASS RESECTION     Mass near mesentery  . APPENDECTOMY     "took out during colon surgery"  . CARDIAC CATHETERIZATION  08/03/10  . CARDIAC CATHETERIZATION  08/09/10   LAD 30, stent OK, CFX 40, RCA < 20  . CARDIAC CATHETERIZATION N/A 05/20/2016   Procedure: Left Heart Cath and Coronary Angiography;  Surgeon: Lorretta Harp, MD;  Location: [facility];  Service: Cardiovascular;  Laterality: N/A;  . CATARACT EXTRACTION Bilateral   . CHOLECYSTECTOMY  2011  . COLON SURGERY    . CORONARY ANGIOPLASTY WITH STENT PLACEMENT  08/05/10   DES to the LAD  . CYSTOSCOPY     "with removal of kidney stone in office"  . CYSTOSCOPY WITH RETROGRADE PYELOGRAM, URETEROSCOPY AND STENT PLACEMENT Bilateral 05/18/2013   Procedure: CYSTOSCOPY WITH BILATERAL RETROGRADE PYELOGRAM, LEFT URETEROSCOPY AND LEFT STENT PLACEMENT;  Surgeon: Alexis Frock, MD;  Location: [facility];  Service:  Urology;  Laterality: Bilateral;  . FLEXIBLE SIGMOIDOSCOPY N/A 10/07/2016   Procedure: FLEXIBLE SIGMOIDOSCOPY;  Surgeon: Wilford Corner, MD;  Location: [facility]COPY;  Service: Endoscopy;  Laterality: N/A;  . LEFT HEART CATHETERIZATION WITH CORONARY ANGIOGRAM N/A 08/15/2013   Procedure: LEFT HEART CATHETERIZATION WITH CORONARY ANGIOGRAM;  Surgeon: Peter M Martinique, MD;  Location: [facility] CATH LAB;  Service: Cardiovascular;  Laterality: N/A;  . RIGHT COLECTOMY    . TONSILLECTOMY         Home Medications    Prior to Admission medications   Medication Sig Start Date End Date Taking? Authorizing Provider  aspirin EC 81 MG tablet Take 1 tablet (81 mg total) by mouth daily. 10/09/16  Yes Bonnielee Haff, MD  atorvastatin (LIPITOR) 40 MG tablet TAKE ONE TABLET BY MOUTH ONCE DAILY AT 6 PM 09/13/16  Yes Nahser, Wonda Cheng, MD  B Complex-C (B-COMPLEX WITH VITAMIN C) tablet Take 1 tablet by mouth daily.   Yes [provider]  carvedilol (COREG) 12.5 MG tablet Take 1.5  tablets (18.75 mg total) by mouth 2 (two) times daily. Patient taking differently: Take 12.5 mg by mouth 2 (two) times daily.  10/01/16 12/30/16 Yes Weaver, Scott T, PA-C  Cholecalciferol (VITAMIN D-3 PO) Take 1 tablet by mouth daily.    Yes [provider]  donepezil (ARICEPT) 10 MG tablet Take 10 mg by mouth at bedtime.  05/04/16  Yes [provider]  finasteride (PROSCAR) 5 MG tablet Take 5 mg by mouth at bedtime.  04/01/14  Yes [provider]  glimepiride (AMARYL) 1 MG tablet Take 1 mg by mouth daily.  05/29/16  Yes [provider]  isosorbide mononitrate (IMDUR) 120 MG 24 hr tablet Take 1 tablet (120 mg total) by mouth daily. 07/20/16  Yes Reyne Dumas, MD  linagliptin (TRADJENTA) 5 MG TABS tablet Take 1 tablet (5 mg total) by mouth daily. For blood sugar 11/04/15  Yes Johnson, Clanford L, MD  lisinopril (PRINIVIL,ZESTRIL) 2.5 MG tablet Take 1 tablet (2.5 mg total) by mouth daily. 09/02/16  Yes Reyne Dumas, MD  mupirocin ointment (BACTROBAN) 2 % Apply 1 application topically daily as needed (to affected area on forehead).  08/20/16  Yes [provider]  nitroGLYCERIN (NITROSTAT) 0.4 MG SL tablet PLACE ONE TABLET UNDER THE TONGUE EVERY 5 MINUTES AS NEEDED FOR CHEST PAIN. 08/16/16  Yes Nahser, Wonda Cheng, MD  pantoprazole (PROTONIX) 40 MG tablet Take 1 tablet (40 mg total) by mouth daily. 07/19/16  Yes Reyne Dumas, MD  Propylene Glycol (SYSTANE BALANCE) 0.6 % SOLN Apply 1-2 drops to eye 3 (three) times daily as needed (for dry eyes).   Yes [provider]  RANEXA 1000 MG SR tablet TAKE ONE TABLET BY MOUTH TWICE DAILY 08/20/16  Yes Nahser, Wonda Cheng, MD    Family History Family History  Problem Relation Age of Onset  . Cancer Mother   . Ulcers Father 49  . Heart attack Brother 39    Social History Social History  Substance Use Topics  . Smoking status: Former Smoker    Packs/day: 1.00    Years: 30.00    Types: Cigarettes    Quit date: 05/10/1994  . Smokeless tobacco: Never Used  . Alcohol use 1.2 oz/week    2 Shots of liquor per week     Comment: occasionally.     Allergies   Metformin and related   Review of Systems Review of Systems  Constitutional: Negative for chills and fever.  HENT: Negative for congestion and sore throat.   Eyes: Negative for visual disturbance.  Respiratory: Negative for cough, shortness of breath and wheezing.   Cardiovascular: Negative for chest pain and palpitations.  Gastrointestinal: Positive for anal bleeding, blood in stool and diarrhea. Negative for abdominal distention, abdominal pain, constipation, nausea and vomiting.  Genitourinary: Negative for dysuria.  Musculoskeletal: Positive for arthralgias. Negative for back pain and neck pain.  Skin: Negative for rash.  Neurological: Positive for light-headedness. Negative for dizziness, syncope, weakness, numbness and headaches.     Physical Exam Updated Vital Signs BP (!)  164/87   Pulse 75   Temp 97.7 F (36.5 C) (Oral)   Resp 19   Ht 5\' 9"  (1.753 m)   Wt 61.2 kg (135 lb)   SpO2 98%   BMI 19.94 kg/m   Physical Exam  Constitutional: He appears well-developed and well-nourished. No distress.  Nontoxic appearing.  HENT:  Head: Normocephalic and atraumatic.  Mouth/Throat: Oropharynx is clear and moist.  Eyes: Pupils are equal, round, and reactive  to light. Conjunctivae and EOM are normal. Right eye exhibits no discharge. Left eye exhibits no discharge.  Neck: Neck supple.  Cardiovascular: Normal rate, regular rhythm, normal heart sounds and intact distal pulses.  Exam reveals no gallop and no friction rub.   No murmur heard. Pulmonary/Chest: Effort normal and breath sounds normal. No respiratory distress. He has no wheezes. He has no rales.  Lungs are clear to ascultation bilaterally. Symmetric chest expansion bilaterally. No increased work of breathing. No rales or rhonchi.    Abdominal: Soft. Bowel sounds are normal. There is no tenderness. There is no guarding.  Abdomen is soft and nontender to palpation.  Genitourinary:  Genitourinary Comments: GU exam with male RN chaperone. External hemorrhoids noted.  Musculoskeletal: Normal range of motion. He exhibits no edema, tenderness or deformity.  Patient has good range of motion of his bilateral shoulders without difficulty.  Lymphadenopathy:    He has no cervical adenopathy.  Neurological: He is alert. No cranial nerve deficit or sensory deficit. He exhibits normal muscle tone. Coordination normal.  Alert and oriented to person, place and time. He has trouble with remembering some recent events during interview.   Skin: Skin is warm and dry. Capillary refill takes less than 2 seconds. No rash noted. He is not diaphoretic. No erythema. No pallor.  Psychiatric: He has a normal mood and affect. His behavior is normal.  Nursing note and vitals reviewed.    ED Treatments / Results  Labs (all labs  ordered are listed, but only abnormal results are displayed) Labs Reviewed  BASIC METABOLIC PANEL - Abnormal; Notable for the following:       Result Value   Glucose, Bld 161 (*)    BUN 24 (*)    Creatinine, Ser 1.51 (*)    GFR calc non Af Amer 39 (*)    GFR calc Af Amer 45 (*)    All other components within normal limits  CBC - Abnormal; Notable for the following:    RBC 3.19 (*)    Hemoglobin 11.2 (*)    HCT 33.5 (*)    MCV 105.0 (*)    MCH 35.1 (*)    All other components within normal limits  URINALYSIS, ROUTINE W REFLEX MICROSCOPIC  CBG MONITORING, ED    EKG  EKG Interpretation  Date/Time:  Thursday November 18 2016 18:25:39 EDT Ventricular Rate:  81 PR Interval:  158 QRS Duration: 138 QT Interval:  420 QTC Calculation: 487 R Axis:   107 Text Interpretation:  Sinus rhythm with frequent Premature ventricular complexes and Premature atrial complexes Right bundle branch block Abnormal ECG No significant change since last tracing Confirmed by Pattricia Boss [phone]) on 11/18/2016 10:57:47 PM       Radiology Dg Shoulder Left  Result Date: 11/18/2016 CLINICAL DATA:  Left shoulder pain. EXAM: LEFT SHOULDER - 2+ VIEW COMPARISON:  Radiographs April 2017, MRI May 2017 FINDINGS: There is no evidence of fracture or dislocation. Acromioclavicular osteoarthritis, unchanged. Minimal subcortical irregularity of the greater tuberosity consistent with rotator cuff pathology. Soft tissues are unremarkable. IMPRESSION: Acromioclavicular osteoarthritis.  No acute osseous abnormality. Electronically Signed   By: Jeb Levering M.D.   On: 11/18/2016 22:46    Procedures Procedures (including critical care time)  Medications Ordered in ED Medications  sodium chloride 0.9 % bolus 500 mL (0 mLs Intravenous Stopped 11/19/16 0058)     Initial Impression / Assessment and Plan / ED Course  I have reviewed the triage vital signs and the nursing  notes.  Pertinent labs & imaging results that were  available during my care of the patient were reviewed by me and considered in my medical decision making (see chart for details).     This is a 83 y.o. Male with history of CAD, hypertension, paroxysmal atrial fibrillation, diabetes, CHF, and colon cancer who presents to the emergency department complaining of bright red blood per rectum for several weeks. He reports he's been having loose red stools. He is unsure why he is having this. He tells me he is using prune juice to help with his bowel movements. His last bowel movement was 2 days ago. He denies any rectal pain. He cannot provide any history from his most recent admission in May where they found a bleeding mass in his colon. He is not on anticoagulants and longer. Per chart review patient is admitted in May of this year for bright red blood per rectum. He was discovered to have a friable mass in his colon that was biopsied during flexible sigmoidoscopy. He is unable to provide any of this history. Patient also reports feeling unsteady on his feet when using his cane in his left hand. He reports he has pain in his left shoulder that causes him to feel unsteady on his feet when using his cane. He reports he has had this shoulder pain for several months.  Patient also tells me he drove to the emergency department. He lives alone in a [place]. On exam the patient is afebrile nontoxic appearing. His abdomen is soft and nontender to palpation. On rectal exam he has an obvious external hemorrhoid. Blood work shows a hemoglobin that is stable and at his baseline at 11.2. BMP reveals a mildly elevated creatinine 1.51. Fluid bolus provided. Urinalysis shows infection. X-ray of his left shoulder shows arthritis. Throughout the patient's stay he has difficulty remembering his previous hospital visits. He tells me he does not remember staying in the hospital back in May. He does not remember this rectal mass. I suspect this is the cause of his bright red blood per  rectum. He reports he last had any bloody bowel movement yesterday. He is hemodynamically stable. He is very adamant about wanting to go home today. It appears the patient is due to follow-up with GI. Patient appears to have not followed up with GI at this point. I called and consulted with gastroenterologist Dr. Maud Deed. She will have her office call the patient tomorrow to make an appointment for follow-up. I extensively discussed instructions the patient and provided him with written instructions. He agrees and is agreeable with plan for discharge and follow-up as an outpatient. He is still very adamant about wanting to drive home tonight. I advised the patient to follow-up with their primary care provider this week. I advised the patient to return to the emergency department with new or worsening symptoms or new concerns. The patient verbalized understanding and agreement with plan.   This patient was discussed with and evaluated by Dr. Jeanell Sparrow who agrees with assessment and plan.     Final Clinical Impressions(s) / ED Diagnoses   Final diagnoses:  Bright red blood per rectum  Rectal mass  Acute pain of left shoulder  External hemorrhoid    New Prescriptions New Prescriptions   No medications on file     Waynetta Pean, Hershal Coria 11/19/16 5009    Pattricia Boss, MD 11/19/16 1438

## 2016-11-18 NOTE — ED Notes (Signed)
Pt transported to Xray. 

## 2016-11-19 LAB — URINALYSIS, ROUTINE W REFLEX MICROSCOPIC
Bilirubin Urine: NEGATIVE
Glucose, UA: NEGATIVE mg/dL
Hgb urine dipstick: NEGATIVE
Ketones, ur: NEGATIVE mg/dL
Leukocytes, UA: NEGATIVE
Nitrite: NEGATIVE
Protein, ur: NEGATIVE mg/dL
Specific Gravity, Urine: 1.012 (ref 1.005–1.030)
pH: 6 (ref 5.0–8.0)

## 2016-11-25 ENCOUNTER — Encounter: Payer: Self-pay | Admitting: Cardiovascular Disease

## 2016-11-25 ENCOUNTER — Ambulatory Visit (INDEPENDENT_AMBULATORY_CARE_PROVIDER_SITE_OTHER): Payer: Medicare Other | Admitting: Cardiovascular Disease

## 2016-11-25 VITALS — BP 120/60 | HR 62 | Ht 69.0 in | Wt 141.1 lb

## 2016-11-25 DIAGNOSIS — I480 Paroxysmal atrial fibrillation: Secondary | ICD-10-CM

## 2016-11-25 DIAGNOSIS — C189 Malignant neoplasm of colon, unspecified: Secondary | ICD-10-CM

## 2016-11-25 DIAGNOSIS — I2510 Atherosclerotic heart disease of native coronary artery without angina pectoris: Secondary | ICD-10-CM | POA: Diagnosis not present

## 2016-11-25 DIAGNOSIS — C772 Secondary and unspecified malignant neoplasm of intra-abdominal lymph nodes: Secondary | ICD-10-CM | POA: Diagnosis not present

## 2016-11-25 NOTE — Patient Instructions (Addendum)
Medication Instructions:  Your physician recommends that you continue on your current medications as directed. Please refer to the Current Medication list given to you today. **Call Sharyn Lull to report doses of Amlodipine and Carvedilol  Labwork: None Ordered   Testing/Procedures: None Ordered   Follow-Up: Your physician recommends that you schedule a follow-up appointment in: 3 months with Dr. [name]   If you need a refill on your cardiac medications before your next appointment, please call your pharmacy.   Thank you for choosing [facility]! Christen Bame, RN [phone]

## 2016-11-25 NOTE — Progress Notes (Signed)
Cardiology Office Note   Date:  11/25/2016   ID:  [name], DOB 11-04-1923, MRN [id number]  PCP:  Lavone Orn, MD  Cardiologist:   Mertie Moores, MD   Chief Complaint  Patient presents with  . Coronary Artery Disease   1. CAD - s/p stenting July 2012.  2. Hypertension  3. Hyperlipidemia  4. Diabetes Mellitus  5. Paroxysmal atrial fib.   6. Lower GI bleed -   previous notes:   [name] is seen today for a 2 week check. He has known CAD with prior PCI to the LAD last year. Other issues include chronic PVC's and PACs, HTN, DM, HLD and systolic heart failure. He now has atrial fib and will be managed with rate control and anticoagulation. He had a failed attempt at [place] in March due to inability to pass the probe into the esophagus. His EF is 35 to 40% with mild to moderate AS.  His INR has been therapeutic. He really is not interested in doing cardioversion.  December 23, 2011 - he was initially seen in the emergency room. He has progressive back pain associated with increasing shortness of breath. [name] noticed that if he takes a nitroglycerin at this back pain and his shortness of breath improved. He has these discomforts with minor levels of exertion. He is also having at rest.  March 20, 2012: [name] presented with some episodes of chest pain when I last saw him in August. He had a stress Myoview study that was normal. He still walks almost every day - he goes around [employer] 1-1 1/2 miles a day. He had a brief episode of CP yesterday - relieved with NTG. He had some food poisoning recently ( raw oysters)  Oct 03, 2012:  He has been having some mid abdominal pain with radiation around to the back. He took a NTG with relief. The pain was a dull pain and would last 30 minutes or so. He has been putting in his garden without any without any problems.   Oct. 1, 2014:  [name] is doing ok. He is having problems with his memory. Has trouble getting  the words out sometimes. No CVA symptoms. No focal neuro findings.  No  CP or dyspnea. Still working out in his garden and with the wildlife club at [facility].   August 10, 2013:  Linn has been having some chest tightness . Occurs 1-2 times a week. The chest tightness is relieved if he takes a nitroglycerin. he also crushes up a full strength aspirin and takes that. The pains occur with rest. Not associated with eating Or drinking anything. Similar to his previous epidoses of CP but are not as severe.  December 24, 2013:  [name] has been hospitalized since I last saw him. He had some chest pain and a followup cardiac catheterization revealed that his stents were okay. [name] been on Ranexa but complains of the cost is a bit high. The Ranexa seems to be helping quite a bit.    Feb. 18, 2016:    [name] is a 83 y.o. male who presents for follow up of his CAD Fells great.  Has been feeling better on the Ranexa.  Wants to cut the Ranexa to every other day.   Enjoying life.  Not exercising much these days.  Gets his INR levels drawn at his primary medical doctors office.   Sept. 23, 2016: [name] is feeling great. No CP No dyspnea  BP has  been well controlled   Had some bloody stools  - has resolved.   July 30, 2015 Overall doing the same Has occasional CP , takes SL NTG once a week .  Seems to help Does not occur with exertion  Has not been walking much recently  -   October 10, 2015:  Doing ok. Having some left sided chest pain .   Has taken some SL NTG. Also has had some unrelated shoulder pain  Takes the SL NTG almost daily .  Is on Imdur 90 mg a day  And Ranexa 1000  mg BID   Not necessarily due to exertion.     Oct. 16, 2017  Overall doing well. Has some occasional CP, is on Ranexa Is in the doughnut hole and cannot afford it  Does not have to take SL NTG   August 03, 2016:  [name] is seen today  [name] had a heart catheterization in  January which showed   The left ventricular systolic function is normal.  LV end diastolic pressure is normal.  Prox LAD to Mid LAD lesion, 0 %stenosed.  2nd Diag lesion, 80 %stenosed.  Ost RCA lesion, 50 %stenosed.   His isosorbide was supposed to be increased. He was seen again on March 12 and the hospital and it appeared that he had not increased his isosorbide. It was recommended that he increase his isosorbide at that time.  Still having intermittent chest pain  Has episodes of weakness.  Takes NTG on occasion - this seems to relieve the CP  Still lives independently .  Able to do his usual chores.  Complains of general weakness  November 25, 2016:  [name] has been in the hospital several times since I last saw him in March. In April, he was admitted with worsening chest pain. Stress Myoview study at that time revealed an inferior defect consistent with previous inferior wall myocardial infarction. Ejection fraction was 48%.  He has a history of paroxysmal atrial fibrillation Hip. Sleeping on Coumadin. He has been admitted to the hospital on May 31 with GI bleeding He had a flex sig that was concerning for malignancy.  He was found have colon cancer which had metastasized to lymph nodes.  He potentially will need hemicolectomy .   He has had rare episodes of CP -  Able to walk without CP - he is unsteady on his feet     Past Medical History:  Diagnosis Date  . Aortic stenosis    a. Mild-mod by echo 2013.//Echo 6/18: EF 50-55, no RWMA, Gr 1 DD, mod AS (mean 20, peak 34), mod LAE, mild RAE  . Barrett esophagus   . Chronic anticoagulation   . Chronic systolic CHF (congestive heart failure) (HCC)    a. EF previously 35-40%. b. improved to 55-65% by cath 08/2013.  [name] Colon cancer ([place])    a. 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 b.  "graduated" from their practice 06/2013, remains free of any new disease per those notes.  . Coronary artery disease    a. s/p PCI w/  DES to mLAD 08/05/10. b. NSTEMI 08/2013 (mildly elev trop) - cath showing widely patent stent, 80% prox D1 (small vessel) with recommendation for medical management, residual 60% ostial PDA, <20% LCx, LVEF 55-65%.   . Depression   . Gallstone pancreatitis 2011   a. s/p chole.  [name] GERD (gastroesophageal reflux disease)   . Hyperlipidemia   . Hypertension   . Macrocytic anemia 06/25/2011  .  Myelodysplastic disease ([place])    a. Suspected low-grade  myelodysplastic disease per onc notes.  . Other pancytopenia ([place]) 06/18/2014   Mild, fluctuating, likely med related. Rule out early MDS from prior chemo/RT or colon cancer  . PAF (paroxysmal atrial fibrillation) ([place])    a. failed TEE due to inability to pass probe into the esophagus in March 2013. b. On Coumadin. Was in NSR 08/2013 admission.  . Premature atrial contractions   . Premature ventricular contractions   . SBO (small bowel obstruction) ([place]) 2005  . Type II diabetes mellitus ([place])     Past Surgical History:  Procedure Laterality Date  . ABDOMINAL MASS RESECTION     Mass near mesentery  . APPENDECTOMY     "took out during colon surgery"  . CARDIAC CATHETERIZATION  08/03/10  . CARDIAC CATHETERIZATION  08/09/10   LAD 30, stent OK, CFX 40, RCA < 20  . CARDIAC CATHETERIZATION N/A 05/20/2016   Procedure: Left Heart Cath and Coronary Angiography;  Surgeon: Lorretta Harp, MD;  Location: [facility];  Service: Cardiovascular;  Laterality: N/A;  . CATARACT EXTRACTION Bilateral   . CHOLECYSTECTOMY  2011  . COLON SURGERY    . CORONARY ANGIOPLASTY WITH STENT PLACEMENT  08/05/10   DES to the LAD  . CYSTOSCOPY     "with removal of kidney stone in office"  . CYSTOSCOPY WITH RETROGRADE PYELOGRAM, URETEROSCOPY AND STENT PLACEMENT Bilateral 05/18/2013   Procedure: CYSTOSCOPY WITH BILATERAL RETROGRADE PYELOGRAM, LEFT URETEROSCOPY AND LEFT STENT PLACEMENT;  Surgeon: Alexis Frock, MD;  Location: [facility];  Service: Urology;  Laterality: Bilateral;    . FLEXIBLE SIGMOIDOSCOPY N/A 10/07/2016   Procedure: FLEXIBLE SIGMOIDOSCOPY;  Surgeon: Wilford Corner, MD;  Location: [facility] ENDOSCOPY;  Service: Endoscopy;  Laterality: N/A;  . LEFT HEART CATHETERIZATION WITH CORONARY ANGIOGRAM N/A 08/15/2013   Procedure: LEFT HEART CATHETERIZATION WITH CORONARY ANGIOGRAM;  Surgeon: Peter M Martinique, MD;  Location: [facility] CATH LAB;  Service: Cardiovascular;  Laterality: N/A;  . RIGHT COLECTOMY    . TONSILLECTOMY       Current Outpatient Prescriptions  Medication Sig Dispense Refill  . aspirin EC 81 MG tablet Take 1 tablet (81 mg total) by mouth daily. 30 tablet 0  . atorvastatin (LIPITOR) 40 MG tablet TAKE ONE TABLET BY MOUTH ONCE DAILY AT 6 PM 90 tablet 3  . B Complex-C (B-COMPLEX WITH VITAMIN C) tablet Take 1 tablet by mouth daily.    . carvedilol (COREG) 12.5 MG tablet Take 1.5 tablets (18.75 mg total) by mouth 2 (two) times daily. (Patient taking differently: Take 12.5 mg by mouth 2 (two) times daily. ) 270 tablet 3  . Cholecalciferol (VITAMIN D-3 PO) Take 1 tablet by mouth daily.     [name] donepezil (ARICEPT) 10 MG tablet Take 10 mg by mouth at bedtime.     . finasteride (PROSCAR) 5 MG tablet Take 5 mg by mouth at bedtime.     [name] glimepiride (AMARYL) 1 MG tablet Take 1 mg by mouth daily.     . isosorbide mononitrate (IMDUR) 120 MG 24 hr tablet Take 1 tablet (120 mg total) by mouth daily. 30 tablet 1  . linagliptin (TRADJENTA) 5 MG TABS tablet Take 1 tablet (5 mg total) by mouth daily. For blood sugar 30 tablet 1  . lisinopril (PRINIVIL,ZESTRIL) 2.5 MG tablet Take 1 tablet (2.5 mg total) by mouth daily. 30 tablet 2  . mupirocin ointment (BACTROBAN) 2 % Apply 1 application topically daily as needed (to affected area  on forehead).     . nitroGLYCERIN (NITROSTAT) 0.4 MG SL tablet PLACE ONE TABLET UNDER THE TONGUE EVERY 5 MINUTES AS NEEDED FOR CHEST PAIN. 25 tablet 23  . pantoprazole (PROTONIX) 40 MG tablet Take 1 tablet (40 mg total) by mouth daily. 30 tablet 1  .  Propylene Glycol (SYSTANE BALANCE) 0.6 % SOLN Apply 1-2 drops to eye 3 (three) times daily as needed (for dry eyes).    . RANEXA 1000 MG SR tablet TAKE ONE TABLET BY MOUTH TWICE DAILY 180 tablet 3   No current facility-administered medications for this visit.     Allergies:   Metformin and related    Social History:  The patient  reports that he quit smoking about 22 years ago. His smoking use included Cigarettes. He has a 30.00 pack-year smoking history. He has never used smokeless tobacco. He reports that he drinks about 1.2 oz of alcohol per week . He reports that he does not use drugs.   Family History:  The patient's family history includes Cancer in his mother; Heart attack (age of onset: 23) in his brother; Ulcers (age of onset: 16) in his father.    ROS:  Please see the history of present illness.    Review of Systems: Constitutional:  denies fever, chills, diaphoresis, appetite change and fatigue.  HEENT: denies photophobia, eye pain, redness, hearing loss, ear pain, congestion, sore throat, rhinorrhea, sneezing, neck pain, neck stiffness and tinnitus.  Respiratory: denies SOB, DOE, cough, chest tightness, and wheezing.  Cardiovascular: denies chest pain, palpitations and leg swelling.  Gastrointestinal: denies nausea, vomiting, abdominal pain, diarrhea, constipation, blood in stool.  Genitourinary: denies dysuria, urgency, frequency, hematuria, flank pain and difficulty urinating.  Musculoskeletal: denies  myalgias, back pain, joint swelling, arthralgias and gait problem.   Skin: denies pallor, rash and wound.  Neurological: denies dizziness, seizures, syncope, weakness, light-headedness, numbness and headaches.   Hematological: denies adenopathy, easy bruising, personal or family bleeding history.  Psychiatric/ Behavioral: denies suicidal ideation, mood changes, confusion, nervousness, sleep disturbance and agitation.       All other systems are reviewed and negative.     PHYSICAL EXAM: VS:  BP 120/60 (BP Location: Right Arm, Patient Position: Sitting, Cuff Size: Normal)   Pulse 62   Ht 5\' 9"  (1.753 m)   Wt 141 lb 1.9 oz (64 kg)   SpO2 96%   BMI 20.84 kg/m  , BMI Body mass index is 20.84 kg/m. GEN: Well nourished, well developed, in no acute distress  HEENT: normal  Neck: no JVD, soft bilateral carotid bruit  , no masses Cardiac: RR  Frequent premature beats. ;  Soft systolic  murmur, rubs, or gallops,no edema , good radial pulses.  Respiratory:  clear to auscultation bilaterally,   GI: soft, nontender, nondistended, + BS MS: no deformity or atrophy  Skin: warm and dry, no rash Neuro:  Strength and sensation are intact Psych: normal   EKG:  EKG is not ordered today.   Recent Labs: 05/20/2016: TSH 2.691 09/02/2016: ALT 16 10/01/2016: Magnesium 1.9 11/18/2016: BUN 24; Creatinine, Ser 1.51; Hemoglobin 11.2; Platelets 205; Potassium 4.2; Sodium 136    Lipid Panel    Component Value Date/Time   CHOL 122 05/20/2016 0101   TRIG 70 05/20/2016 0101   HDL 48 05/20/2016 0101   CHOLHDL 2.5 05/20/2016 0101   VLDL 14 05/20/2016 0101   LDLCALC 60 05/20/2016 0101      Wt Readings from Last 3 Encounters:  11/25/16 141 lb 1.9  oz (64 kg)  11/18/16 135 lb (61.2 kg)  10/09/16 144 lb (65.3 kg)      Other studies Reviewed: Additional studies/ records that were reviewed today include: . Review of the above records demonstrates:    ASSESSMENT AND PLAN:  1. CAD - s/p stenting July 2012.  - he continues to have angina.   Has has diffuse irregularities as well as several stents.    On Imdur 120 mg a day and Ranexa.  Takes NTG on occasion      2. Hypertension - pressure is well-controlled. Continue same meds.   3. Hyperlipidemia - stable   4. Diabetes Mellitus   5. Colon cancer, he has been found to have recurrent colon cancer with metastases. He will go potentially need a hemicolectomy. He would be at some increased risk because of his  history of coronary artery disease and episodes of angina. The last Myoview study in April , 2018 showed EF of 48% and no ischemia.  Previous Inf.  MI  Given his age, his of CAD and episodes of angina, He is at moderate risk of CV complications for colon surgery .  He did not have demonstrable ischemia on the Myoview study in April. His left ventricle systolic function is mildly depressed.  6. Left carotid bruit .   Carotid doppler - shows bilateral mild Carotid disease. No further eval at this point   Current medicines are reviewed at length with the patient today.  The patient does not have concerns regarding medicines.  The following changes have been made:  no change   Disposition:   FU with me in 3 months      Mertie Moores, MD  11/25/2016 12:00 PM    [address], [place], [place]  [address] Phone: [phone]; Fax: [phone]

## 2016-11-25 NOTE — Addendum Note (Signed)
Addended by: Emmaline Life on: 11/25/2016 01:14 PM   Modules accepted: Orders

## 2016-12-06 ENCOUNTER — Other Ambulatory Visit: Payer: Self-pay | Admitting: General Surgery

## 2016-12-06 NOTE — H&P (Signed)
History of Present Illness [name] Ruff MD; 1/61/0960 2:45 PM) The patient is a 83 year old male who presents with a colorectal polyp. 83 year old male who underwent flexible sigmoidoscopy in May for rectal bleeding. He was noted to have a mass in the distal rectum which was biopsied. Pathology shows tubulovillous adenoma. There was concern that some of the mass was more firm and fixed underlying tissues. He continues to have rectal bleeding on a daily basis. He also complains of worsening constipation. He has tried MiraLAX Empirin juice with no lasting success. He states he always feels some urgency but is unable to have bowel movements.   Past Surgical History Malachy Moan, [place]; 12/06/2016 2:24 PM) Colon Removal - Partial  Allergies Malachy Moan, [place]; 12/06/2016 2:25 PM) No Known Allergies 12/06/2016  Medication History Malachy Moan, [place]; 12/06/2016 2:26 PM) AmLODIPine Besylate (2.5MG  Tablet, Oral) Active. Atorvastatin Calcium (40MG  Tablet, Oral) Active. Carvedilol (6.25MG  Tablet, Oral) Active. Donepezil HCl (10MG  Tablet, Oral) Active. Glimepiride (1MG  Tablet, Oral) Active. Isosorbide Mononitrate ER (60MG  Tablet ER 24HR, Oral) Active. Lisinopril (5MG  Tablet, Oral) Active. Nitroglycerin (0.4MG  Tab Sublingual, Sublingual) Active. Pantoprazole Sodium (40MG  Tablet DR, Oral) Active. Ranexa (1000MG  Tablet ER 12HR, Oral) Active. Warfarin Sodium (4MG  Tablet, Oral) Active. Medications Reconciled  Social History Malachy Moan, [place]; 12/06/2016 2:24 PM) Alcohol use Occasional alcohol use. Caffeine use Coffee, Tea. No drug use Tobacco use Former smoker.  Family History Malachy Moan, [place]; 12/06/2016 2:24 PM) Heart Disease Brother.  Other Problems Malachy Moan, [place]; 12/06/2016 2:24 PM) Chest pain Colon Cancer Congestive Heart Failure Heart murmur High blood pressure Inguinal Hernia Kidney Stone Myocardial  infarction     Review of Systems Malachy Moan RMA; 12/06/2016 2:25 PM) General Present- Fatigue and Weight Loss. Not Present- Appetite Loss, Chills, Fever, Night Sweats and Weight Gain. Skin Not Present- Change in Wart/Mole, Dryness, Hives, Jaundice, New Lesions, Non-Healing Wounds, Rash and Ulcer. HEENT Present- Seasonal Allergies. Not Present- Earache, Hearing Loss, Hoarseness, Nose Bleed, Oral Ulcers, Ringing in the Ears, Sinus Pain, Sore Throat, Visual Disturbances, Wears glasses/contact lenses and Yellow Eyes. Respiratory Not Present- Bloody sputum, Chronic Cough, Difficulty Breathing, Snoring and Wheezing. Breast Not Present- Breast Mass, Breast Pain, Nipple Discharge and Skin Changes. Cardiovascular Present- Chest Pain. Not Present- Difficulty Breathing Lying Down, Leg Cramps, Palpitations, Rapid Heart Rate, Shortness of Breath and Swelling of Extremities. Gastrointestinal Present- Bloody Stool, Change in Bowel Habits and Rectal Pain. Not Present- Abdominal Pain, Bloating, Chronic diarrhea, Constipation, Difficulty Swallowing, Excessive gas, Gets full quickly at meals, Hemorrhoids, Indigestion, Nausea and Vomiting. Male Genitourinary Not Present- Blood in Urine, Change in Urinary Stream, Frequency, Impotence, Nocturia, Painful Urination, Urgency and Urine Leakage. Musculoskeletal Present- Back Pain. Not Present- Joint Pain, Joint Stiffness, Muscle Pain, Muscle Weakness and Swelling of Extremities. Psychiatric Not Present- Anxiety, Bipolar, Change in Sleep Pattern, Depression, Fearful and Frequent crying.  Vitals Malachy Moan RMA; 12/06/2016 2:26 PM) 12/06/2016 2:26 PM Weight: 144.6 lb Height: 69in Body Surface Area: 1.8 m Body Mass Index: 21.35 kg/m  Temp.: 97.72F  Pulse: 51 (Regular)  BP: 112/60 (Sitting, Left Arm, Standard)      Physical Exam [name] Ruff MD; 4/54/0981 2:55 PM)  General Mental Status-Alert. General Appearance-Not in acute  distress. Build & Nutrition-Well nourished. Posture-Normal posture. Gait-Normal.  Head and Neck Head-normocephalic, atraumatic with no lesions or palpable masses. Trachea-midline.  Chest and Lung Exam Chest and lung exam reveals -on auscultation, normal breath sounds, no adventitious sounds and normal vocal resonance.  Cardiovascular Cardiovascular examination reveals -normal heart sounds,  regular rate and rhythm with no murmurs and no digital clubbing, cyanosis, edema, increased warmth or tenderness.  Abdomen Inspection Inspection of the abdomen reveals - No Hernias. Palpation/Percussion Palpation and Percussion of the abdomen reveal - Soft, Non Tender, No Rigidity (guarding), No hepatosplenomegaly and No Palpable abdominal masses.  Rectal Note: Partially circumferential mass affixed to sphincter complex, tender to palpation, friable  Neurologic Neurologic evaluation reveals -alert and oriented x 3 with no impairment of recent or remote memory, normal attention span and ability to concentrate, normal sensation and normal coordination.  Musculoskeletal Normal Exam - Bilateral-Upper Extremity Strength Normal and Lower Extremity Strength Normal.    Assessment & Plan [name] Ruff MD; 1/77/9390 2:56 PM)  RECTAL MASS (K62.9) Impression: Patient has a large distal rectal mass invading his anal sphincters almost circumferentially. He has coronary artery disease and aortic stenosis. We will discuss his risks with his cadiologist. I have recommended to begin with and examined under anesthesia and repeat biopsies to better stage his presumed rectal cancer. At that point we can discuss whether APR or diverting colostomy would be the best treatment.  He does have a history of metastatic colon cancer and it appears that this disease was resected.

## 2016-12-07 ENCOUNTER — Encounter (HOSPITAL_BASED_OUTPATIENT_CLINIC_OR_DEPARTMENT_OTHER): Payer: Self-pay | Admitting: *Deleted

## 2016-12-08 ENCOUNTER — Telehealth: Payer: Self-pay | Admitting: Nurse Practitioner

## 2016-12-08 ENCOUNTER — Encounter (HOSPITAL_BASED_OUTPATIENT_CLINIC_OR_DEPARTMENT_OTHER): Payer: Self-pay | Admitting: *Deleted

## 2016-12-08 NOTE — Telephone Encounter (Signed)
Cardiac clearance signed by Dr. [name] and placed in HIM to be faxed to [phone], ATTN:  Mammie Lorenzo, LPN. Patient's coumadin is managed by Dr. Laurann Montana, PCP

## 2016-12-08 NOTE — Progress Notes (Addendum)
NPO AFTER MN W/ EXCEPTION WATER UNTIL 0730.  ARRIVE AT 1145.  CURRENT LAB RESULTS AND EKG IN CHART AND EPIC.  WILL TAKE AM MEDS W/ EXCEPTION NO DIABETIC MEDICATION DOS W/ SIPS OF WATER ,  PT VERBALIZED UNDERSTANDING AND STATED HE WROTE  INSTRUCTIONS DOWN.  ASKED PT TO BRING HIS LIST MEDICATION DOS , WE NEED TO COMPARE AND MAKE SURE IT IS THE SAME. PT CARDIOLOGIST NOTE FROM 11-25-2016 HAD LISINOPRIL 2.5MG  , PT DENIED AND DR NASHER HAD DECREASED PT'S COREG TO 6.25MG  BID , BUT PT STILL HAS HIS 12.5MG  TABLET'S.  HE STATED UNDERSTOOD THAT DR NASHER DECREASED COREG AND COULD HALF THAT 12.5MG  AND TAKE HALF TABLE TWICE DAILY. BUT I AM NOT SURE IF PT REALLY UNDERSTOOD, HE WAS FRUSTRATED .  CALLED DR Craig PHONE AND SPOKE W/ NURSE MICHELLE TO GET WHOM PT LISTED ON HIS HIPPA .  MIICHELLE GAVE BE PT'S SISTER , BARBARA CREWS, AND #.  CALLED PT SISTER VIA PHONE.  TOLD HER ABOUT MY CONCERNS ABOUT HIS MEDICATION'S.  SHE VERBALIZED PT WAS A "HARD HEADED BACHELOR AND CAN BE FRUSTRATING".  PT SISTER WILL GO SEE PT AND MAKE SURE HE IS TAKING MEDICATION CORRECTLY.  SISTER STATED SHE BE WITH DOS. SO GAVE HER PT INSTRUCTIONS AND DIRECTION WHERE WE ARE LOCATED.     ADDENDUM:  REVIEWED CHART W/ DR MOSER MDA, FACE TO FACE.  DR MOSER STATED OK TO PROCEED.

## 2016-12-16 ENCOUNTER — Ambulatory Visit (HOSPITAL_BASED_OUTPATIENT_CLINIC_OR_DEPARTMENT_OTHER): Payer: Medicare Other | Admitting: Anesthesiology

## 2016-12-16 ENCOUNTER — Encounter (HOSPITAL_BASED_OUTPATIENT_CLINIC_OR_DEPARTMENT_OTHER)
Admission: RE | Disposition: A | Discharge status: Home / Self Care | Payer: Self-pay | Source: Ambulatory Visit | Attending: General Surgery

## 2016-12-16 ENCOUNTER — Ambulatory Visit (HOSPITAL_BASED_OUTPATIENT_CLINIC_OR_DEPARTMENT_OTHER)
Admission: RE | Admit: 2016-12-16 | Discharge: 2016-12-16 | Disposition: A | Discharge status: Home / Self Care | Payer: Medicare Other | Source: Ambulatory Visit | Attending: General Surgery | Admitting: General Surgery

## 2016-12-16 ENCOUNTER — Encounter (HOSPITAL_BASED_OUTPATIENT_CLINIC_OR_DEPARTMENT_OTHER): Payer: Self-pay | Admitting: *Deleted

## 2016-12-16 DIAGNOSIS — I2510 Atherosclerotic heart disease of native coronary artery without angina pectoris: Secondary | ICD-10-CM | POA: Diagnosis not present

## 2016-12-16 DIAGNOSIS — Z7901 Long term (current) use of anticoagulants: Secondary | ICD-10-CM | POA: Insufficient documentation

## 2016-12-16 DIAGNOSIS — D375 Neoplasm of uncertain behavior of rectum: Secondary | ICD-10-CM | POA: Diagnosis present

## 2016-12-16 DIAGNOSIS — I110 Hypertensive heart disease with heart failure: Secondary | ICD-10-CM | POA: Diagnosis not present

## 2016-12-16 DIAGNOSIS — Z85038 Personal history of other malignant neoplasm of large intestine: Secondary | ICD-10-CM | POA: Insufficient documentation

## 2016-12-16 DIAGNOSIS — Z87891 Personal history of nicotine dependence: Secondary | ICD-10-CM | POA: Diagnosis not present

## 2016-12-16 DIAGNOSIS — I509 Heart failure, unspecified: Secondary | ICD-10-CM | POA: Insufficient documentation

## 2016-12-16 DIAGNOSIS — I252 Old myocardial infarction: Secondary | ICD-10-CM | POA: Insufficient documentation

## 2016-12-16 DIAGNOSIS — Z79899 Other long term (current) drug therapy: Secondary | ICD-10-CM | POA: Insufficient documentation

## 2016-12-16 DIAGNOSIS — E119 Type 2 diabetes mellitus without complications: Secondary | ICD-10-CM | POA: Insufficient documentation

## 2016-12-16 DIAGNOSIS — I350 Nonrheumatic aortic (valve) stenosis: Secondary | ICD-10-CM | POA: Insufficient documentation

## 2016-12-16 DIAGNOSIS — C20 Malignant neoplasm of rectum: Secondary | ICD-10-CM | POA: Diagnosis not present

## 2016-12-16 HISTORY — DX: Other specified diseases of anus and rectum: K62.89

## 2016-12-16 HISTORY — DX: Personal history of other diseases of the digestive system: Z87.19

## 2016-12-16 HISTORY — DX: Other forms of angina pectoris: I20.8

## 2016-12-16 HISTORY — DX: Presence of spectacles and contact lenses: Z97.3

## 2016-12-16 HISTORY — DX: Chronic kidney disease, stage 3 unspecified: N18.30

## 2016-12-16 HISTORY — DX: Personal history of other malignant neoplasm of large intestine: Z85.038

## 2016-12-16 HISTORY — DX: Unspecified right bundle-branch block: I45.10

## 2016-12-16 HISTORY — DX: Old myocardial infarction: I25.2

## 2016-12-16 HISTORY — DX: Presence of coronary angioplasty implant and graft: Z95.5

## 2016-12-16 HISTORY — DX: Constipation, unspecified: K59.00

## 2016-12-16 HISTORY — DX: Nonrheumatic aortic (valve) stenosis: I35.0

## 2016-12-16 HISTORY — DX: Chronic kidney disease, stage 3 (moderate): N18.3

## 2016-12-16 HISTORY — DX: Other forms of angina pectoris: I20.89

## 2016-12-16 LAB — POCT I-STAT 4, (NA,K, GLUC, HGB,HCT)
Glucose, Bld: 155 mg/dL — ABNORMAL HIGH (ref 65–99)
HEMATOCRIT: 34 % — AB (ref 39.0–52.0)
HEMOGLOBIN: 11.6 g/dL — AB (ref 13.0–17.0)
Potassium: 4.4 mmol/L (ref 3.5–5.1)
SODIUM: 137 mmol/L (ref 135–145)

## 2016-12-16 LAB — GLUCOSE, CAPILLARY: GLUCOSE-CAPILLARY: 149 mg/dL — AB (ref 65–99)

## 2016-12-16 SURGERY — EXAM UNDER ANESTHESIA
Anesthesia: General

## 2016-12-16 MED ORDER — SODIUM CHLORIDE 0.9% FLUSH
3.0000 mL | INTRAVENOUS | Status: DC | PRN
  Filled 2016-12-16: qty 3

## 2016-12-16 MED ORDER — FENTANYL CITRATE (PF) 100 MCG/2ML IJ SOLN
INTRAMUSCULAR | Status: DC | PRN
  Administered 2016-12-16: 25 ug via INTRAVENOUS

## 2016-12-16 MED ORDER — MEPERIDINE HCL 25 MG/ML IJ SOLN
6.2500 mg | INTRAMUSCULAR | Status: DC | PRN
  Filled 2016-12-16: qty 1

## 2016-12-16 MED ORDER — BUPIVACAINE-EPINEPHRINE 0.5% -1:200000 IJ SOLN
INTRAMUSCULAR | Status: DC | PRN
  Administered 2016-12-16: 30 mL

## 2016-12-16 MED ORDER — ACETAMINOPHEN 325 MG PO TABS
650.0000 mg | ORAL_TABLET | ORAL | Status: DC | PRN
  Filled 2016-12-16: qty 2

## 2016-12-16 MED ORDER — LIDOCAINE 5 % EX OINT
TOPICAL_OINTMENT | CUTANEOUS | Status: DC | PRN
  Administered 2016-12-16: 1

## 2016-12-16 MED ORDER — SODIUM CHLORIDE 0.9% FLUSH
3.0000 mL | Freq: Two times a day (BID) | INTRAVENOUS | Status: DC
  Filled 2016-12-16: qty 3

## 2016-12-16 MED ORDER — LIDOCAINE 2% (20 MG/ML) 5 ML SYRINGE
INTRAMUSCULAR | Status: AC
  Filled 2016-12-16: qty 5

## 2016-12-16 MED ORDER — PROPOFOL 10 MG/ML IV BOLUS
INTRAVENOUS | Status: DC | PRN
  Administered 2016-12-16: 110 mg via INTRAVENOUS

## 2016-12-16 MED ORDER — SODIUM CHLORIDE 0.9 % IV SOLN
250.0000 mL | INTRAVENOUS | Status: DC | PRN
  Filled 2016-12-16: qty 250

## 2016-12-16 MED ORDER — LIDOCAINE 2% (20 MG/ML) 5 ML SYRINGE
INTRAMUSCULAR | Status: DC | PRN
  Administered 2016-12-16: 100 mg via INTRAVENOUS

## 2016-12-16 MED ORDER — METOCLOPRAMIDE HCL 5 MG/ML IJ SOLN
10.0000 mg | Freq: Once | INTRAMUSCULAR | Status: DC | PRN
  Filled 2016-12-16: qty 2

## 2016-12-16 MED ORDER — OXYCODONE HCL 5 MG PO TABS: 5.0000 mg | ORAL_TABLET | Freq: Four times a day (QID) | ORAL | 0 refills | Status: DC | PRN

## 2016-12-16 MED ORDER — EPHEDRINE 5 MG/ML INJ
INTRAVENOUS | Status: AC
Start: 2016-12-16 — End: ?
  Filled 2016-12-16: qty 10

## 2016-12-16 MED ORDER — EPHEDRINE 5 MG/ML INJ
INTRAVENOUS | Status: AC
  Filled 2016-12-16: qty 10

## 2016-12-16 MED ORDER — OXYCODONE HCL 5 MG PO TABS
5.0000 mg | ORAL_TABLET | ORAL | Status: DC | PRN
  Filled 2016-12-16: qty 2

## 2016-12-16 MED ORDER — BUPIVACAINE LIPOSOME 1.3 % IJ SUSP
INTRAMUSCULAR | Status: DC | PRN
  Administered 2016-12-16: 20 mL

## 2016-12-16 MED ORDER — FENTANYL CITRATE (PF) 100 MCG/2ML IJ SOLN
25.0000 ug | INTRAMUSCULAR | Status: DC | PRN
  Filled 2016-12-16: qty 1

## 2016-12-16 MED ORDER — FENTANYL CITRATE (PF) 100 MCG/2ML IJ SOLN
INTRAMUSCULAR | Status: AC
  Filled 2016-12-16: qty 2

## 2016-12-16 MED ORDER — PROPOFOL 10 MG/ML IV BOLUS
INTRAVENOUS | Status: AC
  Filled 2016-12-16: qty 20

## 2016-12-16 MED ORDER — ONDANSETRON HCL 4 MG/2ML IJ SOLN
INTRAMUSCULAR | Status: DC | PRN
  Administered 2016-12-16: 4 mg via INTRAVENOUS

## 2016-12-16 MED ORDER — EPHEDRINE SULFATE 50 MG/ML IJ SOLN
INTRAMUSCULAR | Status: DC | PRN
  Administered 2016-12-16: 10 mg via INTRAVENOUS

## 2016-12-16 MED ORDER — SODIUM CHLORIDE 0.9 % IV SOLN
INTRAVENOUS | Status: DC
  Administered 2016-12-16: 13:00:00 via INTRAVENOUS
  Filled 2016-12-16: qty 1000

## 2016-12-16 MED ORDER — ONDANSETRON HCL 4 MG/2ML IJ SOLN
INTRAMUSCULAR | Status: AC
  Filled 2016-12-16: qty 2

## 2016-12-16 MED ORDER — LACTATED RINGERS IV SOLN
INTRAVENOUS | Status: DC
  Filled 2016-12-16: qty 1000

## 2016-12-16 MED ORDER — ACETAMINOPHEN 650 MG RE SUPP
650.0000 mg | RECTAL | Status: DC | PRN
  Filled 2016-12-16: qty 1

## 2016-12-16 SURGICAL SUPPLY — 50 items
APL SKNCLS STERI-STRIP NONHPOA (GAUZE/BANDAGES/DRESSINGS)
BENZOIN TINCTURE PRP APPL 2/3 (GAUZE/BANDAGES/DRESSINGS) ×1 IMPLANT
BLADE HEX COATED 2.75 (ELECTRODE) ×3 IMPLANT
BLADE SURG 10 STRL SS (BLADE) IMPLANT
BLADE SURG 15 STRL LF DISP TIS (BLADE) IMPLANT
BLADE SURG 15 STRL SS (BLADE)
BRIEF STRETCH FOR OB PAD LRG (UNDERPADS AND DIAPERS) ×3 IMPLANT
CANISTER SUCT 3000ML PPV (MISCELLANEOUS) ×3 IMPLANT
COVER BACK TABLE 60X90IN (DRAPES) ×3 IMPLANT
COVER MAYO STAND STRL (DRAPES) ×3 IMPLANT
DRAPE LAPAROTOMY 100X72 PEDS (DRAPES) ×3 IMPLANT
DRAPE SHEET LG 3/4 BI-LAMINATE (DRAPES) ×2 IMPLANT
DRAPE UTILITY XL STRL (DRAPES) ×3 IMPLANT
DRSG TELFA 3X8 NADH (GAUZE/BANDAGES/DRESSINGS) ×3 IMPLANT
ELECT BLADE 6.5 .24CM SHAFT (ELECTRODE) IMPLANT
ELECT REM PT RETURN 9FT ADLT (ELECTROSURGICAL) ×3
ELECTRODE REM PT RTRN 9FT ADLT (ELECTROSURGICAL) ×1 IMPLANT
GAUZE SPONGE 4X4 12PLY STRL LF (GAUZE/BANDAGES/DRESSINGS) ×2 IMPLANT
GLOVE BIO SURGEON STRL SZ 6.5 (GLOVE) ×2 IMPLANT
GLOVE BIO SURGEONS STRL SZ 6.5 (GLOVE) ×1
GLOVE INDICATOR 7.0 STRL GRN (GLOVE) ×3 IMPLANT
GOWN SPEC L3 XXLG W/TWL (GOWN DISPOSABLE) ×3 IMPLANT
IV CATH 14GX2 1/4 (CATHETERS) IMPLANT
IV CATH 18G SAFETY (IV SOLUTION) IMPLANT
KIT RM TURNOVER CYSTO AR (KITS) ×3 IMPLANT
LEGGING LITHOTOMY PAIR STRL (DRAPES) ×2 IMPLANT
NDL BIOPSY 14X6 SOFT TISS (NEEDLE) IMPLANT
NEEDLE BIOPSY 14X6 SOFT TISS (NEEDLE) ×3 IMPLANT
NEEDLE HYPO 22GX1.5 SAFETY (NEEDLE) ×3 IMPLANT
NS IRRIG 500ML POUR BTL (IV SOLUTION) ×3 IMPLANT
PACK BASIN DAY SURGERY FS (CUSTOM PROCEDURE TRAY) ×3 IMPLANT
PAD ABD 8X10 STRL (GAUZE/BANDAGES/DRESSINGS) ×3 IMPLANT
PAD ARMBOARD 7.5X6 YLW CONV (MISCELLANEOUS) ×3 IMPLANT
PAD DRESSING TELFA 3X8 NADH (GAUZE/BANDAGES/DRESSINGS) IMPLANT
PENCIL BUTTON HOLSTER BLD 10FT (ELECTRODE) ×3 IMPLANT
SPONGE HEMORRHOID 8X3CM (HEMOSTASIS) IMPLANT
SPONGE LAP 18X18 X RAY DECT (DISPOSABLE) ×2 IMPLANT
SPONGE SURGIFOAM ABS GEL 100 (HEMOSTASIS) IMPLANT
SPONGE SURGIFOAM ABS GEL 12-7 (HEMOSTASIS) IMPLANT
SUT CHROMIC 2 0 SH (SUTURE) ×2 IMPLANT
SUT CHROMIC 3 0 SH 27 (SUTURE) ×2 IMPLANT
SUT VIC AB 2-0 SH 27 (SUTURE)
SUT VIC AB 2-0 SH 27XBRD (SUTURE) IMPLANT
SYR CONTROL 10ML LL (SYRINGE) ×3 IMPLANT
SYRINGE 10CC LL (SYRINGE) IMPLANT
TOWEL OR 17X24 6PK STRL BLUE (TOWEL DISPOSABLE) ×6 IMPLANT
TRAY DSU PREP LF (CUSTOM PROCEDURE TRAY) ×3 IMPLANT
TUBE CONNECTING 12'X1/4 (SUCTIONS) ×1
TUBE CONNECTING 12X1/4 (SUCTIONS) ×2 IMPLANT
YANKAUER SUCT BULB TIP NO VENT (SUCTIONS) ×3 IMPLANT

## 2016-12-16 NOTE — Discharge Instructions (Addendum)
ANORECTAL SURGERY: POST OP INSTRUCTIONS °1. Take your usually prescribed home medications unless otherwise directed. °2. DIET: During the first few hours after surgery sip on some liquids until you are able to urinate.  It is normal to not urinate for several hours after this surgery.  If you feel uncomfortable, please contact the office for instructions.  After you are able to urinate,you may eat, if you feel like it.  Follow a light bland diet the first 24 hours after arrival home, such as soup, liquids, crackers, etc.  Be sure to include lots of fluids daily (6-8 glasses).  Avoid fast food or heavy meals, as your are more likely to get nauseated.  Eat a low fat diet the next few days after surgery.  Limit caffeine intake to 1-2 servings a day. °3. PAIN CONTROL: °a. Pain is best controlled by a usual combination of several different methods TOGETHER: °i. Muscle relaxation: Soak in a warm bath (or Sitz bath) three times a day and after bowel movements.  Continue to do this until all pain is resolved. °ii. Over the counter pain medication °iii. Prescription pain medication °b. Most patients will experience some swelling and discomfort in the anus/rectal area and incisions.  Heat such as warm towels, sitz baths, warm baths, etc to help relax tight/sore spots and speed recovery.  Some people prefer to use ice, especially in the first couple days after surgery, as it may decrease the pain and swelling, or alternate between ice & heat.  Experiment to what works for you.  Swelling and bruising can take several weeks to resolve.  Pain can take even longer to completely resolve. °c. It is helpful to take an over-the-counter pain medication regularly for the first few weeks.  Choose one of the following that works best for you: °i. Naproxen (Aleve, etc)  Two 220mg tabs twice a day °ii. Ibuprofen (Advil, etc) Three 200mg tabs four times a day (every meal & bedtime) °d. A  prescription for pain medication (such as percocet,  oxycodone, hydrocodone, etc) should be given to you upon discharge.  Take your pain medication as prescribed.  °i. If you are having problems/concerns with the prescription medicine (does not control pain, nausea, vomiting, rash, itching, etc), please call us [phone] to see if we need to switch you to a different pain medicine that will work better for you and/or control your side effect better. °ii. If you need a refill on your pain medication, please contact your pharmacy.  They will contact our office to request authorization. Prescriptions will not be filled after 5 pm or on week-ends. °4. KEEP YOUR BOWELS REGULAR and AVOID CONSTIPATION °a. The goal is one to two soft bowel movements a day.  You should at least have a bowel movement every other day. °b. Avoid getting constipated.  Between the surgery and the pain medications, it is common to experience some constipation. This can be very painful after rectal surgery.  Increasing fluid intake and taking a fiber supplement (such as Metamucil, Citrucel, FiberCon, etc) 1-2 times a day regularly will usually help prevent this problem from occurring.  A stool softener like colace is also recommended.  This can be purchased over the counter at your pharmacy.  You can take it up to 3 times a day.  If you do not have a bowel movement after 24 hrs since your surgery, take one does of milk of magnesia.  If you still haven't had a bowel movement 8-12 hours after   that dose, take another dose.  If you don't have a bowel movement 48 hrs after surgery, purchase a Fleets enema from the drug store and administer gently per package instructions.  If you still are having trouble with your bowel movements after that, please call the office for further instructions. °c. If you develop diarrhea or have many loose bowel movements, simplify your diet to bland foods & liquids for a few days.  Stop any stool softeners and decrease your fiber supplement.  Switching to mild  anti-diarrheal medications (Kayopectate, Pepto Bismol) can help.  If this worsens or does not improve, please call us. ° °5. Wound Care °a. Remove your bandages before your first bowel movement or 8 hours after surgery.     °b. Remove any wound packing material at this tim,e as well.  You do not need to repack the wound unless instructed otherwise.  Wear an absorbent pad or soft cotton gauze in your underwear to catch any drainage and help keep the area clean. You should change this every 2-3 hours while awake. °c. Keep the area clean and dry.  Bathe / shower every day, especially after bowel movements.  Keep the area clean by showering / bathing over the incision / wound.   It is okay to soak an open wound to help wash it.  Wet wipes or showers / gentle washing after bowel movements is often less traumatic than regular toilet paper. °d. You may have some styrofoam-like soft packing in the rectum which will come out with the first bowel movement.  °e. You will often notice bleeding with bowel movements.  This should slow down by the end of the first week of surgery °f. Expect some drainage.  This should slow down, too, by the end of the first week of surgery.  Wear an absorbent pad or soft cotton gauze in your underwear until the drainage stops. °g. Do Not sit on a rubber or pillow ring.  This can make you symptoms worse.  You may sit on a soft pillow if needed.  °6. ACTIVITIES as tolerated:   °a. You may resume regular (light) daily activities beginning the next day--such as daily self-care, walking, climbing stairs--gradually increasing activities as tolerated.  If you can walk 30 minutes without difficulty, it is safe to try more intense activity such as jogging, treadmill, bicycling, low-impact aerobics, swimming, etc. °b. Save the most intensive and strenuous activity for last such as sit-ups, heavy lifting, contact sports, etc  Refrain from any heavy lifting or straining until you are off narcotics for pain  control.   °c. You may drive when you are no longer taking prescription pain medication, you can comfortably sit for long periods of time, and you can safely maneuver your car and apply brakes. °d. You may have sexual intercourse when it is comfortable.  °7. FOLLOW UP in our office °a. Please call [facility] at [phone] to set up an appointment to see your surgeon in the office for a follow-up appointment approximately 3-4 weeks after your surgery. °b. Make sure that you call for this appointment the day you arrive home to insure a convenient appointment time. °10. IF YOU HAVE DISABILITY OR FAMILY LEAVE FORMS, BRING THEM TO THE OFFICE FOR PROCESSING.  DO NOT GIVE THEM TO YOUR DOCTOR. ° ° ° ° °WHEN TO CALL US [phone]: °1. Poor pain control °2. Reactions / problems with new medications (rash/itching, nausea, etc)  °3. Fever over 101.5 F (38.5 C) °4.   Inability to urinate °5. Nausea and/or vomiting °6. Worsening swelling or bruising °7. Continued bleeding from incision. °8. Increased pain, redness, or drainage from the incision ° °The clinic staff is available to answer your questions during regular business hours (8:30am-5pm).  Please don’t hesitate to call and ask to speak to one of our nurses for clinical concerns.   A surgeon from [facility] is always on call at the hospitals °  °If you have a medical emergency, go to the nearest emergency room or call 911. °  ° °[facility] °[address], [address], [place], [place]  [address] ? °MAIN: [phone] ? TOLL FREE: [phone] ? °FAX [phone] °[email or web address] ° ° ° ° °Post Anesthesia Home Care Instructions ° °Activity: °Get plenty of rest for the remainder of the day. A responsible individual must stay with you for 24 hours following the procedure.  °For the next 24 hours, DO NOT: °-Drive a car °-Operate machinery °-Drink alcoholic beverages °-Take any medication unless instructed by your  physician °-Make any legal decisions or sign important papers. ° °Meals: °Start with liquid foods such as gelatin or soup. Progress to regular foods as tolerated. Avoid greasy, spicy, heavy foods. If nausea and/or vomiting occur, drink only clear liquids until the nausea and/or vomiting subsides. Call your physician if vomiting continues. ° °Special Instructions/Symptoms: °Your throat may feel dry or sore from the anesthesia or the breathing tube placed in your throat during surgery. If this causes discomfort, gargle with warm salt water. The discomfort should disappear within 24 hours. ° °If you had a scopolamine patch placed behind your ear for the management of post- operative nausea and/or vomiting: ° °1. The medication in the patch is effective for 72 hours, after which it should be removed.  Wrap patch in a tissue and discard in the trash. Wash hands thoroughly with soap and water. °2. You may remove the patch earlier than 72 hours if you experience unpleasant side effects which may include dry mouth, dizziness or visual disturbances. °3. Avoid touching the patch. Wash your hands with soap and water after contact with the patch. °   ° ° °Information for Discharge Teaching: °EXPAREL (bupivacaine liposome injectable suspension)  ° °Your surgeon gave you EXPAREL(bupivacaine) in your surgical incision to help control your pain after surgery.  °· EXPAREL is a local anesthetic that provides pain relief by numbing the tissue around the surgical site. °· EXPAREL is designed to release pain medication over time and can control pain for up to 72 hours. °· Depending on how you respond to EXPAREL, you may require less pain medication during your recovery. ° °Possible side effects: °· Temporary loss of sensation or ability to move in the area where bupivacaine was injected. °· Nausea, vomiting, constipation °· Rarely, numbness and tingling in your mouth or lips, lightheadedness, or anxiety may occur. °· Call your doctor  right away if you think you may be experiencing any of these sensations, or if you have other questions regarding possible side effects. ° °Follow all other discharge instructions given to you by your surgeon or nurse. Eat a healthy diet and drink plenty of water or other fluids. ° °If you return to the hospital for any reason within 96 hours following the administration of EXPAREL, please inform your health care providers. ° °

## 2016-12-16 NOTE — Anesthesia Procedure Notes (Signed)
Procedure Name: LMA Insertion Date/Time: 12/16/2016 1:25 PM Performed by: Denna Haggard D Pre-anesthesia Checklist: Patient identified, Emergency Drugs available, Suction available and Patient being monitored Patient Re-evaluated:Patient Re-evaluated prior to induction Oxygen Delivery Method: Circle system utilized Preoxygenation: Pre-oxygenation with 100% oxygen Induction Type: IV induction Ventilation: Mask ventilation without difficulty LMA: LMA inserted LMA Size: 4.0 Number of attempts: 1 Airway Equipment and Method: Bite block Placement Confirmation: positive ETCO2 Tube secured with: Tape Dental Injury: Teeth and Oropharynx as per pre-operative assessment

## 2016-12-16 NOTE — Op Note (Addendum)
12/16/2016  2:00 PM  PATIENT:  [name]  83 y.o. male  Patient Care Team: Lavone Orn, MD as PCP - General (Internal Medicine)  PRE-OPERATIVE DIAGNOSIS:  rectal mass  POST-OPERATIVE DIAGNOSIS:  rectal mass  PROCEDURE:   ANAL EXAM UNDER ANESTHESIA, BIOPSY , rectal ultrasound  SURGEON:  Surgeon(s): Leighton Ruff, MD  ANESTHESIA:   local and MAC  EBL: min  PATIENT DISPOSITION:  PACU - hemodynamically stable.   INDICATION: anal mass with pain  OR FINDINGS: Anal canal mass, uT3N0  DESCRIPTION:   The patient was identified in the holding area and taken to the procedure room where they were laid in lithotomy position on the exam room table.  The patient was anesthetized per the anesthesia team. The patient was then prepped and draped in the usual sterile fashion. A timeout was performed indicating the correct patient and procedure. A rectal block was performed using Marcaine mixed with epinephrine and lysosomal bupivacaine. I began by performing a digital rectal exam. There was a mass located in the left posterior lateral anal canal clearly involving the sphincter complex. This appeared to originate around the dentate line. The mass was approximately 2 x 2 centimeters. A Tru-Cut needle was used to take core biopsies of the mass to sent to pathology.   The US probe was inserted without difficulty.  The levators were identified and the probe was withdrawn slowly. I identified the prostate and seminal vesicles anteriorly. The internal sphincter was identified and followed distally. There was a mass noted within the anal canal that traversed the internal and external sphincter complex.  Once this was complete a dressing was applied. The patient was awakened from anesthesia and sent to the postanesthesia care unit in stable condition. All counts were correct per operating room staff.           I have reviewed the [place] and see no other narcotic prescriptions listed  for this patient

## 2016-12-16 NOTE — Transfer of Care (Signed)
Immediate Anesthesia Transfer of Care Note  Patient: [name]  Procedure(s) Performed: Procedure(s) (LRB): ANAL EXAM UNDER ANESTHESIA TO BIOPSY , rectal ultrasound (N/A)  Patient Location: PACU  Anesthesia Type: MAC  Level of Consciousness: awake, alert , oriented and patient cooperative  Airway & Oxygen Therapy: Patient Spontanous Breathing and Patient connected to face mask oxygen  Post-op Assessment: Report given to PACU RN and Post -op Vital signs reviewed and stable  Post vital signs: Reviewed and stable  Complications: No apparent anesthesia complications  Last Vitals:  Vitals:   12/16/16 1119 12/16/16 1413  BP: 124/69 (!) 147/60  Pulse: (!) 34 64  Resp: 16 12  Temp: (!) 36.3 C 36.4 C  SpO2: 96% 100%    Last Pain:  Vitals:   12/16/16 1204  TempSrc:   PainSc: 3       Patients Stated Pain Goal: 5 (12/16/16 1204)

## 2016-12-16 NOTE — Anesthesia Postprocedure Evaluation (Signed)
Anesthesia Post Note  Patient: [name]  Procedure(s) Performed: Procedure(s) (LRB): ANAL EXAM UNDER ANESTHESIA WITH BIOPSY , RECTAL ULTRASOUND (N/A)     Patient location during evaluation: PACU Anesthesia Type: General Level of consciousness: awake and alert Pain management: pain level controlled Vital Signs Assessment: post-procedure vital signs reviewed and stable Respiratory status: spontaneous breathing, nonlabored ventilation, respiratory function stable and patient connected to nasal cannula oxygen Cardiovascular status: stable and blood pressure returned to baseline Anesthetic complications: no    Last Vitals:  Vitals:   12/16/16 1445 12/16/16 1500  BP: (!) 159/78 (!) 143/72  Pulse: (!) 32 98  Resp: 15 17  Temp:    SpO2: 92% 91%    Last Pain:  Vitals:   12/16/16 1204  TempSrc:   PainSc: 3                  Maela Takeda,JAMES TERRILL

## 2016-12-16 NOTE — Interval H&P Note (Signed)
History and Physical Interval Note:  12/16/2016 12:14 PM  [name]  has presented today for surgery, with the diagnosis of rectal mass  The various methods of treatment have been discussed with the patient and family. After consideration of risks, benefits and other options for treatment, the patient has consented to  Procedure(s): ANAL EXAM UNDER ANESTHESIA TO BIOPSY (N/A) as a surgical intervention .  The patient's history has been reviewed, patient examined, no change in status, stable for surgery.  I have reviewed the patient's chart and labs.  Questions were answered to the patient's satisfaction.     Rosario Adie, MD  [facility]

## 2016-12-16 NOTE — Anesthesia Preprocedure Evaluation (Addendum)
Anesthesia Evaluation  Patient identified by MRN, date of birth, ID band Patient awake    Reviewed: Allergy & Precautions, NPO status , Patient's Chart, lab work & pertinent test results  Airway Mallampati: II  TM Distance: >3 FB Neck ROM: Full    Dental no notable dental hx.    Pulmonary former smoker,    Pulmonary exam normal breath sounds clear to auscultation       Cardiovascular hypertension, Pt. on medications + CAD and + Cardiac Stents  Normal cardiovascular exam+ dysrhythmias Atrial Fibrillation  Rhythm:Regular Rate:Normal     Neuro/Psych negative neurological ROS  negative psych ROS   GI/Hepatic negative GI ROS, Neg liver ROS,   Endo/Other  diabetes  Renal/GU negative Renal ROS  negative genitourinary   Musculoskeletal negative musculoskeletal ROS (+)   Abdominal   Peds negative pediatric ROS (+)  Hematology negative hematology ROS (+)   Anesthesia Other Findings   Reproductive/Obstetrics negative OB ROS                           Anesthesia Physical Anesthesia Plan  ASA: III  Anesthesia Plan: General   Post-op Pain Management:    Induction: Intravenous  PONV Risk Score and Plan: 2 and Ondansetron and Treatment may vary due to age or medical condition  Airway Management Planned: LMA  Additional Equipment:   Intra-op Plan:   Post-operative Plan:   Informed Consent: I have reviewed the patients History and Physical, chart, labs and discussed the procedure including the risks, benefits and alternatives for the proposed anesthesia with the patient or authorized representative who has indicated his/her understanding and acceptance.   Dental advisory given  Plan Discussed with: CRNA  Anesthesia Plan Comments:         Anesthesia Quick Evaluation

## 2016-12-16 NOTE — H&P (View-Only) (Signed)
History of Present Illness [name] Ruff MD; 1/91/4782 2:45 PM) The patient is a 83 year old male who presents with a colorectal polyp. 83 year old male who underwent flexible sigmoidoscopy in May for rectal bleeding. He was noted to have a mass in the distal rectum which was biopsied. Pathology shows tubulovillous adenoma. There was concern that some of the mass was more firm and fixed underlying tissues. He continues to have rectal bleeding on a daily basis. He also complains of worsening constipation. He has tried MiraLAX Empirin juice with no lasting success. He states he always feels some urgency but is unable to have bowel movements.   Past Surgical History Malachy Moan, [place]; 12/06/2016 2:24 PM) Colon Removal - Partial  Allergies Malachy Moan, [place]; 12/06/2016 2:25 PM) No Known Allergies 12/06/2016  Medication History Malachy Moan, [place]; 12/06/2016 2:26 PM) AmLODIPine Besylate (2.5MG  Tablet, Oral) Active. Atorvastatin Calcium (40MG  Tablet, Oral) Active. Carvedilol (6.25MG  Tablet, Oral) Active. Donepezil HCl (10MG  Tablet, Oral) Active. Glimepiride (1MG  Tablet, Oral) Active. Isosorbide Mononitrate ER (60MG  Tablet ER 24HR, Oral) Active. Lisinopril (5MG  Tablet, Oral) Active. Nitroglycerin (0.4MG  Tab Sublingual, Sublingual) Active. Pantoprazole Sodium (40MG  Tablet DR, Oral) Active. Ranexa (1000MG  Tablet ER 12HR, Oral) Active. Warfarin Sodium (4MG  Tablet, Oral) Active. Medications Reconciled  Social History Malachy Moan, [place]; 12/06/2016 2:24 PM) Alcohol use Occasional alcohol use. Caffeine use Coffee, Tea. No drug use Tobacco use Former smoker.  Family History Malachy Moan, [place]; 12/06/2016 2:24 PM) Heart Disease Brother.  Other Problems Malachy Moan, [place]; 12/06/2016 2:24 PM) Chest pain Colon Cancer Congestive Heart Failure Heart murmur High blood pressure Inguinal Hernia Kidney Stone Myocardial  infarction     Review of Systems Malachy Moan RMA; 12/06/2016 2:25 PM) General Present- Fatigue and Weight Loss. Not Present- Appetite Loss, Chills, Fever, Night Sweats and Weight Gain. Skin Not Present- Change in Wart/Mole, Dryness, Hives, Jaundice, New Lesions, Non-Healing Wounds, Rash and Ulcer. HEENT Present- Seasonal Allergies. Not Present- Earache, Hearing Loss, Hoarseness, Nose Bleed, Oral Ulcers, Ringing in the Ears, Sinus Pain, Sore Throat, Visual Disturbances, Wears glasses/contact lenses and Yellow Eyes. Respiratory Not Present- Bloody sputum, Chronic Cough, Difficulty Breathing, Snoring and Wheezing. Breast Not Present- Breast Mass, Breast Pain, Nipple Discharge and Skin Changes. Cardiovascular Present- Chest Pain. Not Present- Difficulty Breathing Lying Down, Leg Cramps, Palpitations, Rapid Heart Rate, Shortness of Breath and Swelling of Extremities. Gastrointestinal Present- Bloody Stool, Change in Bowel Habits and Rectal Pain. Not Present- Abdominal Pain, Bloating, Chronic diarrhea, Constipation, Difficulty Swallowing, Excessive gas, Gets full quickly at meals, Hemorrhoids, Indigestion, Nausea and Vomiting. Male Genitourinary Not Present- Blood in Urine, Change in Urinary Stream, Frequency, Impotence, Nocturia, Painful Urination, Urgency and Urine Leakage. Musculoskeletal Present- Back Pain. Not Present- Joint Pain, Joint Stiffness, Muscle Pain, Muscle Weakness and Swelling of Extremities. Psychiatric Not Present- Anxiety, Bipolar, Change in Sleep Pattern, Depression, Fearful and Frequent crying.  Vitals Malachy Moan RMA; 12/06/2016 2:26 PM) 12/06/2016 2:26 PM Weight: 144.6 lb Height: 69in Body Surface Area: 1.8 m Body Mass Index: 21.35 kg/m  Temp.: 97.19F  Pulse: 51 (Regular)  BP: 112/60 (Sitting, Left Arm, Standard)      Physical Exam [name] Ruff MD; 9/56/2130 2:55 PM)  General Mental Status-Alert. General Appearance-Not in acute  distress. Build & Nutrition-Well nourished. Posture-Normal posture. Gait-Normal.  Head and Neck Head-normocephalic, atraumatic with no lesions or palpable masses. Trachea-midline.  Chest and Lung Exam Chest and lung exam reveals -on auscultation, normal breath sounds, no adventitious sounds and normal vocal resonance.  Cardiovascular Cardiovascular examination reveals -normal heart sounds,  regular rate and rhythm with no murmurs and no digital clubbing, cyanosis, edema, increased warmth or tenderness.  Abdomen Inspection Inspection of the abdomen reveals - No Hernias. Palpation/Percussion Palpation and Percussion of the abdomen reveal - Soft, Non Tender, No Rigidity (guarding), No hepatosplenomegaly and No Palpable abdominal masses.  Rectal Note: Partially circumferential mass affixed to sphincter complex, tender to palpation, friable  Neurologic Neurologic evaluation reveals -alert and oriented x 3 with no impairment of recent or remote memory, normal attention span and ability to concentrate, normal sensation and normal coordination.  Musculoskeletal Normal Exam - Bilateral-Upper Extremity Strength Normal and Lower Extremity Strength Normal.    Assessment & Plan [name] Ruff MD; 4/46/2863 2:56 PM)  RECTAL MASS (K62.9) Impression: Patient has a large distal rectal mass invading his anal sphincters almost circumferentially. He has coronary artery disease and aortic stenosis. We will discuss his risks with his cadiologist. I have recommended to begin with and examined under anesthesia and repeat biopsies to better stage his presumed rectal cancer. At that point we can discuss whether APR or diverting colostomy would be the best treatment.  He does have a history of metastatic colon cancer and it appears that this disease was resected.

## 2016-12-17 ENCOUNTER — Telehealth: Payer: Self-pay | Admitting: General Surgery

## 2016-12-17 NOTE — Telephone Encounter (Signed)
Notified pt and his sister of pathology results.  Will get CT scans set up as well as apt with radiation oncology and oncology.  Pt's sister's number is [phone]: Camie Patience.

## 2016-12-21 ENCOUNTER — Other Ambulatory Visit: Payer: Self-pay | Admitting: General Surgery

## 2016-12-21 ENCOUNTER — Encounter: Payer: Self-pay | Admitting: Radiation Oncology

## 2016-12-21 DIAGNOSIS — K6289 Other specified diseases of anus and rectum: Secondary | ICD-10-CM

## 2016-12-22 ENCOUNTER — Telehealth: Payer: Self-pay | Admitting: Hematology

## 2016-12-22 ENCOUNTER — Telehealth: Payer: Self-pay

## 2016-12-22 ENCOUNTER — Encounter: Payer: Self-pay | Admitting: Hematology

## 2016-12-22 NOTE — Telephone Encounter (Signed)
Error in charting.

## 2016-12-22 NOTE — Telephone Encounter (Signed)
Appt has been scheduled for the pt to see Dr. Burr Medico on 8/21 at 2pm. Pt is aware of appt. Also notified the pt's nephew, Antavius Sperbeck about the appt. He will notify the pt's sister. Letter mailed to the pt.

## 2016-12-23 ENCOUNTER — Ambulatory Visit
Admission: RE | Admit: 2016-12-23 | Discharge: 2016-12-23 | Disposition: A | Discharge status: Home / Self Care | Payer: Medicare Other | Source: Ambulatory Visit | Attending: General Surgery | Admitting: General Surgery

## 2016-12-23 ENCOUNTER — Telehealth: Payer: Self-pay

## 2016-12-23 DIAGNOSIS — K6289 Other specified diseases of anus and rectum: Secondary | ICD-10-CM

## 2016-12-23 NOTE — Telephone Encounter (Signed)
Attempted to call [name] sister to inform her of patient's upcoming appointments and to introduce my role as his GI Navigator but no one answered This phone number does not have an answering machine and I was not able to leave a call back message.

## 2016-12-24 NOTE — Progress Notes (Signed)
  Oncology Nurse Navigator Documentation    Referral date to RadOnc/MedOnc: 12/21/16 (12/24/16 1000) )Navigator Encounter Type: Introductory phone call (12/24/16 1000)   Abnormal Finding Date: 10/07/16 (12/24/16 1000) Confirmed Diagnosis Date: 12/16/16 (12/24/16 1000) Surgery Date: 12/16/16 (12/24/16 1000)       Multidisiplinary Clinic Date: 12/22/16 (12/24/16 1000) Multidisiplinary Clinic Type: GI (12/24/16 1000)     Treatment Phase: Pre-Tx/Tx Discussion (12/24/16 1000) Barriers/Navigation Needs: Coordination of Care (12/24/16 1000)   Interventions: Education;Coordination of Care (12/24/16 1000)  I called patient to introduce myself and my role as his GI Navigator. Patient verbalized when his upcoming appointments are . I will touch base with patient and sister on 12/28/16 during his initial appointment with Dr. Burr Medico.   Education Method: Verbal;Teach-back (12/24/16 1000)      Acuity: Level 2 (12/24/16 1000)   Acuity Level 2: Initial guidance, education and coordination as needed;Educational needs;Ongoing guidance and education throughout treatment as needed (12/24/16 1000)     Time Spent with Patient: 15 (12/24/16 1000)

## 2016-12-27 DIAGNOSIS — C20 Malignant neoplasm of rectum: Secondary | ICD-10-CM | POA: Insufficient documentation

## 2016-12-27 NOTE — Progress Notes (Signed)
Please see the Nurse Progress Note in the MD Initial Consult Encounter for this patient. 

## 2016-12-27 NOTE — Progress Notes (Signed)
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 Leighton Ruff, MD 09/08/74 bx rectal  Mass  10/06/16 Flexible sigmoidoscopy  Dr. Sallye Lat bx,   Past/Anticipated interventions by medical oncology, if any:Dr. Burr Medico 12/28/16 @ 2pm after seeing Dr. Lisbeth Renshaw  Weight changes, if any: weight loss 18 lb   Bowel/Bladder complaints, if any: constipation,has urgency unable to have bm, miralax ,empirin juice no lasting success,, not taking any longer had diarrhea today  Nausea / Vomiting, if any:  No Pain issues, if any: shoulder rectal pain 3/4 on 10 scale  Any blood per rectum:   yes  SAFETY ISSUES:yes, walks with a cane, weakness  Prior radiation? Yes,  Chest area  Before 1995, at [place]   Pacemaker/ICD? NO  Is the patient on methotrexate? 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, Depression, DM II, SBO 2005, cardiac catheterization 08/03/10 & 05/10/10, 08/15/13  Cystoscopy  With B/L retrograde pyelogram, Left Utero scopy and stent placement 05/18/2013 Former cigarette smoker 30 years 1ppd,quit 05/10/94, 2 shots liquor per week, no drug or smokeless tobacco use  Mother Cancer, Father Ulcers,Brother MI  Allergies:Metformin and related  BP (!) 104/46 Comment: lfa sitting  Pulse (!) 49   Temp 98 F (36.7 C) (Oral)   Resp 20   Ht 5\' 9"  (1.753 m)   Wt 141 lb 9.6 oz (64.2 kg)   SpO2 98%   BMI 20.91 kg/m   Wt Readings from Last 3 Encounters:  12/28/16 141 lb 9.6 oz (64.2 kg)   12/16/16 140 lb (63.5 kg)  11/25/16 141 lb 1.9 oz (64 kg)   Allergies:NKA

## 2016-12-28 ENCOUNTER — Other Ambulatory Visit (HOSPITAL_BASED_OUTPATIENT_CLINIC_OR_DEPARTMENT_OTHER): Payer: Medicare Other

## 2016-12-28 ENCOUNTER — Telehealth: Payer: Self-pay | Admitting: *Deleted

## 2016-12-28 ENCOUNTER — Ambulatory Visit
Admission: RE | Admit: 2016-12-28 | Discharge: 2016-12-28 | Disposition: A | Discharge status: Home / Self Care | Payer: Medicare Other | Source: Ambulatory Visit | Attending: Radiation Oncology | Admitting: Radiation Oncology

## 2016-12-28 ENCOUNTER — Ambulatory Visit (HOSPITAL_BASED_OUTPATIENT_CLINIC_OR_DEPARTMENT_OTHER): Payer: Medicare Other | Admitting: Hematology

## 2016-12-28 ENCOUNTER — Encounter: Payer: Self-pay | Admitting: Hematology

## 2016-12-28 ENCOUNTER — Encounter: Payer: Self-pay | Admitting: Radiation Oncology

## 2016-12-28 ENCOUNTER — Telehealth: Payer: Self-pay | Admitting: Hematology

## 2016-12-28 VITALS — BP 104/46 | HR 49 | Temp 98.0°F | Resp 20 | Ht 69.0 in | Wt 141.6 lb

## 2016-12-28 VITALS — BP 118/49 | HR 72 | Temp 98.0°F | Resp 18 | Ht 69.0 in | Wt 140.0 lb

## 2016-12-28 DIAGNOSIS — K5900 Constipation, unspecified: Secondary | ICD-10-CM

## 2016-12-28 DIAGNOSIS — Z803 Family history of malignant neoplasm of breast: Secondary | ICD-10-CM | POA: Diagnosis not present

## 2016-12-28 DIAGNOSIS — R197 Diarrhea, unspecified: Secondary | ICD-10-CM | POA: Diagnosis not present

## 2016-12-28 DIAGNOSIS — C210 Malignant neoplasm of anus, unspecified: Secondary | ICD-10-CM

## 2016-12-28 DIAGNOSIS — Z9841 Cataract extraction status, right eye: Secondary | ICD-10-CM | POA: Diagnosis not present

## 2016-12-28 DIAGNOSIS — E1122 Type 2 diabetes mellitus with diabetic chronic kidney disease: Secondary | ICD-10-CM | POA: Insufficient documentation

## 2016-12-28 DIAGNOSIS — I480 Paroxysmal atrial fibrillation: Secondary | ICD-10-CM | POA: Insufficient documentation

## 2016-12-28 DIAGNOSIS — I5022 Chronic systolic (congestive) heart failure: Secondary | ICD-10-CM | POA: Diagnosis not present

## 2016-12-28 DIAGNOSIS — N183 Chronic kidney disease, stage 3 (moderate): Secondary | ICD-10-CM | POA: Insufficient documentation

## 2016-12-28 DIAGNOSIS — Z85048 Personal history of other malignant neoplasm of rectum, rectosigmoid junction, and anus: Secondary | ICD-10-CM

## 2016-12-28 DIAGNOSIS — Z510 Encounter for antineoplastic radiation therapy: Secondary | ICD-10-CM | POA: Insufficient documentation

## 2016-12-28 DIAGNOSIS — Z87891 Personal history of nicotine dependence: Secondary | ICD-10-CM | POA: Diagnosis not present

## 2016-12-28 DIAGNOSIS — Z9842 Cataract extraction status, left eye: Secondary | ICD-10-CM | POA: Insufficient documentation

## 2016-12-28 DIAGNOSIS — C20 Malignant neoplasm of rectum: Secondary | ICD-10-CM | POA: Diagnosis not present

## 2016-12-28 DIAGNOSIS — I130 Hypertensive heart and chronic kidney disease with heart failure and stage 1 through stage 4 chronic kidney disease, or unspecified chronic kidney disease: Secondary | ICD-10-CM | POA: Diagnosis not present

## 2016-12-28 DIAGNOSIS — C189 Malignant neoplasm of colon, unspecified: Secondary | ICD-10-CM

## 2016-12-28 DIAGNOSIS — D500 Iron deficiency anemia secondary to blood loss (chronic): Secondary | ICD-10-CM | POA: Insufficient documentation

## 2016-12-28 DIAGNOSIS — I252 Old myocardial infarction: Secondary | ICD-10-CM | POA: Insufficient documentation

## 2016-12-28 DIAGNOSIS — C772 Secondary and unspecified malignant neoplasm of intra-abdominal lymph nodes: Principal | ICD-10-CM

## 2016-12-28 DIAGNOSIS — Z9221 Personal history of antineoplastic chemotherapy: Secondary | ICD-10-CM | POA: Diagnosis not present

## 2016-12-28 DIAGNOSIS — Z961 Presence of intraocular lens: Secondary | ICD-10-CM | POA: Insufficient documentation

## 2016-12-28 DIAGNOSIS — Z79899 Other long term (current) drug therapy: Secondary | ICD-10-CM | POA: Insufficient documentation

## 2016-12-28 DIAGNOSIS — E785 Hyperlipidemia, unspecified: Secondary | ICD-10-CM | POA: Insufficient documentation

## 2016-12-28 DIAGNOSIS — C211 Malignant neoplasm of anal canal: Secondary | ICD-10-CM | POA: Diagnosis not present

## 2016-12-28 DIAGNOSIS — Z9049 Acquired absence of other specified parts of digestive tract: Secondary | ICD-10-CM | POA: Insufficient documentation

## 2016-12-28 DIAGNOSIS — Z955 Presence of coronary angioplasty implant and graft: Secondary | ICD-10-CM | POA: Insufficient documentation

## 2016-12-28 DIAGNOSIS — Z7982 Long term (current) use of aspirin: Secondary | ICD-10-CM | POA: Insufficient documentation

## 2016-12-28 DIAGNOSIS — Z85038 Personal history of other malignant neoplasm of large intestine: Secondary | ICD-10-CM | POA: Insufficient documentation

## 2016-12-28 DIAGNOSIS — K769 Liver disease, unspecified: Secondary | ICD-10-CM | POA: Diagnosis not present

## 2016-12-28 DIAGNOSIS — I350 Nonrheumatic aortic (valve) stenosis: Secondary | ICD-10-CM | POA: Diagnosis not present

## 2016-12-28 DIAGNOSIS — F329 Major depressive disorder, single episode, unspecified: Secondary | ICD-10-CM | POA: Insufficient documentation

## 2016-12-28 DIAGNOSIS — Z8249 Family history of ischemic heart disease and other diseases of the circulatory system: Secondary | ICD-10-CM | POA: Insufficient documentation

## 2016-12-28 DIAGNOSIS — Z923 Personal history of irradiation: Secondary | ICD-10-CM | POA: Diagnosis not present

## 2016-12-28 DIAGNOSIS — Z888 Allergy status to other drugs, medicaments and biological substances status: Secondary | ICD-10-CM | POA: Insufficient documentation

## 2016-12-28 DIAGNOSIS — Z7984 Long term (current) use of oral hypoglycemic drugs: Secondary | ICD-10-CM | POA: Insufficient documentation

## 2016-12-28 DIAGNOSIS — I2510 Atherosclerotic heart disease of native coronary artery without angina pectoris: Secondary | ICD-10-CM | POA: Insufficient documentation

## 2016-12-28 DIAGNOSIS — Z87442 Personal history of urinary calculi: Secondary | ICD-10-CM | POA: Insufficient documentation

## 2016-12-28 LAB — CBC WITH DIFFERENTIAL/PLATELET
BASO%: 0.1 % (ref 0.0–2.0)
Basophils Absolute: 0 10*3/uL (ref 0.0–0.1)
EOS%: 2 % (ref 0.0–7.0)
Eosinophils Absolute: 0.1 10*3/uL (ref 0.0–0.5)
HCT: 33.7 % — ABNORMAL LOW (ref 38.4–49.9)
HEMOGLOBIN: 11.2 g/dL — AB (ref 13.0–17.1)
LYMPH%: 18.7 % (ref 14.0–49.0)
MCH: 35.3 pg — AB (ref 27.2–33.4)
MCHC: 33.2 g/dL (ref 32.0–36.0)
MCV: 106.3 fL — ABNORMAL HIGH (ref 79.3–98.0)
MONO#: 0.5 10*3/uL (ref 0.1–0.9)
MONO%: 7.3 % (ref 0.0–14.0)
NEUT%: 71.9 % (ref 39.0–75.0)
NEUTROS ABS: 5 10*3/uL (ref 1.5–6.5)
Platelets: 182 10*3/uL (ref 140–400)
RBC: 3.17 10*6/uL — ABNORMAL LOW (ref 4.20–5.82)
RDW: 13.3 % (ref 11.0–14.6)
WBC: 7 10*3/uL (ref 4.0–10.3)
lymph#: 1.3 10*3/uL (ref 0.9–3.3)

## 2016-12-28 LAB — COMPREHENSIVE METABOLIC PANEL
ALBUMIN: 3.3 g/dL — AB (ref 3.5–5.0)
ALK PHOS: 109 U/L (ref 40–150)
ALT: 20 U/L (ref 0–55)
AST: 23 U/L (ref 5–34)
Anion Gap: 5 mEq/L (ref 3–11)
BUN: 20.2 mg/dL (ref 7.0–26.0)
CO2: 24 mEq/L (ref 22–29)
CREATININE: 1.2 mg/dL (ref 0.7–1.3)
Calcium: 9.3 mg/dL (ref 8.4–10.4)
Chloride: 106 mEq/L (ref 98–109)
EGFR: 54 mL/min/{1.73_m2} — ABNORMAL LOW (ref >90)
GLUCOSE: 188 mg/dL — AB (ref 70–140)
POTASSIUM: 4.6 meq/L (ref 3.5–5.1)
SODIUM: 135 meq/L — AB (ref 136–145)
TOTAL PROTEIN: 7.2 g/dL (ref 6.4–8.3)
Total Bilirubin: 0.71 mg/dL (ref 0.20–1.20)

## 2016-12-28 NOTE — Progress Notes (Signed)
  Oncology Nurse Navigator Documentation      )Navigator Encounter Type: Initial MedOnc (12/28/16 1400)  Met with patient, sister and niece briefly to give them Med/Onc Contact information and to introduce myself. I encouraged them to call me with any questions or concerns.                     Patient Visit Type: MedOnc (12/28/16 1400)   Barriers/Navigation Needs: Coordination of Care (12/28/16 1400)         Education Method: Written;Verbal (12/28/16 1400)                Time Spent with Patient: 15 (12/28/16 1400)

## 2016-12-28 NOTE — Progress Notes (Signed)
Radiation Oncology         [phone] ________________________________  Name: [name]        MRN: [id number]  Date of Service: 12/28/2016 DOB: 08/31/1923  [id number], Jenny Reichmann, MD  Truitt Merle, MD     REFERRING PHYSICIAN: Truitt Merle, MD   DIAGNOSIS:  At least Stage IIA, T3N0Mx adenocarcinoma of the rectum.   HISTORY OF PRESENT ILLNESS: [name] is a 83 y.o. male seen at the request of Dr. [name] with a recent diagnosis of adenocarcinoma of the distal rectum. The patient has a history of Stage III right colon cancer in 1992 and underwent right colectomy. He developed recurrent disease in what sounds to be a mesenteric node and underwent re-excision followed by adjuvant chemotherapy and radiotherapy. He has been NED in the right colon following this. He developed pain and rectal bleeding a little over three months ago and saw Dr. Michail Sermon. He was unable to traverse the lesion due to poor prep and bleeding. Dr. [name] met with the patient and underwent an EUA with EUS and biopsies in the OR on 12/16/16 which revealed adenocarcinoma. The lesion measured 2x 2 cm involving the rectal sphincter circumferentially. No nodes were seen on EUS. A CT scan of the chest/abdomen/pelvis on 12/23/16 which revealed several lesions in the liver, the largest of these measured 1.7 cm. No perirectal adenopathy or evidence of other sites of disease were noted.  PREVIOUS RADIATION THERAPY: Yes   1990s: received radiotherapy for several weeks; the notes indicate this could have been at the site of recurrence in the mesentery following surgery.  PAST MEDICAL HISTORY:  Past Medical History:  Diagnosis Date  . Aortic stenosis, moderate cardiolgosit-  dr Cathie Olden   per last echo 10-15-2016 aortic stenosis somewhat worse since last echo 11-14-2015 moderate calcification and thickened leaflets, valve area 1.01cm^2,  mead gradiant 73mHg, peak grandiant 349mgf  . Barrett esophagus dx 2015  . Chronic stable  angina ([facility]  . Chronic systolic CHF (congestive heart failure) ([facility]ie Olden a. EF previously 35-40%. 03/ 2013 b. improved to 55-65% by cath 08/2013./  per echo 06/ 2018 ef 50-55%  . CKD (chronic kidney disease), stage III   . Constipation   . Coronary artery disease cardiologist-- dr naCathie Olden a. s/p PCI w/ DES to mLAD 08/05/10. b. NSTEMI 08/2013 (mildly elev trop) - cath showing widely patent stent, 80% prox D1 (small vessel) with recommendation for medical management, residual 60% ostial PDA, <20% LCx, LVEF 55-65%.   . Depression   . History of colon cancer, stage III oncologist- dr grWaymon Budge  per last note no recurrence 2013   dx 10/ 1992,  Stage III-- treament post right colecotmy, chemoradiation/  1993 localized recurrent mesenteric mass -- treatment post resection mass and chemoradiation  . History of kidney stones   . History of non-ST elevation myocardial infarction (NSTEMI)    08-14-2013  . History of small bowel obstruction    03/ 2005 partial SBO due to adhesions  post lysis adhesions and small bowel resection  . Hyperlipidemia   . Hypertension   . Macrocytic anemia dx 1992   chronic  . PAF (paroxysmal atrial fibrillation) ([facility]-  dr naCathie Olden a. failed TEE due to inability to pass probe into the esophagus in March 2013. b. On Coumadin. Was in NSR 08/2013 admission.  . Premature atrial contractions   . Premature ventricular contractions   . RBBB (right bundle branch block)   .  Rectal mass   . S/P drug eluting coronary stent placement 08/05/2010   DES x1  to midLAD  . Type II diabetes mellitus ([place])   . Wears glasses    Past Medical History:  Diagnosis Date  . Aortic stenosis, moderate cardiolgosit-  dr Cathie Olden   per last echo 10-15-2016 aortic stenosis somewhat worse since last echo 11-14-2015 moderate calcification and thickened leaflets, valve area 1.01cm^2,  mead gradiant 60mHg, peak grandiant 340mgf  . Barrett esophagus dx 2015  .  Chronic stable angina ([facility]  . Chronic systolic CHF (congestive heart failure) (HCAnthemfollowed by dr naCathie Olden a. EF previously 35-40%. 03/ 2013 b. improved to 55-65% by cath 08/2013./  per echo 06/ 2018 ef 50-55%  . CKD (chronic kidney disease), stage III   . Constipation   . Coronary artery disease cardiologist-- dr naCathie Olden a. s/p PCI w/ DES to mLAD 08/05/10. b. NSTEMI 08/2013 (mildly elev trop) - cath showing widely patent stent, 80% prox D1 (small vessel) with recommendation for medical management, residual 60% ostial PDA, <20% LCx, LVEF 55-65%.   . Depression   . History of colon cancer, stage III oncologist- dr grWaymon Budge  per last note no recurrence 2013   dx 10/ 1992,  Stage III-- treament post right colecotmy, chemoradiation/  1993 localized recurrent mesenteric mass -- treatment post resection mass and chemoradiation  . History of kidney stones   . History of non-ST elevation myocardial infarction (NSTEMI)    08-14-2013  . History of small bowel obstruction    03/ 2005 partial SBO due to adhesions  post lysis adhesions and small bowel resection  . Hyperlipidemia   . Hypertension   . Macrocytic anemia dx 1992   chronic  . PAF (paroxysmal atrial fibrillation) ([facility]-  dr naCathie Olden a. failed TEE due to inability to pass probe into the esophagus in March 2013. b. On Coumadin. Was in NSR 08/2013 admission.  . Premature atrial contractions   . Premature ventricular contractions   . RBBB (right bundle branch block)   . Rectal mass   . S/P drug eluting coronary stent placement 08/05/2010   DES x1  to midLAD  . Type II diabetes mellitus ([facility]  . Wears glasses        PAST SURGICAL HISTORY: Past Surgical History:  Procedure Laterality Date  . ABDOMINAL MASS RESECTION  11/1991   localized recurrent colon cancer mesenteric lymph node mass (area of distal aorta and vena cava) and debulking  . CARDIAC CATHETERIZATION  08/03/10   dr stLia Foyer preserved LVSF, mod. high grade  stenosis LAD, diagonal stenosis at the bend 60-70%  . CARDIAC CATHETERIZATION  08-09-2016  dr joMartinique ostialLAD 30%, wide patent mLAD stent, pCFX 40%,  mild irregularities throughout RCA < 20%  . CARDIAC CATHETERIZATION N/A 05/20/2016   Procedure: Left Heart Cath and Coronary Angiography;  Surgeon: JoLorretta HarpMD;  [facility];  Service: Cardiovascular;  Laterality: N/A; normal LVF,  D2 80%.  ostRCA 50%,  LAD stent patent  . CARDIOVASCULAR STRESS TEST  09-01-2016   dr naCathie Olden Intermediate risk nuclear study w/ medium non-reversible defect of moderate severity in the basal inferolateral and mid inferolateral location/ no ST segment deviation noted during stress,  nuclear stress ef 48% (45-54%)  . CATARACT EXTRACTION W/ INTRAOCULAR LENS  IMPLANT, BILATERAL    . CORONARY ANGIOPLASTY WITH STENT PLACEMENT  08/05/10   dr stLia Foyer  PCI and DES x1  LAD  . CYSTO/  URETEROSCOPIC STONE EXTRACTION Left 10/18/2002  . CYSTOSCOPY WITH RETROGRADE PYELOGRAM, URETEROSCOPY AND STENT PLACEMENT Bilateral 05/18/2013   Procedure: CYSTOSCOPY WITH BILATERAL RETROGRADE PYELOGRAM, LEFT URETEROSCOPY AND LEFT STENT PLACEMENT;  Surgeon: Alexis Frock, MD;  Location: [facility];  Service: Urology;  Laterality: Bilateral;  . EXPLORATORY LAPAROTOMY W/ LYSIS ADHESIONS AND SMALL BOWEL RESECTION  07/09/2003   partial SBO  . FLEXIBLE SIGMOIDOSCOPY N/A 10/07/2016   Procedure: FLEXIBLE SIGMOIDOSCOPY;  Surgeon: Wilford Corner, MD;  Location: [facility] ENDOSCOPY;  Service: Endoscopy;  Laterality: N/A;  . LAPAROSCOPIC CHOLECYSTECTOMY  12-04-2009  dr Margot Chimes  . LEFT HEART CATHETERIZATION WITH CORONARY ANGIOGRAM N/A 08/15/2013   Procedure: LEFT HEART CATHETERIZATION WITH CORONARY ANGIOGRAM;  Surgeon: Peter M Martinique, MD;  Location: [facility] CATH LAB;  Service: Cardiovascular;  Laterality: N/A; single vessel obstructive CAD involving a small diagonal branch, patent LAD stent , normal LVF  . RIGHT COLECTOMY  03/1991   w/ appendectomy  .  TONSILLECTOMY    . TRANSTHORACIC ECHOCARDIOGRAM  10-15-2016  dr Cathie Olden   grade 1 diastolic dysfunction, ef 79-89%/  moderate AV stenosis (valve area 1.012cm^2, mean gradiant 40mHg, peak grandiant 350mg)/ moderate LAE/  trivial PR/  mild RAE     FAMILY HISTORY:  Family History  Problem Relation Age of Onset  . Cancer Mother        breast cancer   . Ulcers Father [id number]. Heart attack Brother [id number]   SOCIAL HISTORY:  reports that he quit smoking about 24 years ago. His smoking use included Cigarettes. He has a 39.00 pack-year smoking history. He has never used smokeless tobacco. He reports that he drinks about 1.2 oz of alcohol per week . He reports that he does not use drugs. The [place] He is accompanied by his sister and niece. He used to own a gift shop at [employer]  ALLERGIES: Metformin and related   MEDICATIONS:  Current Outpatient Prescriptions  Medication Sig Dispense Refill  . amLODipine (NORVASC) 5 MG tablet Take 5 mg by mouth every morning.    . Marland Kitchenspirin EC 81 MG tablet Take 1 tablet (81 mg total) by mouth daily. 30 tablet 0  . atorvastatin (LIPITOR) 40 MG tablet TAKE ONE TABLET BY MOUTH ONCE DAILY AT 6 PM (Patient taking differently: TAKE ONE TABLET BY MOUTH ONCE DAILY AT 6 PM---) 90 tablet 3  . B Complex-C (B-COMPLEX WITH VITAMIN C) tablet Take 1 tablet by mouth daily.    . carvedilol (COREG) 6.25 MG tablet Take 6.25 mg by mouth 2 (two) times daily with a meal.    . Cholecalciferol (VITAMIN D-3 PO) Take 1 tablet by mouth daily.     . Marland Kitchenonepezil (ARICEPT) 10 MG tablet Take 10 mg by mouth at bedtime.     . Marland Kitchenlimepiride (AMARYL) 1 MG tablet Take 1 mg by mouth daily with breakfast.     . isosorbide mononitrate (IMDUR) 120 MG 24 hr tablet Take 1 tablet (120 mg total) by mouth daily. (Patient taking differently: Take 120 mg by mouth every morning. ) 30 tablet 1  . linagliptin (TRADJENTA) 5 MG TABS tablet Take 1 tablet  (5 mg total) by mouth daily. For blood sugar (Patient taking differently: Take 5 mg by mouth every morning. For blood sugar) 30 tablet 1  . Propylene Glycol (SYSTANE BALANCE) 0.6 % SOLN Apply 1-2 drops to eye 3 (three) times daily as needed (  for dry eyes).    . RANEXA 1000 MG SR tablet TAKE ONE TABLET BY MOUTH TWICE DAILY 180 tablet 3  . nitroGLYCERIN (NITROSTAT) 0.4 MG SL tablet PLACE ONE TABLET UNDER THE TONGUE EVERY 5 MINUTES AS NEEDED FOR CHEST PAIN. 25 tablet 23  . oxyCODONE (OXY IR/ROXICODONE) 5 MG immediate release tablet Take 1 tablet (5 mg total) by mouth every 6 (six) hours as needed. 20 tablet 0   No current facility-administered medications for this encounter.      REVIEW OF SYSTEMS: On review of systems, the patient reports that he is doing well as he can expect. He denies any chest pain, shortness of breath, cough, fevers, chills, night sweats, unintended weight changes. He denies any  bladder disturbances, and denies abdominal pain, nausea or vomiting. He has rectal bleeding with most bowel movements, and has discomfort with defecating. He reports 4-5 stools per day at this time. denies any new musculoskeletal or joint aches or pains. A complete review of systems is obtained and is otherwise negative.     PHYSICAL EXAM:  Wt Readings from Last 3 Encounters:  12/28/16 141 lb 9.6 oz (64.2 kg)  12/28/16 140 lb (63.5 kg)  12/16/16 140 lb (63.5 kg)   Temp Readings from Last 3 Encounters:  12/28/16 98 F (36.7 C) (Oral)  12/28/16 98 F (36.7 C) (Oral)  12/16/16 (!) 97 F (36.1 C)   BP Readings from Last 3 Encounters:  12/28/16 (!) 104/46  12/28/16 (!) 118/49  12/16/16 (!) 153/61   Pulse Readings from Last 3 Encounters:  12/28/16 (!) 49  12/28/16 72  12/16/16 62   Pain Assessment Pain Score: 6 /10  In general this is a well appearing Caucasian male in no acute distress. He is alert and oriented x4 and appropriate throughout the examination. HEENT reveals that the  patient is normocephalic, atraumatic. EOMs are intact. PERRLA. Skin is intact without any evidence of gross lesions. Cardiovascular exam reveals a regular rate and rhythm, no clicks rubs or murmurs are auscultated. Chest is clear to auscultation bilaterally. Lymphatic assessment is performed and does not reveal any adenopathy in the cervical, supraclavicular, axillary, or inguinal chains. Abdomen has active bowel sounds in all quadrants and is intact. The abdomen is soft, non tender, non distended. Lower extremities are negative for pretibial pitting edema, deep calf tenderness, cyanosis or clubbing. DRE reveals palpable tumor circumferentially just above the dentate line consistent with his known tumor.  ECOG = 1  0 - Asymptomatic (Fully active, able to carry on all predisease activities without restriction)  1 - Symptomatic but completely ambulatory (Restricted in physically strenuous activity but ambulatory and able to carry out work of a light or sedentary nature. For example, light housework, office work)  2 - Symptomatic, <50% in bed during the day (Ambulatory and capable of all self care but unable to carry out any work activities. Up and about more than 50% of waking hours)  3 - Symptomatic, >50% in bed, but not bedbound (Capable of only limited self-care, confined to bed or chair 50% or more of waking hours)  4 - Bedbound (Completely disabled. Cannot carry on any self-care. Totally confined to bed or chair)  5 - Death   Eustace Pen MM, Creech RH, Tormey DC, et al. [phone]). "Toxicity and response criteria of the [facility]". [place] Oncol. 5 (6): 649-55    LABORATORY DATA:  Lab Results  Component Value Date   WBC 7.0 12/28/2016   HGB  11.2 (L) 12/28/2016   HCT 33.7 (L) 12/28/2016   MCV 106.3 (H) 12/28/2016   PLT 182 12/28/2016   Lab Results  Component Value Date   NA 135 (L) 12/28/2016   K 4.6 12/28/2016   CL 105 11/18/2016   CO2 24 12/28/2016   Lab  Results  Component Value Date   ALT 20 12/28/2016   AST 23 12/28/2016   ALKPHOS 109 12/28/2016   BILITOT 0.71 12/28/2016      RADIOGRAPHY: Ct Abdomen Pelvis Wo Contrast  Result Date: 12/24/2016 CLINICAL DATA:  Left posterior rectal mass. Remote history of stage III colon cancer. EXAM: CT CHEST, ABDOMEN AND PELVIS WITHOUT CONTRAST TECHNIQUE: Multidetector CT imaging of the chest, abdomen and pelvis was performed following the standard protocol without IV contrast. COMPARISON:  Abdomen and pelvis CT 05/08/2013 FINDINGS: CT CHEST FINDINGS Cardiovascular: Cardiopericardial silhouette is at upper limits of normal for size. Coronary artery calcification is noted. Atherosclerotic calcification is noted in the wall of the thoracic aorta. Mediastinum/Nodes: Upper normal mediastinal lymph nodes are evident, some of which show dense dystrophic calcification compatible with prior granulomatous disease. No evidence for gross hilar lymphadenopathy although assessment is limited by the lack of intravenous contrast on today's study. The esophagus has normal imaging features. There is no axillary lymphadenopathy. Lungs/Pleura: Fairly advanced changes of underlying chronic interstitial lung disease with areas of centrilobular paraseptal emphysema. There is marked bronchial wall thickening with bilateral cylindrical bronchiectasis. Advanced changes in the right upper lobe show bullous change, septal thickening and areas of dependent fluid within large thin walled air cysts. Scattered areas of apparent subpleural honeycombing are noted in the lungs bilaterally. Musculoskeletal: Bone windows reveal no worrisome lytic or sclerotic osseous lesions. CT ABDOMEN PELVIS FINDINGS Hepatobiliary: Multiple ill-defined low-density lesions are seen distributed through both hepatic lobes, new since the prior study. Index lesion in the posterior right hepatic dome (image 87 series 2) measures 1.7 cm. A second discrete lesion identified in  the posterior right liver (image 98) measures 2.4 cm. Medial segment left liver lesions seen on image 105 measures 1.8 cm. Gallbladder surgically absent. No intrahepatic or extrahepatic biliary dilation. Pancreas: The No focal mass lesion. No dilatation of the main duct. No intraparenchymal cyst. No peripancreatic edema. Spleen: Calcified granulomata. Adrenals/Urinary Tract: No adrenal nodule or mass. Several stones are seen in the right kidney with dense atherosclerotic calcification noted in artery is of the renal hilum. Dominant stone is in the lower pole measuring up to 7 mm maximum diameter without evidence for obstruction. Left kidney also demonstrates multiple stones, the largest of which is nonobstructing in a lower pole calyx and measures 12 x 13 x 12 mm. 2.6 cm lesion in the lower pole of the left kidney approaches water attenuation and is likely a cyst. Other smaller low-density lesions in the left kidney measure about 10 mm and have near water attenuation, also likely cysts. No evidence for hydroureter. The urinary bladder appears normal for the degree of distention. Stomach/Bowel: Stomach is nondistended. No gastric wall thickening. No evidence of outlet obstruction. Duodenum is normally positioned as is the ligament of Treitz. No small bowel wall thickening. No small bowel dilatation. Patient is status post right hemicolectomy with unremarkable appearing ileocolic anastomosis. Diverticular changes are noted in the left colon without evidence of diverticulitis. Prominent soft tissue fullness in the distal rectum near the anal verge presumably reflects known neoplasm. Vascular/Lymphatic: There is abdominal aortic atherosclerosis without aneurysm. Maximum aortic diameter measured at 2.9 cm. There is no  gastrohepatic or hepatoduodenal ligament lymphadenopathy. No intraperitoneal or retroperitoneal lymphadenopathy. No pelvic sidewall lymphadenopathy. Specifically, no evidence for perirectal lymphadenopathy.  Reproductive: The prostate gland and seminal vesicles have normal imaging features. Other: No intraperitoneal free fluid. Musculoskeletal: Bilateral groin hernias contain only fat. Bone windows reveal no worrisome lytic or sclerotic osseous lesions. IMPRESSION: 1. Prominent soft tissue fullness distal rectum near the anal verge presumably reflects known neoplasm. No evidence for perirectal lymphadenopathy. 2. Multiple ill-defined low-density lesions in the liver, new since prior study of 2014. These have day, ill-defined margins and are highly suspicious for metastatic disease. Dominant lesion measures up to 2.4 cm. 3. Advanced chronic changes in the lungs with areas of marked bullous change in the right upper lobe, some of the bullous containing fluid levels. There is diffuse bronchial wall thickening, bronchiectasis, and features suggesting underlying UIP. 4.  Aortic Atherosclerois (ICD10-170.0) Electronically Signed   By: Kennith Center M.D.   On: 12/24/2016 08:25   Ct Chest Wo Contrast  Result Date: 12/24/2016 CLINICAL DATA:  Left posterior rectal mass. Remote history of stage III colon cancer. EXAM: CT CHEST, ABDOMEN AND PELVIS WITHOUT CONTRAST TECHNIQUE: Multidetector CT imaging of the chest, abdomen and pelvis was performed following the standard protocol without IV contrast. COMPARISON:  Abdomen and pelvis CT 05/08/2013 FINDINGS: CT CHEST FINDINGS Cardiovascular: Cardiopericardial silhouette is at upper limits of normal for size. Coronary artery calcification is noted. Atherosclerotic calcification is noted in the wall of the thoracic aorta. Mediastinum/Nodes: Upper normal mediastinal lymph nodes are evident, some of which show dense dystrophic calcification compatible with prior granulomatous disease. No evidence for gross hilar lymphadenopathy although assessment is limited by the lack of intravenous contrast on today's study. The esophagus has normal imaging features. There is no axillary  lymphadenopathy. Lungs/Pleura: Fairly advanced changes of underlying chronic interstitial lung disease with areas of centrilobular paraseptal emphysema. There is marked bronchial wall thickening with bilateral cylindrical bronchiectasis. Advanced changes in the right upper lobe show bullous change, septal thickening and areas of dependent fluid within large thin walled air cysts. Scattered areas of apparent subpleural honeycombing are noted in the lungs bilaterally. Musculoskeletal: Bone windows reveal no worrisome lytic or sclerotic osseous lesions. CT ABDOMEN PELVIS FINDINGS Hepatobiliary: Multiple ill-defined low-density lesions are seen distributed through both hepatic lobes, new since the prior study. Index lesion in the posterior right hepatic dome (image 87 series 2) measures 1.7 cm. A second discrete lesion identified in the posterior right liver (image 98) measures 2.4 cm. Medial segment left liver lesions seen on image 105 measures 1.8 cm. Gallbladder surgically absent. No intrahepatic or extrahepatic biliary dilation. Pancreas: The No focal mass lesion. No dilatation of the main duct. No intraparenchymal cyst. No peripancreatic edema. Spleen: Calcified granulomata. Adrenals/Urinary Tract: No adrenal nodule or mass. Several stones are seen in the right kidney with dense atherosclerotic calcification noted in artery is of the renal hilum. Dominant stone is in the lower pole measuring up to 7 mm maximum diameter without evidence for obstruction. Left kidney also demonstrates multiple stones, the largest of which is nonobstructing in a lower pole calyx and measures 12 x 13 x 12 mm. 2.6 cm lesion in the lower pole of the left kidney approaches water attenuation and is likely a cyst. Other smaller low-density lesions in the left kidney measure about 10 mm and have near water attenuation, also likely cysts. No evidence for hydroureter. The urinary bladder appears normal for the degree of distention.  Stomach/Bowel: Stomach is nondistended. No gastric wall  thickening. No evidence of outlet obstruction. Duodenum is normally positioned as is the ligament of Treitz. No small bowel wall thickening. No small bowel dilatation. Patient is status post right hemicolectomy with unremarkable appearing ileocolic anastomosis. Diverticular changes are noted in the left colon without evidence of diverticulitis. Prominent soft tissue fullness in the distal rectum near the anal verge presumably reflects known neoplasm. Vascular/Lymphatic: There is abdominal aortic atherosclerosis without aneurysm. Maximum aortic diameter measured at 2.9 cm. There is no gastrohepatic or hepatoduodenal ligament lymphadenopathy. No intraperitoneal or retroperitoneal lymphadenopathy. No pelvic sidewall lymphadenopathy. Specifically, no evidence for perirectal lymphadenopathy. Reproductive: The prostate gland and seminal vesicles have normal imaging features. Other: No intraperitoneal free fluid. Musculoskeletal: Bilateral groin hernias contain only fat. Bone windows reveal no worrisome lytic or sclerotic osseous lesions. IMPRESSION: 1. Prominent soft tissue fullness distal rectum near the anal verge presumably reflects known neoplasm. No evidence for perirectal lymphadenopathy. 2. Multiple ill-defined low-density lesions in the liver, new since prior study of 2014. These have day, ill-defined margins and are highly suspicious for metastatic disease. Dominant lesion measures up to 2.4 cm. 3. Advanced chronic changes in the lungs with areas of marked bullous change in the right upper lobe, some of the bullous containing fluid levels. There is diffuse bronchial wall thickening, bronchiectasis, and features suggesting underlying UIP. 4.  Aortic Atherosclerois (ICD10-170.0) Electronically Signed   By: Misty Stanley M.D.   On: 12/24/2016 08:25       IMPRESSION/PLAN: 1. At least Stage IIA, T3N0Mx adenocarcinoma of the rectum. Dr. Lisbeth Renshaw discusses the  pathology findings and reviews the nature of invasive rectal disease. Dr. Lisbeth Renshaw discusses the use of concurrent chemotherapy and radiotherapy. Given the findings on his CT scan, we will discuss this further at GI conference. There will likely be a role for biopsy of the liver. We will follow up with this discussion after tomorrow morning.  We discussed the risks, benefits, short, and long term effects of radiotherapy, and the patient is interested in proceeding. Dr. Lisbeth Renshaw discusses the delivery and logistics of radiotherapy and would offer the patient about 6 weeks of treatment with concurrent chemotherapy. We will have him proceed with simulation later today at 3pm after he sees Dr. Burr Medico. Dr. Burr Medico will likely plan an MRI of the liver or U/S guided biopsy of the liver to document if he has metastatic disease to the liver. We would still plan on radiotherapy due to symptomatology even if stage IV disease is noted.  The above documentation reflects my direct findings during this shared patient visit. Please see the separate note by Dr. Lisbeth Renshaw on this date for the remainder of the patient's plan of care.    Carola Rhine, PAC   This document serves as a record of services personally performed by Kyung Rudd MD and Shona Simpson, PA-C. It was created on their behalf by Delton Coombes, a trained medical scribe. The creation of this record is based on the scribe's personal observations and the provider's statements to them. This document has been checked and approved by the attending provider.

## 2016-12-28 NOTE — Progress Notes (Signed)
[place]  [phone] [phone] [place] Note   Patient Care Team: Lavone Orn, MD as PCP - General (Internal Medicine) 12/28/2016  CHIEF COMPLAINTS/PURPOSE OF CONSULTATION:  Consultation for Rectal Cancer  Oncology History   Cancer Staging Rectal cancer [facility]) Staging form: Colon and Rectum, AJCC 8th Edition - Clinical: Stage IIA (cT3, cN0, cM0) - Unsigned       Rectal cancer ([place])   10/06/2016 - 10/09/2016 Hospital Admission    Admit date: 10/06/16 Admission diagnosis: Rectal Bleeding  Additional comments: Has Flexible Sigmoidoscopy      10/07/2016 Pathology Results    Diagnosis 10/07/16 Rectum, biopsy TUBULOVILLOUS ADENOMA WITH FOCAL HIGH GRADE DYSPLASIA      10/07/2016 Procedure    FLEXIBLE SIGMOIDOSCOPY by Dr. Michail Sermon       12/16/2016 Procedure    Dr [name] Performed an EUS in the OR  The patient was identified in the holding area and taken to the procedure room where they were laid in lithotomy position on the exam room table.  The patient was anesthetized per the anesthesia team. The patient was then prepped and draped in the usual sterile fashion. A timeout was performed indicating the correct patient and procedure. A rectal block was performed using Marcaine mixed with epinephrine and lysosomal bupivacaine. I began by performing a digital rectal exam. There was a mass located in the left posterior lateral anal canal clearly involving the sphincter complex. This appeared to originate around the dentate line. The mass was approximately 2 x 2 centimeters. A Tru-Cut needle was used to take core biopsies of the mass to sent to pathology.   The US probe was inserted without difficulty.  The levators were identified and the probe was withdrawn slowly. I identified the prostate and seminal vesicles anteriorly. The internal sphincter was identified and followed distally. There was a mass noted within the anal canal that traversed  the internal and external sphincter complex.  Once this was complete a dressing was applied. The patient was awakened from anesthesia and sent to the postanesthesia care unit in stable condition. All counts were correct per operating room staff.      12/16/2016 Pathology Results    Diagnosis 12/16/16 Anus, biopsy, canal mass - ADENOCARCINOMA - SEE COMMENT Microscopic Comment The biopsy material is limited with a small focus of invasive adenocarcinoma; additional studies are not performed due to exhausted material. Dr. [name] was notified of these results on December 17, 2016. Dr. Gari Crown reviewed the case and agrees with the diagnosis.      12/16/2016 Initial Diagnosis    Rectal cancer ([place])      12/23/2016 Imaging    CT CAP WO Contrast 12/23/16 IMPRESSION: 1. Prominent soft tissue fullness distal rectum near the anal verge presumably reflects known neoplasm. No evidence for perirectal lymphadenopathy. 2. Multiple ill-defined low-density lesions in the liver, new since prior study of 2014. These have day, ill-defined margins and are highly suspicious for metastatic disease. Dominant lesion measures up to 2.4 cm. 3. Advanced chronic changes in the lungs with areas of marked bullous change in the right upper lobe, some of the bullous containing fluid levels. There is diffuse bronchial wall thickening, bronchiectasis, and features suggesting underlying UIP. 4.  Aortic Atherosclerois (ICD10-170.0)        HISTORY OF PRESENTING ILLNESS: 12/28/16  [name] 83 y.o. male is here because of newly diagnosed rectal cancer. He presents to the clinic today with sister and neice.  He was  referred by Dr. [name].  He had some constipation and bloody stool. He went to hospital where Dr. Michail Sermon did a sigmoidoscopy 10/07/16 and Dr. [name] had a EUS on 12/16/16.   In the past he had one stent placed. He has MD and HTN and history of colon cancer 25/26 years ago. He did not get anything done after  surgery. 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 Mother had breast cancer at old age. He has no children.   Today they have seen Dr. Lisbeth Renshaw for consultation and simulation.  He reports to still have rectal bleeding every time he goes to bathroom with a little blood. His BM are fine with occasional constipation and more diarrhea. He tried MiraLAX. He has lost 20 pounds over the last couple of months. He reports to still eating well. He is not sure why he is losing weight. He is able to physically take care of himself at home. He lives alone and does all his housework and goes grocery shopping. He does not use wheel chair. He has trouble walking and with balance because of shoulder problems. He takes nitro for occassional chest pain, not much lately. He manages his own medication. He was prescribed Oxycodone after last procedure and he has not really taken it.  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    MEDICAL HISTORY:  Past Medical History:  Diagnosis Date  . Aortic stenosis, moderate cardiolgosit-  dr Cathie Olden   per last echo 10-15-2016 aortic stenosis somewhat worse since last echo 11-14-2015 moderate calcification and thickened leaflets, valve area 1.01cm^2,  mead gradiant 13mmHg, peak grandiant 56mmHgf  . Barrett esophagus dx 2015  . Chronic stable angina ([place])   . Chronic systolic CHF (congestive heart failure) ([place]) followed by dr Cathie Olden   a. EF previously 35-40%. 03/ 2013 b. improved to 55-65% by cath 08/2013./  per echo 06/ 2018 ef 50-55%  . CKD (chronic kidney disease), stage III   . Constipation   . Coronary artery disease cardiologist-- dr Cathie Olden   a. s/p PCI w/ DES to mLAD 08/05/10. b. NSTEMI 08/2013 (mildly elev trop) - cath showing widely patent stent, 80% prox D1 (small vessel) with recommendation for medical management, residual 60% ostial PDA, <20% LCx, LVEF 55-65%.   . Depression   . History of colon cancer, stage  III oncologist- dr Waymon Budge--  per last note no recurrence 2013   dx 10/ 1992,  Stage III-- treament post right colecotmy, chemoradiation/  1993 localized recurrent mesenteric mass -- treatment post resection mass and chemoradiation  . History of kidney stones   . History of non-ST elevation myocardial infarction (NSTEMI)    08-14-2013  . History of small bowel obstruction    03/ 2005 partial SBO due to adhesions  post lysis adhesions and small bowel resection  . Hyperlipidemia   . Hypertension   . Macrocytic anemia dx 1992   chronic  . PAF (paroxysmal atrial fibrillation) [facility]) cardiologist-  dr Cathie Olden   a. failed TEE due to inability to pass probe into the esophagus in March 2013. b. On Coumadin. Was in NSR 08/2013 admission.  . Premature atrial contractions   . Premature ventricular contractions   . RBBB (right bundle branch block)   . Rectal mass   . S/P drug eluting coronary stent placement 08/05/2010   DES x1  to midLAD  . Type II diabetes mellitus ([place])   . Wears glasses  SURGICAL HISTORY: Past Surgical History:  Procedure Laterality Date  . ABDOMINAL MASS RESECTION  11/1991   localized recurrent colon cancer mesenteric lymph node mass (area of distal aorta and vena cava) and debulking  . CARDIAC CATHETERIZATION  08/03/10   dr Lia Foyer   preserved LVSF, mod. high grade stenosis LAD, diagonal stenosis at the bend 60-70%  . CARDIAC CATHETERIZATION  08-09-2016  dr Martinique   ostialLAD 30%, wide patent mLAD stent, pCFX 40%,  mild irregularities throughout RCA < 20%  . CARDIAC CATHETERIZATION N/A 05/20/2016   Procedure: Left Heart Cath and Coronary Angiography;  Surgeon: Lorretta Harp, MD;  Location: [place] CV LAB;  Service: Cardiovascular;  Laterality: N/A; normal LVF,  D2 80%.  ostRCA 50%,  LAD stent patent  . CARDIOVASCULAR STRESS TEST  09-01-2016   dr Cathie Olden   Intermediate risk nuclear study w/ medium non-reversible defect of moderate severity in the basal  inferolateral and mid inferolateral location/ no ST segment deviation noted during stress,  nuclear stress ef 48% (45-54%)  . CATARACT EXTRACTION W/ INTRAOCULAR LENS  IMPLANT, BILATERAL    . CORONARY ANGIOPLASTY WITH STENT PLACEMENT  08/05/10   dr Lia Foyer   PCI and DES x1  LAD  . CYSTO/  URETEROSCOPIC STONE EXTRACTION Left 10/18/2002  . CYSTOSCOPY WITH RETROGRADE PYELOGRAM, URETEROSCOPY AND STENT PLACEMENT Bilateral 05/18/2013   Procedure: CYSTOSCOPY WITH BILATERAL RETROGRADE PYELOGRAM, LEFT URETEROSCOPY AND LEFT STENT PLACEMENT;  Surgeon: Alexis Frock, MD;  Location: [facility] ORS;  Service: Urology;  Laterality: Bilateral;  . EXPLORATORY LAPAROTOMY W/ LYSIS ADHESIONS AND SMALL BOWEL RESECTION  07/09/2003   partial SBO  . FLEXIBLE SIGMOIDOSCOPY N/A 10/07/2016   Procedure: FLEXIBLE SIGMOIDOSCOPY;  Surgeon: Wilford Corner, MD;  Location: [facility] ENDOSCOPY;  Service: Endoscopy;  Laterality: N/A;  . LAPAROSCOPIC CHOLECYSTECTOMY  12-04-2009  dr Margot Chimes  . LEFT HEART CATHETERIZATION WITH CORONARY ANGIOGRAM N/A 08/15/2013   Procedure: LEFT HEART CATHETERIZATION WITH CORONARY ANGIOGRAM;  Surgeon: Peter M Martinique, MD;  Location: [facility] CATH LAB;  Service: Cardiovascular;  Laterality: N/A; single vessel obstructive CAD involving a small diagonal branch, patent LAD stent , normal LVF  . RIGHT COLECTOMY  03/1991   w/ appendectomy  . TONSILLECTOMY    . TRANSTHORACIC ECHOCARDIOGRAM  10-15-2016  dr Cathie Olden   grade 1 diastolic dysfunction, ef 03-47%/  moderate AV stenosis (valve area 1.012cm^2, mean gradiant 52mmHg, peak grandiant 15mmHg)/ moderate LAE/  trivial PR/  mild RAE    SOCIAL HISTORY: Social History   Social History  . Marital status: Single    Spouse name: N/A  . Number of children: 0  . Years of education: N/A   Occupational History  . Retired    Social History Main Topics  . Smoking status: Former Smoker    Packs/day: 1.00    Years: 39.00    Types: Cigarettes    Quit date: 05/10/1992  . Smokeless  tobacco: Never Used  . Alcohol use 1.2 oz/week    2 Shots of liquor per week     Comment: occasionally.  . Drug use: No  . Sexual activity: No   Other Topics Concern  . Not on file   Social History Narrative   Lives alone.      FAMILY HISTORY: Family History  Problem Relation Age of Onset  . Cancer Mother        breast cancer   . Ulcers Father 42  . Heart attack Brother 26    ALLERGIES:  is allergic to metformin and related.  MEDICATIONS:  Current Outpatient Prescriptions  Medication Sig Dispense Refill  . amLODipine (NORVASC) 5 MG tablet Take 5 mg by mouth every morning.    [name] aspirin EC 81 MG tablet Take 1 tablet (81 mg total) by mouth daily. 30 tablet 0  . atorvastatin (LIPITOR) 40 MG tablet TAKE ONE TABLET BY MOUTH ONCE DAILY AT 6 PM (Patient taking differently: TAKE ONE TABLET BY MOUTH ONCE DAILY AT 6 PM---) 90 tablet 3  . B Complex-C (B-COMPLEX WITH VITAMIN C) tablet Take 1 tablet by mouth daily.    . carvedilol (COREG) 6.25 MG tablet Take 6.25 mg by mouth 2 (two) times daily with a meal.    . Cholecalciferol (VITAMIN D-3 PO) Take 1 tablet by mouth daily.     [name] donepezil (ARICEPT) 10 MG tablet Take 10 mg by mouth at bedtime.     [name] glimepiride (AMARYL) 1 MG tablet Take 1 mg by mouth daily with breakfast.     . isosorbide mononitrate (IMDUR) 120 MG 24 hr tablet Take 1 tablet (120 mg total) by mouth daily. (Patient taking differently: Take 120 mg by mouth every morning. ) 30 tablet 1  . linagliptin (TRADJENTA) 5 MG TABS tablet Take 1 tablet (5 mg total) by mouth daily. For blood sugar (Patient taking differently: Take 5 mg by mouth every morning. For blood sugar) 30 tablet 1  . nitroGLYCERIN (NITROSTAT) 0.4 MG SL tablet PLACE ONE TABLET UNDER THE TONGUE EVERY 5 MINUTES AS NEEDED FOR CHEST PAIN. 25 tablet 23  . oxyCODONE (OXY IR/ROXICODONE) 5 MG immediate release tablet Take 1 tablet (5 mg total) by mouth every 6 (six) hours as needed. 20 tablet 0  . Propylene Glycol (SYSTANE  BALANCE) 0.6 % SOLN Apply 1-2 drops to eye 3 (three) times daily as needed (for dry eyes).    . RANEXA 1000 MG SR tablet TAKE ONE TABLET BY MOUTH TWICE DAILY 180 tablet 3   No current facility-administered medications for this visit.     REVIEW OF SYSTEMS:   Constitutional: Denies fevers, chills or abnormal night sweats (+) weight loss Eyes: Denies blurriness of vision, double vision or watery eyes Ears, nose, mouth, throat, and face: Denies mucositis or sore throat Respiratory: Denies cough, dyspnea or wheezes Cardiovascular: Denies palpitation, chest discomfort or lower extremity swelling (+) occassaional chest pain (+) irregular heartbeat.   Gastrointestinal:  Denies nausea, heartburn (+) slight constipation/more diarrhea  (+) slight blood in stool  Skin: Denies abnormal skin rashes Lymphatics: Denies new lymphadenopathy or easy bruising Neurological:Denies numbness, tingling or new weaknesses  MSK: (+) shoulder problem  Behavioral/Psych: Mood is stable, no new changes  All other systems were reviewed with the patient and are negative.   PHYSICAL EXAMINATION: ECOG PERFORMANCE STATUS: 2 - Symptomatic, <50% confined to bed  Vitals:   12/28/16 1420  BP: (!) 118/49  Pulse: 72  Resp: 18  Temp: 98 F (36.7 C)  SpO2: 99%   Filed Weights   12/28/16 1420  Weight: 140 lb (63.5 kg)    GENERAL:alert, no distress and comfortable SKIN: skin color, texture, turgor are normal, no rashes or significant lesions EYES: normal, conjunctiva are pink and non-injected, sclera clear OROPHARYNX:no exudate, no erythema and lips, buccal mucosa, and tongue normal  NECK: supple, thyroid normal size, non-tender, without nodularity LYMPH:  no palpable lymphadenopathy in the cervical, axillary or inguinal LUNGS: clear to auscultation and percussion with normal breathing effort HEART: regular rate & rhythm and no murmurs and no lower extremity edema ABDOMEN:abdomen  soft, non-tender and normal bowel  sounds Musculoskeletal:no cyanosis of digits and no clubbing  PSYCH: alert & oriented x 3 with fluent speech NEURO: no focal motor/sensory deficits  LABORATORY DATA:  I have reviewed the data as listed CBC Latest Ref Rng & Units 12/28/2016 12/16/2016 11/18/2016  WBC 4.0 - 10.3 10e3/uL 7.0 - 6.5  Hemoglobin 13.0 - 17.1 g/dL 11.2(L) 11.6(L) 11.2(L)  Hematocrit 38.4 - 49.9 % 33.7(L) 34.0(L) 33.5(L)  Platelets 140 - 400 10e3/uL 182 - 205    CMP Latest Ref Rng & Units 12/28/2016 12/16/2016 11/18/2016  Glucose 70 - 140 mg/dl 188(H) 155(H) 161(H)  BUN 7.0 - 26.0 mg/dL 20.2 - 24(H)  Creatinine 0.7 - 1.3 mg/dL 1.2 - 1.51(H)  Sodium 136 - 145 mEq/L 135(L) 137 136  Potassium 3.5 - 5.1 mEq/L 4.6 4.4 4.2  Chloride 101 - 111 mmol/L - - 105  CO2 22 - 29 mEq/L 24 - 24  Calcium 8.4 - 10.4 mg/dL 9.3 - 9.3  Total Protein 6.4 - 8.3 g/dL 7.2 - -  Total Bilirubin 0.20 - 1.20 mg/dL 0.71 - -  Alkaline Phos 40 - 150 U/L 109 - -  AST 5 - 34 U/L 23 - -  ALT 0 - 55 U/L 20 - -    PATHOLOGY  Diagnosis 12/16/16 Anus, biopsy, canal mass - ADENOCARCINOMA - SEE COMMENT Microscopic Comment The biopsy material is limited with a small focus of invasive adenocarcinoma; additional studies are not performed due to exhausted material. Dr. [name] was notified of these results on December 17, 2016. Dr. Gari Crown reviewed the case and agrees with the diagnosis.   Diagnosis 10/07/16 Rectum, biopsy TUBULOVILLOUS ADENOMA WITH FOCAL HIGH GRADE DYSPLASIA   PROCEDURES  ECHO 10/15/16 Study Conclusions - Left ventricle: The cavity size was normal. Wall thickness was   normal. Systolic function was normal. The estimated ejection   fraction was in the range of 50% to 55%. Wall motion was normal;   there were no regional wall motion abnormalities. Doppler   parameters are consistent with abnormal left ventricular   relaxation (grade 1 diastolic dysfunction). - Aortic valve: Moderately calcified annulus. Moderately thickened,    moderately calcified leaflets. There was moderate stenosis. Valve   area (VTI): 1.01 cm^2. Valve area (Vmax): 1.04 cm^2. Valve area   (Vmean): 0.97 cm^2. - Left atrium: The atrium was moderately dilated. - Right atrium: The atrium was mildly dilated.  Flexible Sigmoidectomy 10/07/16 IMPRESSION - Firm lesion in anorectum with bleeding following DRE found on perianal exam. - Likely malignant tumor in the distal rectum. Biopsied. - Internal hemorrhoids. - Anal papilla(e) were hypertrophied. - Stool in the rectum, in the recto-sigmoid colon and in the sigmoid colon.    RADIOGRAPHIC STUDIES: I have personally reviewed the radiological images as listed and agreed with the findings in the report. Ct Abdomen Pelvis Wo Contrast  Result Date: 12/24/2016 CLINICAL DATA:  Left posterior rectal mass. Remote history of stage III colon cancer. EXAM: CT CHEST, ABDOMEN AND PELVIS WITHOUT CONTRAST TECHNIQUE: Multidetector CT imaging of the chest, abdomen and pelvis was performed following the standard protocol without IV contrast. COMPARISON:  Abdomen and pelvis CT 05/08/2013 FINDINGS: CT CHEST FINDINGS Cardiovascular: Cardiopericardial silhouette is at upper limits of normal for size. Coronary artery calcification is noted. Atherosclerotic calcification is noted in the wall of the thoracic aorta. Mediastinum/Nodes: Upper normal mediastinal lymph nodes are evident, some of which show dense dystrophic calcification compatible with prior granulomatous disease. No evidence for gross hilar lymphadenopathy although assessment  is limited by the lack of intravenous contrast on today's study. The esophagus has normal imaging features. There is no axillary lymphadenopathy. Lungs/Pleura: Fairly advanced changes of underlying chronic interstitial lung disease with areas of centrilobular paraseptal emphysema. There is marked bronchial wall thickening with bilateral cylindrical bronchiectasis. Advanced changes in the right upper  lobe show bullous change, septal thickening and areas of dependent fluid within large thin walled air cysts. Scattered areas of apparent subpleural honeycombing are noted in the lungs bilaterally. Musculoskeletal: Bone windows reveal no worrisome lytic or sclerotic osseous lesions. CT ABDOMEN PELVIS FINDINGS Hepatobiliary: Multiple ill-defined low-density lesions are seen distributed through both hepatic lobes, new since the prior study. Index lesion in the posterior right hepatic dome (image 87 series 2) measures 1.7 cm. A second discrete lesion identified in the posterior right liver (image 98) measures 2.4 cm. Medial segment left liver lesions seen on image 105 measures 1.8 cm. Gallbladder surgically absent. No intrahepatic or extrahepatic biliary dilation. Pancreas: The No focal mass lesion. No dilatation of the main duct. No intraparenchymal cyst. No peripancreatic edema. Spleen: Calcified granulomata. Adrenals/Urinary Tract: No adrenal nodule or mass. Several stones are seen in the right kidney with dense atherosclerotic calcification noted in artery is of the renal hilum. Dominant stone is in the lower pole measuring up to 7 mm maximum diameter without evidence for obstruction. Left kidney also demonstrates multiple stones, the largest of which is nonobstructing in a lower pole calyx and measures 12 x 13 x 12 mm. 2.6 cm lesion in the lower pole of the left kidney approaches water attenuation and is likely a cyst. Other smaller low-density lesions in the left kidney measure about 10 mm and have near water attenuation, also likely cysts. No evidence for hydroureter. The urinary bladder appears normal for the degree of distention. Stomach/Bowel: Stomach is nondistended. No gastric wall thickening. No evidence of outlet obstruction. Duodenum is normally positioned as is the ligament of Treitz. No small bowel wall thickening. No small bowel dilatation. Patient is status post right hemicolectomy with unremarkable  appearing ileocolic anastomosis. Diverticular changes are noted in the left colon without evidence of diverticulitis. Prominent soft tissue fullness in the distal rectum near the anal verge presumably reflects known neoplasm. Vascular/Lymphatic: There is abdominal aortic atherosclerosis without aneurysm. Maximum aortic diameter measured at 2.9 cm. There is no gastrohepatic or hepatoduodenal ligament lymphadenopathy. No intraperitoneal or retroperitoneal lymphadenopathy. No pelvic sidewall lymphadenopathy. Specifically, no evidence for perirectal lymphadenopathy. Reproductive: The prostate gland and seminal vesicles have normal imaging features. Other: No intraperitoneal free fluid. Musculoskeletal: Bilateral groin hernias contain only fat. Bone windows reveal no worrisome lytic or sclerotic osseous lesions. IMPRESSION: 1. Prominent soft tissue fullness distal rectum near the anal verge presumably reflects known neoplasm. No evidence for perirectal lymphadenopathy. 2. Multiple ill-defined low-density lesions in the liver, new since prior study of 2014. These have day, ill-defined margins and are highly suspicious for metastatic disease. Dominant lesion measures up to 2.4 cm. 3. Advanced chronic changes in the lungs with areas of marked bullous change in the right upper lobe, some of the bullous containing fluid levels. There is diffuse bronchial wall thickening, bronchiectasis, and features suggesting underlying UIP. 4.  Aortic Atherosclerois (ICD10-170.0) Electronically Signed   By: Misty Stanley M.D.   On: 12/24/2016 08:25   Ct Chest Wo Contrast  Result Date: 12/24/2016 CLINICAL DATA:  Left posterior rectal mass. Remote history of stage III colon cancer. EXAM: CT CHEST, ABDOMEN AND PELVIS WITHOUT CONTRAST TECHNIQUE: Multidetector CT imaging  of the chest, abdomen and pelvis was performed following the standard protocol without IV contrast. COMPARISON:  Abdomen and pelvis CT 05/08/2013 FINDINGS: CT CHEST FINDINGS  Cardiovascular: Cardiopericardial silhouette is at upper limits of normal for size. Coronary artery calcification is noted. Atherosclerotic calcification is noted in the wall of the thoracic aorta. Mediastinum/Nodes: Upper normal mediastinal lymph nodes are evident, some of which show dense dystrophic calcification compatible with prior granulomatous disease. No evidence for gross hilar lymphadenopathy although assessment is limited by the lack of intravenous contrast on today's study. The esophagus has normal imaging features. There is no axillary lymphadenopathy. Lungs/Pleura: Fairly advanced changes of underlying chronic interstitial lung disease with areas of centrilobular paraseptal emphysema. There is marked bronchial wall thickening with bilateral cylindrical bronchiectasis. Advanced changes in the right upper lobe show bullous change, septal thickening and areas of dependent fluid within large thin walled air cysts. Scattered areas of apparent subpleural honeycombing are noted in the lungs bilaterally. Musculoskeletal: Bone windows reveal no worrisome lytic or sclerotic osseous lesions. CT ABDOMEN PELVIS FINDINGS Hepatobiliary: Multiple ill-defined low-density lesions are seen distributed through both hepatic lobes, new since the prior study. Index lesion in the posterior right hepatic dome (image 87 series 2) measures 1.7 cm. A second discrete lesion identified in the posterior right liver (image 98) measures 2.4 cm. Medial segment left liver lesions seen on image 105 measures 1.8 cm. Gallbladder surgically absent. No intrahepatic or extrahepatic biliary dilation. Pancreas: The No focal mass lesion. No dilatation of the main duct. No intraparenchymal cyst. No peripancreatic edema. Spleen: Calcified granulomata. Adrenals/Urinary Tract: No adrenal nodule or mass. Several stones are seen in the right kidney with dense atherosclerotic calcification noted in artery is of the renal hilum. Dominant stone is in the  lower pole measuring up to 7 mm maximum diameter without evidence for obstruction. Left kidney also demonstrates multiple stones, the largest of which is nonobstructing in a lower pole calyx and measures 12 x 13 x 12 mm. 2.6 cm lesion in the lower pole of the left kidney approaches water attenuation and is likely a cyst. Other smaller low-density lesions in the left kidney measure about 10 mm and have near water attenuation, also likely cysts. No evidence for hydroureter. The urinary bladder appears normal for the degree of distention. Stomach/Bowel: Stomach is nondistended. No gastric wall thickening. No evidence of outlet obstruction. Duodenum is normally positioned as is the ligament of Treitz. No small bowel wall thickening. No small bowel dilatation. Patient is status post right hemicolectomy with unremarkable appearing ileocolic anastomosis. Diverticular changes are noted in the left colon without evidence of diverticulitis. Prominent soft tissue fullness in the distal rectum near the anal verge presumably reflects known neoplasm. Vascular/Lymphatic: There is abdominal aortic atherosclerosis without aneurysm. Maximum aortic diameter measured at 2.9 cm. There is no gastrohepatic or hepatoduodenal ligament lymphadenopathy. No intraperitoneal or retroperitoneal lymphadenopathy. No pelvic sidewall lymphadenopathy. Specifically, no evidence for perirectal lymphadenopathy. Reproductive: The prostate gland and seminal vesicles have normal imaging features. Other: No intraperitoneal free fluid. Musculoskeletal: Bilateral groin hernias contain only fat. Bone windows reveal no worrisome lytic or sclerotic osseous lesions. IMPRESSION: 1. Prominent soft tissue fullness distal rectum near the anal verge presumably reflects known neoplasm. No evidence for perirectal lymphadenopathy. 2. Multiple ill-defined low-density lesions in the liver, new since prior study of 2014. These have day, ill-defined margins and are highly  suspicious for metastatic disease. Dominant lesion measures up to 2.4 cm. 3. Advanced chronic changes in the lungs with areas of  marked bullous change in the right upper lobe, some of the bullous containing fluid levels. There is diffuse bronchial wall thickening, bronchiectasis, and features suggesting underlying UIP. 4.  Aortic Atherosclerois (ICD10-170.0) Electronically Signed   By: Misty Stanley M.D.   On: 12/24/2016 08:25    ASSESSMENT & PLAN:  [name] is a 83 y.o. caucasian male with a history of CHF, Kidney Stones, HTN, type 2 DM, HLD. and MI.    1. Rectal Cancer, Adenocarcinoma, Stage IV (cT3,cN0,cM1) -We discussed his EUS, CT scans and biopsy results in great details with pt and his family members.  -Unfortunately his CT scan reviewed multiple liver lesions, highly suspicious for liver metastasis from his rectal cancer. I recommend liver biopsy to confirm the metastasis. Patient is agreeable. I'll set up with interventional radiology for biopsy. -Patient is symptomatically from his rectal cancer, I recommend palliative radiation. He has been seen by Dr. Lisbeth Renshaw today, he is scheduled to start radiation next week. -Due to his advanced age, he is not a candidate for intensive chemotherapy. I discussed the single agent Xeloda with patient and his family member, I recommend him to try with concurrent radiation, and continue after radiation for his metastatic disease. --Chemotherapy consent: Side effects including but does not not limited to, fatigue, nausea, vomiting, diarrhea, hair loss, neuropathy, fluid retention, renal and kidney dysfunction, neutropenic fever, needed for blood transfusion, bleeding, coronary artery spasm and heart attack, heart failure, were discussed with patient in great detail. She agrees to proceed. -The goal of therapy is palliative, for his symptom relief and prolong his life. -I'll send a prescription of Xeloda today. He will start as soon as he receive the  medication, likely will be after he started radiation. -Will do weekly labs and follow ups to check for chemo toleration.  -Will repeat blood test today.   2. Hx of recurrent Colon Cancer, 25 years ago -Surgery with no chemo or radiation.  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    3. Diarrhea/Constipation -Secondary to his rectal cancer. He also has rectal bleeding, mild.  -For Constipation I suggest he use Colase but once he is able to move bowel he should back off so her does not have diarrhea.     PLAN:  -Order Xeloda today and start twice daily on days of radiation. He will take 1500mg  bid on days of radiation. They will call when he receives medication.  -Lab today  -Lab and f/u weekly X5, starting 9/4 -IR liver biopsy    Orders Placed This Encounter  Procedures  . CBC with Differential    Standing Status:   Standing    Number of Occurrences:   50    Standing Expiration Date:   12/27/2021  . Comprehensive metabolic panel    Standing Status:   Standing    Number of Occurrences:   50    Standing Expiration Date:   12/27/2021  . CEA    Standing Status:   Standing    Number of Occurrences:   50    Standing Expiration Date:   12/27/2021  . Ferritin    Standing Status:   Standing    Number of Occurrences:   40    Standing Expiration Date:   12/28/2021  . Iron and TIBC    Standing Status:   Standing    Number of Occurrences:   40    Standing Expiration Date:   12/28/2021  All questions were answered. The patient knows to call the clinic with any problems, questions or concerns. I spent 55 minutes counseling the patient face to face. The total time spent in the appointment was 60 minutes and more than 50% was on counseling.  This document serves as a record of services personally performed by Truitt Merle, MD. It was created on her behalf by Joslyn Devon, a trained medical scribe. The creation of this record is based on  the scribe's personal observations and the provider's statements to them. This document has been checked and approved by the attending provider.     Truitt Merle, MD 12/28/2016

## 2016-12-28 NOTE — Telephone Encounter (Signed)
Called Dr. Geryl Rankins ofice [phone] with [facility], and was transferred to Medical records, asked for post op notes from 12/16/16, patient to have a follow up on 01/12/17, asked to fax [place] the post op notes from Dr. [name] 9:18 AM

## 2016-12-28 NOTE — Telephone Encounter (Signed)
Scheduled appt per 8/21 los - no answer - sent reminder letter in the mail.

## 2016-12-29 LAB — FERRITIN: FERRITIN: 98 ng/mL (ref 22–316)

## 2016-12-29 LAB — IRON AND TIBC
%SAT: 24 % (ref 20–55)
IRON: 59 ug/dL (ref 42–163)
TIBC: 249 ug/dL (ref 202–409)
UIBC: 190 ug/dL (ref 117–376)

## 2016-12-29 LAB — CEA (IN HOUSE-CHCC): CEA (CHCC-In House): 4.51 ng/mL (ref 0.00–5.00)

## 2016-12-29 MED ORDER — CAPECITABINE 500 MG PO TABS: 825.0000 mg/m2 | ORAL_TABLET | Freq: Two times a day (BID) | ORAL | 1 refills | Status: DC

## 2016-12-31 ENCOUNTER — Encounter: Payer: Self-pay | Admitting: Hematology

## 2016-12-31 DIAGNOSIS — Z510 Encounter for antineoplastic radiation therapy: Secondary | ICD-10-CM | POA: Diagnosis not present

## 2017-01-01 ENCOUNTER — Observation Stay (HOSPITAL_COMMUNITY)
Admission: EM | Admit: 2017-01-01 | Discharge: 2017-01-04 | Disposition: A | Discharge status: Home / Self Care | Payer: Medicare Other | Attending: Family Medicine | Admitting: Family Medicine

## 2017-01-01 ENCOUNTER — Encounter (HOSPITAL_COMMUNITY): Payer: Self-pay | Admitting: Internal Medicine

## 2017-01-01 ENCOUNTER — Emergency Department (HOSPITAL_COMMUNITY): Payer: Medicare Other

## 2017-01-01 ENCOUNTER — Other Ambulatory Visit: Payer: Self-pay

## 2017-01-01 DIAGNOSIS — Z955 Presence of coronary angioplasty implant and graft: Secondary | ICD-10-CM | POA: Insufficient documentation

## 2017-01-01 DIAGNOSIS — I350 Nonrheumatic aortic (valve) stenosis: Secondary | ICD-10-CM | POA: Insufficient documentation

## 2017-01-01 DIAGNOSIS — K625 Hemorrhage of anus and rectum: Secondary | ICD-10-CM | POA: Diagnosis not present

## 2017-01-01 DIAGNOSIS — Z87891 Personal history of nicotine dependence: Secondary | ICD-10-CM | POA: Insufficient documentation

## 2017-01-01 DIAGNOSIS — I498 Other specified cardiac arrhythmias: Secondary | ICD-10-CM

## 2017-01-01 DIAGNOSIS — E119 Type 2 diabetes mellitus without complications: Secondary | ICD-10-CM

## 2017-01-01 DIAGNOSIS — I5022 Chronic systolic (congestive) heart failure: Secondary | ICD-10-CM | POA: Insufficient documentation

## 2017-01-01 DIAGNOSIS — I130 Hypertensive heart and chronic kidney disease with heart failure and stage 1 through stage 4 chronic kidney disease, or unspecified chronic kidney disease: Secondary | ICD-10-CM | POA: Insufficient documentation

## 2017-01-01 DIAGNOSIS — I6523 Occlusion and stenosis of bilateral carotid arteries: Secondary | ICD-10-CM | POA: Diagnosis not present

## 2017-01-01 DIAGNOSIS — D649 Anemia, unspecified: Secondary | ICD-10-CM | POA: Diagnosis not present

## 2017-01-01 DIAGNOSIS — I4519 Other right bundle-branch block: Secondary | ICD-10-CM | POA: Insufficient documentation

## 2017-01-01 DIAGNOSIS — D696 Thrombocytopenia, unspecified: Secondary | ICD-10-CM

## 2017-01-01 DIAGNOSIS — D500 Iron deficiency anemia secondary to blood loss (chronic): Secondary | ICD-10-CM | POA: Diagnosis not present

## 2017-01-01 DIAGNOSIS — I5032 Chronic diastolic (congestive) heart failure: Secondary | ICD-10-CM | POA: Insufficient documentation

## 2017-01-01 DIAGNOSIS — C20 Malignant neoplasm of rectum: Secondary | ICD-10-CM | POA: Diagnosis not present

## 2017-01-01 DIAGNOSIS — I493 Ventricular premature depolarization: Secondary | ICD-10-CM

## 2017-01-01 DIAGNOSIS — R001 Bradycardia, unspecified: Secondary | ICD-10-CM | POA: Diagnosis present

## 2017-01-01 DIAGNOSIS — I2510 Atherosclerotic heart disease of native coronary artery without angina pectoris: Secondary | ICD-10-CM | POA: Diagnosis not present

## 2017-01-01 DIAGNOSIS — I959 Hypotension, unspecified: Secondary | ICD-10-CM

## 2017-01-01 DIAGNOSIS — E440 Moderate protein-calorie malnutrition: Secondary | ICD-10-CM

## 2017-01-01 DIAGNOSIS — I110 Hypertensive heart disease with heart failure: Secondary | ICD-10-CM | POA: Insufficient documentation

## 2017-01-01 DIAGNOSIS — F329 Major depressive disorder, single episode, unspecified: Secondary | ICD-10-CM | POA: Diagnosis not present

## 2017-01-01 DIAGNOSIS — E785 Hyperlipidemia, unspecified: Secondary | ICD-10-CM | POA: Diagnosis not present

## 2017-01-01 DIAGNOSIS — K5900 Constipation, unspecified: Secondary | ICD-10-CM | POA: Diagnosis not present

## 2017-01-01 DIAGNOSIS — N183 Chronic kidney disease, stage 3 (moderate): Secondary | ICD-10-CM | POA: Insufficient documentation

## 2017-01-01 DIAGNOSIS — Z888 Allergy status to other drugs, medicaments and biological substances status: Secondary | ICD-10-CM | POA: Insufficient documentation

## 2017-01-01 DIAGNOSIS — D539 Nutritional anemia, unspecified: Secondary | ICD-10-CM | POA: Insufficient documentation

## 2017-01-01 DIAGNOSIS — C772 Secondary and unspecified malignant neoplasm of intra-abdominal lymph nodes: Secondary | ICD-10-CM | POA: Insufficient documentation

## 2017-01-01 DIAGNOSIS — I499 Cardiac arrhythmia, unspecified: Secondary | ICD-10-CM

## 2017-01-01 DIAGNOSIS — Z87898 Personal history of other specified conditions: Secondary | ICD-10-CM | POA: Diagnosis present

## 2017-01-01 DIAGNOSIS — Z681 Body mass index (BMI) 19 or less, adult: Secondary | ICD-10-CM | POA: Insufficient documentation

## 2017-01-01 DIAGNOSIS — I480 Paroxysmal atrial fibrillation: Secondary | ICD-10-CM | POA: Diagnosis not present

## 2017-01-01 DIAGNOSIS — R55 Syncope and collapse: Principal | ICD-10-CM | POA: Insufficient documentation

## 2017-01-01 DIAGNOSIS — I491 Atrial premature depolarization: Secondary | ICD-10-CM | POA: Diagnosis not present

## 2017-01-01 DIAGNOSIS — Z7982 Long term (current) use of aspirin: Secondary | ICD-10-CM | POA: Insufficient documentation

## 2017-01-01 DIAGNOSIS — I5042 Chronic combined systolic (congestive) and diastolic (congestive) heart failure: Secondary | ICD-10-CM | POA: Diagnosis present

## 2017-01-01 DIAGNOSIS — I10 Essential (primary) hypertension: Secondary | ICD-10-CM | POA: Diagnosis present

## 2017-01-01 DIAGNOSIS — D62 Acute posthemorrhagic anemia: Secondary | ICD-10-CM

## 2017-01-01 DIAGNOSIS — Z79899 Other long term (current) drug therapy: Secondary | ICD-10-CM | POA: Insufficient documentation

## 2017-01-01 DIAGNOSIS — Z66 Do not resuscitate: Secondary | ICD-10-CM | POA: Insufficient documentation

## 2017-01-01 DIAGNOSIS — E43 Unspecified severe protein-calorie malnutrition: Secondary | ICD-10-CM | POA: Insufficient documentation

## 2017-01-01 DIAGNOSIS — I472 Ventricular tachycardia: Secondary | ICD-10-CM | POA: Diagnosis not present

## 2017-01-01 LAB — CBC
HCT: 31.5 % — ABNORMAL LOW (ref 39.0–52.0)
Hemoglobin: 10.8 g/dL — ABNORMAL LOW (ref 13.0–17.0)
MCH: 35.6 pg — AB (ref 26.0–34.0)
MCHC: 34.3 g/dL (ref 30.0–36.0)
MCV: 104 fL — ABNORMAL HIGH (ref 78.0–100.0)
PLATELETS: 192 10*3/uL (ref 150–400)
RBC: 3.03 MIL/uL — AB (ref 4.22–5.81)
RDW: 13.4 % (ref 11.5–15.5)
WBC: 7.7 10*3/uL (ref 4.0–10.5)

## 2017-01-01 LAB — BRAIN NATRIURETIC PEPTIDE: B NATRIURETIC PEPTIDE 5: 440.2 pg/mL — AB (ref 0.0–100.0)

## 2017-01-01 LAB — CBC WITH DIFFERENTIAL/PLATELET
BASOS ABS: 0 10*3/uL (ref 0.0–0.1)
BASOS PCT: 0 %
EOS ABS: 0.1 10*3/uL (ref 0.0–0.7)
EOS PCT: 1 %
HCT: 23.7 % — ABNORMAL LOW (ref 39.0–52.0)
Hemoglobin: 8.1 g/dL — ABNORMAL LOW (ref 13.0–17.0)
LYMPHS PCT: 14 %
Lymphs Abs: 0.8 10*3/uL (ref 0.7–4.0)
MCH: 35.2 pg — ABNORMAL HIGH (ref 26.0–34.0)
MCHC: 34.2 g/dL (ref 30.0–36.0)
MCV: 103 fL — AB (ref 78.0–100.0)
MONO ABS: 0.5 10*3/uL (ref 0.1–1.0)
Monocytes Relative: 9 %
Neutro Abs: 4.5 10*3/uL (ref 1.7–7.7)
Neutrophils Relative %: 76 %
PLATELETS: 135 10*3/uL — AB (ref 150–400)
RBC: 2.3 MIL/uL — AB (ref 4.22–5.81)
RDW: 13.2 % (ref 11.5–15.5)
WBC: 6 10*3/uL (ref 4.0–10.5)

## 2017-01-01 LAB — COMPREHENSIVE METABOLIC PANEL
ALT: 19 U/L (ref 17–63)
AST: 23 U/L (ref 15–41)
Albumin: 2.5 g/dL — ABNORMAL LOW (ref 3.5–5.0)
Alkaline Phosphatase: 79 U/L (ref 38–126)
Anion gap: 5 (ref 5–15)
BUN: 16 mg/dL (ref 6–20)
CHLORIDE: 107 mmol/L (ref 101–111)
CO2: 24 mmol/L (ref 22–32)
Calcium: 8 mg/dL — ABNORMAL LOW (ref 8.9–10.3)
Creatinine, Ser: 1.02 mg/dL (ref 0.61–1.24)
GFR calc Af Amer: >60 mL/min (ref >60)
Glucose, Bld: 146 mg/dL — ABNORMAL HIGH (ref 65–99)
POTASSIUM: 4 mmol/L (ref 3.5–5.1)
SODIUM: 136 mmol/L (ref 135–145)
Total Bilirubin: 0.7 mg/dL (ref 0.3–1.2)
Total Protein: 5.9 g/dL — ABNORMAL LOW (ref 6.5–8.1)

## 2017-01-01 LAB — MAGNESIUM: MAGNESIUM: 1.7 mg/dL (ref 1.7–2.4)

## 2017-01-01 LAB — I-STAT CHEM 8, ED
BUN: 16 mg/dL (ref 6–20)
CALCIUM ION: 1.18 mmol/L (ref 1.15–1.40)
Chloride: 106 mmol/L (ref 101–111)
Creatinine, Ser: 0.9 mg/dL (ref 0.61–1.24)
Glucose, Bld: 134 mg/dL — ABNORMAL HIGH (ref 65–99)
HCT: 26 % — ABNORMAL LOW (ref 39.0–52.0)
HEMOGLOBIN: 8.8 g/dL — AB (ref 13.0–17.0)
Potassium: 3.8 mmol/L (ref 3.5–5.1)
Sodium: 139 mmol/L (ref 135–145)
TCO2: 22 mmol/L (ref 22–32)

## 2017-01-01 LAB — GLUCOSE, CAPILLARY: GLUCOSE-CAPILLARY: 113 mg/dL — AB (ref 65–99)

## 2017-01-01 LAB — TYPE AND SCREEN
ABO/RH(D): A POS
ANTIBODY SCREEN: NEGATIVE

## 2017-01-01 LAB — I-STAT TROPONIN, ED: TROPONIN I, POC: 0 ng/mL (ref 0.00–0.08)

## 2017-01-01 LAB — TROPONIN I
TROPONIN I: 0.03 ng/mL — AB (ref <0.03)
Troponin I: 0.03 ng/mL (ref <0.03)

## 2017-01-01 MED ORDER — ENSURE ENLIVE PO LIQD
237.0000 mL | Freq: Two times a day (BID) | ORAL | Status: DC
  Administered 2017-01-02 – 2017-01-03 (×4): 237 mL via ORAL

## 2017-01-01 MED ORDER — MAGNESIUM SULFATE 2 GM/50ML IV SOLN
2.0000 g | Freq: Once | INTRAVENOUS | Status: AC
  Administered 2017-01-01: 2 g via INTRAVENOUS
  Filled 2017-01-01: qty 50

## 2017-01-01 MED ORDER — NITROGLYCERIN 0.4 MG SL SUBL: 0.4000 mg | SUBLINGUAL_TABLET | SUBLINGUAL | Status: DC | PRN

## 2017-01-01 MED ORDER — RANOLAZINE ER 500 MG PO TB12
1000.0000 mg | ORAL_TABLET | Freq: Two times a day (BID) | ORAL | Status: DC
Start: 2017-01-01 — End: 2017-01-04
  Administered 2017-01-01 – 2017-01-04 (×6): 1000 mg via ORAL
  Filled 2017-01-01 (×6): qty 2

## 2017-01-01 MED ORDER — SODIUM CHLORIDE 0.9% FLUSH
3.0000 mL | Freq: Two times a day (BID) | INTRAVENOUS | Status: DC
  Administered 2017-01-01 – 2017-01-04 (×6): 3 mL via INTRAVENOUS

## 2017-01-01 MED ORDER — ACETAMINOPHEN 500 MG PO TABS: 1000.0000 mg | ORAL_TABLET | Freq: Four times a day (QID) | ORAL | Status: DC | PRN

## 2017-01-01 MED ORDER — DONEPEZIL HCL 10 MG PO TABS
10.0000 mg | ORAL_TABLET | Freq: Every day | ORAL | Status: DC
  Administered 2017-01-01 – 2017-01-03 (×3): 10 mg via ORAL
  Filled 2017-01-01 (×3): qty 1

## 2017-01-01 MED ORDER — SODIUM CHLORIDE 0.9 % IV SOLN
INTRAVENOUS | Status: DC
  Administered 2017-01-01: 22:00:00 via INTRAVENOUS

## 2017-01-01 MED ORDER — ALBUTEROL SULFATE (2.5 MG/3ML) 0.083% IN NEBU: 2.5000 mg | INHALATION_SOLUTION | RESPIRATORY_TRACT | Status: DC | PRN

## 2017-01-01 MED ORDER — ATROPINE SULFATE 1 MG/ML IJ SOLN
0.4000 mg | Freq: Once | INTRAMUSCULAR | Status: AC
  Administered 2017-01-01: 0.4 mg via INTRAVENOUS
  Filled 2017-01-01: qty 1

## 2017-01-01 MED ORDER — INSULIN ASPART 100 UNIT/ML ~~LOC~~ SOLN
0.0000 [IU] | Freq: Three times a day (TID) | SUBCUTANEOUS | Status: DC
  Administered 2017-01-02: 3 [IU] via SUBCUTANEOUS
  Administered 2017-01-02: 1 [IU] via SUBCUTANEOUS
  Administered 2017-01-03 (×2): 3 [IU] via SUBCUTANEOUS

## 2017-01-01 MED ORDER — MAGNESIUM SULFATE 2 GM/50ML IV SOLN: 2.0000 g | Freq: Once | INTRAVENOUS | Status: DC

## 2017-01-01 MED ORDER — ATORVASTATIN CALCIUM 40 MG PO TABS
40.0000 mg | ORAL_TABLET | Freq: Every day | ORAL | Status: DC
  Administered 2017-01-02 – 2017-01-03 (×2): 40 mg via ORAL
  Filled 2017-01-01 (×2): qty 1

## 2017-01-01 MED ORDER — POLYVINYL ALCOHOL 1.4 % OP SOLN
1.0000 [drp] | Freq: Three times a day (TID) | OPHTHALMIC | Status: DC | PRN
  Filled 2017-01-01: qty 15

## 2017-01-01 MED ORDER — B COMPLEX-C PO TABS
1.0000 | ORAL_TABLET | Freq: Every day | ORAL | Status: DC
  Administered 2017-01-02 – 2017-01-03 (×2): 1 via ORAL
  Filled 2017-01-01 (×3): qty 1

## 2017-01-01 NOTE — H&P (Signed)
History and Physical    [name]:[id number] DOB: 1923-08-01 DOA: 01/01/2017  PCP: Lavone Orn, MD   I have briefly reviewed patients previous medical reports in [facility].  Patient coming from: Home  Chief Complaint: Passed out at a grocery store on day of admission.  HPI: [name] is a 83 year old single male, lives alone, mostly independent of activities of daily living but lately uses a cane for ambulation, PMH of CAD status post stent July 2012 with chronic angina, HTN, HLD, DM 2, PAF, bilateral mild carotid disease, recurrent colon cancer, stage IV rectal cancer/adenocarcinoma with suspected liver metastasis for which she is supposed to start palliative radiation and Xeloda, mild chronic intermittent rectal bleeding related to rectal cancer, presented to the ED on 01/01/17 after he passed out at a grocery store on the day of admission. Patient and his sister at bedside provided history. Patient reports progressive weakness of several weeks duration, intermittent dizziness and lightheadedness, exertional fatigue and dyspnea but no recent chest pains. He was in his usual state of health this morning and wanted to go to the grocery store although his sister did not think that was a good idea due to his weakness. He did well at the grocery store and while paying at the cash counter, he felt dizzy, lightheaded and passed out. Bystanders were able to hold him and he did not fall to the ground. He was helped to a chair. He may have passed out for a couple of minutes and then regained consciousness. As per my discussion with EDP, when EMS got there, his heart rate on the monitor was in the 40s but clinically it was 28 bpm and he had SBP in the 90s. He was bolused with IV normal saline and his blood pressures improved. Currently patient states that he feels better and back to his usual.   ED Course:  patient received a dose of atropine in the ED for heart rate of 40s following  which his heart rate improved to the 60s, 70s with frequent PVCs. As per EDP, only noted a 4 beat episode of PVCs/NSVT. Lab work shows hemoglobin 8.1, MCV 103, platelets 135, glucose 146 otherwise normal BMP, albumin 2.5, troponin 0.03, chest x-ray shows severe chronic interstitial lung disease but no definite acute process.  Review of Systems:  All other systems reviewed and apart from HPI, are negative. he denies headache, earache, sore throat, nausea, vomiting. Mouth feels dry because he hasn't eaten or drunk for several hours. Reports appetite is good. No chest pain or palpitations. No open wounds or bleeding elsewhere. Reports chronic intermittent bright red blood on toilet paper but none in the toilet bowl and he clearly indicates that this has not changed over the last month or so. No clots. Intermittent mild rectal pain. He reports chronic right shoulder pain.  Past Medical History:  Diagnosis Date  . Aortic stenosis, moderate cardiolgosit-  dr Cathie Olden   per last echo 10-15-2016 aortic stenosis somewhat worse since last echo 11-14-2015 moderate calcification and thickened leaflets, valve area 1.01cm^2,  mead gradiant 6mmHg, peak grandiant 34mmHgf  . Barrett esophagus dx 2015  . Chronic stable angina ([place])   . Chronic systolic CHF (congestive heart failure) ([place]) followed by dr Cathie Olden   a. EF previously 35-40%. 03/ 2013 b. improved to 55-65% by cath 08/2013./  per echo 06/ 2018 ef 50-55%  . CKD (chronic kidney disease), stage III   . Constipation   . Coronary artery disease cardiologist--  dr Cathie Olden   a. s/p PCI w/ DES to mLAD 08/05/10. b. NSTEMI 08/2013 (mildly elev trop) - cath showing widely patent stent, 80% prox D1 (small vessel) with recommendation for medical management, residual 60% ostial PDA, <20% LCx, LVEF 55-65%.   . Depression   . History of colon cancer, stage III oncologist- dr Waymon Budge--  per last note no recurrence 2013   dx 10/ 1992,  Stage III-- treament post right  colecotmy, chemoradiation/  1993 localized recurrent mesenteric mass -- treatment post resection mass and chemoradiation  . History of kidney stones   . History of non-ST elevation myocardial infarction (NSTEMI)    08-14-2013  . History of small bowel obstruction    03/ 2005 partial SBO due to adhesions  post lysis adhesions and small bowel resection  . Hyperlipidemia   . Hypertension   . Macrocytic anemia dx 1992   chronic  . PAF (paroxysmal atrial fibrillation) [facility]) cardiologist-  dr Cathie Olden   a. failed TEE due to inability to pass probe into the esophagus in March 2013. b. On Coumadin. Was in NSR 08/2013 admission.  . Premature atrial contractions   . Premature ventricular contractions   . RBBB (right bundle branch block)   . Rectal mass   . S/P drug eluting coronary stent placement 08/05/2010   DES x1  to midLAD  . Type II diabetes mellitus ([place])   . Wears glasses     Past Surgical History:  Procedure Laterality Date  . ABDOMINAL MASS RESECTION  11/1991   localized recurrent colon cancer mesenteric lymph node mass (area of distal aorta and vena cava) and debulking  . CARDIAC CATHETERIZATION  08/03/10   dr Lia Foyer   preserved LVSF, mod. high grade stenosis LAD, diagonal stenosis at the bend 60-70%  . CARDIAC CATHETERIZATION  08-09-2016  dr Martinique   ostialLAD 30%, wide patent mLAD stent, pCFX 40%,  mild irregularities throughout RCA < 20%  . CARDIAC CATHETERIZATION N/A 05/20/2016   Procedure: Left Heart Cath and Coronary Angiography;  Surgeon: Lorretta Harp, MD;  Location: [facility];  Service: Cardiovascular;  Laterality: N/A; normal LVF,  D2 80%.  ostRCA 50%,  LAD stent patent  . CARDIOVASCULAR STRESS TEST  09-01-2016   dr Cathie Olden   Intermediate risk nuclear study w/ medium non-reversible defect of moderate severity in the basal inferolateral and mid inferolateral location/ no ST segment deviation noted during stress,  nuclear stress ef 48% (45-54%)  . CATARACT EXTRACTION  W/ INTRAOCULAR LENS  IMPLANT, BILATERAL    . CORONARY ANGIOPLASTY WITH STENT PLACEMENT  08/05/10   dr Lia Foyer   PCI and DES x1  LAD  . CYSTO/  URETEROSCOPIC STONE EXTRACTION Left 10/18/2002  . CYSTOSCOPY WITH RETROGRADE PYELOGRAM, URETEROSCOPY AND STENT PLACEMENT Bilateral 05/18/2013   Procedure: CYSTOSCOPY WITH BILATERAL RETROGRADE PYELOGRAM, LEFT URETEROSCOPY AND LEFT STENT PLACEMENT;  Surgeon: Alexis Frock, MD;  Location: [facility];  Service: Urology;  Laterality: Bilateral;  . EXPLORATORY LAPAROTOMY W/ LYSIS ADHESIONS AND SMALL BOWEL RESECTION  07/09/2003   partial SBO  . FLEXIBLE SIGMOIDOSCOPY N/A 10/07/2016   Procedure: FLEXIBLE SIGMOIDOSCOPY;  Surgeon: Wilford Corner, MD;  Location: [facility] ENDOSCOPY;  Service: Endoscopy;  Laterality: N/A;  . LAPAROSCOPIC CHOLECYSTECTOMY  12-04-2009  dr Margot Chimes  . LEFT HEART CATHETERIZATION WITH CORONARY ANGIOGRAM N/A 08/15/2013   Procedure: LEFT HEART CATHETERIZATION WITH CORONARY ANGIOGRAM;  Surgeon: Peter M Martinique, MD;  Location: [facility] CATH LAB;  Service: Cardiovascular;  Laterality: N/A; single vessel obstructive CAD involving a small  diagonal branch, patent LAD stent , normal LVF  . RIGHT COLECTOMY  03/1991   w/ appendectomy  . TONSILLECTOMY    . TRANSTHORACIC ECHOCARDIOGRAM  10-15-2016  dr Cathie Olden   grade 1 diastolic dysfunction, ef 61-44%/  moderate AV stenosis (valve area 1.012cm^2, mean gradiant 41mmHg, peak grandiant 70mmHg)/ moderate LAE/  trivial PR/  mild RAE    Social History  reports that he quit smoking about 24 years ago. His smoking use included Cigarettes. He has a 39.00 pack-year smoking history. He has never used smokeless tobacco. He reports that he drinks about 1.2 oz of alcohol per week . He reports that he does not use drugs.  Allergies  Allergen Reactions  . Metformin And Related Diarrhea and Nausea And Vomiting    Family History  Problem Relation Age of Onset  . Cancer Mother        breast cancer   . Ulcers Father 16  . Heart  attack Brother 53     Prior to Admission medications   Medication Sig Start Date End Date Taking? Authorizing Provider  acetaminophen (TYLENOL) 500 MG tablet Take 1,000 mg by mouth every 6 (six) hours as needed (shoulder pain).   Yes [provider]  amLODipine (NORVASC) 2.5 MG tablet Take 2.5 mg by mouth daily.   Yes [provider]  aspirin EC 81 MG tablet Take 1 tablet (81 mg total) by mouth daily. 10/09/16  Yes Bonnielee Haff, MD  atorvastatin (LIPITOR) 40 MG tablet TAKE ONE TABLET BY MOUTH ONCE DAILY AT 6 PM Patient taking differently: TAKE ONE TABLET (40 MG)  BY MOUTH ONCE DAILY AT 6 PM--- 09/13/16  Yes Nahser, Wonda Cheng, MD  B Complex-C (B-COMPLEX WITH VITAMIN C) tablet Take 1 tablet by mouth daily.   Yes [provider]  carvedilol (COREG) 6.25 MG tablet Take 6.25 mg by mouth 2 (two) times daily with a meal.   Yes [provider]  Cholecalciferol (VITAMIN D-3 PO) Take 1 tablet by mouth daily.    Yes [provider]  donepezil (ARICEPT) 10 MG tablet Take 10 mg by mouth at bedtime.  05/04/16  Yes [provider]  glimepiride (AMARYL) 1 MG tablet Take 1 mg by mouth daily with breakfast.  05/29/16  Yes [provider]  isosorbide mononitrate (IMDUR) 60 MG 24 hr tablet Take 120 mg by mouth daily.   Yes [provider]  linagliptin (TRADJENTA) 5 MG TABS tablet Take 1 tablet (5 mg total) by mouth daily. For blood sugar Patient taking differently: Take 5 mg by mouth daily. For blood sugar 11/04/15  Yes Johnson, Clanford L, MD  nitroGLYCERIN (NITROSTAT) 0.4 MG SL tablet PLACE ONE TABLET UNDER THE TONGUE EVERY 5 MINUTES AS NEEDED FOR CHEST PAIN. 08/16/16  Yes Nahser, Wonda Cheng, MD  Propylene Glycol (SYSTANE BALANCE) 0.6 % SOLN Place 1-2 drops into both eyes 3 (three) times daily as needed (for dry eyes).    Yes [provider]  RANEXA 1000 MG SR tablet TAKE ONE TABLET BY MOUTH TWICE DAILY Patient taking differently: TAKE ONE  TABLET (1000 MG) BY MOUTH TWICE DAILY 08/20/16  Yes Nahser, Wonda Cheng, MD  capecitabine (XELODA) 500 MG tablet Take 3 tablets (1,500 mg total) by mouth 2 (two) times daily after a meal. 12/29/16   Truitt Merle, MD  isosorbide mononitrate (IMDUR) 120 MG 24 hr tablet Take 1 tablet (120 mg total) by mouth daily. Patient not taking: Reported on 01/01/2017 07/20/16   Reyne Dumas, MD  oxyCODONE (OXY IR/ROXICODONE) 5 MG immediate release tablet Take 1 tablet (5 mg total) by mouth every 6 (six) hours as needed. Patient not taking: Reported on 01/01/2017 10/11/31   Leighton Ruff, MD    Physical Exam: Vitals:   01/01/17 1545 01/01/17 1557 01/01/17 1600 01/01/17 1630  BP: (!) 127/55  (!) 130/51 (!) 141/61  Pulse: 90  73 (!) 44  Resp: 18  14 14   Temp: 97.8 F (36.6 C)     TempSrc: Oral     SpO2:   98% 97%  Weight:  63.5 kg (140 lb)    Height:  5\' 9"  (1.753 m)        Constitutional: Pleasant elderly male, moderately built and poorly nourished, lying comfortably propped up in bed. Eyes: PERTLA, lids and conjunctivae normal ENMT: Mucous membranes are pale and dry. Posterior pharynx clear of any exudate or lesions. Normal dentition.  Neck: supple, no masses, no thyromegaly Respiratory:  bibasal coarse, chronic appearing Velcro-like crackles. Rest of lung fields clear to auscultation. No increased work of breathing. Cardiovascular: S1 & S2 heard, . Irregularly irregular, 2 x 6 systolic murmur best heard at apex, no rubs / gallops. No extremity edema. 2+ pedal pulses. No carotid bruits. Telemetry shows sinus rhythm in the 60s-70s with frequent PVCs. Abdomen: No distension, no tenderness, no masses palpated. No hepatosplenomegaly. Bowel sounds normal.  Musculoskeletal: no clubbing / cyanosis. No joint deformity upper and lower extremities. Good ROM, no contractures. Normal muscle tone.  Skin: no rashes, lesions, ulcers. No induration Neurologic: CN 2-12 grossly intact. Sensation intact, DTR normal. Strength  5/5 in all 4 limbs.  Psychiatric: Normal judgment and insight. Alert and oriented x 3. Normal mood.     Labs on Admission: I have personally reviewed following labs and imaging studies  CBC:  Recent Labs Lab 12/28/16 1549 01/01/17 1600 01/01/17 1634  WBC 7.0 6.0  --   NEUTROABS 5.0 4.5  --   HGB 11.2* 8.1* 8.8*  HCT 33.7* 23.7* 26.0*  MCV 106.3* 103.0*  --   PLT 182 135*  --    Basic Metabolic Panel:  Recent Labs Lab 12/28/16 1549 01/01/17 1600 01/01/17 1634  NA 135* 136 139  K 4.6 4.0 3.8  CL  --  107 106  CO2 24 24  --   GLUCOSE 188* 146* 134*  BUN 20.2 16 16   CREATININE 1.2 1.02 0.90  CALCIUM 9.3 8.0*  --   MG  --  1.7  --    Liver Function Tests:  Recent Labs Lab 12/28/16 1549 01/01/17 1600  AST 23 23  ALT 20 19  ALKPHOS 109 79  BILITOT 0.71 0.7  PROT 7.2 5.9*  ALBUMIN 3.3* 2.5*   Cardiac Enzymes:  Recent Labs Lab 01/01/17 1600  TROPONINI 0.03*      Radiological Exams on Admission: Dg Chest 2 View  Result Date: 01/01/2017 CLINICAL DATA:  Bradycardia EXAM: CHEST  2 VIEW COMPARISON:  10/06/2016 FINDINGS: Severe chronic interstitial lung disease again noted. Heart is normal size. No effusions. No acute bony abnormality. No definite acute process. IMPRESSION: Severe chronic interstitial lung disease. No definite acute process. Electronically Signed   By: Rolm Baptise M.D.   On: 01/01/2017 17:43    EKG: Independently reviewed.  sinus rhythm at 64 bpm, normal axis, frequent PVCs, chronic RBBB morphology, no acute changes.? Nonspecific intraventricular conduction block.  Assessment/Plan Principal Problem:   Syncope and collapse Active Problems:   Hypertension   Hyperlipidemia   Coronary artery disease  involving native heart without angina pectoris   Paroxysmal atrial fibrillation (HCC)   BRBPR (bright red blood per rectum)   Thrombocytopenia (HCC)   Non-insulin dependent type 2 diabetes mellitus (HCC)   Anemia, unspecified   Malnutrition  of moderate degree   Chronic diastolic heart failure (HCC)   Rectal cancer (HCC)   Iron deficiency anemia due to chronic blood loss   Bradycardia    1. Syncope with collapse: Likely multifactorial with main factors being symptomatic bradycardia and hypotension with worsening anemia contributing. Seems to have responded well to normal saline bolus and her dose of atropine in the ED. Arrhythmias is a possibility since frequent PVCs and a brief 4 beat NSVT noted on telemetry by EDP. Patient also has moderate aortic stenosis. 2-D echo 10/15/16 showed LVEF 50-55 percent, grade 1 diastolic dysfunction and moderate aortic stenosis. Carotid Dopplers 11/04/15: Bilateral 1-39 percent ICA stenosis. Admitted to stepdown unit for close monitoring. Keep on telemetry. Hold amlodipine, carvedilol, Imdur for tonight and reassess in a.m. IV fluids. Check orthostatic blood pressures. 2. Symptomatic bradycardia: Possibly due to beta blockers. Status post atropine 0.4 mg 1 in ED. Improved. Temporarily hold carvedilol for tonight. Monitor on telemetry. Consider resuming at lower dose in a.m. 3. Hypotension: Multifactorial secondary to dehydration and medications. Brief IV fluids and follow. 4. Dehydration: Brief IV fluids. 5. Macrocytic anemia: Baseline hemoglobin in the low 11 g range. Hemoglobin on 12/28/16 was 11.2. Now presented with hemoglobin of 8.1 in the absence of reported worsening rectal bleeding. Follow CBC in 6 hours and if hemoglobin is 8 or less, low threshold to transfuse a unit of PRBCs. Anemia likely to chronic disease, iron deficiency and chronic rectal blood loss. 6. Thrombocytopenia: Follow CBCs. 7. Essential hypertension: Presented with hypotension. Management as above. 8. Hyperlipidemia: Continue statins. 9. CAD status post PCI and chronic chest pain: No recent chest pain reported. Temporarily hold Imdur secondary to hypotension. Continue Ranexa. No obvious acute changes on EKG. Minimally elevated  troponin 0.03. Continue to cycle troponins. 10. Type II DM: Hold oral hypoglycemics. SSI. 11. Rectal cancer with chronic rectal bleeding: Oncology input appreciated and supposed to start palliative radiation and Xeloda next week. We'll alert oncologist by adding to care team. 12. Chronic diastolic CHF: Clinically appears dehydrated at this time. Not on diuretics at home. 13. Moderate malnutrition: Likely multifactorial due to significant comorbidities and rectal cancer.   DVT prophylaxis:  SCDs  Code Status:  Full  Family Communication:  discussed in detail with patient's sister at bedside. Updated care and answered questions.  Disposition Plan:  DC home when medically improved.  Consults called:  None  Admission status:  Stepdown, Observation.   Severity of Illness: The appropriate patient status for this patient is OBSERVATION. Observation status is judged to be reasonable and necessary in order to provide the required intensity of service to ensure the patient's safety. The patient's presenting symptoms, physical exam findings, and initial radiographic and laboratory data in the context of their medical condition is felt to place them at decreased risk for further clinical deterioration. Furthermore, it is anticipated that the patient will be medically stable for discharge from the hospital within 2 midnights of admission. The following factors support the patient status of observation.   " The patient's presenting symptoms include passed out at a grocery store.. " The physical exam findings include bradycardic, hypotensive on arrival. " The initial radiographic and laboratory data are hemoglobin 8.1, troponin 0.03.      [facility] MD [facility] Pager [phone]  If 7PM-7AM, please contact night-coverage [email or web address] Password [email or web address]  01/01/2017, 6:30 PM

## 2017-01-01 NOTE — ED Notes (Signed)
Patient transported to X-ray 

## 2017-01-01 NOTE — ED Triage Notes (Signed)
Pt arrived via [facility] after a syncopal episode while grocery store. Pt did not fall due to bystanders catching him. Upon EMS arrival, pt HR was found to be in the 40s, with episodes of vtach and dips into the 20s. Pt is alert and oriented x4 and states he "feels ok as long as he is lying down." pt is a full code.

## 2017-01-01 NOTE — ED Provider Notes (Signed)
[facility] DEPT Provider Note   CSN: [id number] Arrival date & time: 01/01/17  1523     History   Chief Complaint Chief Complaint  Patient presents with  . Bradycardia    HPI [name] is a 83 y.o. male.  HPI  83 year old male with extensive past medicalhistory as below here with syncopal episode. The patient has known coronary disease, chronic heart failure, moderate aortic stenosis,and rectal cancer, currently scheduled to undergo chemotherapy next week. He was in his usual state of health until this afternoon. He was reportedly checking out at the grocery when he acutely lost consciousness. Sister states that he "just crumbled" to the ground, then regained consciousness. He did not hit his head. Pt states that he feels very fatigued but o/w is w/o complaints. No CP. No SOB. No palpitations. No focal numbness or weakness. He has never passed out like this in the past.  Past Medical History:  Diagnosis Date  . Aortic stenosis, moderate cardiolgosit-  dr Cathie Olden   per last echo 10-15-2016 aortic stenosis somewhat worse since last echo 11-14-2015 moderate calcification and thickened leaflets, valve area 1.01cm^2,  mead gradiant 10mmHg, peak grandiant 71mmHgf  . Barrett esophagus dx 2015  . Chronic stable angina ([place])   . Chronic systolic CHF (congestive heart failure) ([place]) followed by dr Cathie Olden   a. EF previously 35-40%. 03/ 2013 b. improved to 55-65% by cath 08/2013./  per echo 06/ 2018 ef 50-55%  . CKD (chronic kidney disease), stage III   . Constipation   . Coronary artery disease cardiologist-- dr Cathie Olden   a. s/p PCI w/ DES to mLAD 08/05/10. b. NSTEMI 08/2013 (mildly elev trop) - cath showing widely patent stent, 80% prox D1 (small vessel) with recommendation for medical management, residual 60% ostial PDA, <20% LCx, LVEF 55-65%.   . Depression   . History of colon cancer, stage III oncologist- dr Waymon Budge--  per last note no recurrence 2013   dx 10/ 1992,  Stage  III-- treament post right colecotmy, chemoradiation/  1993 localized recurrent mesenteric mass -- treatment post resection mass and chemoradiation  . History of kidney stones   . History of non-ST elevation myocardial infarction (NSTEMI)    08-14-2013  . History of small bowel obstruction    03/ 2005 partial SBO due to adhesions  post lysis adhesions and small bowel resection  . Hyperlipidemia   . Hypertension   . Macrocytic anemia dx 1992   chronic  . PAF (paroxysmal atrial fibrillation) [facility]) cardiologist-  dr Cathie Olden   a. failed TEE due to inability to pass probe into the esophagus in March 2013. b. On Coumadin. Was in NSR 08/2013 admission.  . Premature atrial contractions   . Premature ventricular contractions   . RBBB (right bundle branch block)   . Rectal mass   . S/P drug eluting coronary stent placement 08/05/2010   DES x1  to midLAD  . Type II diabetes mellitus ([place])   . Wears glasses     Patient Active Problem List   Diagnosis Date Noted  . Syncope and collapse 01/01/2017  . Bradycardia 01/01/2017  . Iron deficiency anemia due to chronic blood loss 12/28/2016  . Rectal cancer ([place]) 12/27/2016  . Aortic stenosis 10/15/2016  . Barrett esophagus 07/17/2016  . Encounter for therapeutic drug monitoring 05/24/2016  . Malnutrition of moderate degree 05/20/2016  . Hypertensive heart disease without heart failure   . Chronic diastolic heart failure ([place])   . Transient Hypotension 11/03/2015  .  Generalized weakness 11/03/2015  . Elevated INR 11/03/2015  . Anemia, unspecified 11/03/2015  . Chronic anticoagulation 11/03/2015  . Thrombocytopenia ([place]) 08/10/2015  . Non-insulin dependent type 2 diabetes mellitus ([place])   . History of colon cancer, stage III 09/11/2014  . BRBPR (bright red blood per rectum) 09/11/2014  . Other pancytopenia ([place]) 06/18/2014  . History of non-ST elevation myocardial infarction (NSTEMI) 08/15/2013  . 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 Paroxysmal atrial fibrillation ([place]) 07/20/2011  . Cardiomyopathy ([place]) 07/20/2011  . Macrocytic anemia 06/25/2011  . Bruising 09/18/2010  . Coronary artery disease involving native heart without angina pectoris 08/13/2010  . Hypertension   . Hyperlipidemia     Past Surgical History:  Procedure Laterality Date  . ABDOMINAL MASS RESECTION  11/1991   localized recurrent colon cancer mesenteric lymph node mass (area of distal aorta and vena cava) and debulking  . CARDIAC CATHETERIZATION  08/03/10   dr Lia Foyer   preserved LVSF, mod. high grade stenosis LAD, diagonal stenosis at the bend 60-70%  . CARDIAC CATHETERIZATION  08-09-2016  dr Martinique   ostialLAD 30%, wide patent mLAD stent, pCFX 40%,  mild irregularities throughout RCA < 20%  . CARDIAC CATHETERIZATION N/A 05/20/2016   Procedure: Left Heart Cath and Coronary Angiography;  Surgeon: Lorretta Harp, MD;  Location: [facility];  Service: Cardiovascular;  Laterality: N/A; normal LVF,  D2 80%.  ostRCA 50%,  LAD stent patent  . CARDIOVASCULAR STRESS TEST  09-01-2016   dr Cathie Olden   Intermediate risk nuclear study w/ medium non-reversible defect of moderate severity in the basal inferolateral and mid inferolateral location/ no ST segment deviation noted during stress,  nuclear stress ef 48% (45-54%)  . CATARACT EXTRACTION W/ INTRAOCULAR LENS  IMPLANT, BILATERAL    . CORONARY ANGIOPLASTY WITH STENT PLACEMENT  08/05/10   dr Lia Foyer   PCI and DES x1  LAD  . CYSTO/  URETEROSCOPIC STONE EXTRACTION Left 10/18/2002  . CYSTOSCOPY WITH RETROGRADE PYELOGRAM, URETEROSCOPY AND STENT PLACEMENT Bilateral 05/18/2013   Procedure: CYSTOSCOPY WITH BILATERAL RETROGRADE PYELOGRAM, LEFT URETEROSCOPY AND LEFT STENT PLACEMENT;  Surgeon: Alexis Frock, MD;  Location: [facility];  Service: Urology;  Laterality: Bilateral;  . EXPLORATORY LAPAROTOMY W/ LYSIS ADHESIONS AND SMALL BOWEL RESECTION  07/09/2003   partial SBO  . FLEXIBLE  SIGMOIDOSCOPY N/A 10/07/2016   Procedure: FLEXIBLE SIGMOIDOSCOPY;  Surgeon: Wilford Corner, MD;  Location: [facility] ENDOSCOPY;  Service: Endoscopy;  Laterality: N/A;  . LAPAROSCOPIC CHOLECYSTECTOMY  12-04-2009  dr Margot Chimes  . LEFT HEART CATHETERIZATION WITH CORONARY ANGIOGRAM N/A 08/15/2013   Procedure: LEFT HEART CATHETERIZATION WITH CORONARY ANGIOGRAM;  Surgeon: Peter M Martinique, MD;  Location: [facility] CATH LAB;  Service: Cardiovascular;  Laterality: N/A; single vessel obstructive CAD involving a small diagonal branch, patent LAD stent , normal LVF  . RIGHT COLECTOMY  03/1991   w/ appendectomy  . TONSILLECTOMY    . TRANSTHORACIC ECHOCARDIOGRAM  10-15-2016  dr Cathie Olden   grade 1 diastolic dysfunction, ef 35-57%/  moderate AV stenosis (valve area 1.012cm^2, mean gradiant 22mmHg, peak grandiant 70mmHg)/ moderate LAE/  trivial PR/  mild RAE       Home Medications    Prior to Admission medications   Medication Sig Start Date End Date Taking? Authorizing Provider  acetaminophen (TYLENOL) 500 MG tablet Take 1,000 mg by mouth every 6 (six) hours as needed (shoulder pain).   Yes [provider]  amLODipine (NORVASC) 2.5 MG tablet Take 2.5 mg by mouth daily.  Yes [provider]  aspirin EC 81 MG tablet Take 1 tablet (81 mg total) by mouth daily. 10/09/16  Yes Bonnielee Haff, MD  atorvastatin (LIPITOR) 40 MG tablet TAKE ONE TABLET BY MOUTH ONCE DAILY AT 6 PM Patient taking differently: TAKE ONE TABLET (40 MG)  BY MOUTH ONCE DAILY AT 6 PM--- 09/13/16  Yes Nahser, Wonda Cheng, MD  B Complex-C (B-COMPLEX WITH VITAMIN C) tablet Take 1 tablet by mouth daily.   Yes [provider]  carvedilol (COREG) 6.25 MG tablet Take 6.25 mg by mouth 2 (two) times daily with a meal.   Yes [provider]  Cholecalciferol (VITAMIN D-3 PO) Take 1 tablet by mouth daily.    Yes [provider]  donepezil (ARICEPT) 10 MG tablet Take 10 mg by mouth at bedtime.  05/04/16  Yes [provider]    glimepiride (AMARYL) 1 MG tablet Take 1 mg by mouth daily with breakfast.  05/29/16  Yes [provider]  isosorbide mononitrate (IMDUR) 60 MG 24 hr tablet Take 120 mg by mouth daily.   Yes [provider]  linagliptin (TRADJENTA) 5 MG TABS tablet Take 1 tablet (5 mg total) by mouth daily. For blood sugar Patient taking differently: Take 5 mg by mouth daily. For blood sugar 11/04/15  Yes Johnson, Clanford L, MD  nitroGLYCERIN (NITROSTAT) 0.4 MG SL tablet PLACE ONE TABLET UNDER THE TONGUE EVERY 5 MINUTES AS NEEDED FOR CHEST PAIN. 08/16/16  Yes Nahser, Wonda Cheng, MD  Propylene Glycol (SYSTANE BALANCE) 0.6 % SOLN Place 1-2 drops into both eyes 3 (three) times daily as needed (for dry eyes).    Yes [provider]  RANEXA 1000 MG SR tablet TAKE ONE TABLET BY MOUTH TWICE DAILY Patient taking differently: TAKE ONE TABLET (1000 MG) BY MOUTH TWICE DAILY 08/20/16  Yes Nahser, Wonda Cheng, MD  capecitabine (XELODA) 500 MG tablet Take 3 tablets (1,500 mg total) by mouth 2 (two) times daily after a meal. 12/29/16   Truitt Merle, MD  isosorbide mononitrate (IMDUR) 120 MG 24 hr tablet Take 1 tablet (120 mg total) by mouth daily. Patient not taking: Reported on 01/01/2017 07/20/16   Reyne Dumas, MD  oxyCODONE (OXY IR/ROXICODONE) 5 MG immediate release tablet Take 1 tablet (5 mg total) by mouth every 6 (six) hours as needed. Patient not taking: Reported on 01/01/2017 05/15/08   Leighton Ruff, MD    Family History Family History  Problem Relation Age of Onset  . Cancer Mother        breast cancer   . Ulcers Father 75  . Heart attack Brother 62    Social History Social History  Substance Use Topics  . Smoking status: Former Smoker    Packs/day: 1.00    Years: 39.00    Types: Cigarettes    Quit date: 05/10/1992  . Smokeless tobacco: Never Used  . Alcohol use 1.2 oz/week    2 Shots of liquor per week     Comment: occasionally.     Allergies   Metformin and related   Review of  Systems Review of Systems  Constitutional: Positive for fatigue. Negative for chills and fever.  HENT: Negative for congestion and rhinorrhea.   Eyes: Negative for visual disturbance.  Respiratory: Negative for cough, shortness of breath and wheezing.   Cardiovascular: Negative for chest pain and leg swelling.  Gastrointestinal: Positive for blood in stool. Negative for abdominal pain, diarrhea, nausea and vomiting.  Genitourinary: Negative for dysuria and flank pain.  Musculoskeletal: Negative for neck pain and neck stiffness.  Skin: Negative for rash and wound.  Allergic/Immunologic: Negative for immunocompromised state.  Neurological: Positive for syncope. Negative for weakness and headaches.  All other systems reviewed and are negative.    Physical Exam Updated Vital Signs BP (!) 146/68 (BP Location: Right Arm)   Pulse 77   Temp 98.5 F (36.9 C) (Oral)   Resp 13   Ht 5\' 9"  (1.753 m)   Wt 62.4 kg (137 lb 9.6 oz)   SpO2 96%   BMI 20.32 kg/m   Physical Exam  Constitutional: He is oriented to person, place, and time. He appears well-developed and well-nourished. No distress.  HENT:  Head: Normocephalic and atraumatic.  Eyes: Conjunctivae are normal.  Neck: Neck supple.  Cardiovascular: Normal rate, regular rhythm and normal heart sounds.  Exam reveals no friction rub.   No murmur heard. Pulmonary/Chest: Effort normal and breath sounds normal. No respiratory distress. He has no wheezes. He has no rales.  Abdominal: He exhibits no distension.  Musculoskeletal: He exhibits no edema.  Neurological: He is alert and oriented to person, place, and time. He exhibits normal muscle tone.  Skin: Skin is warm. Capillary refill takes less than 2 seconds.  Psychiatric: He has a normal mood and affect.  Nursing note and vitals reviewed.    ED Treatments / Results  Labs (all labs ordered are listed, but only abnormal results are displayed) Labs Reviewed  CBC WITH  DIFFERENTIAL/PLATELET - Abnormal; Notable for the following:       Result Value   RBC 2.30 (*)    Hemoglobin 8.1 (*)    HCT 23.7 (*)    MCV 103.0 (*)    MCH 35.2 (*)    Platelets 135 (*)    All other components within normal limits  COMPREHENSIVE METABOLIC PANEL - Abnormal; Notable for the following:    Glucose, Bld 146 (*)    Calcium 8.0 (*)    Total Protein 5.9 (*)    Albumin 2.5 (*)    All other components within normal limits  BRAIN NATRIURETIC PEPTIDE - Abnormal; Notable for the following:    B Natriuretic Peptide 440.2 (*)    All other components within normal limits  TROPONIN I - Abnormal; Notable for the following:    Troponin I 0.03 (*)    All other components within normal limits  CBC - Abnormal; Notable for the following:    RBC 3.03 (*)    Hemoglobin 10.8 (*)    HCT 31.5 (*)    MCV 104.0 (*)    MCH 35.6 (*)    All other components within normal limits  TROPONIN I - Abnormal; Notable for the following:    Troponin I 0.03 (*)    All other components within normal limits  TROPONIN I - Abnormal; Notable for the following:    Troponin I 0.03 (*)    All other components within normal limits  TROPONIN I - Abnormal; Notable for the following:    Troponin I 0.03 (*)    All other components within normal limits  BASIC METABOLIC PANEL - Abnormal; Notable for the following:    Glucose, Bld 108 (*)    Calcium 8.3 (*)    All other components within normal limits  CBC - Abnormal; Notable for the following:    RBC 2.88 (*)    Hemoglobin 9.9 (*)    HCT 29.7 (*)    MCV 103.1 (*)    MCH 34.4 (*)  All other components within normal limits  GLUCOSE, CAPILLARY - Abnormal; Notable for the following:    Glucose-Capillary 113 (*)    All other components within normal limits  I-STAT CHEM 8, ED - Abnormal; Notable for the following:    Glucose, Bld 134 (*)    Hemoglobin 8.8 (*)    HCT 26.0 (*)    All other components within normal limits  MRSA PCR SCREENING  MAGNESIUM    MAGNESIUM  GLUCOSE, CAPILLARY  I-STAT TROPONIN, ED  TYPE AND SCREEN    EKG  EKG Interpretation  Date/Time:  Saturday January 01 2017 15:39:52 EDT Ventricular Rate:  64 PR Interval:    QRS Duration: 138 QT Interval:  490 QTC Calculation: 505 R Axis:   102 Text Interpretation:  Undetermined rhythm Rightward axis Non-specific intra-ventricular conduction block Cannot rule out Septal infarct , age undetermined Abnormal ECG Since last EKG, rate has decreased There are frequent PVCs Confirmed by Duffy Bruce [phone]) on 01/02/2017 9:04:23 AM       Radiology Dg Chest 2 View  Result Date: 01/01/2017 CLINICAL DATA:  Bradycardia EXAM: CHEST  2 VIEW COMPARISON:  10/06/2016 FINDINGS: Severe chronic interstitial lung disease again noted. Heart is normal size. No effusions. No acute bony abnormality. No definite acute process. IMPRESSION: Severe chronic interstitial lung disease. No definite acute process. Electronically Signed   By: Rolm Baptise M.D.   On: 01/01/2017 17:43    Procedures .Critical Care Performed by: Duffy Bruce Authorized by: Duffy Bruce   Critical care provider statement:    Critical care time (minutes):  35   Critical care time was exclusive of:  Separately billable procedures and treating other patients and teaching time   Critical care was necessary to treat or prevent imminent or life-threatening deterioration of the following conditions:  Circulatory failure and cardiac failure   Critical care was time spent personally by me on the following activities:  Development of treatment plan with patient or surrogate, discussions with consultants, evaluation of patient's response to treatment, examination of patient, obtaining history from patient or surrogate, ordering and performing treatments and interventions, ordering and review of laboratory studies, ordering and review of radiographic studies, pulse oximetry, re-evaluation of patient's condition and review of old  charts   I assumed direction of critical care for this patient from another provider in my specialty: no     (including critical care time)  Medications Ordered in ED Medications  acetaminophen (TYLENOL) tablet 1,000 mg (not administered)  atorvastatin (LIPITOR) tablet 40 mg (not administered)  polyvinyl alcohol (LIQUIFILM TEARS) 1.4 % ophthalmic solution 1-2 drop (not administered)  ranolazine (RANEXA) 12 hr tablet 1,000 mg (1,000 mg Oral Given 01/01/17 2210)  nitroGLYCERIN (NITROSTAT) SL tablet 0.4 mg (not administered)  B-complex with vitamin C tablet 1 tablet (not administered)  donepezil (ARICEPT) tablet 10 mg (10 mg Oral Given 01/01/17 2210)  sodium chloride flush (NS) 0.9 % injection 3 mL (3 mLs Intravenous Given 01/01/17 2214)  0.9 %  sodium chloride infusion ( Intravenous Rate/Dose Verify 01/02/17 0700)  albuterol (PROVENTIL) (2.5 MG/3ML) 0.083% nebulizer solution 2.5 mg (not administered)  insulin aspart (novoLOG) injection 0-9 Units (not administered)  feeding supplement (ENSURE ENLIVE) (ENSURE ENLIVE) liquid 237 mL (not administered)  atropine injection 0.4 mg (0.4 mg Intravenous Given 01/01/17 1650)  magnesium sulfate IVPB 2 g 50 mL (0 g Intravenous Stopped 01/01/17 2308)     Initial Impression / Assessment and Plan / ED Course  I have reviewed the triage vital signs  and the nursing notes.  Pertinent labs & imaging results that were available during my care of the patient were reviewed by me and considered in my medical decision making (see chart for details).     83 yo M here with bradycardia and syncope. EKG non-ischemic, no CP. Concern for acute symptomatic bradycardia/intermittent HB, possible non-sustained VT given marked ectopy on monitor, versus possible syncope 2/2 AS, arrhythmia in setting of ongoing LGIB due to known rectal CA. Will check type and screen, labs. Atropine given due to palpable pulse in 30s on arrival with improvement. Protecting airway.   Labs as  above. Will admit to SDU for high risk syncope with possible primary arrhythmia versus anemia.  Final Clinical Impressions(s) / ED Diagnoses   Final diagnoses:  Bigeminy  Acute blood loss anemia  Frequent PVCs  Syncope and collapse    New Prescriptions Current Discharge Medication List       Duffy Bruce, MD 01/02/17 [phone]

## 2017-01-02 DIAGNOSIS — I472 Ventricular tachycardia: Secondary | ICD-10-CM | POA: Diagnosis not present

## 2017-01-02 DIAGNOSIS — R001 Bradycardia, unspecified: Secondary | ICD-10-CM | POA: Diagnosis not present

## 2017-01-02 DIAGNOSIS — I5032 Chronic diastolic (congestive) heart failure: Secondary | ICD-10-CM | POA: Diagnosis not present

## 2017-01-02 DIAGNOSIS — E119 Type 2 diabetes mellitus without complications: Secondary | ICD-10-CM | POA: Diagnosis not present

## 2017-01-02 DIAGNOSIS — R55 Syncope and collapse: Principal | ICD-10-CM

## 2017-01-02 DIAGNOSIS — D649 Anemia, unspecified: Secondary | ICD-10-CM | POA: Diagnosis not present

## 2017-01-02 DIAGNOSIS — E785 Hyperlipidemia, unspecified: Secondary | ICD-10-CM | POA: Diagnosis not present

## 2017-01-02 DIAGNOSIS — I959 Hypotension, unspecified: Secondary | ICD-10-CM | POA: Diagnosis not present

## 2017-01-02 DIAGNOSIS — I480 Paroxysmal atrial fibrillation: Secondary | ICD-10-CM | POA: Diagnosis not present

## 2017-01-02 DIAGNOSIS — I10 Essential (primary) hypertension: Secondary | ICD-10-CM

## 2017-01-02 DIAGNOSIS — I493 Ventricular premature depolarization: Secondary | ICD-10-CM | POA: Diagnosis not present

## 2017-01-02 DIAGNOSIS — I350 Nonrheumatic aortic (valve) stenosis: Secondary | ICD-10-CM

## 2017-01-02 DIAGNOSIS — I2510 Atherosclerotic heart disease of native coronary artery without angina pectoris: Secondary | ICD-10-CM

## 2017-01-02 LAB — BASIC METABOLIC PANEL
Anion gap: 7 (ref 5–15)
BUN: 11 mg/dL (ref 6–20)
CO2: 24 mmol/L (ref 22–32)
CREATININE: 0.89 mg/dL (ref 0.61–1.24)
Calcium: 8.3 mg/dL — ABNORMAL LOW (ref 8.9–10.3)
Chloride: 105 mmol/L (ref 101–111)
GFR calc Af Amer: >60 mL/min (ref >60)
Glucose, Bld: 108 mg/dL — ABNORMAL HIGH (ref 65–99)
Potassium: 3.7 mmol/L (ref 3.5–5.1)
SODIUM: 136 mmol/L (ref 135–145)

## 2017-01-02 LAB — GLUCOSE, CAPILLARY
GLUCOSE-CAPILLARY: 214 mg/dL — AB (ref 65–99)
GLUCOSE-CAPILLARY: 147 mg/dL — AB (ref 65–99)
GLUCOSE-CAPILLARY: 186 mg/dL — AB (ref 65–99)
Glucose-Capillary: 94 mg/dL (ref 65–99)

## 2017-01-02 LAB — MRSA PCR SCREENING: MRSA by PCR: NEGATIVE

## 2017-01-02 LAB — TROPONIN I
TROPONIN I: 0.03 ng/mL — AB (ref <0.03)
TROPONIN I: 0.03 ng/mL — AB (ref <0.03)

## 2017-01-02 LAB — CBC
HEMATOCRIT: 29.7 % — AB (ref 39.0–52.0)
Hemoglobin: 9.9 g/dL — ABNORMAL LOW (ref 13.0–17.0)
MCH: 34.4 pg — ABNORMAL HIGH (ref 26.0–34.0)
MCHC: 33.3 g/dL (ref 30.0–36.0)
MCV: 103.1 fL — AB (ref 78.0–100.0)
Platelets: 172 10*3/uL (ref 150–400)
RBC: 2.88 MIL/uL — ABNORMAL LOW (ref 4.22–5.81)
RDW: 13.2 % (ref 11.5–15.5)
WBC: 6.9 10*3/uL (ref 4.0–10.5)

## 2017-01-02 LAB — MAGNESIUM: Magnesium: 2 mg/dL (ref 1.7–2.4)

## 2017-01-02 MED ORDER — POTASSIUM CHLORIDE CRYS ER 20 MEQ PO TBCR
40.0000 meq | EXTENDED_RELEASE_TABLET | Freq: Once | ORAL | Status: AC
  Administered 2017-01-02: 40 meq via ORAL
  Filled 2017-01-02: qty 2

## 2017-01-02 NOTE — Consult Note (Signed)
Cardiology Consultation:   Patient ID: [name]; [id number]; 1923/08/19   Admit date: 01/01/2017 Date of Consult: 01/02/2017  Primary Care Provider: Lavone Orn, MD Primary Cardiologist: [place] 11/25/2016 Primary Electrophysiologist:  n/a   Patient Profile:   [name] is a 83 y.o. male with a hx of PCI to the LAD 2012, NSTEMI 2015 w/ med mgt, stent patent. Chronic PVC's and PACs, HTN, DM, HLD, chronic systolic heart failure, atrial fib managed with rate control and anticoagulation. He had a failed attempt at [place] in March due to inability to pass the probe into the esophagus. His EF is 35 to 40% with mild to moderate AS, bilateral mild carotid disease, recurrent colon cancer, stage IV rectal cancer/adenocarcinoma with suspected liver metastasis, supposed to start palliative radiation and Xeloda, mild chronic intermittent rectal bleeding related to rectal cancer.   He is being seen today for the evaluation of syncope at the request of Dr Algis Liming.  History of Present Illness:   [name] has been feeling a little bad recently. He has been losing weight, no change in eating habits. He occasionally takes SL NTG, it works on his chest pain. He cannot pinpoint the last time he used it. He saw Dr Burr Medico with oncology 08/21 and was started on Xeloda.  He has been having spells 3-4 x week of mild dizziness and feeling light-headed. He does not wake with them, they start during the day, times vary. They last for hours, may last all day.  He has been using a cane for ambulation because of the dizziness. No unilateral sx. No palpitations. Never feels his arrhythmia (frequent PACs and PVCs at baseline). Family reports that his exercise tolerance has been decreasing, his stamina has become very poor. He still drives.  Yesterday, he walked from the front>back of the grocery store, got seafood and walked back to the front without resting.  When he was trying to pay, he fumbled  putting the card into the machine, was feeling more dizzy and weak than usual. No CP, SOB, or palpitations. The next thing he remembers is being in a chair. His friend was with him, she and another person knew he was not "with it". They helped him to a chair, he was non-verbal, but still with some motor control. No seizure activity or incontinence noted. With their help, he did not fall.  He woke up in the chair, EMS was present, HR reportedly in the 40s, no documentation. When pt woke, he felt about as usual.    Past Medical History:  Diagnosis Date  . Aortic stenosis, moderate cardiolgosit-  dr Cathie Olden   per last echo 10-15-2016 aortic stenosis somewhat worse since last echo 11-14-2015 moderate calcification and thickened leaflets, valve area 1.01cm^2,  mead gradiant 60mmHg, peak grandiant 91mmHgf  . Barrett esophagus dx 2015  . Chronic stable angina ([place])   . Chronic systolic CHF (congestive heart failure) ([place]) followed by dr Cathie Olden   a. EF previously 35-40%. 03/ 2013 b. improved to 55-65% by cath 08/2013./  per echo 06/ 2018 ef 50-55%  . CKD (chronic kidney disease), stage III   . Constipation   . Coronary artery disease cardiologist-- dr Cathie Olden   a. s/p PCI w/ DES to mLAD 08/05/10. b. NSTEMI 08/2013 (mildly elev trop) - cath showing widely patent stent, 80% prox D1 (small vessel) with recommendation for medical management, residual 60% ostial PDA, <20% LCx, LVEF 55-65%.   . Depression   . History of colon cancer, stage  III oncologist- dr Waymon Budge--  per last note no recurrence 2013   dx 10/ 1992,  Stage III-- treament post right colecotmy, chemoradiation/  1993 localized recurrent mesenteric mass -- treatment post resection mass and chemoradiation  . History of kidney stones   . History of non-ST elevation myocardial infarction (NSTEMI)    08-14-2013  . History of small bowel obstruction    03/ 2005 partial SBO due to adhesions  post lysis adhesions and small bowel resection  .  Hyperlipidemia   . Hypertension   . Macrocytic anemia dx 1992   chronic  . PAF (paroxysmal atrial fibrillation) [facility]) cardiologist-  dr Cathie Olden   a. failed TEE due to inability to pass probe into the esophagus in March 2013. b. On Coumadin. Was in NSR 08/2013 admission.  . Premature atrial contractions   . Premature ventricular contractions   . RBBB (right bundle branch block)   . Rectal mass   . S/P drug eluting coronary stent placement 08/05/2010   DES x1  to midLAD  . Type II diabetes mellitus ([place])   . Wears glasses     Past Surgical History:  Procedure Laterality Date  . ABDOMINAL MASS RESECTION  11/1991   localized recurrent colon cancer mesenteric lymph node mass (area of distal aorta and vena cava) and debulking  . CARDIAC CATHETERIZATION  08/03/10   dr Lia Foyer   preserved LVSF, mod. high grade stenosis LAD, diagonal stenosis at the bend 60-70%  . CARDIAC CATHETERIZATION  08-09-2016  dr Martinique   ostialLAD 30%, wide patent mLAD stent, pCFX 40%,  mild irregularities throughout RCA < 20%  . CARDIAC CATHETERIZATION N/A 05/20/2016   Procedure: Left Heart Cath and Coronary Angiography;  Surgeon: Lorretta Harp, MD;  Location: [facility];  Service: Cardiovascular;  Laterality: N/A; normal LVF,  D2 80%.  ostRCA 50%,  LAD stent patent  . CARDIOVASCULAR STRESS TEST  09-01-2016   dr Cathie Olden   Intermediate risk nuclear study w/ medium non-reversible defect of moderate severity in the basal inferolateral and mid inferolateral location/ no ST segment deviation noted during stress,  nuclear stress ef 48% (45-54%)  . CATARACT EXTRACTION W/ INTRAOCULAR LENS  IMPLANT, BILATERAL    . CORONARY ANGIOPLASTY WITH STENT PLACEMENT  08/05/10   dr Lia Foyer   PCI and DES x1  LAD  . CYSTO/  URETEROSCOPIC STONE EXTRACTION Left 10/18/2002  . CYSTOSCOPY WITH RETROGRADE PYELOGRAM, URETEROSCOPY AND STENT PLACEMENT Bilateral 05/18/2013   Procedure: CYSTOSCOPY WITH BILATERAL RETROGRADE PYELOGRAM, LEFT  URETEROSCOPY AND LEFT STENT PLACEMENT;  Surgeon: Alexis Frock, MD;  Location: [facility];  Service: Urology;  Laterality: Bilateral;  . EXPLORATORY LAPAROTOMY W/ LYSIS ADHESIONS AND SMALL BOWEL RESECTION  07/09/2003   partial SBO  . FLEXIBLE SIGMOIDOSCOPY N/A 10/07/2016   Procedure: FLEXIBLE SIGMOIDOSCOPY;  Surgeon: Wilford Corner, MD;  Location: [facility];  Service: Endoscopy;  Laterality: N/A;  . LAPAROSCOPIC CHOLECYSTECTOMY  12-04-2009  dr Margot Chimes  . LEFT HEART CATHETERIZATION WITH CORONARY ANGIOGRAM N/A 08/15/2013   Procedure: LEFT HEART CATHETERIZATION WITH CORONARY ANGIOGRAM;  Surgeon: Peter M Martinique, MD;  Location: [facility] CATH LAB;  Service: Cardiovascular;  Laterality: N/A; single vessel obstructive CAD involving a small diagonal branch, patent LAD stent , normal LVF  . RIGHT COLECTOMY  03/1991   w/ appendectomy  . TONSILLECTOMY    . TRANSTHORACIC ECHOCARDIOGRAM  10-15-2016  dr Cathie Olden   grade 1 diastolic dysfunction, ef 35-00%/  moderate AV stenosis (valve area 1.012cm^2, mean gradiant 45mmHg, peak grandiant 48mmHg)/ moderate  LAE/  trivial PR/  mild RAE     Inpatient Medications: Scheduled Meds: . atorvastatin  40 mg Oral q1800  . B-complex with vitamin C  1 tablet Oral Daily  . donepezil  10 mg Oral QHS  . feeding supplement (ENSURE ENLIVE)  237 mL Oral BID BM  . insulin aspart  0-9 Units Subcutaneous TID WC  . ranolazine  1,000 mg Oral BID  . sodium chloride flush  3 mL Intravenous Q12H   Continuous Infusions:  PRN Meds: acetaminophen, albuterol, nitroGLYCERIN, polyvinyl alcohol Medication Sig  acetaminophen (TYLENOL) 500 MG tablet Take 1,000 mg by mouth every 6 (six) hours as needed (shoulder pain).  amLODipine (NORVASC) 2.5 MG tablet Take 2.5 mg by mouth daily.  aspirin EC 81 MG tablet Take 1 tablet (81 mg total) by mouth daily.  atorvastatin (LIPITOR) 40 MG tablet TAKE ONE TABLET BY MOUTH ONCE DAILY AT 6 PM Patient taking differently: TAKE ONE TABLET (40 MG)  BY MOUTH ONCE  DAILY AT 6 PM---  B Complex-C (B-COMPLEX WITH VITAMIN C) tablet Take 1 tablet by mouth daily.  carvedilol (COREG) 6.25 MG tablet Take 6.25 mg by mouth 2 (two) times daily with a meal.  Cholecalciferol (VITAMIN D-3 PO) Take 1 tablet by mouth daily.   donepezil (ARICEPT) 10 MG tablet Take 10 mg by mouth at bedtime.   glimepiride (AMARYL) 1 MG tablet Take 1 mg by mouth daily with breakfast.   isosorbide mononitrate (IMDUR) 60 MG 24 hr tablet Take 120 mg by mouth daily.  linagliptin (TRADJENTA) 5 MG TABS tablet Take 1 tablet (5 mg total) by mouth daily. For blood sugar Patient taking differently: Take 5 mg by mouth daily. For blood sugar  nitroGLYCERIN (NITROSTAT) 0.4 MG SL tablet PLACE ONE TABLET UNDER THE TONGUE EVERY 5 MINUTES AS NEEDED FOR CHEST PAIN.  Propylene Glycol (SYSTANE BALANCE) 0.6 % SOLN Place 1-2 drops into both eyes 3 (three) times daily as needed (for dry eyes).   RANEXA 1000 MG SR tablet TAKE ONE TABLET BY MOUTH TWICE DAILY Patient taking differently: TAKE ONE TABLET (1000 MG) BY MOUTH TWICE DAILY  capecitabine (XELODA) 500 MG tablet Take 3 tablets (1,500 mg total) by mouth 2 (two) times daily after a meal.  isosorbide mononitrate (IMDUR) 120 MG 24 hr tablet Take 1 tablet (120 mg total) by mouth daily. Patient not taking: Reported on 01/01/2017  oxyCODONE (OXY IR/ROXICODONE) 5 MG immediate release tablet Take 1 tablet (5 mg total) by mouth every 6 (six) hours as needed. Patient not taking: Reported on 01/01/2017    Allergies:    Allergies  Allergen Reactions  . Metformin And Related Diarrhea and Nausea And Vomiting    Social History:   Social History   Social History  . Marital status: Single    Spouse name: N/A  . Number of children: 0  . Years of education: N/A   Occupational History  . Retired    Social History Main Topics  . Smoking status: Former Smoker    Packs/day: 1.00    Years: 39.00    Types: Cigarettes    Quit date: 05/10/1992  . Smokeless tobacco:  Never Used  . Alcohol use 1.2 oz/week    2 Shots of liquor per week     Comment: occasionally.  . Drug use: No  . Sexual activity: No   Other Topics Concern  . Not on file   Social History Narrative   Lives alone.      Family History:  The patient's family history includes Cancer in his mother; Heart attack (age of onset: 60) in his brother; Ulcers (age of onset: 60) in his father. Pt indicated that his mother is deceased. He indicated that his father is deceased. He indicated that the status of his brother is unknown. He indicated that his maternal grandmother is deceased. He indicated that his maternal grandfather is deceased. He indicated that his paternal grandmother is deceased. He indicated that his paternal grandfather is deceased.    ROS:  Please see the history of present illness.  All other ROS reviewed and negative.      Physical Exam/Data:   Vitals:   01/01/17 2333 01/02/17 0317 01/02/17 0527 01/02/17 0745  BP: (!) 131/48 (!) 119/49  (!) 146/68  Pulse: 66 74  77  Resp: 16 19  13   Temp: 98.3 F (36.8 C) 98 F (36.7 C)  98.5 F (36.9 C)  TempSrc: Oral Oral  Oral  SpO2: 94% 94%  96%  Weight:   137 lb 9.6 oz (62.4 kg)   Height:        Intake/Output Summary (Last 24 hours) at 01/02/17 0947 Last data filed at 01/02/17 0900  Gross per 24 hour  Intake           833.75 ml  Output              650 ml  Net           183.75 ml   Filed Weights   01/01/17 1557 01/02/17 0527  Weight: 140 lb (63.5 kg) 137 lb 9.6 oz (62.4 kg)   Body mass index is 20.32 kg/m.  General:  Well nourished, well developed, in no acute distress HEENT: normal Lymph: no adenopathy Neck: JVD at 8-9 cm Endocrine:  No thryomegaly Vascular: No carotid bruits; FA pulses 2+ bilaterally without bruits  Cardiac:  normal S1, S2; RRR; ++ murmur, difficult to hear due to equipment Lungs: fine dry rales bilaterally, no wheezing, rhonchi  Abd: soft, nontender, no hepatomegaly  Ext: no  edema Musculoskeletal:  No deformities, BUE and BLE strength normal and equal Skin: warm and dry  Neuro:  CNs 2-12 intact, no focal abnormalities noted Psych:  Normal affect   EKG:  The EKG was personally reviewed and demonstrates:  Sinus brady, bigeminy PVCs and pairs. 1 run of NSVT Telemetry:  Telemetry was personally reviewed and demonstrates:  Frequent PVCs  Relevant CV Studies: ECHO: 10/15/2016 - Left ventricle: The cavity size was normal. Wall thickness was   normal. Systolic function was normal. The estimated ejection   fraction was in the range of 50% to 55%. Wall motion was normal;   there were no regional wall motion abnormalities. Doppler   parameters are consistent with abnormal left ventricular   relaxation (grade 1 diastolic dysfunction). - Aortic valve: Moderately calcified annulus. Moderately thickened,   moderately calcified leaflets. There was moderate stenosis. Valve   area (VTI): 1.01 cm^2. Valve area (Vmax): 1.04 cm^2. Valve area   (Vmean): 0.97 cm^2. - Left atrium: The atrium was moderately dilated. - Right atrium: The atrium was mildly dilated.  CARDIAC CATH: 11/03/2016  The left ventricular systolic function is normal.  LV end diastolic pressure is normal.  Prox LAD to Mid LAD lesion, 0 %stenosed.  2nd Diag lesion, 80 %stenosed.  Ost RCA lesion, 50 %stenosed.  Diagnostic Diagram    IMPRESSION: [name] has a widely patent LAD stent with no significant CAD and unchanged anatomy compared to his last  cath 3 years ago. He has chronic stable angina possibly related to a small diagonal branch stenosis. His groin was successfully sealed with a minx device and he left the lab in stable condition. He can be discharged home tomorrow and begin Coumadin anticoagulation as an outpatient. He is currently in sinus rhythm but has a history of PAF. His attending cardiologist Dr. Liam Rogers is aware of these results.  Laboratory Data:  Chemistry Recent Labs Lab  12/28/16 1549 01/01/17 1600 01/01/17 1634 01/02/17 0645  NA 135* 136 139 136  K 4.6 4.0 3.8 3.7  CL  --  107 106 105  CO2 24 24  --  24  GLUCOSE 188* 146* 134* 108*  BUN 20.2 16 16 11   CREATININE 1.2 1.02 0.90 0.89  CALCIUM 9.3 8.0*  --  8.3*  GFRNONAA  --  >60  --  >60  GFRAA  --  >60  --  >60  ANIONGAP 5 5  --  7     Recent Labs Lab 12/28/16 1549 01/01/17 1600  PROT 7.2 5.9*  ALBUMIN 3.3* 2.5*  AST 23 23  ALT 20 19  ALKPHOS 109 79  BILITOT 0.71 0.7   Hematology Recent Labs Lab 01/01/17 1600 01/01/17 1634 01/01/17 2227 01/02/17 0645  WBC 6.0  --  7.7 6.9  RBC 2.30*  --  3.03* 2.88*  HGB 8.1* 8.8* 10.8* 9.9*  HCT 23.7* 26.0* 31.5* 29.7*  MCV 103.0*  --  104.0* 103.1*  MCH 35.2*  --  35.6* 34.4*  MCHC 34.2  --  34.3 33.3  RDW 13.2  --  13.4 13.2  PLT 135*  --  192 172   Cardiac Enzymes Recent Labs Lab 01/01/17 1600 01/01/17 2227 01/02/17 0152 01/02/17 0645  TROPONINI 0.03* 0.03* 0.03* 0.03*    Recent Labs Lab 01/01/17 1632  TROPIPOC 0.00    BNP Recent Labs Lab 01/01/17 1546  BNP 440.2*    DDimer No results for input(s): DDIMER in the last 168 hours.  Radiology/Studies:  Dg Chest 2 View  Result Date: 01/01/2017 CLINICAL DATA:  Bradycardia EXAM: CHEST  2 VIEW COMPARISON:  10/06/2016 FINDINGS: Severe chronic interstitial lung disease again noted. Heart is normal size. No effusions. No acute bony abnormality. No definite acute process. IMPRESSION: Severe chronic interstitial lung disease. No definite acute process. Electronically Signed   By: Rolm Baptise M.D.   On: 01/01/2017 17:43    Assessment and Plan:   Principal Problem: 1.  Syncope and collapse - pt has been having problems with dizziness and weakness for a while.  - He was started on Xeloda this week, which can cause fatigue. - Advised pt no driving for 6 months - no palpitations, but he does not feel his PVCs. - d/c Coreg - will need a monitor - discuss with MD if some of the  PVCs are escape beats, may need PPM if HR does not improve off BB - he may also be orthostatic, review meds for causes - ck orthostatic VS  2.   Paroxysmal atrial fibrillation (HCC) - in SR on telemetry review  3.   Bradycardia - see above  4.  Coronary artery disease involving native heart without angina pectoris - unclear if some of his sx might be related to CAD (weakness) - Imdur may be causing orthostatic sx, discuss with MD - He has periodic angina, rx w/ nitro - mild trop elevation - will get MV in am.  Otherwise, per IM Active Problems:  Hypertension   Hyperlipidemia   BRBPR (bright red blood per rectum)   Thrombocytopenia (HCC)   Non-insulin dependent type 2 diabetes mellitus (HCC)   Anemia, unspecified   Malnutrition of moderate degree   Chronic diastolic heart failure (HCC)   Rectal cancer (HCC)   Iron deficiency anemia due to chronic blood loss  Signed, Rosaria Ferries, PA-C  01/02/2017 9:47 AM   The patient was seen, examined and discussed with Rosaria Ferries, PA-C and I agree with the above.   83 y.o. male with a hx of PCI to the LAD 2012, NSTEMI 2015 w/ med mgt, stent patent. Chronic PVC's and PACs, HTN, DM, HLD, chronic systolic heart failure, parox atrial fib managed with rate control and anticoagulation. He had a failed attempt at [place] in March due to inability to pass the probe into the esophagus. His EF is 35 to 40% with mild to moderate AS, bilateral mild carotid disease, recurrent colon cancer, stage IV rectal cancer/adenocarcinoma with suspected liver metastasis, supposed to start palliative radiation and Xeloda tomorrow. The patient was admitted after a syncopal episode, he was found to be bradycardic in 20' on admission, on carvedilol 6.25 mg po BID. Currently in SR with very frequent PVCs and a run of nsVT for total of 11 beats.   The differential for syncope includes: 1. Bradycardia induced by carvedilol, we will hold for now 2. Ischemia - the  patient had a cardiac cath in January 2018 with 80% stenosis in diagonal artery, medical management was recommended, he is denying chest pain, but has atypical shoulder pain 3. Aortic stenosis - moderate in June 2018, we will repeat echo  Plan: 1. Repeat echo 2. Stress test - Lexiscan nuclear to evaluate for ischemia in diagonal territory 3. Discharge possibly with carvedilol 3.125 mg po BID   Ena Dawley, MD 01/02/2017

## 2017-01-02 NOTE — Evaluation (Signed)
Physical Therapy Evaluation Patient Details Name: [name] MRN: [id number] DOB: [birth year] Today's Date: 01/02/2017   History of Present Illness  [name] is a 83 year old single male, lives alone, mostly independent of activities of daily living but lately uses a cane for ambulation, PMH of CAD status post stent July 2012 with chronic angina, HTN, HLD, DM 2, PAF, bilateral mild carotid disease, recurrent colon cancer, stage IV rectal cancer/adenocarcinoma with suspected liver metastasis for which he is supposed to start palliative radiation and Xeloda, mild chronic intermittent rectal bleeding related to rectal cancer, presented to the ED on 01/01/17 after he passed out at a grocery store on the day of admission. Per chart review, with EMS heart rate on the monitor was in the 40s but clinically it was 28 bpm and he had SBP in the 90s. He was bolused with IV normal saline and his blood pressures improved.   Clinical Impression   Pt admitted with above diagnosis. Pt currently with functional limitations due to the deficits listed below (see PT Problem List). Overall getting up, OOB, and moving well; physical activity was limited to in-room as he was still connected to Zoll at time of eval; using cane well; no reports of dizziness throughout PT eval; see below for Orthostatics;  Noted a drop in SBP between supine and standing of 21 mmHg, asymptomatic; Pt will benefit from skilled PT to increase their independence and safety with mobility to allow discharge to the venue listed below.       Follow Up Recommendations Outpatient PT;Supervision - Intermittent;Other (comment) (Outpt PT can be set up by PCP later when pt requests)    Equipment Recommendations  None recommended by PT    Recommendations for Other Services       Precautions / Restrictions Precautions Precautions: Fall (admitted with syncopal episode) Precaution Comments: Consider checking orthostatics      Mobility   Bed Mobility Overal bed mobility: Independent                Transfers Overall transfer level: Modified independent Equipment used: Straight cane             General transfer comment: appropriate UE support  Ambulation/Gait Ambulation/Gait assistance: Supervision Ambulation Distance (Feet):  (March in place; connected to zoll machine on cart) Assistive device: Straight cane       General Gait Details: No gross difficulty marching in place with cane  Stairs            Wheelchair Mobility    Modified Rankin (Stroke Patients Only)       Balance                                             Pertinent Vitals/Pain Pain Assessment: No/denies pain    Home Living Family/patient expects to be discharged to:: Private residence Living Arrangements: Alone Available Help at Discharge: Family;Available PRN/intermittently Type of Home: House Home Access: Level entry     Home Layout: One level Home Equipment: Cane - single point      Prior Function Level of Independence: Independent with assistive device(s) (cane lately)         Comments: Goes out to lunch daily with group of friends     Hand Dominance        Extremity/Trunk Assessment   Upper Extremity Assessment Upper Extremity Assessment: Overall WFL for  tasks assessed    Lower Extremity Assessment Lower Extremity Assessment: Generalized weakness    Cervical / Trunk Assessment Cervical / Trunk Assessment: Normal  Communication   Communication: No difficulties  Cognition Arousal/Alertness: Awake/alert Behavior During Therapy: WFL for tasks assessed/performed Overall Cognitive Status: Within Functional Limits for tasks assessed                                        General Comments General comments (skin integrity, edema, etc.):   01/02/17 1410  Vital Signs  Patient Position (if appropriate) Orthostatic Vitals  Orthostatic Lying   BP- Lying 145/67   Pulse- Lying 75  Orthostatic Sitting  BP- Sitting 141/65  Pulse- Sitting 83  Orthostatic Standing at 0 minutes  BP- Standing at 0 minutes 128/74  Pulse- Standing at 0 minutes 75  Orthostatic Standing at 3 minutes  BP- Standing at 3 minutes 126/53  Pulse- Standing at 3 minutes 84 (no reports of dizziness)       Exercises     Assessment/Plan    PT Assessment Patient needs continued PT services  PT Problem List Decreased strength;Decreased activity tolerance;Decreased balance;Decreased mobility;Decreased coordination;Decreased knowledge of use of DME;Cardiopulmonary status limiting activity       PT Treatment Interventions DME instruction;Stair training;Gait training;Functional mobility training;Therapeutic activities;Therapeutic exercise;Balance training;Neuromuscular re-education;Cognitive remediation;Patient/family education    PT Goals (Current goals can be found in the Care Plan section)  Acute Rehab PT Goals Patient Stated Goal: Hopes to be home tomorrow PT Goal Formulation: With patient Time For Goal Achievement: 01/09/17 Potential to Achieve Goals: Good    Frequency Min 3X/week   Barriers to discharge        Co-evaluation               AM-PAC PT "6 Clicks" Daily Activity  Outcome Measure Difficulty turning over in bed (including adjusting bedclothes, sheets and blankets)?: None Difficulty moving from lying on back to sitting on the side of the bed? : None Difficulty sitting down on and standing up from a chair with arms (e.g., wheelchair, bedside commode, etc,.)?: A Little Help needed moving to and from a bed to chair (including a wheelchair)?: None Help needed walking in hospital room?: A Little Help needed climbing 3-5 steps with a railing? : A Little 6 Click Score: 21    End of Session   Activity Tolerance: Patient tolerated treatment well Patient left: in bed;with call bell/phone within reach Nurse Communication: Mobility status PT Visit  Diagnosis: Muscle weakness (generalized) (M62.81);History of falling (Z91.81)    Time: 4944-9675 PT Time Calculation (min) (ACUTE ONLY): 27 min   Charges:   PT Evaluation $PT Eval Moderate Complexity: 1 Mod PT Treatments $Therapeutic Activity: 8-22 mins   PT G Codes:   PT G-Codes **NOT FOR INPATIENT CLASS** Functional Assessment Tool Used: Clinical judgement Functional Limitation: Mobility: Walking and moving around Mobility: Walking and Moving Around Current Status (F1638): At least 1 percent but less than 20 percent impaired, limited or restricted Mobility: Walking and Moving Around Goal Status [phone]): 0 percent impaired, limited or restricted    Roney Marion, PT  [place] Pager [phone] Office [place] 01/02/2017, 2:29 PM

## 2017-01-02 NOTE — Progress Notes (Signed)
PROGRESS NOTE   [name]  JAS:[id number]    DOB: 08/21/1923    DOA: 01/01/2017  PCP: Lavone Orn, MD   I have briefly reviewed patients previous medical records in [facility].  Brief Narrative:  83 year old single male, lives alone, mostly independent of activities of daily living but lately uses a cane for ambulation, PMH of CAD status post stent July 2012 with chronic angina, HTN, HLD, DM 2, PAF, bilateral mild carotid disease, recurrent colon cancer, stage IV rectal cancer/adenocarcinoma with suspected liver metastasis for which she is supposed to start palliative radiation and Xeloda, mild chronic intermittent rectal bleeding related to rectal cancer, presented to the ED on 01/01/17 after he passed out at a grocery store on the day of admission.He gives several weeks history of progressive weakness, intermittent dizziness and lightheadedness, exertional fatigue and dyspnea without chest pains. On site, EMS noted bradycardia in the 20s, SBP in the 90s, bolused with NS and received a dose of atropine in the ED with improvement in heart rate and blood pressures and clinically. Admitted to stepdown unit for evaluation and management of syncope, likely from symptomatic bradycardia, hypotension, rule out arrhythmias. Cardiology was consulted on 8/26.   Assessment & Plan:   Principal Problem:   Syncope and collapse Active Problems:   Hypertension   Hyperlipidemia   Coronary artery disease involving native heart without angina pectoris   Paroxysmal atrial fibrillation (HCC)   BRBPR (bright red blood per rectum)   Thrombocytopenia (HCC)   Non-insulin dependent type 2 diabetes mellitus (HCC)   Anemia, unspecified   Malnutrition of moderate degree   Chronic diastolic heart failure (HCC)   Rectal cancer (HCC)   Iron deficiency anemia due to chronic blood loss   Bradycardia    1. Syncope with collapse: Likely multifactorial with main factors being symptomatic bradycardia and  hypotension and anemia may be contributing. Responded well to normal saline bolus and a dose of atropine in the ED. Arrhythmias is a possibility since frequent PVCs, bi and trigeminy and episodes of NSVT noted on telemetry. Patient also has moderate aortic stenosis. 2-D echo 10/15/16 showed LVEF 50-55 percent, grade 1 diastolic dysfunction and moderate aortic stenosis. Carotid Dopplers 11/04/15: Bilateral 1-39 percent ICA stenosis. Admitted to stepdown unit for close monitoring. Keep on telemetry. Temporarily held amlodipine, carvedilol, Imdur over night of admission. Received brief and gentle IV fluids. Check orthostatic blood pressures-discussed with RN and pending. 2. Symptomatic bradycardia: Possibly due to beta blockers. Status post atropine 0.4 mg 1 in ED. Resolved. Temporarily held carvedilol over night of admission. Cardiology was consulted on 8/26 for assistance with evaluation and management. Await their input regarding resuming carvedilol at may be reduced dose and regarding resuming amlodipine and Imdur. 3. Hypotension: Multifactorial secondary to dehydration and medications. Management as above. Hypotension resolved.  4. Dehydration: Brief IV fluids. Improved. Encourage oral intake. DC IV fluids.  5. Macrocytic anemia: Baseline hemoglobin in the low 11 g range. Hemoglobin on 12/28/16 was 11.2. Initial hemoglobin in ED was 8.1 and there was some concern regarding significant drop of approximately 81 g from 4 days prior. Patient denies any increase in minimal, intermittent rectal bleeding where he notices blood on toilet paper. Repeat hemoglobin close to his baseline range, 10.8 > 9.9. Continue to monitor CBCs.  6. Thrombocytopenia:  single lab in ED showed platelet count of 135. Wonder if there was some discrepancy in that lab draw because both his hemoglobin and platelets were significantly abnormal compared to baseline without  a clear explanation. Platelets now normal.  7. Essential hypertension:  Presented with hypotension. Management as above. 8. Hyperlipidemia: Continue statins. 9. CAD status post PCI and chronic chest pain: No recent chest pain reported. Temporarily held Imdur secondary to hypotension. Continue Ranexa. No obvious acute changes on EKG. Minimally elevated troponin 0.03, flat trend .  10. Type II DM: Hold oral hypoglycemics. SSI.good inpatient control.  11. Rectal cancer with chronic rectal bleeding: Office Oncology input appreciated and supposed to start palliative radiation and Xeloda next week. Alerted Oncologist by adding to care team. 12. Chronic diastolic CHF: Not on diuretics at home. Euvolemic clinically.  13. Moderate malnutrition: Likely multifactorial due to significant comorbidities and rectal cancer. 14. PAF: Only on rate control medications. Not on anticoagulation at home probably related to his rectal bleeding. Failed attempt at [place] in March due to inability to pass the probe into the esophagus.  15.  PVCs/NSVT: Frequent PVCs on monitor and a couple of episodes of NSVT since admission. Magnesium replaced and now 2. Replace potassium to >4. Continue telemetry. Await cardiology input and likely to start carvedilol  16.  Inadvertent/Accidental Shock: On morning of 8/26, RN informed me that while testing patient's Zoll pads, he was accidentally defibrillated with 15 J. Patient obviously felt uncomfortable but remained stable. RN has discussed & apologized to patient. I discussed with him as well. 17. Insomnia. When necessary Ambien 18. Chronic interstitial lung disease: Reported on chest x-ray. Not on home oxygen.   DVT prophylaxis: SCDs Code Status: Full Family Communication: None at bedside Disposition: DC home when medically improved. Mobilize today, PT evaluation, resume some or all of his home medications and monitor overnight.   Consultants:  Cardiology   Procedures:  None  Antimicrobials:  None    Subjective: Seen this morning. Advised by  patient's night RN that early this morning when she was handing over to a.m. nurse, while checking the Zoll pad placed on patient's chest on admission, he was accidentally shocked with 15J. Patient felt uncomfortable but remained stable. He reports not sleeping well at all last night due to disturbance. He reports taking a sleeping pill and suspects that it is Ambien at home at night. No dizziness, lightheadedness, chest pain or palpitations. Has not been out of bed.  ROS: As above.  Objective:  Vitals:   01/01/17 2333 01/02/17 0317 01/02/17 0527 01/02/17 0745  BP: (!) 131/48 (!) 119/49  (!) 146/68  Pulse: 66 74  77  Resp: 16 19  13   Temp: 98.3 F (36.8 C) 98 F (36.7 C)  98.5 F (36.9 C)  TempSrc: Oral Oral  Oral  SpO2: 94% 94%  96%  Weight:   62.4 kg (137 lb 9.6 oz)   Height:        Examination:  General exam: Pleasant elderly male, moderately built, poorly nourished, frail, lying comfortably supine in bed. Oral mucosa moist. Respiratory system: Course chronic appearing bibasal Velcro-like crackles. Rest of lung fields clear to auscultation without wheezing or rhonchi or crackles. Respiratory effort normal. Cardiovascular system: S1 & S2 heard, RRR. No JVD, murmurs, rubs, gallops or clicks. No pedal edema. Telemetry: Sinus rhythm in the 70s. Frequent PVCs, some bigemini, trigeminal and an episode of NSVT of about 10 beats since admission.  Gastrointestinal system: Abdomen is nondistended, soft and nontender. No organomegaly or masses felt. Normal bowel sounds heard. Central nervous system: Alert and oriented. No focal neurological deficits. Extremities: Symmetric 5 x 5 power. Skin: No rashes, lesions or ulcers Psychiatry:  Judgement and insight appear normal. Mood & affect appropriate.     Data Reviewed: I have personally reviewed following labs and imaging studies  CBC:  Recent Labs Lab 12/28/16 1549 01/01/17 1600 01/01/17 1634 01/01/17 2227 01/02/17 0645  WBC 7.0 6.0   --  7.7 6.9  NEUTROABS 5.0 4.5  --   --   --   HGB 11.2* 8.1* 8.8* 10.8* 9.9*  HCT 33.7* 23.7* 26.0* 31.5* 29.7*  MCV 106.3* 103.0*  --  104.0* 103.1*  PLT 182 135*  --  192 161   Basic Metabolic Panel:  Recent Labs Lab 12/28/16 1549 01/01/17 1600 01/01/17 1634 01/02/17 0645  NA 135* 136 139 136  K 4.6 4.0 3.8 3.7  CL  --  107 106 105  CO2 24 24  --  24  GLUCOSE 188* 146* 134* 108*  BUN 20.2 16 16 11   CREATININE 1.2 1.02 0.90 0.89  CALCIUM 9.3 8.0*  --  8.3*  MG  --  1.7  --  2.0   Liver Function Tests:  Recent Labs Lab 12/28/16 1549 01/01/17 1600  AST 23 23  ALT 20 19  ALKPHOS 109 79  BILITOT 0.71 0.7  PROT 7.2 5.9*  ALBUMIN 3.3* 2.5*   Coagulation Profile: No results for input(s): INR, PROTIME in the last 168 hours. Cardiac Enzymes:  Recent Labs Lab 01/01/17 1600 01/01/17 2227 01/02/17 0152 01/02/17 0645  TROPONINI 0.03* 0.03* 0.03* 0.03*   HbA1C: No results for input(s): HGBA1C in the last 72 hours. CBG:  Recent Labs Lab 01/01/17 2158 01/02/17 0805  GLUCAP 113* 94    Recent Results (from the past 240 hour(s))  MRSA PCR Screening     Status: None   Collection Time: 01/02/17 12:10 AM  Result Value Ref Range Status   MRSA by PCR NEGATIVE NEGATIVE Final    Comment:        The GeneXpert MRSA Assay (FDA approved for NASAL specimens only), is one component of a comprehensive MRSA colonization surveillance program. It is not intended to diagnose MRSA infection nor to guide or monitor treatment for MRSA infections.          Radiology Studies: Dg Chest 2 View  Result Date: 01/01/2017 CLINICAL DATA:  Bradycardia EXAM: CHEST  2 VIEW COMPARISON:  10/06/2016 FINDINGS: Severe chronic interstitial lung disease again noted. Heart is normal size. No effusions. No acute bony abnormality. No definite acute process. IMPRESSION: Severe chronic interstitial lung disease. No definite acute process. Electronically Signed   By: Rolm Baptise M.D.   On:  01/01/2017 17:43        Scheduled Meds: . atorvastatin  40 mg Oral q1800  . B-complex with vitamin C  1 tablet Oral Daily  . donepezil  10 mg Oral QHS  . feeding supplement (ENSURE ENLIVE)  237 mL Oral BID BM  . insulin aspart  0-9 Units Subcutaneous TID WC  . potassium chloride  40 mEq Oral Once  . ranolazine  1,000 mg Oral BID  . sodium chloride flush  3 mL Intravenous Q12H   Continuous Infusions:   LOS: 0 days     Veretta Sabourin, MD, FACP, FHM. [facility] Pager [phone]  If 7PM-7AM, please contact night-coverage [email or web address] Password [facility] 01/02/2017, 10:39 AM

## 2017-01-03 ENCOUNTER — Ambulatory Visit: Payer: Medicare Other | Admitting: Radiation Oncology

## 2017-01-03 ENCOUNTER — Observation Stay (HOSPITAL_BASED_OUTPATIENT_CLINIC_OR_DEPARTMENT_OTHER): Payer: Medicare Other

## 2017-01-03 DIAGNOSIS — I472 Ventricular tachycardia: Secondary | ICD-10-CM | POA: Diagnosis not present

## 2017-01-03 DIAGNOSIS — R55 Syncope and collapse: Secondary | ICD-10-CM | POA: Diagnosis not present

## 2017-01-03 DIAGNOSIS — I5030 Unspecified diastolic (congestive) heart failure: Secondary | ICD-10-CM

## 2017-01-03 DIAGNOSIS — D649 Anemia, unspecified: Secondary | ICD-10-CM | POA: Diagnosis not present

## 2017-01-03 LAB — BASIC METABOLIC PANEL
ANION GAP: 8 (ref 5–15)
BUN: 14 mg/dL (ref 6–20)
CHLORIDE: 105 mmol/L (ref 101–111)
CO2: 22 mmol/L (ref 22–32)
Calcium: 8.5 mg/dL — ABNORMAL LOW (ref 8.9–10.3)
Creatinine, Ser: 0.84 mg/dL (ref 0.61–1.24)
GFR calc non Af Amer: >60 mL/min (ref >60)
Glucose, Bld: 123 mg/dL — ABNORMAL HIGH (ref 65–99)
POTASSIUM: 3.9 mmol/L (ref 3.5–5.1)
SODIUM: 135 mmol/L (ref 135–145)

## 2017-01-03 LAB — GLUCOSE, CAPILLARY
GLUCOSE-CAPILLARY: 128 mg/dL — AB (ref 65–99)
Glucose-Capillary: 231 mg/dL — ABNORMAL HIGH (ref 65–99)
Glucose-Capillary: 214 mg/dL — ABNORMAL HIGH (ref 65–99)
Glucose-Capillary: 107 mg/dL — ABNORMAL HIGH (ref 65–99)

## 2017-01-03 LAB — CBC
HEMATOCRIT: 31.5 % — AB (ref 39.0–52.0)
Hemoglobin: 10.8 g/dL — ABNORMAL LOW (ref 13.0–17.0)
MCH: 35.3 pg — ABNORMAL HIGH (ref 26.0–34.0)
MCHC: 34.3 g/dL (ref 30.0–36.0)
MCV: 102.9 fL — AB (ref 78.0–100.0)
Platelets: 189 10*3/uL (ref 150–400)
RBC: 3.06 MIL/uL — ABNORMAL LOW (ref 4.22–5.81)
RDW: 13.4 % (ref 11.5–15.5)
WBC: 8.4 10*3/uL (ref 4.0–10.5)

## 2017-01-03 LAB — ECHOCARDIOGRAM COMPLETE
Height: 69 in
Weight: 2196.8 oz

## 2017-01-03 MED ORDER — ASPIRIN EC 81 MG PO TBEC
81.0000 mg | DELAYED_RELEASE_TABLET | Freq: Every day | ORAL | Status: DC
  Administered 2017-01-03 – 2017-01-04 (×2): 81 mg via ORAL
  Filled 2017-01-03 (×2): qty 1

## 2017-01-03 MED ORDER — CARVEDILOL 3.125 MG PO TABS
3.1250 mg | ORAL_TABLET | Freq: Two times a day (BID) | ORAL | Status: DC
  Administered 2017-01-03 – 2017-01-04 (×3): 3.125 mg via ORAL
  Filled 2017-01-03 (×3): qty 1

## 2017-01-03 MED ORDER — ISOSORBIDE MONONITRATE ER 30 MG PO TB24
30.0000 mg | ORAL_TABLET | Freq: Every day | ORAL | Status: DC
Start: 2017-01-03 — End: 2017-01-04
  Administered 2017-01-03 – 2017-01-04 (×2): 30 mg via ORAL
  Filled 2017-01-03 (×2): qty 1

## 2017-01-03 MED ORDER — AMLODIPINE BESYLATE 2.5 MG PO TABS
2.5000 mg | ORAL_TABLET | Freq: Every day | ORAL | Status: DC
  Administered 2017-01-03 – 2017-01-04 (×2): 2.5 mg via ORAL
  Filled 2017-01-03 (×2): qty 1

## 2017-01-03 NOTE — Progress Notes (Signed)
  Echocardiogram 2D Echocardiogram has been performed.  Bobbye [place] 01/03/2017, 9:27 AM

## 2017-01-03 NOTE — Progress Notes (Signed)
Physical Therapy Treatment Patient Details Name: [name] MRN: [id number] DOB: 1924-03-02 Today's Date: 01/03/2017    History of Present Illness [name] is a 83 year old single male, lives alone, mostly independent of activities of daily living but lately uses a cane for ambulation, PMH of CAD status post stent July 2012 with chronic angina, HTN, HLD, DM 2, PAF, bilateral mild carotid disease, recurrent colon cancer, stage IV rectal cancer/adenocarcinoma with suspected liver metastasis for which she is supposed to start palliative radiation and Xeloda, mild chronic intermittent rectal bleeding related to rectal cancer, presented to the ED on 01/01/17 after he passed out at a grocery store on the day of admission. Patient and his sister at bedside provided history. Patient reports progressive weakness of several weeks duration, intermittent dizziness and lightheadedness, exertional fatigue and dyspnea but no recent chest pains. He was in his usual state of health this morning and wanted to go to the grocery store although his sister did not think that was a good idea due to his weakness. He did well at the grocery store and while paying at the cash counter, he felt dizzy, lightheaded and passed out. Bystanders were able to hold him and he did not fall to the ground. He was helped to a chair. He may have passed out for a couple of minutes and then regained consciousness. As per my discussion with EDP, when EMS got there, his heart rate on the monitor was in the 40s but clinically it was 28 bpm and he had SBP in the 90s. He was bolused with IV normal saline and his blood pressures improved. Currently patient states that he feels better and back to his usual.     PT Comments    Pt ambulated 300' with SPC and supervision, occasional drifting but no LOB. Pt was quite worked up over change in testing/ treatment schedule today so deferred exercises to next session. PT will continue to follow.   Follow Up Recommendations  Supervision - Intermittent;Outpatient PT (outpt PT can be set up later by PCP when pt requests)     Equipment Recommendations  None recommended by PT    Recommendations for Other Services       Precautions / Restrictions Precautions Precautions: None Restrictions Weight Bearing Restrictions: No    Mobility  Bed Mobility Overal bed mobility: Independent                Transfers Overall transfer level: Modified independent Equipment used: Straight cane             General transfer comment: pt stands quickly EOB with support of cane  Ambulation/Gait Ambulation/Gait assistance: Supervision Ambulation Distance (Feet): 300 Feet Assistive device: Straight cane Gait Pattern/deviations: Step-through pattern;Drifts right/left Gait velocity: WFL Gait velocity interpretation: at or above normal speed for age/gender General Gait Details: sometimes places cane to floor, other times holds it above floor. No LOB, appropriate pace   Stairs            Wheelchair Mobility    Modified Rankin (Stroke Patients Only)       Balance Overall balance assessment: No apparent balance deficits (not formally assessed)                                          Cognition Arousal/Alertness: Awake/alert Behavior During Therapy: WFL for tasks assessed/performed;Agitated Overall Cognitive Status: Impaired/Different from baseline Area  of Impairment: Memory                     Memory: Decreased short-term memory         General Comments: pt very agitated that he has stress test today and radiation has been moved to tomorrow. He keeps asking his nephew about the schedule and why he can't go home. Demonstrates some mild STM deficits likely due to age/ change in environment.       Exercises      General Comments General comments (skin integrity, edema, etc.): BP supine 152/62 sitting 124/78  standing 141/74. HR with  ambulation 88bpm      Pertinent Vitals/Pain Pain Assessment: No/denies pain    Home Living                      Prior Function            PT Goals (current goals can now be found in the care plan section) Acute Rehab PT Goals Patient Stated Goal: go home ASAP PT Goal Formulation: With patient Time For Goal Achievement: 01/09/17 Potential to Achieve Goals: Good Progress towards PT goals: Progressing toward goals    Frequency    Min 3X/week      PT Plan Current plan remains appropriate    Co-evaluation              AM-PAC PT "6 Clicks" Daily Activity  Outcome Measure  Difficulty turning over in bed (including adjusting bedclothes, sheets and blankets)?: None Difficulty moving from lying on back to sitting on the side of the bed? : None Difficulty sitting down on and standing up from a chair with arms (e.g., wheelchair, bedside commode, etc,.)?: None Help needed moving to and from a bed to chair (including a wheelchair)?: None Help needed walking in hospital room?: A Little Help needed climbing 3-5 steps with a railing? : A Little 6 Click Score: 22    End of Session Equipment Utilized During Treatment: Gait belt Activity Tolerance: Patient tolerated treatment well Patient left: in bed;with call bell/phone within reach;with family/visitor present Nurse Communication: Mobility status PT Visit Diagnosis: Muscle weakness (generalized) (M62.81);History of falling (Z91.81)     Time: 5790-3833 PT Time Calculation (min) (ACUTE ONLY): 21 min  Charges:  $Gait Training: 8-22 mins                    G Codes:       Leighton Roach, PT  Acute Rehab Services  [place] 01/03/2017, 9:30 AM

## 2017-01-03 NOTE — Progress Notes (Signed)
PROGRESS NOTE   [name]  WCH:[id number]    DOB: 12-22-1923    DOA: 01/01/2017  PCP: Lavone Orn, MD   I have briefly reviewed patients previous medical records in [facility].  Brief Narrative:  83 year old single male, lives alone, mostly independent of activities of daily living but lately uses a cane for ambulation, PMH of CAD status post stent July 2012 with chronic angina, HTN, HLD, DM 2, PAF, bilateral mild carotid disease, recurrent colon cancer, stage IV rectal cancer/adenocarcinoma with suspected liver metastasis for which she is supposed to start palliative radiation and Xeloda, mild chronic intermittent rectal bleeding related to rectal cancer, presented to the ED on 01/01/17 after he passed out at a grocery store on the day of admission.He gives several weeks history of progressive weakness, intermittent dizziness and lightheadedness, exertional fatigue and dyspnea without chest pains. On site, EMS noted bradycardia in the 20s, SBP in the 90s, bolused with NS and received a dose of atropine in the ED with improvement in heart rate and blood pressures and clinically. Admitted to stepdown unit for evaluation and management of syncope, likely from symptomatic bradycardia, hypotension, rule out arrhythmias. Cardiology was consulted on 8/26.   Assessment & Plan:   Principal Problem:   Syncope and collapse Active Problems:   Hypertension   Hyperlipidemia   Coronary artery disease involving native heart without angina pectoris   Paroxysmal atrial fibrillation (HCC)   BRBPR (bright red blood per rectum)   Thrombocytopenia (HCC)   Non-insulin dependent type 2 diabetes mellitus (HCC)   Anemia, unspecified   Malnutrition of moderate degree   Chronic diastolic heart failure (HCC)   Rectal cancer (HCC)   Iron deficiency anemia due to chronic blood loss   Bradycardia    1. Syncope with collapse: Likely multifactorial with main factors being symptomatic bradycardia and  hypotension and anemia may be contributing. Responded well to normal saline bolus and a dose of atropine in the ED. Arrhythmias is a possibility since frequent PVCs, bi and trigeminy and episodes of NSVT noted on telemetry. Patient also has moderate aortic stenosis. 2-D echo 10/15/16 showed LVEF 50-55 percent, grade 1 diastolic dysfunction and moderate aortic stenosis. Carotid Dopplers 11/04/15: Bilateral 1-39 percent ICA stenosis. Admitted to stepdown unit for close monitoring. Keep on telemetry. Temporarily held amlodipine, carvedilol, Imdur and will be resumed at reduced doses. Received brief and gentle IV fluids. Cardiology follow-up appreciated and plan to resume carvedilol 3.125 MG twice a day, follow heart rate on telemetry, repeat 2-D echo and if LV function remains normal would plan outpatient event monitor. Patient not a candidate for ICD due to advanced age and metastatic cancer. Patient has been counseled that he should not drive for 6 months following syncope and he verbalized understanding. 2. Symptomatic bradycardia: Possibly due to beta blockers. Status post atropine 0.4 mg 1 in ED. Resolved. Temporarily held carvedilol over night of admission. Bradycardia resolved. As per cardiology follow-up, carvedilol being resumed at low dose 3.125 MG twice a day. Continue monitoring on telemetry. 3. Hypotension: Multifactorial secondary to dehydration and medications. Management as above. Hypotension resolved. Not orthostatic as per blood pressures today. Resuming his cardiac medications at low doses, monitor. 4. Dehydration: Brief IV fluids. Improved. Encourage oral intake. DC IV fluids.  5. Macrocytic anemia: Baseline hemoglobin in the low 11 g range. Hemoglobin on 12/28/16 was 11.2. Initial hemoglobin in ED was 8.1 and there was some concern regarding significant drop of approximately 3 g from 4 days prior. Patient  denies any increase in minimal, intermittent rectal bleeding where he notices blood on toilet  paper. Repeat hemoglobin close to his baseline range, 10.8 > 9.9 >10.8. Follow CBCs as outpatient. 6. Thrombocytopenia:  single lab in ED showed platelet count of 135. Wonder if there was some discrepancy in that lab draw because both his hemoglobin and platelets were significantly abnormal compared to baseline without a clear explanation. Platelets now normal.  7. Essential hypertension: Presented with hypotension. Management as above. 8. Hyperlipidemia: Continue statins. 9. CAD status post PCI and chronic chest pain: No recent chest pain reported. Temporarily held Imdur secondary to hypotension. Continue Ranexa. No obvious acute changes on EKG. Minimally elevated troponin 0.03, flat trend . As per cardiology, minimally elevated troponin not consistent with ACS and no ischemic evaluation being pursued at this point. Outpatient follow-up with primary cardiologist. 10. Type II DM: Hold oral hypoglycemics. SSI. Good inpatient control.  11. Rectal cancer with chronic rectal bleeding: Office Oncology input appreciated and supposed to start palliative radiation and Xeloda next week. I discussed with patient's primary oncologist on 8/27 and advised that he would not be able to make it to his radiation treatment appointment today and this will need to be postponed. 12. Chronic diastolic CHF: Not on diuretics at home. Euvolemic clinically.  13. Moderate malnutrition: Likely multifactorial due to significant comorbidities and rectal cancer. 14. PAF: Only on rate control medications. Not on anticoagulation at home probably related to his rectal bleeding. Failed attempt at [place] in March due to inability to pass the probe into the esophagus. Outpatient follow-up with primary cardiologist for further discussion regarding anticoagulation. 15.  PVCs/NSVT: Frequent PVCs on monitor and a couple of episodes of NSVT since admission. Magnesium replaced and now 2. Replace potassium to >4. Continue telemetry. Management as  above. 16.  Inadvertent/Accidental Shock: On morning of 8/26, RN informed me that while testing patient's Zoll pads, he was accidentally defibrillated with 15 J. Patient obviously felt uncomfortable but remained stable. RN has discussed & apologized to patient. I discussed with him as well. 17. Insomnia. May consider Ambien as needed but patient apparently slept well last night without any medications. 18. Chronic interstitial lung disease: Reported on chest x-ray. Not on home oxygen.   DVT prophylaxis: SCDs Code Status: Full Family Communication: None at bedside Disposition: DC home when medically improved. DC home possibly 8/28 with outpatient PT  Consultants:  Cardiology   Procedures:  None  Antimicrobials:  None    Subjective: Denies complaints. States that he ambulated with PT yesterday without dizziness, lightheadedness or feeling like passing out. No dyspnea, chest pain or palpitations reported. No bleeding reported since hospital admission.  ROS: As above.  Objective:  Vitals:   01/02/17 1939 01/02/17 2355 01/03/17 0321 01/03/17 0803  BP:  136/79 (!) 152/75 (!) 152/62  Pulse:  62  76  Resp:  16 (!) 24   Temp:  99.1 F (37.3 C) 98.3 F (36.8 C) 98 F (36.7 C)  TempSrc:  Oral Oral Oral  SpO2: 96% 96% 97% 100%  Weight:   62.3 kg (137 lb 4.8 oz)   Height:        Examination:  General exam: Pleasant elderly male, moderately built, poorly nourished, frail, lying comfortably supine in bed. Oral mucosa moist.Stable. Respiratory system: Course chronic appearing bibasal Velcro-like crackles. Rest of lung fields clear to auscultation without wheezing or rhonchi or crackles. Respiratory effort normal.Stable. Cardiovascular system: S1 & S2 heard, RRR. No JVD, murmurs, rubs, gallops or clicks.  No pedal edema. Telemetry: Sinus rhythm with frequent PVCs and occasional NSVT. Gastrointestinal system: Abdomen is nondistended, soft and nontender. No organomegaly or masses felt.  Normal bowel sounds heard. Stable. Central nervous system: Alert and oriented. No focal neurological deficits. Stable. Extremities: Symmetric 5 x 5 power. Skin: No rashes, lesions or ulcers Psychiatry: Judgement and insight appear normal. Mood & affect appropriate.     Data Reviewed: I have personally reviewed following labs and imaging studies  CBC:  Recent Labs Lab 12/28/16 1549 01/01/17 1600 01/01/17 1634 01/01/17 2227 01/02/17 0645 01/03/17 0151  WBC 7.0 6.0  --  7.7 6.9 8.4  NEUTROABS 5.0 4.5  --   --   --   --   HGB 11.2* 8.1* 8.8* 10.8* 9.9* 10.8*  HCT 33.7* 23.7* 26.0* 31.5* 29.7* 31.5*  MCV 106.3* 103.0*  --  104.0* 103.1* 102.9*  PLT 182 135*  --  192 172 353   Basic Metabolic Panel:  Recent Labs Lab 12/28/16 1549 01/01/17 1600 01/01/17 1634 01/02/17 0645 01/03/17 0151  NA 135* 136 139 136 135  K 4.6 4.0 3.8 3.7 3.9  CL  --  107 106 105 105  CO2 24 24  --  24 22  GLUCOSE 188* 146* 134* 108* 123*  BUN 20.2 16 16 11 14   CREATININE 1.2 1.02 0.90 0.89 0.84  CALCIUM 9.3 8.0*  --  8.3* 8.5*  MG  --  1.7  --  2.0  --    Liver Function Tests:  Recent Labs Lab 12/28/16 1549 01/01/17 1600  AST 23 23  ALT 20 19  ALKPHOS 109 79  BILITOT 0.71 0.7  PROT 7.2 5.9*  ALBUMIN 3.3* 2.5*   Coagulation Profile: No results for input(s): INR, PROTIME in the last 168 hours. Cardiac Enzymes:  Recent Labs Lab 01/01/17 1600 01/01/17 2227 01/02/17 0152 01/02/17 0645  TROPONINI 0.03* 0.03* 0.03* 0.03*   HbA1C: No results for input(s): HGBA1C in the last 72 hours. CBG:  Recent Labs Lab 01/02/17 0805 01/02/17 1253 01/02/17 1622 01/02/17 2107 01/03/17 0758  GLUCAP 94 214* 147* 186* 128*    Recent Results (from the past 240 hour(s))  MRSA PCR Screening     Status: None   Collection Time: 01/02/17 12:10 AM  Result Value Ref Range Status   MRSA by PCR NEGATIVE NEGATIVE Final    Comment:        The GeneXpert MRSA Assay (FDA approved for NASAL  specimens only), is one component of a comprehensive MRSA colonization surveillance program. It is not intended to diagnose MRSA infection nor to guide or monitor treatment for MRSA infections.          Radiology Studies: Dg Chest 2 View  Result Date: 01/01/2017 CLINICAL DATA:  Bradycardia EXAM: CHEST  2 VIEW COMPARISON:  10/06/2016 FINDINGS: Severe chronic interstitial lung disease again noted. Heart is normal size. No effusions. No acute bony abnormality. No definite acute process. IMPRESSION: Severe chronic interstitial lung disease. No definite acute process. Electronically Signed   By: Rolm Baptise M.D.   On: 01/01/2017 17:43        Scheduled Meds: . amLODipine  2.5 mg Oral Daily  . aspirin EC  81 mg Oral Daily  . atorvastatin  40 mg Oral q1800  . B-complex with vitamin C  1 tablet Oral Daily  . donepezil  10 mg Oral QHS  . feeding supplement (ENSURE ENLIVE)  237 mL Oral BID BM  . insulin aspart  0-9 Units Subcutaneous TID  WC  . isosorbide mononitrate  30 mg Oral Daily  . ranolazine  1,000 mg Oral BID  . sodium chloride flush  3 mL Intravenous Q12H   Continuous Infusions:   LOS: 0 days     Curry Seefeldt, MD, FACP, FHM. [facility] Pager [phone]  If 7PM-7AM, please contact night-coverage [email or web address] Password [facility] 01/03/2017, 11:49 AM

## 2017-01-03 NOTE — Progress Notes (Signed)
Progress Note  Patient Name: [name] Date of Encounter: 01/03/2017  Primary Cardiologist: Dr [name]  Subjective   Denies CP or dyspnea  Inpatient Medications    Scheduled Meds: . atorvastatin  40 mg Oral q1800  . B-complex with vitamin C  1 tablet Oral Daily  . donepezil  10 mg Oral QHS  . feeding supplement (ENSURE ENLIVE)  237 mL Oral BID BM  . insulin aspart  0-9 Units Subcutaneous TID WC  . ranolazine  1,000 mg Oral BID  . sodium chloride flush  3 mL Intravenous Q12H   Continuous Infusions:  PRN Meds: acetaminophen, albuterol, nitroGLYCERIN, polyvinyl alcohol   Vital Signs    Vitals:   01/02/17 1939 01/02/17 2355 01/03/17 0321 01/03/17 0803  BP:  136/79 (!) 152/75 (!) 152/62  Pulse:  62  76  Resp:  16 (!) 24   Temp:  99.1 F (37.3 C) 98.3 F (36.8 C) 98 F (36.7 C)  TempSrc:  Oral Oral Oral  SpO2: 96% 96% 97% 100%  Weight:   62.3 kg (137 lb 4.8 oz)   Height:        Intake/Output Summary (Last 24 hours) at 01/03/17 0844 Last data filed at 01/02/17 2200  Gross per 24 hour  Intake              994 ml  Output              300 ml  Net              694 ml   Filed Weights   01/01/17 1557 01/02/17 0527 01/03/17 0321  Weight: 63.5 kg (140 lb) 62.4 kg (137 lb 9.6 oz) 62.3 kg (137 lb 4.8 oz)    Telemetry    Sinus with PVCs and NSVT- Personally Reviewed   Physical Exam   GEN: No acute distress.   Neck: No JVD Cardiac: RRR, 2/6 systolic murmur Respiratory: Clear to auscultation bilaterally. GI: Soft, nontender, non-distended  MS: No edema Neuro:  Nonfocal  Psych: Normal affect   Labs    Chemistry Recent Labs Lab 12/28/16 1549  01/01/17 1600 01/01/17 1634 01/02/17 0645 01/03/17 0151  NA 135*  < > 136 139 136 135  K 4.6  < > 4.0 3.8 3.7 3.9  CL  --   < > 107 106 105 105  CO2 24  --  24  --  24 22  GLUCOSE 188*  < > 146* 134* 108* 123*  BUN 20.2  < > 16 16 11 14   CREATININE 1.2  < > 1.02 0.90 0.89 0.84  CALCIUM 9.3  --  8.0*   --  8.3* 8.5*  PROT 7.2  --  5.9*  --   --   --   ALBUMIN 3.3*  --  2.5*  --   --   --   AST 23  --  23  --   --   --   ALT 20  --  19  --   --   --   ALKPHOS 109  --  79  --   --   --   BILITOT 0.71  --  0.7  --   --   --   GFRNONAA  --   --  >60  --  >60 >60  GFRAA  --   --  >60  --  >60 >60  ANIONGAP 5  --  5  --  7 8  < > =  values in this interval not displayed.   Hematology Recent Labs Lab 01/01/17 2227 01/02/17 0645 01/03/17 0151  WBC 7.7 6.9 8.4  RBC 3.03* 2.88* 3.06*  HGB 10.8* 9.9* 10.8*  HCT 31.5* 29.7* 31.5*  MCV 104.0* 103.1* 102.9*  MCH 35.6* 34.4* 35.3*  MCHC 34.3 33.3 34.3  RDW 13.4 13.2 13.4  PLT 192 172 189    Cardiac Enzymes Recent Labs Lab 01/01/17 1600 01/01/17 2227 01/02/17 0152 01/02/17 0645  TROPONINI 0.03* 0.03* 0.03* 0.03*    Recent Labs Lab 01/01/17 1632  TROPIPOC 0.00     BNP Recent Labs Lab 01/01/17 1546  BNP 440.2*       Radiology    Dg Chest 2 View  Result Date: 01/01/2017 CLINICAL DATA:  Bradycardia EXAM: CHEST  2 VIEW COMPARISON:  10/06/2016 FINDINGS: Severe chronic interstitial lung disease again noted. Heart is normal size. No effusions. No acute bony abnormality. No definite acute process. IMPRESSION: Severe chronic interstitial lung disease. No definite acute process. Electronically Signed   By: Rolm Baptise M.D.   On: 01/01/2017 17:43    Patient Profile     83 y.o. male admitted with syncope.  Assessment & Plan    1 syncope-etiology unclear. ER note states heart rate in the 40s and clinically in the 20s (possibly related to not palpating ectopy) but I have no documentation by strips. I have reviewed his telemetry which shows sinus rhythm with frequent PVCs and occasional nonsustained ventricular tachycardia. I will resume carvedilol 3.125 mg twice a day and follow heart rate. Previous echocardiogram showed normal LV systolic function. We will await follow-up study. If LV function remains normal would plan  outpatient event monitor (DC in AM). Note patient has not had chest pain. I will ask him to follow-up with Dr. [name] 4 weeks to decide if nuclear study needed. I have explained that he should not drive for 6 months following syncope. Also note he would not be a candidate for ICD given age and metastatic cancer.  2 coronary artery disease-continue aspirin and statin at discharge.  3 hypertension-I will resume low-dose amlodipine and lower dose isosorbide. Follow blood pressure and adjust regimen as needed.  4 history of paroxysmal atrial fibrillation-CHADSvasc 5. Patient not on Coumadin at present. This may be related to recent metastatic cancer. He will follow-up in the office for further discussion. He remains in sinus rhythm.  5 moderate aortic stenosis-repeat echocardiogram pending.  6 minimally elevated troponin-not consistent with acute coronary syndrome. We'll not pursue further ischemic evaluation at this point. Note nuclear study April 2018 showed infarct but no ischemia and ejection fraction 48%.  7 metastatic cancer-oncology.  Signed, Kirk Ruths, MD  01/03/2017, 8:44 AM

## 2017-01-03 NOTE — Progress Notes (Signed)
Initial Nutrition Assessment  DOCUMENTATION CODES:   Severe malnutrition in context of chronic illness  INTERVENTION:   -Continue Ensure Enlive po BID, each supplement provides 350 kcal and 20 grams of protein  NUTRITION DIAGNOSIS:   Malnutrition (Severe) related to chronic illness (rectal cancer) as evidenced by percent weight loss, severe depletion of muscle mass.  GOAL:   Patient will meet greater than or equal to 90% of their needs  MONITOR:   PO intake, Supplement acceptance, Labs, Weight trends, Skin, I & O's  REASON FOR ASSESSMENT:   Malnutrition Screening Tool    ASSESSMENT:   [name] is a 83 year old single male, lives alone, mostly independent of activities of daily living but lately uses a cane for ambulation, PMH of CAD status post stent July 2012 with chronic angina, HTN, HLD, DM 2, PAF, bilateral mild carotid disease, recurrent colon cancer, stage IV rectal cancer/adenocarcinoma with suspected liver metastasis for which she is supposed to start palliative radiation and Xeloda, mild chronic intermittent rectal bleeding related to rectal cancer, presented to the ED on 01/01/17 after he passed out at a grocery store on the day of admission.  Pt admitted with syncope and collapse.   Spoke with pt and family members at bedside. They share that pt generally has a good appetite, however, declines around dinner time. Pt consumes a large breakfast and lunch meal daily and consumes a chocolate cookie and Boost supplement at night. Family reports that appetite decreased greatly around 6 months ago, however, intake has improved since they started checking in on him more often.   Appetite has been good during hospitalization; consuming 95-100% of meals. Pt reports he consumed a grilled cheese sandwich and soup for lunch.  Pt reports UBW of around 165#. He is unable to provide time frame for weight loss, other than "it was gradual, I thought it was because I was old". Wt  hx reveals a 9.2% wt loss over the past 3 months, which is significant for time frame. Pt is still very active, lives by himself, and does all of his ADLs.   Nutrition-Focused physical exam completed. Findings are mild to moderate fat depletion, moderate to severe muscle depletion, and no edema.   Discussed importance of good meal (especially protein) intake to promote healing. Discussed supplement options with pt; he enjoys Boost supplements, but amenable to Ensure Enlive due to increased nutrient density. Encouraged pt to consume nutrient dense supplement at home- discussed recommendations for appropriate supplements with both pt and family members.   Labs reviewed: CBGS: 128-231.   Diet Order:  Diet heart healthy/carb modified Room service appropriate? Yes; Fluid consistency: Thin  Skin:   (rectal incision)  Last BM:  01/02/17  Height:   Ht Readings from Last 1 Encounters:  01/01/17 5\' 9"  (1.753 m)    Weight:   Wt Readings from Last 1 Encounters:  01/03/17 137 lb 4.8 oz (62.3 kg)    Ideal Body Weight:  72.7 kg  BMI:  Body mass index is 20.28 kg/m.  Estimated Nutritional Needs:   Kcal:  1550-1750  Protein:  80-95 grams  Fluid:  > 1.5 L  EDUCATION NEEDS:   Education needs addressed  Gurtha Picker A. Jimmye Norman, RD, LDN, CDE Pager: [phone] After hours Pager: [phone]

## 2017-01-03 NOTE — Care Management Obs Status (Signed)
[facility] NOTIFICATION   Patient Details  Name: ERIQUE KASER MRN: [id number] Date of Birth: 08-Nov-1923   Medicare Observation Status Notification Given:  Yes    Maryclare Labrador, RN 01/03/2017, 2:14 PM

## 2017-01-03 NOTE — Care Management Note (Signed)
Case Management Note  Patient Details  Name: [name] MRN: [id number] Date of Birth: 06-14-1923  Subjective/Objective:   Pt admitted with syncope and collapse                 Action/Plan:  PTA from home independent, pt states he stays with his sister.  Pt declined for CM to arrange outpt PT as recommended - stated he was already in the process of arranging [facility] through his PCP.  Sister will transport pt home at discharge.  CM will continue to follow for discharge needs   Expected Discharge Date:  01/04/17               Expected Discharge Plan:  Home/Self Care  In-House Referral:     Discharge planning Services  CM Consult  Post Acute Care Choice:    Choice offered to:     DME Arranged:    DME Agency:     HH Arranged:    HH Agency:     Status of Service:     If discussed at [employer], dates discussed:    Additional Comments:  [name] Labrador, RN 01/03/2017, 2:18 PM

## 2017-01-04 ENCOUNTER — Telehealth: Payer: Self-pay | Admitting: *Deleted

## 2017-01-04 ENCOUNTER — Ambulatory Visit
Admission: RE | Admit: 2017-01-04 | Discharge: 2017-01-04 | Disposition: A | Discharge status: Home / Self Care | Payer: Medicare Other | Source: Ambulatory Visit | Attending: Radiation Oncology | Admitting: Radiation Oncology

## 2017-01-04 DIAGNOSIS — R55 Syncope and collapse: Secondary | ICD-10-CM | POA: Diagnosis not present

## 2017-01-04 DIAGNOSIS — E43 Unspecified severe protein-calorie malnutrition: Secondary | ICD-10-CM | POA: Insufficient documentation

## 2017-01-04 LAB — GLUCOSE, CAPILLARY: Glucose-Capillary: 120 mg/dL — ABNORMAL HIGH (ref 65–99)

## 2017-01-04 MED ORDER — RANOLAZINE ER 1000 MG PO TB12: 1000.0000 mg | ORAL_TABLET | Freq: Two times a day (BID) | ORAL | 0 refills | Status: DC

## 2017-01-04 MED ORDER — CARVEDILOL 3.125 MG PO TABS: 3.1250 mg | ORAL_TABLET | Freq: Two times a day (BID) | ORAL | 0 refills | Status: AC

## 2017-01-04 MED ORDER — ISOSORBIDE MONONITRATE ER 30 MG PO TB24: 30.0000 mg | ORAL_TABLET | Freq: Every day | ORAL | 0 refills | Status: DC

## 2017-01-04 NOTE — Telephone Encounter (Signed)
Called patient home, spoke with the patient, he  was d/c hospital today stated" he would be here today at 5pm for his radiation treatment," thanked RN for calling and checking on him 3:12 PM

## 2017-01-04 NOTE — Progress Notes (Addendum)
Chaplain presented the documentation for an Advance Directive to the patient. His sister states they have already completed the form.  Chaplain Yaakov Guthrie [phone]

## 2017-01-04 NOTE — Progress Notes (Signed)
Ambulated along the hallway with cane  Tolerated well. MD aware.

## 2017-01-04 NOTE — Progress Notes (Signed)
Discharged home by wheelchair, stable. Discharge instructions given to pt. Belongings taken home.

## 2017-01-04 NOTE — Discharge Summary (Signed)
Physician Discharge Summary  [name] EPP:[id number] DOB: 11/13/1923 DOA: 01/01/2017  PCP: Lavone Orn, MD  Admit date: 01/01/2017 Discharge date: 01/04/2017  Time spent: 35 minutes  Recommendations for Outpatient Follow-up:  1. Would recomm OP close f/o with Dr. Burr Medico and Dr Lisbeth Renshaw re: further cancer care 2. Have stopped multiple meds not consistent with EOL care 3. Would recommend Palliative input as well as integration as an outpatient regarding his multiple comorbidities 4. Might benefit from outpatient discussion of event monitor although doubtful would be a great candidate for advanced therapies-he is not really a great candidate either for amiodarone given liver involvement of his recurrent rectal cancer 5. Needs basic metabolic panel as well as CBC in about one week--still having some mild rectal bleeding 6. Have cc to his multiple outpatient physicians for care coordination and will need a transitional visit at PCPs office within about 4-5 days  Discharge Diagnoses:  Principal Problem:   Syncope and collapse Active Problems:   Hypertension   Hyperlipidemia   Coronary artery disease involving native heart without angina pectoris   Paroxysmal atrial fibrillation (HCC)   BRBPR (bright red blood per rectum)   Thrombocytopenia (HCC)   Non-insulin dependent type 2 diabetes mellitus (HCC)   Anemia, unspecified   Malnutrition of moderate degree   Chronic diastolic heart failure (HCC)   Rectal cancer (HCC)   Iron deficiency anemia due to chronic blood loss   Bradycardia   Protein-calorie malnutrition, severe   Discharge Condition: gaurded  Diet recommendation: hh low salt  Filed Weights   01/02/17 0527 01/03/17 0321 01/04/17 0300  Weight: 62.4 kg (137 lb 9.6 oz) 62.3 kg (137 lb 4.8 oz) 60.6 kg (133 lb 11.2 oz)    History of present illness:   83 year old Caucasian male CAD status post stent 7 2012 S sign angina HTN HLD Diabetes mellitus type 2 on oral  meds Paroxysmal atrial fibrillation Bilateral carotid disease Recurrent colon cancer stage IV rectal adenocarcinoma resumed liver metastases-scheduled for IR biopsy soon admitted with syncope  Noted bradycardia in the 20s blood pressures in the 90s given a bolus of normal saline, atropine and inadvertently shocked in the emergency room  Cardiology consulted  Hospital Course:  Syncope + collapse-possibly secondary to medication, anemia ?VT, NSVT Frequent PVCs and bigeminy and SVT + moderate aortic stenosis Meds were adjusted by cardiology as above Echocardiogram was repeated-EF and echo reviewed personally by Dr. Stanford Breed shows an EF of 40-45% with moderate AOS Recommended outpatient event monitor to denote whether any VT--not a great candidate for Amio--Will defer as OP to cardiology Needs Transitional visit in 4 weeks  Thrombocytopenia Microcytic anemia Rectal cancer with chronic bleeding with metastases Hemoglobin stabilizing in the 10 range and will need outpatient recheck Thrombocyte. Probably related to cancer Would probably be able to make radiation appointment at 4 PM 8/28 but will need further discussion as an outpatient regarding goals of care I had extensive discussion with patient and his nephew 8/28 and after being presented with the fax of what resuscitation with aggressive measures would entail he likes to be DO NOT RESUSCITATE  CAD PCI Chr Diastolic HF Syncope Bradycardia rec'd atropine inadvertently shocked kin ED but ok after without any CP   D/c Cholecalcif, Statin   Consultations:  Cardiology  Discharge Exam: Vitals:   01/04/17 0300 01/04/17 0700  BP: (!) 142/76 (!) 150/72  Pulse: 91 88  Resp: 20 (!) 21  Temp: 97.7 F (36.5 C) 98 F (36.7 C)  SpO2: 94%  General: eomi ncat Cardiovascular:  s1 s2 no m/r/g--Bigeminy, PVC Respiratory:  Clear no added sound abd soft nt nd   Discharge Instructions   Discharge Instructions    Diet - low  sodium heart healthy    Complete by:  As directed    Discharge instructions    Complete by:  As directed    Some of the medication dosages have changed-specifically the Coreg, Imdur and Ranexa I would recommend that you take your medications regularly but discuss with them also with your oncologist You will need it a clear discussion with the oncologist regarding the goals of your care and after our discussion in the hospital-you have elected not to be resuscitated aggressively if something were to happen to. This does not mean that we will not treat your multiple other illnesses however this should be clarified going forward in terms of the intent of your care Have discontinued a couple of medications that are not compatible with your multiple serious conditions such as her vitamin D as well as your Lipitor Please follow-up also with your outpatient physician and you can obtain an outpatient prescription for physical therapy from them I will be sure to [place] your oncologist   Increase activity slowly    Complete by:  As directed      Current Discharge Medication List    CONTINUE these medications which have CHANGED   Details  carvedilol (COREG) 3.125 MG tablet Take 1 tablet (3.125 mg total) by mouth 2 (two) times daily with a meal. Qty: 60 tablet, Refills: 0    isosorbide mononitrate (IMDUR) 30 MG 24 hr tablet Take 1 tablet (30 mg total) by mouth daily. Qty: 30 tablet, Refills: 0    ranolazine (RANEXA) 1000 MG SR tablet Take 1 tablet (1,000 mg total) by mouth 2 (two) times daily. Qty: 60 tablet, Refills: 0      CONTINUE these medications which have NOT CHANGED   Details  acetaminophen (TYLENOL) 500 MG tablet Take 1,000 mg by mouth every 6 (six) hours as needed (shoulder pain).    amLODipine (NORVASC) 2.5 MG tablet Take 2.5 mg by mouth daily.    aspirin EC 81 MG tablet Take 1 tablet (81 mg total) by mouth daily. Qty: 30 tablet, Refills: 0    donepezil (ARICEPT) 10 MG tablet Take 10  mg by mouth at bedtime.     glimepiride (AMARYL) 1 MG tablet Take 1 mg by mouth daily with breakfast.     linagliptin (TRADJENTA) 5 MG TABS tablet Take 1 tablet (5 mg total) by mouth daily. For blood sugar Qty: 30 tablet, Refills: 1    nitroGLYCERIN (NITROSTAT) 0.4 MG SL tablet PLACE ONE TABLET UNDER THE TONGUE EVERY 5 MINUTES AS NEEDED FOR CHEST PAIN. Qty: 25 tablet, Refills: 23    Propylene Glycol (SYSTANE BALANCE) 0.6 % SOLN Place 1-2 drops into both eyes 3 (three) times daily as needed (for dry eyes).     capecitabine (XELODA) 500 MG tablet Take 3 tablets (1,500 mg total) by mouth 2 (two) times daily after a meal. Qty: 90 tablet, Refills: 1    oxyCODONE (OXY IR/ROXICODONE) 5 MG immediate release tablet Take 1 tablet (5 mg total) by mouth every 6 (six) hours as needed. Qty: 20 tablet, Refills: 0      STOP taking these medications     atorvastatin (LIPITOR) 40 MG tablet      B Complex-C (B-COMPLEX WITH VITAMIN C) tablet      Cholecalciferol (VITAMIN D-3 PO)  Allergies  Allergen Reactions  . Metformin And Related Diarrhea and Nausea And Vomiting      The results of significant diagnostics from this hospitalization (including imaging, microbiology, ancillary and laboratory) are listed below for reference.    Significant Diagnostic Studies: Ct Abdomen Pelvis Wo Contrast  Result Date: 12/24/2016 CLINICAL DATA:  Left posterior rectal mass. Remote history of stage III colon cancer. EXAM: CT CHEST, ABDOMEN AND PELVIS WITHOUT CONTRAST TECHNIQUE: Multidetector CT imaging of the chest, abdomen and pelvis was performed following the standard protocol without IV contrast. COMPARISON:  Abdomen and pelvis CT 05/08/2013 FINDINGS: CT CHEST FINDINGS Cardiovascular: Cardiopericardial silhouette is at upper limits of normal for size. Coronary artery calcification is noted. Atherosclerotic calcification is noted in the wall of the thoracic aorta. Mediastinum/Nodes: Upper normal  mediastinal lymph nodes are evident, some of which show dense dystrophic calcification compatible with prior granulomatous disease. No evidence for gross hilar lymphadenopathy although assessment is limited by the lack of intravenous contrast on today's study. The esophagus has normal imaging features. There is no axillary lymphadenopathy. Lungs/Pleura: Fairly advanced changes of underlying chronic interstitial lung disease with areas of centrilobular paraseptal emphysema. There is marked bronchial wall thickening with bilateral cylindrical bronchiectasis. Advanced changes in the right upper lobe show bullous change, septal thickening and areas of dependent fluid within large thin walled air cysts. Scattered areas of apparent subpleural honeycombing are noted in the lungs bilaterally. Musculoskeletal: Bone windows reveal no worrisome lytic or sclerotic osseous lesions. CT ABDOMEN PELVIS FINDINGS Hepatobiliary: Multiple ill-defined low-density lesions are seen distributed through both hepatic lobes, new since the prior study. Index lesion in the posterior right hepatic dome (image 87 series 2) measures 1.7 cm. A second discrete lesion identified in the posterior right liver (image 98) measures 2.4 cm. Medial segment left liver lesions seen on image 105 measures 1.8 cm. Gallbladder surgically absent. No intrahepatic or extrahepatic biliary dilation. Pancreas: The No focal mass lesion. No dilatation of the main duct. No intraparenchymal cyst. No peripancreatic edema. Spleen: Calcified granulomata. Adrenals/Urinary Tract: No adrenal nodule or mass. Several stones are seen in the right kidney with dense atherosclerotic calcification noted in artery is of the renal hilum. Dominant stone is in the lower pole measuring up to 7 mm maximum diameter without evidence for obstruction. Left kidney also demonstrates multiple stones, the largest of which is nonobstructing in a lower pole calyx and measures 12 x 13 x 12 mm. 2.6 cm  lesion in the lower pole of the left kidney approaches water attenuation and is likely a cyst. Other smaller low-density lesions in the left kidney measure about 10 mm and have near water attenuation, also likely cysts. No evidence for hydroureter. The urinary bladder appears normal for the degree of distention. Stomach/Bowel: Stomach is nondistended. No gastric wall thickening. No evidence of outlet obstruction. Duodenum is normally positioned as is the ligament of Treitz. No small bowel wall thickening. No small bowel dilatation. Patient is status post right hemicolectomy with unremarkable appearing ileocolic anastomosis. Diverticular changes are noted in the left colon without evidence of diverticulitis. Prominent soft tissue fullness in the distal rectum near the anal verge presumably reflects known neoplasm. Vascular/Lymphatic: There is abdominal aortic atherosclerosis without aneurysm. Maximum aortic diameter measured at 2.9 cm. There is no gastrohepatic or hepatoduodenal ligament lymphadenopathy. No intraperitoneal or retroperitoneal lymphadenopathy. No pelvic sidewall lymphadenopathy. Specifically, no evidence for perirectal lymphadenopathy. Reproductive: The prostate gland and seminal vesicles have normal imaging features. Other: No intraperitoneal free fluid. Musculoskeletal: Bilateral groin  hernias contain only fat. Bone windows reveal no worrisome lytic or sclerotic osseous lesions. IMPRESSION: 1. Prominent soft tissue fullness distal rectum near the anal verge presumably reflects known neoplasm. No evidence for perirectal lymphadenopathy. 2. Multiple ill-defined low-density lesions in the liver, new since prior study of 2014. These have day, ill-defined margins and are highly suspicious for metastatic disease. Dominant lesion measures up to 2.4 cm. 3. Advanced chronic changes in the lungs with areas of marked bullous change in the right upper lobe, some of the bullous containing fluid levels. There is  diffuse bronchial wall thickening, bronchiectasis, and features suggesting underlying UIP. 4.  Aortic Atherosclerois (ICD10-170.0) Electronically Signed   By: Misty Stanley M.D.   On: 12/24/2016 08:25   Dg Chest 2 View  Result Date: 01/01/2017 CLINICAL DATA:  Bradycardia EXAM: CHEST  2 VIEW COMPARISON:  10/06/2016 FINDINGS: Severe chronic interstitial lung disease again noted. Heart is normal size. No effusions. No acute bony abnormality. No definite acute process. IMPRESSION: Severe chronic interstitial lung disease. No definite acute process. Electronically Signed   By: Rolm Baptise M.D.   On: 01/01/2017 17:43   Ct Chest Wo Contrast  Result Date: 12/24/2016 CLINICAL DATA:  Left posterior rectal mass. Remote history of stage III colon cancer. EXAM: CT CHEST, ABDOMEN AND PELVIS WITHOUT CONTRAST TECHNIQUE: Multidetector CT imaging of the chest, abdomen and pelvis was performed following the standard protocol without IV contrast. COMPARISON:  Abdomen and pelvis CT 05/08/2013 FINDINGS: CT CHEST FINDINGS Cardiovascular: Cardiopericardial silhouette is at upper limits of normal for size. Coronary artery calcification is noted. Atherosclerotic calcification is noted in the wall of the thoracic aorta. Mediastinum/Nodes: Upper normal mediastinal lymph nodes are evident, some of which show dense dystrophic calcification compatible with prior granulomatous disease. No evidence for gross hilar lymphadenopathy although assessment is limited by the lack of intravenous contrast on today's study. The esophagus has normal imaging features. There is no axillary lymphadenopathy. Lungs/Pleura: Fairly advanced changes of underlying chronic interstitial lung disease with areas of centrilobular paraseptal emphysema. There is marked bronchial wall thickening with bilateral cylindrical bronchiectasis. Advanced changes in the right upper lobe show bullous change, septal thickening and areas of dependent fluid within large thin  walled air cysts. Scattered areas of apparent subpleural honeycombing are noted in the lungs bilaterally. Musculoskeletal: Bone windows reveal no worrisome lytic or sclerotic osseous lesions. CT ABDOMEN PELVIS FINDINGS Hepatobiliary: Multiple ill-defined low-density lesions are seen distributed through both hepatic lobes, new since the prior study. Index lesion in the posterior right hepatic dome (image 87 series 2) measures 1.7 cm. A second discrete lesion identified in the posterior right liver (image 98) measures 2.4 cm. Medial segment left liver lesions seen on image 105 measures 1.8 cm. Gallbladder surgically absent. No intrahepatic or extrahepatic biliary dilation. Pancreas: The No focal mass lesion. No dilatation of the main duct. No intraparenchymal cyst. No peripancreatic edema. Spleen: Calcified granulomata. Adrenals/Urinary Tract: No adrenal nodule or mass. Several stones are seen in the right kidney with dense atherosclerotic calcification noted in artery is of the renal hilum. Dominant stone is in the lower pole measuring up to 7 mm maximum diameter without evidence for obstruction. Left kidney also demonstrates multiple stones, the largest of which is nonobstructing in a lower pole calyx and measures 12 x 13 x 12 mm. 2.6 cm lesion in the lower pole of the left kidney approaches water attenuation and is likely a cyst. Other smaller low-density lesions in the left kidney measure about 10 mm and  have near water attenuation, also likely cysts. No evidence for hydroureter. The urinary bladder appears normal for the degree of distention. Stomach/Bowel: Stomach is nondistended. No gastric wall thickening. No evidence of outlet obstruction. Duodenum is normally positioned as is the ligament of Treitz. No small bowel wall thickening. No small bowel dilatation. Patient is status post right hemicolectomy with unremarkable appearing ileocolic anastomosis. Diverticular changes are noted in the left colon without  evidence of diverticulitis. Prominent soft tissue fullness in the distal rectum near the anal verge presumably reflects known neoplasm. Vascular/Lymphatic: There is abdominal aortic atherosclerosis without aneurysm. Maximum aortic diameter measured at 2.9 cm. There is no gastrohepatic or hepatoduodenal ligament lymphadenopathy. No intraperitoneal or retroperitoneal lymphadenopathy. No pelvic sidewall lymphadenopathy. Specifically, no evidence for perirectal lymphadenopathy. Reproductive: The prostate gland and seminal vesicles have normal imaging features. Other: No intraperitoneal free fluid. Musculoskeletal: Bilateral groin hernias contain only fat. Bone windows reveal no worrisome lytic or sclerotic osseous lesions. IMPRESSION: 1. Prominent soft tissue fullness distal rectum near the anal verge presumably reflects known neoplasm. No evidence for perirectal lymphadenopathy. 2. Multiple ill-defined low-density lesions in the liver, new since prior study of 2014. These have day, ill-defined margins and are highly suspicious for metastatic disease. Dominant lesion measures up to 2.4 cm. 3. Advanced chronic changes in the lungs with areas of marked bullous change in the right upper lobe, some of the bullous containing fluid levels. There is diffuse bronchial wall thickening, bronchiectasis, and features suggesting underlying UIP. 4.  Aortic Atherosclerois (ICD10-170.0) Electronically Signed   By: Misty Stanley M.D.   On: 12/24/2016 08:25    Microbiology: Recent Results (from the past 240 hour(s))  MRSA PCR Screening     Status: None   Collection Time: 01/02/17 12:10 AM  Result Value Ref Range Status   MRSA by PCR NEGATIVE NEGATIVE Final    Comment:        The GeneXpert MRSA Assay (FDA approved for NASAL specimens only), is one component of a comprehensive MRSA colonization surveillance program. It is not intended to diagnose MRSA infection nor to guide or monitor treatment for MRSA infections.       Labs: Basic Metabolic Panel:  Recent Labs Lab 12/28/16 1549 01/01/17 1600 01/01/17 1634 01/02/17 0645 01/03/17 0151  NA 135* 136 139 136 135  K 4.6 4.0 3.8 3.7 3.9  CL  --  107 106 105 105  CO2 24 24  --  24 22  GLUCOSE 188* 146* 134* 108* 123*  BUN 20.2 16 16 11 14   CREATININE 1.2 1.02 0.90 0.89 0.84  CALCIUM 9.3 8.0*  --  8.3* 8.5*  MG  --  1.7  --  2.0  --    Liver Function Tests:  Recent Labs Lab 12/28/16 1549 01/01/17 1600  AST 23 23  ALT 20 19  ALKPHOS 109 79  BILITOT 0.71 0.7  PROT 7.2 5.9*  ALBUMIN 3.3* 2.5*   No results for input(s): LIPASE, AMYLASE in the last 168 hours. No results for input(s): AMMONIA in the last 168 hours. CBC:  Recent Labs Lab 12/28/16 1549 01/01/17 1600 01/01/17 1634 01/01/17 2227 01/02/17 0645 01/03/17 0151  WBC 7.0 6.0  --  7.7 6.9 8.4  NEUTROABS 5.0 4.5  --   --   --   --   HGB 11.2* 8.1* 8.8* 10.8* 9.9* 10.8*  HCT 33.7* 23.7* 26.0* 31.5* 29.7* 31.5*  MCV 106.3* 103.0*  --  104.0* 103.1* 102.9*  PLT 182 135*  --  192  172 189   Cardiac Enzymes:  Recent Labs Lab 01/01/17 1600 01/01/17 2227 01/02/17 0152 01/02/17 0645  TROPONINI 0.03* 0.03* 0.03* 0.03*   BNP: BNP (last 3 results)  Recent Labs  01/01/17 1546  BNP 440.2*    ProBNP (last 3 results) No results for input(s): PROBNP in the last 8760 hours.  CBG:  Recent Labs Lab 01/03/17 0758 01/03/17 1229 01/03/17 1556 01/03/17 2059 01/04/17 0758  GLUCAP 128* 231* 214* 107* 120*       Signed:  Nita Sells MD   [facility] 01/04/2017, 9:44 AM

## 2017-01-04 NOTE — Progress Notes (Signed)
Progress Note  Patient Name: [name] Date of Encounter: 01/04/2017  Primary Cardiologist: Dr [name]  Subjective   No chest pain or dyspnea  Inpatient Medications    Scheduled Meds: . amLODipine  2.5 mg Oral Daily  . aspirin EC  81 mg Oral Daily  . atorvastatin  40 mg Oral q1800  . B-complex with vitamin C  1 tablet Oral Daily  . carvedilol  3.125 mg Oral BID WC  . donepezil  10 mg Oral QHS  . feeding supplement (ENSURE ENLIVE)  237 mL Oral BID BM  . insulin aspart  0-9 Units Subcutaneous TID WC  . isosorbide mononitrate  30 mg Oral Daily  . ranolazine  1,000 mg Oral BID  . sodium chloride flush  3 mL Intravenous Q12H   Continuous Infusions:  PRN Meds: acetaminophen, albuterol, nitroGLYCERIN, polyvinyl alcohol   Vital Signs    Vitals:   01/03/17 1916 01/03/17 2309 01/04/17 0300 01/04/17 0700  BP: (!) 151/63 128/65 (!) 142/76 (!) 150/72  Pulse: 67 70 91 88  Resp: 20 17 20  (!) 21  Temp: (!) 97.4 F (36.3 C) 99 F (37.2 C) 97.7 F (36.5 C) 98 F (36.7 C)  TempSrc: Oral Oral Oral Oral  SpO2: 98% 95% 94%   Weight:   60.6 kg (133 lb 11.2 oz)   Height:       No intake or output data in the 24 hours ending 01/04/17 0810 Filed Weights   01/02/17 0527 01/03/17 0321 01/04/17 0300  Weight: 62.4 kg (137 lb 9.6 oz) 62.3 kg (137 lb 4.8 oz) 60.6 kg (133 lb 11.2 oz)    Telemetry    Sinus with PVCs and NSVT- Personally Reviewed   Physical Exam   GEN: WD/WN No acute distress.   Neck: supple Cardiac: irregular, 2/6 systolic murmur Respiratory: Clear to auscultation bilaterally; no wheeze. GI: Soft, nontender, non-distended, no masses palpated MS: No edema Neuro:  Grossly intact   Labs    Chemistry Recent Labs Lab 12/28/16 1549  01/01/17 1600 01/01/17 1634 01/02/17 0645 01/03/17 0151  NA 135*  < > 136 139 136 135  K 4.6  < > 4.0 3.8 3.7 3.9  CL  --   < > 107 106 105 105  CO2 24  --  24  --  24 22  GLUCOSE 188*  < > 146* 134* 108* 123*  BUN  20.2  < > 16 16 11 14   CREATININE 1.2  < > 1.02 0.90 0.89 0.84  CALCIUM 9.3  --  8.0*  --  8.3* 8.5*  PROT 7.2  --  5.9*  --   --   --   ALBUMIN 3.3*  --  2.5*  --   --   --   AST 23  --  23  --   --   --   ALT 20  --  19  --   --   --   ALKPHOS 109  --  79  --   --   --   BILITOT 0.71  --  0.7  --   --   --   GFRNONAA  --   --  >60  --  >60 >60  GFRAA  --   --  >60  --  >60 >60  ANIONGAP 5  --  5  --  7 8  < > = values in this interval not displayed.   Hematology  Recent Labs Lab 01/01/17 2227 01/02/17 4403  01/03/17 0151  WBC 7.7 6.9 8.4  RBC 3.03* 2.88* 3.06*  HGB 10.8* 9.9* 10.8*  HCT 31.5* 29.7* 31.5*  MCV 104.0* 103.1* 102.9*  MCH 35.6* 34.4* 35.3*  MCHC 34.3 33.3 34.3  RDW 13.4 13.2 13.4  PLT 192 172 189    Cardiac Enzymes  Recent Labs Lab 01/01/17 1600 01/01/17 2227 01/02/17 0152 01/02/17 0645  TROPONINI 0.03* 0.03* 0.03* 0.03*     Recent Labs Lab 01/01/17 1632  TROPIPOC 0.00     BNP  Recent Labs Lab 01/01/17 1546  BNP 440.2*        Patient Profile     83 y.o. male admitted with syncope.  Assessment & Plan    1 syncope-etiology unclear. ER note states heart rate in the 40s and clinically in the 20s (possibly related to not palpating ectopy) but I have no documentation by strips. Telemetry personally reviewed and shows sinus with PVCs and NSVT. No bradycardia. Continue coreg 3.125 mg BID. Echo personally reviewed and shows EF 40-45 and moderate AS. Given age and metastatic cancer, pt not a candidate for ICD or aggressive cardiac procedures. Would arrange outpt event monitor. If VT demonstrated and correlates with syncope could consider amiodarone. I have initiated code status discussions. Pt instructed not to drive for six months. FU Dr [name] 4 weeks after DC.  2 coronary artery disease-continue aspirin and statin.  3 hypertension-continue present meds and fu as outpt.  4 history of paroxysmal atrial fibrillation-CHADSvasc 5. Patient not  on Coumadin at present. This is due to rectal cancer and intermittent hematochezia. In sinus at present.  5 moderate aortic stenosis-not a candidate for further intervention.  6 minimally elevated troponin-not consistent with acute coronary syndrome. We'll not pursue further ischemic evaluation at this point. Note nuclear study April 2018 showed infarct but no ischemia and ejection fraction 48%.  7 metastatic cancer- per oncology.  Signed, Kirk Ruths, MD  01/04/2017, 8:10 AM

## 2017-01-05 ENCOUNTER — Other Ambulatory Visit: Payer: Self-pay | Admitting: Radiology

## 2017-01-05 ENCOUNTER — Other Ambulatory Visit: Payer: Self-pay | Admitting: *Deleted

## 2017-01-05 ENCOUNTER — Ambulatory Visit
Admission: RE | Admit: 2017-01-05 | Discharge: 2017-01-05 | Disposition: A | Discharge status: Home / Self Care | Payer: Medicare Other | Source: Ambulatory Visit | Attending: Radiation Oncology | Admitting: Radiation Oncology

## 2017-01-05 DIAGNOSIS — Z510 Encounter for antineoplastic radiation therapy: Secondary | ICD-10-CM | POA: Diagnosis not present

## 2017-01-05 MED ORDER — AMLODIPINE BESYLATE 2.5 MG PO TABS: 2.5000 mg | ORAL_TABLET | Freq: Every day | ORAL | 3 refills | Status: DC

## 2017-01-05 NOTE — Telephone Encounter (Signed)
Patients nephew called and requested a refill on amlodipine for the patient. He states that Dr [name] has never filled this for him. Okay to refill? Please advise. Thanks, MI

## 2017-01-06 ENCOUNTER — Ambulatory Visit (HOSPITAL_COMMUNITY)
Admission: RE | Admit: 2017-01-06 | Discharge: 2017-01-06 | Disposition: A | Discharge status: Home / Self Care | Payer: Medicare Other | Source: Ambulatory Visit | Attending: Hematology | Admitting: Hematology

## 2017-01-06 ENCOUNTER — Ambulatory Visit
Admission: RE | Admit: 2017-01-06 | Discharge: 2017-01-06 | Disposition: A | Discharge status: Home / Self Care | Payer: Medicare Other | Source: Ambulatory Visit | Attending: Radiation Oncology | Admitting: Radiation Oncology

## 2017-01-06 ENCOUNTER — Encounter (HOSPITAL_COMMUNITY): Payer: Self-pay

## 2017-01-06 ENCOUNTER — Telehealth: Payer: Self-pay | Admitting: Pharmacist

## 2017-01-06 DIAGNOSIS — Z7982 Long term (current) use of aspirin: Secondary | ICD-10-CM | POA: Diagnosis not present

## 2017-01-06 DIAGNOSIS — Z85038 Personal history of other malignant neoplasm of large intestine: Secondary | ICD-10-CM | POA: Insufficient documentation

## 2017-01-06 DIAGNOSIS — I491 Atrial premature depolarization: Secondary | ICD-10-CM | POA: Insufficient documentation

## 2017-01-06 DIAGNOSIS — Z8249 Family history of ischemic heart disease and other diseases of the circulatory system: Secondary | ICD-10-CM | POA: Insufficient documentation

## 2017-01-06 DIAGNOSIS — Z9889 Other specified postprocedural states: Secondary | ICD-10-CM | POA: Diagnosis not present

## 2017-01-06 DIAGNOSIS — Z7984 Long term (current) use of oral hypoglycemic drugs: Secondary | ICD-10-CM | POA: Diagnosis not present

## 2017-01-06 DIAGNOSIS — N183 Chronic kidney disease, stage 3 (moderate): Secondary | ICD-10-CM | POA: Diagnosis not present

## 2017-01-06 DIAGNOSIS — C189 Malignant neoplasm of colon, unspecified: Secondary | ICD-10-CM

## 2017-01-06 DIAGNOSIS — E785 Hyperlipidemia, unspecified: Secondary | ICD-10-CM | POA: Diagnosis not present

## 2017-01-06 DIAGNOSIS — Z87442 Personal history of urinary calculi: Secondary | ICD-10-CM | POA: Diagnosis not present

## 2017-01-06 DIAGNOSIS — C772 Secondary and unspecified malignant neoplasm of intra-abdominal lymph nodes: Secondary | ICD-10-CM | POA: Diagnosis present

## 2017-01-06 DIAGNOSIS — I4510 Unspecified right bundle-branch block: Secondary | ICD-10-CM | POA: Insufficient documentation

## 2017-01-06 DIAGNOSIS — Z955 Presence of coronary angioplasty implant and graft: Secondary | ICD-10-CM | POA: Diagnosis not present

## 2017-01-06 DIAGNOSIS — Z9049 Acquired absence of other specified parts of digestive tract: Secondary | ICD-10-CM | POA: Diagnosis not present

## 2017-01-06 DIAGNOSIS — I130 Hypertensive heart and chronic kidney disease with heart failure and stage 1 through stage 4 chronic kidney disease, or unspecified chronic kidney disease: Secondary | ICD-10-CM | POA: Insufficient documentation

## 2017-01-06 DIAGNOSIS — C20 Malignant neoplasm of rectum: Secondary | ICD-10-CM | POA: Diagnosis present

## 2017-01-06 DIAGNOSIS — I5022 Chronic systolic (congestive) heart failure: Secondary | ICD-10-CM | POA: Insufficient documentation

## 2017-01-06 DIAGNOSIS — Z9842 Cataract extraction status, left eye: Secondary | ICD-10-CM | POA: Insufficient documentation

## 2017-01-06 DIAGNOSIS — Z510 Encounter for antineoplastic radiation therapy: Secondary | ICD-10-CM | POA: Diagnosis not present

## 2017-01-06 DIAGNOSIS — E1122 Type 2 diabetes mellitus with diabetic chronic kidney disease: Secondary | ICD-10-CM | POA: Diagnosis not present

## 2017-01-06 DIAGNOSIS — Z87891 Personal history of nicotine dependence: Secondary | ICD-10-CM | POA: Insufficient documentation

## 2017-01-06 DIAGNOSIS — Z9841 Cataract extraction status, right eye: Secondary | ICD-10-CM | POA: Insufficient documentation

## 2017-01-06 DIAGNOSIS — Z803 Family history of malignant neoplasm of breast: Secondary | ICD-10-CM | POA: Diagnosis not present

## 2017-01-06 DIAGNOSIS — Z888 Allergy status to other drugs, medicaments and biological substances status: Secondary | ICD-10-CM | POA: Insufficient documentation

## 2017-01-06 DIAGNOSIS — I252 Old myocardial infarction: Secondary | ICD-10-CM | POA: Insufficient documentation

## 2017-01-06 DIAGNOSIS — I2510 Atherosclerotic heart disease of native coronary artery without angina pectoris: Secondary | ICD-10-CM | POA: Diagnosis not present

## 2017-01-06 DIAGNOSIS — C787 Secondary malignant neoplasm of liver and intrahepatic bile duct: Secondary | ICD-10-CM | POA: Insufficient documentation

## 2017-01-06 DIAGNOSIS — I480 Paroxysmal atrial fibrillation: Secondary | ICD-10-CM | POA: Insufficient documentation

## 2017-01-06 LAB — GLUCOSE, CAPILLARY: GLUCOSE-CAPILLARY: 205 mg/dL — AB (ref 65–99)

## 2017-01-06 LAB — CBC
HCT: 28.9 % — ABNORMAL LOW (ref 39.0–52.0)
HEMOGLOBIN: 10 g/dL — AB (ref 13.0–17.0)
MCH: 35.5 pg — ABNORMAL HIGH (ref 26.0–34.0)
MCHC: 34.6 g/dL (ref 30.0–36.0)
MCV: 102.5 fL — ABNORMAL HIGH (ref 78.0–100.0)
Platelets: 184 10*3/uL (ref 150–400)
RBC: 2.82 MIL/uL — AB (ref 4.22–5.81)
RDW: 13.1 % (ref 11.5–15.5)
WBC: 7.8 10*3/uL (ref 4.0–10.5)

## 2017-01-06 LAB — APTT: aPTT: 28 seconds (ref 24–36)

## 2017-01-06 LAB — PROTIME-INR
INR: 1.16
PROTHROMBIN TIME: 14.7 s (ref 11.4–15.2)

## 2017-01-06 MED ORDER — LIDOCAINE HCL 1 % IJ SOLN
INTRAMUSCULAR | Status: AC
  Filled 2017-01-06: qty 10

## 2017-01-06 MED ORDER — HYDROCODONE-ACETAMINOPHEN 5-325 MG PO TABS: 1.0000 | ORAL_TABLET | ORAL | Status: DC | PRN

## 2017-01-06 MED ORDER — MIDAZOLAM HCL 2 MG/2ML IJ SOLN
INTRAMUSCULAR | Status: AC
  Filled 2017-01-06: qty 4

## 2017-01-06 MED ORDER — FENTANYL CITRATE (PF) 100 MCG/2ML IJ SOLN
INTRAMUSCULAR | Status: AC | PRN
  Administered 2017-01-06 (×2): 25 ug via INTRAVENOUS

## 2017-01-06 MED ORDER — FENTANYL CITRATE (PF) 100 MCG/2ML IJ SOLN
INTRAMUSCULAR | Status: AC
  Filled 2017-01-06: qty 4

## 2017-01-06 MED ORDER — SODIUM CHLORIDE 0.9 % IV SOLN
INTRAVENOUS | Status: DC
  Administered 2017-01-06: 12:00:00 via INTRAVENOUS

## 2017-01-06 MED ORDER — CAPECITABINE 500 MG PO TABS: ORAL_TABLET | ORAL | 0 refills | Status: DC

## 2017-01-06 NOTE — Consult Note (Signed)
Chief Complaint: Patient was seen in consultation today for image guided liver lesion biopsy  Referring Physician(s): Feng,Yan  Supervising Physician: Markus Daft  Patient Status: [name]  History of Present Illness: [name] is a 83 y.o. male with history of colon cancer in 1992, status post right colectomy with chemoradiation. Patient had mesenteric mass recurrence 1993 with resection and chemoradiation. He has now been diagnosed with rectal carcinoma with recent CT revealing:  1. Prominent soft tissue fullness distal rectum near the anal verge presumably reflects known neoplasm. No evidence for perirectal lymphadenopathy. 2. Multiple ill-defined low-density lesions in the liver, new since prior study of 2014. These have day, ill-defined margins and are highly suspicious for metastatic disease. Dominant lesion measures up to 2.4 cm. 3. Advanced chronic changes in the lungs with areas of marked bullous change in the right upper lobe, some of the bullous containing fluid levels. There is diffuse bronchial wall thickening, bronchiectasis, and features suggesting underlying UIP. 4.  Aortic Atherosclerois (ICD10-170.  He presents today for image guided liver lesion biopsy for further evaluation. He was just recently discharged from [facility] 8/28 after syncopal episode.  Past Medical History:  Diagnosis Date  . Aortic stenosis, moderate cardiolgosit-  dr Cathie Olden   per last echo 10-15-2016 aortic stenosis somewhat worse since last echo 11-14-2015 moderate calcification and thickened leaflets, valve area 1.01cm^2,  mead gradiant 67mmHg, peak grandiant 43mmHgf  . Barrett esophagus dx 2015  . Chronic stable angina ([place])   . Chronic systolic CHF (congestive heart failure) ([place]) followed by dr Cathie Olden   a. EF previously 35-40%. 03/ 2013 b. improved to 55-65% by cath 08/2013./  per echo 06/ 2018 ef 50-55%  . CKD (chronic kidney disease), stage III   . Constipation    . Coronary artery disease cardiologist-- dr Cathie Olden   a. s/p PCI w/ DES to mLAD 08/05/10. b. NSTEMI 08/2013 (mildly elev trop) - cath showing widely patent stent, 80% prox D1 (small vessel) with recommendation for medical management, residual 60% ostial PDA, <20% LCx, LVEF 55-65%.   . Depression   . History of colon cancer, stage III oncologist- dr Waymon Budge--  per last note no recurrence 2013   dx 10/ 1992,  Stage III-- treament post right colecotmy, chemoradiation/  1993 localized recurrent mesenteric mass -- treatment post resection mass and chemoradiation  . History of kidney stones   . History of non-ST elevation myocardial infarction (NSTEMI)    08-14-2013  . History of small bowel obstruction    03/ 2005 partial SBO due to adhesions  post lysis adhesions and small bowel resection  . Hyperlipidemia   . Hypertension   . Macrocytic anemia dx 1992   chronic  . PAF (paroxysmal atrial fibrillation) [facility]) cardiologist-  dr Cathie Olden   a. failed TEE due to inability to pass probe into the esophagus in March 2013. b. On Coumadin. Was in NSR 08/2013 admission.  . Premature atrial contractions   . Premature ventricular contractions   . RBBB (right bundle branch block)   . Rectal mass   . S/P drug eluting coronary stent placement 08/05/2010   DES x1  to midLAD  . Type II diabetes mellitus ([place])   . Wears glasses     Past Surgical History:  Procedure Laterality Date  . ABDOMINAL MASS RESECTION  11/1991   localized recurrent colon cancer mesenteric lymph node mass (area of distal aorta and vena cava) and debulking  . CARDIAC CATHETERIZATION  08/03/10  dr Lia Foyer   preserved LVSF, mod. high grade stenosis LAD, diagonal stenosis at the bend 60-70%  . CARDIAC CATHETERIZATION  08-09-2016  dr Martinique   ostialLAD 30%, wide patent mLAD stent, pCFX 40%,  mild irregularities throughout RCA < 20%  . CARDIAC CATHETERIZATION N/A 05/20/2016   Procedure: Left Heart Cath and Coronary Angiography;  Surgeon:  Lorretta Harp, MD;  Location: [facility];  Service: Cardiovascular;  Laterality: N/A; normal LVF,  D2 80%.  ostRCA 50%,  LAD stent patent  . CARDIOVASCULAR STRESS TEST  09-01-2016   dr Cathie Olden   Intermediate risk nuclear study w/ medium non-reversible defect of moderate severity in the basal inferolateral and mid inferolateral location/ no ST segment deviation noted during stress,  nuclear stress ef 48% (45-54%)  . CATARACT EXTRACTION W/ INTRAOCULAR LENS  IMPLANT, BILATERAL    . CORONARY ANGIOPLASTY WITH STENT PLACEMENT  08/05/10   dr Lia Foyer   PCI and DES x1  LAD  . CYSTO/  URETEROSCOPIC STONE EXTRACTION Left 10/18/2002  . CYSTOSCOPY WITH RETROGRADE PYELOGRAM, URETEROSCOPY AND STENT PLACEMENT Bilateral 05/18/2013   Procedure: CYSTOSCOPY WITH BILATERAL RETROGRADE PYELOGRAM, LEFT URETEROSCOPY AND LEFT STENT PLACEMENT;  Surgeon: Alexis Frock, MD;  Location: [facility];  Service: Urology;  Laterality: Bilateral;  . EXPLORATORY LAPAROTOMY W/ LYSIS ADHESIONS AND SMALL BOWEL RESECTION  07/09/2003   partial SBO  . FLEXIBLE SIGMOIDOSCOPY N/A 10/07/2016   Procedure: FLEXIBLE SIGMOIDOSCOPY;  Surgeon: Wilford Corner, MD;  Location: [facility] ENDOSCOPY;  Service: Endoscopy;  Laterality: N/A;  . LAPAROSCOPIC CHOLECYSTECTOMY  12-04-2009  dr Margot Chimes  . LEFT HEART CATHETERIZATION WITH CORONARY ANGIOGRAM N/A 08/15/2013   Procedure: LEFT HEART CATHETERIZATION WITH CORONARY ANGIOGRAM;  Surgeon: Peter M Martinique, MD;  Location: [facility] CATH LAB;  Service: Cardiovascular;  Laterality: N/A; single vessel obstructive CAD involving a small diagonal branch, patent LAD stent , normal LVF  . RIGHT COLECTOMY  03/1991   w/ appendectomy  . TONSILLECTOMY    . TRANSTHORACIC ECHOCARDIOGRAM  10-15-2016  dr Cathie Olden   grade 1 diastolic dysfunction, ef 54-27%/  moderate AV stenosis (valve area 1.012cm^2, mean gradiant 74mmHg, peak grandiant 65mmHg)/ moderate LAE/  trivial PR/  mild RAE    Allergies: Metformin and  related  Medications: Prior to Admission medications   Medication Sig Start Date End Date Taking? Authorizing Provider  acetaminophen (TYLENOL) 500 MG tablet Take 1,000 mg by mouth every 6 (six) hours as needed (shoulder pain).    [provider]  amLODipine (NORVASC) 2.5 MG tablet Take 1 tablet (2.5 mg total) by mouth daily. 01/05/17   Nahser, Wonda Cheng, MD  aspirin EC 81 MG tablet Take 1 tablet (81 mg total) by mouth daily. 10/09/16   Bonnielee Haff, MD  capecitabine (XELODA) 500 MG tablet Take 3 tablets (1,500 mg total) by mouth 2 (two) times daily after a meal. 12/29/16   Truitt Merle, MD  carvedilol (COREG) 3.125 MG tablet Take 1 tablet (3.125 mg total) by mouth 2 (two) times daily with a meal. 01/04/17   Nita Sells, MD  donepezil (ARICEPT) 10 MG tablet Take 10 mg by mouth at bedtime.  05/04/16   [provider]  glimepiride (AMARYL) 1 MG tablet Take 1 mg by mouth daily with breakfast.  05/29/16   [provider]  isosorbide mononitrate (IMDUR) 30 MG 24 hr tablet Take 1 tablet (30 mg total) by mouth daily. 01/05/17   Nita Sells, MD  linagliptin (TRADJENTA) 5 MG TABS tablet Take 1 tablet (5 mg total) by mouth  daily. For blood sugar Patient taking differently: Take 5 mg by mouth daily. For blood sugar 11/04/15   Johnson, Clanford L, MD  nitroGLYCERIN (NITROSTAT) 0.4 MG SL tablet PLACE ONE TABLET UNDER THE TONGUE EVERY 5 MINUTES AS NEEDED FOR CHEST PAIN. 08/16/16   Nahser, Wonda Cheng, MD  oxyCODONE (OXY IR/ROXICODONE) 5 MG immediate release tablet Take 1 tablet (5 mg total) by mouth every 6 (six) hours as needed. Patient not taking: Reported on 01/01/2017 12/14/55   Leighton Ruff, MD  Propylene Glycol (SYSTANE BALANCE) 0.6 % SOLN Place 1-2 drops into both eyes 3 (three) times daily as needed (for dry eyes).     [provider]  ranolazine (RANEXA) 1000 MG SR tablet Take 1 tablet (1,000 mg total) by mouth 2 (two) times daily. 01/04/17   Nita Sells,  MD     Family History  Problem Relation Age of Onset  . Cancer Mother        breast cancer   . Ulcers Father 54  . Heart attack Brother 67    Social History   Social History  . Marital status: Single    Spouse name: N/A  . Number of children: 0  . Years of education: N/A   Occupational History  . Retired    Social History Main Topics  . Smoking status: Former Smoker    Packs/day: 1.00    Years: 39.00    Types: Cigarettes    Quit date: 05/10/1992  . Smokeless tobacco: Never Used  . Alcohol use 1.2 oz/week    2 Shots of liquor per week     Comment: occasionally.  . Drug use: No  . Sexual activity: No   Other Topics Concern  . Not on file   Social History Narrative   Lives alone.        Review of Systems: He currently denies fever, headache, chest pain, worsening dyspnea, abdominal/back pain, nausea, vomiting. He does have occasional cough and some blood in stool.  Vital Signs: Vitals:   01/06/17 1115  BP: (!) 131/59  Pulse: 78  Resp: 18  Temp: (!) 97.4 F (36.3 C)  SpO2: 96%      Physical Exam awake, alert. Chest with few rhonchi noted; heart with irregular rhythm, positive murmur, normal rate; abdomen soft, positive bowel sounds, nontender. No lower extremity edema.  Mallampati Score:     Imaging: Ct Abdomen Pelvis Wo Contrast  Result Date: 12/24/2016 CLINICAL DATA:  Left posterior rectal mass. Remote history of stage III colon cancer. EXAM: CT CHEST, ABDOMEN AND PELVIS WITHOUT CONTRAST TECHNIQUE: Multidetector CT imaging of the chest, abdomen and pelvis was performed following the standard protocol without IV contrast. COMPARISON:  Abdomen and pelvis CT 05/08/2013 FINDINGS: CT CHEST FINDINGS Cardiovascular: Cardiopericardial silhouette is at upper limits of normal for size. Coronary artery calcification is noted. Atherosclerotic calcification is noted in the wall of the thoracic aorta. Mediastinum/Nodes: Upper normal mediastinal lymph nodes are  evident, some of which show dense dystrophic calcification compatible with prior granulomatous disease. No evidence for gross hilar lymphadenopathy although assessment is limited by the lack of intravenous contrast on today's study. The esophagus has normal imaging features. There is no axillary lymphadenopathy. Lungs/Pleura: Fairly advanced changes of underlying chronic interstitial lung disease with areas of centrilobular paraseptal emphysema. There is marked bronchial wall thickening with bilateral cylindrical bronchiectasis. Advanced changes in the right upper lobe show bullous change, septal thickening and areas of dependent fluid within large thin walled air cysts. Scattered areas of  apparent subpleural honeycombing are noted in the lungs bilaterally. Musculoskeletal: Bone windows reveal no worrisome lytic or sclerotic osseous lesions. CT ABDOMEN PELVIS FINDINGS Hepatobiliary: Multiple ill-defined low-density lesions are seen distributed through both hepatic lobes, new since the prior study. Index lesion in the posterior right hepatic dome (image 87 series 2) measures 1.7 cm. A second discrete lesion identified in the posterior right liver (image 98) measures 2.4 cm. Medial segment left liver lesions seen on image 105 measures 1.8 cm. Gallbladder surgically absent. No intrahepatic or extrahepatic biliary dilation. Pancreas: The No focal mass lesion. No dilatation of the main duct. No intraparenchymal cyst. No peripancreatic edema. Spleen: Calcified granulomata. Adrenals/Urinary Tract: No adrenal nodule or mass. Several stones are seen in the right kidney with dense atherosclerotic calcification noted in artery is of the renal hilum. Dominant stone is in the lower pole measuring up to 7 mm maximum diameter without evidence for obstruction. Left kidney also demonstrates multiple stones, the largest of which is nonobstructing in a lower pole calyx and measures 12 x 13 x 12 mm. 2.6 cm lesion in the lower pole of  the left kidney approaches water attenuation and is likely a cyst. Other smaller low-density lesions in the left kidney measure about 10 mm and have near water attenuation, also likely cysts. No evidence for hydroureter. The urinary bladder appears normal for the degree of distention. Stomach/Bowel: Stomach is nondistended. No gastric wall thickening. No evidence of outlet obstruction. Duodenum is normally positioned as is the ligament of Treitz. No small bowel wall thickening. No small bowel dilatation. Patient is status post right hemicolectomy with unremarkable appearing ileocolic anastomosis. Diverticular changes are noted in the left colon without evidence of diverticulitis. Prominent soft tissue fullness in the distal rectum near the anal verge presumably reflects known neoplasm. Vascular/Lymphatic: There is abdominal aortic atherosclerosis without aneurysm. Maximum aortic diameter measured at 2.9 cm. There is no gastrohepatic or hepatoduodenal ligament lymphadenopathy. No intraperitoneal or retroperitoneal lymphadenopathy. No pelvic sidewall lymphadenopathy. Specifically, no evidence for perirectal lymphadenopathy. Reproductive: The prostate gland and seminal vesicles have normal imaging features. Other: No intraperitoneal free fluid. Musculoskeletal: Bilateral groin hernias contain only fat. Bone windows reveal no worrisome lytic or sclerotic osseous lesions. IMPRESSION: 1. Prominent soft tissue fullness distal rectum near the anal verge presumably reflects known neoplasm. No evidence for perirectal lymphadenopathy. 2. Multiple ill-defined low-density lesions in the liver, new since prior study of 2014. These have day, ill-defined margins and are highly suspicious for metastatic disease. Dominant lesion measures up to 2.4 cm. 3. Advanced chronic changes in the lungs with areas of marked bullous change in the right upper lobe, some of the bullous containing fluid levels. There is diffuse bronchial wall  thickening, bronchiectasis, and features suggesting underlying UIP. 4.  Aortic Atherosclerois (ICD10-170.0) Electronically Signed   By: Misty Stanley M.D.   On: 12/24/2016 08:25   Dg Chest 2 View  Result Date: 01/01/2017 CLINICAL DATA:  Bradycardia EXAM: CHEST  2 VIEW COMPARISON:  10/06/2016 FINDINGS: Severe chronic interstitial lung disease again noted. Heart is normal size. No effusions. No acute bony abnormality. No definite acute process. IMPRESSION: Severe chronic interstitial lung disease. No definite acute process. Electronically Signed   By: Rolm Baptise M.D.   On: 01/01/2017 17:43   Ct Chest Wo Contrast  Result Date: 12/24/2016 CLINICAL DATA:  Left posterior rectal mass. Remote history of stage III colon cancer. EXAM: CT CHEST, ABDOMEN AND PELVIS WITHOUT CONTRAST TECHNIQUE: Multidetector CT imaging of the chest, abdomen and pelvis  was performed following the standard protocol without IV contrast. COMPARISON:  Abdomen and pelvis CT 05/08/2013 FINDINGS: CT CHEST FINDINGS Cardiovascular: Cardiopericardial silhouette is at upper limits of normal for size. Coronary artery calcification is noted. Atherosclerotic calcification is noted in the wall of the thoracic aorta. Mediastinum/Nodes: Upper normal mediastinal lymph nodes are evident, some of which show dense dystrophic calcification compatible with prior granulomatous disease. No evidence for gross hilar lymphadenopathy although assessment is limited by the lack of intravenous contrast on today's study. The esophagus has normal imaging features. There is no axillary lymphadenopathy. Lungs/Pleura: Fairly advanced changes of underlying chronic interstitial lung disease with areas of centrilobular paraseptal emphysema. There is marked bronchial wall thickening with bilateral cylindrical bronchiectasis. Advanced changes in the right upper lobe show bullous change, septal thickening and areas of dependent fluid within large thin walled air cysts. Scattered  areas of apparent subpleural honeycombing are noted in the lungs bilaterally. Musculoskeletal: Bone windows reveal no worrisome lytic or sclerotic osseous lesions. CT ABDOMEN PELVIS FINDINGS Hepatobiliary: Multiple ill-defined low-density lesions are seen distributed through both hepatic lobes, new since the prior study. Index lesion in the posterior right hepatic dome (image 87 series 2) measures 1.7 cm. A second discrete lesion identified in the posterior right liver (image 98) measures 2.4 cm. Medial segment left liver lesions seen on image 105 measures 1.8 cm. Gallbladder surgically absent. No intrahepatic or extrahepatic biliary dilation. Pancreas: The No focal mass lesion. No dilatation of the main duct. No intraparenchymal cyst. No peripancreatic edema. Spleen: Calcified granulomata. Adrenals/Urinary Tract: No adrenal nodule or mass. Several stones are seen in the right kidney with dense atherosclerotic calcification noted in artery is of the renal hilum. Dominant stone is in the lower pole measuring up to 7 mm maximum diameter without evidence for obstruction. Left kidney also demonstrates multiple stones, the largest of which is nonobstructing in a lower pole calyx and measures 12 x 13 x 12 mm. 2.6 cm lesion in the lower pole of the left kidney approaches water attenuation and is likely a cyst. Other smaller low-density lesions in the left kidney measure about 10 mm and have near water attenuation, also likely cysts. No evidence for hydroureter. The urinary bladder appears normal for the degree of distention. Stomach/Bowel: Stomach is nondistended. No gastric wall thickening. No evidence of outlet obstruction. Duodenum is normally positioned as is the ligament of Treitz. No small bowel wall thickening. No small bowel dilatation. Patient is status post right hemicolectomy with unremarkable appearing ileocolic anastomosis. Diverticular changes are noted in the left colon without evidence of diverticulitis.  Prominent soft tissue fullness in the distal rectum near the anal verge presumably reflects known neoplasm. Vascular/Lymphatic: There is abdominal aortic atherosclerosis without aneurysm. Maximum aortic diameter measured at 2.9 cm. There is no gastrohepatic or hepatoduodenal ligament lymphadenopathy. No intraperitoneal or retroperitoneal lymphadenopathy. No pelvic sidewall lymphadenopathy. Specifically, no evidence for perirectal lymphadenopathy. Reproductive: The prostate gland and seminal vesicles have normal imaging features. Other: No intraperitoneal free fluid. Musculoskeletal: Bilateral groin hernias contain only fat. Bone windows reveal no worrisome lytic or sclerotic osseous lesions. IMPRESSION: 1. Prominent soft tissue fullness distal rectum near the anal verge presumably reflects known neoplasm. No evidence for perirectal lymphadenopathy. 2. Multiple ill-defined low-density lesions in the liver, new since prior study of 2014. These have day, ill-defined margins and are highly suspicious for metastatic disease. Dominant lesion measures up to 2.4 cm. 3. Advanced chronic changes in the lungs with areas of marked bullous change in the right upper  lobe, some of the bullous containing fluid levels. There is diffuse bronchial wall thickening, bronchiectasis, and features suggesting underlying UIP. 4.  Aortic Atherosclerois (ICD10-170.0) Electronically Signed   By: Misty Stanley M.D.   On: 12/24/2016 08:25    Labs:  CBC:  Recent Labs  01/01/17 1600 01/01/17 1634 01/01/17 2227 01/02/17 0645 01/03/17 0151  WBC 6.0  --  7.7 6.9 8.4  HGB 8.1* 8.8* 10.8* 9.9* 10.8*  HCT 23.7* 26.0* 31.5* 29.7* 31.5*  PLT 135*  --  192 172 189    COAGS:  Recent Labs  09/01/16 0057 09/02/16 0359 10/06/16 1229 10/07/16 0444  INR 1.57 1.68 4.09* 1.77    BMP:  Recent Labs  11/18/16 1854  12/28/16 1549 01/01/17 1600 01/01/17 1634 01/02/17 0645 01/03/17 0151  NA 136  < > 135* 136 139 136 135  K 4.2  <  > 4.6 4.0 3.8 3.7 3.9  CL 105  --   --  107 106 105 105  CO2 24  --  24 24  --  24 22  GLUCOSE 161*  < > 188* 146* 134* 108* 123*  BUN 24*  --  20.2 16 16 11 14   CALCIUM 9.3  --  9.3 8.0*  --  8.3* 8.5*  CREATININE 1.51*  --  1.2 1.02 0.90 0.89 0.84  GFRNONAA 39*  --   --  >60  --  >60 >60  GFRAA 45*  --   --  >60  --  >60 >60  < > = values in this interval not displayed.  LIVER FUNCTION TESTS:  Recent Labs  08/31/16 1453 09/02/16 0359 12/28/16 1549 01/01/17 1600  BILITOT 0.8 0.9 0.71 0.7  AST 25 20 23 23   ALT 14* 16* 20 19  ALKPHOS 44 42 109 79  PROT 6.2* 6.4* 7.2 5.9*  ALBUMIN 3.0* 3.1* 3.3* 2.5*    TUMOR MARKERS: No results for input(s): AFPTM, CEA, CA199, CHROMGRNA in the last 8760 hours.  Assessment and Plan: 83 y.o. male with history of colon cancer in 1992, status post right colectomy with chemoradiation. Patient had mesenteric mass recurrence 1993 with resection and chemoradiation. He has now been diagnosed with rectal carcinoma with recent CT revealing:  1. Prominent soft tissue fullness distal rectum near the anal verge presumably reflects known neoplasm. No evidence for perirectal lymphadenopathy. 2. Multiple ill-defined low-density lesions in the liver, new since prior study of 2014. These have day, ill-defined margins and are highly suspicious for metastatic disease. Dominant lesion measures up to 2.4 cm. 3. Advanced chronic changes in the lungs with areas of marked bullous change in the right upper lobe, some of the bullous containing fluid levels. There is diffuse bronchial wall thickening, bronchiectasis, and features suggesting underlying UIP. 4.  Aortic Atherosclerois (ICD10-170.  He presents today for image guided liver lesion biopsy for further evaluation. He was just recently discharged from [facility] 8/28 after syncopal episode.Risks and benefits discussed with the patient/family including, but not limited to bleeding, infection, damage to  adjacent structures or low yield requiring additional tests.All of the patient's questions were answered, patient is agreeable to proceed. Consent signed and in chart. Patient last ate at 0930 this morning therefore he will not receive IV Versed during procedure. Labs pending.      Thank you for this interesting consult.  I greatly enjoyed meeting RODRICKUS MIN and look forward to participating in their care.  A copy of this report was sent to the requesting provider  on this date.  Electronically Signed: D. Rowe Robert, PA-C 01/06/2017, 11:44 AM   I spent a total of 25 minutes  face to face in clinical consultation, greater than 50% of which was counseling/coordinating care for image guided liver lesion biopsy

## 2017-01-06 NOTE — Procedures (Signed)
US guided core biopsy of right hepatic lesion.  3 cores obtained.  Minimal blood loss and no immediate complication.

## 2017-01-06 NOTE — Telephone Encounter (Signed)
Oral Oncology Pharmacist Encounter  Received new prescription for Xeloda for the treatment of metastatic rectal cancer in conjunction with radiation, planned duration 5-6 weeks. Noted recent admission, d/w MD, OK to process Xeloda prescription.  Patient will be counseled to wait to start Xeloda until after he sees Dr. Burr Medico in the office on 01/11/17.  Labs from Epic assessed, Luke for treatment. 01/03/27 EKG shows elevated QTc interval of 505 msec  Current medication list in Epic reviewed, DDI with Xeloda identified:  Xeloda and Ranexa: category B interaction due to both agents ability to possible prolong QTc interval. Patient's baseline QTc elevated as above. Recommend repeat EKG at MD follow-up.  Prescription has been e-scribed to the [facility] for benefits analysis and approval.  Oral Oncology Clinic will continue to follow for insurance authorization, copayment issues, initial counseling and start date.  Johny Drilling, PharmD, BCPS, BCOP 01/06/2017 4:41 PM Oral Oncology Clinic [phone]

## 2017-01-06 NOTE — Discharge Instructions (Signed)
Moderate Conscious Sedation, Adult, Care After °These instructions provide you with information about caring for yourself after your procedure. Your health care provider may also give you more specific instructions. Your treatment has been planned according to current medical practices, but problems sometimes occur. Call your health care provider if you have any problems or questions after your procedure. °What can I expect after the procedure? °After your procedure, it is common: °· To feel sleepy for several hours. °· To feel clumsy and have poor balance for several hours. °· To have poor judgment for several hours. °· To vomit if you eat too soon. ° °Follow these instructions at home: °For at least 24 hours after the procedure: ° °· Do not: °? Participate in activities where you could fall or become injured. °? Drive. °? Use heavy machinery. °? Drink alcohol. °? Take sleeping pills or medicines that cause drowsiness. °? Make important decisions or sign legal documents. °? Take care of children on your own. °· Rest. °Eating and drinking °· Follow the diet recommended by your health care provider. °· If you vomit: °? Drink water, juice, or soup when you can drink without vomiting. °? Make sure you have little or no nausea before eating solid foods. °General instructions °· Have a responsible adult stay with you until you are awake and alert. °· Take over-the-counter and prescription medicines only as told by your health care provider. °· If you smoke, do not smoke without supervision. °· Keep all follow-up visits as told by your health care provider. This is important. °Contact a health care provider if: °· You keep feeling nauseous or you keep vomiting. °· You feel light-headed. °· You develop a rash. °· You have a fever. °Get help right away if: °· You have trouble breathing. °This information is not intended to replace advice given to you by your health care provider. Make sure you discuss any questions you have  with your health care provider. °Document Released: 02/14/2013 Document Revised: 09/29/2015 Document Reviewed: 08/16/2015 °[employer]vier Interactive Patient Education © 2018 [employer] ° ° °Liver Biopsy, Care After °These instructions give you information on caring for yourself after your procedure. Your doctor may also give you more specific instructions. Call your doctor if you have any problems or questions after your procedure. °Follow these instructions at home: °· Rest at home for 1-2 days or as told by your doctor. °· Have someone stay with you for at least 24 hours. °· Do not do these things in the first 24 hours: °? Drive. °? Use machinery. °? Take care of other people. °? Sign legal documents. °? Take a bath or shower. °· There are many different ways to close and cover a cut (incision). For example, a cut can be closed with stitches, skin glue, or adhesive strips. Follow your doctor's instructions on: °? Taking care of your cut. °? Changing and removing your bandage (dressing). °? Removing whatever was used to close your cut. °· Do not drink alcohol in the first week. °· Do not lift more than 5 pounds or play contact sports for the first 2 weeks. °· Take medicines only as told by your doctor. For 1 week, do not take medicine that has aspirin in it or medicines like ibuprofen. °· Get your test results. °Contact a doctor if: °· A cut bleeds and leaves more than just a small spot of blood. °· A cut is red, puffs up (swells), or hurts more than before. °· Fluid or something else   comes from a cut. °· A cut smells bad. °· You have a fever or chills. °Get help right away if: °· You have swelling, bloating, or pain in your belly (abdomen). °· You get dizzy or faint. °· You have a rash. °· You feel sick to your stomach (nauseous) or throw up (vomit). °· You have trouble breathing, feel short of breath, or feel faint. °· Your chest hurts. °· You have problems talking or seeing. °· You have trouble balancing or moving  your arms or legs. °This information is not intended to replace advice given to you by your health care provider. Make sure you discuss any questions you have with your health care provider. °Document Released: 02/03/2008 Document Revised: 10/02/2015 Document Reviewed: 06/22/2013 °Elsevier Interactive Patient Education © 2018 [employer] ° ° °

## 2017-01-07 ENCOUNTER — Ambulatory Visit
Admission: RE | Admit: 2017-01-07 | Discharge: 2017-01-07 | Disposition: A | Discharge status: Home / Self Care | Payer: Medicare Other | Source: Ambulatory Visit | Attending: Radiation Oncology | Admitting: Radiation Oncology

## 2017-01-07 DIAGNOSIS — Z510 Encounter for antineoplastic radiation therapy: Secondary | ICD-10-CM | POA: Diagnosis not present

## 2017-01-07 NOTE — Telephone Encounter (Signed)
Oral Oncology Patient Advocate Encounter  I spoke with [name] this morning about the new prescription for Xeloda.  His out of pocket cost would be $709.53 for the entire quantity prescribed.  He decided not to fill the prescription at this time.  He would like to think it over and discuss his options during his 9-4 visit.    There are no copay assistance grants open for Colon/Rectal cancer at this time.  I will continue to monitor for something to become available.    Fabio Asa. Melynda Keller, [facility] [place] [phone] 01/07/2017 9:43 AM

## 2017-01-11 ENCOUNTER — Other Ambulatory Visit (HOSPITAL_BASED_OUTPATIENT_CLINIC_OR_DEPARTMENT_OTHER): Payer: Medicare Other

## 2017-01-11 ENCOUNTER — Ambulatory Visit
Admission: RE | Admit: 2017-01-11 | Discharge: 2017-01-11 | Disposition: A | Discharge status: Home / Self Care | Payer: Medicare Other | Source: Ambulatory Visit | Attending: Radiation Oncology | Admitting: Radiation Oncology

## 2017-01-11 ENCOUNTER — Ambulatory Visit (HOSPITAL_BASED_OUTPATIENT_CLINIC_OR_DEPARTMENT_OTHER): Payer: Medicare Other | Admitting: Hematology

## 2017-01-11 VITALS — BP 135/54 | HR 66 | Temp 97.7°F | Resp 18 | Ht 69.0 in | Wt 137.9 lb

## 2017-01-11 DIAGNOSIS — N183 Chronic kidney disease, stage 3 (moderate): Secondary | ICD-10-CM | POA: Diagnosis not present

## 2017-01-11 DIAGNOSIS — I2510 Atherosclerotic heart disease of native coronary artery without angina pectoris: Secondary | ICD-10-CM | POA: Diagnosis not present

## 2017-01-11 DIAGNOSIS — C20 Malignant neoplasm of rectum: Secondary | ICD-10-CM

## 2017-01-11 DIAGNOSIS — C772 Secondary and unspecified malignant neoplasm of intra-abdominal lymph nodes: Secondary | ICD-10-CM

## 2017-01-11 DIAGNOSIS — F329 Major depressive disorder, single episode, unspecified: Secondary | ICD-10-CM | POA: Diagnosis not present

## 2017-01-11 DIAGNOSIS — R197 Diarrhea, unspecified: Secondary | ICD-10-CM | POA: Diagnosis not present

## 2017-01-11 DIAGNOSIS — I480 Paroxysmal atrial fibrillation: Secondary | ICD-10-CM

## 2017-01-11 DIAGNOSIS — Z8249 Family history of ischemic heart disease and other diseases of the circulatory system: Secondary | ICD-10-CM | POA: Diagnosis not present

## 2017-01-11 DIAGNOSIS — I10 Essential (primary) hypertension: Secondary | ICD-10-CM

## 2017-01-11 DIAGNOSIS — I130 Hypertensive heart and chronic kidney disease with heart failure and stage 1 through stage 4 chronic kidney disease, or unspecified chronic kidney disease: Secondary | ICD-10-CM | POA: Diagnosis not present

## 2017-01-11 DIAGNOSIS — Z510 Encounter for antineoplastic radiation therapy: Secondary | ICD-10-CM | POA: Diagnosis not present

## 2017-01-11 DIAGNOSIS — Z803 Family history of malignant neoplasm of breast: Secondary | ICD-10-CM | POA: Diagnosis not present

## 2017-01-11 DIAGNOSIS — Z85038 Personal history of other malignant neoplasm of large intestine: Secondary | ICD-10-CM

## 2017-01-11 DIAGNOSIS — C189 Malignant neoplasm of colon, unspecified: Secondary | ICD-10-CM

## 2017-01-11 DIAGNOSIS — I350 Nonrheumatic aortic (valve) stenosis: Secondary | ICD-10-CM | POA: Diagnosis not present

## 2017-01-11 DIAGNOSIS — Z9842 Cataract extraction status, left eye: Secondary | ICD-10-CM | POA: Diagnosis not present

## 2017-01-11 DIAGNOSIS — E785 Hyperlipidemia, unspecified: Secondary | ICD-10-CM | POA: Diagnosis not present

## 2017-01-11 DIAGNOSIS — K769 Liver disease, unspecified: Secondary | ICD-10-CM | POA: Diagnosis not present

## 2017-01-11 DIAGNOSIS — E1122 Type 2 diabetes mellitus with diabetic chronic kidney disease: Secondary | ICD-10-CM | POA: Diagnosis not present

## 2017-01-11 DIAGNOSIS — Z955 Presence of coronary angioplasty implant and graft: Secondary | ICD-10-CM | POA: Diagnosis not present

## 2017-01-11 DIAGNOSIS — Z9841 Cataract extraction status, right eye: Secondary | ICD-10-CM | POA: Diagnosis not present

## 2017-01-11 DIAGNOSIS — Z9221 Personal history of antineoplastic chemotherapy: Secondary | ICD-10-CM | POA: Diagnosis not present

## 2017-01-11 DIAGNOSIS — I5022 Chronic systolic (congestive) heart failure: Secondary | ICD-10-CM | POA: Diagnosis not present

## 2017-01-11 DIAGNOSIS — Z9049 Acquired absence of other specified parts of digestive tract: Secondary | ICD-10-CM | POA: Diagnosis not present

## 2017-01-11 DIAGNOSIS — I252 Old myocardial infarction: Secondary | ICD-10-CM | POA: Diagnosis not present

## 2017-01-11 DIAGNOSIS — Z923 Personal history of irradiation: Secondary | ICD-10-CM | POA: Diagnosis not present

## 2017-01-11 DIAGNOSIS — Z87891 Personal history of nicotine dependence: Secondary | ICD-10-CM | POA: Diagnosis not present

## 2017-01-11 DIAGNOSIS — Z87442 Personal history of urinary calculi: Secondary | ICD-10-CM | POA: Diagnosis not present

## 2017-01-11 LAB — COMPREHENSIVE METABOLIC PANEL
ALT: 23 U/L (ref 0–55)
ANION GAP: 5 meq/L (ref 3–11)
AST: 24 U/L (ref 5–34)
Albumin: 2.8 g/dL — ABNORMAL LOW (ref 3.5–5.0)
Alkaline Phosphatase: 122 U/L (ref 40–150)
BUN: 20.5 mg/dL (ref 7.0–26.0)
CHLORIDE: 104 meq/L (ref 98–109)
CO2: 27 meq/L (ref 22–29)
CREATININE: 1.1 mg/dL (ref 0.7–1.3)
Calcium: 9 mg/dL (ref 8.4–10.4)
EGFR: 56 mL/min/{1.73_m2} — ABNORMAL LOW (ref >90)
GLUCOSE: 256 mg/dL — AB (ref 70–140)
Potassium: 4.1 mEq/L (ref 3.5–5.1)
SODIUM: 136 meq/L (ref 136–145)
Total Bilirubin: 0.57 mg/dL (ref 0.20–1.20)
Total Protein: 6.7 g/dL (ref 6.4–8.3)

## 2017-01-11 LAB — CBC WITH DIFFERENTIAL/PLATELET
BASO%: 0.2 % (ref 0.0–2.0)
Basophils Absolute: 0 10*3/uL (ref 0.0–0.1)
EOS%: 1.8 % (ref 0.0–7.0)
Eosinophils Absolute: 0.1 10*3/uL (ref 0.0–0.5)
HCT: 31.8 % — ABNORMAL LOW (ref 38.4–49.9)
HGB: 10.5 g/dL — ABNORMAL LOW (ref 13.0–17.1)
LYMPH%: 7.6 % — AB (ref 14.0–49.0)
MCH: 35.4 pg — ABNORMAL HIGH (ref 27.2–33.4)
MCHC: 33 g/dL (ref 32.0–36.0)
MCV: 107.1 fL — ABNORMAL HIGH (ref 79.3–98.0)
MONO#: 0.5 10*3/uL (ref 0.1–0.9)
MONO%: 7.5 % (ref 0.0–14.0)
NEUT#: 5.2 10*3/uL (ref 1.5–6.5)
NEUT%: 82.9 % — AB (ref 39.0–75.0)
PLATELETS: 166 10*3/uL (ref 140–400)
RBC: 2.97 10*6/uL — AB (ref 4.20–5.82)
RDW: 13.4 % (ref 11.0–14.6)
WBC: 6.3 10*3/uL (ref 4.0–10.3)
lymph#: 0.5 10*3/uL — ABNORMAL LOW (ref 0.9–3.3)

## 2017-01-11 LAB — CEA (IN HOUSE-CHCC): CEA (CHCC-In House): 4.43 ng/mL (ref 0.00–5.00)

## 2017-01-11 NOTE — Progress Notes (Signed)
  Radiation Oncology         [phone] ________________________________  Name: [name] MRN: [id number]  Date: 12/28/2016  DOB: 05-13-1923    SIMULATION AND TREATMENT PLANNING NOTE  DIAGNOSIS:     ICD-10-CM   1. Rectal cancer ([place]) C20      The patient presented for simulation for the patient's upcoming course of radiation for the diagnosis of rectal cancer. The patient was placed in a supine position. A customized vac-lock bag was constructed to aid in patient immobilization on. This complex treatment device will be used on a daily basis during the treatment. In this fashion a CT scan was obtained through the pelvic region and the isocenter was placed near midline within the pelvis. Surface markings were placed.  The patient's imaging was loaded into the radiation treatment planning system. The patient will initially be planned to receive a course of radiation to a dose of 45 Gy. This will be accomplished in 25 fractions at 1.8 gray per fraction. This initial treatment will correspond to a 3-D conformal technique. The target volume has been contoured in addition to the rectum, bladder and femoral heads. Dose volume histograms of each of these structures have been requested and these will be carefully reviewed as part of the 3-D conformal treatment planning process. To accomplish this initial treatment, 4 customized blocks have been designed for this purpose. Each of these 4 complex treatment devices will be used on a daily basis during the initial course of the treatment. It is anticipated that the patient will then receive a boost for an additional 9 Gy. The anticipated total dose therefore will be 54 Gy.    Special treatment procedure The patient will receive chemotherapy during the course of radiation treatment. The patient may experience increased or overlapping toxicity due to this combined-modality approach and the patient will be monitored for such problems. This may include  extra lab work as necessary. This therefore constitutes a special treatment procedure.    ________________________________  Jodelle Gross, MD, PhD

## 2017-01-11 NOTE — Progress Notes (Signed)
  Radiation Oncology         [phone] ________________________________  Name: [name] MRN: [id number]  Date: 12/28/2016  DOB: [birth year]  Optical Surface Tracking Plan:  Since intensity modulated radiotherapy (IMRT) and 3D conformal radiation treatment methods are predicated on accurate and precise positioning for treatment, intrafraction motion monitoring is medically necessary to ensure accurate and safe treatment delivery.  The ability to quantify intrafraction motion without excessive ionizing radiation dose can only be performed with optical surface tracking. Accordingly, surface imaging offers the opportunity to obtain 3D measurements of patient position throughout IMRT and 3D treatments without excessive radiation exposure.  I am ordering optical surface tracking for this patient's upcoming course of radiotherapy. ________________________________  Kyung Rudd, MD 01/11/2017 7:20 AM    Reference:   Particia Jasper, et al. Surface imaging-based analysis of intrafraction motion for breast radiotherapy patients.Journal of [place], n. 6, nov. 2014. ISSN [id number].   Available at: <http://[email or web address]/index.[email or web address]/article/view/[email or web address]>.

## 2017-01-11 NOTE — Progress Notes (Signed)
[name]  [phone] [phone]  Clinic Follow up Note   Patient Care Team: Lavone Orn, MD as PCP - General (Internal Medicine) Truitt Merle, MD as Consulting Physician (Hematology) Kyung Rudd, MD as Consulting Physician (Radiation Oncology) 01/11/2017  CHIEF COMPLAINTS/PURPOSE OF CONSULTATION:  Follow up for Rectal Cancer  Oncology History   Cancer Staging Rectal cancer [facility]) Staging form: Colon and Rectum, AJCC 8th Edition - Clinical: Stage IIA (cT3, cN0, cM0) - Unsigned       Rectal cancer ([place])   10/06/2016 - 10/09/2016 Hospital Admission    Admit date: 10/06/16 Admission diagnosis: Rectal Bleeding  Additional comments: Has Flexible Sigmoidoscopy      10/07/2016 Pathology Results    Diagnosis 10/07/16 Rectum, biopsy TUBULOVILLOUS ADENOMA WITH FOCAL HIGH GRADE DYSPLASIA      10/07/2016 Procedure    FLEXIBLE SIGMOIDOSCOPY by Dr. Michail Sermon       12/16/2016 Procedure    Dr [name] Performed an EUS in the OR  The patient was identified in the holding area and taken to the procedure room where they were laid in lithotomy position on the exam room table.  The patient was anesthetized per the anesthesia team. The patient was then prepped and draped in the usual sterile fashion. A timeout was performed indicating the correct patient and procedure. A rectal block was performed using Marcaine mixed with epinephrine and lysosomal bupivacaine. I began by performing a digital rectal exam. There was a mass located in the left posterior lateral anal canal clearly involving the sphincter complex. This appeared to originate around the dentate line. The mass was approximately 2 x 2 centimeters. A Tru-Cut needle was used to take core biopsies of the mass to sent to pathology.   The US probe was inserted without difficulty.  The levators were identified and the probe was withdrawn slowly. I identified the prostate and seminal vesicles anteriorly. The internal  sphincter was identified and followed distally. There was a mass noted within the anal canal that traversed the internal and external sphincter complex.  Once this was complete a dressing was applied. The patient was awakened from anesthesia and sent to the postanesthesia care unit in stable condition. All counts were correct per operating room staff.      12/16/2016 Pathology Results    Diagnosis 12/16/16 Anus, biopsy, canal mass - ADENOCARCINOMA - SEE COMMENT Microscopic Comment The biopsy material is limited with a small focus of invasive adenocarcinoma; additional studies are not performed due to exhausted material. Dr. [name] was notified of these results on December 17, 2016. Dr. Gari Crown reviewed the case and agrees with the diagnosis.      12/16/2016 Initial Diagnosis    Rectal cancer ([place])      12/23/2016 Imaging    CT CAP WO Contrast 12/23/16 IMPRESSION: 1. Prominent soft tissue fullness distal rectum near the anal verge presumably reflects known neoplasm. No evidence for perirectal lymphadenopathy. 2. Multiple ill-defined low-density lesions in the liver, new since prior study of 2014. These have day, ill-defined margins and are highly suspicious for metastatic disease. Dominant lesion measures up to 2.4 cm. 3. Advanced chronic changes in the lungs with areas of marked bullous change in the right upper lobe, some of the bullous containing fluid levels. There is diffuse bronchial wall thickening, bronchiectasis, and features suggesting underlying UIP. 4.  Aortic Atherosclerois (ICD10-170.0)      01/05/2017 -  Radiation Therapy    Radiation with Dr. Lisbeth Renshaw  HISTORY OF PRESENTING ILLNESS: 12/28/16  [name] 83 y.o. male is here because of newly diagnosed rectal cancer. He presents to the clinic today with sister and neice.  He was referred by Dr. [name].  He had some constipation and bloody stool. He went to hospital where Dr. Michail Sermon did a sigmoidoscopy 10/07/16  and Dr. [name] had a EUS on 12/16/16.   In the past he had one stent placed. He has MD and HTN and history of colon cancer 25/26 years ago. He did not get anything done after surgery. 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 Mother had breast cancer at old age. He has no children.   Today they have seen Dr. Lisbeth Renshaw for consultation and simulation.  He reports to still have rectal bleeding every time he goes to bathroom with a little blood. His BM are fine with occasional constipation and more diarrhea. He tried MiraLAX. He has lost 20 pounds over the last couple of months. He reports to still eating well. He is not sure why he is losing weight. He is able to physically take care of himself at home. He lives alone and does all his housework and goes grocery shopping. He does not use wheel chair. He has trouble walking and with balance because of shoulder problems. He takes nitro for occassional chest pain, not much lately. He manages his own medication. He was prescribed Oxycodone after last procedure and he has not really taken it.  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    CURRENT THERAPY: Radiation started 01/05/17   INTERVAL HISTORY:  [name] is here for a follow up. He presents to the clinic today his nephew, niece and sister.  He started radiation last week and was admitted to hospital 2 weeks ago, they changed his medication. She had no chest pain or SOB when he was admitted. He had slight SOB yesterday.  Last week he had liver biopsy and denies and pain from it.  He reports to feeling well today and denies pain. His BM is "spasmodic" and difficult at times. He is taking Miralax as needed. It helps him.  Family is waiting to receive phone call about Xeloda. He does not remember receiving call.  He has bruising on his arm and not sure where they came from. He lies by himself and his sister is close to him.  He reports to  getting around and he does have a nurse assisting him through the week. His sister brings him for radiation. He works with his own pill box and gets help from nephew.     MEDICAL HISTORY:  Past Medical History:  Diagnosis Date  . Aortic stenosis, moderate cardiolgosit-  dr Cathie Olden   per last echo 10-15-2016 aortic stenosis somewhat worse since last echo 11-14-2015 moderate calcification and thickened leaflets, valve area 1.01cm^2,  mead gradiant 110mmHg, peak grandiant 68mmHgf  . Barrett esophagus dx 2015  . Chronic stable angina ([place])   . Chronic systolic CHF (congestive heart failure) ([place]) followed by dr Cathie Olden   a. EF previously 35-40%. 03/ 2013 b. improved to 55-65% by cath 08/2013./  per echo 06/ 2018 ef 50-55%  . CKD (chronic kidney disease), stage III   . Constipation   . Coronary artery disease cardiologist-- dr Cathie Olden   a. s/p PCI w/ DES to mLAD 08/05/10. b. NSTEMI 08/2013 (mildly elev trop) - cath showing widely patent stent, 80% prox  D1 (small vessel) with recommendation for medical management, residual 60% ostial PDA, <20% LCx, LVEF 55-65%.   . Depression   . History of colon cancer, stage III oncologist- dr Waymon Budge--  per last note no recurrence 2013   dx 10/ 1992,  Stage III-- treament post right colecotmy, chemoradiation/  1993 localized recurrent mesenteric mass -- treatment post resection mass and chemoradiation  . History of kidney stones   . History of non-ST elevation myocardial infarction (NSTEMI)    08-14-2013  . History of small bowel obstruction    03/ 2005 partial SBO due to adhesions  post lysis adhesions and small bowel resection  . Hyperlipidemia   . Hypertension   . Macrocytic anemia dx 1992   chronic  . PAF (paroxysmal atrial fibrillation) [facility]) cardiologist-  dr Cathie Olden   a. failed TEE due to inability to pass probe into the esophagus in March 2013. b. On Coumadin. Was in NSR 08/2013 admission.  . Premature atrial contractions   . Premature ventricular  contractions   . RBBB (right bundle branch block)   . Rectal mass   . S/P drug eluting coronary stent placement 08/05/2010   DES x1  to midLAD  . Type II diabetes mellitus ([place])   . Wears glasses     SURGICAL HISTORY: Past Surgical History:  Procedure Laterality Date  . ABDOMINAL MASS RESECTION  11/1991   localized recurrent colon cancer mesenteric lymph node mass (area of distal aorta and vena cava) and debulking  . CARDIAC CATHETERIZATION  08/03/10   dr Lia Foyer   preserved LVSF, mod. high grade stenosis LAD, diagonal stenosis at the bend 60-70%  . CARDIAC CATHETERIZATION  08-09-2016  dr Martinique   ostialLAD 30%, wide patent mLAD stent, pCFX 40%,  mild irregularities throughout RCA < 20%  . CARDIAC CATHETERIZATION N/A 05/20/2016   Procedure: Left Heart Cath and Coronary Angiography;  Surgeon: Lorretta Harp, MD;  Location: [facility];  Service: Cardiovascular;  Laterality: N/A; normal LVF,  D2 80%.  ostRCA 50%,  LAD stent patent  . CARDIOVASCULAR STRESS TEST  09-01-2016   dr Cathie Olden   Intermediate risk nuclear study w/ medium non-reversible defect of moderate severity in the basal inferolateral and mid inferolateral location/ no ST segment deviation noted during stress,  nuclear stress ef 48% (45-54%)  . CATARACT EXTRACTION W/ INTRAOCULAR LENS  IMPLANT, BILATERAL    . CORONARY ANGIOPLASTY WITH STENT PLACEMENT  08/05/10   dr Lia Foyer   PCI and DES x1  LAD  . CYSTO/  URETEROSCOPIC STONE EXTRACTION Left 10/18/2002  . CYSTOSCOPY WITH RETROGRADE PYELOGRAM, URETEROSCOPY AND STENT PLACEMENT Bilateral 05/18/2013   Procedure: CYSTOSCOPY WITH BILATERAL RETROGRADE PYELOGRAM, LEFT URETEROSCOPY AND LEFT STENT PLACEMENT;  Surgeon: Alexis Frock, MD;  Location: [facility];  Service: Urology;  Laterality: Bilateral;  . EXPLORATORY LAPAROTOMY W/ LYSIS ADHESIONS AND SMALL BOWEL RESECTION  07/09/2003   partial SBO  . FLEXIBLE SIGMOIDOSCOPY N/A 10/07/2016   Procedure: FLEXIBLE SIGMOIDOSCOPY;  Surgeon:  Wilford Corner, MD;  Location: [facility];  Service: Endoscopy;  Laterality: N/A;  . LAPAROSCOPIC CHOLECYSTECTOMY  12-04-2009  dr Margot Chimes  . LEFT HEART CATHETERIZATION WITH CORONARY ANGIOGRAM N/A 08/15/2013   Procedure: LEFT HEART CATHETERIZATION WITH CORONARY ANGIOGRAM;  Surgeon: Peter M Martinique, MD;  Location: [facility] CATH LAB;  Service: Cardiovascular;  Laterality: N/A; single vessel obstructive CAD involving a small diagonal branch, patent LAD stent , normal LVF  . RIGHT COLECTOMY  03/1991   w/ appendectomy  . TONSILLECTOMY    .  TRANSTHORACIC ECHOCARDIOGRAM  10-15-2016  dr Cathie Olden   grade 1 diastolic dysfunction, ef 69-48%/  moderate AV stenosis (valve area 1.012cm^2, mean gradiant 40mmHg, peak grandiant 60mmHg)/ moderate LAE/  trivial PR/  mild RAE    SOCIAL HISTORY: Social History   Social History  . Marital status: Single    Spouse name: N/A  . Number of children: 0  . Years of education: N/A   Occupational History  . Retired    Social History Main Topics  . Smoking status: Former Smoker    Packs/day: 1.00    Years: 39.00    Types: Cigarettes    Quit date: 05/10/1992  . Smokeless tobacco: Never Used  . Alcohol use 1.2 oz/week    2 Shots of liquor per week     Comment: occasionally.  . Drug use: No  . Sexual activity: No   Other Topics Concern  . Not on file   Social History Narrative   Lives alone.      FAMILY HISTORY: Family History  Problem Relation Age of Onset  . Cancer Mother        breast cancer   . Ulcers Father 73  . Heart attack Brother 54    ALLERGIES:  is allergic to metformin and related.  MEDICATIONS:  Current Outpatient Prescriptions  Medication Sig Dispense Refill  . acetaminophen (TYLENOL) 500 MG tablet Take 1,000 mg by mouth every 6 (six) hours as needed (shoulder pain).    [name] amLODipine (NORVASC) 2.5 MG tablet Take 1 tablet (2.5 mg total) by mouth daily. 90 tablet 3  . aspirin EC 81 MG tablet Take 1 tablet (81 mg total) by mouth daily. 30  tablet 0  . carvedilol (COREG) 3.125 MG tablet Take 1 tablet (3.125 mg total) by mouth 2 (two) times daily with a meal. 60 tablet 0  . donepezil (ARICEPT) 10 MG tablet Take 10 mg by mouth at bedtime.     [name] glimepiride (AMARYL) 1 MG tablet Take 1 mg by mouth daily with breakfast.     . isosorbide mononitrate (IMDUR) 30 MG 24 hr tablet Take 1 tablet (30 mg total) by mouth daily. 30 tablet 0  . linagliptin (TRADJENTA) 5 MG TABS tablet Take 1 tablet (5 mg total) by mouth daily. For blood sugar (Patient taking differently: Take 5 mg by mouth daily. For blood sugar) 30 tablet 1  . nitroGLYCERIN (NITROSTAT) 0.4 MG SL tablet PLACE ONE TABLET UNDER THE TONGUE EVERY 5 MINUTES AS NEEDED FOR CHEST PAIN. 25 tablet 23  . oxyCODONE (OXY IR/ROXICODONE) 5 MG immediate release tablet Take 1 tablet (5 mg total) by mouth every 6 (six) hours as needed. 20 tablet 0  . Propylene Glycol (SYSTANE BALANCE) 0.6 % SOLN Place 1-2 drops into both eyes 3 (three) times daily as needed (for dry eyes).     . ranolazine (RANEXA) 1000 MG SR tablet Take 1 tablet (1,000 mg total) by mouth 2 (two) times daily. (Patient not taking: Reported on 01/11/2017) 60 tablet 0   No current facility-administered medications for this visit.     REVIEW OF SYSTEMS:   Constitutional: Denies fevers, chills or abnormal night sweats (+) weight loss Eyes: Denies blurriness of vision, double vision or watery eyes Ears, nose, mouth, throat, and face: Denies mucositis or sore throat Respiratory: Denies cough, dyspnea or wheezes Cardiovascular: Denies palpitation, chest discomfort or lower extremity swelling (+) occassaional chest pain (+) irregular heartbeat.   Gastrointestinal:  Denies nausea, heartburn (+) slight constipation  Skin: Denies  abnormal skin rashes Lymphatics: Denies new lymphadenopathy or easy bruising Neurological:Denies numbness, tingling or new weaknesses  MSK: (+) shoulder problem  Behavioral/Psych: Mood is stable, no new changes  All  other systems were reviewed with the patient and are negative.   PHYSICAL EXAMINATION: ECOG PERFORMANCE STATUS: 2 - Symptomatic, <50% confined to bed  Vitals:   01/11/17 1159  BP: (!) 135/54  Pulse: 66  Resp: 18  Temp: 97.7 F (36.5 C)  SpO2: 98%   Filed Weights   01/11/17 1159  Weight: 137 lb 14.4 oz (62.6 kg)    GENERAL:alert, no distress and comfortable SKIN: skin color, texture, turgor are normal, no rashes or significant lesions EYES: normal, conjunctiva are pink and non-injected, sclera clear OROPHARYNX:no exudate, no erythema and lips, buccal mucosa, and tongue normal  NECK: supple, thyroid normal size, non-tender, without nodularity LYMPH:  no palpable lymphadenopathy in the cervical, axillary or inguinal LUNGS: clear to auscultation and percussion with normal breathing effort HEART: regular rate & rhythm and no murmurs and no lower extremity edema ABDOMEN:abdomen soft, non-tender and normal bowel sounds Musculoskeletal:no cyanosis of digits and no clubbing  PSYCH: alert & oriented x 3 with fluent speech NEURO: no focal motor/sensory deficits  LABORATORY DATA:  I have reviewed the data as listed CBC Latest Ref Rng & Units 01/11/2017 01/06/2017 01/03/2017  WBC 4.0 - 10.3 10e3/uL 6.3 7.8 8.4  Hemoglobin 13.0 - 17.1 g/dL 10.5(L) 10.0(L) 10.8(L)  Hematocrit 38.4 - 49.9 % 31.8(L) 28.9(L) 31.5(L)  Platelets 140 - 400 10e3/uL 166 184 189    CMP Latest Ref Rng & Units 01/11/2017 01/03/2017 01/02/2017  Glucose 70 - 140 mg/dl 256(H) 123(H) 108(H)  BUN 7.0 - 26.0 mg/dL 20.5 14 11   Creatinine 0.7 - 1.3 mg/dL 1.1 0.84 0.89  Sodium 136 - 145 mEq/L 136 135 136  Potassium 3.5 - 5.1 mEq/L 4.1 3.9 3.7  Chloride 101 - 111 mmol/L - 105 105  CO2 22 - 29 mEq/L 27 22 24   Calcium 8.4 - 10.4 mg/dL 9.0 8.5(L) 8.3(L)  Total Protein 6.4 - 8.3 g/dL 6.7 - -  Total Bilirubin 0.20 - 1.20 mg/dL 0.57 - -  Alkaline Phos 40 - 150 U/L 122 - -  AST 5 - 34 U/L 24 - -  ALT 0 - 55 U/L 23 - -    CEA:   06/14/2006: 1.0 06/11/2008: 1.1 06/10/2009: 1.1 12/16/2009: 1.4 06/11/2010: 1.1 12/28/16: 4.51 01/11/17: PENDING    PATHOLOGY  01/06/17 Liver Biopsy PENDING    Diagnosis 12/16/16 Anus, biopsy, canal mass - ADENOCARCINOMA - SEE COMMENT Microscopic Comment The biopsy material is limited with a small focus of invasive adenocarcinoma; additional studies are not performed due to exhausted material. Dr. [name] was notified of these results on December 17, 2016. Dr. Gari Crown reviewed the case and agrees with the diagnosis.   Diagnosis 10/07/16 Rectum, biopsy TUBULOVILLOUS ADENOMA WITH FOCAL HIGH GRADE DYSPLASIA   PROCEDURES  ECHO 10/15/16 Study Conclusions - Left ventricle: The cavity size was normal. Wall thickness was   normal. Systolic function was normal. The estimated ejection   fraction was in the range of 50% to 55%. Wall motion was normal;   there were no regional wall motion abnormalities. Doppler   parameters are consistent with abnormal left ventricular   relaxation (grade 1 diastolic dysfunction). - Aortic valve: Moderately calcified annulus. Moderately thickened,   moderately calcified leaflets. There was moderate stenosis. Valve   area (VTI): 1.01 cm^2. Valve area (Vmax): 1.04 cm^2. Valve area   (  Vmean): 0.97 cm^2. - Left atrium: The atrium was moderately dilated. - Right atrium: The atrium was mildly dilated.  Flexible Sigmoidectomy 10/07/16 IMPRESSION - Firm lesion in anorectum with bleeding following DRE found on perianal exam. - Likely malignant tumor in the distal rectum. Biopsied. - Internal hemorrhoids. - Anal papilla(e) were hypertrophied. - Stool in the rectum, in the recto-sigmoid colon and in the sigmoid colon.    RADIOGRAPHIC STUDIES: I have personally reviewed the radiological images as listed and agreed with the findings in the report. Ct Abdomen Pelvis Wo Contrast  Result Date: 12/24/2016 CLINICAL DATA:  Left posterior rectal mass. Remote history of stage  III colon cancer. EXAM: CT CHEST, ABDOMEN AND PELVIS WITHOUT CONTRAST TECHNIQUE: Multidetector CT imaging of the chest, abdomen and pelvis was performed following the standard protocol without IV contrast. COMPARISON:  Abdomen and pelvis CT 05/08/2013 FINDINGS: CT CHEST FINDINGS Cardiovascular: Cardiopericardial silhouette is at upper limits of normal for size. Coronary artery calcification is noted. Atherosclerotic calcification is noted in the wall of the thoracic aorta. Mediastinum/Nodes: Upper normal mediastinal lymph nodes are evident, some of which show dense dystrophic calcification compatible with prior granulomatous disease. No evidence for gross hilar lymphadenopathy although assessment is limited by the lack of intravenous contrast on today's study. The esophagus has normal imaging features. There is no axillary lymphadenopathy. Lungs/Pleura: Fairly advanced changes of underlying chronic interstitial lung disease with areas of centrilobular paraseptal emphysema. There is marked bronchial wall thickening with bilateral cylindrical bronchiectasis. Advanced changes in the right upper lobe show bullous change, septal thickening and areas of dependent fluid within large thin walled air cysts. Scattered areas of apparent subpleural honeycombing are noted in the lungs bilaterally. Musculoskeletal: Bone windows reveal no worrisome lytic or sclerotic osseous lesions. CT ABDOMEN PELVIS FINDINGS Hepatobiliary: Multiple ill-defined low-density lesions are seen distributed through both hepatic lobes, new since the prior study. Index lesion in the posterior right hepatic dome (image 87 series 2) measures 1.7 cm. A second discrete lesion identified in the posterior right liver (image 98) measures 2.4 cm. Medial segment left liver lesions seen on image 105 measures 1.8 cm. Gallbladder surgically absent. No intrahepatic or extrahepatic biliary dilation. Pancreas: The No focal mass lesion. No dilatation of the main duct.  No intraparenchymal cyst. No peripancreatic edema. Spleen: Calcified granulomata. Adrenals/Urinary Tract: No adrenal nodule or mass. Several stones are seen in the right kidney with dense atherosclerotic calcification noted in artery is of the renal hilum. Dominant stone is in the lower pole measuring up to 7 mm maximum diameter without evidence for obstruction. Left kidney also demonstrates multiple stones, the largest of which is nonobstructing in a lower pole calyx and measures 12 x 13 x 12 mm. 2.6 cm lesion in the lower pole of the left kidney approaches water attenuation and is likely a cyst. Other smaller low-density lesions in the left kidney measure about 10 mm and have near water attenuation, also likely cysts. No evidence for hydroureter. The urinary bladder appears normal for the degree of distention. Stomach/Bowel: Stomach is nondistended. No gastric wall thickening. No evidence of outlet obstruction. Duodenum is normally positioned as is the ligament of Treitz. No small bowel wall thickening. No small bowel dilatation. Patient is status post right hemicolectomy with unremarkable appearing ileocolic anastomosis. Diverticular changes are noted in the left colon without evidence of diverticulitis. Prominent soft tissue fullness in the distal rectum near the anal verge presumably reflects known neoplasm. Vascular/Lymphatic: There is abdominal aortic atherosclerosis without aneurysm. Maximum aortic diameter  measured at 2.9 cm. There is no gastrohepatic or hepatoduodenal ligament lymphadenopathy. No intraperitoneal or retroperitoneal lymphadenopathy. No pelvic sidewall lymphadenopathy. Specifically, no evidence for perirectal lymphadenopathy. Reproductive: The prostate gland and seminal vesicles have normal imaging features. Other: No intraperitoneal free fluid. Musculoskeletal: Bilateral groin hernias contain only fat. Bone windows reveal no worrisome lytic or sclerotic osseous lesions. IMPRESSION: 1.  Prominent soft tissue fullness distal rectum near the anal verge presumably reflects known neoplasm. No evidence for perirectal lymphadenopathy. 2. Multiple ill-defined low-density lesions in the liver, new since prior study of 2014. These have day, ill-defined margins and are highly suspicious for metastatic disease. Dominant lesion measures up to 2.4 cm. 3. Advanced chronic changes in the lungs with areas of marked bullous change in the right upper lobe, some of the bullous containing fluid levels. There is diffuse bronchial wall thickening, bronchiectasis, and features suggesting underlying UIP. 4.  Aortic Atherosclerois (ICD10-170.0) Electronically Signed   By: Misty Stanley M.D.   On: 12/24/2016 08:25   Dg Chest 2 View  Result Date: 01/01/2017 CLINICAL DATA:  Bradycardia EXAM: CHEST  2 VIEW COMPARISON:  10/06/2016 FINDINGS: Severe chronic interstitial lung disease again noted. Heart is normal size. No effusions. No acute bony abnormality. No definite acute process. IMPRESSION: Severe chronic interstitial lung disease. No definite acute process. Electronically Signed   By: Rolm Baptise M.D.   On: 01/01/2017 17:43   Ct Chest Wo Contrast  Result Date: 12/24/2016 CLINICAL DATA:  Left posterior rectal mass. Remote history of stage III colon cancer. EXAM: CT CHEST, ABDOMEN AND PELVIS WITHOUT CONTRAST TECHNIQUE: Multidetector CT imaging of the chest, abdomen and pelvis was performed following the standard protocol without IV contrast. COMPARISON:  Abdomen and pelvis CT 05/08/2013 FINDINGS: CT CHEST FINDINGS Cardiovascular: Cardiopericardial silhouette is at upper limits of normal for size. Coronary artery calcification is noted. Atherosclerotic calcification is noted in the wall of the thoracic aorta. Mediastinum/Nodes: Upper normal mediastinal lymph nodes are evident, some of which show dense dystrophic calcification compatible with prior granulomatous disease. No evidence for gross hilar lymphadenopathy  although assessment is limited by the lack of intravenous contrast on today's study. The esophagus has normal imaging features. There is no axillary lymphadenopathy. Lungs/Pleura: Fairly advanced changes of underlying chronic interstitial lung disease with areas of centrilobular paraseptal emphysema. There is marked bronchial wall thickening with bilateral cylindrical bronchiectasis. Advanced changes in the right upper lobe show bullous change, septal thickening and areas of dependent fluid within large thin walled air cysts. Scattered areas of apparent subpleural honeycombing are noted in the lungs bilaterally. Musculoskeletal: Bone windows reveal no worrisome lytic or sclerotic osseous lesions. CT ABDOMEN PELVIS FINDINGS Hepatobiliary: Multiple ill-defined low-density lesions are seen distributed through both hepatic lobes, new since the prior study. Index lesion in the posterior right hepatic dome (image 87 series 2) measures 1.7 cm. A second discrete lesion identified in the posterior right liver (image 98) measures 2.4 cm. Medial segment left liver lesions seen on image 105 measures 1.8 cm. Gallbladder surgically absent. No intrahepatic or extrahepatic biliary dilation. Pancreas: The No focal mass lesion. No dilatation of the main duct. No intraparenchymal cyst. No peripancreatic edema. Spleen: Calcified granulomata. Adrenals/Urinary Tract: No adrenal nodule or mass. Several stones are seen in the right kidney with dense atherosclerotic calcification noted in artery is of the renal hilum. Dominant stone is in the lower pole measuring up to 7 mm maximum diameter without evidence for obstruction. Left kidney also demonstrates multiple stones, the largest of which is  nonobstructing in a lower pole calyx and measures 12 x 13 x 12 mm. 2.6 cm lesion in the lower pole of the left kidney approaches water attenuation and is likely a cyst. Other smaller low-density lesions in the left kidney measure about 10 mm and have  near water attenuation, also likely cysts. No evidence for hydroureter. The urinary bladder appears normal for the degree of distention. Stomach/Bowel: Stomach is nondistended. No gastric wall thickening. No evidence of outlet obstruction. Duodenum is normally positioned as is the ligament of Treitz. No small bowel wall thickening. No small bowel dilatation. Patient is status post right hemicolectomy with unremarkable appearing ileocolic anastomosis. Diverticular changes are noted in the left colon without evidence of diverticulitis. Prominent soft tissue fullness in the distal rectum near the anal verge presumably reflects known neoplasm. Vascular/Lymphatic: There is abdominal aortic atherosclerosis without aneurysm. Maximum aortic diameter measured at 2.9 cm. There is no gastrohepatic or hepatoduodenal ligament lymphadenopathy. No intraperitoneal or retroperitoneal lymphadenopathy. No pelvic sidewall lymphadenopathy. Specifically, no evidence for perirectal lymphadenopathy. Reproductive: The prostate gland and seminal vesicles have normal imaging features. Other: No intraperitoneal free fluid. Musculoskeletal: Bilateral groin hernias contain only fat. Bone windows reveal no worrisome lytic or sclerotic osseous lesions. IMPRESSION: 1. Prominent soft tissue fullness distal rectum near the anal verge presumably reflects known neoplasm. No evidence for perirectal lymphadenopathy. 2. Multiple ill-defined low-density lesions in the liver, new since prior study of 2014. These have day, ill-defined margins and are highly suspicious for metastatic disease. Dominant lesion measures up to 2.4 cm. 3. Advanced chronic changes in the lungs with areas of marked bullous change in the right upper lobe, some of the bullous containing fluid levels. There is diffuse bronchial wall thickening, bronchiectasis, and features suggesting underlying UIP. 4.  Aortic Atherosclerois (ICD10-170.0) Electronically Signed   By: Misty Stanley M.D.    On: 12/24/2016 08:25   US Biopsy  Result Date: 01/06/2017 INDICATION: Rectal cancer and liver lesions. EXAM: ULTRASOUND-GUIDED LIVER LESION BIOPSY MEDICATIONS: None. ANESTHESIA/SEDATION: Fentanyl 50 mcg The patient's level of consciousness and vital signs were monitored continuously by radiology nursing throughout the procedure under my direct supervision. FLUOROSCOPY TIME:  None COMPLICATIONS: None immediate. PROCEDURE: Informed written consent was obtained from the patient after a thorough discussion of the procedural risks, benefits and alternatives. All questions were addressed. A timeout was performed prior to the initiation of the procedure. Liver was evaluated with ultrasound. A lesion in the right hepatic lobe was selected for biopsy. Right side of the abdomen was prepped and draped in a sterile fashion. Skin and soft tissues were anesthetized with 1% lidocaine. 17 gauge coaxial needle was directed into right hepatic lesion with ultrasound guidance. Three core biopsies obtained with an 18 gauge core device. Specimens placed in formalin. 17 gauge needle was removed without complication. Bandage placed over the puncture site. FINDINGS: Small isoechoic lesions in the right hepatic lobe. 2 cm lesion was biopsied. No significant bleeding or hematoma formation after the core biopsies. IMPRESSION: Successful ultrasound-guided core biopsies of a right hepatic lesion. Electronically Signed   By: Markus Daft M.D.   On: 01/06/2017 14:05    ASSESSMENT & PLAN:  [name] is a 83 y.o. caucasian male with a history of CHF, Kidney Stones, HTN, type 2 DM, HLD. and MI.    1. Rectal Cancer, Adenocarcinoma, Stage IV (cT3,cN0,cM1) -We discussed his EUS, CT scans and biopsy results in great details with pt and his family members.  -Unfortunately his CT scan reviewed multiple  liver lesions, highly suspicious for liver metastasis from his rectal cancer. I recommend liver biopsy to confirm the metastasis.  Patient is agreeable. I'll set up with interventional radiology for biopsy. -Patient is symptomatically from his rectal cancer, I recommend palliative radiation. He has been seen by Dr. Lisbeth Renshaw today, he has started -01/06/17 Liver biopsy results show evidence of cancer in his liver, it has not been signed off yet, IHC studies oending, I spoke with pathologist today.  -I will obtain [employer] genomic test to see if he is a candidate for immunotherapy or targeted therapy.  -due to his high copay for xeloda and palliative treatment approach, I recommend him to have radiation first, no concurrent Xeloda -I will see him back after radiation to discuss systemic therapy options  -Labs reviewed and Hg is 10.5, I suggest he can take oral iron once a day. No need for IV iron currently. His sugar is 256 today, I suggest he keeps taking his glimepiride and tradjenta and watch his det.  -F/u in 4 weeks  -He knows to call if any new symptoms such as pain occurs in abdomen.    2. Hx of recurrent Colon Cancer, 25 years ago -Surgery with no chemo or radiation.  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    3. Diarrhea/Constipation -Secondary to his rectal cancer. He also has rectal bleeding, mild.  -For Constipation I suggest he use Colase but once he is able to move bowel he should back off so her does not have diarrhea.   4. Goal of care discussion  -We again discussed the incurable nature of his cancer, and the overall poor prognosis, especially if he    does not have good response to chemotherapy or progress on chemo -The patient understands the goal of care is palliative. -I recommend DNR/DNI, she will think about it   PLAN:  -Lab and f/u in 4-5 weeks before he completes radiation    No orders of the defined types were placed in this encounter.   All questions were answered. The patient knows to call the clinic with any problems, questions  or concerns. I spent 25 minutes counseling the patient face to face. The total time spent in the appointment was 30 minutes and more than 50% was on counseling.  This document serves as a record of services personally performed by Truitt Merle, MD. It was created on her behalf by Joslyn Devon, a trained medical scribe. The creation of this record is based on the scribe's personal observations and the provider's statements to them. This document has been checked and approved by the attending provider.     Truitt Merle, MD 01/11/2017

## 2017-01-12 ENCOUNTER — Telehealth: Payer: Self-pay | Admitting: Hematology

## 2017-01-12 ENCOUNTER — Ambulatory Visit
Admission: RE | Admit: 2017-01-12 | Discharge: 2017-01-12 | Disposition: A | Discharge status: Home / Self Care | Payer: Medicare Other | Source: Ambulatory Visit | Attending: Radiation Oncology | Admitting: Radiation Oncology

## 2017-01-12 ENCOUNTER — Encounter: Payer: Self-pay | Admitting: Hematology

## 2017-01-12 DIAGNOSIS — Z510 Encounter for antineoplastic radiation therapy: Secondary | ICD-10-CM | POA: Diagnosis not present

## 2017-01-12 NOTE — Telephone Encounter (Signed)
appt per 9/4 los - already scheduled - per MD okay to cancel weekly lab and f/u pt not getting chemo just radiation at the moment - gave patient an updated schedule.

## 2017-01-13 ENCOUNTER — Ambulatory Visit
Admission: RE | Admit: 2017-01-13 | Discharge: 2017-01-13 | Disposition: A | Discharge status: Home / Self Care | Payer: Medicare Other | Source: Ambulatory Visit | Attending: Radiation Oncology | Admitting: Radiation Oncology

## 2017-01-14 ENCOUNTER — Ambulatory Visit
Admission: RE | Admit: 2017-01-14 | Discharge: 2017-01-14 | Disposition: A | Discharge status: Home / Self Care | Payer: Medicare Other | Source: Ambulatory Visit | Attending: Radiation Oncology | Admitting: Radiation Oncology

## 2017-01-14 ENCOUNTER — Inpatient Hospital Stay
Admission: RE | Admit: 2017-01-14 | Discharge: 2017-01-14 | Disposition: A | Discharge status: Home / Self Care | Payer: Self-pay | Source: Ambulatory Visit | Attending: Radiation Oncology | Admitting: Radiation Oncology

## 2017-01-14 NOTE — Progress Notes (Signed)
Pt here for patient teaching.  Pt given Radiation and You booklet.  Reviewed areas of pertinence such as diarrhea, fatigue, hair loss, sexual and fertility changes and skin changes . Pt able to give teach back of to pat skin, use unscented/gentle soap, use baby wipes, have Imodium on hand, drink plenty of water and sitz bath,avoid applying anything to skin within 4 hours of treatment. Pt verbalizes understanding of information given and will contact nursing with any questions or concerns.     [email or web address]

## 2017-01-15 ENCOUNTER — Emergency Department (HOSPITAL_COMMUNITY): Payer: Medicare Other

## 2017-01-15 ENCOUNTER — Encounter (HOSPITAL_COMMUNITY): Payer: Self-pay | Admitting: Emergency Medicine

## 2017-01-15 ENCOUNTER — Inpatient Hospital Stay (HOSPITAL_COMMUNITY)
Admission: EM | Admit: 2017-01-15 | Discharge: 2017-01-17 | DRG: 194 | Disposition: A | Discharge status: Home / Self Care | Payer: Medicare Other | Attending: Internal Medicine | Admitting: Internal Medicine

## 2017-01-15 DIAGNOSIS — I4510 Unspecified right bundle-branch block: Secondary | ICD-10-CM | POA: Diagnosis present

## 2017-01-15 DIAGNOSIS — I350 Nonrheumatic aortic (valve) stenosis: Secondary | ICD-10-CM

## 2017-01-15 DIAGNOSIS — J181 Lobar pneumonia, unspecified organism: Secondary | ICD-10-CM

## 2017-01-15 DIAGNOSIS — Z803 Family history of malignant neoplasm of breast: Secondary | ICD-10-CM

## 2017-01-15 DIAGNOSIS — Z9842 Cataract extraction status, left eye: Secondary | ICD-10-CM

## 2017-01-15 DIAGNOSIS — C20 Malignant neoplasm of rectum: Secondary | ICD-10-CM | POA: Diagnosis present

## 2017-01-15 DIAGNOSIS — I480 Paroxysmal atrial fibrillation: Secondary | ICD-10-CM | POA: Diagnosis not present

## 2017-01-15 DIAGNOSIS — Z87891 Personal history of nicotine dependence: Secondary | ICD-10-CM

## 2017-01-15 DIAGNOSIS — Z87442 Personal history of urinary calculi: Secondary | ICD-10-CM

## 2017-01-15 DIAGNOSIS — F0390 Unspecified dementia without behavioral disturbance: Secondary | ICD-10-CM | POA: Diagnosis present

## 2017-01-15 DIAGNOSIS — Z961 Presence of intraocular lens: Secondary | ICD-10-CM | POA: Diagnosis present

## 2017-01-15 DIAGNOSIS — J439 Emphysema, unspecified: Secondary | ICD-10-CM

## 2017-01-15 DIAGNOSIS — R0902 Hypoxemia: Secondary | ICD-10-CM

## 2017-01-15 DIAGNOSIS — Z79899 Other long term (current) drug therapy: Secondary | ICD-10-CM

## 2017-01-15 DIAGNOSIS — K769 Liver disease, unspecified: Secondary | ICD-10-CM | POA: Diagnosis present

## 2017-01-15 DIAGNOSIS — I5022 Chronic systolic (congestive) heart failure: Secondary | ICD-10-CM | POA: Diagnosis present

## 2017-01-15 DIAGNOSIS — J470 Bronchiectasis with acute lower respiratory infection: Secondary | ICD-10-CM | POA: Diagnosis present

## 2017-01-15 DIAGNOSIS — I2510 Atherosclerotic heart disease of native coronary artery without angina pectoris: Secondary | ICD-10-CM | POA: Diagnosis present

## 2017-01-15 DIAGNOSIS — K219 Gastro-esophageal reflux disease without esophagitis: Secondary | ICD-10-CM | POA: Diagnosis present

## 2017-01-15 DIAGNOSIS — Z9841 Cataract extraction status, right eye: Secondary | ICD-10-CM

## 2017-01-15 DIAGNOSIS — Z9049 Acquired absence of other specified parts of digestive tract: Secondary | ICD-10-CM

## 2017-01-15 DIAGNOSIS — D539 Nutritional anemia, unspecified: Secondary | ICD-10-CM | POA: Diagnosis present

## 2017-01-15 DIAGNOSIS — J189 Pneumonia, unspecified organism: Secondary | ICD-10-CM | POA: Diagnosis not present

## 2017-01-15 DIAGNOSIS — R072 Precordial pain: Secondary | ICD-10-CM | POA: Diagnosis not present

## 2017-01-15 DIAGNOSIS — I5020 Unspecified systolic (congestive) heart failure: Secondary | ICD-10-CM | POA: Diagnosis present

## 2017-01-15 DIAGNOSIS — Y95 Nosocomial condition: Secondary | ICD-10-CM | POA: Diagnosis present

## 2017-01-15 DIAGNOSIS — R911 Solitary pulmonary nodule: Secondary | ICD-10-CM | POA: Diagnosis present

## 2017-01-15 DIAGNOSIS — Z79891 Long term (current) use of opiate analgesic: Secondary | ICD-10-CM

## 2017-01-15 DIAGNOSIS — Z955 Presence of coronary angioplasty implant and graft: Secondary | ICD-10-CM

## 2017-01-15 DIAGNOSIS — K2270 Barrett's esophagus without dysplasia: Secondary | ICD-10-CM | POA: Diagnosis present

## 2017-01-15 DIAGNOSIS — I252 Old myocardial infarction: Secondary | ICD-10-CM

## 2017-01-15 DIAGNOSIS — E785 Hyperlipidemia, unspecified: Secondary | ICD-10-CM | POA: Diagnosis present

## 2017-01-15 DIAGNOSIS — E1122 Type 2 diabetes mellitus with diabetic chronic kidney disease: Secondary | ICD-10-CM | POA: Diagnosis present

## 2017-01-15 DIAGNOSIS — I130 Hypertensive heart and chronic kidney disease with heart failure and stage 1 through stage 4 chronic kidney disease, or unspecified chronic kidney disease: Secondary | ICD-10-CM | POA: Diagnosis present

## 2017-01-15 DIAGNOSIS — Z7982 Long term (current) use of aspirin: Secondary | ICD-10-CM

## 2017-01-15 DIAGNOSIS — Z8249 Family history of ischemic heart disease and other diseases of the circulatory system: Secondary | ICD-10-CM

## 2017-01-15 DIAGNOSIS — N183 Chronic kidney disease, stage 3 (moderate): Secondary | ICD-10-CM | POA: Diagnosis present

## 2017-01-15 DIAGNOSIS — Z888 Allergy status to other drugs, medicaments and biological substances status: Secondary | ICD-10-CM

## 2017-01-15 DIAGNOSIS — Z973 Presence of spectacles and contact lenses: Secondary | ICD-10-CM

## 2017-01-15 LAB — BASIC METABOLIC PANEL
ANION GAP: 8 (ref 5–15)
BUN: 17 mg/dL (ref 6–20)
CHLORIDE: 103 mmol/L (ref 101–111)
CO2: 23 mmol/L (ref 22–32)
Calcium: 8.6 mg/dL — ABNORMAL LOW (ref 8.9–10.3)
Creatinine, Ser: 1.02 mg/dL (ref 0.61–1.24)
GFR calc Af Amer: >60 mL/min (ref >60)
GFR calc non Af Amer: >60 mL/min (ref >60)
GLUCOSE: 151 mg/dL — AB (ref 65–99)
POTASSIUM: 4.1 mmol/L (ref 3.5–5.1)
SODIUM: 134 mmol/L — AB (ref 135–145)

## 2017-01-15 LAB — I-STAT TROPONIN, ED: Troponin i, poc: 0.02 ng/mL (ref 0.00–0.08)

## 2017-01-15 LAB — D-DIMER, QUANTITATIVE (NOT AT ARMC): D DIMER QUANT: 3.55 ug{FEU}/mL — AB (ref 0.00–0.50)

## 2017-01-15 LAB — CBC
HEMATOCRIT: 31.1 % — AB (ref 39.0–52.0)
HEMOGLOBIN: 10.7 g/dL — AB (ref 13.0–17.0)
MCH: 35.5 pg — ABNORMAL HIGH (ref 26.0–34.0)
MCHC: 34.4 g/dL (ref 30.0–36.0)
MCV: 103.3 fL — AB (ref 78.0–100.0)
Platelets: 155 10*3/uL (ref 150–400)
RBC: 3.01 MIL/uL — ABNORMAL LOW (ref 4.22–5.81)
RDW: 13.3 % (ref 11.5–15.5)
WBC: 5.5 10*3/uL (ref 4.0–10.5)

## 2017-01-15 LAB — GLUCOSE, CAPILLARY: GLUCOSE-CAPILLARY: 176 mg/dL — AB (ref 65–99)

## 2017-01-15 MED ORDER — CARVEDILOL 3.125 MG PO TABS
3.1250 mg | ORAL_TABLET | Freq: Two times a day (BID) | ORAL | Status: DC
  Administered 2017-01-16 – 2017-01-17 (×3): 3.125 mg via ORAL
  Filled 2017-01-15 (×3): qty 1

## 2017-01-15 MED ORDER — DEXTROSE 5 % IV SOLN
1.0000 g | INTRAVENOUS | Status: DC
  Administered 2017-01-16: 1 g via INTRAVENOUS
  Filled 2017-01-15: qty 10

## 2017-01-15 MED ORDER — AMLODIPINE BESYLATE 2.5 MG PO TABS
2.5000 mg | ORAL_TABLET | Freq: Every day | ORAL | Status: DC
  Administered 2017-01-15 – 2017-01-17 (×3): 2.5 mg via ORAL
  Filled 2017-01-15 (×3): qty 1

## 2017-01-15 MED ORDER — ATORVASTATIN CALCIUM 40 MG PO TABS
40.0000 mg | ORAL_TABLET | Freq: Every day | ORAL | Status: DC
  Administered 2017-01-15 – 2017-01-17 (×3): 40 mg via ORAL
  Filled 2017-01-15 (×3): qty 1

## 2017-01-15 MED ORDER — B COMPLEX PO TABS: 1.0000 | ORAL_TABLET | Freq: Every day | ORAL | Status: DC

## 2017-01-15 MED ORDER — DONEPEZIL HCL 10 MG PO TABS
10.0000 mg | ORAL_TABLET | Freq: Every day | ORAL | Status: DC
Start: 2017-01-15 — End: 2017-01-17
  Administered 2017-01-15 – 2017-01-16 (×2): 10 mg via ORAL
  Filled 2017-01-15 (×2): qty 1

## 2017-01-15 MED ORDER — ASPIRIN EC 81 MG PO TBEC
81.0000 mg | DELAYED_RELEASE_TABLET | Freq: Every day | ORAL | Status: DC
  Administered 2017-01-15 – 2017-01-17 (×3): 81 mg via ORAL
  Filled 2017-01-15 (×3): qty 1

## 2017-01-15 MED ORDER — RANOLAZINE ER 500 MG PO TB12
1000.0000 mg | ORAL_TABLET | Freq: Two times a day (BID) | ORAL | Status: DC
  Administered 2017-01-15 – 2017-01-17 (×4): 1000 mg via ORAL
  Filled 2017-01-15 (×4): qty 2

## 2017-01-15 MED ORDER — POLYVINYL ALCOHOL 1.4 % OP SOLN
1.0000 [drp] | Freq: Three times a day (TID) | OPHTHALMIC | Status: DC | PRN
  Administered 2017-01-15: 1 [drp] via OPHTHALMIC
  Filled 2017-01-15: qty 15

## 2017-01-15 MED ORDER — B COMPLEX-C PO TABS
1.0000 | ORAL_TABLET | Freq: Every day | ORAL | Status: DC
  Administered 2017-01-15 – 2017-01-17 (×3): 1 via ORAL
  Filled 2017-01-15 (×3): qty 1

## 2017-01-15 MED ORDER — ISOSORBIDE MONONITRATE ER 30 MG PO TB24
30.0000 mg | ORAL_TABLET | Freq: Every day | ORAL | Status: DC
Start: 2017-01-15 — End: 2017-01-17
  Administered 2017-01-15 – 2017-01-17 (×3): 30 mg via ORAL
  Filled 2017-01-15 (×3): qty 1

## 2017-01-15 MED ORDER — ACETAMINOPHEN 500 MG PO TABS: 1000.0000 mg | ORAL_TABLET | Freq: Four times a day (QID) | ORAL | Status: DC | PRN

## 2017-01-15 MED ORDER — DEXTROSE 5 % IV SOLN
500.0000 mg | Freq: Once | INTRAVENOUS | Status: AC
  Administered 2017-01-15: 500 mg via INTRAVENOUS
  Filled 2017-01-15: qty 500

## 2017-01-15 MED ORDER — FINASTERIDE 5 MG PO TABS
5.0000 mg | ORAL_TABLET | Freq: Every day | ORAL | Status: DC
  Administered 2017-01-15 – 2017-01-16 (×2): 5 mg via ORAL
  Filled 2017-01-15 (×2): qty 1

## 2017-01-15 MED ORDER — DEXTROSE 5 % IV SOLN
1.0000 g | Freq: Once | INTRAVENOUS | Status: AC
  Administered 2017-01-15: 1 g via INTRAVENOUS
  Filled 2017-01-15: qty 10

## 2017-01-15 MED ORDER — PANTOPRAZOLE SODIUM 40 MG PO TBEC
40.0000 mg | DELAYED_RELEASE_TABLET | Freq: Every day | ORAL | Status: DC
  Administered 2017-01-15 – 2017-01-17 (×3): 40 mg via ORAL
  Filled 2017-01-15 (×3): qty 1

## 2017-01-15 MED ORDER — AZITHROMYCIN 500 MG PO TABS
250.0000 mg | ORAL_TABLET | ORAL | Status: DC
  Administered 2017-01-16: 250 mg via ORAL
  Filled 2017-01-15: qty 1
  Filled 2017-01-15: qty 0.5
  Filled 2017-01-15: qty 1

## 2017-01-15 MED ORDER — ENOXAPARIN SODIUM 40 MG/0.4ML ~~LOC~~ SOLN
40.0000 mg | SUBCUTANEOUS | Status: DC
  Administered 2017-01-15 – 2017-01-16 (×2): 40 mg via SUBCUTANEOUS
  Filled 2017-01-15 (×2): qty 0.4

## 2017-01-15 MED ORDER — INSULIN ASPART 100 UNIT/ML ~~LOC~~ SOLN
0.0000 [IU] | Freq: Three times a day (TID) | SUBCUTANEOUS | Status: DC
  Administered 2017-01-16 – 2017-01-17 (×3): 2 [IU] via SUBCUTANEOUS

## 2017-01-15 MED ORDER — IOPAMIDOL (ISOVUE-370) INJECTION 76%
INTRAVENOUS | Status: AC
  Administered 2017-01-15: 100 mL via INTRAVENOUS
  Filled 2017-01-15: qty 100

## 2017-01-15 MED ORDER — PROPYLENE GLYCOL 0.6 % OP SOLN: 1.0000 [drp] | Freq: Three times a day (TID) | OPHTHALMIC | Status: DC | PRN

## 2017-01-15 NOTE — Progress Notes (Signed)
[name] [id number] Admitted to 7T06: 01/15/2017 8:42 PM Attending Provider: Ladene Artist., MD    [name] is a 83 y.o. male patient admitted from ED awake, alert  & orientated  X 3,  Full Code, VSS - Blood pressure 128/65, pulse 79, temperature 97.8 F (36.6 C), resp. rate 17, SpO2 98 %., O2  2 L nasal cannular, no c/o shortness of breath, no c/o chest pain, no distress noted. Tele # 18 placed and pt is currently running: First degree HB with BBB and PVCs.   IV site WDL:  with a transparent dsg that's clean dry and intact.  Allergies:   Allergies  Allergen Reactions  . Metformin And Related Diarrhea and Nausea And Vomiting     Past Medical History:  Diagnosis Date  . Aortic stenosis, moderate cardiolgosit-  dr Cathie Olden   per last echo 10-15-2016 aortic stenosis somewhat worse since last echo 11-14-2015 moderate calcification and thickened leaflets, valve area 1.01cm^2,  mead gradiant 41mmHg, peak grandiant 89mmHgf  . Barrett esophagus dx 2015  . Chronic stable angina ([place])   . Chronic systolic CHF (congestive heart failure) ([place]) followed by dr Cathie Olden   a. EF previously 35-40%. 03/ 2013 b. improved to 55-65% by cath 08/2013./  per echo 06/ 2018 ef 50-55%  . CKD (chronic kidney disease), stage III   . Constipation   . Coronary artery disease cardiologist-- dr Cathie Olden   a. s/p PCI w/ DES to mLAD 08/05/10. b. NSTEMI 08/2013 (mildly elev trop) - cath showing widely patent stent, 80% prox D1 (small vessel) with recommendation for medical management, residual 60% ostial PDA, <20% LCx, LVEF 55-65%.   . Depression   . History of colon cancer, stage III oncologist- dr Waymon Budge--  per last note no recurrence 2013   dx 10/ 1992,  Stage III-- treament post right colecotmy, chemoradiation/  1993 localized recurrent mesenteric mass -- treatment post resection mass and chemoradiation  . History of kidney stones   . History of non-ST elevation myocardial infarction (NSTEMI)    08-14-2013  . History of small bowel obstruction    03/ 2005 partial SBO due to adhesions  post lysis adhesions and small bowel resection  . Hyperlipidemia   . Hypertension   . Macrocytic anemia dx 1992   chronic  . PAF (paroxysmal atrial fibrillation) [facility]) cardiologist-  dr Cathie Olden   a. failed TEE due to inability to pass probe into the esophagus in March 2013. b. On Coumadin. Was in NSR 08/2013 admission.  . Premature atrial contractions   . Premature ventricular contractions   . RBBB (right bundle branch block)   . Rectal mass   . S/P drug eluting coronary stent placement 08/05/2010   DES x1  to midLAD  . Type II diabetes mellitus ([place])   . Wears glasses     History:  obtained from patient  Pt orientation to unit, room and routine. Information packet given to patient.  Admission INP armband ID verified with patient, and in place. SR up x 2, fall risk assessment complete with Patient verbalizing understanding of risks associated with falls. Pt verbalizes an understanding of how to use the call bell and to call for help before getting out of bed.  Skin, clean-dry- intact without evidence of bruising, or skin tears.   No evidence of skin break down noted on exam.    Will cont to monitor and assist as needed.  Parthenia Ames, RN 01/15/2017 8:42 PM

## 2017-01-15 NOTE — ED Provider Notes (Signed)
Patient care assumed from Janetta Hora, PA-C at shift change. Please see her note for further.  Patient with chest pain and hypoxia. Plan for CTA chest to r/o PE at shift change.  CT angiogram of his chest shows no pulmonary embolism. It does show a right upper lobe pneumonia. This will explain the patient's hypoxia and chest pain. Will start Rocephin and azithromycin and admitted. On my evaluation patient is resting comfortably in the room on 2 L via nasal cannula. He is not hypoxic on 2 L. I discussed test results. Patient and family agrees with plan for admission.  I consulted with hospitalist Dr. Florene Glen who accepted the patient for admission.    Ct Angio Chest Pe W/cm &/or Wo Cm  Result Date: 01/15/2017 CLINICAL DATA:  Left-sided chest pain, atrial fibrillation. Elevated D-dimer. Newly diagnosed rectal cancer with liver lesions. EXAM: CT ANGIOGRAPHY CHEST WITH CONTRAST TECHNIQUE: Multidetector CT imaging of the chest was performed using the standard protocol during bolus administration of intravenous contrast. Multiplanar CT image reconstructions and MIPs were obtained to evaluate the vascular anatomy. CONTRAST:  100 cc Isovue 370 COMPARISON:  Chest CT dated 12/23/2016. FINDINGS: Cardiovascular: There is no pulmonary embolism identified within the main, lobar or segmental pulmonary arteries bilaterally. Aortic atherosclerosis. No aortic aneurysm. Heart size is upper normal. No pericardial effusion. Diffuse coronary artery calcifications. Mediastinum/Nodes: Scattered small and mildly prominent lymph nodes are again seen within the mediastinum and perihilar regions, unchanged in the short-term interval. No obvious pathologically enlarged lymph nodes identified. Esophagus appears normal. Trachea and central bronchi are unremarkable,. Lungs/Pleura: Advanced changes of chronic interstitial lung disease are emphysema, not significantly changed compared to the recent chest CT. There is increased consolidation  within the right upper lobe, at the posterior margin of previously described emphysematous change and cylindrical bronchiectasis, suspicious for developing pneumonia. 8 mm pulmonary nodule noted at the left lung apex, stable in the short-term interval new compared to earlier chest CT of 08/09/2010. Small bilateral pleural effusions. Upper Abdomen: Limited images of the upper abdomen are unremarkable, characterization limited by the early contrast phase, no appreciable contrast as yet in the upper abdomen at the time of imaging. The liver lesions described on earlier CT are not at able to be visualized. Musculoskeletal: No acute or suspicious osseous finding. Review of the MIP images confirms the above findings. IMPRESSION: 1. No pulmonary embolism identified. 2. Increased consolidation (compared to recent chest CT of 12/23/2016) within the right upper lobe at the posterior margin of previously described advanced emphysematous change and cylindrical bronchiectasis. This increased consolidation is highly suspicious for pneumonia. Atelectasis is considered less likely. 3. Advanced emphysematous changes and chronic interstitial lung disease, upper lobe predominant, with associated cylindrical bronchiectasis. 4. 8 mm pulmonary nodule within the left upper lobe (series 6, image 37). This most likely represents nodular atelectasis or fibrosis, however, neoplastic nodule is certainly not excluded in the setting of emphysema and known rectal cancer. PET-CT may be helpful for further characterization. At minimum, recommend follow-up chest CT in 2-3 months to ensure stability or resolution. 5. Small bilateral pleural effusions. 6. Aortic atherosclerosis. 7. Diffuse coronary artery calcifications. Recommend correlation with any possible cardiac symptoms. Aortic Atherosclerosis (ICD10-I70.0) and Emphysema (ICD10-J43.9). Electronically Signed   By: Franki Cabot M.D.   On: 01/15/2017 16:27   Community acquired pneumonia of  right upper lobe of lung ([place])  Hypoxia  Precordial pain       Waynetta Pean, PA-C 01/15/17 1731    Virgel Manifold, MD 01/17/17  1200  

## 2017-01-15 NOTE — H&P (Signed)
History and Physical    WARD BOISSONNEAULT EQA:[id number] DOB: 1923-10-22 DOA: 01/15/2017  PCP: Lavone Orn, MD  Patient coming from: home  I have personally briefly reviewed patient's old medical records in [place]  Chief Complaint: chest discomfort  HPI: [name] is Carolyn Maniscalco 83 y.o. male with medical history significant of CAD s/p stent 11/2010, HTN, HLD, t2DM, atrial fibrillation (not on anticoagulation), recurrent colon cancer stage IV rectal adenocarcinoma with suspected liver mets who presents with about 2 days of L and R sided chest pain.  The pain is located in the R and L axilla.  He describes as "just steady pain".  Pain is pleuritic.  Nitro did not relieve his symptoms. Symptoms started on 9/6, mostly on the L side at first, but now both sides.  It's relatively constant, he notes when he breaths.  He endorses SOB, cough productive of phlegm (no blood).  No fevers, nausea, vomiting, abdominal pain, rash or dysuria.  No sick contacts or recent travel.  He lives alone and feels safe, but has felt Kaycee Mcgaugh bit wobbly recently since starting radiation.  Of note he was recently admitted for an episode of syncope and collapse that was thought 2/2 medication vs anemia,?VT or NSVT.  Medications were adjusted by cardiology.  He had an echo notable for an EF of 40-45% with moderate AOS and an outpatient event monitor was recommended.     ED Course: CXR, EKG, basic labs, ddimer elevated and CTA obtained which was notable for increased consolidation in RUL.    Review of Systems: As per HPI otherwise 10 point review of systems negative.   Past Medical History:  Diagnosis Date  . Aortic stenosis, moderate cardiolgosit-  dr Cathie Olden   per last echo 10-15-2016 aortic stenosis somewhat worse since last echo 11-14-2015 moderate calcification and thickened leaflets, valve area 1.01cm^2,  mead gradiant 27mmHg, peak grandiant 55mmHgf  . Barrett esophagus dx 2015  . Chronic stable angina ([place])   .  Chronic systolic CHF (congestive heart failure) ([place]) followed by dr Cathie Olden   Tennyson Wacha. EF previously 35-40%. 03/ 2013 b. improved to 55-65% by cath 08/2013./  per echo 06/ 2018 ef 50-55%  . CKD (chronic kidney disease), stage III   . Constipation   . Coronary artery disease cardiologist-- dr Cathie Olden   Corah Willeford. s/p PCI w/ DES to mLAD 08/05/10. b. NSTEMI 08/2013 (mildly elev trop) - cath showing widely patent stent, 80% prox D1 (small vessel) with recommendation for medical management, residual 60% ostial PDA, <20% LCx, LVEF 55-65%.   . Depression   . History of colon cancer, stage III oncologist- dr Waymon Budge--  per last note no recurrence 2013   dx 10/ 1992,  Stage III-- treament post right colecotmy, chemoradiation/  1993 localized recurrent mesenteric mass -- treatment post resection mass and chemoradiation  . History of kidney stones   . History of non-ST elevation myocardial infarction (NSTEMI)    08-14-2013  . History of small bowel obstruction    03/ 2005 partial SBO due to adhesions  post lysis adhesions and small bowel resection  . Hyperlipidemia   . Hypertension   . Macrocytic anemia dx 1992   chronic  . PAF (paroxysmal atrial fibrillation) [facility]) cardiologist-  dr Cathie Olden   Natan Hartog. failed TEE due to inability to pass probe into the esophagus in March 2013. b. On Coumadin. Was in NSR 08/2013 admission.  . Premature atrial contractions   . Premature ventricular contractions   . RBBB (right bundle  branch block)   . Rectal mass   . S/P drug eluting coronary stent placement 08/05/2010   DES x1  to midLAD  . Type II diabetes mellitus ([place])   . Wears glasses     Past Surgical History:  Procedure Laterality Date  . ABDOMINAL MASS RESECTION  11/1991   localized recurrent colon cancer mesenteric lymph node mass (area of distal aorta and vena cava) and debulking  . CARDIAC CATHETERIZATION  08/03/10   dr Lia Foyer   preserved LVSF, mod. high grade stenosis LAD, diagonal stenosis at the bend 60-70%  .  CARDIAC CATHETERIZATION  08-09-2016  dr Martinique   ostialLAD 30%, wide patent mLAD stent, pCFX 40%,  mild irregularities throughout RCA < 20%  . CARDIAC CATHETERIZATION N/Shelbee Apgar 05/20/2016   Procedure: Left Heart Cath and Coronary Angiography;  Surgeon: Lorretta Harp, MD;  Location: [facility];  Service: Cardiovascular;  Laterality: N/Alfard Cochrane; normal LVF,  D2 80%.  ostRCA 50%,  LAD stent patent  . CARDIOVASCULAR STRESS TEST  09-01-2016   dr Cathie Olden   Intermediate risk nuclear study w/ medium non-reversible defect of moderate severity in the basal inferolateral and mid inferolateral location/ no ST segment deviation noted during stress,  nuclear stress ef 48% (45-54%)  . CATARACT EXTRACTION W/ INTRAOCULAR LENS  IMPLANT, BILATERAL    . CORONARY ANGIOPLASTY WITH STENT PLACEMENT  08/05/10   dr Lia Foyer   PCI and DES x1  LAD  . CYSTO/  URETEROSCOPIC STONE EXTRACTION Left 10/18/2002  . CYSTOSCOPY WITH RETROGRADE PYELOGRAM, URETEROSCOPY AND STENT PLACEMENT Bilateral 05/18/2013   Procedure: CYSTOSCOPY WITH BILATERAL RETROGRADE PYELOGRAM, LEFT URETEROSCOPY AND LEFT STENT PLACEMENT;  Surgeon: Alexis Frock, MD;  Location: [facility];  Service: Urology;  Laterality: Bilateral;  . EXPLORATORY LAPAROTOMY W/ LYSIS ADHESIONS AND SMALL BOWEL RESECTION  07/09/2003   partial SBO  . FLEXIBLE SIGMOIDOSCOPY N/Nyelle Wolfson 10/07/2016   Procedure: FLEXIBLE SIGMOIDOSCOPY;  Surgeon: Wilford Corner, MD;  Location[facility] ENDOSCOPY;  Service: Endoscopy;  Laterality: N/Montasia Chisenhall;  . LAPAROSCOPIC CHOLECYSTECTOMY  12-04-2009  dr Margot Chimes  . LEFT HEART CATHETERIZATION WITH CORONARY ANGIOGRAM N/Jatoria Kneeland 08/15/2013   Procedure: LEFT HEART CATHETERIZATION WITH CORONARY ANGIOGRAM;  Surgeon: Peter M Martinique, MD;  Location: [facility] CATH LAB;  Service: Cardiovascular;  Laterality: N/Charlane Westry; single vessel obstructive CAD involving Ben Habermann small diagonal branch, patent LAD stent , normal LVF  . RIGHT COLECTOMY  03/1991   w/ appendectomy  . TONSILLECTOMY    . TRANSTHORACIC ECHOCARDIOGRAM   10-15-2016  dr Cathie Olden   grade 1 diastolic dysfunction, ef 42-59%/  moderate AV stenosis (valve area 1.012cm^2, mean gradiant 60mmHg, peak grandiant 64mmHg)/ moderate LAE/  trivial PR/  mild RAE     reports that he quit smoking about 24 years ago. His smoking use included Cigarettes. He has Jordyan Hardiman 39.00 pack-year smoking history. He has never used smokeless tobacco. He reports that he drinks about 1.2 oz of alcohol per week . He reports that he does not use drugs.  Allergies  Allergen Reactions  . Metformin And Related Diarrhea and Nausea And Vomiting    Family History  Problem Relation Age of Onset  . Cancer Mother        breast cancer   . Ulcers Father 76  . Heart attack Brother 55    Prior to Admission medications   Medication Sig Start Date End Date Taking? Authorizing Provider  acetaminophen (TYLENOL) 500 MG tablet Take 1,000 mg by mouth every 6 (six) hours as needed (shoulder pain).   Yes [provider]  amLODipine (NORVASC) 2.5 MG tablet Take 1 tablet (2.5 mg total) by mouth daily. 01/05/17  Yes Nahser, Wonda Cheng, MD  aspirin EC 81 MG tablet Take 1 tablet (81 mg total) by mouth daily. 10/09/16  Yes Bonnielee Haff, MD  atorvastatin (LIPITOR) 40 MG tablet Take 40 mg by mouth daily.   Yes [provider]  b complex vitamins tablet Take 1 tablet by mouth daily.   Yes [provider]  carvedilol (COREG) 3.125 MG tablet Take 1 tablet (3.125 mg total) by mouth 2 (two) times daily with Lavaun Greenfield meal. 01/04/17  Yes Samtani, Jai-Gurmukh, MD  donepezil (ARICEPT) 10 MG tablet Take 10 mg by mouth at bedtime.  05/04/16  Yes [provider]  finasteride (PROSCAR) 5 MG tablet Take 5 mg by mouth at bedtime.   Yes [provider]  glimepiride (AMARYL) 1 MG tablet Take 1 mg by mouth daily with breakfast.  05/29/16  Yes [provider]  isosorbide mononitrate (IMDUR) 30 MG 24 hr tablet Take 1 tablet (30 mg total) by mouth daily. 01/05/17  Yes Nita Sells,  MD  linagliptin (TRADJENTA) 5 MG TABS tablet Take 1 tablet (5 mg total) by mouth daily. For blood sugar Patient taking differently: Take 5 mg by mouth daily. For blood sugar 11/04/15  Yes Johnson, Clanford L, MD  nitroGLYCERIN (NITROSTAT) 0.4 MG SL tablet PLACE ONE TABLET UNDER THE TONGUE EVERY 5 MINUTES AS NEEDED FOR CHEST PAIN. Patient taking differently: PLACE 0.4 MG UNDER THE TONGUE EVERY 5 MINUTES AS NEEDED FOR CHEST PAIN. 08/16/16  Yes Nahser, Wonda Cheng, MD  pantoprazole (PROTONIX) 40 MG tablet Take 40 mg by mouth daily.   Yes [provider]  Propylene Glycol (SYSTANE BALANCE) 0.6 % SOLN Place 1-2 drops into both eyes 3 (three) times daily as needed (for dry eyes).    Yes [provider]  ranolazine (RANEXA) 1000 MG SR tablet Take 1 tablet (1,000 mg total) by mouth 2 (two) times daily. 01/04/17  Yes Nita Sells, MD  oxyCODONE (OXY IR/ROXICODONE) 5 MG immediate release tablet Take 1 tablet (5 mg total) by mouth every 6 (six) hours as needed. Patient not taking: Reported on 01/15/2017 01/14/34   Leighton Ruff, MD    Physical Exam: Vitals:   01/15/17 1600 01/15/17 1700 01/15/17 1800 01/15/17 1950  BP: (!) 161/97 (!) 145/79 (!) 132/57 128/65  Pulse: 80 74 75 79  Resp: (!) 22 19 20 17   Temp:    97.8 F (36.6 C)  TempSrc:      SpO2: 98% 96% 100% 98%    Constitutional: NAD, calm, comfortable Vitals:   01/15/17 1600 01/15/17 1700 01/15/17 1800 01/15/17 1950  BP: (!) 161/97 (!) 145/79 (!) 132/57 128/65  Pulse: 80 74 75 79  Resp: (!) 22 19 20 17   Temp:    97.8 F (36.6 C)  TempSrc:      SpO2: 98% 96% 100% 98%   Eyes: PERRL, lids and conjunctivae normal ENMT: Mucous membranes are moist. Posterior pharynx clear of any exudate or lesions.Normal dentition.  Neck: normal, supple, no masses, no thyromegaly Respiratory: Some scattered crackles, most notable on R side, good air movement, unlabored breathing. On 2 L by [place].  Cardiovascular: Irregular rate and rhythm, no  murmurs / rubs / gallops. No extremity edema. 2+ pedal pulses.  Abdomen: no tenderness, no masses palpated. No hepatosplenomegaly. Bowel sounds positive.  Musculoskeletal: no clubbing / cyanosis. No joint deformity upper and lower extremities. Good ROM, no contractures. Normal  muscle tone.  Skin: no rashes, lesions, ulcers. No induration Neurologic: CN 2-12 grossly intact. Sensation intact.  Moving all extremities normally. Psychiatric: Normal judgment and insight. Alert and oriented x 3. Normal mood.   Labs on Admission: I have personally reviewed following labs and imaging studies  CBC:  Recent Labs Lab 01/11/17 1137 01/15/17 1239  WBC 6.3 5.5  NEUTROABS 5.2  --   HGB 10.5* 10.7*  HCT 31.8* 31.1*  MCV 107.1* 103.3*  PLT 166 623   Basic Metabolic Panel:  Recent Labs Lab 01/11/17 1138 01/15/17 1239  NA 136 134*  K 4.1 4.1  CL  --  103  CO2 27 23  GLUCOSE 256* 151*  BUN 20.5 17  CREATININE 1.1 1.02  CALCIUM 9.0 8.6*   GFR: Estimated Creatinine Clearance: 41.8 mL/min (by C-G formula based on SCr of 1.02 mg/dL). Liver Function Tests:  Recent Labs Lab 01/11/17 1138  AST 24  ALT 23  ALKPHOS 122  BILITOT 0.57  PROT 6.7  ALBUMIN 2.8*   No results for input(s): LIPASE, AMYLASE in the last 168 hours. No results for input(s): AMMONIA in the last 168 hours. Coagulation Profile: No results for input(s): INR, PROTIME in the last 168 hours. Cardiac Enzymes: No results for input(s): CKTOTAL, CKMB, CKMBINDEX, TROPONINI in the last 168 hours. BNP (last 3 results) No results for input(s): PROBNP in the last 8760 hours. HbA1C: No results for input(s): HGBA1C in the last 72 hours. CBG: No results for input(s): GLUCAP in the last 168 hours. Lipid Profile: No results for input(s): CHOL, HDL, LDLCALC, TRIG, CHOLHDL, LDLDIRECT in the last 72 hours. Thyroid Function Tests: No results for input(s): TSH, T4TOTAL, FREET4, T3FREE, THYROIDAB in the last 72 hours. Anemia  Panel: No results for input(s): VITAMINB12, FOLATE, FERRITIN, TIBC, IRON, RETICCTPCT in the last 72 hours. Urine analysis:    Component Value Date/Time   COLORURINE YELLOW 11/19/2016 0055   APPEARANCEUR CLEAR 11/19/2016 0055   LABSPEC 1.012 11/19/2016 0055   PHURINE 6.0 11/19/2016 0055   GLUCOSEU NEGATIVE 11/19/2016 0055   HGBUR NEGATIVE 11/19/2016 0055   BILIRUBINUR NEGATIVE 11/19/2016 0055   KETONESUR NEGATIVE 11/19/2016 0055   PROTEINUR NEGATIVE 11/19/2016 0055   UROBILINOGEN 0.2 06/07/2014 1924   NITRITE NEGATIVE 11/19/2016 0055   LEUKOCYTESUR NEGATIVE 11/19/2016 0055    Radiological Exams on Admission: Ct Angio Chest Pe W/cm &/or Wo Cm  Result Date: 01/15/2017 CLINICAL DATA:  Left-sided chest pain, atrial fibrillation. Elevated D-dimer. Newly diagnosed rectal cancer with liver lesions. EXAM: CT ANGIOGRAPHY CHEST WITH CONTRAST TECHNIQUE: Multidetector CT imaging of the chest was performed using the standard protocol during bolus administration of intravenous contrast. Multiplanar CT image reconstructions and MIPs were obtained to evaluate the vascular anatomy. CONTRAST:  100 cc Isovue 370 COMPARISON:  Chest CT dated 12/23/2016. FINDINGS: Cardiovascular: There is no pulmonary embolism identified within the main, lobar or segmental pulmonary arteries bilaterally. Aortic atherosclerosis. No aortic aneurysm. Heart size is upper normal. No pericardial effusion. Diffuse coronary artery calcifications. Mediastinum/Nodes: Scattered small and mildly prominent lymph nodes are again seen within the mediastinum and perihilar regions, unchanged in the short-term interval. No obvious pathologically enlarged lymph nodes identified. Esophagus appears normal. Trachea and central bronchi are unremarkable,. Lungs/Pleura: Advanced changes of chronic interstitial lung disease are emphysema, not significantly changed compared to the recent chest CT. There is increased consolidation within the right upper lobe, at  the posterior margin of previously described emphysematous change and cylindrical bronchiectasis, suspicious for developing pneumonia. 8 mm pulmonary  nodule noted at the left lung apex, stable in the short-term interval new compared to earlier chest CT of 08/09/2010. Small bilateral pleural effusions. Upper Abdomen: Limited images of the upper abdomen are unremarkable, characterization limited by the early contrast phase, no appreciable contrast as yet in the upper abdomen at the time of imaging. The liver lesions described on earlier CT are not at able to be visualized. Musculoskeletal: No acute or suspicious osseous finding. Review of the MIP images confirms the above findings. IMPRESSION: 1. No pulmonary embolism identified. 2. Increased consolidation (compared to recent chest CT of 12/23/2016) within the right upper lobe at the posterior margin of previously described advanced emphysematous change and cylindrical bronchiectasis. This increased consolidation is highly suspicious for pneumonia. Atelectasis is considered less likely. 3. Advanced emphysematous changes and chronic interstitial lung disease, upper lobe predominant, with associated cylindrical bronchiectasis. 4. 8 mm pulmonary nodule within the left upper lobe (series 6, image 37). This most likely represents nodular atelectasis or fibrosis, however, neoplastic nodule is certainly not excluded in the setting of emphysema and known rectal cancer. PET-CT may be helpful for further characterization. At minimum, recommend follow-up chest CT in 2-3 months to ensure stability or resolution. 5. Small bilateral pleural effusions. 6. Aortic atherosclerosis. 7. Diffuse coronary artery calcifications. Recommend correlation with any possible cardiac symptoms. Aortic Atherosclerosis (ICD10-I70.0) and Emphysema (ICD10-J43.9). Electronically Signed   By: Franki Cabot M.D.   On: 01/15/2017 16:27   Dg Chest Port 1 View  Result Date: 01/15/2017 CLINICAL DATA:   Left-sided chest pain. EXAM: PORTABLE CHEST 1 VIEW COMPARISON:  01/01/2017 FINDINGS: The cardiac silhouette is borderline enlarged, unchanged. Aortic atherosclerosis is noted. Advanced chronic lung disease is again seen with interstitial coarsening diffusely as well as bullous changes and asymmetric density in the right upper lobe, overall not significantly changed. No definite superimposed acute airspace consolidation, edema, sizable pleural effusion, or pneumothorax is identified. No acute osseous abnormality is seen. IMPRESSION: Chronic lung disease without definite acute abnormality. Electronically Signed   By: Logan Bores M.D.   On: 01/15/2017 13:26    EKG: Independently reviewed. Appears similar to priors.  Sinus rhythm with PVC's and RBBB.  QT 475.  Assessment/Plan Principal Problem:   CAP (community acquired pneumonia) Active Problems:   Paroxysmal atrial fibrillation (HCC)   History of non-ST elevation myocardial infarction (NSTEMI)   Heart failure with reduced ejection fraction (HCC)   Aortic stenosis   Rectal cancer ([place])  Community Acquired Pneumonia  Chronic Interstitial Lung Disease:  Technically HCAP with recent hospitalization, but without recent broad spectrum abx use.  No fevers, tachycardia, and normal WBC count here.  Has advanced emphysematous change and cylindrical bronchiectasis on his CT chest as well as RUL consolidation concerning for pneumonia.  Will continue ceftriaxone/azithro coverage for CAP organisms for now. QT slightly prolonged, ctm.  CURB 65 1, but requiring 2 L by [place]. - if not improving, consider switching to levaquin  [ ]  sputum cx, blood cx, urine strep antigen - admit to telemetry unit  Atypical Chest Pain  Hx CAD s/p PCI to LAD in 2012, NSTEMI 2015:  Pleuritic CP with evidence of pneumonia on imaging.  Pain on both R and L side in axilla, slightly more on L (of note, his pneumonia is on the R).  Low suspicion of cardiac CP, also has been going on for 48  hrs with negative initial troponin makes cardiac etiology much less likely.  No further trops unless recurrent.  Ctm.  S/p ASA  in ED.  CT PE negative.  Recent cath 05/2016 with patent LAD stent and unchanged anatomy compared to 3 years ago. - home ranexa, holding nitro - ASA daily, atorva, imdur  Atrial fibrillation with frequent PVC's, bigeminy:  Sinus rhythm today.  Seen by cardiology last hospitalization in setting of syncope and collapse.  Recommended event monitor.      Rectal Cancer, Adneocarcinoma, Stage IV:  Dr. Burr Medico recommending palliative radiation (started 8/29).    Anemia, macrocytic:  [place]line H/H around 11.  Stable today.  CTM  HTN: Continue home BP meds  T2DM: holding home oral meds.  SSI.  HFrEF/pEF: EF 88-50%, diastolic dysfunction.  Also aortic stenosis, moderate to severe.   Carvedilol  Careful with IVF  GERD: home PPI  Dementia: aricept  8 mm pulm nodule in LUL:  Likely nodular atelectasis vs fibrosis, but neoplastic nodule not excluded.  Per rads, consider PET CT for further characterization, "at minimum, repeat chest CT in 2-3 months"  Goals of Care:  See discussion of code status below.  If unable to clarify, would consider this either inpatient or outpatient, may be able to give input regarding many comorbidities as well.    DVT prophylaxis: lovenox  Code Status:  On initial discussion he noted full code, but at last admission he was DNR.  Dr. Burr Medico recommended DNR at last office visit on 9/4 and she recommended DNR/DNI (notes that he would think about it).  I returned and asked the patient about code status and discussed with him the previous admission and the conversation with Dr. Burr Medico and he noted he still would want to think about it.  For now, full code, but if time would revisit conversation prior to discharge.   Family Communication: sister and patient  Disposition Plan: likely home Consults called: none  Admission status: obs    Tracia Lacomb Melven Sartorius  MD [facility] [phone]   If 7PM-7AM, please contact night-coverage [email or web address] Password [email or web address]  01/15/2017, 9:35 PM

## 2017-01-15 NOTE — ED Provider Notes (Signed)
[facility] DEPT Provider Note   CSN: [id number] Arrival date & time: 01/15/17  1206     History   Chief Complaint Chief Complaint  Patient presents with  . Chest Pain    HPI [name] is a 83 y.o. male who presents with chest pain. PMH significant for CAD, CHF EF 35-40%, moderate AS, PAF not anticoagulated, hx of metastatic colon cancer currently undergoing radiation, CKD stage 3, Type 2 DM, HTN, HLD. Sister is at bedside. He states that chest pain started acutely at 10AM today. It is over the left side of his chest and radiates to the back. He has not had chest pain like this before. He has a productive cough but is unsure for how long. EMS was called and he was given ASA. He states the chest pain is getting better. He denies fever or chills, lightheadedness, syncope, SOB, abdominal pain, N/V. Last cath was in Jan 2018 which showed CAD had not worsened since prior cath in 2015. Cardiologist is Dr. [name].   HPI  Past Medical History:  Diagnosis Date  . Aortic stenosis, moderate cardiolgosit-  dr Cathie Olden   per last echo 10-15-2016 aortic stenosis somewhat worse since last echo 11-14-2015 moderate calcification and thickened leaflets, valve area 1.01cm^2,  mead gradiant 17mmHg, peak grandiant 12mmHgf  . Barrett esophagus dx 2015  . Chronic stable angina ([place])   . Chronic systolic CHF (congestive heart failure) ([place]) followed by dr Cathie Olden   a. EF previously 35-40%. 03/ 2013 b. improved to 55-65% by cath 08/2013./  per echo 06/ 2018 ef 50-55%  . CKD (chronic kidney disease), stage III   . Constipation   . Coronary artery disease cardiologist-- dr Cathie Olden   a. s/p PCI w/ DES to mLAD 08/05/10. b. NSTEMI 08/2013 (mildly elev trop) - cath showing widely patent stent, 80% prox D1 (small vessel) with recommendation for medical management, residual 60% ostial PDA, <20% LCx, LVEF 55-65%.   . Depression   . History of colon cancer, stage III oncologist- dr Waymon Budge--  per last note no  recurrence 2013   dx 10/ 1992,  Stage III-- treament post right colecotmy, chemoradiation/  1993 localized recurrent mesenteric mass -- treatment post resection mass and chemoradiation  . History of kidney stones   . History of non-ST elevation myocardial infarction (NSTEMI)    08-14-2013  . History of small bowel obstruction    03/ 2005 partial SBO due to adhesions  post lysis adhesions and small bowel resection  . Hyperlipidemia   . Hypertension   . Macrocytic anemia dx 1992   chronic  . PAF (paroxysmal atrial fibrillation) [facility]) cardiologist-  dr Cathie Olden   a. failed TEE due to inability to pass probe into the esophagus in March 2013. b. On Coumadin. Was in NSR 08/2013 admission.  . Premature atrial contractions   . Premature ventricular contractions   . RBBB (right bundle branch block)   . Rectal mass   . S/P drug eluting coronary stent placement 08/05/2010   DES x1  to midLAD  . Type II diabetes mellitus ([place])   . Wears glasses     Patient Active Problem List   Diagnosis Date Noted  . Protein-calorie malnutrition, severe 01/04/2017  . Syncope and collapse 01/01/2017  . Bradycardia 01/01/2017  . Iron deficiency anemia due to chronic blood loss 12/28/2016  . Rectal cancer ([place]) 12/27/2016  . Aortic stenosis 10/15/2016  . Barrett esophagus 07/17/2016  . Encounter for therapeutic drug monitoring 05/24/2016  . Malnutrition  of moderate degree 05/20/2016  . Hypertensive heart disease without heart failure   . Chronic diastolic heart failure ([place])   . Transient Hypotension 11/03/2015  . Generalized weakness 11/03/2015  . Elevated INR 11/03/2015  . Anemia, unspecified 11/03/2015  . Chronic anticoagulation 11/03/2015  . Thrombocytopenia ([place]) 08/10/2015  . Non-insulin dependent type 2 diabetes mellitus ([place])   . History of colon cancer, stage III 09/11/2014  . BRBPR (bright red blood per rectum) 09/11/2014  . Other pancytopenia ([place]) 06/18/2014  . History of non-ST elevation  myocardial infarction (NSTEMI) 08/15/2013  . 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 Paroxysmal atrial fibrillation ([place]) 07/20/2011  . Cardiomyopathy ([place]) 07/20/2011  . Macrocytic anemia 06/25/2011  . Bruising 09/18/2010  . Coronary artery disease involving native heart without angina pectoris 08/13/2010  . Hypertension   . Hyperlipidemia     Past Surgical History:  Procedure Laterality Date  . ABDOMINAL MASS RESECTION  11/1991   localized recurrent colon cancer mesenteric lymph node mass (area of distal aorta and vena cava) and debulking  . CARDIAC CATHETERIZATION  08/03/10   dr Lia Foyer   preserved LVSF, mod. high grade stenosis LAD, diagonal stenosis at the bend 60-70%  . CARDIAC CATHETERIZATION  08-09-2016  dr Martinique   ostialLAD 30%, wide patent mLAD stent, pCFX 40%,  mild irregularities throughout RCA < 20%  . CARDIAC CATHETERIZATION N/A 05/20/2016   Procedure: Left Heart Cath and Coronary Angiography;  Surgeon: Lorretta Harp, MD;  Location: [facility];  Service: Cardiovascular;  Laterality: N/A; normal LVF,  D2 80%.  ostRCA 50%,  LAD stent patent  . CARDIOVASCULAR STRESS TEST  09-01-2016   dr Cathie Olden   Intermediate risk nuclear study w/ medium non-reversible defect of moderate severity in the basal inferolateral and mid inferolateral location/ no ST segment deviation noted during stress,  nuclear stress ef 48% (45-54%)  . CATARACT EXTRACTION W/ INTRAOCULAR LENS  IMPLANT, BILATERAL    . CORONARY ANGIOPLASTY WITH STENT PLACEMENT  08/05/10   dr Lia Foyer   PCI and DES x1  LAD  . CYSTO/  URETEROSCOPIC STONE EXTRACTION Left 10/18/2002  . CYSTOSCOPY WITH RETROGRADE PYELOGRAM, URETEROSCOPY AND STENT PLACEMENT Bilateral 05/18/2013   Procedure: CYSTOSCOPY WITH BILATERAL RETROGRADE PYELOGRAM, LEFT URETEROSCOPY AND LEFT STENT PLACEMENT;  Surgeon: Alexis Frock, MD;  Location: [facility];  Service: Urology;  Laterality: Bilateral;  . EXPLORATORY LAPAROTOMY W/  LYSIS ADHESIONS AND SMALL BOWEL RESECTION  07/09/2003   partial SBO  . FLEXIBLE SIGMOIDOSCOPY N/A 10/07/2016   Procedure: FLEXIBLE SIGMOIDOSCOPY;  Surgeon: Wilford Corner, MD;  Location: [facility] ENDOSCOPY;  Service: Endoscopy;  Laterality: N/A;  . LAPAROSCOPIC CHOLECYSTECTOMY  12-04-2009  dr Margot Chimes  . LEFT HEART CATHETERIZATION WITH CORONARY ANGIOGRAM N/A 08/15/2013   Procedure: LEFT HEART CATHETERIZATION WITH CORONARY ANGIOGRAM;  Surgeon: Peter M Martinique, MD;  Location: [facility] CATH LAB;  Service: Cardiovascular;  Laterality: N/A; single vessel obstructive CAD involving a small diagonal branch, patent LAD stent , normal LVF  . RIGHT COLECTOMY  03/1991   w/ appendectomy  . TONSILLECTOMY    . TRANSTHORACIC ECHOCARDIOGRAM  10-15-2016  dr Cathie Olden   grade 1 diastolic dysfunction, ef 62-70%/  moderate AV stenosis (valve area 1.012cm^2, mean gradiant 12mmHg, peak grandiant 85mmHg)/ moderate LAE/  trivial PR/  mild RAE       Home Medications    Prior to Admission medications   Medication Sig Start Date End Date Taking? Authorizing Provider  acetaminophen (TYLENOL) 500 MG tablet Take 1,000 mg  by mouth every 6 (six) hours as needed (shoulder pain).    [provider]  amLODipine (NORVASC) 2.5 MG tablet Take 1 tablet (2.5 mg total) by mouth daily. 01/05/17   Nahser, Wonda Cheng, MD  aspirin EC 81 MG tablet Take 1 tablet (81 mg total) by mouth daily. 10/09/16   Bonnielee Haff, MD  carvedilol (COREG) 3.125 MG tablet Take 1 tablet (3.125 mg total) by mouth 2 (two) times daily with a meal. 01/04/17   Nita Sells, MD  donepezil (ARICEPT) 10 MG tablet Take 10 mg by mouth at bedtime.  05/04/16   [provider]  glimepiride (AMARYL) 1 MG tablet Take 1 mg by mouth daily with breakfast.  05/29/16   [provider]  isosorbide mononitrate (IMDUR) 30 MG 24 hr tablet Take 1 tablet (30 mg total) by mouth daily. 01/05/17   Nita Sells, MD  linagliptin (TRADJENTA) 5 MG TABS tablet Take 1  tablet (5 mg total) by mouth daily. For blood sugar Patient taking differently: Take 5 mg by mouth daily. For blood sugar 11/04/15   Johnson, Clanford L, MD  nitroGLYCERIN (NITROSTAT) 0.4 MG SL tablet PLACE ONE TABLET UNDER THE TONGUE EVERY 5 MINUTES AS NEEDED FOR CHEST PAIN. 08/16/16   Nahser, Wonda Cheng, MD  oxyCODONE (OXY IR/ROXICODONE) 5 MG immediate release tablet Take 1 tablet (5 mg total) by mouth every 6 (six) hours as needed. 0/2/54   Leighton Ruff, MD  Propylene Glycol (SYSTANE BALANCE) 0.6 % SOLN Place 1-2 drops into both eyes 3 (three) times daily as needed (for dry eyes).     [provider]  ranolazine (RANEXA) 1000 MG SR tablet Take 1 tablet (1,000 mg total) by mouth 2 (two) times daily. Patient not taking: Reported on 01/11/2017 01/04/17   Nita Sells, MD    Family History Family History  Problem Relation Age of Onset  . Cancer Mother        breast cancer   . Ulcers Father 36  . Heart attack Brother 80    Social History Social History  Substance Use Topics  . Smoking status: Former Smoker    Packs/day: 1.00    Years: 39.00    Types: Cigarettes    Quit date: 05/10/1992  . Smokeless tobacco: Never Used  . Alcohol use 1.2 oz/week    2 Shots of liquor per week     Comment: occasionally.     Allergies   Metformin and related   Review of Systems Review of Systems  Constitutional: Negative for chills, diaphoresis and fever.  Respiratory: Positive for cough. Negative for shortness of breath and wheezing.   Cardiovascular: Positive for chest pain. Negative for palpitations and leg swelling.  Gastrointestinal: Negative for abdominal pain, nausea and vomiting.  Neurological: Negative for syncope and light-headedness.  All other systems reviewed and are negative.    Physical Exam Updated Vital Signs BP (!) 150/83 (BP Location: Right Arm)   Pulse 82   Temp 97.9 F (36.6 C) (Oral)   Resp 14   SpO2 99%   Physical Exam  Constitutional: He is oriented  to person, place, and time. He appears well-developed and well-nourished. No distress.  Elderly male in NAD.   HENT:  Head: Normocephalic and atraumatic.  Eyes: Conjunctivae and EOM are normal. Right eye exhibits no discharge. Left eye exhibits no discharge. No scleral icterus.  Neck: Normal range of motion. Neck supple.  Cardiovascular: Normal rate.  An irregularly irregular rhythm present.  Murmur heard. Pulmonary/Chest: Effort normal  and breath sounds normal. No stridor. Tachypnea noted. No respiratory distress. He has no decreased breath sounds. He has no wheezes. He has no rhonchi. He has no rales. He exhibits no tenderness.  Bibasilar crackles  Abdominal: Soft. Bowel sounds are normal. He exhibits no distension and no mass. There is no tenderness. There is no rebound and no guarding. No hernia.  Lymphadenopathy:    He has no cervical adenopathy.  Neurological: He is alert and oriented to person, place, and time.  Skin: Skin is warm and dry. No rash noted.  Psychiatric: He has a normal mood and affect. His behavior is normal.  Nursing note and vitals reviewed.    ED Treatments / Results  Labs (all labs ordered are listed, but only abnormal results are displayed) Labs Reviewed  BASIC METABOLIC PANEL - Abnormal; Notable for the following:       Result Value   Sodium 134 (*)    Glucose, Bld 151 (*)    Calcium 8.6 (*)    All other components within normal limits  CBC - Abnormal; Notable for the following:    RBC 3.01 (*)    Hemoglobin 10.7 (*)    HCT 31.1 (*)    MCV 103.3 (*)    MCH 35.5 (*)    All other components within normal limits  D-DIMER, QUANTITATIVE (NOT AT [facility]) - Abnormal; Notable for the following:    D-Dimer, Quant 3.55 (*)    All other components within normal limits  I-STAT TROPONIN, ED    EKG  EKG Interpretation  Date/Time:  Saturday January 15 2017 12:12:40 EDT Ventricular Rate:  88 PR Interval:    QRS Duration: 158 QT Interval:  464 QTC  Calculation: 475 R Axis:   109 Text Interpretation:  Sinus rhythm Premature ventricular complexes Right bundle branch block Confirmed by Virgel Manifold [phone]) on 01/15/2017 1:41:32 PM       Radiology Dg Chest Port 1 View  Result Date: 01/15/2017 CLINICAL DATA:  Left-sided chest pain. EXAM: PORTABLE CHEST 1 VIEW COMPARISON:  01/01/2017 FINDINGS: The cardiac silhouette is borderline enlarged, unchanged. Aortic atherosclerosis is noted. Advanced chronic lung disease is again seen with interstitial coarsening diffusely as well as bullous changes and asymmetric density in the right upper lobe, overall not significantly changed. No definite superimposed acute airspace consolidation, edema, sizable pleural effusion, or pneumothorax is identified. No acute osseous abnormality is seen. IMPRESSION: Chronic lung disease without definite acute abnormality. Electronically Signed   By: Logan Bores M.D.   On: 01/15/2017 13:26    Procedures Procedures (including critical care time)  Medications Ordered in ED Medications  iopamidol (ISOVUE-370) 76 % injection (100 mLs Intravenous Contrast Given 01/15/17 1552)     Initial Impression / Assessment and Plan / ED Course  I have reviewed the triage vital signs and the nursing notes.  Pertinent labs & imaging results that were available during my care of the patient were reviewed by me and considered in my medical decision making (see chart for details).  83 year old male with chest pain, hypoxia, and metastatic colon cancer. He has mild tachycardia and hypoxia which is improved with 2L O2. Sats dropped to 85% on RA. Chest pain is improving but on reassessment he has right sided chest pain as well. Labs are overall unremarkable. EKG is unchanged. Troponin is 0.02. CXR shows chronic changes. D-dimer is 3.55. Due to high risk of PE, CTA was ordered which is pending at shift change. Care signed out to W  Dansie PA-C who will determine dispo.   Final Clinical  Impressions(s) / ED Diagnoses   Final diagnoses:  Community acquired pneumonia of right upper lobe of lung ([place])  Hypoxia  Precordial pain    New Prescriptions New Prescriptions   No medications on file     Iris Pert 01/16/17 7897    Virgel Manifold, MD 01/17/17 1200

## 2017-01-15 NOTE — ED Notes (Signed)
Attempted report 

## 2017-01-15 NOTE — ED Triage Notes (Signed)
Per EMS: pt from home c/o left sided CP and noted to be in afib; L AC 20g; pt with hx of same; pt given 324mg  ASA in route

## 2017-01-15 NOTE — ED Notes (Signed)
Patient transported to CT 

## 2017-01-16 DIAGNOSIS — Z955 Presence of coronary angioplasty implant and graft: Secondary | ICD-10-CM | POA: Diagnosis not present

## 2017-01-16 DIAGNOSIS — I350 Nonrheumatic aortic (valve) stenosis: Secondary | ICD-10-CM | POA: Diagnosis present

## 2017-01-16 DIAGNOSIS — C20 Malignant neoplasm of rectum: Secondary | ICD-10-CM | POA: Diagnosis present

## 2017-01-16 DIAGNOSIS — Z803 Family history of malignant neoplasm of breast: Secondary | ICD-10-CM | POA: Diagnosis not present

## 2017-01-16 DIAGNOSIS — R072 Precordial pain: Secondary | ICD-10-CM | POA: Diagnosis present

## 2017-01-16 DIAGNOSIS — R0902 Hypoxemia: Secondary | ICD-10-CM

## 2017-01-16 DIAGNOSIS — J470 Bronchiectasis with acute lower respiratory infection: Secondary | ICD-10-CM | POA: Diagnosis present

## 2017-01-16 DIAGNOSIS — K219 Gastro-esophageal reflux disease without esophagitis: Secondary | ICD-10-CM | POA: Diagnosis present

## 2017-01-16 DIAGNOSIS — K2270 Barrett's esophagus without dysplasia: Secondary | ICD-10-CM | POA: Diagnosis present

## 2017-01-16 DIAGNOSIS — Z9049 Acquired absence of other specified parts of digestive tract: Secondary | ICD-10-CM | POA: Diagnosis not present

## 2017-01-16 DIAGNOSIS — F0390 Unspecified dementia without behavioral disturbance: Secondary | ICD-10-CM | POA: Diagnosis present

## 2017-01-16 DIAGNOSIS — I2510 Atherosclerotic heart disease of native coronary artery without angina pectoris: Secondary | ICD-10-CM | POA: Diagnosis present

## 2017-01-16 DIAGNOSIS — E785 Hyperlipidemia, unspecified: Secondary | ICD-10-CM | POA: Diagnosis present

## 2017-01-16 DIAGNOSIS — J189 Pneumonia, unspecified organism: Secondary | ICD-10-CM | POA: Diagnosis present

## 2017-01-16 DIAGNOSIS — Z8249 Family history of ischemic heart disease and other diseases of the circulatory system: Secondary | ICD-10-CM | POA: Diagnosis not present

## 2017-01-16 DIAGNOSIS — Z87442 Personal history of urinary calculi: Secondary | ICD-10-CM | POA: Diagnosis not present

## 2017-01-16 DIAGNOSIS — I5022 Chronic systolic (congestive) heart failure: Secondary | ICD-10-CM | POA: Diagnosis present

## 2017-01-16 DIAGNOSIS — I130 Hypertensive heart and chronic kidney disease with heart failure and stage 1 through stage 4 chronic kidney disease, or unspecified chronic kidney disease: Secondary | ICD-10-CM | POA: Diagnosis present

## 2017-01-16 DIAGNOSIS — Y95 Nosocomial condition: Secondary | ICD-10-CM | POA: Diagnosis present

## 2017-01-16 DIAGNOSIS — K769 Liver disease, unspecified: Secondary | ICD-10-CM | POA: Diagnosis present

## 2017-01-16 DIAGNOSIS — J181 Lobar pneumonia, unspecified organism: Secondary | ICD-10-CM | POA: Diagnosis not present

## 2017-01-16 DIAGNOSIS — N183 Chronic kidney disease, stage 3 (moderate): Secondary | ICD-10-CM | POA: Diagnosis present

## 2017-01-16 DIAGNOSIS — Z87891 Personal history of nicotine dependence: Secondary | ICD-10-CM | POA: Diagnosis not present

## 2017-01-16 DIAGNOSIS — I252 Old myocardial infarction: Secondary | ICD-10-CM | POA: Diagnosis not present

## 2017-01-16 DIAGNOSIS — I480 Paroxysmal atrial fibrillation: Secondary | ICD-10-CM | POA: Diagnosis present

## 2017-01-16 DIAGNOSIS — J439 Emphysema, unspecified: Secondary | ICD-10-CM | POA: Diagnosis present

## 2017-01-16 DIAGNOSIS — D539 Nutritional anemia, unspecified: Secondary | ICD-10-CM | POA: Diagnosis present

## 2017-01-16 DIAGNOSIS — E1122 Type 2 diabetes mellitus with diabetic chronic kidney disease: Secondary | ICD-10-CM | POA: Diagnosis present

## 2017-01-16 LAB — CBC
HCT: 29.1 % — ABNORMAL LOW (ref 39.0–52.0)
Hemoglobin: 9.6 g/dL — ABNORMAL LOW (ref 13.0–17.0)
MCH: 34.3 pg — AB (ref 26.0–34.0)
MCHC: 33 g/dL (ref 30.0–36.0)
MCV: 103.9 fL — AB (ref 78.0–100.0)
PLATELETS: 157 10*3/uL (ref 150–400)
RBC: 2.8 MIL/uL — ABNORMAL LOW (ref 4.22–5.81)
RDW: 13 % (ref 11.5–15.5)
WBC: 6 10*3/uL (ref 4.0–10.5)

## 2017-01-16 LAB — GLUCOSE, CAPILLARY
GLUCOSE-CAPILLARY: 165 mg/dL — AB (ref 65–99)
Glucose-Capillary: 118 mg/dL — ABNORMAL HIGH (ref 65–99)
Glucose-Capillary: 153 mg/dL — ABNORMAL HIGH (ref 65–99)
Glucose-Capillary: 154 mg/dL — ABNORMAL HIGH (ref 65–99)

## 2017-01-16 LAB — BASIC METABOLIC PANEL
Anion gap: 6 (ref 5–15)
BUN: 13 mg/dL (ref 6–20)
CO2: 24 mmol/L (ref 22–32)
CREATININE: 1 mg/dL (ref 0.61–1.24)
Calcium: 8.2 mg/dL — ABNORMAL LOW (ref 8.9–10.3)
Chloride: 104 mmol/L (ref 101–111)
GFR calc non Af Amer: >60 mL/min (ref >60)
Glucose, Bld: 117 mg/dL — ABNORMAL HIGH (ref 65–99)
Potassium: 4 mmol/L (ref 3.5–5.1)
SODIUM: 134 mmol/L — AB (ref 135–145)

## 2017-01-16 MED ORDER — IPRATROPIUM-ALBUTEROL 0.5-2.5 (3) MG/3ML IN SOLN: 3.0000 mL | RESPIRATORY_TRACT | Status: DC | PRN

## 2017-01-16 MED ORDER — IPRATROPIUM-ALBUTEROL 0.5-2.5 (3) MG/3ML IN SOLN
3.0000 mL | Freq: Four times a day (QID) | RESPIRATORY_TRACT | Status: DC
  Administered 2017-01-16 – 2017-01-17 (×3): 3 mL via RESPIRATORY_TRACT
  Filled 2017-01-16 (×3): qty 3

## 2017-01-16 NOTE — Progress Notes (Signed)
PROGRESS NOTE    [name]  EHU:[id number] DOB: [birth year] DOA: 01/15/2017 PCP: Lavone Orn, MD    Brief Narrative:  83 year old male who presented with chest pain. Patient has history of coronary disease status post stent July 2012, hypertension, dyslipidemia, type 2 diabetes mellitus, atrial fibrillation and recurrent colon cancer and rectal cancer stage IV. Presents with 2 days of left to right sided chest pain, constant, associated with dyspnea, and productive cough, no fevers, no nausea or vomiting. On initial physical examination blood pressure 161/97, heart rate 80, pulse rate 22, oxygen saturation 98%, temperature 97.8. Moist mucous membranes, lungs had scattered rails, more on the right side, good air movement, heart S1-S2 present irregularly irregular, no gallops, rubs or murmurs, the abdomen was soft nontender, no lower extremity edema. Chest x-ray with right upper lobe pneumonia, CT chest with significant emphysematous changes, large bullous disease, infiltrate right upper lobe. EKG with a right axis deviation, sinus rhythm, right axis, positive premature ventricular complexes.  Patient admitted to the hospital working diagnosis of community-acquired pneumonia, right lower lobe.  Assessment & Plan:   Principal Problem:   CAP (community acquired pneumonia) Active Problems:   Paroxysmal atrial fibrillation (HCC)   History of non-ST elevation myocardial infarction (NSTEMI)   Heart failure with reduced ejection fraction ([place])   Aortic stenosis   Rectal cancer ([place])   1. Right upper lobe, community-acquired pneumonia. Chest film and CT personally reviewed, noted significant bullae disease, will continue antibiotic therapy with ceftriaxone and azithromycin, added bronchodilator therapy q 6 hours and as needed q 2 hours. Continue oxymetry monitoring and supplemental 02 per [place]. Will need ambulatory oxymetry on room air before discharge. Follow with sputum culture, urine strep and  legionella antigens.   2. COPD with emphysema and bronchiectasis. Will continue bronchodilator therapy, will benefit from outpatient pulmonary function testing.   3. Paroxysmal atrial fibrillation. Continue rate control with carvedilol, anticoagulation with aspirin.   4. Hypertension. Continue blood pressure control with amlodipine, isosorbide and coreg.   5. Type 2 diabetes mellitus. Will continue glucose cover and monitoring with iss, capillary glucose   6. Rectal cancer stage IV. No active therapy for now, follow as outpatient.   7. Systolic heart failure. Chronic and stable, patient clinically euvolemic.   8. Aortic stenosis. Stable, no signs of heart failure, no angina or syncope.   DVT prophylaxis: enoxaparin  Code Status: full Family Communication:  Disposition Plan:  Home   Consultants:     Procedures:     Antimicrobials:       Subjective: Patient feeling better, dyspnea has improved but still present, at home uses cane and walker, not on supplemental 02. Denies having COPD.   Objective: Vitals:   01/15/17 1700 01/15/17 1800 01/15/17 1950 01/16/17 0920  BP: (!) 145/79 (!) 132/57 128/65 (!) 112/50  Pulse: 74 75 79 72  Resp: 19 20 17    Temp:   97.8 F (36.6 C)   TempSrc:   Oral   SpO2: 96% 100% 98%    No intake or output data in the 24 hours ending 01/16/17 1055 There were no vitals filed for this visit.  Examination:  General: deconditioned Neurology: Awake and alert, non focal  E ENT: mild pallor, no icterus, oral mucosa moist Cardiovascular: S1-S2 present, rhythmic, no gallops, rubs, or murmurs. No jugular venous distention, no lower extremity edema. Pulmonary: decreased breath sounds bilaterally, adequate air movement, no wheezing, rhonchi. Positive bibasilar rales. Gastrointestinal. Abdomen flat, no organomegaly, non tender, no rebound or  guarding Skin. No rashes Musculoskeletal: no joint deformities     Data Reviewed: I have personally  reviewed following labs and imaging studies  CBC:  Recent Labs Lab 01/11/17 1137 01/15/17 1239 01/16/17 0507  WBC 6.3 5.5 6.0  NEUTROABS 5.2  --   --   HGB 10.5* 10.7* 9.6*  HCT 31.8* 31.1* 29.1*  MCV 107.1* 103.3* 103.9*  PLT 166 155 431   Basic Metabolic Panel:  Recent Labs Lab 01/11/17 1138 01/15/17 1239 01/16/17 0507  NA 136 134* 134*  K 4.1 4.1 4.0  CL  --  103 104  CO2 27 23 24   GLUCOSE 256* 151* 117*  BUN 20.5 17 13   CREATININE 1.1 1.02 1.00  CALCIUM 9.0 8.6* 8.2*   GFR: Estimated Creatinine Clearance: 42.6 mL/min (by C-G formula based on SCr of 1 mg/dL). Liver Function Tests:  Recent Labs Lab 01/11/17 1138  AST 24  ALT 23  ALKPHOS 122  BILITOT 0.57  PROT 6.7  ALBUMIN 2.8*   No results for input(s): LIPASE, AMYLASE in the last 168 hours. No results for input(s): AMMONIA in the last 168 hours. Coagulation Profile: No results for input(s): INR, PROTIME in the last 168 hours. Cardiac Enzymes: No results for input(s): CKTOTAL, CKMB, CKMBINDEX, TROPONINI in the last 168 hours. BNP (last 3 results) No results for input(s): PROBNP in the last 8760 hours. HbA1C: No results for input(s): HGBA1C in the last 72 hours. CBG:  Recent Labs Lab 01/15/17 2045 01/16/17 0754  GLUCAP 176* 118*   Lipid Profile: No results for input(s): CHOL, HDL, LDLCALC, TRIG, CHOLHDL, LDLDIRECT in the last 72 hours. Thyroid Function Tests: No results for input(s): TSH, T4TOTAL, FREET4, T3FREE, THYROIDAB in the last 72 hours. Anemia Panel: No results for input(s): VITAMINB12, FOLATE, FERRITIN, TIBC, IRON, RETICCTPCT in the last 72 hours.    Radiology Studies: I have reviewed all of the imaging during this hospital visit personally     Scheduled Meds: . amLODipine  2.5 mg Oral Daily  . aspirin EC  81 mg Oral Daily  . atorvastatin  40 mg Oral Daily  . azithromycin  250 mg Oral Q24H  . B-complex with vitamin C  1 tablet Oral Daily  . carvedilol  3.125 mg Oral BID  WC  . donepezil  10 mg Oral QHS  . enoxaparin (LOVENOX) injection  40 mg Subcutaneous Q24H  . finasteride  5 mg Oral QHS  . insulin aspart  0-9 Units Subcutaneous TID WC  . isosorbide mononitrate  30 mg Oral Daily  . pantoprazole  40 mg Oral Daily  . ranolazine  1,000 mg Oral BID   Continuous Infusions: . cefTRIAXone (ROCEPHIN)  IV       LOS: 0 days        Mauricio Gerome Apley, MD [facility] Pager [phone]

## 2017-01-16 NOTE — Evaluation (Signed)
Physical Therapy Evaluation Patient Details Name: [name] MRN: [id number] DOB: 09-21-1923 Today's Date: 01/16/2017   History of Present Illness  Patient is a 83 yo male admitted 01/15/17 with chest discomfort.  Patient with CAP and HTN.   PMH:  CAD s/p stent 11/2010, HTN, HLD, t2DM, atrial fibrillation, AS, CHF, MI, CKD, depression  Clinical Impression  Patient is functioning at Mod I to independent level with mobility and gait.  No loss of balance during gait.  Patient at baseline functional level.  No further acute PT needs identified - PT will sign off.    Follow Up Recommendations No PT follow up;Supervision - Intermittent    Equipment Recommendations  None recommended by PT    Recommendations for Other Services       Precautions / Restrictions Precautions Precautions: None Restrictions Weight Bearing Restrictions: No      Mobility  Bed Mobility               General bed mobility comments: Patient in chair as PT entered room  Transfers Overall transfer level: Independent Equipment used: None                Ambulation/Gait Ambulation/Gait assistance: Modified independent (Device/Increase time) Ambulation Distance (Feet): 80 Feet Assistive device: None Gait Pattern/deviations: Step-through pattern;Decreased stride length;Trunk flexed   Gait velocity interpretation: at or above normal speed for age/gender General Gait Details: Patient with good gait pattern.  No loss of balance during gait with no assistive device.  Patient at baseline.  Stairs            Wheelchair Mobility    Modified Rankin (Stroke Patients Only)       Balance Overall balance assessment: Modified Independent                                           Pertinent Vitals/Pain Pain Assessment: No/denies pain    Home Living Family/patient expects to be discharged to:: Private residence Living Arrangements: Alone Available Help at Discharge:  Personal care attendant;Family;Friend(s);Available PRN/intermittently ("Sitter" 5 days/week 9:00-5:00) Type of Home: House (Condo) Home Access: Level entry     Home Layout: One level Home Equipment: [place] - single point;Walker - 2 wheels;Shower seat - built in      Prior Function Level of Independence: Independent with assistive device(s);Needs assistance   Gait / Transfers Assistance Needed: Uses cane "rarely" when feels weak.  ADL's / Homemaking Assistance Needed: Kennon Holter assists with housekeeping, laundry, meals.  Patient independent with bathing, dressing, meals when sitter not present.  Comments: Patient drives.  States "doctor told me I can't drive for 6 weeks now.  My sister will help me"     Hand Dominance        Extremity/Trunk Assessment   Upper Extremity Assessment Upper Extremity Assessment: Overall WFL for tasks assessed    Lower Extremity Assessment Lower Extremity Assessment: Overall WFL for tasks assessed    Cervical / Trunk Assessment Cervical / Trunk Assessment: Normal  Communication   Communication: No difficulties  Cognition Arousal/Alertness: Awake/alert Behavior During Therapy: WFL for tasks assessed/performed;Agitated Overall Cognitive Status: No family/caregiver present to determine baseline cognitive functioning                                 General Comments: Patient appears [facility] for basic tasks completed  today.  Did have difficulty with time (date/month)      General Comments      Exercises     Assessment/Plan    PT Assessment Patent does not need any further PT services  PT Problem List Decreased strength;Cardiopulmonary status limiting activity       PT Treatment Interventions      PT Goals (Current goals can be found in the Care Plan section)  Acute Rehab PT Goals Patient Stated Goal: go home ASAP PT Goal Formulation: All assessment and education complete, DC therapy    Frequency     Barriers to discharge         Co-evaluation               AM-PAC PT "6 Clicks" Daily Activity  Outcome Measure Difficulty turning over in bed (including adjusting bedclothes, sheets and blankets)?: None Difficulty moving from lying on back to sitting on the side of the bed? : None Difficulty sitting down on and standing up from a chair with arms (e.g., wheelchair, bedside commode, etc,.)?: None Help needed moving to and from a bed to chair (including a wheelchair)?: None Help needed walking in hospital room?: None Help needed climbing 3-5 steps with a railing? : A Little 6 Click Score: 23    End of Session Equipment Utilized During Treatment: Gait belt;Oxygen Activity Tolerance: Patient tolerated treatment well Patient left: in chair;with call bell/phone within reach;with chair alarm set   PT Visit Diagnosis: Unsteadiness on feet (R26.81);Muscle weakness (generalized) (M62.81)    Time: 1840-3754 PT Time Calculation (min) (ACUTE ONLY): 16 min   Charges:   PT Evaluation $PT Eval Moderate Complexity: 1 Mod     PT G Codes:   PT G-Codes **NOT FOR INPATIENT CLASS** Functional Assessment Tool Used: AM-PAC 6 Clicks Basic Mobility;Clinical judgement Functional Limitation: Mobility: Walking and moving around Mobility: Walking and Moving Around Current Status (H6067): 0 percent impaired, limited or restricted Mobility: Walking and Moving Around Goal Status (P0340): 0 percent impaired, limited or restricted Mobility: Walking and Moving Around Discharge Status (B5248): 0 percent impaired, limited or restricted    Carita Pian. Sanjuana Kava, [facility] Pager [phone]  Despina Pole 01/16/2017, 1:52 PM

## 2017-01-17 ENCOUNTER — Ambulatory Visit: Payer: Medicare Other | Admitting: Hematology

## 2017-01-17 ENCOUNTER — Ambulatory Visit
Admission: RE | Admit: 2017-01-17 | Discharge: 2017-01-17 | Disposition: A | Discharge status: Home / Self Care | Payer: Medicare Other | Source: Ambulatory Visit | Attending: Radiation Oncology | Admitting: Radiation Oncology

## 2017-01-17 ENCOUNTER — Other Ambulatory Visit: Payer: Medicare Other

## 2017-01-17 DIAGNOSIS — I480 Paroxysmal atrial fibrillation: Secondary | ICD-10-CM

## 2017-01-17 DIAGNOSIS — J439 Emphysema, unspecified: Secondary | ICD-10-CM

## 2017-01-17 LAB — CBC WITH DIFFERENTIAL/PLATELET
BASOS ABS: 0 10*3/uL (ref 0.0–0.1)
BASOS PCT: 0 %
EOS ABS: 0.2 10*3/uL (ref 0.0–0.7)
Eosinophils Relative: 4 %
HEMATOCRIT: 29 % — AB (ref 39.0–52.0)
Hemoglobin: 9.5 g/dL — ABNORMAL LOW (ref 13.0–17.0)
Lymphocytes Relative: 11 %
Lymphs Abs: 0.7 10*3/uL (ref 0.7–4.0)
MCH: 33.9 pg (ref 26.0–34.0)
MCHC: 32.8 g/dL (ref 30.0–36.0)
MCV: 103.6 fL — ABNORMAL HIGH (ref 78.0–100.0)
MONO ABS: 0.6 10*3/uL (ref 0.1–1.0)
Monocytes Relative: 11 %
NEUTROS ABS: 4.3 10*3/uL (ref 1.7–7.7)
NEUTROS PCT: 74 %
Platelets: 155 10*3/uL (ref 150–400)
RBC: 2.8 MIL/uL — ABNORMAL LOW (ref 4.22–5.81)
RDW: 12.8 % (ref 11.5–15.5)
WBC: 5.8 10*3/uL (ref 4.0–10.5)

## 2017-01-17 LAB — BASIC METABOLIC PANEL
ANION GAP: 5 (ref 5–15)
BUN: 15 mg/dL (ref 6–20)
CALCIUM: 8.5 mg/dL — AB (ref 8.9–10.3)
CO2: 25 mmol/L (ref 22–32)
CREATININE: 1.07 mg/dL (ref 0.61–1.24)
Chloride: 105 mmol/L (ref 101–111)
GFR, EST NON AFRICAN AMERICAN: 59 mL/min — AB (ref >60)
Glucose, Bld: 105 mg/dL — ABNORMAL HIGH (ref 65–99)
Potassium: 3.9 mmol/L (ref 3.5–5.1)
SODIUM: 135 mmol/L (ref 135–145)

## 2017-01-17 LAB — GLUCOSE, CAPILLARY
GLUCOSE-CAPILLARY: 114 mg/dL — AB (ref 65–99)
GLUCOSE-CAPILLARY: 159 mg/dL — AB (ref 65–99)

## 2017-01-17 MED ORDER — LEVOFLOXACIN 500 MG PO TABS
500.0000 mg | ORAL_TABLET | Freq: Every day | ORAL | Status: DC
  Administered 2017-01-17: 500 mg via ORAL
  Filled 2017-01-17: qty 1

## 2017-01-17 MED ORDER — IPRATROPIUM-ALBUTEROL 0.5-2.5 (3) MG/3ML IN SOLN: 3.0000 mL | RESPIRATORY_TRACT | Status: DC | PRN

## 2017-01-17 MED ORDER — LEVOFLOXACIN 500 MG PO TABS: 500.0000 mg | ORAL_TABLET | Freq: Every day | ORAL | 0 refills | Status: DC

## 2017-01-17 MED ORDER — LEVOFLOXACIN 500 MG PO TABS: 250.0000 mg | ORAL_TABLET | Freq: Every day | ORAL | Status: DC

## 2017-01-17 MED ORDER — IPRATROPIUM-ALBUTEROL 0.5-2.5 (3) MG/3ML IN SOLN: 3.0000 mL | Freq: Four times a day (QID) | RESPIRATORY_TRACT | 0 refills | Status: DC | PRN

## 2017-01-17 NOTE — Discharge Summary (Signed)
Physician Discharge Summary  [name] NWG:[id number] DOB: 1924/02/06 DOA: 01/15/2017  PCP: Lavone Orn, MD  Admit date: 01/15/2017 Discharge date: 01/17/2017  Admitted From: Home  Disposition:  Home  Recommendations for Outpatient Follow-up:  1. Follow up with PCP in 1- week 2. Patient placed on Levofloxacin for 7 more days 3. Prescribed bronchodilator therapy 4. Needs outpatient pulmonary function testing., CT with positive emphysematous changes and bronchiectasis 5. Follow up on: 8 mm pulmonary nodule within the left upper lobe   Home Health: NA Equipment/Devices: Nebulizer machine   Discharge Condition: Stable CODE STATUS: Full  Diet recommendation: Cardiac and diabetic  Brief/Interim Summary: 83 year old male who presented with chest pain. Patient has history of coronary disease status post stent July 2012, hypertension, dyslipidemia, type 2 diabetes mellitus, atrial fibrillation and recurrent colon and rectal cancer stage IV. Presents with 2 days of left to right sided chest pain, constant, associated with dyspnea, and productive cough, no fevers, no nausea or vomiting. On initial physical examination blood pressure 161/97, heart rate 80, pulse rate 22, oxygen saturation 98%, temperature 97.8. Moist mucous membranes, lungs had scattered rails, more on the right side, good air movement, heart S1-S2 present irregularly irregular, no gallops, rubs or murmurs, the abdomen was soft nontender, no lower extremity edema. Chest x-ray with right upper lobe pneumonia, CT chest with significant emphysematous changes, large bullous disease, infiltrate right upper lobe. EKG with a right axis deviation, right bundle branch block, sinus rhythm, positive premature ventricular complexes.  Patient admitted to the hospital working diagnosis of community-acquired pneumonia, right upper lobe.  1. Right upper lobe community, pneumonia. Patient was admitted to the medical ward, he was placed on  intravenous antibiotics with ceftriaxone and azithromycin. Oximetry monitors pathology per nasal cannula, bronchodilator therapy with DuoNeb's. Responded well to the medical therapy, he was deemed stable to discharge by September 10th, he will continue antibiotic therapy with levofloxacin for 7 more days.  2. COPD, with emphysema and bronchiectasis. Remote history of tobacco abuse, patient will be discharge with bronchodilator therapy to use as needed, he will need outpatient pulmonary function testing. Ambulatory oximetry on room and will be performed before discharge.  3. Paroxysmal atrial fibrillation. It remained well-controlled with carvedilol, anticoagulation with aspirin.  4. Hypertension. Remained well-controlled with amlodipine, isosorbide and Coreg.  5. Type 2 diabetes mellitus. Patient was placed on insulin signed scale for glucose coverage and monitoring, capillary glucose remained well-controlled. Patient will resume oral hypoglycemic medications (glimepiride) and trajenda at discharge.  6. Systolic heart failure. Chronic, stable, no clinical signs of exacerbation, continue beta blockade with Coreg.  7. Aortic stenosis. Stable, no signs of decompensation.  8. Colonic and Rectal cancer. Adenocarcinoma stage IV. Continue outpatient palliative rotation therapy.   Discharge Diagnoses:  Principal Problem:   CAP (community acquired pneumonia) Active Problems:   Paroxysmal atrial fibrillation (HCC)   History of non-ST elevation myocardial infarction (NSTEMI)   Heart failure with reduced ejection fraction [facility])   Aortic stenosis   Rectal cancer ([place])   Pneumonia    Discharge Instructions   Allergies as of 01/17/2017      Reactions   Metformin And Related Diarrhea, Nausea And Vomiting      Medication List    STOP taking these medications   oxyCODONE 5 MG immediate release tablet Commonly known as:  Oxy IR/ROXICODONE     TAKE these medications   acetaminophen 500 MG  tablet Commonly known as:  TYLENOL Take 1,000 mg by mouth every 6 (six) hours  as needed (shoulder pain).   amLODipine 2.5 MG tablet Commonly known as:  NORVASC Take 1 tablet (2.5 mg total) by mouth daily.   aspirin EC 81 MG tablet Take 1 tablet (81 mg total) by mouth daily.   atorvastatin 40 MG tablet Commonly known as:  LIPITOR Take 40 mg by mouth daily.   b complex vitamins tablet Take 1 tablet by mouth daily.   carvedilol 3.125 MG tablet Commonly known as:  COREG Take 1 tablet (3.125 mg total) by mouth 2 (two) times daily with a meal.   donepezil 10 MG tablet Commonly known as:  ARICEPT Take 10 mg by mouth at bedtime.   finasteride 5 MG tablet Commonly known as:  PROSCAR Take 5 mg by mouth at bedtime.   glimepiride 1 MG tablet Commonly known as:  AMARYL Take 1 mg by mouth daily with breakfast.   ipratropium-albuterol 0.5-2.5 (3) MG/3ML Soln Commonly known as:  DUONEB Take 3 mLs by nebulization every 6 (six) hours as needed (shortness of breath or wheezing).   isosorbide mononitrate 30 MG 24 hr tablet Commonly known as:  IMDUR Take 1 tablet (30 mg total) by mouth daily.   levofloxacin 500 MG tablet Commonly known as:  LEVAQUIN Take 1 tablet (500 mg total) by mouth daily.   linagliptin 5 MG Tabs tablet Commonly known as:  TRADJENTA Take 1 tablet (5 mg total) by mouth daily. For blood sugar What changed:  additional instructions   nitroGLYCERIN 0.4 MG SL tablet Commonly known as:  NITROSTAT PLACE ONE TABLET UNDER THE TONGUE EVERY 5 MINUTES AS NEEDED FOR CHEST PAIN. What changed:  See the new instructions.   pantoprazole 40 MG tablet Commonly known as:  PROTONIX Take 40 mg by mouth daily.   ranolazine 1000 MG SR tablet Commonly known as:  RANEXA Take 1 tablet (1,000 mg total) by mouth 2 (two) times daily.   SYSTANE BALANCE 0.6 % Soln Generic drug:  Propylene Glycol Place 1-2 drops into both eyes 3 (three) times daily as needed (for dry eyes).             Durable Medical Equipment        Start     Ordered   01/17/17 0000  DME Nebulizer machine    Question:  Patient needs a nebulizer to treat with the following condition  Answer:  COPD (chronic obstructive pulmonary disease) with emphysema ([place])   01/17/17 1057       Discharge Care Instructions        Start     Ordered   01/17/17 0000  ipratropium-albuterol (DUONEB) 0.5-2.5 (3) MG/3ML SOLN  Every 6 hours PRN     01/17/17 1057   01/17/17 0000  DME Nebulizer machine    Question:  Patient needs a nebulizer to treat with the following condition  Answer:  COPD (chronic obstructive pulmonary disease) with emphysema ([place])   01/17/17 1057   01/17/17 0000  levofloxacin (LEVAQUIN) 500 MG tablet  Daily     01/17/17 1057      Allergies  Allergen Reactions  . Metformin And Related Diarrhea and Nausea And Vomiting    Consultations:     Procedures/Studies: Ct Abdomen Pelvis Wo Contrast  Result Date: 12/24/2016 CLINICAL DATA:  Left posterior rectal mass. Remote history of stage III colon cancer. EXAM: CT CHEST, ABDOMEN AND PELVIS WITHOUT CONTRAST TECHNIQUE: Multidetector CT imaging of the chest, abdomen and pelvis was performed following the standard protocol without IV contrast. COMPARISON:  Abdomen and pelvis  CT 05/08/2013 FINDINGS: CT CHEST FINDINGS Cardiovascular: Cardiopericardial silhouette is at upper limits of normal for size. Coronary artery calcification is noted. Atherosclerotic calcification is noted in the wall of the thoracic aorta. Mediastinum/Nodes: Upper normal mediastinal lymph nodes are evident, some of which show dense dystrophic calcification compatible with prior granulomatous disease. No evidence for gross hilar lymphadenopathy although assessment is limited by the lack of intravenous contrast on today's study. The esophagus has normal imaging features. There is no axillary lymphadenopathy. Lungs/Pleura: Fairly advanced changes of underlying chronic interstitial  lung disease with areas of centrilobular paraseptal emphysema. There is marked bronchial wall thickening with bilateral cylindrical bronchiectasis. Advanced changes in the right upper lobe show bullous change, septal thickening and areas of dependent fluid within large thin walled air cysts. Scattered areas of apparent subpleural honeycombing are noted in the lungs bilaterally. Musculoskeletal: Bone windows reveal no worrisome lytic or sclerotic osseous lesions. CT ABDOMEN PELVIS FINDINGS Hepatobiliary: Multiple ill-defined low-density lesions are seen distributed through both hepatic lobes, new since the prior study. Index lesion in the posterior right hepatic dome (image 87 series 2) measures 1.7 cm. A second discrete lesion identified in the posterior right liver (image 98) measures 2.4 cm. Medial segment left liver lesions seen on image 105 measures 1.8 cm. Gallbladder surgically absent. No intrahepatic or extrahepatic biliary dilation. Pancreas: The No focal mass lesion. No dilatation of the main duct. No intraparenchymal cyst. No peripancreatic edema. Spleen: Calcified granulomata. Adrenals/Urinary Tract: No adrenal nodule or mass. Several stones are seen in the right kidney with dense atherosclerotic calcification noted in artery is of the renal hilum. Dominant stone is in the lower pole measuring up to 7 mm maximum diameter without evidence for obstruction. Left kidney also demonstrates multiple stones, the largest of which is nonobstructing in a lower pole calyx and measures 12 x 13 x 12 mm. 2.6 cm lesion in the lower pole of the left kidney approaches water attenuation and is likely a cyst. Other smaller low-density lesions in the left kidney measure about 10 mm and have near water attenuation, also likely cysts. No evidence for hydroureter. The urinary bladder appears normal for the degree of distention. Stomach/Bowel: Stomach is nondistended. No gastric wall thickening. No evidence of outlet obstruction.  Duodenum is normally positioned as is the ligament of Treitz. No small bowel wall thickening. No small bowel dilatation. Patient is status post right hemicolectomy with unremarkable appearing ileocolic anastomosis. Diverticular changes are noted in the left colon without evidence of diverticulitis. Prominent soft tissue fullness in the distal rectum near the anal verge presumably reflects known neoplasm. Vascular/Lymphatic: There is abdominal aortic atherosclerosis without aneurysm. Maximum aortic diameter measured at 2.9 cm. There is no gastrohepatic or hepatoduodenal ligament lymphadenopathy. No intraperitoneal or retroperitoneal lymphadenopathy. No pelvic sidewall lymphadenopathy. Specifically, no evidence for perirectal lymphadenopathy. Reproductive: The prostate gland and seminal vesicles have normal imaging features. Other: No intraperitoneal free fluid. Musculoskeletal: Bilateral groin hernias contain only fat. Bone windows reveal no worrisome lytic or sclerotic osseous lesions. IMPRESSION: 1. Prominent soft tissue fullness distal rectum near the anal verge presumably reflects known neoplasm. No evidence for perirectal lymphadenopathy. 2. Multiple ill-defined low-density lesions in the liver, new since prior study of 2014. These have day, ill-defined margins and are highly suspicious for metastatic disease. Dominant lesion measures up to 2.4 cm. 3. Advanced chronic changes in the lungs with areas of marked bullous change in the right upper lobe, some of the bullous containing fluid levels. There is diffuse bronchial wall thickening,  bronchiectasis, and features suggesting underlying UIP. 4.  Aortic Atherosclerois (ICD10-170.0) Electronically Signed   By: Misty Stanley M.D.   On: 12/24/2016 08:25   Dg Chest 2 View  Result Date: 01/01/2017 CLINICAL DATA:  Bradycardia EXAM: CHEST  2 VIEW COMPARISON:  10/06/2016 FINDINGS: Severe chronic interstitial lung disease again noted. Heart is normal size. No  effusions. No acute bony abnormality. No definite acute process. IMPRESSION: Severe chronic interstitial lung disease. No definite acute process. Electronically Signed   By: Rolm Baptise M.D.   On: 01/01/2017 17:43   Ct Chest Wo Contrast  Result Date: 12/24/2016 CLINICAL DATA:  Left posterior rectal mass. Remote history of stage III colon cancer. EXAM: CT CHEST, ABDOMEN AND PELVIS WITHOUT CONTRAST TECHNIQUE: Multidetector CT imaging of the chest, abdomen and pelvis was performed following the standard protocol without IV contrast. COMPARISON:  Abdomen and pelvis CT 05/08/2013 FINDINGS: CT CHEST FINDINGS Cardiovascular: Cardiopericardial silhouette is at upper limits of normal for size. Coronary artery calcification is noted. Atherosclerotic calcification is noted in the wall of the thoracic aorta. Mediastinum/Nodes: Upper normal mediastinal lymph nodes are evident, some of which show dense dystrophic calcification compatible with prior granulomatous disease. No evidence for gross hilar lymphadenopathy although assessment is limited by the lack of intravenous contrast on today's study. The esophagus has normal imaging features. There is no axillary lymphadenopathy. Lungs/Pleura: Fairly advanced changes of underlying chronic interstitial lung disease with areas of centrilobular paraseptal emphysema. There is marked bronchial wall thickening with bilateral cylindrical bronchiectasis. Advanced changes in the right upper lobe show bullous change, septal thickening and areas of dependent fluid within large thin walled air cysts. Scattered areas of apparent subpleural honeycombing are noted in the lungs bilaterally. Musculoskeletal: Bone windows reveal no worrisome lytic or sclerotic osseous lesions. CT ABDOMEN PELVIS FINDINGS Hepatobiliary: Multiple ill-defined low-density lesions are seen distributed through both hepatic lobes, new since the prior study. Index lesion in the posterior right hepatic dome (image 87  series 2) measures 1.7 cm. A second discrete lesion identified in the posterior right liver (image 98) measures 2.4 cm. Medial segment left liver lesions seen on image 105 measures 1.8 cm. Gallbladder surgically absent. No intrahepatic or extrahepatic biliary dilation. Pancreas: The No focal mass lesion. No dilatation of the main duct. No intraparenchymal cyst. No peripancreatic edema. Spleen: Calcified granulomata. Adrenals/Urinary Tract: No adrenal nodule or mass. Several stones are seen in the right kidney with dense atherosclerotic calcification noted in artery is of the renal hilum. Dominant stone is in the lower pole measuring up to 7 mm maximum diameter without evidence for obstruction. Left kidney also demonstrates multiple stones, the largest of which is nonobstructing in a lower pole calyx and measures 12 x 13 x 12 mm. 2.6 cm lesion in the lower pole of the left kidney approaches water attenuation and is likely a cyst. Other smaller low-density lesions in the left kidney measure about 10 mm and have near water attenuation, also likely cysts. No evidence for hydroureter. The urinary bladder appears normal for the degree of distention. Stomach/Bowel: Stomach is nondistended. No gastric wall thickening. No evidence of outlet obstruction. Duodenum is normally positioned as is the ligament of Treitz. No small bowel wall thickening. No small bowel dilatation. Patient is status post right hemicolectomy with unremarkable appearing ileocolic anastomosis. Diverticular changes are noted in the left colon without evidence of diverticulitis. Prominent soft tissue fullness in the distal rectum near the anal verge presumably reflects known neoplasm. Vascular/Lymphatic: There is abdominal aortic atherosclerosis without  aneurysm. Maximum aortic diameter measured at 2.9 cm. There is no gastrohepatic or hepatoduodenal ligament lymphadenopathy. No intraperitoneal or retroperitoneal lymphadenopathy. No pelvic sidewall  lymphadenopathy. Specifically, no evidence for perirectal lymphadenopathy. Reproductive: The prostate gland and seminal vesicles have normal imaging features. Other: No intraperitoneal free fluid. Musculoskeletal: Bilateral groin hernias contain only fat. Bone windows reveal no worrisome lytic or sclerotic osseous lesions. IMPRESSION: 1. Prominent soft tissue fullness distal rectum near the anal verge presumably reflects known neoplasm. No evidence for perirectal lymphadenopathy. 2. Multiple ill-defined low-density lesions in the liver, new since prior study of 2014. These have day, ill-defined margins and are highly suspicious for metastatic disease. Dominant lesion measures up to 2.4 cm. 3. Advanced chronic changes in the lungs with areas of marked bullous change in the right upper lobe, some of the bullous containing fluid levels. There is diffuse bronchial wall thickening, bronchiectasis, and features suggesting underlying UIP. 4.  Aortic Atherosclerois (ICD10-170.0) Electronically Signed   By: Misty Stanley M.D.   On: 12/24/2016 08:25   Ct Angio Chest Pe W/cm &/or Wo Cm  Result Date: 01/15/2017 CLINICAL DATA:  Left-sided chest pain, atrial fibrillation. Elevated D-dimer. Newly diagnosed rectal cancer with liver lesions. EXAM: CT ANGIOGRAPHY CHEST WITH CONTRAST TECHNIQUE: Multidetector CT imaging of the chest was performed using the standard protocol during bolus administration of intravenous contrast. Multiplanar CT image reconstructions and MIPs were obtained to evaluate the vascular anatomy. CONTRAST:  100 cc Isovue 370 COMPARISON:  Chest CT dated 12/23/2016. FINDINGS: Cardiovascular: There is no pulmonary embolism identified within the main, lobar or segmental pulmonary arteries bilaterally. Aortic atherosclerosis. No aortic aneurysm. Heart size is upper normal. No pericardial effusion. Diffuse coronary artery calcifications. Mediastinum/Nodes: Scattered small and mildly prominent lymph nodes are again  seen within the mediastinum and perihilar regions, unchanged in the short-term interval. No obvious pathologically enlarged lymph nodes identified. Esophagus appears normal. Trachea and central bronchi are unremarkable,. Lungs/Pleura: Advanced changes of chronic interstitial lung disease are emphysema, not significantly changed compared to the recent chest CT. There is increased consolidation within the right upper lobe, at the posterior margin of previously described emphysematous change and cylindrical bronchiectasis, suspicious for developing pneumonia. 8 mm pulmonary nodule noted at the left lung apex, stable in the short-term interval new compared to earlier chest CT of 08/09/2010. Small bilateral pleural effusions. Upper Abdomen: Limited images of the upper abdomen are unremarkable, characterization limited by the early contrast phase, no appreciable contrast as yet in the upper abdomen at the time of imaging. The liver lesions described on earlier CT are not at able to be visualized. Musculoskeletal: No acute or suspicious osseous finding. Review of the MIP images confirms the above findings. IMPRESSION: 1. No pulmonary embolism identified. 2. Increased consolidation (compared to recent chest CT of 12/23/2016) within the right upper lobe at the posterior margin of previously described advanced emphysematous change and cylindrical bronchiectasis. This increased consolidation is highly suspicious for pneumonia. Atelectasis is considered less likely. 3. Advanced emphysematous changes and chronic interstitial lung disease, upper lobe predominant, with associated cylindrical bronchiectasis. 4. 8 mm pulmonary nodule within the left upper lobe (series 6, image 37). This most likely represents nodular atelectasis or fibrosis, however, neoplastic nodule is certainly not excluded in the setting of emphysema and known rectal cancer. PET-CT may be helpful for further characterization. At minimum, recommend follow-up  chest CT in 2-3 months to ensure stability or resolution. 5. Small bilateral pleural effusions. 6. Aortic atherosclerosis. 7. Diffuse coronary artery calcifications. Recommend correlation with  any possible cardiac symptoms. Aortic Atherosclerosis (ICD10-I70.0) and Emphysema (ICD10-J43.9). Electronically Signed   By: Franki Cabot M.D.   On: 01/15/2017 16:27   US Biopsy  Result Date: 01/06/2017 INDICATION: Rectal cancer and liver lesions. EXAM: ULTRASOUND-GUIDED LIVER LESION BIOPSY MEDICATIONS: None. ANESTHESIA/SEDATION: Fentanyl 50 mcg The patient's level of consciousness and vital signs were monitored continuously by radiology nursing throughout the procedure under my direct supervision. FLUOROSCOPY TIME:  None COMPLICATIONS: None immediate. PROCEDURE: Informed written consent was obtained from the patient after a thorough discussion of the procedural risks, benefits and alternatives. All questions were addressed. A timeout was performed prior to the initiation of the procedure. Liver was evaluated with ultrasound. A lesion in the right hepatic lobe was selected for biopsy. Right side of the abdomen was prepped and draped in a sterile fashion. Skin and soft tissues were anesthetized with 1% lidocaine. 17 gauge coaxial needle was directed into right hepatic lesion with ultrasound guidance. Three core biopsies obtained with an 18 gauge core device. Specimens placed in formalin. 17 gauge needle was removed without complication. Bandage placed over the puncture site. FINDINGS: Small isoechoic lesions in the right hepatic lobe. 2 cm lesion was biopsied. No significant bleeding or hematoma formation after the core biopsies. IMPRESSION: Successful ultrasound-guided core biopsies of a right hepatic lesion. Electronically Signed   By: Markus Daft M.D.   On: 01/06/2017 14:05   Dg Chest Port 1 View  Result Date: 01/15/2017 CLINICAL DATA:  Left-sided chest pain. EXAM: PORTABLE CHEST 1 VIEW COMPARISON:  01/01/2017  FINDINGS: The cardiac silhouette is borderline enlarged, unchanged. Aortic atherosclerosis is noted. Advanced chronic lung disease is again seen with interstitial coarsening diffusely as well as bullous changes and asymmetric density in the right upper lobe, overall not significantly changed. No definite superimposed acute airspace consolidation, edema, sizable pleural effusion, or pneumothorax is identified. No acute osseous abnormality is seen. IMPRESSION: Chronic lung disease without definite acute abnormality. Electronically Signed   By: Logan Bores M.D.   On: 01/15/2017 13:26       Subjective: Patient feeling well, no significant dyspnea, no chest pain, no nausea or vomiting, able to ambulate with physical therapy.   Discharge Exam: Vitals:   01/17/17 0553 01/17/17 0839  BP: (!) 115/53   Pulse: 78   Resp: 18   Temp: 98.1 F (36.7 C)   SpO2: 97% 95%   Vitals:   01/16/17 2104 01/16/17 2132 01/17/17 0553 01/17/17 0839  BP:  (!) 137/49 (!) 115/53   Pulse:  (!) 44 78   Resp:  18 18   Temp:  97.8 F (36.6 C) 98.1 F (36.7 C)   TempSrc:  Oral Oral   SpO2: 97% 100% 97% 95%    General: Pt is alert, awake, not in acute distress E ENT: mild pallor, no icterus. Oral mucosa moist.  Cardiovascular: RRR, S1/S2 +, no rubs, no gallops Respiratory: CTA bilaterally, no wheezing, no rhonchi Abdominal: Soft, NT, ND, bowel sounds + Extremities: no edema, no cyanosis    The results of significant diagnostics from this hospitalization (including imaging, microbiology, ancillary and laboratory) are listed below for reference.     Microbiology: Recent Results (from the past 240 hour(s))  Culture, blood (routine x 2) Call MD if unable to obtain prior to antibiotics being given     Status: None (Preliminary result)   Collection Time: 01/15/17  6:44 PM  Result Value Ref Range Status   Specimen Description BLOOD LEFT FOREARM  Final   Special Requests  IN PEDIATRIC BOTTLE Blood Culture  adequate volume  Final   Culture NO GROWTH < 24 HOURS  Final   Report Status PENDING  Incomplete  Culture, blood (routine x 2) Call MD if unable to obtain prior to antibiotics being given     Status: None (Preliminary result)   Collection Time: 01/15/17  6:45 PM  Result Value Ref Range Status   Specimen Description BLOOD LEFT ANTECUBITAL  Final   Special Requests   Final    BOTTLES DRAWN AEROBIC AND ANAEROBIC Blood Culture adequate volume   Culture NO GROWTH < 24 HOURS  Final   Report Status PENDING  Incomplete     Labs: BNP (last 3 results)  Recent Labs  01/01/17 1546  BNP 413.2*   Basic Metabolic Panel:  Recent Labs Lab 01/11/17 1138 01/15/17 1239 01/16/17 0507 01/17/17 0258  NA 136 134* 134* 135  K 4.1 4.1 4.0 3.9  CL  --  103 104 105  CO2 27 23 24 25   GLUCOSE 256* 151* 117* 105*  BUN 20.5 17 13 15   CREATININE 1.1 1.02 1.00 1.07  CALCIUM 9.0 8.6* 8.2* 8.5*   Liver Function Tests:  Recent Labs Lab 01/11/17 1138  AST 24  ALT 23  ALKPHOS 122  BILITOT 0.57  PROT 6.7  ALBUMIN 2.8*   No results for input(s): LIPASE, AMYLASE in the last 168 hours. No results for input(s): AMMONIA in the last 168 hours. CBC:  Recent Labs Lab 01/11/17 1137 01/15/17 1239 01/16/17 0507 01/17/17 0258  WBC 6.3 5.5 6.0 5.8  NEUTROABS 5.2  --   --  4.3  HGB 10.5* 10.7* 9.6* 9.5*  HCT 31.8* 31.1* 29.1* 29.0*  MCV 107.1* 103.3* 103.9* 103.6*  PLT 166 155 157 155   Cardiac Enzymes: No results for input(s): CKTOTAL, CKMB, CKMBINDEX, TROPONINI in the last 168 hours. BNP: Invalid input(s): POCBNP CBG:  Recent Labs Lab 01/16/17 0754 01/16/17 1224 01/16/17 1657 01/16/17 2024 01/17/17 0734  GLUCAP 118* 165* 153* 154* 114*   D-Dimer  Recent Labs  01/15/17 1346  DDIMER 3.55*   Hgb A1c No results for input(s): HGBA1C in the last 72 hours. Lipid Profile No results for input(s): CHOL, HDL, LDLCALC, TRIG, CHOLHDL, LDLDIRECT in the last 72 hours. Thyroid function  studies No results for input(s): TSH, T4TOTAL, T3FREE, THYROIDAB in the last 72 hours.  Invalid input(s): FREET3 Anemia work up No results for input(s): VITAMINB12, FOLATE, FERRITIN, TIBC, IRON, RETICCTPCT in the last 72 hours. Urinalysis    Component Value Date/Time   COLORURINE YELLOW 11/19/2016 0055   APPEARANCEUR CLEAR 11/19/2016 0055   LABSPEC 1.012 11/19/2016 0055   PHURINE 6.0 11/19/2016 0055   GLUCOSEU NEGATIVE 11/19/2016 0055   HGBUR NEGATIVE 11/19/2016 0055   BILIRUBINUR NEGATIVE 11/19/2016 0055   KETONESUR NEGATIVE 11/19/2016 0055   PROTEINUR NEGATIVE 11/19/2016 0055   UROBILINOGEN 0.2 06/07/2014 1924   NITRITE NEGATIVE 11/19/2016 0055   LEUKOCYTESUR NEGATIVE 11/19/2016 0055   Sepsis Labs Invalid input(s): PROCALCITONIN,  WBC,  LACTICIDVEN Microbiology Recent Results (from the past 240 hour(s))  Culture, blood (routine x 2) Call MD if unable to obtain prior to antibiotics being given     Status: None (Preliminary result)   Collection Time: 01/15/17  6:44 PM  Result Value Ref Range Status   Specimen Description BLOOD LEFT FOREARM  Final   Special Requests IN PEDIATRIC BOTTLE Blood Culture adequate volume  Final   Culture NO GROWTH < 24 HOURS  Final   Report Status PENDING  Incomplete  Culture, blood (routine x 2) Call MD if unable to obtain prior to antibiotics being given     Status: None (Preliminary result)   Collection Time: 01/15/17  6:45 PM  Result Value Ref Range Status   Specimen Description BLOOD LEFT ANTECUBITAL  Final   Special Requests   Final    BOTTLES DRAWN AEROBIC AND ANAEROBIC Blood Culture adequate volume   Culture NO GROWTH < 24 HOURS  Final   Report Status PENDING  Incomplete     Time coordinating discharge: 45 minutes  SIGNED:   Tawni Millers, MD  [facility] 01/17/2017, 10:35 AM Pager [phone]  If 7PM-7AM, please contact night-coverage [email or web address] Password [email or web address]

## 2017-01-17 NOTE — Progress Notes (Signed)
SATURATION QUALIFICATIONS: (This note is used to comply with regulatory documentation for home oxygen)  Patient Saturations on Room Air at Rest = 96%  Patient Saturations on Room Air while Ambulating = 93%   Please briefly explain why patient needs home oxygen: Pt does not qualify for home O2,.

## 2017-01-18 ENCOUNTER — Ambulatory Visit: Payer: Medicare Other | Admitting: Cardiovascular Disease

## 2017-01-18 ENCOUNTER — Ambulatory Visit
Admission: RE | Admit: 2017-01-18 | Discharge: 2017-01-18 | Disposition: A | Discharge status: Home / Self Care | Payer: Medicare Other | Source: Ambulatory Visit | Attending: Radiation Oncology | Admitting: Radiation Oncology

## 2017-01-18 DIAGNOSIS — I350 Nonrheumatic aortic (valve) stenosis: Secondary | ICD-10-CM | POA: Diagnosis not present

## 2017-01-18 DIAGNOSIS — Z923 Personal history of irradiation: Secondary | ICD-10-CM | POA: Diagnosis not present

## 2017-01-18 DIAGNOSIS — E1122 Type 2 diabetes mellitus with diabetic chronic kidney disease: Secondary | ICD-10-CM | POA: Diagnosis not present

## 2017-01-18 DIAGNOSIS — Z9842 Cataract extraction status, left eye: Secondary | ICD-10-CM | POA: Diagnosis not present

## 2017-01-18 DIAGNOSIS — Z87442 Personal history of urinary calculi: Secondary | ICD-10-CM | POA: Diagnosis not present

## 2017-01-18 DIAGNOSIS — Z955 Presence of coronary angioplasty implant and graft: Secondary | ICD-10-CM | POA: Diagnosis not present

## 2017-01-18 DIAGNOSIS — F329 Major depressive disorder, single episode, unspecified: Secondary | ICD-10-CM | POA: Diagnosis not present

## 2017-01-18 DIAGNOSIS — I480 Paroxysmal atrial fibrillation: Secondary | ICD-10-CM | POA: Diagnosis not present

## 2017-01-18 DIAGNOSIS — N183 Chronic kidney disease, stage 3 (moderate): Secondary | ICD-10-CM | POA: Diagnosis not present

## 2017-01-18 DIAGNOSIS — Z87891 Personal history of nicotine dependence: Secondary | ICD-10-CM | POA: Diagnosis not present

## 2017-01-18 DIAGNOSIS — Z9841 Cataract extraction status, right eye: Secondary | ICD-10-CM | POA: Diagnosis not present

## 2017-01-18 DIAGNOSIS — K769 Liver disease, unspecified: Secondary | ICD-10-CM | POA: Diagnosis not present

## 2017-01-18 DIAGNOSIS — I5022 Chronic systolic (congestive) heart failure: Secondary | ICD-10-CM | POA: Diagnosis not present

## 2017-01-18 DIAGNOSIS — I2510 Atherosclerotic heart disease of native coronary artery without angina pectoris: Secondary | ICD-10-CM | POA: Diagnosis not present

## 2017-01-18 DIAGNOSIS — Z9221 Personal history of antineoplastic chemotherapy: Secondary | ICD-10-CM | POA: Diagnosis not present

## 2017-01-18 DIAGNOSIS — C20 Malignant neoplasm of rectum: Secondary | ICD-10-CM | POA: Diagnosis not present

## 2017-01-18 DIAGNOSIS — Z803 Family history of malignant neoplasm of breast: Secondary | ICD-10-CM | POA: Diagnosis not present

## 2017-01-18 DIAGNOSIS — Z510 Encounter for antineoplastic radiation therapy: Secondary | ICD-10-CM | POA: Diagnosis present

## 2017-01-18 DIAGNOSIS — E785 Hyperlipidemia, unspecified: Secondary | ICD-10-CM | POA: Diagnosis not present

## 2017-01-18 DIAGNOSIS — Z8249 Family history of ischemic heart disease and other diseases of the circulatory system: Secondary | ICD-10-CM | POA: Diagnosis not present

## 2017-01-18 DIAGNOSIS — Z85038 Personal history of other malignant neoplasm of large intestine: Secondary | ICD-10-CM | POA: Diagnosis not present

## 2017-01-18 DIAGNOSIS — I130 Hypertensive heart and chronic kidney disease with heart failure and stage 1 through stage 4 chronic kidney disease, or unspecified chronic kidney disease: Secondary | ICD-10-CM | POA: Diagnosis not present

## 2017-01-18 DIAGNOSIS — I252 Old myocardial infarction: Secondary | ICD-10-CM | POA: Diagnosis not present

## 2017-01-18 DIAGNOSIS — Z9049 Acquired absence of other specified parts of digestive tract: Secondary | ICD-10-CM | POA: Diagnosis not present

## 2017-01-19 ENCOUNTER — Ambulatory Visit
Admission: RE | Admit: 2017-01-19 | Discharge: 2017-01-19 | Disposition: A | Discharge status: Home / Self Care | Payer: Medicare Other | Source: Ambulatory Visit | Attending: Radiation Oncology | Admitting: Radiation Oncology

## 2017-01-19 DIAGNOSIS — Z510 Encounter for antineoplastic radiation therapy: Secondary | ICD-10-CM | POA: Diagnosis not present

## 2017-01-20 ENCOUNTER — Ambulatory Visit
Admission: RE | Admit: 2017-01-20 | Discharge: 2017-01-20 | Disposition: A | Discharge status: Home / Self Care | Payer: Medicare Other | Source: Ambulatory Visit | Attending: Radiation Oncology | Admitting: Radiation Oncology

## 2017-01-20 DIAGNOSIS — Z510 Encounter for antineoplastic radiation therapy: Secondary | ICD-10-CM | POA: Diagnosis not present

## 2017-01-20 LAB — CULTURE, BLOOD (ROUTINE X 2)
Culture: NO GROWTH
Culture: NO GROWTH
SPECIAL REQUESTS: ADEQUATE
Special Requests: ADEQUATE

## 2017-01-21 ENCOUNTER — Ambulatory Visit: Admission: RE | Admit: 2017-01-21 | Payer: Medicare Other | Source: Ambulatory Visit

## 2017-01-21 ENCOUNTER — Encounter: Payer: Self-pay | Admitting: Radiation Oncology

## 2017-01-24 ENCOUNTER — Other Ambulatory Visit: Payer: Medicare Other

## 2017-01-24 ENCOUNTER — Telehealth: Payer: Self-pay | Admitting: Radiation Oncology

## 2017-01-24 ENCOUNTER — Ambulatory Visit: Admission: RE | Admit: 2017-01-24 | Payer: Medicare Other | Source: Ambulatory Visit

## 2017-01-24 ENCOUNTER — Ambulatory Visit: Payer: Medicare Other | Admitting: Hematology

## 2017-01-24 NOTE — Telephone Encounter (Signed)
Rec'd a message from Suanne Marker to call niece, Serena Colonel about Mr. [name] not wishing to continue tx and wanting to see if the tx he has rec'd has done any good. I have sent an inbasket msg to [place] to look into this.

## 2017-01-25 ENCOUNTER — Ambulatory Visit: Payer: Medicare Other

## 2017-01-25 ENCOUNTER — Telehealth: Payer: Self-pay | Admitting: Radiation Oncology

## 2017-01-25 DIAGNOSIS — Z510 Encounter for antineoplastic radiation therapy: Secondary | ICD-10-CM | POA: Diagnosis not present

## 2017-01-25 NOTE — Telephone Encounter (Addendum)
Addendum:  The patient's niece call back and we discussed his current status with his issues with his heart, syncope, and he is recently spoken with his primary provider about considering comfort care with hospice. He is planning to make a decision regarding this in the next 2 weeks and reviewed this with Dr. Laurann Montana when he needs with him again. In the meantime, the patient's niece says that she is unsure about the patient's current symptoms regarding rectal bleeding and difficulty with pain at the time of defecation. We discussed that cessation of radiotherapy because of it being incompletely could allow for additional progression or recurrence at the site of the rectum, chemotherapies role would be to treat things systemically. We also discussed the options for considering palliative care as he is not quite ready to decide on hospice. His niece will talk with him about our discussion, and get back with me by Thursday. For now we will hold his treatment, and make decisions once they have had a chance to discuss.     I attempted to call the patient's sister Camie Patience. I could not reach her, her phone does not go to a voicemail. I called his niece Jan O'Grady and LM asking her to return my call.

## 2017-01-26 ENCOUNTER — Ambulatory Visit: Payer: Medicare Other

## 2017-01-27 ENCOUNTER — Ambulatory Visit
Admission: RE | Admit: 2017-01-27 | Discharge: 2017-01-27 | Disposition: A | Discharge status: Home / Self Care | Payer: Medicare Other | Source: Ambulatory Visit | Attending: Radiation Oncology | Admitting: Radiation Oncology

## 2017-01-28 ENCOUNTER — Ambulatory Visit: Payer: Medicare Other

## 2017-01-31 ENCOUNTER — Ambulatory Visit: Payer: Medicare Other

## 2017-01-31 ENCOUNTER — Other Ambulatory Visit: Payer: Medicare Other

## 2017-01-31 ENCOUNTER — Telehealth: Payer: Self-pay | Admitting: Radiation Oncology

## 2017-01-31 ENCOUNTER — Other Ambulatory Visit: Payer: Self-pay | Admitting: Radiation Oncology

## 2017-01-31 ENCOUNTER — Ambulatory Visit: Payer: Medicare Other | Admitting: Hematology

## 2017-01-31 NOTE — Telephone Encounter (Signed)
I spoke with the patient's niece Jan O'Grady. The patient has decided to end radiation treatment and is enrolling in palliative care. His family will notify us if there are changes in his status to consider radiotherapy in the future.

## 2017-02-01 ENCOUNTER — Ambulatory Visit: Payer: Medicare Other

## 2017-02-02 ENCOUNTER — Inpatient Hospital Stay (HOSPITAL_COMMUNITY)
Admission: EM | Admit: 2017-02-02 | Discharge: 2017-02-04 | DRG: 281 | Disposition: A | Discharge status: Home / Self Care | Payer: Medicare Other | Attending: Cardiovascular Disease | Admitting: Cardiovascular Disease

## 2017-02-02 ENCOUNTER — Encounter (HOSPITAL_COMMUNITY): Payer: Self-pay | Admitting: Emergency Medicine

## 2017-02-02 ENCOUNTER — Emergency Department (HOSPITAL_COMMUNITY): Payer: Medicare Other

## 2017-02-02 ENCOUNTER — Ambulatory Visit: Payer: Medicare Other

## 2017-02-02 DIAGNOSIS — Z85048 Personal history of other malignant neoplasm of rectum, rectosigmoid junction, and anus: Secondary | ICD-10-CM

## 2017-02-02 DIAGNOSIS — Z888 Allergy status to other drugs, medicaments and biological substances status: Secondary | ICD-10-CM

## 2017-02-02 DIAGNOSIS — Z23 Encounter for immunization: Secondary | ICD-10-CM | POA: Diagnosis present

## 2017-02-02 DIAGNOSIS — Z961 Presence of intraocular lens: Secondary | ICD-10-CM | POA: Diagnosis present

## 2017-02-02 DIAGNOSIS — Z955 Presence of coronary angioplasty implant and graft: Secondary | ICD-10-CM | POA: Diagnosis not present

## 2017-02-02 DIAGNOSIS — Z9842 Cataract extraction status, left eye: Secondary | ICD-10-CM

## 2017-02-02 DIAGNOSIS — E7800 Pure hypercholesterolemia, unspecified: Secondary | ICD-10-CM

## 2017-02-02 DIAGNOSIS — Z803 Family history of malignant neoplasm of breast: Secondary | ICD-10-CM

## 2017-02-02 DIAGNOSIS — I2510 Atherosclerotic heart disease of native coronary artery without angina pectoris: Secondary | ICD-10-CM | POA: Diagnosis present

## 2017-02-02 DIAGNOSIS — Z9049 Acquired absence of other specified parts of digestive tract: Secondary | ICD-10-CM | POA: Diagnosis not present

## 2017-02-02 DIAGNOSIS — I491 Atrial premature depolarization: Secondary | ICD-10-CM | POA: Diagnosis present

## 2017-02-02 DIAGNOSIS — E1122 Type 2 diabetes mellitus with diabetic chronic kidney disease: Secondary | ICD-10-CM | POA: Diagnosis present

## 2017-02-02 DIAGNOSIS — I482 Chronic atrial fibrillation: Secondary | ICD-10-CM | POA: Diagnosis present

## 2017-02-02 DIAGNOSIS — I4510 Unspecified right bundle-branch block: Secondary | ICD-10-CM | POA: Diagnosis present

## 2017-02-02 DIAGNOSIS — I5042 Chronic combined systolic (congestive) and diastolic (congestive) heart failure: Secondary | ICD-10-CM | POA: Diagnosis present

## 2017-02-02 DIAGNOSIS — I480 Paroxysmal atrial fibrillation: Secondary | ICD-10-CM | POA: Diagnosis present

## 2017-02-02 DIAGNOSIS — I493 Ventricular premature depolarization: Secondary | ICD-10-CM | POA: Diagnosis not present

## 2017-02-02 DIAGNOSIS — C787 Secondary malignant neoplasm of liver and intrahepatic bile duct: Secondary | ICD-10-CM | POA: Diagnosis present

## 2017-02-02 DIAGNOSIS — I10 Essential (primary) hypertension: Secondary | ICD-10-CM | POA: Diagnosis not present

## 2017-02-02 DIAGNOSIS — Z7984 Long term (current) use of oral hypoglycemic drugs: Secondary | ICD-10-CM

## 2017-02-02 DIAGNOSIS — I429 Cardiomyopathy, unspecified: Secondary | ICD-10-CM | POA: Diagnosis present

## 2017-02-02 DIAGNOSIS — I350 Nonrheumatic aortic (valve) stenosis: Secondary | ICD-10-CM | POA: Diagnosis present

## 2017-02-02 DIAGNOSIS — I214 Non-ST elevation (NSTEMI) myocardial infarction: Principal | ICD-10-CM | POA: Diagnosis present

## 2017-02-02 DIAGNOSIS — L8990 Pressure ulcer of unspecified site, unspecified stage: Secondary | ICD-10-CM | POA: Insufficient documentation

## 2017-02-02 DIAGNOSIS — Z8249 Family history of ischemic heart disease and other diseases of the circulatory system: Secondary | ICD-10-CM

## 2017-02-02 DIAGNOSIS — Z9841 Cataract extraction status, right eye: Secondary | ICD-10-CM | POA: Diagnosis not present

## 2017-02-02 DIAGNOSIS — I130 Hypertensive heart and chronic kidney disease with heart failure and stage 1 through stage 4 chronic kidney disease, or unspecified chronic kidney disease: Secondary | ICD-10-CM | POA: Diagnosis present

## 2017-02-02 DIAGNOSIS — I252 Old myocardial infarction: Secondary | ICD-10-CM

## 2017-02-02 DIAGNOSIS — I5043 Acute on chronic combined systolic (congestive) and diastolic (congestive) heart failure: Secondary | ICD-10-CM

## 2017-02-02 DIAGNOSIS — N183 Chronic kidney disease, stage 3 (moderate): Secondary | ICD-10-CM | POA: Diagnosis present

## 2017-02-02 DIAGNOSIS — J439 Emphysema, unspecified: Secondary | ICD-10-CM | POA: Diagnosis present

## 2017-02-02 DIAGNOSIS — E785 Hyperlipidemia, unspecified: Secondary | ICD-10-CM | POA: Diagnosis present

## 2017-02-02 DIAGNOSIS — Z7982 Long term (current) use of aspirin: Secondary | ICD-10-CM

## 2017-02-02 DIAGNOSIS — R079 Chest pain, unspecified: Secondary | ICD-10-CM | POA: Diagnosis present

## 2017-02-02 DIAGNOSIS — I4821 Permanent atrial fibrillation: Secondary | ICD-10-CM | POA: Insufficient documentation

## 2017-02-02 DIAGNOSIS — Z87891 Personal history of nicotine dependence: Secondary | ICD-10-CM

## 2017-02-02 LAB — CBC
HCT: 27.7 % — ABNORMAL LOW (ref 39.0–52.0)
HEMOGLOBIN: 9.3 g/dL — AB (ref 13.0–17.0)
MCH: 35.2 pg — AB (ref 26.0–34.0)
MCHC: 33.6 g/dL (ref 30.0–36.0)
MCV: 104.9 fL — ABNORMAL HIGH (ref 78.0–100.0)
Platelets: 146 10*3/uL — ABNORMAL LOW (ref 150–400)
RBC: 2.64 MIL/uL — AB (ref 4.22–5.81)
RDW: 14.1 % (ref 11.5–15.5)
WBC: 5.4 10*3/uL (ref 4.0–10.5)

## 2017-02-02 LAB — I-STAT TROPONIN, ED: TROPONIN I, POC: 0.56 ng/mL — AB (ref 0.00–0.08)

## 2017-02-02 LAB — BASIC METABOLIC PANEL
Anion gap: 6 (ref 5–15)
BUN: 14 mg/dL (ref 6–20)
CALCIUM: 8.3 mg/dL — AB (ref 8.9–10.3)
CO2: 24 mmol/L (ref 22–32)
CREATININE: 1.19 mg/dL (ref 0.61–1.24)
Chloride: 106 mmol/L (ref 101–111)
GFR calc Af Amer: 60 mL/min — ABNORMAL LOW (ref >60)
GFR, EST NON AFRICAN AMERICAN: 52 mL/min — AB (ref >60)
Glucose, Bld: 161 mg/dL — ABNORMAL HIGH (ref 65–99)
Potassium: 4.1 mmol/L (ref 3.5–5.1)
Sodium: 136 mmol/L (ref 135–145)

## 2017-02-02 LAB — TROPONIN I: TROPONIN I: 0.59 ng/mL — AB (ref <0.03)

## 2017-02-02 MED ORDER — INSULIN ASPART 100 UNIT/ML ~~LOC~~ SOLN: 0.0000 [IU] | Freq: Every day | SUBCUTANEOUS | Status: DC

## 2017-02-02 MED ORDER — RANOLAZINE ER 500 MG PO TB12
1000.0000 mg | ORAL_TABLET | Freq: Two times a day (BID) | ORAL | Status: DC
  Administered 2017-02-02 – 2017-02-04 (×4): 1000 mg via ORAL
  Filled 2017-02-02 (×4): qty 2

## 2017-02-02 MED ORDER — ENSURE ENLIVE PO LIQD
237.0000 mL | Freq: Two times a day (BID) | ORAL | Status: DC
  Administered 2017-02-03: 237 mL via ORAL

## 2017-02-02 MED ORDER — PROPYLENE GLYCOL 0.6 % OP SOLN: 1.0000 [drp] | Freq: Three times a day (TID) | OPHTHALMIC | Status: DC | PRN

## 2017-02-02 MED ORDER — DONEPEZIL HCL 10 MG PO TABS
10.0000 mg | ORAL_TABLET | Freq: Every day | ORAL | Status: DC
  Administered 2017-02-02 – 2017-02-03 (×2): 10 mg via ORAL
  Filled 2017-02-02 (×2): qty 1

## 2017-02-02 MED ORDER — AMLODIPINE BESYLATE 5 MG PO TABS: 2.5000 mg | ORAL_TABLET | Freq: Every day | ORAL | Status: DC

## 2017-02-02 MED ORDER — POLYVINYL ALCOHOL 1.4 % OP SOLN
1.0000 [drp] | Freq: Three times a day (TID) | OPHTHALMIC | Status: DC | PRN
  Administered 2017-02-04: 2 [drp] via OPHTHALMIC
  Filled 2017-02-02: qty 15

## 2017-02-02 MED ORDER — ACETAMINOPHEN 325 MG PO TABS: 650.0000 mg | ORAL_TABLET | ORAL | Status: DC | PRN

## 2017-02-02 MED ORDER — CARVEDILOL 3.125 MG PO TABS
3.1250 mg | ORAL_TABLET | Freq: Two times a day (BID) | ORAL | Status: DC
  Administered 2017-02-02 – 2017-02-04 (×4): 3.125 mg via ORAL
  Filled 2017-02-02 (×4): qty 1

## 2017-02-02 MED ORDER — HEPARIN BOLUS VIA INFUSION
4000.0000 [IU] | Freq: Once | INTRAVENOUS | Status: AC
  Administered 2017-02-02: 4000 [IU] via INTRAVENOUS
  Filled 2017-02-02: qty 4000

## 2017-02-02 MED ORDER — ONDANSETRON HCL 4 MG/2ML IJ SOLN: 4.0000 mg | Freq: Four times a day (QID) | INTRAMUSCULAR | Status: DC | PRN

## 2017-02-02 MED ORDER — LOSARTAN POTASSIUM 25 MG PO TABS
12.5000 mg | ORAL_TABLET | Freq: Every day | ORAL | Status: DC
  Administered 2017-02-03 – 2017-02-04 (×2): 12.5 mg via ORAL
  Filled 2017-02-02 (×2): qty 1

## 2017-02-02 MED ORDER — ACETAMINOPHEN 500 MG PO TABS
1000.0000 mg | ORAL_TABLET | Freq: Four times a day (QID) | ORAL | Status: DC | PRN
Start: 2017-02-02 — End: 2017-02-02

## 2017-02-02 MED ORDER — ASPIRIN EC 81 MG PO TBEC
81.0000 mg | DELAYED_RELEASE_TABLET | Freq: Every day | ORAL | Status: DC
  Administered 2017-02-03 – 2017-02-04 (×2): 81 mg via ORAL
  Filled 2017-02-02 (×2): qty 1

## 2017-02-02 MED ORDER — INFLUENZA VAC SPLIT HIGH-DOSE 0.5 ML IM SUSY
0.5000 mL | PREFILLED_SYRINGE | INTRAMUSCULAR | Status: AC
  Administered 2017-02-04: 0.5 mL via INTRAMUSCULAR
  Filled 2017-02-02 (×2): qty 0.5

## 2017-02-02 MED ORDER — NITROGLYCERIN 0.4 MG SL SUBL: 0.4000 mg | SUBLINGUAL_TABLET | SUBLINGUAL | Status: DC | PRN

## 2017-02-02 MED ORDER — IPRATROPIUM-ALBUTEROL 0.5-2.5 (3) MG/3ML IN SOLN: 3.0000 mL | Freq: Four times a day (QID) | RESPIRATORY_TRACT | Status: DC | PRN

## 2017-02-02 MED ORDER — INSULIN ASPART 100 UNIT/ML ~~LOC~~ SOLN
0.0000 [IU] | Freq: Three times a day (TID) | SUBCUTANEOUS | Status: DC
Start: 2017-02-03 — End: 2017-02-04
  Administered 2017-02-03: 2 [IU] via SUBCUTANEOUS
  Administered 2017-02-04: 3 [IU] via SUBCUTANEOUS

## 2017-02-02 MED ORDER — ISOSORBIDE MONONITRATE ER 30 MG PO TB24
30.0000 mg | ORAL_TABLET | Freq: Every day | ORAL | Status: DC
  Administered 2017-02-02: 30 mg via ORAL
  Filled 2017-02-02 (×2): qty 1

## 2017-02-02 MED ORDER — HEPARIN (PORCINE) IN NACL 100-0.45 UNIT/ML-% IJ SOLN
1000.0000 [IU]/h | INTRAMUSCULAR | Status: DC
  Administered 2017-02-02: 800 [IU]/h via INTRAVENOUS
  Administered 2017-02-03: 1000 [IU]/h via INTRAVENOUS
  Filled 2017-02-02 (×2): qty 250

## 2017-02-02 NOTE — ED Notes (Signed)
EDP at bedside  

## 2017-02-02 NOTE — H&P (Signed)
Patient ID: [name] MRN: [id number] DOB/AGE: [birth year] 83 y.o.  Admit date: 02/02/2017 Primary Physician   Lavone Orn, MD  Primary Cardiologist   Dr. [name] Chief Complaint    Chest pain   HPI: [name] is a 83M with CAD status post PCI in 2012, moderate aortic stenosis, chronic systolic and diastolic heart failure, COPD, hypertension, hyperlipidemia, Barrett's esophagus, CKD III, and recurrent metastatic colorectal cancer here with chest pain and positive troponin.  [name] was feeling well until he developed sudden onset left-sided chest pain. It was severe in intensity and did not radiate. He describes it as a pressure that was associated with shortness of breath and nausea but no diaphoresis. The symptoms started suddenly while sitting down. It was worse with exertion. He has not noted any lower extremity edema, orthopnea, or PND.  He reports that is breathing has returned to baseline and he is currently chest pain free.   [name] with admitted earlier this month for syncope and pneumonia that was successfully treated with levofloxacin. Echo that admission revealed LVEF 35-40% with moderate aortic stenosis.  Troponin was mildly elevated to 0.03.  At that time he was not felt to be a candidate for aggressive interventions due to his age and metastatic disease.  [name] had a nuclear stress test 08/2016 that showed infarct but no ischemia. LVEF was 48%.  He had a LHC 05/2016 that revealed 80% D2 and 50% ostial RCA disease.  His LAD stent was patent. At that time he was thought to have chronic, stable angina and medical management was recommended.  [name] last saw his oncologist, Dr. Burr Medico, 01/11/17.  At that time his CT scan showed liver metastases.  He was started on palliative radiation. Liver biopsy confirmed metastatic disease.  Review of Systems:  A 12 point review of systems was obtained and was negative with exceptions noted in the HPI.   Past  Medical History:  Diagnosis Date  . Aortic stenosis, moderate cardiolgosit-  dr Cathie Olden   per last echo 10-15-2016 aortic stenosis somewhat worse since last echo 11-14-2015 moderate calcification and thickened leaflets, valve area 1.01cm^2,  mead gradiant 43mmHg, peak grandiant 31mmHgf  . Barrett esophagus dx 2015  . Chronic stable angina ([place])   . Chronic systolic CHF (congestive heart failure) ([place]) followed by dr Cathie Olden   a. EF previously 35-40%. 03/ 2013 b. improved to 55-65% by cath 08/2013./  per echo 06/ 2018 ef 50-55%  . CKD (chronic kidney disease), stage III   . Constipation   . Coronary artery disease cardiologist-- dr Cathie Olden   a. s/p PCI w/ DES to mLAD 08/05/10. b. NSTEMI 08/2013 (mildly elev trop) - cath showing widely patent stent, 80% prox D1 (small vessel) with recommendation for medical management, residual 60% ostial PDA, <20% LCx, LVEF 55-65%.   . Depression   . History of colon cancer, stage III oncologist- dr Waymon Budge--  per last note no recurrence 2013   dx 10/ 1992,  Stage III-- treament post right colecotmy, chemoradiation/  1993 localized recurrent mesenteric mass -- treatment post resection mass and chemoradiation  . History of kidney stones   . History of non-ST elevation myocardial infarction (NSTEMI)    08-14-2013  . History of small bowel obstruction    03/ 2005 partial SBO due to adhesions  post lysis adhesions and small bowel resection  . Hyperlipidemia   . Hypertension   . Macrocytic anemia dx 1992   chronic  . PAF (paroxysmal  atrial fibrillation) [facility]) cardiologist-  dr Cathie Olden   a. failed TEE due to inability to pass probe into the esophagus in March 2013. b. On Coumadin. Was in NSR 08/2013 admission.  . Premature atrial contractions   . Premature ventricular contractions   . RBBB (right bundle branch block)   . Rectal mass   . S/P drug eluting coronary stent placement 08/05/2010   DES x1  to midLAD  . Type II diabetes mellitus ([place])   . Wears glasses       (Not in a hospital admission)   Allergies  Allergen Reactions  . Metformin And Related Diarrhea and Nausea And Vomiting    Social History   Social History  . Marital status: Single    Spouse name: N/A  . Number of children: 0  . Years of education: N/A   Occupational History  . Retired    Social History Main Topics  . Smoking status: Former Smoker    Packs/day: 1.00    Years: 39.00    Types: Cigarettes    Quit date: 05/10/1992  . Smokeless tobacco: Never Used  . Alcohol use 1.2 oz/week    2 Shots of liquor per week     Comment: occasionally.  . Drug use: No  . Sexual activity: No   Other Topics Concern  . Not on file   Social History Narrative   Lives alone.      Family History  Problem Relation Age of Onset  . Cancer Mother        breast cancer   . Ulcers Father 54  . Heart attack Brother 70    PHYSICAL EXAM: Vitals:   02/02/17 1745 02/02/17 1800  BP: 129/66 (!) 144/76  Pulse: 82 82  Resp: 19 18  Temp:    SpO2: 99% 100%   GENERAL:  Well appearing elderly man in no acute distress. HEENT: Pupils equal round and reactive, fundi not visualized, oral mucosa unremarkable NECK:  No jugular venous distention, waveform within normal limits, carotid upstroke brisk and symmetric, no bruits LUNGS:  Clear to auscultation bilaterally HEART:  Irregularly irregular.  PMI not displaced or sustained,S1 and S2 within normal limits, no S3, no S4, no clicks, no rubs, II/VI systolic murmur at the LUSB. ABD:  Flat, positive bowel sounds normal in frequency in pitch, no bruits, no rebound, no guarding, no midline pulsatile mass, no hepatomegaly, no splenomegaly EXT:  2 plus pulses throughout, no edema, no cyanosis no clubbing SKIN:  No rashes no nodules NEURO:  Cranial nerves II through XII grossly intact, motor grossly intact throughout [facility]:  Cognitively intact, oriented to person place and time    Results for orders placed or performed during the hospital  encounter of 02/02/17 (from the past 24 hour(s))  Basic metabolic panel     Status: Abnormal   Collection Time: 02/02/17  2:18 PM  Result Value Ref Range   Sodium 136 135 - 145 mmol/L   Potassium 4.1 3.5 - 5.1 mmol/L   Chloride 106 101 - 111 mmol/L   CO2 24 22 - 32 mmol/L   Glucose, Bld 161 (H) 65 - 99 mg/dL   BUN 14 6 - 20 mg/dL   Creatinine, Ser 1.19 0.61 - 1.24 mg/dL   Calcium 8.3 (L) 8.9 - 10.3 mg/dL   GFR calc non Af Amer 52 (L) >60 mL/min   GFR calc Af Amer 60 (L) >60 mL/min   Anion gap 6 5 - 15  CBC  Status: Abnormal   Collection Time: 02/02/17  2:18 PM  Result Value Ref Range   WBC 5.4 4.0 - 10.5 K/uL   RBC 2.64 (L) 4.22 - 5.81 MIL/uL   Hemoglobin 9.3 (L) 13.0 - 17.0 g/dL   HCT 27.7 (L) 39.0 - 52.0 %   MCV 104.9 (H) 78.0 - 100.0 fL   MCH 35.2 (H) 26.0 - 34.0 pg   MCHC 33.6 30.0 - 36.0 g/dL   RDW 14.1 11.5 - 15.5 %   Platelets 146 (L) 150 - 400 K/uL  I-stat troponin, ED     Status: Abnormal   Collection Time: 02/02/17  2:48 PM  Result Value Ref Range   Troponin i, poc 0.56 (HH) 0.00 - 0.08 ng/mL   Comment NOTIFIED PHYSICIAN    Comment 3           Dg Chest 2 View  Result Date: 02/02/2017 CLINICAL DATA:  Chest pain EXAM: CHEST  2 VIEW COMPARISON:  01/15/2017, CT chest 01/15/2017 FINDINGS: Hyperinflation. Coarse chronic interstitial opacity. Partial clearing of right upper lobe opacity. Cystic lucencies in the right upper lobe, corresponding to emphysematous changes and bronchiectasis on the prior CT. No new consolidation or effusion. Stable cardiomediastinal silhouette with atherosclerosis. No pneumothorax. Degenerative changes of the spine. Surgical clips in the right upper quadrant. IMPRESSION: 1. Slight improved aeration of right upper lobe with residual cystic changes corresponding to emphysematous changes and bronchiectasis noted on prior CT 2. No acute interval change since prior radiograph. Electronically Signed   By: Donavan Foil M.D.   On: 02/02/2017 15:03     Echo 8/ Study Conclusions  - Left ventricle: The cavity size was normal. Systolic function was   moderately reduced. The estimated ejection fraction was in the   range of 35% to 40%. Wall motion was normal; there were no   regional wall motion abnormalities. Doppler parameters are   consistent with abnormal left ventricular relaxation (grade 1   diastolic dysfunction). Doppler parameters are consistent with   elevated ventricular end-diastolic filling pressure. - Aortic valve: Trileaflet; mildly thickened, mildly calcified   leaflets. Mean gradient (S): 21 mm Hg. Peak gradient (S): 38 mm   Hg. Valve area (VTI): 0.75 cm^2. Valve area (Vmax): 0.72 cm^2.   Valve area (Vmean): 0.72 cm^2. - Aortic root: The aortic root was normal in size. - Mitral valve: There was trivial regurgitation. - Left atrium: The atrium was normal in size. - Right ventricle: Systolic function was normal. - Tricuspid valve: There was trivial regurgitation. - Pulmonic valve: There was no regurgitation. - Pulmonary arteries: Systolic pressure was within the normal   range. - Inferior vena cava: The vessel was normal in size. - Pericardium, extracardiac: There was no pericardial effusion.  Impressions:  - Since the prior study on 10/15/2016 LVEF has decreased from 50-55%   to 35-40%, aortic stenosis is moderate by gradients, severe by   AVA.   ECG: Atrial fibrillation. Rate 63 bpm. Frequent PVCs. Right bundle branch block.  Prior anterolateral infarct.     ASSESSMENT/PLAN:  # NSTEMI: [name] presents with elevated troponin 0.53, which is higher than his baseline around 0.03. He also had left-sided chest pain and EKG shows new anterolateral Q waves. Findings are consistent with infarct. He is medically complicated given that he also has metastatic colorectal cancer and is 83 years old. Overall, he is in fairly good health and still lives alone. We will continue to cycle his cardiac enzymes to assess  the trend. He  has a known 80% D2 lesion.  Continue aspirin and start heparin.  Continue home carvedilol, Imdur and Ranexa.  NPO after midnight in case he needs cath.  There doesn't seem to be much utility in stressing him.  Either cath if there is a significant increase in the enzymes or continue medical management.    # Chronic systolic and diastolic heart failure: LVEF 35-50%.  No need to repeat his echo that was performed last month. Continue carvedilol.  Will stop amlodipine and start losartan.    # Hypertension: BP controlled on carvedilol.   # DM: Hold home oral agents and start SSI.   # Metastatic colorectal cancer: Per oncology.  Receiving palliative XRT.  Signed: Glynnis Gavel C. Oval Linsey, MD, [facility]  02/02/2017, 6:26 PM

## 2017-02-02 NOTE — ED Notes (Addendum)
Patient transported to x-ray. ?

## 2017-02-02 NOTE — Progress Notes (Signed)
ANTICOAGULATION CONSULT NOTE - Initial Consult  Pharmacy Consult for heparin Indication: chest pain/ACS  Allergies  Allergen Reactions  . Metformin And Related Diarrhea and Nausea And Vomiting    Patient Measurements: Height: 5\' 9"  (175.3 cm) Weight: 137 lb (62.1 kg) IBW/kg (Calculated) : 70.7 Heparin Dosing Weight: 62.1 kg  Vital Signs: Temp: 97.8 F (36.6 C) (09/26 1414) Temp Source: Oral (09/26 1414) BP: 140/76 (09/26 1948) Pulse Rate: 95 (09/26 1948)  Labs:  Recent Labs  02/02/17 1418 02/02/17 1837  HGB 9.3*  --   HCT 27.7*  --   PLT 146*  --   CREATININE 1.19  --   TROPONINI  --  0.59*    Estimated Creatinine Clearance: 35.5 mL/min (by C-G formula based on SCr of 1.19 mg/dL).   Medical History: Past Medical History:  Diagnosis Date  . Aortic stenosis, moderate cardiolgosit-  dr Cathie Olden   per last echo 10-15-2016 aortic stenosis somewhat worse since last echo 11-14-2015 moderate calcification and thickened leaflets, valve area 1.01cm^2,  mead gradiant 75mmHg, peak grandiant 30mmHgf  . Barrett esophagus dx 2015  . Chronic stable angina ([place])   . Chronic systolic CHF (congestive heart failure) ([place]) followed by dr Cathie Olden   a. EF previously 35-40%. 03/ 2013 b. improved to 55-65% by cath 08/2013./  per echo 06/ 2018 ef 50-55%  . CKD (chronic kidney disease), stage III   . Constipation   . Coronary artery disease cardiologist-- dr Cathie Olden   a. s/p PCI w/ DES to mLAD 08/05/10. b. NSTEMI 08/2013 (mildly elev trop) - cath showing widely patent stent, 80% prox D1 (small vessel) with recommendation for medical management, residual 60% ostial PDA, <20% LCx, LVEF 55-65%.   . Depression   . History of colon cancer, stage III oncologist- dr Waymon Budge--  per last note no recurrence 2013   dx 10/ 1992,  Stage III-- treament post right colecotmy, chemoradiation/  1993 localized recurrent mesenteric mass -- treatment post resection mass and chemoradiation  . History of kidney  stones   . History of non-ST elevation myocardial infarction (NSTEMI)    08-14-2013  . History of small bowel obstruction    03/ 2005 partial SBO due to adhesions  post lysis adhesions and small bowel resection  . Hyperlipidemia   . Hypertension   . Macrocytic anemia dx 1992   chronic  . PAF (paroxysmal atrial fibrillation) [facility]) cardiologist-  dr Cathie Olden   a. failed TEE due to inability to pass probe into the esophagus in March 2013. b. On Coumadin. Was in NSR 08/2013 admission.  . Premature atrial contractions   . Premature ventricular contractions   . RBBB (right bundle branch block)   . Rectal mass   . S/P drug eluting coronary stent placement 08/05/2010   DES x1  to midLAD  . Type II diabetes mellitus ([place])   . Wears glasses     Assessment: 83 yo male with L-sided chest pain, found to have elevated troponin. From notes, it appears the patient was undergoing chemotherapy but recently decided to stop treatments. hgb 9.3, plts ok. Planning to start heparin while undergoing cards eval.   Goal of Therapy:  Heparin level 0.3-0.7 units/ml Monitor platelets by anticoagulation protocol: Yes    Plan:  -Heparin bolus 4000 units x1 then 800 units/hr -Daily HL, CBC -First level with AM labs   Harvel Quale 02/02/2017,8:05 PM

## 2017-02-02 NOTE — Progress Notes (Signed)
  Radiation Oncology         [phone] ________________________________  Name: [name] MRN: [id number]  Date: 01/21/2017  DOB: [birth year]  End of Treatment Note  Diagnosis:   At least Stage IIA, T3N0Mx adenocarcinoma of the rectum.       ICD-10-CM   1. Rectal cancer ([name]) C20     Indication for treatment:  palliative       Radiation treatment dates:   01/04/2017 to 01/20/2017  Site/dose:    1. The rectum was treated to 21.6 Gy in 12 fractions at 1.8 Gy per fraction, out of a planned dose of 45 Gy total with additional rectum boost.  Beams/energy:   3D // 15X, 6X  Narrative: The patient voluntarily decided to discontinue radiotherapy due to poor performance status. Instead he is enrolling in palliative care.  Plan: The patient voluntarily decided to discontinue radiation treatment and is enrolling in palliative care. His family will notify us if there are changes in his status to consider radiotherapy in the future. I advised them to call if they have any questions or concerns related to their recovery or treatment.  ------------------------------------------------  Jodelle Gross, MD, PhD  This document serves as a record of services personally performed by Kyung Rudd, MD. It was created on his behalf by Arlyce Harman, a trained medical scribe. The creation of this record is based on the scribe's personal observations and the provider's statements to them. This document has been checked and approved by the attending provider.

## 2017-02-02 NOTE — ED Triage Notes (Signed)
Pt arrived to from home via EMS. Complaints of new onset of left sided chest pain Family reports he was breathing when episode started. Took 2 Nitro at home. Hypotensive at EMS arrival. Per EMS report initial BP was 60 systolic. Pt recently;y stopped chemotherapy. Weak in appearance.

## 2017-02-02 NOTE — ED Provider Notes (Signed)
[facility] DEPT Provider Note   CSN: [id number] Arrival date & time: 02/02/17  1407     History   Chief Complaint Chief Complaint  Patient presents with  . Chest Pain    HPI [name] is a 83 y.o. male.  HPI   83 year old male presents today with complaints of chest pain and fatigue.  Family is at bedside who provides part of his history.  Patient notes this morning he had acute onset of left-sided chest pain.  He is unable to describe this pain after multiple attempts.  Family notes that he was holding his chest, and appeared as if he was going to pass out.  They note his eyes rolled upward and patient sat back with a blank stare on his face.  She took 2 nitroglycerin at home.  EMS was called and reported a blood pressure that was initially 60 systolic.   Family notes that patient is usually ambulatory with a cane, notes he has been too weak to ambulate today.  Patient denies any fever, productive cough, shortness of breath, abdominal pain, lower extremity swelling or edema, or changes in urine or stool. Pt's sister who is at bedside is the power of attorney.     Past Medical History:  Diagnosis Date  . Aortic stenosis, moderate cardiolgosit-  dr Cathie Olden   per last echo 10-15-2016 aortic stenosis somewhat worse since last echo 11-14-2015 moderate calcification and thickened leaflets, valve area 1.01cm^2,  mead gradiant 73mmHg, peak grandiant 56mmHgf  . Barrett esophagus dx 2015  . Chronic stable angina ([place])   . Chronic systolic CHF (congestive heart failure) ([place]) followed by dr Cathie Olden   a. EF previously 35-40%. 03/ 2013 b. improved to 55-65% by cath 08/2013./  per echo 06/ 2018 ef 50-55%  . CKD (chronic kidney disease), stage III   . Constipation   . Coronary artery disease cardiologist-- dr Cathie Olden   a. s/p PCI w/ DES to mLAD 08/05/10. b. NSTEMI 08/2013 (mildly elev trop) - cath showing widely patent stent, 80% prox D1 (small vessel) with recommendation for medical  management, residual 60% ostial PDA, <20% LCx, LVEF 55-65%.   . Depression   . History of colon cancer, stage III oncologist- dr Waymon Budge--  per last note no recurrence 2013   dx 10/ 1992,  Stage III-- treament post right colecotmy, chemoradiation/  1993 localized recurrent mesenteric mass -- treatment post resection mass and chemoradiation  . History of kidney stones   . History of non-ST elevation myocardial infarction (NSTEMI)    08-14-2013  . History of small bowel obstruction    03/ 2005 partial SBO due to adhesions  post lysis adhesions and small bowel resection  . Hyperlipidemia   . Hypertension   . Macrocytic anemia dx 1992   chronic  . PAF (paroxysmal atrial fibrillation) [facility]) cardiologist-  dr Cathie Olden   a. failed TEE due to inability to pass probe into the esophagus in March 2013. b. On Coumadin. Was in NSR 08/2013 admission.  . Premature atrial contractions   . Premature ventricular contractions   . RBBB (right bundle branch block)   . Rectal mass   . S/P drug eluting coronary stent placement 08/05/2010   DES x1  to midLAD  . Type II diabetes mellitus ([place])   . Wears glasses     Patient Active Problem List   Diagnosis Date Noted  . NSTEMI (non-ST elevated myocardial infarction) ([place]) 02/02/2017  . Permanent atrial fibrillation ([place])   . S/P coronary  artery stent placement   . Emphysema lung ([place]) 01/17/2017  . Pneumonia 01/16/2017  . CAP (community acquired pneumonia) 01/15/2017  . Protein-calorie malnutrition, severe 01/04/2017  . Syncope and collapse 01/01/2017  . Bradycardia 01/01/2017  . Iron deficiency anemia due to chronic blood loss 12/28/2016  . Rectal cancer ([place]) 12/27/2016  . Aortic stenosis 10/15/2016  . Barrett esophagus 07/17/2016  . Encounter for therapeutic drug monitoring 05/24/2016  . Malnutrition of moderate degree 05/20/2016  . Hypertensive heart disease without heart failure   . Chronic diastolic heart failure ([place])   . Chest discomfort  11/03/2015  . Transient Hypotension 11/03/2015  . Generalized weakness 11/03/2015  . Elevated INR 11/03/2015  . Anemia, unspecified 11/03/2015  . Chronic anticoagulation 11/03/2015  . Thrombocytopenia ([place]) 08/10/2015  . Non-insulin dependent type 2 diabetes mellitus ([place])   . History of colon cancer, stage III 09/11/2014  . BRBPR (bright red blood per rectum) 09/11/2014  . Other pancytopenia ([place]) 06/18/2014  . Heart failure with reduced ejection fraction ([employer]) 06/07/2014  . History of non-ST elevation myocardial infarction (NSTEMI) 08/15/2013  . 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 Acute on chronic systolic and diastolic heart failure, NYHA class 2 ([place]) 07/21/2011  . Paroxysmal atrial fibrillation ([place]) 07/20/2011  . Cardiomyopathy ([place]) 07/20/2011  . Macrocytic anemia 06/25/2011  . Bruising 09/18/2010  . Coronary artery disease involving native heart without angina pectoris 08/13/2010  . Hypertension   . Hyperlipidemia     Past Surgical History:  Procedure Laterality Date  . ABDOMINAL MASS RESECTION  11/1991   localized recurrent colon cancer mesenteric lymph node mass (area of distal aorta and vena cava) and debulking  . CARDIAC CATHETERIZATION  08/03/10   dr Lia Foyer   preserved LVSF, mod. high grade stenosis LAD, diagonal stenosis at the bend 60-70%  . CARDIAC CATHETERIZATION  08-09-2016  dr Martinique   ostialLAD 30%, wide patent mLAD stent, pCFX 40%,  mild irregularities throughout RCA < 20%  . CARDIAC CATHETERIZATION N/A 05/20/2016   Procedure: Left Heart Cath and Coronary Angiography;  Surgeon: Lorretta Harp, MD;  Location: [facility] CV LAB;  Service: Cardiovascular;  Laterality: N/A; normal LVF,  D2 80%.  ostRCA 50%,  LAD stent patent  . CARDIOVASCULAR STRESS TEST  09-01-2016   dr Cathie Olden   Intermediate risk nuclear study w/ medium non-reversible defect of moderate severity in the basal inferolateral and mid inferolateral location/ no ST  segment deviation noted during stress,  nuclear stress ef 48% (45-54%)  . CATARACT EXTRACTION W/ INTRAOCULAR LENS  IMPLANT, BILATERAL    . CORONARY ANGIOPLASTY WITH STENT PLACEMENT  08/05/10   dr Lia Foyer   PCI and DES x1  LAD  . CYSTO/  URETEROSCOPIC STONE EXTRACTION Left 10/18/2002  . CYSTOSCOPY WITH RETROGRADE PYELOGRAM, URETEROSCOPY AND STENT PLACEMENT Bilateral 05/18/2013   Procedure: CYSTOSCOPY WITH BILATERAL RETROGRADE PYELOGRAM, LEFT URETEROSCOPY AND LEFT STENT PLACEMENT;  Surgeon: Alexis Frock, MD;  Location: [facility] ORS;  Service: Urology;  Laterality: Bilateral;  . EXPLORATORY LAPAROTOMY W/ LYSIS ADHESIONS AND SMALL BOWEL RESECTION  07/09/2003   partial SBO  . FLEXIBLE SIGMOIDOSCOPY N/A 10/07/2016   Procedure: FLEXIBLE SIGMOIDOSCOPY;  Surgeon: Wilford Corner, MD;  Location: [facility] ENDOSCOPY;  Service: Endoscopy;  Laterality: N/A;  . LAPAROSCOPIC CHOLECYSTECTOMY  12-04-2009  dr Margot Chimes  . LEFT HEART CATHETERIZATION WITH CORONARY ANGIOGRAM N/A 08/15/2013   Procedure: LEFT HEART CATHETERIZATION WITH CORONARY ANGIOGRAM;  Surgeon: Peter M Martinique, MD;  Location: [facility] CATH LAB;  Service: Cardiovascular;  Laterality: N/A; single vessel obstructive CAD involving a small diagonal branch, patent LAD stent , normal LVF  . RIGHT COLECTOMY  03/1991   w/ appendectomy  . TONSILLECTOMY    . TRANSTHORACIC ECHOCARDIOGRAM  10-15-2016  dr Cathie Olden   grade 1 diastolic dysfunction, ef 20-94%/  moderate AV stenosis (valve area 1.012cm^2, mean gradiant 2mmHg, peak grandiant 68mmHg)/ moderate LAE/  trivial PR/  mild RAE       Home Medications    Prior to Admission medications   Medication Sig Start Date End Date Taking? Authorizing Provider  acetaminophen (TYLENOL) 500 MG tablet Take 1,000 mg by mouth every 6 (six) hours as needed (shoulder pain).   Yes [provider]  amLODipine (NORVASC) 2.5 MG tablet Take 1 tablet (2.5 mg total) by mouth daily. 01/05/17  Yes Nahser, Wonda Cheng, MD  aspirin EC 81 MG tablet  Take 1 tablet (81 mg total) by mouth daily. 10/09/16  Yes Bonnielee Haff, MD  carvedilol (COREG) 3.125 MG tablet Take 1 tablet (3.125 mg total) by mouth 2 (two) times daily with a meal. 01/04/17  Yes Nita Sells, MD  donepezil (ARICEPT) 10 MG tablet Take 10 mg by mouth at bedtime.  05/04/16  Yes [provider]  glimepiride (AMARYL) 1 MG tablet Take 1 mg by mouth daily with breakfast.  05/29/16  Yes [provider]  isosorbide mononitrate (IMDUR) 30 MG 24 hr tablet Take 1 tablet (30 mg total) by mouth daily. 01/05/17  Yes Nita Sells, MD  linagliptin (TRADJENTA) 5 MG TABS tablet Take 1 tablet (5 mg total) by mouth daily. For blood sugar Patient taking differently: Take 5 mg by mouth daily. For blood sugar 11/04/15  Yes Johnson, Clanford L, MD  nitroGLYCERIN (NITROSTAT) 0.4 MG SL tablet PLACE ONE TABLET UNDER THE TONGUE EVERY 5 MINUTES AS NEEDED FOR CHEST PAIN. Patient taking differently: PLACE 0.4 MG UNDER THE TONGUE EVERY 5 MINUTES AS NEEDED FOR CHEST PAIN. 08/16/16  Yes Nahser, Wonda Cheng, MD  Propylene Glycol (SYSTANE BALANCE) 0.6 % SOLN Place 1-2 drops into both eyes 3 (three) times daily as needed (for dry eyes).    Yes [provider]  ranolazine (RANEXA) 1000 MG SR tablet Take 1 tablet (1,000 mg total) by mouth 2 (two) times daily. 01/04/17  Yes Nita Sells, MD  ipratropium-albuterol (DUONEB) 0.5-2.5 (3) MG/3ML SOLN Take 3 mLs by nebulization every 6 (six) hours as needed (shortness of breath or wheezing). Patient not taking: Reported on 02/02/2017 01/17/17 02/16/17  Arrien, Jimmy Picket, MD  levofloxacin (LEVAQUIN) 500 MG tablet Take 1 tablet (500 mg total) by mouth daily. Patient not taking: Reported on 02/02/2017 01/17/17   Arrien, Jimmy Picket, MD    Family History Family History  Problem Relation Age of Onset  . Cancer Mother        breast cancer   . Ulcers Father 69  . Heart attack Brother 81    Social History Social History    Substance Use Topics  . Smoking status: Former Smoker    Packs/day: 1.00    Years: 39.00    Types: Cigarettes    Quit date: 05/10/1992  . Smokeless tobacco: Never Used  . Alcohol use 1.2 oz/week    2 Shots of liquor per week     Comment: occasionally.     Allergies   Metformin and related   Review of Systems Review of Systems  All other systems reviewed and are negative.    Physical Exam Updated Vital Signs BP 136/63 (  BP Location: Right Arm)   Pulse (!) 43   Temp 97.7 F (36.5 C) (Oral)   Resp 20   Ht 5\' 9"  (1.753 m)   Wt 62.1 kg (137 lb)   SpO2 97%   BMI 20.23 kg/m   Physical Exam  Constitutional: He is oriented to person, place, and time. He appears well-developed and well-nourished.  HENT:  Head: Normocephalic and atraumatic.  Eyes: Pupils are equal, round, and reactive to light. Conjunctivae are normal. Right eye exhibits no discharge. Left eye exhibits no discharge. No scleral icterus.  Neck: Normal range of motion. No JVD present. No tracheal deviation present.  Cardiovascular:  Irregularly irregular rhythm   Pulmonary/Chest: Effort normal and breath sounds normal. No stridor. No respiratory distress. He has no wheezes. He has no rales. He exhibits no tenderness.  Neurological: He is alert and oriented to person, place, and time. Coordination normal.  Psychiatric: He has a normal mood and affect. His behavior is normal. Judgment and thought content normal.  Nursing note and vitals reviewed.    ED Treatments / Results  Labs (all labs ordered are listed, but only abnormal results are displayed) Labs Reviewed  BASIC METABOLIC PANEL - Abnormal; Notable for the following:       Result Value   Glucose, Bld 161 (*)    Calcium 8.3 (*)    GFR calc non Af Amer 52 (*)    GFR calc Af Amer 60 (*)    All other components within normal limits  CBC - Abnormal; Notable for the following:    RBC 2.64 (*)    Hemoglobin 9.3 (*)    HCT 27.7 (*)    MCV 104.9 (*)     MCH 35.2 (*)    Platelets 146 (*)    All other components within normal limits  TROPONIN I - Abnormal; Notable for the following:    Troponin I 0.59 (*)    All other components within normal limits  I-STAT TROPONIN, ED - Abnormal; Notable for the following:    Troponin i, poc 0.56 (*)    All other components within normal limits  URINALYSIS, ROUTINE W REFLEX MICROSCOPIC  TROPONIN I  TROPONIN I  CBC  BASIC METABOLIC PANEL  HEPARIN LEVEL (UNFRACTIONATED)    EKG  EKG Interpretation  Date/Time:  Wednesday February 02 2017 14:13:33 EDT Ventricular Rate:  63 PR Interval:    QRS Duration: 145 QT Interval:  467 QTC Calculation: 479 R Axis:   -16 Text Interpretation:  Atrial fibrillation Ventricular bigeminy Right bundle branch block Anterolateral infarct, age indeterminate Multiple PVC forms.  When compared to prior, QRS complexes are different diffusely but no STEMI Confirmed by Antony Blackbird [phone]) on 02/02/2017 2:39:23 PM      Radiology Dg Chest 2 View  Result Date: 02/02/2017 CLINICAL DATA:  Chest pain EXAM: CHEST  2 VIEW COMPARISON:  01/15/2017, CT chest 01/15/2017 FINDINGS: Hyperinflation. Coarse chronic interstitial opacity. Partial clearing of right upper lobe opacity. Cystic lucencies in the right upper lobe, corresponding to emphysematous changes and bronchiectasis on the prior CT. No new consolidation or effusion. Stable cardiomediastinal silhouette with atherosclerosis. No pneumothorax. Degenerative changes of the spine. Surgical clips in the right upper quadrant. IMPRESSION: 1. Slight improved aeration of right upper lobe with residual cystic changes corresponding to emphysematous changes and bronchiectasis noted on prior CT 2. No acute interval change since prior radiograph. Electronically Signed   By: Donavan Foil M.D.   On: 02/02/2017 15:03    Procedures  Procedures (including critical care time)  Medications Ordered in ED Medications  aspirin EC tablet 81 mg (not  administered)  nitroGLYCERIN (NITROSTAT) SL tablet 0.4 mg (not administered)  acetaminophen (TYLENOL) tablet 650 mg (not administered)  ondansetron (ZOFRAN) injection 4 mg (not administered)  ipratropium-albuterol (DUONEB) 0.5-2.5 (3) MG/3ML nebulizer solution 3 mL (not administered)  carvedilol (COREG) tablet 3.125 mg (not administered)  donepezil (ARICEPT) tablet 10 mg (not administered)  isosorbide mononitrate (IMDUR) 24 hr tablet 30 mg (not administered)  ranolazine (RANEXA) 12 hr tablet 1,000 mg (not administered)  insulin aspart (novoLOG) injection 0-15 Units (not administered)  insulin aspart (novoLOG) injection 0-5 Units (not administered)  losartan (COZAAR) tablet 12.5 mg (not administered)  polyvinyl alcohol (LIQUIFILM TEARS) 1.4 % ophthalmic solution 1-2 drop (not administered)  heparin bolus via infusion 4,000 Units (not administered)  heparin ADULT infusion 100 units/mL (25000 units/210mL sodium chloride 0.45%) (not administered)     Initial Impression / Assessment and Plan / ED Course  I have reviewed the triage vital signs and the nursing notes.  Pertinent labs & imaging results that were available during my care of the patient were reviewed by me and considered in my medical decision making (see chart for details).      Labs:  BMP, CBC, I stat trop, UA  Imaging: DG chest  Consults:   Therapeutics: ASA prior to arrival   Discharge Meds:   Assessment/Plan:   83 year old male presents today with complaints of chest pain, potentially near syncope.  Patient was given aspirin prior to arrival here in the ED. patient noted to have elevated troponin here.  Cardiology consulted who agreed to admission.   Final Clinical Impressions(s) / ED Diagnoses   Final diagnoses:  NSTEMI (non-ST elevated myocardial infarction) [facility])    New Prescriptions Current Discharge Medication List       Okey Regal, PA-C 02/02/17 2030    Tegeler, Gwenyth Allegra, MD 02/03/17  1018

## 2017-02-03 ENCOUNTER — Ambulatory Visit: Payer: Medicare Other

## 2017-02-03 DIAGNOSIS — I493 Ventricular premature depolarization: Secondary | ICD-10-CM

## 2017-02-03 DIAGNOSIS — L8990 Pressure ulcer of unspecified site, unspecified stage: Secondary | ICD-10-CM | POA: Insufficient documentation

## 2017-02-03 LAB — BASIC METABOLIC PANEL
Anion gap: 6 (ref 5–15)
BUN: 14 mg/dL (ref 6–20)
CALCIUM: 8.6 mg/dL — AB (ref 8.9–10.3)
CO2: 24 mmol/L (ref 22–32)
CREATININE: 1.1 mg/dL (ref 0.61–1.24)
Chloride: 107 mmol/L (ref 101–111)
GFR, EST NON AFRICAN AMERICAN: 57 mL/min — AB (ref >60)
Glucose, Bld: 94 mg/dL (ref 65–99)
Potassium: 4.4 mmol/L (ref 3.5–5.1)
SODIUM: 137 mmol/L (ref 135–145)

## 2017-02-03 LAB — GLUCOSE, CAPILLARY
GLUCOSE-CAPILLARY: 157 mg/dL — AB (ref 65–99)
GLUCOSE-CAPILLARY: 111 mg/dL — AB (ref 65–99)
Glucose-Capillary: 147 mg/dL — ABNORMAL HIGH (ref 65–99)
Glucose-Capillary: 174 mg/dL — ABNORMAL HIGH (ref 65–99)

## 2017-02-03 LAB — HEPARIN LEVEL (UNFRACTIONATED): HEPARIN UNFRACTIONATED: 0.33 [IU]/mL (ref 0.30–0.70)

## 2017-02-03 LAB — CBC
HCT: 27.6 % — ABNORMAL LOW (ref 39.0–52.0)
Hemoglobin: 9.6 g/dL — ABNORMAL LOW (ref 13.0–17.0)
MCH: 36.4 pg — AB (ref 26.0–34.0)
MCHC: 34.8 g/dL (ref 30.0–36.0)
MCV: 104.5 fL — ABNORMAL HIGH (ref 78.0–100.0)
PLATELETS: 148 10*3/uL — AB (ref 150–400)
RBC: 2.64 MIL/uL — AB (ref 4.22–5.81)
RDW: 14.5 % (ref 11.5–15.5)
WBC: 5.6 10*3/uL (ref 4.0–10.5)

## 2017-02-03 LAB — TROPONIN I
TROPONIN I: 0.7 ng/mL — AB (ref <0.03)
TROPONIN I: 0.82 ng/mL — AB (ref <0.03)

## 2017-02-03 MED ORDER — HEPARIN BOLUS VIA INFUSION
2000.0000 [IU] | Freq: Once | INTRAVENOUS | Status: AC
  Administered 2017-02-03: 2000 [IU] via INTRAVENOUS
  Filled 2017-02-03: qty 2000

## 2017-02-03 MED ORDER — ENSURE ENLIVE PO LIQD
237.0000 mL | Freq: Three times a day (TID) | ORAL | Status: DC
  Administered 2017-02-03 – 2017-02-04 (×3): 237 mL via ORAL

## 2017-02-03 MED ORDER — ISOSORBIDE MONONITRATE ER 60 MG PO TB24
60.0000 mg | ORAL_TABLET | Freq: Every day | ORAL | Status: DC
  Administered 2017-02-03 – 2017-02-04 (×2): 60 mg via ORAL
  Filled 2017-02-03 (×2): qty 1

## 2017-02-03 NOTE — Plan of Care (Signed)
Problem: Activity: Goal: Risk for activity intolerance will decrease Outcome: Progressing Patient endorses progressive generalized weakness. Instructed on use of call light system. Verbalizes understanding of need to call for assistance prior to ambulation when necessary

## 2017-02-03 NOTE — Progress Notes (Signed)
ANTICOAGULATION CONSULT NOTE - FOLLOW UP    HL = 0.33 (goal 0.3 - 0.7 units/mL) Heparin dosing weight = 62 kg   Assessment: 89 YOM on IV heparin for ACS.  Heparin level is therapeutic and toward the low end of normal.  RN reported some oozing around the IV insertion site around 1500, dressing was changed, and no further bleeding was observed.   Plan: Continue heparin gtt at 1000 units/hr F/U AM labs Monitor closely for bleeding   Osmel Dykstra D. Mina Marble, PharmD, BCPS 02/03/2017, 5:23 PM

## 2017-02-03 NOTE — Progress Notes (Signed)
Progress Note  Patient Name: [name] Date of Encounter: 02/03/2017  Primary Cardiologist: Dr. [name]  Subjective   Feeling well  Denies recurrent chest pain or shortness of breath.  He has only ambulated to the bathroom.    Inpatient Medications    Scheduled Meds: . aspirin EC  81 mg Oral Daily  . carvedilol  3.125 mg Oral BID WC  . donepezil  10 mg Oral QHS  . feeding supplement (ENSURE ENLIVE)  237 mL Oral BID BM  . Influenza vac split quadrivalent PF  0.5 mL Intramuscular Tomorrow-1000  . insulin aspart  0-15 Units Subcutaneous TID WC  . insulin aspart  0-5 Units Subcutaneous QHS  . isosorbide mononitrate  30 mg Oral Daily  . losartan  12.5 mg Oral Daily  . ranolazine  1,000 mg Oral BID   Continuous Infusions: . heparin 1,000 Units/hr (02/03/17 0741)   PRN Meds: acetaminophen, ipratropium-albuterol, nitroGLYCERIN, ondansetron (ZOFRAN) IV, polyvinyl alcohol   Vital Signs    Vitals:   02/02/17 2155 02/03/17 0001 02/03/17 0010 02/03/17 0521  BP:   (!) 111/59 137/69  Pulse: 89  87 81  Resp:   18 (!) 27  Temp:   97.9 F (36.6 C) 98 F (36.7 C)  TempSrc:   Oral Oral  SpO2:   96% 96%  Weight:  59.8 kg (131 lb 13.4 oz)    Height:        Intake/Output Summary (Last 24 hours) at 02/03/17 0929 Last data filed at 02/03/17 0650  Gross per 24 hour  Intake            199.6 ml  Output                0 ml  Net            199.6 ml   Filed Weights   02/02/17 1415 02/02/17 2013 02/03/17 0001  Weight: 62.1 kg (137 lb) 59.8 kg (131 lb 12.8 oz) 59.8 kg (131 lb 13.4 oz)    Telemetry    Sinus rhythm.  Frequent PVCs and PACs.  - Personally Reviewed  ECG    Sinus rhythm.  Rate 81 bpm.  Frequent PVCs.- Personally Reviewed  Physical Exam   GEN: No acute distress.   Neck: No JVD Cardiac: Mostly regular with frequent ectopy.  No murmurs, rubs, or gallops.  Respiratory: Clear to auscultation bilaterally. GI: Soft, nontender, non-distended  MS: No edema; No  deformity. Neuro:  Nonfocal  Psych: Normal affect   Labs    Chemistry Recent Labs Lab 02/02/17 1418 02/03/17 0619  NA 136 137  K 4.1 4.4  CL 106 107  CO2 24 24  GLUCOSE 161* 94  BUN 14 14  CREATININE 1.19 1.10  CALCIUM 8.3* 8.6*  GFRNONAA 52* 57*  GFRAA 60* >60  ANIONGAP 6 6     Hematology Recent Labs Lab 02/02/17 1418 02/03/17 0619  WBC 5.4 5.6  RBC 2.64* 2.64*  HGB 9.3* 9.6*  HCT 27.7* 27.6*  MCV 104.9* 104.5*  MCH 35.2* 36.4*  MCHC 33.6 34.8  RDW 14.1 14.5  PLT 146* 148*    Cardiac Enzymes Recent Labs Lab 02/02/17 1837 02/03/17 0008 02/03/17 0619  TROPONINI 0.59* 0.70* 0.82*    Recent Labs Lab 02/02/17 1448  TROPIPOC 0.56*     BNPNo results for input(s): BNP, PROBNP in the last 168 hours.   DDimer No results for input(s): DDIMER in the last 168 hours.   Radiology  Dg Chest 2 View  Result Date: 02/02/2017 CLINICAL DATA:  Chest pain EXAM: CHEST  2 VIEW COMPARISON:  01/15/2017, CT chest 01/15/2017 FINDINGS: Hyperinflation. Coarse chronic interstitial opacity. Partial clearing of right upper lobe opacity. Cystic lucencies in the right upper lobe, corresponding to emphysematous changes and bronchiectasis on the prior CT. No new consolidation or effusion. Stable cardiomediastinal silhouette with atherosclerosis. No pneumothorax. Degenerative changes of the spine. Surgical clips in the right upper quadrant. IMPRESSION: 1. Slight improved aeration of right upper lobe with residual cystic changes corresponding to emphysematous changes and bronchiectasis noted on prior CT 2. No acute interval change since prior radiograph. Electronically Signed   By: Donavan Foil M.D.   On: 02/02/2017 15:03    Cardiac Studies   Echo  01/03/17: Study Conclusions  - Left ventricle: The cavity size was normal. Systolic function was   moderately reduced. The estimated ejection fraction was in the   range of 35% to 40%. Wall motion was normal; there were no   regional  wall motion abnormalities. Doppler parameters are   consistent with abnormal left ventricular relaxation (grade 1   diastolic dysfunction). Doppler parameters are consistent with   elevated ventricular end-diastolic filling pressure. - Aortic valve: Trileaflet; mildly thickened, mildly calcified   leaflets. Mean gradient (S): 21 mm Hg. Peak gradient (S): 38 mm   Hg. Valve area (VTI): 0.75 cm^2. Valve area (Vmax): 0.72 cm^2.   Valve area (Vmean): 0.72 cm^2. - Aortic root: The aortic root was normal in size. - Mitral valve: There was trivial regurgitation. - Left atrium: The atrium was normal in size. - Right ventricle: Systolic function was normal. - Tricuspid valve: There was trivial regurgitation. - Pulmonic valve: There was no regurgitation. - Pulmonary arteries: Systolic pressure was within the normal   range.  24 Hour Holter 10/15/16:  NSR  Occasional PACs  Occasional PVCs and couplets ( 1%)   LHC 05/20/16:  The left ventricular systolic function is normal.  LV end diastolic pressure is normal.  Prox LAD to Mid LAD lesion, 0 %stenosed.  2nd Diag lesion, 80 %stenosed.  Ost RCA lesion, 50 %stenosed.  Patient Profile   [name] is a 50M with CAD status post PCI in 2012, moderate aortic stenosis, chronic systolic and diastolic heart failure, COPD, hypertension, hyperlipidemia, Barrett's esophagus, CKD III, and recurrent metastatic colorectal cancer here with chest pain and positive troponin.  Assessment & Plan    # NSTEMI: # CAD s/p PCI: Troponin is elevated and continues to rise (currently 0.82).  He has a  known 80% blockage in D2.  It was not stented 05/2016 because it was small and medical management.   [name] is currently chest pain free.  Though he hasn't been very active yet. Given his metastatic colorectal cancer and advanced age, we will try to manage this medically.  Increase Imdur to 60mg .  Continue heparin for 48 hours.  Continue aspirin, carvedilol, and  ranolazine. He has not been on a statin.  LDL was 60 05/2016.  We will repeat this.    # Chronic systolic and diastolic heart failure: Likely mixed ischemic and PVC-induced.    # Frequent PVCs: Continue carvedilol.  He may benefit from switching to metoprolol.  For questions or updates, please contact [place] Please consult [email or web address] for contact info under Cardiology/STEMI.      Signed, Skeet Latch, MD  02/03/2017, 9:29 AM

## 2017-02-03 NOTE — Progress Notes (Signed)
Initial Nutrition Assessment  DOCUMENTATION CODES:   Severe malnutrition in context of chronic illness  INTERVENTION:    Ensure Enlive po TID, each supplement provides 350 kcal and 20 grams of protein  NUTRITION DIAGNOSIS:   Malnutrition (severe) related to chronic illness (cancer with newly found mets; CAD; CKD) as evidenced by severe depletion of body fat, severe depletion of muscle mass, percent weight loss (7% weight loss within the past month).  GOAL:   Patient will meet greater than or equal to 90% of their needs  MONITOR:   PO intake, Supplement acceptance, Skin  REASON FOR ASSESSMENT:   Malnutrition Screening Tool    ASSESSMENT:   84 yo male with PMH of HTN, HLD, Barrett's esophagus, PAF, DM-II, CAD, CHF, CKD, colon CA, newly found liver metastases, who was admitted on 9/26 with NSTEMI.  Spoke with patient and his family. They report that patient has lost a lot of weight and has been eating less than usual. He eats 3 meals per day, but eats less than he used to. Recently has been feeling poorly, which has caused decreased appetite. He drinks Boost at home and likes all flavors. Has been drinking Ensure well since admission.   Nutrition-Focused physical exam completed. Findings are severe fat depletion, moderate-severe muscle depletion, and no edema.   Patient with at least 7% weight loss within the past month.  Labs and medications reviewed. CBG's: 174-157  Diet Order:  Diet Heart Room service appropriate? Yes; Fluid consistency: Thin  Skin:  Wound (see comment) (stage I pressure injury to back; MASD to buttocks)  Last BM:  9/26  Height:   Ht Readings from Last 1 Encounters:  02/02/17 5\' 9"  (1.753 m)    Weight:   Wt Readings from Last 1 Encounters:  02/03/17 131 lb 13.4 oz (59.8 kg)    Ideal Body Weight:  72.7 kg  BMI:  Body mass index is 19.47 kg/m.  Estimated Nutritional Needs:   Kcal:  1700-1900  Protein:  85-95 gm  Fluid:  1.7-1.9  L  EDUCATION NEEDS:   Education needs addressed (discussed ways to increase protein and calorie intake)  Molli Barrows, RD, LDN, [place] Pager [phone] After Hours Pager [phone]

## 2017-02-03 NOTE — Progress Notes (Signed)
[place] for heparin Indication: chest pain/ACS  Allergies  Allergen Reactions  . Metformin And Related Diarrhea and Nausea And Vomiting    Patient Measurements: Height: 5\' 9"  (175.3 cm) Weight: 131 lb 13.4 oz (59.8 kg) IBW/kg (Calculated) : 70.7 Heparin Dosing Weight: 62.1 kg  Vital Signs: Temp: 98 F (36.7 C) (09/27 0521) Temp Source: Oral (09/27 0521) BP: 137/69 (09/27 0521) Pulse Rate: 81 (09/27 0521)  Labs:  Recent Labs  02/02/17 1418 02/02/17 1837 02/03/17 0008 02/03/17 0619  HGB 9.3*  --   --  9.6*  HCT 27.7*  --   --  27.6*  PLT 146*  --   --  148*  HEPARINUNFRC  --   --   --  <0.10*  CREATININE 1.19  --   --   --   TROPONINI  --  0.59* 0.70*  --     Estimated Creatinine Clearance: 34.2 mL/min (by C-G formula based on SCr of 1.19 mg/dL).  Assessment: 83 y.o. male with chest pain and elevated cardiac markers for heparin  Goal of Therapy:  Heparin level 0.3-0.7 units/ml Monitor platelets by anticoagulation protocol: Yes   Plan:  Heparin 2000 units IV bolus, then increase heparin 1000 units/hr Check heparin level in 8 hours.   Previn Jian, [place] 02/03/2017,7:38 AM

## 2017-02-04 ENCOUNTER — Ambulatory Visit: Payer: Medicare Other

## 2017-02-04 LAB — BASIC METABOLIC PANEL
Anion gap: 6 (ref 5–15)
BUN: 17 mg/dL (ref 6–20)
CO2: 24 mmol/L (ref 22–32)
CREATININE: 1.02 mg/dL (ref 0.61–1.24)
Calcium: 8.5 mg/dL — ABNORMAL LOW (ref 8.9–10.3)
Chloride: 107 mmol/L (ref 101–111)
GFR calc Af Amer: >60 mL/min (ref >60)
GFR calc non Af Amer: >60 mL/min (ref >60)
Glucose, Bld: 112 mg/dL — ABNORMAL HIGH (ref 65–99)
POTASSIUM: 3.8 mmol/L (ref 3.5–5.1)
Sodium: 137 mmol/L (ref 135–145)

## 2017-02-04 LAB — CBC
HCT: 28.9 % — ABNORMAL LOW (ref 39.0–52.0)
HEMOGLOBIN: 9.6 g/dL — AB (ref 13.0–17.0)
MCH: 34.4 pg — ABNORMAL HIGH (ref 26.0–34.0)
MCHC: 33.2 g/dL (ref 30.0–36.0)
MCV: 103.6 fL — AB (ref 78.0–100.0)
Platelets: 143 10*3/uL — ABNORMAL LOW (ref 150–400)
RBC: 2.79 MIL/uL — ABNORMAL LOW (ref 4.22–5.81)
RDW: 14 % (ref 11.5–15.5)
WBC: 5.5 10*3/uL (ref 4.0–10.5)

## 2017-02-04 LAB — LIPID PANEL
CHOL/HDL RATIO: 3.2 ratio
Cholesterol: 120 mg/dL (ref 0–200)
HDL: 38 mg/dL — ABNORMAL LOW (ref >40)
LDL CALC: 70 mg/dL (ref 0–99)
Triglycerides: 58 mg/dL (ref <150)
VLDL: 12 mg/dL (ref 0–40)

## 2017-02-04 LAB — GLUCOSE, CAPILLARY
GLUCOSE-CAPILLARY: 114 mg/dL — AB (ref 65–99)
Glucose-Capillary: 174 mg/dL — ABNORMAL HIGH (ref 65–99)

## 2017-02-04 LAB — HEPARIN LEVEL (UNFRACTIONATED): Heparin Unfractionated: 0.3 IU/mL (ref 0.30–0.70)

## 2017-02-04 MED ORDER — ENOXAPARIN SODIUM 40 MG/0.4ML ~~LOC~~ SOLN
40.0000 mg | Freq: Once | SUBCUTANEOUS | Status: AC
  Administered 2017-02-04: 40 mg via SUBCUTANEOUS
  Filled 2017-02-04: qty 0.4

## 2017-02-04 MED ORDER — LOSARTAN POTASSIUM 25 MG PO TABS: 12.5000 mg | ORAL_TABLET | Freq: Every day | ORAL | 5 refills | Status: AC

## 2017-02-04 MED ORDER — ISOSORBIDE MONONITRATE ER 60 MG PO TB24: 60.0000 mg | ORAL_TABLET | Freq: Every day | ORAL | 11 refills | Status: DC

## 2017-02-04 MED ORDER — ATORVASTATIN CALCIUM 10 MG PO TABS: 10.0000 mg | ORAL_TABLET | Freq: Every day | ORAL | Status: DC

## 2017-02-04 MED ORDER — ATORVASTATIN CALCIUM 10 MG PO TABS: 10.0000 mg | ORAL_TABLET | Freq: Every day | ORAL | 5 refills | Status: AC

## 2017-02-04 NOTE — Care Management Note (Addendum)
Case Management Note  Patient Details  Name: SCHNEIDER WARCHOL MRN: [id number] Date of Birth: 18-Jul-1923  Subjective/Objective:  Pt presented for Nstemi- from home with the support of his sister. Pt has an aide from 10:00 am to 5:00 pm.                    Action/Plan: CM did discuss with pt in regards to [place]. Pt/ Family felt like he would not need any HH Services at this time. CM did make Pt aware that he can always call his PCP if he needs [facility] services once he gets home. No further needs from CM at this time.   Expected Discharge Date:  02/04/17               Expected Discharge Plan:  Home/Self Care  In-House Referral:  NA  Discharge planning Services  CM Consult  Post Acute Care Choice:  NA Choice offered to:  NA  DME Arranged:  N/A DME Agency:  NA  HH Arranged:  Pt Refused [place] Agency:  NA  Status of Service:  Completed, signed off  If discussed at [place] of Stay Meetings, dates discussed:    Additional Comments:  Bethena Roys, RN 02/04/2017, 11:52 AM

## 2017-02-04 NOTE — Progress Notes (Addendum)
ANTICOAGULATION CONSULT NOTE - Follow Up Consult  Pharmacy Consult for heparin/enoxparin Indication: chest pain/ACS  Allergies  Allergen Reactions  . Metformin And Related Diarrhea and Nausea And Vomiting    Patient Measurements: Height: 5\' 9"  (175.3 cm) Weight: 127 lb 3.2 oz (57.7 kg) IBW/kg (Calculated) : 70.7 Heparin Dosing Weight: 62.1 kg  Vital Signs: Temp: 98.1 F (36.7 C) (09/28 0505) Temp Source: Oral (09/28 0505) BP: 142/64 (09/28 0505) Pulse Rate: 90 (09/28 0505)  Labs:  Recent Labs  02/02/17 1418 02/02/17 1837 02/03/17 0008 02/03/17 0619 02/03/17 1627 02/04/17 0408  HGB 9.3*  --   --  9.6*  --  9.6*  HCT 27.7*  --   --  27.6*  --  28.9*  PLT 146*  --   --  148*  --  143*  HEPARINUNFRC  --   --   --  <0.10* 0.33 0.30  CREATININE 1.19  --   --  1.10  --  1.02  TROPONINI  --  0.59* 0.70* 0.82*  --   --     Estimated Creatinine Clearance: 38.5 mL/min (by C-G formula based on SCr of 1.02 mg/dL).   Medications:  Scheduled:  . aspirin EC  81 mg Oral Daily  . carvedilol  3.125 mg Oral BID WC  . donepezil  10 mg Oral QHS  . feeding supplement (ENSURE ENLIVE)  237 mL Oral TID BM  . Influenza vac split quadrivalent PF  0.5 mL Intramuscular Tomorrow-1000  . insulin aspart  0-15 Units Subcutaneous TID WC  . insulin aspart  0-5 Units Subcutaneous QHS  . isosorbide mononitrate  60 mg Oral Daily  . losartan  12.5 mg Oral Daily  . ranolazine  1,000 mg Oral BID   Infusions:  . heparin 1,000 Units/hr (02/03/17 2029)    Assessment: 84 y/o male with hx of metastatic colon cancer on palliative XRT, on IV heparin for ACS/NSTEMI.  Per MD NSTEMI to be managed medically given advanced age and comorbidites, heparin to discontinue after 48 hours. Patient has been pulling out IV lines this morning, will stop heparin and switch to enoxaparin. Given advanced age and bleeding risk, 0.75 mg/kg is appropriate enoxaparin dose. -Heparin level was 0.30 at low end of goal range.  No further bleeding noted.   Goal of Therapy:  Heparin level 0.3-0.7 units/ml Monitor platelets by anticoagulation protocol: Yes   Plan:  Discontinue heparin, administer enoxaparin 40 mg [place] x 1 ~1 hour after heparin stopped. To complete ~48 hours of therapy Discontinue daily heparin level Monitor CBC, s/sx of bleeding   Charlene Brooke, PharmD PGY1 Pharmacy Resident Pager: [phone] After 4:00PM please call Main Pharmacy [phone] 02/04/2017,9:17 AM

## 2017-02-04 NOTE — Progress Notes (Signed)
Progress Note  Patient Name: [name] Date of Encounter: 02/04/2017  Primary Cardiologist: Dr. [name]  Subjective   Feeling well  Denies recurrent chest pain or shortness of breath.  More confused overnight.   Inpatient Medications    Scheduled Meds: . aspirin EC  81 mg Oral Daily  . carvedilol  3.125 mg Oral BID WC  . donepezil  10 mg Oral QHS  . enoxaparin (LOVENOX) injection  40 mg Subcutaneous Once  . feeding supplement (ENSURE ENLIVE)  237 mL Oral TID BM  . Influenza vac split quadrivalent PF  0.5 mL Intramuscular Tomorrow-1000  . insulin aspart  0-15 Units Subcutaneous TID WC  . insulin aspart  0-5 Units Subcutaneous QHS  . isosorbide mononitrate  60 mg Oral Daily  . losartan  12.5 mg Oral Daily  . ranolazine  1,000 mg Oral BID   Continuous Infusions:  PRN Meds: acetaminophen, ipratropium-albuterol, nitroGLYCERIN, ondansetron (ZOFRAN) IV, polyvinyl alcohol   Vital Signs    Vitals:   02/03/17 1734 02/03/17 1957 02/03/17 2300 02/04/17 0505  BP: 106/85 119/63 129/67 (!) 142/64  Pulse: 84 79 90 90  Resp:  18 (!) 28 18  Temp:  98.7 F (37.1 C) 98.2 F (36.8 C) 98.1 F (36.7 C)  TempSrc:  Oral Oral Oral  SpO2:  90% 99% 95%  Weight:    57.7 kg (127 lb 3.2 oz)  Height:        Intake/Output Summary (Last 24 hours) at 02/04/17 1022 Last data filed at 02/03/17 1700  Gross per 24 hour  Intake              600 ml  Output                0 ml  Net              600 ml   Filed Weights   02/02/17 2013 02/03/17 0001 02/04/17 0505  Weight: 59.8 kg (131 lb 12.8 oz) 59.8 kg (131 lb 13.4 oz) 57.7 kg (127 lb 3.2 oz)    Telemetry    Sinus rhythm.  Frequent PVCs and PACs.  - Personally Reviewed  ECG    Sinus rhythm.  Rate 81 bpm.  Frequent PVCs.- Personally Reviewed  Physical Exam   VS:  BP (!) 142/64 (BP Location: Right Arm)   Pulse 90   Temp 98.1 F (36.7 C) (Oral)   Resp 18   Ht 5\' 9"  (1.753 m)   Wt 57.7 kg (127 lb 3.2 oz)   SpO2 95%   BMI  18.78 kg/m  , BMI Body mass index is 18.78 kg/m. GENERAL:  Well appearing HEENT: Pupils equal round and reactive, fundi not visualized, oral mucosa unremarkable NECK:  No jugular venous distention, waveform within normal limits, carotid upstroke brisk and symmetric, no bruits, no thyromegaly LYMPHATICS:  No cervical adenopathy LUNGS:  Clear to auscultation bilaterally HEART:  RRR.  PMI not displaced or sustained,S1 and S2 within normal limits, no S3, no S4, no clicks, no rubs,  murmurs ABD:  Flat, positive bowel sounds normal in frequency in pitch, no bruits, no rebound, no guarding, no midline pulsatile mass, no hepatomegaly, no splenomegaly EXT:  2 plus pulses throughout, no edema, no cyanosis no clubbing SKIN:  No rashes no nodules NEURO:  Cranial nerves II through XII grossly intact, motor grossly intact throughout [facility]:  Cognitively intact, oriented to person place and time   [employer]  Recent Labs Lab 02/02/17  1418 02/03/17 0619 02/04/17 0408  NA 136 137 137  K 4.1 4.4 3.8  CL 106 107 107  CO2 24 24 24   GLUCOSE 161* 94 112*  BUN 14 14 17   CREATININE 1.19 1.10 1.02  CALCIUM 8.3* 8.6* 8.5*  GFRNONAA 52* 57* >60  GFRAA 60* >60 >60  ANIONGAP 6 6 6      Hematology  Recent Labs Lab 02/02/17 1418 02/03/17 0619 02/04/17 0408  WBC 5.4 5.6 5.5  RBC 2.64* 2.64* 2.79*  HGB 9.3* 9.6* 9.6*  HCT 27.7* 27.6* 28.9*  MCV 104.9* 104.5* 103.6*  MCH 35.2* 36.4* 34.4*  MCHC 33.6 34.8 33.2  RDW 14.1 14.5 14.0  PLT 146* 148* 143*    Cardiac Enzymes  Recent Labs Lab 02/02/17 1837 02/03/17 0008 02/03/17 0619  TROPONINI 0.59* 0.70* 0.82*     Recent Labs Lab 02/02/17 1448  TROPIPOC 0.56*     BNPNo results for input(s): BNP, PROBNP in the last 168 hours.   DDimer No results for input(s): DDIMER in the last 168 hours.   Radiology    Dg Chest 2 View  Result Date: 02/02/2017 CLINICAL DATA:  Chest pain EXAM: CHEST  2 VIEW COMPARISON:  01/15/2017, CT chest  01/15/2017 FINDINGS: Hyperinflation. Coarse chronic interstitial opacity. Partial clearing of right upper lobe opacity. Cystic lucencies in the right upper lobe, corresponding to emphysematous changes and bronchiectasis on the prior CT. No new consolidation or effusion. Stable cardiomediastinal silhouette with atherosclerosis. No pneumothorax. Degenerative changes of the spine. Surgical clips in the right upper quadrant. IMPRESSION: 1. Slight improved aeration of right upper lobe with residual cystic changes corresponding to emphysematous changes and bronchiectasis noted on prior CT 2. No acute interval change since prior radiograph. Electronically Signed   By: Donavan Foil M.D.   On: 02/02/2017 15:03    Cardiac Studies   Echo  01/03/17: Study Conclusions  - Left ventricle: The cavity size was normal. Systolic function was   moderately reduced. The estimated ejection fraction was in the   range of 35% to 40%. Wall motion was normal; there were no   regional wall motion abnormalities. Doppler parameters are   consistent with abnormal left ventricular relaxation (grade 1   diastolic dysfunction). Doppler parameters are consistent with   elevated ventricular end-diastolic filling pressure. - Aortic valve: Trileaflet; mildly thickened, mildly calcified   leaflets. Mean gradient (S): 21 mm Hg. Peak gradient (S): 38 mm   Hg. Valve area (VTI): 0.75 cm^2. Valve area (Vmax): 0.72 cm^2.   Valve area (Vmean): 0.72 cm^2. - Aortic root: The aortic root was normal in size. - Mitral valve: There was trivial regurgitation. - Left atrium: The atrium was normal in size. - Right ventricle: Systolic function was normal. - Tricuspid valve: There was trivial regurgitation. - Pulmonic valve: There was no regurgitation. - Pulmonary arteries: Systolic pressure was within the normal   range.  24 Hour Holter 10/15/16:  NSR  Occasional PACs  Occasional PVCs and couplets ( 1%)   LHC 05/20/16:  The left  ventricular systolic function is normal.  LV end diastolic pressure is normal.  Prox LAD to Mid LAD lesion, 0 %stenosed.  2nd Diag lesion, 80 %stenosed.  Ost RCA lesion, 50 %stenosed.  Patient Profile   Mr [name] is a 43M with CAD status post PCI in 2012, moderate aortic stenosis, chronic systolic and diastolic heart failure, COPD, hypertension, hyperlipidemia, Barrett's esophagus, CKD III, and recurrent metastatic colorectal cancer here with chest pain and positive troponin.  Assessment &  Plan    # NSTEMI: # CAD s/p PCI: Troponin is elevated to 0.82.  He has a  known 80% blockage in D2.  It was not stented 05/2016 because it was small and medical management.   [name] is currently chest pain free.  We will get him up and walk.  He keeps losing IVs so heparin was switched to Lovenox.  Only one injection needed to get him to 48 hours on heparin/lovenox.  We will get him up to walk.  If he is chest pain free then he is stable to go home.  Imdur was increased to 60 this admission. Continue aspirin, carvedilol, and ranolazine. He has not been on a statin.  LDL was 70 this admission so we will start atorvastatin 10 mg daily.  Needs lipids and CMP in 6 weeks.    # Chronic systolic and diastolic heart failure: Likely mixed ischemic and PVC-induced.  Euvolemic.   # Frequent PVCs: Continue carvedilol.  He may benefit from switching to metoprolol.  For questions or updates, please contact [place] Please consult [email or web address] for contact info under Cardiology/STEMI.      Signed, Skeet Latch, MD  02/04/2017, 10:22 AM

## 2017-02-04 NOTE — Discharge Summary (Signed)
Discharge Summary    Patient ID: [name],  MRN: [id number], DOB/AGE: January 08, 1924 83 y.o.  Admit date: 02/02/2017 Discharge date: 02/04/2017  Primary Care Provider: Lavone Orn Primary Cardiologist: Dr. [name]  Discharge Diagnoses    Active Problems:   NSTEMI (non-ST elevated myocardial infarction) ([place])   Pressure injury of skin   Allergies Allergies  Allergen Reactions  . Metformin And Related Diarrhea and Nausea And Vomiting    Diagnostic Studies/Procedures    None _____________   History of Present Illness     Mr. [name] is a 89M with CAD status post PCI in 2012, moderate aortic stenosis, chronic systolic and diastolic heart failure, COPD, hypertension, hyperlipidemia, Barrett's esophagus, CKD III, and recurrent metastatic colorectal cancer here with chest pain and positive troponin.  Mr. [name] was feeling well until he developed sudden onset left-sided chest pain. It was severe in intensity and did not radiate. He describes it as a pressure that was associated with shortness of breath and nausea but no diaphoresis. The symptoms started suddenly while sitting down. It was worse with exertion. He has not noted any lower extremity edema, orthopnea, or PND.  He reports that is breathing has returned to baseline and he is currently chest pain free.   Mr. [name] with admitted earlier this month for syncope and pneumonia that was successfully treated with levofloxacin. Echo that admission revealed LVEF 35-40% with moderate aortic stenosis.  Troponin was mildly elevated to 0.03.  At that time he was not felt to be a candidate for aggressive interventions due to his age and metastatic disease.  Mr. [name] had a nuclear stress test 08/2016 that showed infarct but no ischemia. LVEF was 48%.  He had a LHC 05/2016 that revealed 80% D2 and 50% ostial RCA disease.  His LAD stent was patent. At that time he was thought to have chronic, stable angina and medical management  was recommended.  Mr. [name] last saw his oncologist, Dr. Burr Medico, 01/11/17.  At that time his CT scan showed liver metastases.  He was started on palliative radiation. Liver biopsy confirmed metastatic disease.   Hospital Course     Consultants: None  Troponin elevated to 0.82. He has a  known 80% blockage in D2.  It was not stented 05/2016 because it was small and medical management. Currently he is being managed medically given his metastatic colorectal cancer and advanced age. Mr. [name] is currently chest pain free. He has received 48 hours of heparin. Imdur was increased to 60 mg this admission. Continue aspirin, carvedilol, and ranolazine. He has not been on a statin. LDL was 70 this admission so we will start atorvastatin 10 mg daily.  Needs lipids and CMP in 6 weeks.   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 He is currently euvolemic. Continuing on carvedilol to suppress PVCs. He may benefit from switching to metoprolol.  Amlodipine was stopped and losartan was initiated. Blood pressure is currently well controlled.   He has been up walking without chest pain and will be discharged home.   Patient has been seen by Dr. Oval Linsey today and deemed ready for discharge home. All follow up appointments have been scheduled. Discharge medications are listed below. _____________  Discharge Vitals Blood pressure (!) 142/64, pulse 90, temperature 98.1 F (36.7 C), temperature source Oral, resp. rate 18, height 5\' 9"  (1.753 m), weight 127 lb 3.2 oz (57.7 kg), SpO2 95 %.  Filed Weights  02/02/17 2013 02/03/17 0001 02/04/17 0505  Weight: 131 lb 12.8 oz (59.8 kg) 131 lb 13.4 oz (59.8 kg) 127 lb 3.2 oz (57.7 kg)    Labs & Radiologic Studies    CBC  Recent Labs  02/03/17 0619 02/04/17 0408  WBC 5.6 5.5  HGB 9.6* 9.6*  HCT 27.6* 28.9*  MCV 104.5* 103.6*  PLT 148* 932*   Basic Metabolic Panel  Recent Labs  02/03/17 0619  02/04/17 0408  NA 137 137  K 4.4 3.8  CL 107 107  CO2 24 24  GLUCOSE 94 112*  BUN 14 17  CREATININE 1.10 1.02  CALCIUM 8.6* 8.5*   Liver Function Tests No results for input(s): AST, ALT, ALKPHOS, BILITOT, PROT, ALBUMIN in the last 72 hours. No results for input(s): LIPASE, AMYLASE in the last 72 hours. Cardiac Enzymes  Recent Labs  02/02/17 1837 02/03/17 0008 02/03/17 0619  TROPONINI 0.59* 0.70* 0.82*   BNP Invalid input(s): POCBNP D-Dimer No results for input(s): DDIMER in the last 72 hours. Hemoglobin A1C No results for input(s): HGBA1C in the last 72 hours. Fasting Lipid Panel  Recent Labs  02/04/17 0408  CHOL 120  HDL 38*  LDLCALC 70  TRIG 58  CHOLHDL 3.2   Thyroid Function Tests No results for input(s): TSH, T4TOTAL, T3FREE, THYROIDAB in the last 72 hours.  Invalid input(s): FREET3 _____________  Dg Chest 2 View  Result Date: 02/02/2017 CLINICAL DATA:  Chest pain EXAM: CHEST  2 VIEW COMPARISON:  01/15/2017, CT chest 01/15/2017 FINDINGS: Hyperinflation. Coarse chronic interstitial opacity. Partial clearing of right upper lobe opacity. Cystic lucencies in the right upper lobe, corresponding to emphysematous changes and bronchiectasis on the prior CT. No new consolidation or effusion. Stable cardiomediastinal silhouette with atherosclerosis. No pneumothorax. Degenerative changes of the spine. Surgical clips in the right upper quadrant. IMPRESSION: 1. Slight improved aeration of right upper lobe with residual cystic changes corresponding to emphysematous changes and bronchiectasis noted on prior CT 2. No acute interval change since prior radiograph. Electronically Signed   By: Donavan Foil M.D.   On: 02/02/2017 15:03   Ct Angio Chest Pe W/cm &/or Wo Cm  Result Date: 01/15/2017 CLINICAL DATA:  Left-sided chest pain, atrial fibrillation. Elevated D-dimer. Newly diagnosed rectal cancer with liver lesions. EXAM: CT ANGIOGRAPHY CHEST WITH CONTRAST TECHNIQUE:  Multidetector CT imaging of the chest was performed using the standard protocol during bolus administration of intravenous contrast. Multiplanar CT image reconstructions and MIPs were obtained to evaluate the vascular anatomy. CONTRAST:  100 cc Isovue 370 COMPARISON:  Chest CT dated 12/23/2016. FINDINGS: Cardiovascular: There is no pulmonary embolism identified within the main, lobar or segmental pulmonary arteries bilaterally. Aortic atherosclerosis. No aortic aneurysm. Heart size is upper normal. No pericardial effusion. Diffuse coronary artery calcifications. Mediastinum/Nodes: Scattered small and mildly prominent lymph nodes are again seen within the mediastinum and perihilar regions, unchanged in the short-term interval. No obvious pathologically enlarged lymph nodes identified. Esophagus appears normal. Trachea and central bronchi are unremarkable,. Lungs/Pleura: Advanced changes of chronic interstitial lung disease are emphysema, not significantly changed compared to the recent chest CT. There is increased consolidation within the right upper lobe, at the posterior margin of previously described emphysematous change and cylindrical bronchiectasis, suspicious for developing pneumonia. 8 mm pulmonary nodule noted at the left lung apex, stable in the short-term interval new compared to earlier chest CT of 08/09/2010. Small bilateral pleural effusions. Upper Abdomen: Limited images of the upper abdomen are unremarkable, characterization limited by the early contrast  phase, no appreciable contrast as yet in the upper abdomen at the time of imaging. The liver lesions described on earlier CT are not at able to be visualized. Musculoskeletal: No acute or suspicious osseous finding. Review of the MIP images confirms the above findings. IMPRESSION: 1. No pulmonary embolism identified. 2. Increased consolidation (compared to recent chest CT of 12/23/2016) within the right upper lobe at the posterior margin of previously  described advanced emphysematous change and cylindrical bronchiectasis. This increased consolidation is highly suspicious for pneumonia. Atelectasis is considered less likely. 3. Advanced emphysematous changes and chronic interstitial lung disease, upper lobe predominant, with associated cylindrical bronchiectasis. 4. 8 mm pulmonary nodule within the left upper lobe (series 6, image 37). This most likely represents nodular atelectasis or fibrosis, however, neoplastic nodule is certainly not excluded in the setting of emphysema and known rectal cancer. PET-CT may be helpful for further characterization. At minimum, recommend follow-up chest CT in 2-3 months to ensure stability or resolution. 5. Small bilateral pleural effusions. 6. Aortic atherosclerosis. 7. Diffuse coronary artery calcifications. Recommend correlation with any possible cardiac symptoms. Aortic Atherosclerosis (ICD10-I70.0) and Emphysema (ICD10-J43.9). Electronically Signed   By: Franki Cabot M.D.   On: 01/15/2017 16:27   US Biopsy  Result Date: 01/06/2017 INDICATION: Rectal cancer and liver lesions. EXAM: ULTRASOUND-GUIDED LIVER LESION BIOPSY MEDICATIONS: None. ANESTHESIA/SEDATION: Fentanyl 50 mcg The patient's level of consciousness and vital signs were monitored continuously by radiology nursing throughout the procedure under my direct supervision. FLUOROSCOPY TIME:  None COMPLICATIONS: None immediate. PROCEDURE: Informed written consent was obtained from the patient after a thorough discussion of the procedural risks, benefits and alternatives. All questions were addressed. A timeout was performed prior to the initiation of the procedure. Liver was evaluated with ultrasound. A lesion in the right hepatic lobe was selected for biopsy. Right side of the abdomen was prepped and draped in a sterile fashion. Skin and soft tissues were anesthetized with 1% lidocaine. 17 gauge coaxial needle was directed into right hepatic lesion with ultrasound  guidance. Three core biopsies obtained with an 18 gauge core device. Specimens placed in formalin. 17 gauge needle was removed without complication. Bandage placed over the puncture site. FINDINGS: Small isoechoic lesions in the right hepatic lobe. 2 cm lesion was biopsied. No significant bleeding or hematoma formation after the core biopsies. IMPRESSION: Successful ultrasound-guided core biopsies of a right hepatic lesion. Electronically Signed   By: Markus Daft M.D.   On: 01/06/2017 14:05   Dg Chest Port 1 View  Result Date: 01/15/2017 CLINICAL DATA:  Left-sided chest pain. EXAM: PORTABLE CHEST 1 VIEW COMPARISON:  01/01/2017 FINDINGS: The cardiac silhouette is borderline enlarged, unchanged. Aortic atherosclerosis is noted. Advanced chronic lung disease is again seen with interstitial coarsening diffusely as well as bullous changes and asymmetric density in the right upper lobe, overall not significantly changed. No definite superimposed acute airspace consolidation, edema, sizable pleural effusion, or pneumothorax is identified. No acute osseous abnormality is seen. IMPRESSION: Chronic lung disease without definite acute abnormality. Electronically Signed   By: Logan Bores M.D.   On: 01/15/2017 13:26   Disposition   Pt is being discharged home today in good condition.  Follow-up Plans & Appointments    Follow-up Information    Liliane Shi, PA-C Follow up.   Specialties:  Cardiology, Physician Assistant Why:  On October 16th at 2:45 for cardiology hospital follow up.  Contact information: [address] [place] [address] [phone]  Discharge Instructions    (HEART FAILURE PATIENTS) Call MD:  Anytime you have any of the following symptoms: 1) 3 pound weight gain in 24 hours or 5 pounds in 1 week 2) shortness of breath, with or without a dry hacking cough 3) swelling in the hands, feet or stomach 4) if you have to sleep on extra pillows at night in order  to breathe.    Complete by:  As directed    Diet - low sodium heart healthy    Complete by:  As directed    Increase activity slowly    Complete by:  As directed       Discharge Medications   Current Discharge Medication List    START taking these medications   Details  losartan (COZAAR) 25 MG tablet Take 0.5 tablets (12.5 mg total) by mouth daily. Qty: 15 tablet, Refills: 5      CONTINUE these medications which have CHANGED   Details  atorvastatin (LIPITOR) 10 MG tablet Take 1 tablet (10 mg total) by mouth daily at 6 PM. Qty: 30 tablet, Refills: 5    isosorbide mononitrate (IMDUR) 60 MG 24 hr tablet Take 1 tablet (60 mg total) by mouth daily. Qty: 30 tablet, Refills: 11      CONTINUE these medications which have NOT CHANGED   Details  acetaminophen (TYLENOL) 500 MG tablet Take 1,000 mg by mouth every 6 (six) hours as needed (shoulder pain).    aspirin EC 81 MG tablet Take 1 tablet (81 mg total) by mouth daily. Qty: 30 tablet, Refills: 0    carvedilol (COREG) 3.125 MG tablet Take 1 tablet (3.125 mg total) by mouth 2 (two) times daily with a meal. Qty: 60 tablet, Refills: 0    donepezil (ARICEPT) 10 MG tablet Take 10 mg by mouth at bedtime.     glimepiride (AMARYL) 1 MG tablet Take 1 mg by mouth daily with breakfast.     linagliptin (TRADJENTA) 5 MG TABS tablet Take 1 tablet (5 mg total) by mouth daily. For blood sugar Qty: 30 tablet, Refills: 1    nitroGLYCERIN (NITROSTAT) 0.4 MG SL tablet PLACE ONE TABLET UNDER THE TONGUE EVERY 5 MINUTES AS NEEDED FOR CHEST PAIN. Qty: 25 tablet, Refills: 23    Propylene Glycol (SYSTANE BALANCE) 0.6 % SOLN Place 1-2 drops into both eyes 3 (three) times daily as needed (for dry eyes).     ranolazine (RANEXA) 1000 MG SR tablet Take 1 tablet (1,000 mg total) by mouth 2 (two) times daily. Qty: 60 tablet, Refills: 0    ipratropium-albuterol (DUONEB) 0.5-2.5 (3) MG/3ML SOLN Take 3 mLs by nebulization every 6 (six) hours as needed  (shortness of breath or wheezing). Qty: 360 mL, Refills: 0      STOP taking these medications     amLODipine (NORVASC) 2.5 MG tablet      levofloxacin (LEVAQUIN) 500 MG tablet           Outstanding Labs/Studies   Lipids and CMP in 6 weeks  Duration of Discharge Encounter   Greater than 30 minutes including physician time.  Signed, Daune Perch NP 02/04/2017, 11:40 AM

## 2017-02-04 NOTE — Plan of Care (Signed)
Problem: Education: Goal: Knowledge of [facility] Education information/materials will improve Outcome: Completed/Met Date Met: 02/04/17 Education given to family   

## 2017-02-07 ENCOUNTER — Ambulatory Visit: Payer: Medicare Other | Admitting: Hematology

## 2017-02-07 ENCOUNTER — Ambulatory Visit: Payer: Medicare Other

## 2017-02-07 ENCOUNTER — Telehealth: Payer: Self-pay

## 2017-02-07 ENCOUNTER — Other Ambulatory Visit: Payer: Medicare Other

## 2017-02-07 NOTE — Telephone Encounter (Signed)
Patient appointment for today was canceled by provider. Per 10/1 sch message

## 2017-02-08 ENCOUNTER — Ambulatory Visit: Payer: Medicare Other

## 2017-02-09 ENCOUNTER — Ambulatory Visit: Payer: Medicare Other

## 2017-02-10 ENCOUNTER — Ambulatory Visit: Payer: Medicare Other

## 2017-02-11 ENCOUNTER — Ambulatory Visit: Payer: Medicare Other

## 2017-02-12 ENCOUNTER — Ambulatory Visit: Payer: Medicare Other

## 2017-02-14 ENCOUNTER — Ambulatory Visit: Payer: Medicare Other

## 2017-02-15 ENCOUNTER — Ambulatory Visit: Payer: Medicare Other

## 2017-02-16 ENCOUNTER — Ambulatory Visit: Payer: Medicare Other

## 2017-02-17 ENCOUNTER — Ambulatory Visit: Payer: Medicare Other

## 2017-02-18 ENCOUNTER — Ambulatory Visit: Payer: Medicare Other

## 2017-02-21 ENCOUNTER — Ambulatory Visit: Payer: Medicare Other

## 2017-02-22 ENCOUNTER — Encounter: Payer: Self-pay | Admitting: Physician Assistant

## 2017-02-22 ENCOUNTER — Ambulatory Visit: Payer: Medicare Other

## 2017-02-22 ENCOUNTER — Ambulatory Visit (INDEPENDENT_AMBULATORY_CARE_PROVIDER_SITE_OTHER): Payer: Medicare Other | Admitting: Physician Assistant

## 2017-02-22 VITALS — BP 98/50 | HR 82 | Ht 69.0 in | Wt 132.8 lb

## 2017-02-22 DIAGNOSIS — I214 Non-ST elevation (NSTEMI) myocardial infarction: Secondary | ICD-10-CM | POA: Diagnosis not present

## 2017-02-22 DIAGNOSIS — I5042 Chronic combined systolic (congestive) and diastolic (congestive) heart failure: Secondary | ICD-10-CM

## 2017-02-22 DIAGNOSIS — C20 Malignant neoplasm of rectum: Secondary | ICD-10-CM

## 2017-02-22 DIAGNOSIS — I480 Paroxysmal atrial fibrillation: Secondary | ICD-10-CM | POA: Diagnosis not present

## 2017-02-22 DIAGNOSIS — I350 Nonrheumatic aortic (valve) stenosis: Secondary | ICD-10-CM

## 2017-02-22 DIAGNOSIS — Z87898 Personal history of other specified conditions: Secondary | ICD-10-CM | POA: Diagnosis not present

## 2017-02-22 DIAGNOSIS — I25119 Atherosclerotic heart disease of native coronary artery with unspecified angina pectoris: Secondary | ICD-10-CM

## 2017-02-22 MED ORDER — ISOSORBIDE MONONITRATE ER 30 MG PO TB24: 30.0000 mg | ORAL_TABLET | Freq: Every day | ORAL | 3 refills | Status: DC

## 2017-02-22 NOTE — Patient Instructions (Signed)
Medication Instructions:  1. DECREASE IMDUR TO 30 MG DAILY; NEW RX SENT IN FOR THE 30 MG TABLET  Labwork: NONE ORDERED TODAY  Testing/Procedures: NONE ORDERED TODAY  Follow-Up: Your physician wants you to follow-up in: 6 MONTHS WITH DR. [name] You will receive a reminder letter in the mail two months in advance. If you don't receive a letter, please call our office to schedule the follow-up appointment.    Any Other Special Instructions Will Be Listed Below (If Applicable).     If you need a refill on your cardiac medications before your next appointment, please call your pharmacy.

## 2017-02-22 NOTE — Progress Notes (Signed)
Cardiology Office Note:    Date:  02/22/2017   ID:  [name], DOB Dec 02, 1923, MRN [id number]  PCP:  Lavone Orn, MD  Cardiologist:  Dr. Liam Rogers    Referring MD: Lavone Orn, MD   Chief Complaint  Patient presents with  . Hospitalization Follow-up    Myocardial infarction    History of Present Illness:    [name] is a 83 y.o. male with a hx of CAD s/pprior PCI to the LAD in 2012, chronic angina, chronic PVCs/PACs, HTN, diabetes, HL, systolic CHF, PAF (failed TEE-DCCV), prior lower GI bleed, metastatic colonCA. he has had several admissions with chest discomfort.  Last cardiac catheterization in 1/18 demonstrated patent stent in the LAD and 80% stenosis in a second diagonal that was treated medically.  CHADS2-VASc= 6 (age, HTN, CHF, vascular disease, diabetes). He was admitted in May 2018 with GI bleeding and diagnosed with recurrent metastatic colon cancer.  He has no longer on anticoagulation.  Last seen in clinic by Dr. [name] 11/25/16.    For his recurrent metastatic colon cancer, he was started on palliative radiation and Xeloda.  He was admitted in 8/18 for syncope.  Echocardiogram demonstrated worsening LV function with an EF approximately 40% with moderate aortic stenosis.  Outpatient event monitor was suggested by cardiology at that time to assess for ventricular tachycardia correlating with syncope.  It was suggested that amiodarone could be considered if this was demonstrated. He was admitted in early September 2018 with right upper lobe pneumonia.  He was admitted 9/26-9/28 with a non-ST elevation myocardial infarction.  The patient has a known 80% stenosis in the second diagonal that was not treated at his last cardiac catheterization in 05/2016.  Because of metastatic colon cancer and advanced age, conservative management with medical therapy was pursued.  Nitrate dose was increased.  [name] returns for posthospitalization follow-up.  He is here  today with his nephew and sister.  He has not had much chest discomfort since discharge from the hospital.  He took nitroglycerin on one occasion.  He does complain of fatigue.  He denies dizziness or near syncope.  He denies paroxysmal nocturnal dyspnea or lower extremity edema.  He does get out of breath on occasion.  He ambulates through his house with a walker.  He continues to have occasional rectal bleeding.  However, this is been fairly stable.  He has decided to pursue palliative care and has stopped all radiation and chemotherapy treatments.  Prior CV studies:   The following studies were reviewed today:  Echocardiogram 01/03/17 EF 35-40, normal wall motion, grade 1 diastolic dysfunction, moderate aortic stenosis (mean 21, peak 38; LVOT/AV mean velocity ratio 0.41), trivial MR, trivial TR  Echocardiogram 10/15/16 EF 50-55, normal wall motion, grade 1 diastolic dysfunction, moderate aortic stenosis (mean 20, peak 34; LVOT/AV mean velocity ratio 0.26), moderate LAE, mild RAE  Holter 10/15/16 Normal sinus rhythm, occasional PACs, occasional PVCs  Myoview 09/01/16 EF 48, PVCs, inferolateral infarct, no ischemia; intermediate risk  LHC 05/20/16 LAD proximal stent patent, D2 80 RCA ostial 50 Normal EF  Echo 11/15/27 Normal systolic function, grade 1 diastolic dysfunction, mild aortic stenosis (mean 13, peak 22), RA band cor triatriatum  Carotid US 11/04/15 Bilateral - 1% to 39% ICA stenosis. Vertebral artery flow was antegrade.  LHC 4/15 LM ok  LAD patent. Mid and dist 30, D1 80 prox LCx <20 PDA ostial 60 EF55-65  Myoview 8/14 No ischemia, EF 47  Past Medical History:  Diagnosis Date  . Aortic stenosis, moderate cardiolgosit-  dr Cathie Olden   per last echo 10-15-2016 aortic stenosis somewhat worse since last echo 11-14-2015 moderate calcification and thickened leaflets, valve area 1.01cm^2,  mead gradiant 90mmHg, peak grandiant 61mmHgf  . Barrett esophagus dx 2015  . Chronic  stable angina ([place])   . Chronic systolic CHF (congestive heart failure) ([place]) followed by dr Cathie Olden   a. EF previously 35-40%. 03/ 2013 b. improved to 55-65% by cath 08/2013./  per echo 06/ 2018 ef 50-55%  . CKD (chronic kidney disease), stage III ([place])   . Constipation   . Coronary artery disease cardiologist-- dr Cathie Olden   a. s/p PCI w/ DES to mLAD 08/05/10. b. NSTEMI 08/2013 (mildly elev trop) - cath showing widely patent stent, 80% prox D1 (small vessel) with recommendation for medical management, residual 60% ostial PDA, <20% LCx, LVEF 55-65%.   . Depression   . History of colon cancer, stage III oncologist- dr Waymon Budge--  per last note no recurrence 2013   dx 10/ 1992,  Stage III-- treament post right colecotmy, chemoradiation/  1993 localized recurrent mesenteric mass -- treatment post resection mass and chemoradiation  . History of kidney stones   . History of non-ST elevation myocardial infarction (NSTEMI)    08-14-2013  . History of small bowel obstruction    03/ 2005 partial SBO due to adhesions  post lysis adhesions and small bowel resection  . Hyperlipidemia   . Hypertension   . Macrocytic anemia dx 1992   chronic  . PAF (paroxysmal atrial fibrillation) [facility]) cardiologist-  dr Cathie Olden   a. failed TEE due to inability to pass probe into the esophagus in March 2013. b. On Coumadin. Was in NSR 08/2013 admission.  . Premature atrial contractions   . Premature ventricular contractions   . RBBB (right bundle branch block)   . Rectal mass   . S/P drug eluting coronary stent placement 08/05/2010   DES x1  to midLAD  . Type II diabetes mellitus ([place])   . Wears glasses     Past Surgical History:  Procedure Laterality Date  . ABDOMINAL MASS RESECTION  11/1991   localized recurrent colon cancer mesenteric lymph node mass (area of distal aorta and vena cava) and debulking  . CARDIAC CATHETERIZATION  08/03/10   dr Lia Foyer   preserved LVSF, mod. high grade stenosis LAD, diagonal  stenosis at the bend 60-70%  . CARDIAC CATHETERIZATION  08-09-2016  dr Martinique   ostialLAD 30%, wide patent mLAD stent, pCFX 40%,  mild irregularities throughout RCA < 20%  . CARDIAC CATHETERIZATION N/A 05/20/2016   Procedure: Left Heart Cath and Coronary Angiography;  Surgeon: Lorretta Harp, MD;  Location: [facility];  Service: Cardiovascular;  Laterality: N/A; normal LVF,  D2 80%.  ostRCA 50%,  LAD stent patent  . CARDIOVASCULAR STRESS TEST  09-01-2016   dr Cathie Olden   Intermediate risk nuclear study w/ medium non-reversible defect of moderate severity in the basal inferolateral and mid inferolateral location/ no ST segment deviation noted during stress,  nuclear stress ef 48% (45-54%)  . CATARACT EXTRACTION W/ INTRAOCULAR LENS  IMPLANT, BILATERAL    . CORONARY ANGIOPLASTY WITH STENT PLACEMENT  08/05/10   dr Lia Foyer   PCI and DES x1  LAD  . CYSTO/  URETEROSCOPIC STONE EXTRACTION Left 10/18/2002  . CYSTOSCOPY WITH RETROGRADE PYELOGRAM, URETEROSCOPY AND STENT PLACEMENT Bilateral 05/18/2013   Procedure: CYSTOSCOPY WITH BILATERAL RETROGRADE PYELOGRAM, LEFT URETEROSCOPY AND LEFT STENT PLACEMENT;  Surgeon:  Alexis Frock, MD;  Location: [facility];  Service: Urology;  Laterality: Bilateral;  . EXPLORATORY LAPAROTOMY W/ LYSIS ADHESIONS AND SMALL BOWEL RESECTION  07/09/2003   partial SBO  . FLEXIBLE SIGMOIDOSCOPY N/A 10/07/2016   Procedure: FLEXIBLE SIGMOIDOSCOPY;  Surgeon: Wilford Corner, MD;  Location: [facility] ENDOSCOPY;  Service: Endoscopy;  Laterality: N/A;  . LAPAROSCOPIC CHOLECYSTECTOMY  12-04-2009  dr Margot Chimes  . LEFT HEART CATHETERIZATION WITH CORONARY ANGIOGRAM N/A 08/15/2013   Procedure: LEFT HEART CATHETERIZATION WITH CORONARY ANGIOGRAM;  Surgeon: Peter M Martinique, MD;  Location: [facility] CATH LAB;  Service: Cardiovascular;  Laterality: N/A; single vessel obstructive CAD involving a small diagonal branch, patent LAD stent , normal LVF  . RIGHT COLECTOMY  03/1991   w/ appendectomy  . TONSILLECTOMY    .  TRANSTHORACIC ECHOCARDIOGRAM  10-15-2016  dr Cathie Olden   grade 1 diastolic dysfunction, ef 16-10%/  moderate AV stenosis (valve area 1.012cm^2, mean gradiant 96mmHg, peak grandiant 35mmHg)/ moderate LAE/  trivial PR/  mild RAE    Current Medications: Current Meds  Medication Sig  . acetaminophen (TYLENOL) 500 MG tablet Take 1,000 mg by mouth every 6 (six) hours as needed (shoulder pain).  [name] aspirin EC 81 MG tablet Take 1 tablet (81 mg total) by mouth daily.  [name] atorvastatin (LIPITOR) 10 MG tablet Take 1 tablet (10 mg total) by mouth daily at 6 PM.  . carvedilol (COREG) 3.125 MG tablet Take 1 tablet (3.125 mg total) by mouth 2 (two) times daily with a meal.  . glimepiride (AMARYL) 1 MG tablet Take 1 mg by mouth daily with breakfast.   . losartan (COZAAR) 25 MG tablet Take 0.5 tablets (12.5 mg total) by mouth daily.  . nitroGLYCERIN (NITROSTAT) 0.4 MG SL tablet PLACE ONE TABLET UNDER THE TONGUE EVERY 5 MINUTES AS NEEDED FOR CHEST PAIN.  [name] Propylene Glycol (SYSTANE BALANCE) 0.6 % SOLN Place 1-2 drops into both eyes 3 (three) times daily as needed (for dry eyes).   . ranolazine (RANEXA) 1000 MG SR tablet Take 1 tablet (1,000 mg total) by mouth 2 (two) times daily.  . [DISCONTINUED] isosorbide mononitrate (IMDUR) 60 MG 24 hr tablet Take 1 tablet (60 mg total) by mouth daily.     Allergies:   Metformin and related   Social History   Social History  . Marital status: Single    Spouse name: N/A  . Number of children: 0  . Years of education: N/A   Occupational History  . Retired    Social History Main Topics  . Smoking status: Former Smoker    Packs/day: 1.00    Years: 39.00    Types: Cigarettes    Quit date: 05/10/1992  . Smokeless tobacco: Never Used  . Alcohol use 1.2 oz/week    2 Shots of liquor per week     Comment: occasionally.  . Drug use: No  . Sexual activity: No   Other Topics Concern  . None   Social History Narrative   Lives alone.       Family Hx: The patient's  family history includes Cancer in his mother; Heart attack (age of onset: 14) in his brother; Ulcers (age of onset: 73) in his father.  ROS:   Please see the history of present illness.    ROS All other systems reviewed and are negative.   EKGs/Labs/Other Test Reviewed:    EKG:  EKG is  ordered today.  The ekg ordered today demonstrates normal sinus rhythm, HR 82, frequent PVCs, RBBB, QTC 500  ms, similar to prior tracings  Recent Labs: 05/20/2016: TSH 2.691 01/01/2017: B Natriuretic Peptide 440.2 01/02/2017: Magnesium 2.0 01/11/2017: ALT 23 02/04/2017: BUN 17; Creatinine, Ser 1.02; Hemoglobin 9.6; Platelets 143; Potassium 3.8; Sodium 137   Recent Lipid Panel Lab Results  Component Value Date/Time   CHOL 120 02/04/2017 04:08 AM   TRIG 58 02/04/2017 04:08 AM   HDL 38 (L) 02/04/2017 04:08 AM   CHOLHDL 3.2 02/04/2017 04:08 AM   LDLCALC 70 02/04/2017 04:08 AM    Physical Exam:    VS:  BP (!) 98/50   Pulse 82   Ht 5\' 9"  (1.753 m)   Wt 132 lb 12.8 oz (60.2 kg)   SpO2 97%   BMI 19.61 kg/m     Wt Readings from Last 3 Encounters:  02/22/17 132 lb 12.8 oz (60.2 kg)  02/04/17 127 lb 3.2 oz (57.7 kg)  01/11/17 137 lb 14.4 oz (62.6 kg)     Physical Exam  Constitutional: He is oriented to person, place, and time. He appears well-developed and well-nourished. He appears cachectic.  HENT:  Head: Normocephalic and atraumatic.  Neck: No JVD (At 90 degrees) present.  Cardiovascular: Normal rate and regular rhythm.   Murmur heard.  Systolic murmur is present with a grade of 2/6  at the lower left sternal border Pulmonary/Chest: Effort normal. He has no rales.  Abdominal: Soft.  Musculoskeletal: He exhibits no edema.  Neurological: He is alert and oriented to person, place, and time.  Skin: Skin is warm and dry.  Psychiatric: He has a normal mood and affect.    ASSESSMENT:    1. NSTEMI (non-ST elevated myocardial infarction) ([place])   2. Coronary artery disease involving native  coronary artery of native heart with angina pectoris ([place])   3. Chronic combined systolic and diastolic CHF (congestive heart failure) ([facility])   4. Paroxysmal atrial fibrillation ([place])   5. Aortic valve stenosis, etiology of cardiac valve disease unspecified   6. History of syncope   7. Rectal cancer ([place])    PLAN:    In order of problems listed above:  1.  NSTEMI (non-ST elevated myocardial infarction) [facility]) Recent admission with non-ST elevation myocardial infarction.  Peak troponin 0.82.  Cardiac catheterization in 1/18 demonstrated patent LAD stent and 80% second diagonal stenosis.  Given his advanced malignancy and decision to pursue palliative care, he has been treated medically.  He is currently doing well with minimal angina.  Continue aspirin, statin, ARB, nitrates, ranolazine  2.  Coronary artery disease involving native coronary artery of native heart without angina pectoris As noted, he is being treated medically.  He has minimal angina on his current medical regimen.  He does complain of fatigue which is probably multifactorial.  However, his blood pressure is 98/50.  -  Decrease isosorbide to 30 mg daily.  -  If angina escalates on low-dose nitrate, can resume isosorbide 60 mg daily and DC ARB  3. Chronic combined systolic and diastolic CHF (congestive heart failure) ([place]) Most recent echocardiogram demonstrated EF approximately 40%.  He is being treated conservatively.  Volume appears stable.  He is not on diuretic therapy.  Continue beta-blocker, ARB, nitrates.  If blood pressure continues to run low, consider discontinuation of losartan.  4.  Paroxysmal atrial fibrillation ([facility]) Maintaining normal sinus rhythm.  He is no longer on anticoagulation secondary to rectal bleeding from recurrent malignancy.  5. Aortic valve stenosis, etiology of cardiac valve disease unspecified Moderate by most recent echocardiogram.  He is not  a candidate for surgical valve replacement by either  SAVR or TAVR.  6. History of syncope As he has elected for palliative care for his malignancy, he does not require further cardiac workup.  7.  Rectal cancer ([place]) As noted, he has elected to stop radiation and chemotherapy and is pursuing palliative care.   Dispo:  Return in about 6 months (around 08/23/2017) for Routine Follow Up, w/ Dr. [name].   Medication Adjustments/Labs and Tests Ordered: Current medicines are reviewed at length with the patient today.  Concerns regarding medicines are outlined above.  Tests Ordered: Orders Placed This Encounter  Procedures  . EKG 12-Lead   Medication Changes: Meds ordered this encounter  Medications  . isosorbide mononitrate (IMDUR) 30 MG 24 hr tablet    Sig: Take 1 tablet (30 mg total) by mouth daily.    Dispense:  90 tablet    Refill:  3    Signed, Richardson Dopp, PA-C  02/22/2017 3:32 PM    [facility] [place], [place], [place]  [address] Phone: [phone]; Fax: [phone]

## 2017-02-23 ENCOUNTER — Ambulatory Visit: Payer: Medicare Other

## 2017-02-28 ENCOUNTER — Ambulatory Visit: Payer: Medicare Other | Admitting: Cardiovascular Disease

## 2017-03-01 ENCOUNTER — Telehealth: Payer: Self-pay | Admitting: Cardiovascular Disease

## 2017-03-01 MED ORDER — ISOSORBIDE MONONITRATE ER 30 MG PO TB24: 60.0000 mg | ORAL_TABLET | Freq: Every day | ORAL | 3 refills | Status: AC

## 2017-03-01 NOTE — Telephone Encounter (Signed)
Spoke with Mendel Ryder, nurse with hospice who states patient is unable to swallow Ranexa due to the pill size. She states someone has been breaking the pill into 4 pieces for the patient and she is concerned that it is no longer effective. I asked if the patient would be opposed to stopping the medication and she states she does not believe that he would. I spoke with Dr. [name] who is in the office and he advised that we can increase patient's isosorbide to 60 mg daily. Mendel Ryder asked that we prescribe 2 of the isosorbide 30 mg tablets since they are smaller and easier to swallow. I changed patient's Rx and she thanked me for my help.

## 2017-03-01 NOTE — Telephone Encounter (Signed)
New message   Call from  [place] from [place] [phone]. Needs medication alternative. Also wants to report BP is 100/50 today.  Pt c/o medication issue:  1. Name of Medication:  Renexa  2. How are you currently taking this medication (dosage and times per day)? As prescribed  3. Are you having a reaction (difficulty breathing--STAT)? no 4. What is your medication issue? Unable to swallow medication

## 2017-04-09 DEATH — deceased

## 2017-04-25 ENCOUNTER — Other Ambulatory Visit (HOSPITAL_COMMUNITY): Payer: Medicare Other

## 2017-05-10 DEATH — deceased

## 2019-03-03 IMAGING — CR DG SHOULDER 2+V*L*
3 series · 3 of 3 positions shown · non-contrast
Comparison: Radiographs August 2015, MRI September 2015

CLINICAL DATA: Left shoulder pain.

EXAM:
LEFT SHOULDER - 2+ VIEW

[shoulder grashey]
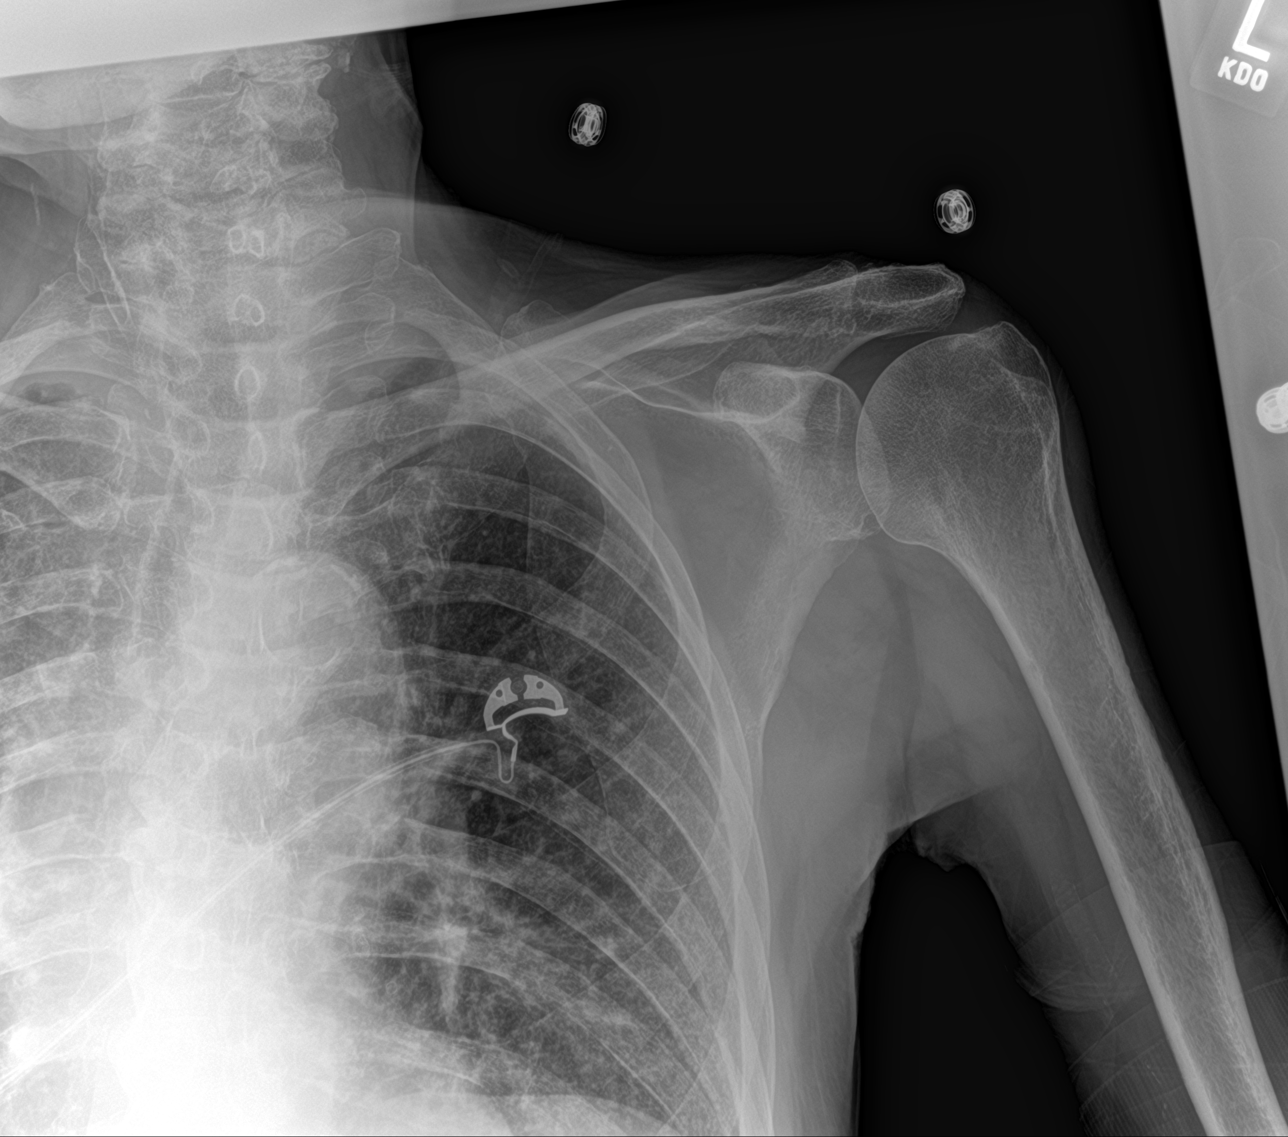

[shoulder y view]
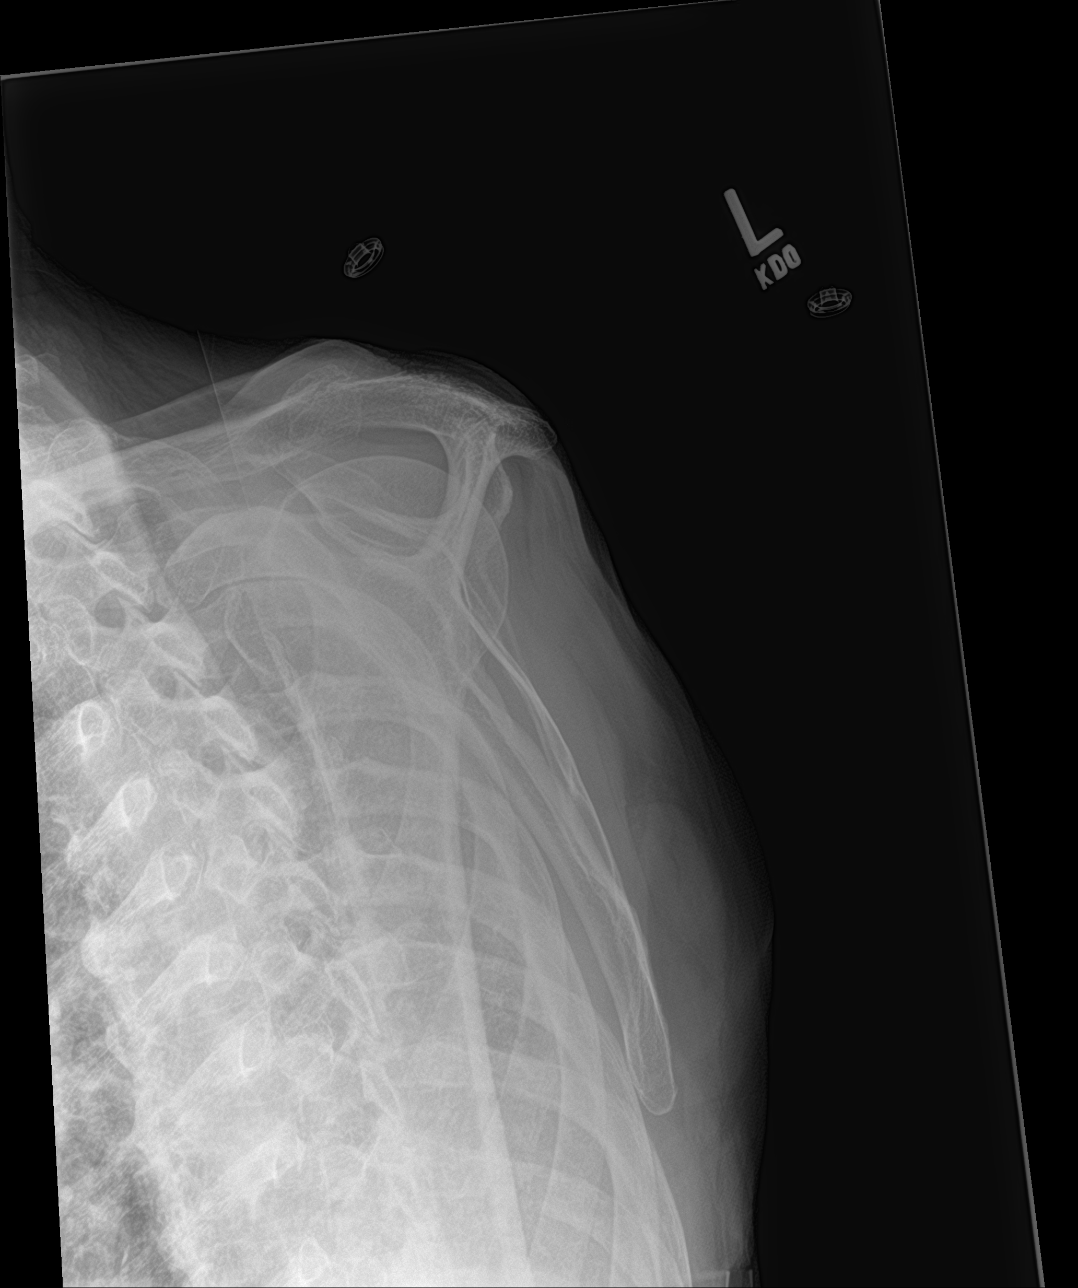

[shoulder ap neutral]
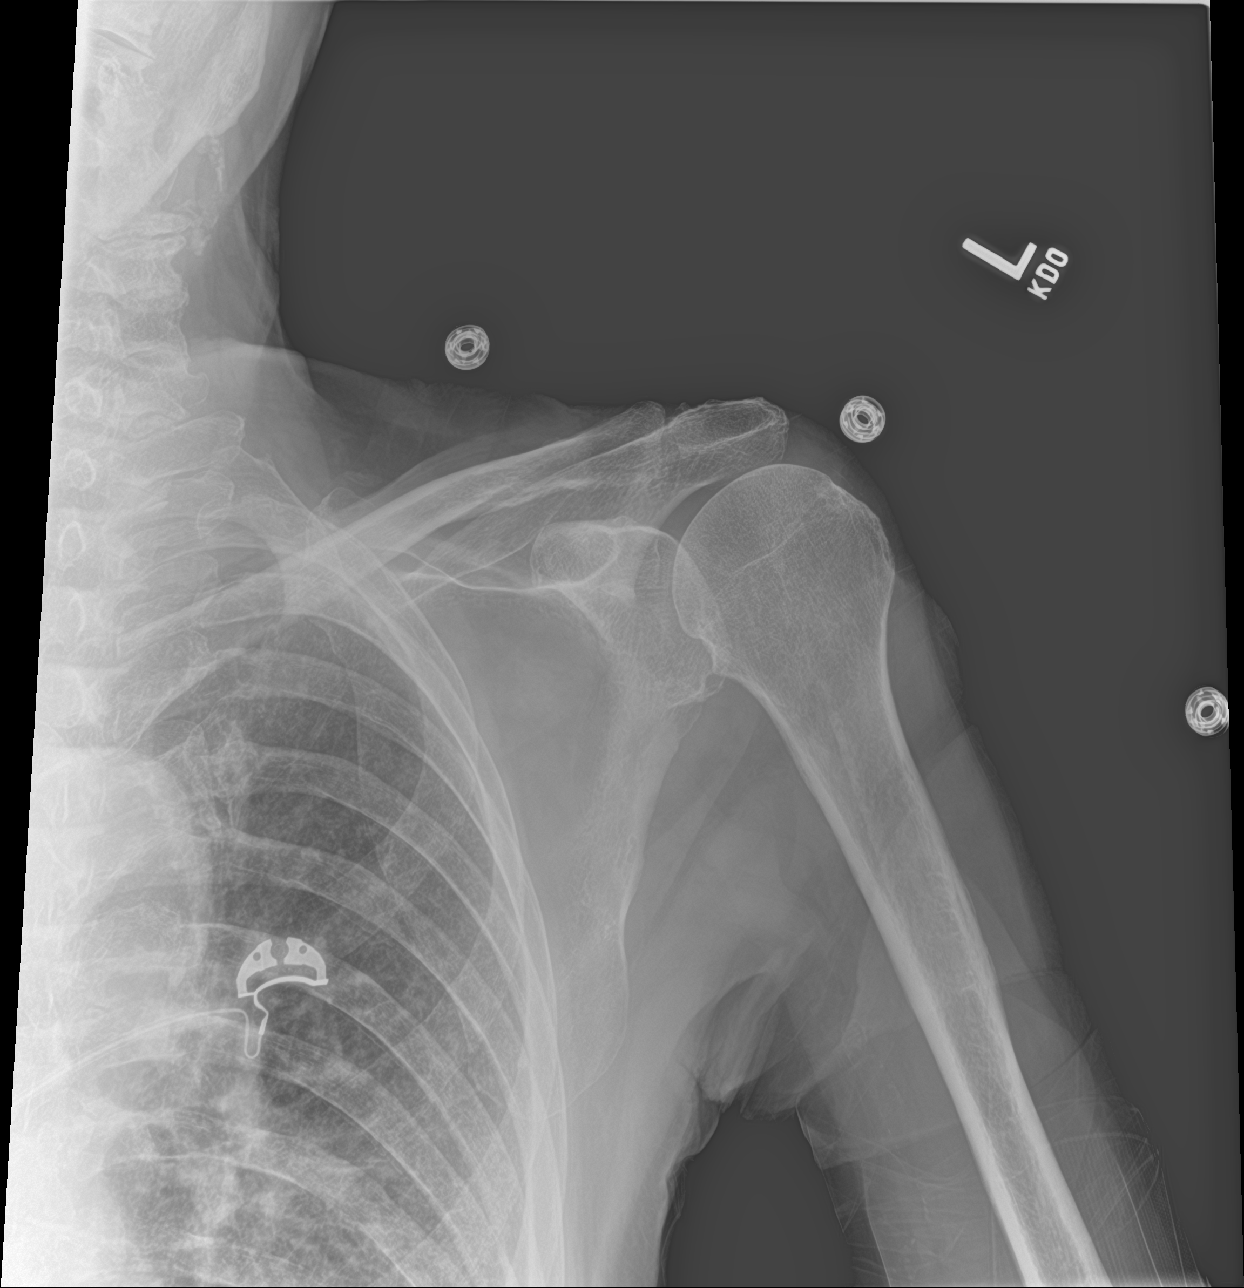

[3 of 3 positions shown; findings below may reference images not displayed]

FINDINGS: There is no evidence of fracture or dislocation. Acromioclavicular
osteoarthritis, unchanged. Minimal subcortical irregularity of the
greater tuberosity consistent with rotator cuff pathology. Soft
tissues are unremarkable.
IMPRESSION: Acromioclavicular osteoarthritis.  No acute osseous abnormality.
